# Patient Record
Sex: Female | Born: 1975 | Race: Black or African American | Hispanic: No | Marital: Single | Smoking: Never smoker
Health system: Southern US, Community
[De-identification: ages and names within clinical notes are randomized; demographics above are authoritative.]

## PROBLEM LIST (undated history)

## (undated) DIAGNOSIS — T7840XA Allergy, unspecified, initial encounter: Secondary | ICD-10-CM

## (undated) DIAGNOSIS — K219 Gastro-esophageal reflux disease without esophagitis: Secondary | ICD-10-CM

## (undated) DIAGNOSIS — R0600 Dyspnea, unspecified: Secondary | ICD-10-CM

## (undated) DIAGNOSIS — Z8679 Personal history of other diseases of the circulatory system: Secondary | ICD-10-CM

## (undated) DIAGNOSIS — M329 Systemic lupus erythematosus, unspecified: Secondary | ICD-10-CM

## (undated) DIAGNOSIS — E785 Hyperlipidemia, unspecified: Secondary | ICD-10-CM

## (undated) DIAGNOSIS — N2581 Secondary hyperparathyroidism of renal origin: Secondary | ICD-10-CM

## (undated) DIAGNOSIS — R51 Headache: Secondary | ICD-10-CM

## (undated) DIAGNOSIS — I10 Essential (primary) hypertension: Secondary | ICD-10-CM

## (undated) DIAGNOSIS — M1990 Unspecified osteoarthritis, unspecified site: Secondary | ICD-10-CM

## (undated) DIAGNOSIS — F329 Major depressive disorder, single episode, unspecified: Secondary | ICD-10-CM

## (undated) DIAGNOSIS — F32A Depression, unspecified: Secondary | ICD-10-CM

## (undated) DIAGNOSIS — J189 Pneumonia, unspecified organism: Secondary | ICD-10-CM

## (undated) DIAGNOSIS — M8700 Idiopathic aseptic necrosis of unspecified bone: Secondary | ICD-10-CM

## (undated) DIAGNOSIS — N186 End stage renal disease: Secondary | ICD-10-CM

## (undated) DIAGNOSIS — Z992 Dependence on renal dialysis: Secondary | ICD-10-CM

## (undated) DIAGNOSIS — F419 Anxiety disorder, unspecified: Secondary | ICD-10-CM

## (undated) DIAGNOSIS — I639 Cerebral infarction, unspecified: Secondary | ICD-10-CM

## (undated) DIAGNOSIS — D649 Anemia, unspecified: Secondary | ICD-10-CM

## (undated) HISTORY — PX: OTHER SURGICAL HISTORY: SHX169

## (undated) HISTORY — PX: INCISION AND DRAINAGE ABSCESS: SHX5864

## (undated) HISTORY — PX: AV FISTULA PLACEMENT: SHX1204

## (undated) HISTORY — DX: Allergy, unspecified, initial encounter: T78.40XA

## (undated) HISTORY — DX: Gastro-esophageal reflux disease without esophagitis: K21.9

---
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
## 2001-08-13 ENCOUNTER — Other Ambulatory Visit: Admission: RE | Admit: 2001-08-13 | Discharge: 2001-08-13 | Payer: Self-pay | Admitting: Obstetrics and Gynecology

## 2002-12-09 ENCOUNTER — Emergency Department (HOSPITAL_COMMUNITY): Admission: EM | Admit: 2002-12-09 | Discharge: 2002-12-09 | Payer: Self-pay | Admitting: Emergency Medicine

## 2002-12-25 ENCOUNTER — Emergency Department (HOSPITAL_COMMUNITY): Admission: EM | Admit: 2002-12-25 | Discharge: 2002-12-25 | Payer: Self-pay | Admitting: Emergency Medicine

## 2002-12-25 ENCOUNTER — Encounter: Payer: Self-pay | Admitting: Emergency Medicine

## 2003-11-10 ENCOUNTER — Inpatient Hospital Stay (HOSPITAL_COMMUNITY): Admission: EM | Admit: 2003-11-10 | Discharge: 2003-12-01 | Payer: Self-pay | Admitting: Emergency Medicine

## 2003-11-11 ENCOUNTER — Encounter: Payer: Self-pay | Admitting: Cardiology

## 2003-11-25 ENCOUNTER — Encounter: Payer: Self-pay | Admitting: Internal Medicine

## 2003-12-01 ENCOUNTER — Inpatient Hospital Stay (HOSPITAL_COMMUNITY)
Admission: RE | Admit: 2003-12-01 | Discharge: 2004-01-04 | Payer: Self-pay | Admitting: Physical Medicine & Rehabilitation

## 2003-12-30 ENCOUNTER — Encounter: Payer: Self-pay | Admitting: Physical Medicine & Rehabilitation

## 2004-01-04 ENCOUNTER — Inpatient Hospital Stay (HOSPITAL_COMMUNITY): Admission: EM | Admit: 2004-01-04 | Discharge: 2004-01-14 | Payer: Self-pay | Admitting: Internal Medicine

## 2004-01-20 ENCOUNTER — Inpatient Hospital Stay (HOSPITAL_COMMUNITY): Admission: EM | Admit: 2004-01-20 | Discharge: 2004-02-05 | Payer: Self-pay | Admitting: Emergency Medicine

## 2004-03-27 ENCOUNTER — Ambulatory Visit (HOSPITAL_COMMUNITY): Admission: RE | Admit: 2004-03-27 | Discharge: 2004-03-27 | Payer: Self-pay | Admitting: Rheumatology

## 2004-04-13 ENCOUNTER — Encounter: Admission: RE | Admit: 2004-04-13 | Discharge: 2004-04-13 | Payer: Self-pay | Admitting: Rheumatology

## 2004-06-13 ENCOUNTER — Other Ambulatory Visit: Admission: RE | Admit: 2004-06-13 | Discharge: 2004-06-13 | Payer: Self-pay | Admitting: Internal Medicine

## 2004-07-05 ENCOUNTER — Inpatient Hospital Stay (HOSPITAL_COMMUNITY): Admission: EM | Admit: 2004-07-05 | Discharge: 2004-07-23 | Payer: Self-pay | Admitting: Emergency Medicine

## 2004-07-06 ENCOUNTER — Encounter (INDEPENDENT_AMBULATORY_CARE_PROVIDER_SITE_OTHER): Payer: Self-pay | Admitting: Cardiology

## 2004-07-06 ENCOUNTER — Encounter (INDEPENDENT_AMBULATORY_CARE_PROVIDER_SITE_OTHER): Payer: Self-pay | Admitting: Specialist

## 2004-07-14 ENCOUNTER — Encounter (INDEPENDENT_AMBULATORY_CARE_PROVIDER_SITE_OTHER): Payer: Self-pay | Admitting: *Deleted

## 2004-08-02 ENCOUNTER — Ambulatory Visit: Payer: Self-pay | Admitting: Internal Medicine

## 2004-09-06 ENCOUNTER — Encounter: Admission: RE | Admit: 2004-09-06 | Discharge: 2004-09-06 | Payer: Self-pay | Admitting: Thoracic Surgery

## 2004-11-27 ENCOUNTER — Ambulatory Visit (HOSPITAL_COMMUNITY): Admission: RE | Admit: 2004-11-27 | Discharge: 2004-11-27 | Payer: Self-pay | Admitting: Nephrology

## 2004-11-29 ENCOUNTER — Ambulatory Visit (HOSPITAL_COMMUNITY): Admission: RE | Admit: 2004-11-29 | Discharge: 2004-11-29 | Payer: Self-pay | Admitting: Nephrology

## 2005-01-09 ENCOUNTER — Encounter (INDEPENDENT_AMBULATORY_CARE_PROVIDER_SITE_OTHER): Payer: Self-pay | Admitting: *Deleted

## 2005-01-09 ENCOUNTER — Inpatient Hospital Stay (HOSPITAL_COMMUNITY): Admission: RE | Admit: 2005-01-09 | Discharge: 2005-01-11 | Payer: Self-pay | Admitting: Urology

## 2005-04-25 ENCOUNTER — Encounter (HOSPITAL_COMMUNITY): Admission: RE | Admit: 2005-04-25 | Discharge: 2005-07-24 | Payer: Self-pay | Admitting: Nephrology

## 2005-07-27 ENCOUNTER — Encounter (HOSPITAL_COMMUNITY): Admission: RE | Admit: 2005-07-27 | Discharge: 2005-10-25 | Payer: Self-pay | Admitting: Nephrology

## 2005-08-22 IMAGING — XA IR CV CATH FLUORO GUIDE
1 series · 2 of 2 positions shown · non-contrast
Comparison: none

CLINICAL DATA: Lupus, sepsis, needs access. 
 UPPER EXTREMITY PICC PLACEMENT WITH ULTRASOUND AND FLUORO GUIDANCE
TECHNIQUE: The right arm was prepped with Betadine, draped in the usual sterile fashion, and infiltrated locally with 1% lidocaine.  Ultrasound demonstrated patency of the basilic vein.  Under real-time ultrasound guidance, this vein was accessed with a 21-gauge micropuncture needle.  Ultrasound image documentation was performed.  The needle was exchanged over a guidewire for a peel-away sheath, through which a 5 french double lumen PICC catheter trimmed to 41 cm was advanced, positioned with its tip at the distal SVC/right atrial junction.  Fluoroscopy during the procedure and fluoro spot radiograph confirms appropriate catheter position.  The catheter was flushed, secured to the skin with Prolene sutures, and covered with a sterile dressing.   No immediate complications.  
 IMPRESSION
 Technically successful right arm PICC placement with ultrasound and fluoroscopic guidance.  Ready for routine use.

[Series 1: run · 2 of 2 slices shown]
[im 1/2]
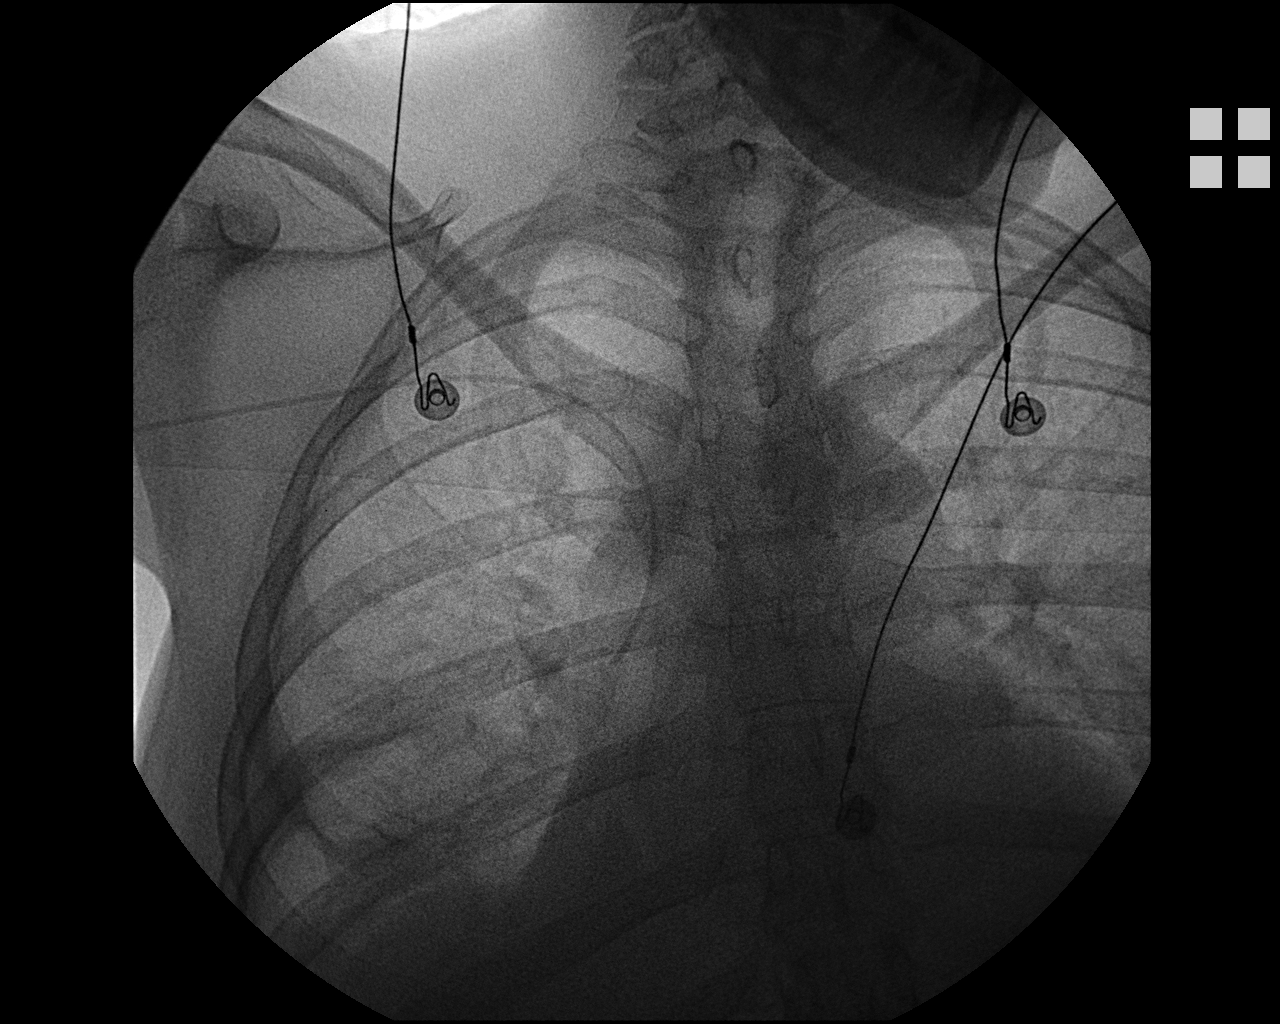
[im 2/2]
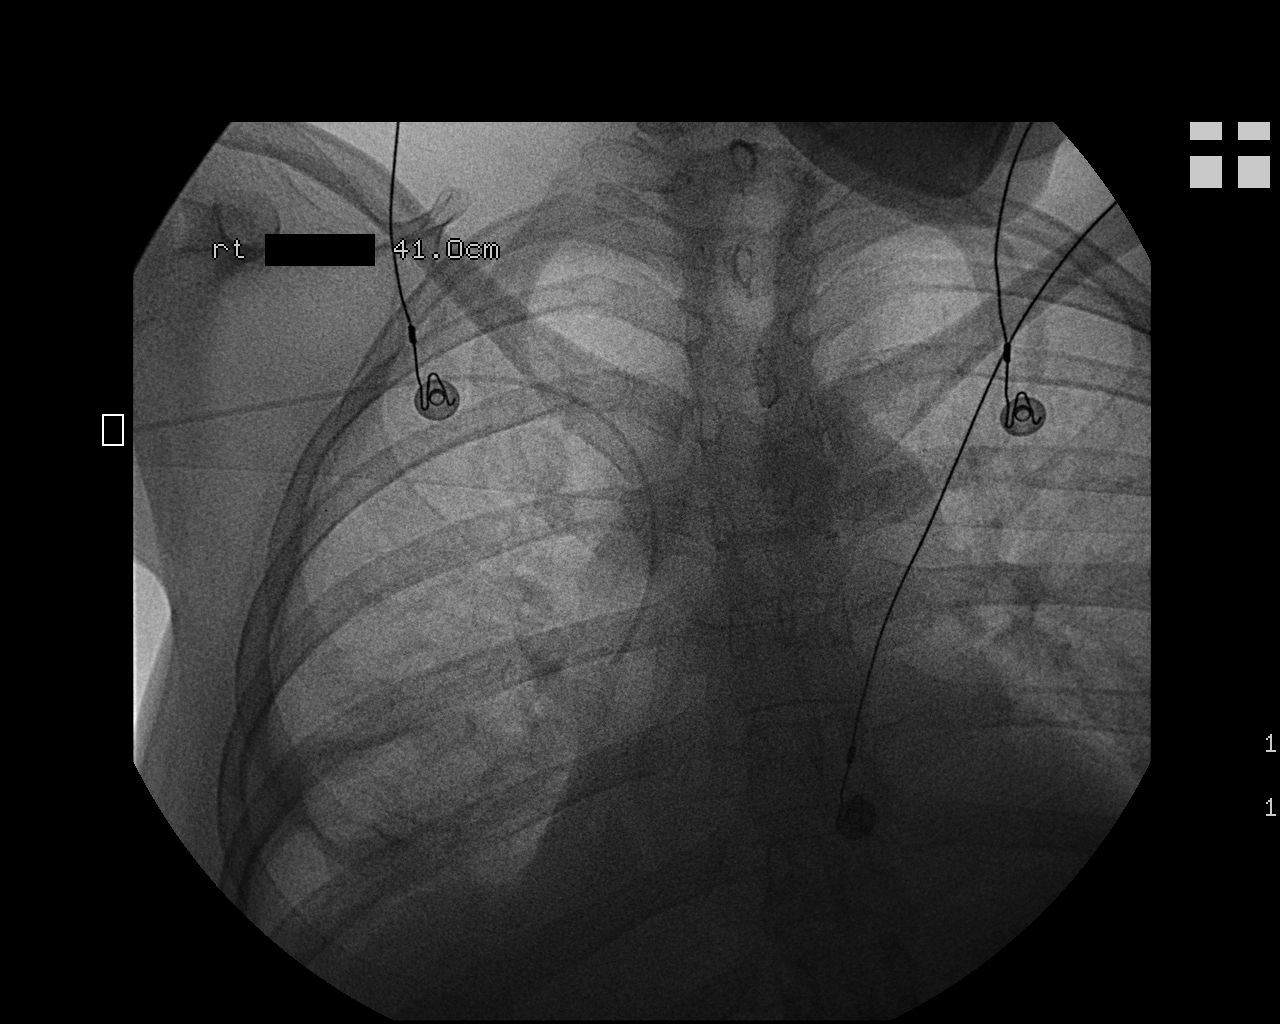

[2 of 2 positions shown; findings below may reference images not displayed]

## 2005-08-25 IMAGING — CR DG CHEST 2V
2 series · 2 of 2 positions shown · non-contrast
Comparison: 12/30/03.

CLINICAL DATA: History of fever.  Pneumonia.  Lupus. 
PA AND LATERAL CHEST FOLLOWED BY BILATERAL DECUBITUS VIEWS

[view not recorded (1 of 2)]
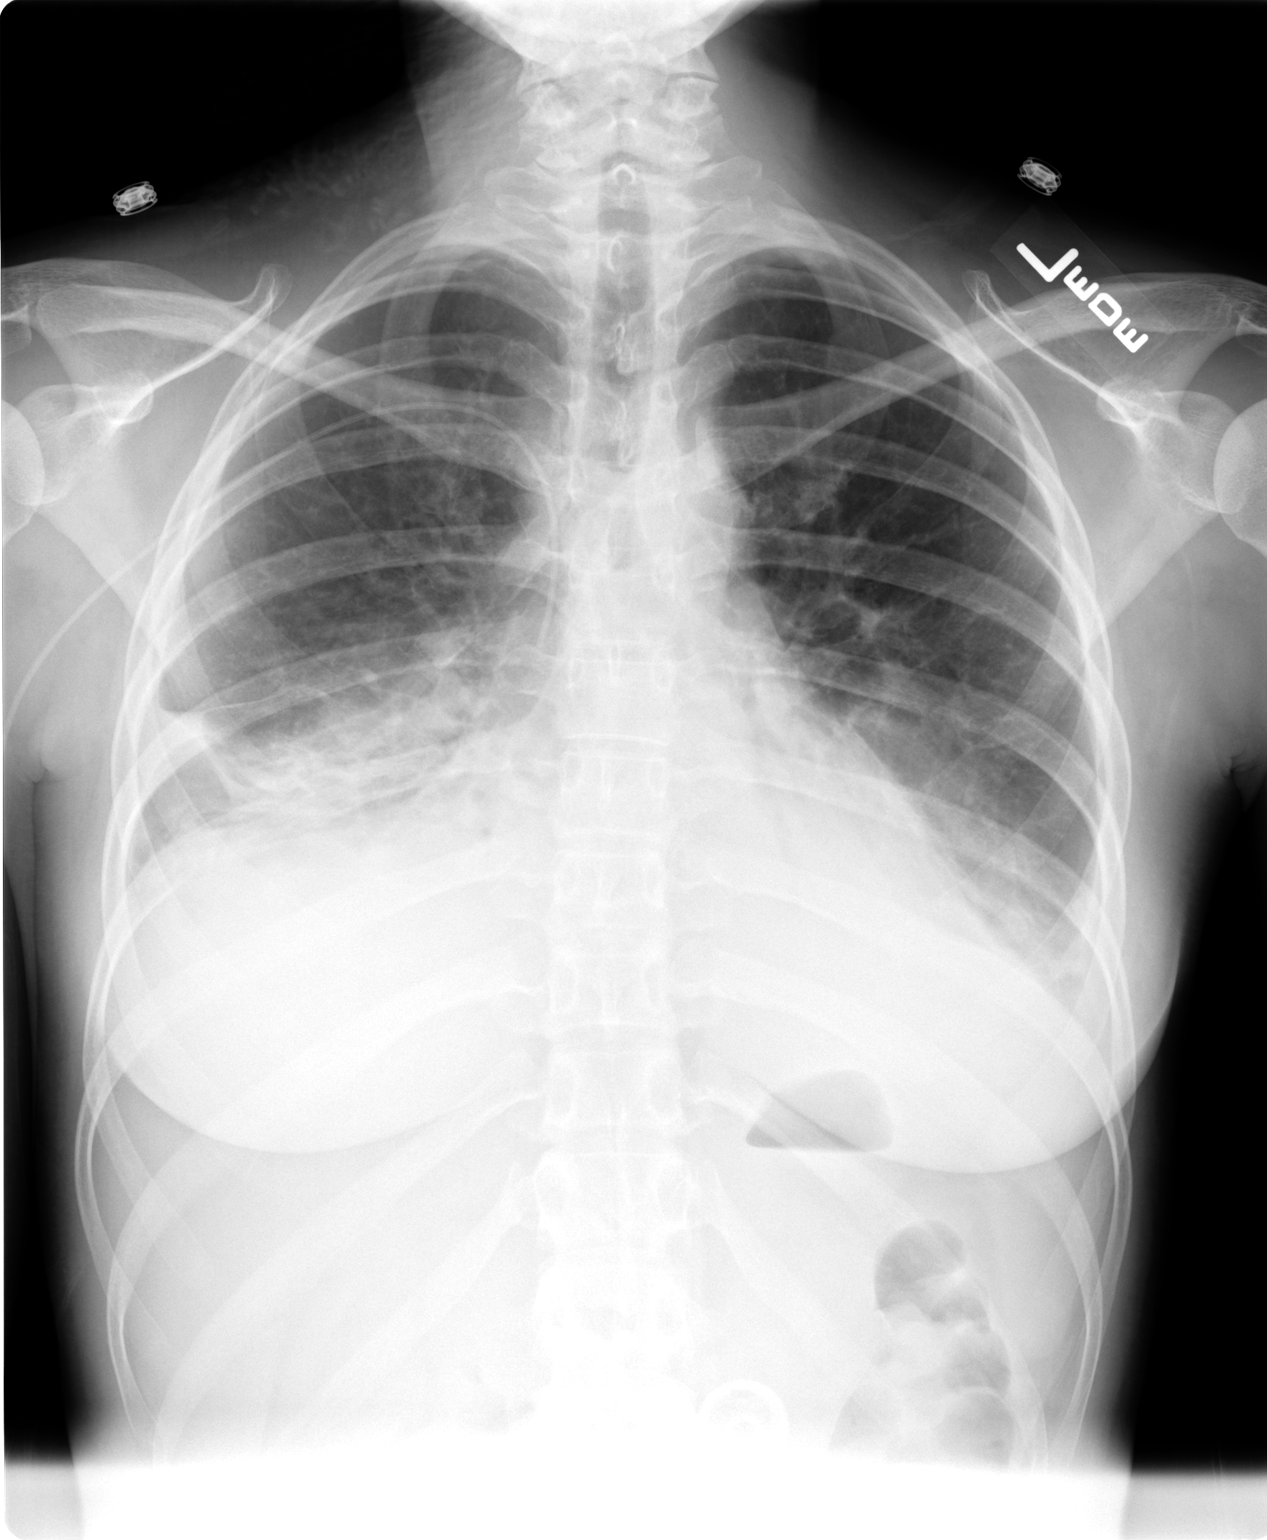

[view not recorded (2 of 2)]
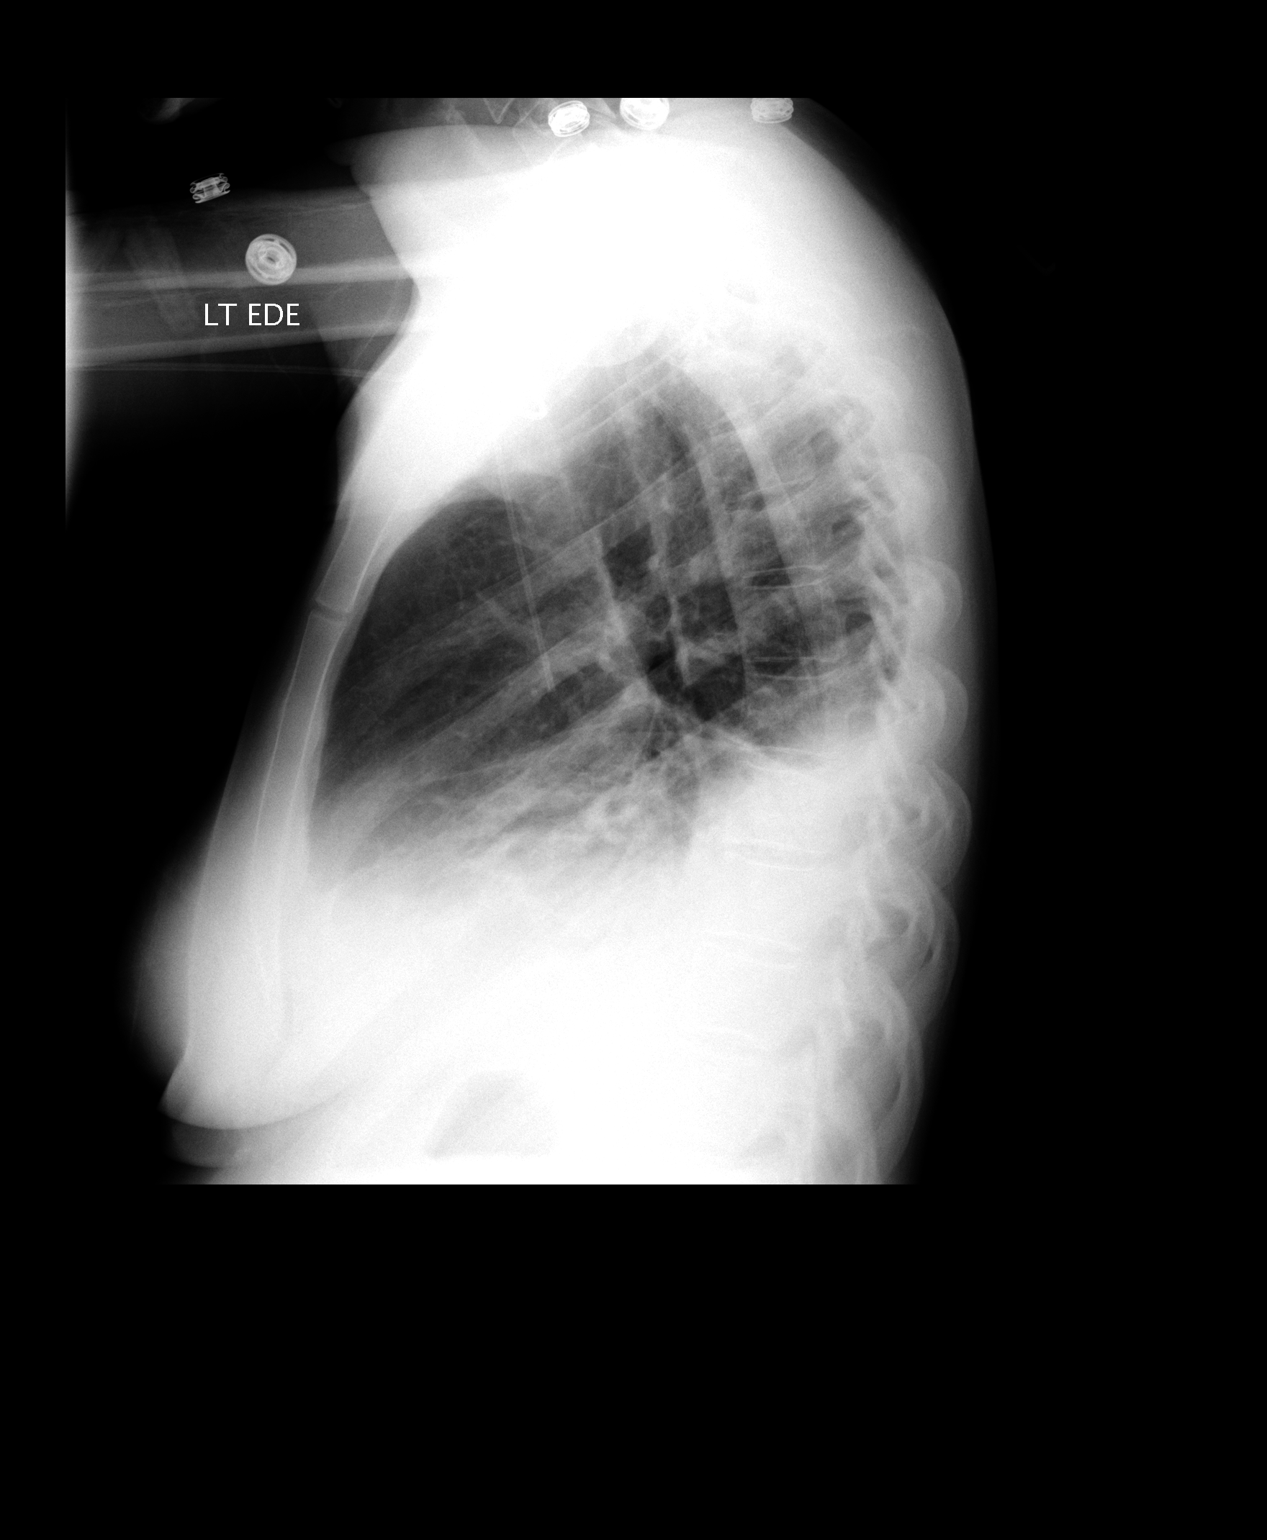

[2 of 2 positions shown; findings below may reference images not displayed]

Bilateral pleural effusions and basilar atelectatic changes/infiltrates.  Pulmonary vascular prominence.  On the decubitus view, there is free layering of the pleural effusions.  Right PICC line tip superior vena cava/right atrial junction.    The heart is enlarged.  Given the history of lupus, pericardial effusion cannot be excluded.
IMPRESSION
Bilateral moderate sized pleural effusions freely layering on the decubitus views.  Bibasilar atelectatic changes/infiltrates. 
Cardiomegaly and mild pulmonary vascular prominence.

## 2005-11-07 ENCOUNTER — Encounter (HOSPITAL_COMMUNITY): Admission: RE | Admit: 2005-11-07 | Discharge: 2006-02-05 | Payer: Self-pay | Admitting: Nephrology

## 2006-02-07 ENCOUNTER — Encounter (HOSPITAL_COMMUNITY): Admission: RE | Admit: 2006-02-07 | Discharge: 2006-05-08 | Payer: Self-pay | Admitting: Nephrology

## 2006-05-13 ENCOUNTER — Encounter (HOSPITAL_COMMUNITY): Admission: RE | Admit: 2006-05-13 | Discharge: 2006-08-07 | Payer: Self-pay | Admitting: Nephrology

## 2006-07-28 ENCOUNTER — Emergency Department (HOSPITAL_COMMUNITY): Admission: EM | Admit: 2006-07-28 | Discharge: 2006-07-28 | Payer: Self-pay | Admitting: Emergency Medicine

## 2006-10-23 ENCOUNTER — Encounter (HOSPITAL_COMMUNITY): Admission: RE | Admit: 2006-10-23 | Discharge: 2007-01-21 | Payer: Self-pay | Admitting: Nephrology

## 2007-11-03 ENCOUNTER — Inpatient Hospital Stay (HOSPITAL_COMMUNITY): Admission: EM | Admit: 2007-11-03 | Discharge: 2007-11-05 | Payer: Self-pay | Admitting: Emergency Medicine

## 2007-11-04 ENCOUNTER — Encounter (INDEPENDENT_AMBULATORY_CARE_PROVIDER_SITE_OTHER): Payer: Self-pay | Admitting: Surgery

## 2008-01-22 ENCOUNTER — Encounter: Admission: RE | Admit: 2008-01-22 | Discharge: 2008-01-22 | Payer: Self-pay | Admitting: Internal Medicine

## 2008-02-04 ENCOUNTER — Other Ambulatory Visit: Admission: RE | Admit: 2008-02-04 | Discharge: 2008-02-04 | Payer: Self-pay | Admitting: Internal Medicine

## 2008-04-15 ENCOUNTER — Ambulatory Visit (HOSPITAL_COMMUNITY): Admission: RE | Admit: 2008-04-15 | Discharge: 2008-04-15 | Payer: Self-pay | Admitting: Obstetrics and Gynecology

## 2008-05-25 ENCOUNTER — Encounter (HOSPITAL_COMMUNITY): Admission: RE | Admit: 2008-05-25 | Discharge: 2008-08-23 | Payer: Self-pay | Admitting: Nephrology

## 2008-07-08 ENCOUNTER — Encounter: Admission: RE | Admit: 2008-07-08 | Discharge: 2008-07-08 | Payer: Self-pay | Admitting: Rheumatology

## 2008-07-27 ENCOUNTER — Encounter: Admission: RE | Admit: 2008-07-27 | Discharge: 2008-07-27 | Payer: Self-pay | Admitting: Rheumatology

## 2008-09-01 ENCOUNTER — Encounter (HOSPITAL_COMMUNITY): Admission: RE | Admit: 2008-09-01 | Discharge: 2008-11-18 | Payer: Self-pay | Admitting: Nephrology

## 2008-09-08 ENCOUNTER — Inpatient Hospital Stay (HOSPITAL_COMMUNITY): Admission: RE | Admit: 2008-09-08 | Discharge: 2008-09-09 | Payer: Self-pay | Admitting: Orthopedic Surgery

## 2008-12-03 ENCOUNTER — Encounter (HOSPITAL_COMMUNITY): Admission: RE | Admit: 2008-12-03 | Discharge: 2009-03-03 | Payer: Self-pay | Admitting: Nephrology

## 2010-02-16 ENCOUNTER — Emergency Department (HOSPITAL_COMMUNITY): Admission: EM | Admit: 2010-02-16 | Discharge: 2010-02-16 | Payer: Self-pay | Admitting: Emergency Medicine

## 2010-02-16 ENCOUNTER — Encounter (HOSPITAL_COMMUNITY): Admission: RE | Admit: 2010-02-16 | Discharge: 2010-04-26 | Payer: Self-pay | Admitting: Nephrology

## 2010-12-09 ENCOUNTER — Encounter: Payer: Self-pay | Admitting: Nephrology

## 2010-12-09 ENCOUNTER — Encounter: Payer: Self-pay | Admitting: Physical Medicine & Rehabilitation

## 2010-12-09 ENCOUNTER — Encounter: Payer: Self-pay | Admitting: Critical Care Medicine

## 2010-12-09 ENCOUNTER — Encounter: Payer: Self-pay | Admitting: *Deleted

## 2010-12-10 ENCOUNTER — Encounter: Payer: Self-pay | Admitting: Thoracic Surgery

## 2011-03-05 LAB — IRON AND TIBC: Iron: 71 ug/dL (ref 42–135)

## 2011-03-05 LAB — POCT HEMOGLOBIN-HEMACUE
Hemoglobin: 11.2 g/dL — ABNORMAL LOW (ref 12.0–15.0)
Hemoglobin: 10.8 g/dL — ABNORMAL LOW (ref 12.0–15.0)

## 2011-03-06 LAB — POCT HEMOGLOBIN-HEMACUE: Hemoglobin: 10.7 g/dL — ABNORMAL LOW (ref 12.0–15.0)

## 2011-04-03 NOTE — Op Note (Signed)
[name], [name]            ACCOUNT NO.:  [id number]   MEDICAL RECORD NO.:  [id number]          PATIENT TYPE:  INP   LOCATION:  5528                         FACILITY:  [facility]   PHYSICIAN:  Earnstine Regal, MD      DATE OF BIRTH:  [birth year]   DATE OF PROCEDURE:  11/04/2007  DATE OF DISCHARGE:                               OPERATIVE REPORT   PREOPERATIVE DIAGNOSIS:  Ulcerated wound, abscess, right buttock.   POSTOPERATIVE DIAGNOSIS:  Ulcerated wound, abscess, right buttock.   PROCEDURE:  Debridement of skin and subcutaneous tissue, ulcerated wound  right buttock, drainage and open packing of underlying abscess.   SURGEON:  Earnstine Regal, MD, FACS   ANESTHESIA:  General.   ESTIMATED BLOOD LOSS:  Minimal.   PREPARATION:  Betadine.   COMPLICATIONS:  None.   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  She has developed an ulcerated  wound which is draining on the right buttock.  The patient now comes to  surgery for debridement and drainage.   BODY OF REPORT:  Procedure is done in OR #17 at the [place].  The patient is brought to the operating room and  following induction of general anesthesia, turned to a prone position on  the operating room table.  After ascertaining that an adequate level  anesthesia been obtained, the site on the posterior upper right buttock  is prepped and draped in the usual strict aseptic fashion.  After  ascertaining that an adequate level of anesthesia been achieved, the 4  cm ulcerated wound is excised using the electrocautery for hemostasis.  Dissection was carried full-thickness through the subcutaneous tissues.  Abscess cavity is opened and debrided.  Tissue is submitted to  pathology.  Aerobic and anaerobic cultures were obtained from the wound  and abscess cavity.  Hemostasis was obtained with electrocautery.  Abscess cavity is packed  with  1-inch Iodoform gauze packing.  Dry gauze dressing followed by ABD  pad are placed as dressings.  The patient is awakened from anesthesia  and brought to the recovery room in stable condition.  The patient  tolerated the procedure well.      Earnstine Regal, MD  Electronically Signed     TMG/MEDQ  D:  11/04/2007  T:  11/04/2007  Job:  [id number]   cc:   Ashby Dawes. Polite, M.D.

## 2011-04-03 NOTE — H&P (Signed)
NAMECARTINA, [name]            ACCOUNT NO.:  [id number]   MEDICAL RECORD NO.:  [id number]          PATIENT TYPE:  INP   LOCATION:  5528                         FACILITY:  [place]   PHYSICIAN:  Ashby Dawes. Polite, M.D. DATE OF BIRTH:  July 03, 1976   DATE OF ADMISSION:  11/03/2007  DATE OF DISCHARGE:                              HISTORY & PHYSICAL   CHIEF COMPLAINT:  Right gluteal abscess.   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  She stated a few days ago it was a pimple, she scratched it, it had  swollen and degraded and the skin has been open. She denies any fever or  chills. Because of the patient's history of chronic immune status and  rapid aggressive nature of this and concern for MRSA, admission is  deemed necessary for further evaluation and treatment and more than  likely surgical I&D.   PAST MEDICAL HISTORY:  Significant for diabetes, hypertension, high  cholesterol, systemic lupus erythematosus.   CURRENT MEDICATIONS:  1. Prednisone 5 mg daily.  2. Lexapro 10 mg daily.  3. Zetia daily.  4. Metformin 500 mg daily.  5. Caduet 5/10 daily.  6. Clonidine 0.3 mg b.i.d.  7. Metoprolol 100 mg b.i.d.  8. Cozaar 100 mg daily.  9. Amlodipine 5 mg daily.  10.Cellcept presumed to be 500 mg b.i.d.  11.Plaquenil 200 mg b.i.d.   SOCIAL HISTORY:  Negative for tobacco, alcohol or drugs.   PAST SURGICAL HISTORY:  Status post kidney biopsy in 2006.   REVIEW OF SYSTEMS:  As stated in the HPI.   FAMILY HISTORY:  Noncontributory.   ALLERGIES:  SULFA.   PHYSICAL EXAMINATION:  The patient is alert and oriented x3.  BP 120/80, weight 150, temperature 98.3.  HEENT:  Unremarkable.  CHEST:  Clear without rales or rhonchi.  CARDIOVASCULAR:  Regular S1, S2.  ABDOMEN:  Soft, nontender.  EXTREMITIES:  The  patient has approximately 5 x 6 cm of induration in  the right gluteal area. Skin is essentially denuded from it. No foul  odor, no drainage. She has 2+ pulses in the extremities.   ASSESSMENT:  1. Right gluteal abscess, rule out MRSA.  2. Diabetes.  3. Hypertension.  4. Systemic lupus erythematosus on chronic steroids and      immunosuppressants.   RECOMMENDATIONS:  Patient be admitted to floor. She is fairly stable  now; however, with her immunocompromised state felt that admit and  surgical consultation is warranted sooner than later. The patient will  be started on empiric antibiotics and will be pan cultured. Will obtain  other screening labs as well.      Ashby Dawes. Polite, M.D.  Electronically Signed     RDP/MEDQ  D:  11/03/2007  T:  11/03/2007  Job:  [id number]

## 2011-04-03 NOTE — Op Note (Signed)
NAMEELISSA, [name]            ACCOUNT NO.:  [id number]   MEDICAL RECORD NO.:  [id number]          PATIENT TYPE:  AMB   LOCATION:  [place]                           FACILITY:  [facility]   PHYSICIAN:  [name], MD          DATE OF BIRTH:  1976/05/26   DATE OF PROCEDURE:  DATE OF DISCHARGE:                               OPERATIVE REPORT   PREOPERATIVE DIAGNOSIS:  Vaginal intraepithelial neoplasia, VAIN I.   POSTOPERATIVE DIAGNOSIS:  Vaginal intraepithelial neoplasia, VAIN I.   PROCEDURE:  CO2 laser vaporization of vaginal intraepithelial neoplasia  I and condyloma.   SURGEON:  [name], MD   ASSISTANT:  None.   ANESTHESIA:  General.   FINDINGS:  Acetowhite lesions were noted after application of Lugol's  involving the anterior vaginal wall and cervix and posterior vaginal  wall, left lateral vaginal wall at the connection to the cervix.   SPECIMEN:  None.   ESTIMATED BLOOD LOSS:  Minimal.   COMPLICATIONS:  None.   INDICATION:  A 35 year old with biopsy-proven VAIN I and condyloma with  immunosuppression secondary to lupus desired therapy with CO2 laser  vaporization.   PROCEDURE:  The patient was taken to the operating room where she was  placed in general anesthesia.  She was placed in dorsal lithotomy  position, prepped and draped in the usual sterile fashion.  Bivalve  speculum was placed into the vaginal vault and connected to suction  acetic acid was applied to the cervix and vaginal wall followed by  Lugol's with the findings noted above.  CO2 laser vaporization was  performed at 10 watts of power to a depth of approximately 5-mm of the  anterior cervix and 5-mm to the posterior cervix.  The lesions were  vaporized to a depth of approximately 2-3 mm, excellent hemostasis was  noted. Silvadene cream was placed onto the vaginal vault.  The speculum  was removed from the patient's vagina.  The patient was awakened from  anesthesia and taken to the recovery room awake and  in stable condition.     [name], MD  Electronically Signed    TC/MEDQ  D:  04/15/2008  T:  04/16/2008  Job:  [id number]

## 2011-04-03 NOTE — Op Note (Signed)
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   PROCEDURE:  Left foot tibiocalcaneal fusion.   SURGEON:  Newt Minion, MD   ANESTHESIA:  General plus popliteal block.   ESTIMATED BLOOD LOSS:  Minimal.   ANTIBIOTICS:  Kefzol 1 g.   DRAINS:  None.   COMPLICATIONS:  None.   TOURNIQUET TIME:  Esmarch at the calf for approximately 49 minutes.   DISPOSITION:  To PACU in stable condition.   INDICATIONS FOR PROCEDURE:  The patient is a 35 year old woman with  diabetes, lupus, chronic steroid dependency, had chronic AVN of the  tibia, talus and calcaneus.  She has had pain with activities of daily  living.  She does have some mild AVN across the midfoot and forefoot;  however, this is asymptomatic.  Due to her pain with activities of daily  living and failure of conservative care, the patient presents at this  time for left foot tibiocalcaneal fusion.  Risks and benefits were  discussed including infection, neurovascular injury, persistent pain,  need for additional surgery.  The patient states she understands and  wish to proceed at this time.   DESCRIPTION OF PROCEDURE:  The patient was brought to OR room 1 and  underwent a general anesthetic after a popliteal block.  After adequate  level of anesthesia obtained, the patient was placed in the right  lateral decubitus position with the left side up and the left lower  extremity was prepped using DuraPrep and draped into a sterile field,  and lateral incision was made over the fibula.  The distal aspect of the  fibula was resected and this was morselized for bone graft.  The foot  was placed at 90 degrees in the distal aspect of the tibia and talar  dome was resected to allow for 90-degree alignment of the joint with the  foot at 90 degrees.  The subtalar joint was also debrided.  A guidewire  was then inserted from the calcaneus into the tibia.  This was  overreamed with a drill and then was sequentially reamed to 11 mm for a  10-mm nail.  The 10 x 170 mm nail was inserted and a posterior spinal  blade was placed in 60-mm in length.  C-arm fluoroscopy verified  reduction of both AP and lateral planes due to the tight canal fit of  the nail.  Proximal interlocking screw was not provided.  This would  allow for dynamization across the fusion site.  The extra small infuse  bone morphogenic protein  was inserted as well as the morselized fibula  for bone graft.  The wound was irrigated with normal saline prior to the  graft being placed.  The subcutaneous layer was closed using 2-0 Vicryl.  The skin was closed using 2-0 nylon.  The wounds were covered Adaptic,  orthopedic sponges, Kerlix, ABD, and Coban.  The patient was extubated  and taken to PACU in stable condition.      Newt Minion, MD  Electronically Signed     MVD/MEDQ  D:  09/08/2008  T:  09/09/2008  Job:  [id number]

## 2011-04-03 NOTE — Consult Note (Signed)
[name], [name]            ACCOUNT NO.:  [id number]   MEDICAL RECORD NO.:  [id number]          PATIENT TYPE:  INP   LOCATION:  5528                         FACILITY:  [facility]   PHYSICIAN:  Earnstine Regal, MD      DATE OF BIRTH:  [birth year]   DATE OF CONSULTATION:  11/03/2007  DATE OF DISCHARGE:                                 CONSULTATION   REASON FOR CONSULTATION:  A right gluteal abscess.   HISTORY OF PRESENT ILLNESS:  This is a 35 year old, black female with a  history of systemic lupus erythematosus, hypertension, diabetes, and a  chronic anemia.  She presented to Dr. Lina Sar office today with the  complaint of right gluteal pain.  During his exam, an abscess was found.  At this time, she was admitted to the hospital.  When here, she states  that this area on her buttock began about three days ago as a pimple.  She said at this time that she scratched it and it began to swell a  little bit.  She put some alcohol on it to try and help it, which she  feels like it did to some extent.  Over the next couple of days, it  began to get worse and she put a band-aid on it.  When she took the band-  aid off yesterday, the skin unroofed the abscess and she had quite a bit  of drainage out of this.  At this time, she says that it is very tender.  All labs and CT scans at this time are pending.  Dr. Delfina Redwood is going to  start her on IV Vancomycin with a concern for MRSA due to her  immunocompromised state of being on chronic steroids for her SLE.  At  this time, we are consulted for possible surgical intervention for an  I&D.   REVIEW OF SYSTEMS:  See HPI.  She does admit to feeling slightly  feverish over the past couple of days; however, she has not checked her  temperature.   PAST MEDICAL HISTORY:  1. SLE.  2. Hypertension.  3. Diabetes.  4. Chronic anemia.   PAST SURGICAL HISTORY:  A kidney biopsy in 2006.   SOCIAL HISTORY:  Negative for tobacco, alcohol, or drugs.   ALLERGIES:  SULFA.   PRESENT MEDICATIONS:  1. Prednisone 10 mg.  2. Furosemide 80 mg.  3. CellCept 500 mg.  4. Metoprolol 100 mg.  5. Cozaar 100 mg.  6. A one-a-day women's vitamin.  7. Since being admitted to the hospital, she will be started on IV      Vancomycin.   PHYSICAL EXAMINATION:  GENERAL:  This is a well-developed, well-  nourished, 36 year old, black female, who is pleasant and in some mild  distress lying in bed on her left side.  VITAL SIGNS:  Temperature 97.9, pulse 84, respirations 20, blood  pressure 172/126, which she has just been given medication for since she  has not taken it today, and she is 100% on room air.  HEENT:  Head is normocephalic, atraumatic with sclerae noninjected.  NECK:  Supple with no lymphadenopathy  and no thyromegaly, trachea is  midline.  CHEST:  Clear to auscultation bilaterally with no wheezes, rhonchi, or  rales noted.  HEART:  Regular rate and rhythm, normal S1 S2, no murmurs, gallops, or  rubs noted.  ABDOMEN:  Soft, nontender, and nondistended with positive bowel sounds,  no scars, masses, or hernias noted.  SKIN:  A right gluteal abscess is noted with about a 90% amount of the  abscess seen covered with Fluff, and she currently does not have any  copious drainage at this time.  This abscess measures about 5 x 5 x 1.5  cm with about a 20 cm circumferential area of erythema surrounding this  inner abscess.  This is also very tender to palpation.  EXTREMITIES:  Moving all four extremities with no cyanosis, clubbing, or  edema noted.  NEURO:  She is alert and oriented x3, moving all four extremities with  no focal deficits noted.   LABS AND DIAGNOSTICS:  A CBC, CMP, blood cultures, and wound culture are  all pending at this time.  A STAT CT of the pelvis has been ordered and  not performed yet.   IMPRESSION:  1. Right gluteal abscess.  2. Systemic lupus erythematosus for which she is on chronic steroids      and  immunosuppressants.  3. Diabetes mellitus.  4. Hypertension.   PLAN:  At this time, we agree with the beginning of antibiotics.  Because of the immunocompromised state of this patient, it is very  possible that this might be MRSA and feel that Vancomycin is a good  choice to cover this with.  At this time, we are also going to get a  STAT CBC and CMP as well as a STAT CT of the pelvis with oral and IV  contrast.  The CT will help [place] look for any deeper infection that might  be invading the deeper muscles or even the hip joint.  Hopefully this  will not be the case, but the CT will let us know if there is anything  additional to this superficial abscess that is visible.  At this point,  she will also be kept NPO in case the CT scan does come back and shows  some deeper infiltration then it will be possible for her to be taken to  the OR today for surgical debridement if necessary.  If surgical  debridement is not necessary today, the floor will be called back and  the patient will be allowed to eat supper tonight.  At this time, any  further recommendations will be per Dr. Harlow Asa after his evaluation of  the patient.   TMG - Pt seen and evaluated at bedside.  Ulcerated wound on upper right  buttock with significant induration and probable underlying abscess.  IV  abx therapy started.  Will debride and obtain cultures in OR on 12-16-  2008.      Henreitta Cea, PA      Earnstine Regal, MD  Electronically Signed    KEO/MEDQ  D:  11/03/2007  T:  11/04/2007  Job:  [id number]

## 2011-04-03 NOTE — H&P (Signed)
NAMEYEILY, [name] NO.:  [id number]   MEDICAL RECORD NO.:  [id number]          PATIENT TYPE:   LOCATION:  WAMB                          FACILITY:  [facility]   PHYSICIAN:  Mary Louis, MD          DATE OF BIRTH:  April 26, 1976   DATE OF ADMISSION:  04/07/2008  DATE OF DISCHARGE:                              HISTORY & PHYSICAL   The patient is scheduled for surgery on Apr 15, 2008.   HISTORY OF PRESENT ILLNESS:  This is a 35 year old G49 with vaginal  condyloma VAIN 1 and desires therapy with CO2 laser.  The patient had a  colposcopy performed on March 04, 2008, and at that time was noted to  have multiple condylomatous lesions in her vaginal vault.  She had an  ECC performed that was benign, biopsy at 6 o'clock that was benign as  well.  She had a biopsy of her left vaginal wall that showed condyloma  with associated low-grade squamous intraepithelial lesions, VAIN 1.   PAST OBSTETRIC HISTORY:  Negative.   PAST GYNECOLOGIC HISTORY:  Last menstrual period Mar 19, 2008, lasting 3  days,  approximately every 2 to 3 months.   PAST MEDICAL HISTORY:  Lupus, diabetes, thyroid disease, hypertension,  anemia, hypercholesterolemia, and nephritis.   PAST SURGICAL HISTORY:  Incision and drainage of right gluteal abscess,  kidney biopsy.   ALLERGIES:  No known drug allergies.   CURRENT MEDICATIONS:  1. Prednisone 10 mg daily.  2. Clonidine 0.3 mg b.i.d.  3. Furosemide 80 mg b.i.d.  4. Amlodipine 5 mg daily.  5. Zetia 10 mg daily.  6. __________ 500 mg b.i.d.  7. Klor-Con 20 mg daily.  8. Lexapro 10 mg daily.  9. Cozaar 100 mg daily.  10.Metoprolol 100 mg b.i.d.  11.Metformin 500 mg daily.   SOCIAL HISTORY:  The patient is single.  She denies tobacco, alcohol or  illicit drug use.   FAMILY HISTORY:  Negative for breast, ovarian, colon cancer.   REVIEW OF SYSTEMS:  Negative except as stated in history of current  illness.   PHYSICAL EXAMINATION:  VITAL SIGNS:  Blood  pressure 146/90, height 5  feet 4 inches, weight 154 pounds.  GENERAL:  Alert and oriented, in no acute distress.  CARDIOVASCULAR:  Regular rate and rhythm.  LUNGS:  Clear to auscultation bilaterally.  ABDOMEN:  Soft, nontender.  Positive bowel sounds.  EXTREMITIES:  There is 2+ pedal edema, left greater than right.  PELVIC:  Normal external female genitalia and multiple condylomatous  vaginal lesions are noted.  Bimanual exam reveals normal-sized uterus.  No adnexal masses or tenderness.   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  Desires therapy with CO2 laser.  Due to the patient's immunosuppression,  recommend stress-dose steroids prior to surgery.  The risks, benefits  and alternatives of surgery were discussed with the patient, including  infection, bleeding, damage to surrounding organs, need for further  surgery.  The patient voiced understanding of risks and desires to  proceed with CO2 laser therapy of VAIN 1  condyloma.  Mary Louis, MD  Electronically Signed     TC/MEDQ  D:  04/14/2008  T:  04/14/2008  Job:  [id number]

## 2011-04-06 NOTE — H&P (Signed)
[name], [name]            ACCOUNT NO.:  [id number]   MEDICAL RECORD NO.:  [id number]          PATIENT TYPE:  INP   LOCATION:  0003                         FACILITY:  [facility]   PHYSICIAN:  Domingo Pulse, M.D.  DATE OF BIRTH:  1976-02-25   DATE OF ADMISSION:  01/09/2005  DATE OF DISCHARGE:                                HISTORY & PHYSICAL   PREOPERATIVE DIAGNOSES:  1.  Renal insufficiency.  2.  Systemic lupus erythematosus.  3.  Hypertension.  4.  Diabetes.   HISTORY:  This 35 year old female has renal insufficiency due to a  combination of diabetes, hypertension and lupus.  The patient has had some  bleeding dyscrasias and the renal service is concerned about needle biopsy  for fear that they will not be able to control her bleeding.  The patient is  to undergo an open renal biopsy.  There was some consideration given to the  possibility of doing a laparoscopic needle biopsy but given the concern  about bleeding, it was felt that the best option would be to do an open  renal biopsy where the Argon beam coagulator and Tisseel would be available  to control bleeding.  The patient understands the risks and benefits of the  procedure.  She is to be admitted following her open renal biopsy.   PAST MEDICAL HISTORY:  The patient's past medical history is remarkable for  difficult to control hypertension.  She has had problems within the past  year with pneumonia and bilateral pleural effusions.  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  She also  has problems with thrombocytopenia and elevation of her liver enzymes.  She  also is known to have chronic normocytic anemia.  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   CURRENT MEDICATIONS:  The patient's current medications are:  1.  Hydrocodone.  2.  Tylenol.  3.  Potassium 20 mEq daily.  4.  Lipitor 20 mg daily.  5.  Prozac 20 mg daily.  6.  Norvasc 10 mg  daily.  7.  Glipizide 5 mg daily.  8.  Prednisone 20 mg daily.  9.  Atenolol 100 mg daily.  10. Temazepam 30 mg at night.  11. Caduet 5/20 one daily.  12. Azathioprine 50 mg two tablets at night.  13. Lasix 80 mg b.i.d.  14. Catapres patches, one weekly.  15. Cozaar 100 mg daily.   The patient does not use alcohol or tobacco.   FAMILY HISTORY:  She has an unremarkable family history.   PHYSICAL EXAMINATION:  VITAL SIGNS:  On examination today, temperature is  97.1, pulse 84, respirations 18, blood pressure 120/80, weight 144.  HEENT:  Normocephalic, atraumatic.  Cranial nerves II-XII are grossly  intact.  NECK:  Supple.  No adenopathy or thyromegaly.  LUNGS:  Clear.  HEART:  Had a regular rate and rhythm.  No murmurs, thrills, gallops, rubs  or heaves.  ABDOMEN:  Soft and nontender with no palpable masses, rebound or guarding.  EXTREMITIES:  No cyanosis, clubbing or edema.  NEUROLOGICAL:  Appears grossly intact.  PELVIC:  Examination not performed.   IMPRESSION:  Renal insufficiency secondary to systemic lupus erythematosus.   PLAN:  Admit following open renal biopsy.      RJE/MEDQ  D:  01/09/2005  T:  01/09/2005  Job:  [id number]

## 2011-04-06 NOTE — Discharge Summary (Signed)
NAME:  [name], [name]                      ACCOUNT NO.:  [id number]   MEDICAL RECORD NO.:  [id number]                   PATIENT TYPE:  INP   LOCATION:  3028                                 FACILITY:  [facility]   PHYSICIAN:  Ara D. Dyann Kief, M.D.                DATE OF BIRTH:  Jan 18, 1976   DATE OF ADMISSION:  11/10/2003  DATE OF DISCHARGE:  12/01/2003                                 DISCHARGE SUMMARY   CONSULTING PHYSICIANS:  1. Pulmonary critical medicine.  2. Lindaann Slough, M.D., rheumatology.  3. Donato Heinz, M.D., renal.  4. Hematology/oncology consults.  5. Rehab consults.   FINAL DIAGNOSES:  1. Pneumococcal sepsis.  2. Acute renal failure requiring CVVHD, resolved.  3. Pancytopenia, slowly resolving.  4. Severe deconditioning.  5. Left upper extremity edema, unknown etiology.  6. Hypertension.  7. Lupus.     a. History of lupus nephritis.  8. 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  9. History of pneumonia in 2003.   FINAL PROCEDURES:  1. MRI and MRA of the chest and upper extremity performed November 30, 2003.     Impression:  No evidence of central left upper extremity venous clot.  No     evidence of left upper extremity DVT at least to the level of the elbow.  2. MRI and MRA of the left lower extremity performed November 28, 2003,     showing widely patent venous flow demonstrating both lower extremities to     the pelvis.  No evidence of active venous thrombosis or stenosis.  3. Abdominal ultrasound performed November 25, 2003, showing gallbladder wall     thickening which is nonspecific and could be related to ascites. There     was no ultrasonographic evidence for acute cholecystitis, small     hemangioma in the right lower lobe of the liver, echogenic renal     parenchyma bilaterally suggests intrinsic renal disease.  4. Portable chest x-ray performed November 24, 2003, showing persistent     infiltrates and effusion.  5. CT of the head without contrast  performed December 12, 2002, showing     negative head CT.  6. CT of the abdomen without contrast performed December 12, 2002, showing     there was a moderate amount of ascites, small bilateral effusions and     stranding in the retroperitoneum, particularly in the perinephric space.     Mild diffuse bowel wall prominences noted, most likely related to     generalized edema, no evidence of bowel obstruction as contrast is passed     into the colon, moderate amount of free fluid is seen in the pelvis,     uterus and adnexa unremarkable.  Right groin arterial line in place.  7. CT of the chest performed November 11, 2003, showing dependent pulmonary     atelectasis and/or consolidation, probable bilateral pleural effusions,     although this is difficult  to separate from the unaerated lung, abnormal     density in the mediastinum likely represents edema.  Some of this does     surround the ascending aorta and there is a remote possibility this could     indication aortic dissection, although I think is not the most likely     explanation.  Endotracheal tube in proximal right main stem bronchus.     Renal ultrasound performed December 11, 2002, showing findings compatible     with nonobstructive renal medical disease, lupus nephritis would be     consideration, ascites and perinephric fluid.  Kidneys normal size with     abnormal increased parenchymal echogenicity, negative for obstruction.  8. A 2-D echocardiogram  performed November 11, 2003, showing left     ventricular ejection fraction at 25% with moderately severe mitral     valvular regurgitation.  The effective orifice of mitral regurgitation by     proximal isovelocity surface area was 0.42 sq cm.  The volume of mitral     regurgitation by proximal isovelocity surface area was 51 mL.  Left     atrium was dilated and there was severe LV dysfunction.  There was     significant MR.  9. A 2-D echocardiogram  performed November 25, 2003,  showing normal LV size     and thickness, normal AV thickness with trivial mitral regurgitation,     normal left atrial size, normal RV size and function, normal pulmonic     size valve and function.  There was mild tricuspid regurgitation.  Right     atrial size was normal with a small pericardial effusion.   PERTINENT LABORATORY DATA:  White blood cells 3.4, H&H 8.8/26 with platelet  count of 160.  Sodium 137, potassium 4.5, chloride 112, CO2 22, BUN 7,  glucose 124, creatinine 0.5, calcium 7.6, magnesium 1.8.  Hemoglobin A1C  5.6.  TSH 3.442, free T4 1.09.  Prealbumin 7.2.  Urinalysis showing too  numerous to count red blood cells, 100% mg/dL protein.  PT 13.5, INR 1.0,  PTT 26.  Fibrinogen 333.  Total bilirubin 0.5, alkaline phosphatase 206, AST  60, ALT 70.  A1C 5.6.  Lactic acid 2.2.  Cholesterol 92, triglycerides 225.  B12 1037, folate 428, ferritin 416.  Cortisol 48.6.  C3 16, C4 2.  Haptoglobin 17.  Hepatitis B surface antigen negative.  Hepatitis B core  antibody IgM negative.  Hepatitis A antibody negative.  Hepatitis C antibody  negative .  HIV nonreactive.  RS less than 20.  Double stranded DNA  positive.  T and HD antibody titer greater than 1 to 640.  Anticardiolipin  IgG 11.  Anticardiolipin IgM 8.  Platelet antibodies were detected.   HOSPITAL COURSE BY SYSTEMS:  The patient is a very pleasant 35 year old  [place] American female with past medical history as listed above who was  admitted on November 10, 2003, as a result of pneumococcal sepsis.   #1 - NEUROPSYCHIATRIC:  There were no signs or symptoms of meningitis,  encephalitis, or CVA.  The patient did not have any in-hospital episodes of  acute mania or psychosis.  Despite the critical nature of her illness, the  patient remained remarkably euthymic.  Once she left the unit with  positive  and very cheerful attitude.   #2 - PULMONARY:  The patient hemodialysis respiratory insufficiency secondary to her  pneumococcal sepsis.  The patient was managed on the  ventilator by pulmonary critical care medicine and  after approximately two  weeks, was extubated.  She required initially a low amount of supplemental  oxygen, however, by the time she was discharged to rehab, the patient was  stable on room air.  Her bilateral pleural effusion was thought to be  secondary to her nonambulatory status as well as low oncotic pressure within  her plasma which is felt that with ambulation and increased nutrition, these  should resolve on their own.   #3 - CARDIOVASCULAR:  The patient did have a fair degree of hypertension  which was initially difficult to control.  However, she was maintained on  clonidine, hydrochlorothiazide, Enalapril, and amlodipine with eventual good  control for her blood pressure.  Considering her history of lupus nephritis  and subsequent acute renal failure during sepsis, it was felt that  aggressive blood pressure control would be paramount in this patient. There  were no episodes of hypotension or hypertension once the patient left the  unit.   #4 - RENAL:  As stated above, the patient did have acute renal failure  associated with pneumococcal sepsis, ultimately requiring two days of CAVHD.  The patient had a left femoral dialysis catheter placed by nephrology and  this was discontinued once the patient's renal function returned improved.  Eventually her creatinine came back to normal limits and she no longer  required aggressive nephrology intervention.  Medications were renally dosed  and nephrotoxins were avoided.  Otherwise currently no active issues.   #5 - GI:  No active issues.   #6 - FLUIDS, ELECTROLYTES, AND NUTRITION:  The patient's IV fluids were  medlocked.  Her potassium, magnesium, and phosphate were monitored and she  was started on tube feeds of Jevity 1.2 at 70 mL an hour.  The bolus  feedings caused the patient to have nausea and abdominal pain so these  were  stopped.  The patient has since failed two separate modified barium swallows  and so for now, will be maintained on continuous tube feeds.   #7 - INFECTIOUS DISEASE:  As stated above, the patient grew out pneumococcus  in her sputum and blood cultures.  She was maintained on broad spectrum  antibiotics and currently shows no signs or symptoms of active infection.  The patient did have low grade fever spikes, however, this is felt to be  secondary to her underlying rheumatologic disease.   #8 - HEMATOLOGY/ONCOLOGY:  The patient does have a history of leukopenia and  anemia, however.  When she was admitted, she was also markedly  thrombocytopenic.  This is most like a mixture secondary to her lupus as  well as low grade DIC or sepsis.  She did require platelet transfusions and  blood transfusions to support her through the critical stage of her illness.  As her condition improved in the unit, her blood product requirements slowly decreased and on the day of discharge the patient had a normal platelet  count.  She did remain anemic and the patient did receive one course of IV  iron via hematology consult.  However, she does remain leukopenic at this  time.  Currently, no active issues.   #9 - ENDOCRINE:  The patient's thyroid function was normal.  She was able to  maintain good corticosteroid output while in the unit.  The patient did have  some mild glucose intolerance secondary to the high dose steroids.  For  this, she was placed on insulin glargine and sliding scale insulin and p.o.  glipizide.   #10 -  RHEUMATOLOGY:  The patient was closely followed by her outpatient  rheumatologist.  She will be maintained on prednisone with a slow taper.  Currently no active issues.   #11 - PROPHYLAXIS:  The patient was placed on a PPI for GI prophylaxis and  started on continuous tube feeds when possible.  Because of her  thrombocytopenia and anemia, the patient's low molecular weight  heparin was  withheld.  Instead, she was given TED hose and CDs for her DVT prophylaxis.   #12 - DISPOSITION:  The patient is deconditioned, however, hopeful for  marked improvement upon discharge to rehab.  She is being discharged to  rehab in improved condition from admission but still in guarded condition.   DISCHARGE MEDICATIONS:  1. Folate 2 mg p.o. daily.  2. Clonidine 0.2 mg p.o. b.i.d.  3. Lansoprazole 30 mg p.o. daily.  4. Chloraseptic spray to bedside.  5. Hydrochlorothiazide 25 mg p.o. daily.  6. Sliding scale insulin.  7. Prednisone 40 mg p.o. daily.  8. Enalapril 10 mg p.o. b.i.d.  9. Amlodipine 5 mg p.o. daily.  10.      Glipizide 5 mg p.o. b.i.d.  11.      Potassium chloride 40 mEq p.o. b.i.d.  12.      Jevity 1.2 70 mL per hour.  13.      Free water 50 mL q.4h.   DISCHARGE INSTRUCTIONS:  1. The patient is to take her medications as prescribed.  2. She is to comply with rehab and with their recommendations.                                                Ara D. Dyann Kief, M.D.    ADM/MEDQ  D:  12/01/2003  T:  12/02/2003  Job:  [id number]   cc:   Lindaann Slough, M.D.

## 2011-04-06 NOTE — Discharge Summary (Signed)
[name], [name]            ACCOUNT NO.:  [id number]   MEDICAL RECORD NO.:  [id number]          PATIENT TYPE:  INP   LOCATION:  5528                         FACILITY:  [facility]   PHYSICIAN:  Ashby Dawes. Polite, M.D. DATE OF BIRTH:  08/05/1976   DATE OF ADMISSION:  11/03/2007  DATE OF DISCHARGE:  11/05/2007                               DISCHARGE SUMMARY   DISCHARGE DIAGNOSIS:  1. Right gluteal abscess status post incision and drainage by surgery,      probable methicillin-resistant Staphylococcus aureus.  However, the      patient was intolerant of vancomycin and doxycycline while in the      hospital, had reactions to both medications intravenous.  The      patient had been on Zosyn in the hospital, which she tolerated.      Final cultures pending at the time of disposition.  2. Lupus.  3. Hypertension.  4. Diabetes.   DISCHARGE MEDICATIONS:  Augmentin 825 b.i.d.  The patient to resume home  meds, prednisone, Lexapro, Zetia, metformin, Caduet, clonidine,  metoprolol, Cozaar, amlodipine, CellCept, and __________.  She will also  be sent home on Percocet p.r.n. for pain.   DISPOSITION:  She is discharged home in stable condition.  Home Health  wound care has been arranged.  The patient will require b.i.d. packing  of her wound.  She is recommended to have outpatient followup with a  surgery and then primary MD.   CONSULTANTS:  General Surgeon:  Dr. Harlow Asa.   PROCEDURE:  The patient underwent I and D of abscess on November 04, 2007.   DATA:  Final culture pending.  Did show gram positive cocci in pairs.  BMET within normal limits.  CBC within normal limits.  The patient  remained afebrile.   HISTORY OF PRESENT ILLNESS:  A 35 year old female, known history of  lupus, presents to the office with complaint of a rapidly progressing  abscess in the right gluteal area.  The patient was directly admitted to  the hospital for further evaluation and treatment.  Please see  dictated  H and P for further details.   PAST MEDICAL HISTORY, MEDICATIONS, SOCIAL HISTORY, PAST SURGICAL  HISTORY:  Refer to admission H and Emm[place]:  The patient was admitted to a medicine floor bed.  Had  screening labs.  Started empirically on IV antibiotics in the form of  vancomycin.  Surgical consult was obtained.  There was an attempt made  at CT of the abdomen and pelvis.  However, the IV infiltrated.  Just got  CT of the abdomen.  Did not show any acute pathology.  The patient did  have mild swelling of that left hand which resolved.  The patient  underwent I and D of the abscess on November 04, 2007 without problem.  However, hospital course was complicated by reaction to the morphine.  She felt short of breath and chest tightness, and same thing with IV  doxycycline.  The rapid growth of the patient's lesion was somewhat  worrisome for MRSA.  However, due to the above reactions, was hesitant  to retry the patient on the above medications.  As the patient was  afebrile, had I and D of the wound, case was discussed with ID.  If the  patient does become febrile, have significantly elevated white count,  will consider linezolid 600 mg q.12.  At this time, cultures have been  pending.  The patient has been discharged on Augmentin.  Will have  outpatient followup and follow up with cultures with the patient.   Thank you in advance.      Ashby Dawes. Polite, M.D.  Electronically Signed     RDP/MEDQ  D:  11/07/2007  T:  11/08/2007  Job:  [id number]

## 2011-04-06 NOTE — H&P (Signed)
NAME:  [name], [name]                      ACCOUNT NO.:  [id number]   MEDICAL RECORD NO.:  [id number]                   PATIENT TYPE:  INP   LOCATION:  3017                                 FACILITY:  [facility]   PHYSICIAN:  Ara D. Dyann Kief, M.D.                DATE OF BIRTH:  13-Mar-1976   DATE OF ADMISSION:  01/04/2004  DATE OF DISCHARGE:                                HISTORY & PHYSICAL   PRIMARY CARE PHYSICIAN:  Lindaann Slough, M.D.   CHIEF COMPLAINT:  Recurrent fevers.   HISTORY OF PRESENT ILLNESS:  The patient is a very pleasant 35 year old  African-American female who is well known to me ever since she left the unit  in November 26, 2003 after suffering from pneumococcal sepsis.  Since that  time, the patient had been transferred to the rehab service on December 01, 2003, and I have been following her on an almost continuing basis since  then.  The patient had improved dramatically in terms of her strength and  stamina; however, her rehab course had been complicated by recurrent fevers  which had been present since her transfer from the unit in early January,  2005.  Despite her obvious clinical improvement, her fevers remain a source  of concern, given the fact that she still has under-treated lupus with some  degree of leukopenia.  She is on prednisone and especially in deference to  her episode of pneumococcal sepsis, which occurred in December, 2004, which  landed her in the medical intensive care unit for over two weeks on life  support.  Currently, the patient is much improved and is being transferred  to the medical service for further evaluation of her fevers.   ALLERGIES:  1. Sulfa.  2. Iron pills.   PAST MEDICAL HISTORY:  1. Lupus.     A. Leukopenia.     B. Nephritis.     C. Positive ANA.     D. Positive double-stranded DNA.     E. 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  2. Status post pneumococcal sepsis, 10/2003.  3. History of acute renal failure requiring  CVVHD.  4. Severe deconditioning as a result of sepsis with marked improvement.  5. Status post PEG tube placement, December 23, 2003.   SOCIAL HISTORY:  The patient denies tobacco, alcohol, illicit or IV drug  use.  She is currently engaged and is unemployed.   FAMILY HISTORY:  Father deceased at 56 of unknown reasons.  Mother deceased  at age 3 secondary to MI.  Brother alive and healthy.  Sister with asthma.   REVIEW OF SYSTEMS:  Positive for fevers, sweats, abdominal pain secondary to  PEG tube placement.  Resolving upper and lower extremity edema.  All other  systems are negative.   PHYSICAL EXAMINATION:  VITAL SIGNS:  Temperature 98.2, pulse 99,  respirations 20, BP 150/87, pulse ox 97% on room air.  GENERAL:  The patient does  not appear to be in any pain.  A well-developed  and well-nourished 35 year old African-American female with marked  improvement since transfer from the unit and following her stay in rehab in  no apparent distress.  Speaking full sentences, although with a hoarse  voice.  HEENT:  Head is normocephalic and atraumatic without alopecia.  Eyes:  Pupils are equal, round and reactive to light.  Extraocular muscles are  intact.  Anicteric without injection or discharge.  There are normal-  appearing conjunctivae.  Nose:  There is no dried blood or necrotic debris  within the nares.  Mouth:  Somewhat dry mucous membranes.  Uvula is midline.  Oropharynx without erythema or exudate.  Dentition in moderate repair.  NECK:  Supple without meningeal signs.  There is no lymphadenopathy,  thyromegaly, or JVD.  LUNGS:  Persistent E-A changes in the right middle lobe with tubular breath  sounds transmitted, consistent with her persistent effusion.  There are no  wheezes appreciated.  HEART:  Regular rate and rhythm.  S1 and S2.  ABDOMEN:  Nondistended.  Bowel sounds present.  G-tube site clean, dry, and  intact.  There is no guarding, rebound, or hepatosplenomegaly.   EXTREMITIES:  There is no clubbing or cyanosis.  There is markedly improved  extremity edema when compared to prior physical exam.  NEUROLOGIC:  Cranial nerves II-XII are grossly intact.  Strength is 4+/5 in  all extremities in the axial groups (marked improvement upon exiting rehab).  There are no focal or gross motor or sensory deficits appreciated.   ASSESSMENT/PLAN:  A 35 year old African-American female with lupus  complicated by pancytopenia, nephritis with steroid-induced diabetes,  hypertension, severe deconditioning, status post pneumococcal sepsis with  fever of unknown origin.  Problem #1:  Neurologic/psychiatric:  There are no signs or symptoms of  meningitis, encephalitis, or cerebrovascular accident:  Despite the  patient's devastating and prolonged hospital course and her long road to  recovery, she remained markedly euthymic.  Currently, no active issues.  Problem #2:  Pulmonary:  The patient is tolerating room air quite well.  She  does have pleural effusions, which have not been evaluated as a source of  infection; however, they have been present for almost two months.  This is  an unlikely cause of her fevers; however, should it become necessary to rule  out all known causes of infection, we will order decubitus x-rays to  evaluate whether or not they are free-flowing and arrange to have her  pleural fluid sampled.  Problem #3:  Cardiovascular:  Patient does have a moderate degree of  hypertension, likely secondary to her renal disease as well as concomitant  steroid use.  She is currently being maintained on beta blockers, calcium  channel blockers, and ARBs.  We will continue to monitor and also titrate  her medications as necessary and try to condense and simplify them.  Problem #4:  Renal:  Patient has been evaluated by renal, who defers  inpatient biopsies, waiting for the patient to recover and defers this to outpatient.  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  Problem #5:  Gastrointestinal:  Patient has been n.p.o. secondary to her  failing swallow study, and as a result, has  required PEG tube placement in  order to remove an NG tube.  We will re-evaluate the patient's swallowing  study.  It has been noted that the patient has a marked amount of stool on  her KUBs.  We will write the patient for Docusate and Senna and monitor.  Problem #6:  Fluids, electrolytes, nutrition:  We will med-lock the  patient's IV fluids and give her 150 cc of free water by the PEG tube.  We  will monitor her electrolytes and start the patient on one can of Jevity  q.6h.  Problem #7:  Infectious disease:  Patient was pan-cultured repeatedly, and  only recently urine cultures have grown out ampicillin-sensitive  Enterococcus and Staphylococcus saprophyticus.  For this reason, the patient  will be placed on ampicillin/sulbactam, although she is still spiking  through this.  Other options remain CT of the sinuses, CT of the chest,  abdomen, and pelvis as well as draining the pleural fluid and sending for  cultures for further workup of her fevers.  If these all turn out to be  negative, then with the exception of the drug fever, this may remain her  lupus.  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  Problem #8:  Hematology/oncology:  Patient is leukopenic, anemic, and  thrombocytopenic, which fluctuates, most likely secondary to her lupus.  Problem #9:  Endocrine:  We will keep the patient on sliding-scale insulin,  as she has had episodes of hypoglycemia on Glargine.  We will continue to  monitor.  Problem #10:  Prophylaxis:  We will ambulate the patient for DVT  prophylaxis, and we will  feed the patient for GI prophylaxis.  Problem #11:  Disposition:  The patient is a full code.                                                Ara D. Dyann Kief, M.D.    ADM/MEDQ  D:  01/04/2004  T:  01/04/2004  Job:  [id number]   cc:   Lindaann Slough, M.D.

## 2011-04-06 NOTE — Op Note (Signed)
NAME:  [name], [name]                      ACCOUNT NO.:  [id number]   MEDICAL RECORD NO.:  [id number]                   PATIENT TYPE:  INP   LOCATION:  2025                                 FACILITY:  [facility]   PHYSICIAN:  Nicanor Alcon, M.D.              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   SURGEON:  Nicanor Alcon, M.D.   FIRST ASSISTANT:  John Giovanni, P.A.-C.   ANESTHESIA:  General.   DESCRIPTION OF PROCEDURE:  After percutaneous insertion of monitoring lines,  with the patient under general anesthesia, a single lumen tube was inserted  and turned to the left lateral thoracotomy position.  The two trocar sites  remained in the anterior and posterior axillary line and the seventh space,  two trocars were inserted.  Approximately 2000 mL of fluid was evacuated and  sent for culture.  Then using the 0-degree operative scope, multiple pleural  biopsies were obtained which showed an inflammatory reaction, and possible  lupus pleuritis.  Two grams of talc was insufflated and two chest tubes were  placed through the trocar sites and tied in place with 0 silk.  A Marcaine  block was done in the usual fashion.  Dry sterile dressing was applied and  the patient was returned to the recovery room in stable condition.                                               Nicanor Alcon, M.D.    DPB/MEDQ  D:  07/14/2004  T:  07/16/2004  Job:  [id number]

## 2011-04-06 NOTE — Op Note (Signed)
NAME:  [name], [name]                      ACCOUNT NO.:  [id number]   MEDICAL RECORD NO.:  [id number]                   PATIENT TYPE:  INP   LOCATION:                                       FACILITY:  [facility]   PHYSICIAN:  Donato Heinz, M.D.             DATE OF BIRTH:  November 17, 1976   DATE OF PROCEDURE:  11/12/2003  DATE OF DISCHARGE:  12/01/2003                                 OPERATIVE REPORT   PROCEDURE:  Insertion of left femoral dialysis catheter.   SURGEON:  Donato Heinz, M.D.   INDICATIONS:  Access for CVHD.   DESCRIPTION OF PROCEDURE:  The patient was prepped and draped in a sterile  fashion in the left femoral area. The left femoral vein was visualized with  a ultrasound device.  Local anesthesia was obtained using Lidocaine. The  left femoral vein was cannulated with a 15 gauge, thin-walled needle under  ultrasound guidance. The guide wire was advanced without resistance, the  needle removed from the skin.  Dilator was advanced over the guide wire to  decrease resistance to soft tissue and vessel wall.   This was removed, and then a Medcomp, 2 lumen, Vas-Cath hemodialysis  catheter was advanced over the guide wire without resistance.  Guide wire  was removed, catheter showed good flow of blood, was flushed, saline locked  and sutured in place.  The patient tolerated the procedure well. No  complications.                                               Donato Heinz, M.D.    JC/MEDQ  D:  03/09/2004  T:  03/10/2004  Job:  [id number]

## 2011-04-06 NOTE — Consult Note (Signed)
NAME:  [name], [name]                      ACCOUNT NO.:  [id number]   MEDICAL RECORD NO.:  [id number]                   PATIENT TYPE:  INP   LOCATION:  3109                                 FACILITY:  [facility]   PHYSICIAN:  Donato Heinz, M.D.             DATE OF BIRTH:  04/27/1976   DATE OF CONSULTATION:  11/11/2003  DATE OF DISCHARGE:                                   CONSULTATION   REASON FOR REFERRAL:  1. Acute renal failure.  2. Sepsis.  3. Lupus.   HISTORY OF PRESENT ILLNESS:  The patient is a 35 year old African American  female with a past medical history significant for SLE as well as lupus  nephritis who presented to [facility] emergency room on November 10, 2003,  with multiple complaints most notable for diffuse arthralgias and myalgias.  The patient also noted some swelling over the left leg and arm that was  associated with severe pain.  She denied any fevers or chills but did note  the glands in her neck has been swollen and had been having difficulty  swallowing because of this.  She reports that her last lupus flare was  approximately six months ago and was not treated in the hospital but was  tapered on her prednisone.  She follows Dr. Charlestine Night who is her  rheumatologist, and her primary care physician is Dr. Matthias Hughs in [place],  [place].  The patient was noted to have low blood pressure of 65/51  and was tachycardic with a rate of 125 as well as some tachypnea, and was  placed on oxygen and admitted for further evaluation and management.  Her  presumed diagnosis was a lupus flare, but there was also a question of  whether or not she had sepsis related to Community-acquired pneumonia.  The  plan was to volume replace her.  However, her course was complicated by  refractory hypotension and respiratory distress which required intubation  and pressors.  She was already admitted to the ICU and was intubated early  this morning by pulmonary critical care.   Of note, her serum creatinine was  2.8 on admission.  We have been consulted for further evaluation of her  renal failure.  It is important to note that we do not know what her  baseline creatinine is, and she does have a history of lupus nephritis.  She also has had some hyperkalemia with a potassium of 6.2.  We have no  baseline creatinines at the present time.   ALLERGIES:  SULFA AND IRON PILLS.   PAST MEDICAL HISTORY:  1. Lupus diagnosed in January of 2003.  2. History of lupus nephritis.  3. History of pneumonia.  4. Pancytopenia in the past.   The past medical history was taken from the initial H&P as the patient is  intubated and sedated and is not able to give a history.   OUTPATIENT MEDICATIONS:  Prednisone.   INPATIENT  MEDICATIONS:  1. Motrin 800 mg p.o. x1.  2. Toradol 30 mg IV q.6h. p.r.n.  3. Morphine p.r.n.  4. Protonix 40 mg IV daily.  5. Solu-Medrol 60 mg IV q.6h.  6. Rocephin one gram IV q.24.  7. Azithromycin 500 mg IV q.24.  8. Insulin sliding scale.  9. Hespan.  10.      Normal saline drip.  11.      Dopamine drip.  12.      Levophed drip.  13.      Diprivan drip.  14.      Sodium bicarbonate drip  15.      She was also given vancomycin IV and Diflucan 400 mg IV x1.  16.      Rocephin was discontinued, and she was started on Zosyn 4.5 grams     IV q.6h.  17.      She was also started on the Xigris.  18.      Hydrocortisone was changed to 100 mg IV q.6h.  19.      She is also on fentanyl, Norcuron and Versed p.r.n.   FAMILY HISTORY/SOCIAL HISTORY:  Can not be obtained from the patient.  However, in reviewing records:  Social history, she is single, lives in  [place] with her boyfriend.  She is not employed.  She is disabled,  unclear etiology.  Denies tobacco, alcohol or drug use.  Family history,  again, can not be ascertained, but again, according to records, her mother  died at age 38 from a heart attack.  No family history of kidney  disease.   REVIEW OF SYSTEMS:  Unobtainable.   PHYSICAL EXAMINATION:  GENERAL:  The patient is a well-developed, well-  nourished female who is intubated and sedated.  VITAL SIGNS:  Temperature 98.6, pulse 108, blood pressure 90/60.  Respiratory rate 20, intake was 5875 and output was 1233.  HEENT:  Head normocephalic, atraumatic.  There is some periorbital edema.  NECK:  Supple, no lymphadenopathy.  There is some submandibular lymph node  enlargement.  LUNGS:  Have scattered rhonchi, good air movement.  CARDIAC:  Exam was tachycardic at 108, no rub.  ABDOMEN:  Exam was decreased bowel sounds.  It was soft.  EXTREMITIES:  She has upper greater than lower pitting edema in bilateral  upper extremities greater than bilateral lower extremities.  SKIN:  No rashes can be seen.  No embolic changes.  No cyanosis.   LABORATORY DATA:  Sodium 139, potassium 5.7, chloride 113, CO2 15, BUN 47,  creatinine 2.5, glucose 162.  White blood cell count 1.5, hemoglobin 7,  platelets 23.  INR 1.7.  ABG:  pH of 7.28, pCO2 29, pO2 249.  D-Dimer is  10.56, fibrinogen 383.  Creatinine on admission was 2.8, BUN 49, potassium  was 6.2 and CO2 was 20.   ASSESSMENT/PLAN:  1. Non-oliguric acute renal failure which is likely secondary to sepsis and     ATN although it may have been exacerbated by nonsteroidal     antiinflammatory drug use.  Her UA showed only 1+ proteinuria, negative     for blood.  Doubt that this is lupus nephritis given this current     situation.  However, the goal at this time is to continue with supportive     care and try to restore the patient's blood pressure and minimize pressor     use.  Her urine output is improved after she had received Lasix.  Plan to  transfuse her blood to help volume expand her.  At the present time,     there is no indication for hemodialysis, but we will continue to follow    along closely.  Would also renal dose medications such as her Zosyn and     change  to 2.25 grams IV q.8.  Also discontinue her nonsteroidals, Toradol     and Motrin.  Continue to follow.  2. Metabolic acidosis presumably secondary to sepsis and acute renal     failure.  Her bicarbonate deficit is approximately 325 mEq.  At this     time, we will increase her rate of bicarbonate to 200 cc and hour up from     75cc an hour.  We will continue to follow her pH and CO2.  We will     recheck a BMET in two hours and make further adjustments at that time.     We will also check a lactic level given her acidosis and pressor use.  3. Hyperkalemia secondary to #1 and #2.  We will continue to follow her     potassium levels with a bicarbonate drip.  4. Ventilator-dependent respiratory failure.  This is unclear whether this     is secondary to pneumonia versus lupus pneumonitis.  The patient     underwent bronchoscopy by Dr. Gwenette Greet and had significant blood in the     airways.  We will continue with antibiotics and supportive care as well     as steroids.  5. Anemia.  The patient's hemoglobin is very low.  Given her hypotension and     tachycardia, she would greatly benefit from true volume expansion.  We     will transfuse two units of packed red blood cells and continue to     follow.  6. Sepsis and DIC.  The patient is on Xigris and broad-spectrum antibiotics     as well as antifungals.  We will continue to follow and continue with     supportive care.   We will continue to follow along closely with you.  Thank you for this  consultation.                                               Donato Heinz, M.D.    JC/MEDQ  D:  11/11/2003  T:  11/13/2003  Job:  [id number]

## 2011-04-06 NOTE — Discharge Summary (Signed)
NAME:  [name], [name]                      ACCOUNT NO.:  [id number]   MEDICAL RECORD NO.:  [id number]                   PATIENT TYPE:  INP   LOCATION:  2025                                 FACILITY:  [facility]   PHYSICIAN:  Aquilla Hacker, M.D.              DATE OF BIRTH:  Jul 07, 1976   DATE OF ADMISSION:  07/05/2004  DATE OF DISCHARGE:  07/23/2004                                 DISCHARGE SUMMARY   CHIEF COMPLAINT:  Shortness of breath, pleuritic chest pain and poor  appetite.   HISTORY OF PRESENT ILLNESS:  [name] is a 35 year old female with a past  medical history of systemic lupus erythematosus, septic shock requiring  mechanical ventilation and intubation in December of 2004 until January  2005, along with septic shock secondary to strep pneumonia bacteremia, and  chronic bilateral pleural effusions, who presented to the emergency  department complaining of left sided pleuritic chest pain worsened by deep  inspiration along with dyspnea and orthopnea. She also complained of a  productive cough consisting of brown and white streaks with occasional blood  present. She denied fever but complained of chills.   PAST MEDICAL HISTORY:  1.  Systemic lupus erythematosus.  2.  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  3.  Chronic bilateral pleural effusions, para-pneumonic versus transudative      in the past.  4.  Septic shock secondary to strep pneumonia bacteremia in January of 2005.  5.  Acute renal failure requiring veno venous hemodiafiltration.  6.  Hypertension.  7.  Steroid induced hyperglycemia.  8.  Status post PEG placement in February 2005 by Dr. Henrene Pastor, removed several      months ago.  9.  Thrombocytopenia.  10. Chronic elevation of liver transaminases.  11. Hypoalbuminemia.  12. Chronic normocytic anemia.  13. History of recurrent fevers.   ADMISSION MEDICATIONS:  Prednisone 10 mg once a day, Clonidine, Atenolol 25  mg p.o. q.d.,  potassium chloride 20 meq daily.   ALLERGIES:  SULFA.   SOCIAL HISTORY:  The patient denies alcohol or tobacco. She also denies  street drug use.   FAMILY HISTORY:  Her mother died of a heart attack at the age of 35.  Father's medical history is unknown.   LABORATORY DATA:  At the time of admission, the patient's chest x-ray  revealed bilateral pleural effusions, a left lower lobe atelectasis versus  infiltrate. There was also a question of a pericardial effusion.   Her electrocardiogram shows sinus tachycardia with a heart rate of 132 beats  per minute and specific STT wave changes. Her D-dimer was elevated at 8.04.   HOSPITAL COURSE:  The patient subsequently was admitted into the hospital  for dyspnea with pleuritic chest pain with an etiology believed to be left  lower lobe pneumonia. The patient was given Rocephin and Azithromycin  intravenously in the emergency room. She also initially received Solu-Medrol  80 mg  intravenously. Subsequently, following her hospitalization, she was  started on Cefepime 2 grams intravenously q. 12 hours and Avelox 400 mg  intravenously q.d. along with stress steroid coverage with hydrocortisone 50  mg intravenously q. 8 hours. A CT scan of the chest was ordered to rule out  pleural effusion as well as bilateral lower extremity venous Doppler's. In  addition, a 2-D echocardiogram was ordered.     ___________________________________________                                         Aquilla Hacker, M.D.   OR/MEDQ  D:  07/23/2004  T:  07/23/2004  Job:  [id number]

## 2011-04-06 NOTE — Consult Note (Signed)
NAME:  [name]                      ACCOUNT NO.:  [id number]   MEDICAL RECORD NO.:  [id number]                   PATIENT TYPE:  INP   LOCATION:  3109                                 FACILITY:  [facility]   PHYSICIAN:  Asencion Noble, M.D. [facility]            DATE OF BIRTH:  [birth year]   DATE OF CONSULTATION:  11/11/2003  DATE OF DISCHARGE:                                   CONSULTATION   CHIEF COMPLAINT:  Shock, acute lupus syndrome, rule out sepsis.   HISTORY OF PRESENT ILLNESS:  This is a 35 year old white female noted  subacute onset of increasing swelling in the left arm, soreness in the  throat, and back pain.  Symptoms began 24 hours ago.  She took 20 mg of  prednisone, thought she improved, but then worsened.  She states that in the  past her lupus attacks when they have flared up have occurred in this nature  but tonight she got progressively worse and was brought to the emergency  room and found to be in shock, blood pressure 65/50, temperature 98-99.2,  pulse in the 120s and she was very acidotic and had also hyperkalemia, renal  insufficiency.  The patient required admission to the ICU by the [facility]  hospitalists service and later in the evening we were asked to assist in the  placement of central line and assess for intubation.   PAST MEDICAL HISTORY:  1. History of lupus diagnosed in January 2003.  2. Previous history of lupus nephritis.  3. History of pneumonia in 2003 and 2004.  4. No other significant past history has been documented.   MEDICATIONS ON ADMISSION:  1. Motrin.  2. Prednisone 10 mg a day for the past month.  3. Hydrochlorothiazide stopped a month ago.   SOCIAL HISTORY:  Does not smoke or drink.  Is disabled and has a cousin who  has lupus.   ALLERGIES:  None.   REVIEW OF SYSTEMS:  Otherwise, review of systems are noncontributory.   PHYSICAL EXAMINATION:  VITAL SIGNS:  Blood pressure 65/50, respirations 26,  pulse 125, temperature 99,  saturation 97% initially on room air.  Now she is  dropping saturations on 100% nonrebreather.  GENERAL:  This is an ill-appearing African-American female in acute  distress.  HEENT:  White plaques on the hard palate compatible with thrush.  There is  submandibular and anterior cervical chain lymphadenopathy.  CHEST:  Very tachypneic.  Chest shows equal breath sounds, rales, few  expired wheezes.  CARDIAC:  Tachycardia without S3.  Normal S1, S2.  ABDOMEN:  Protuberant.  Bowel sounds hypoactive.  EXTREMITIES:  Some edema.  No visible cord.  NEUROLOGIC:  Intact.  The patient was awake and alert, moves all fours.  SKIN:  Diaphoretic.  No rash noted.   LABORATORY DATA:  Urinalysis showed 3-6 white cells, rare bacteria, and a  positive leukocyte esterase.  Strep group A screen was negative.  Pregnancy  urine test was negative.  Sedimentation rate was 60.  White count 1.2,  hemoglobin 9.4, platelet count 40,000.  There is a left shift.  Sodium 138,  potassium 6.2, chloride 107, CO2 20, BUN 49, creatinine 2.8, blood sugar 72.  SGOT 97, SGPT 42, bilirubin 1.9, calcium 6.5.   IMPRESSION:  A 35 year old African-American female with history of lupus,  rule out acute lupus syndrome with acute lupus nephritis versus severe  sepsis with septic shock versus both with lungs as likely source, associated  pancytopenia, bilaterally infiltrates, toxic respiratory failure, DIC  picture, elevated liver function tests, acute renal failure, hyperkalemia,  possible hypoadrenalism, shock due to severe sepsis versus hypoadrenalism,  probable severe sepsis as a result of all of the above with pulmonary source  versus fungemia versus opportunistic infection.  As I said, I cannot rule  out the acute lupus syndrome.  The patient will need treatment for both.   RECOMMENDATIONS:  Broaden antibiotics to vancomycin, Diflucan, Zosyn,  Zithromax.  Follow up cultures.  Get DAL of the lungs.  Check Pneumocystis  stain.   Give Xigris IV for severe sepsis.  Check cortisol levels.  Do an  ACTH stimulation.  Check lactic acid levels.  Change to IV cortisone 100 mg  q.6h.  Place on mechanical ventilatory support with low stretch ARDS  protocol.  Give IV Levophed.  Give IV fluid boluses to a CVT of greater than  or equal to 12.  Place on _________ bed.  Give IV sodium bicarbonate,  insulin D50, and calcium for hyperkalemia.  May yet require renal consult  with [facility].  Transfer to critical care service.  Discontinue all  nephrotoxins.  Use Diprivan for sedation, nutrition later via the Panda  tube.                                               Asencion Noble, M.D. [name]    PW/MEDQ  D:  11/11/2003  T:  11/12/2003  Job:  [id number]   cc:   [name]  [address]  [place]  [place] [address]  Fax: [name], M.D.

## 2011-04-06 NOTE — Discharge Summary (Signed)
NAME:  [name], [name]                      ACCOUNT NO.:  [id number]   MEDICAL RECORD NO.:  [id number]                   PATIENT TYPE:  INP   LOCATION:  6709                                 FACILITY:  [facility]   PHYSICIAN:  Rexene Alberts, M.D.                 DATE OF BIRTH:  07-25-1976   DATE OF ADMISSION:  01/20/2004  DATE OF DISCHARGE:  02/05/2004                                 DISCHARGE SUMMARY   DISCHARGE DIAGNOSES:  1. Streptococcus pneumoniae bacteriemia.  2. Bilateral para-pneumonic pleural effusions with pneumonia.  3. Hypertension.  4. Normocytic anemia.  5. Chronic thrombocytopenia (chronic pancytopenia).  6. 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  7. Lupus.  8. Steroid-induced diabetes mellitus.  9. Status post percutaneous endoscopic gastrostomy on December 23, 2003 per     Dr. Docia Chuck. Henrene Pastor.  10.      Hypoalbuminemia.  11.      Chronic elevated liver transaminases.   SECONDARY DISCHARGE DIAGNOSES:  1. Shock/sepsis in December 2004 to January 2005 secondary to Streptococcus     pneumoniae.     a. The patient required mechanical ventilation and intubation x2 weeks.     b. Acute renal failure requiring continuous venovenous hemodiafiltration.     c. Chronic deconditioning secondary to sepsis.  Quadriplegia and        myopathy.  Now mostly resolved, status post rehabilitation.  2. Recurrent fevers in February of 2005, source unknown.   DISCHARGE MEDICATIONS:  1. Furosemide 40 mg daily.  2. Avapro 300 mg daily.  3. Clonidine 0.2 mg half a tablet twice daily.  4. Spironolactone 25 mg half a pill daily.  5. Atenolol 100 mg b.i.d.  6. Tiazac 240 mg daily.  7. Prednisone 1-1/2 pills daily x1 weeks, then 1 pill daily.  8. Glipizide 5 mg half a pill b.i.d.  9. Potassium chloride 20 mEq daily.  10.      CellCept 500 mg b.i.d.  11.      Over-the-counter iron pill 1 tablet t.i.d.   DISCHARGE DISPOSITION:  The patient was discharged to home on February 05, 2004  in improved and stable condition.  She was advised to follow up with her  primary care physician, Dr. Lindaann Slough, on Thursday, February 10, 2004,  at 10 a.m.  The patient also received [facility] services with an  R.N. and physical therapist.   CONSULTATIONS:  1. Dr. Legrand Como B. Wert, pulmonologist.  2. Dr. Nicanor Alcon, CVTS.  3. Dr. Charlestine Night, rheumatologist.  4. Dr. Joyice Faster. Deterding, nephrologist.   PROCEDURES PERFORMED:  1. CT scan of the chest with contrast on January 20, 2004.  The results     revealed no evidence of pulmonary emboli.  Moderate dependent     consolidations bilaterally, slightly improved on the right side.  Stable     bilateral pleural effusions.  Stable hepatosplenomegaly.  Slightly     decreased  fluid along the distal ascending aorta.  CT scan of the pelvis     and lower extremities:  No evidence of deep venous thrombosis.  Small     amount of free fluid in the pelvis noted.  2. Right thoracentesis via ultrasound on January 20, 2004.  The results     revealed 195 mL of exudative pleural effusion.  3. Ultrasound of the chest on January 25, 2004.  The results revealed very     small effusions bilaterally, not large enough to safely tap at this time.  4. Right upper extremity peripherally inserted central catheter per     radiology on January 27, 2004.  Removal of the peripherally inserted     central catheter line on February 05, 2004.  5. CT scan of the chest with contrast repeated on January 23, 2004.  Slight     interval increase in bilateral pleural effusions.  Overall volume remains     small bilaterally.  The superior aspect of the right pleural effusion may     be partially loculated.  Associated bilateral lower lobe consolidation as     well as airspace infiltrates within the lingula and right middle lobe.  A     new component of small amount of subphrenic fluid on the right extending     into the perihepatic space.   HISTORY OF PRESENT ILLNESS:  The  patient is a 35 year old African American  lady with a past medical history significant for lupus, pneumococcal sepsis  in December of 2004, and lupus nephritis, who presented to the emergency  department on January 20, 2004 with acute right-sided chest pain and shortness  of breath.  The patient has a history of pneumococcal sepsis and shock in  December of 2004.  The hospital course was complicated by the patient  requiring mechanical ventilation and intubation for approximately 2 weeks.  During the hospitalization between December 2004 and January 2005, the  patient developed acute renal failure requiring CVVHD.  The patient also  developed dysphagia requiring a PEG tube placement.  She was severely  deconditioned secondary to myopathy and became temporarily quadriplegic.  She underwent rehabilitation in January to February of 2005.  She was  readmitted in February of 2005 secondary to recurrent fevers.  Dr. Michel Bickers, infectious disease physician, evaluated the patient during the  February 2005 admission.  Antibiotic therapy was not initiated at that time  and there were no recommendations to pursue the evaluation of the patient's  bilateral pleural effusions at that time.  The patient's fevers resolved and  the patient was discharged to home.  Over the past few days prior to  hospital admission during this admission, the patient has had a 3- to 4-day  history of pleuritic right-sided chest pain and shortness of breath.  When  the patient was evaluated in the emergency department, she presented with a  blood pressure of 120/77 and a temperature of 99.1.  However, during the  course in the emergency department, her blood pressure fell to 87/50 and her  temperature increased to 103.8.  The patient was therefore admitted to the  stepdown unit for further evaluation and management.  HOSPITAL COURSE:  PROBLEM #1 - STREPTOCOCCUS PNEUMONIAE BACTERIEMIA WITH  SEPSIS:  The initial management  included obtaining blood cultures x2,  baseline blood work and ordering a CT scan of the chest to evaluate for  PE/pleural effusion/pneumonia.  The patient was started on treatment  empirically with Zosyn 3.375 mg  IV q.6 h.  She was initially maintained on  her home dose of prednisone at 40 mg b.i.d.  The patient was bolused with  normal saline to improve her blood pressures.  This was carried out several  times during the first 24 hours of hospitalization.  Critical care  physician, Dr. Melvyn Novas, was consulted.  He recommended treating the patient  with stress doses of hydrocortisone at 200 mg IV q.8 h.  He also added  Avelox at 400 mg IV q.24 h. to the antibiotic regimen.  The CT scan of the  chest revealed no pulmonary emboli, however, it did reveal bilateral  consolidations and bilateral pleural effusions.  The patient subsequently  underwent a right thoracentesis via ultrasound guidance.  The thoracentesis  yielded 195 mL of exudative pleural fluid.  The pleural fluid was sent to  the lab for culture.  Dr. Arlyce Dice, cardiovascular/thoracic surgeon, was  consulted for evaluation of the bilateral pleural fluid.  It was felt that  the bilateral pleural fluid or the bilateral pleural effusions may have  needed chest tube treatment.  Dr. Arlyce Dice felt that the amount of the pleural  effusion was not amenable to chest tube placement.  The blood cultures and  the pleural fluid cultures eventually grew out Strep pneumoniae.  The  patient was maintained on Zosyn and Avelox for several more days.  Once the  sensitivities came back, the Zosyn was discontinued and the patient was  maintained on Avelox 400 mg IV daily.  The patient's blood pressures  rebounded and the normal saline was discontinued.  Actually, her blood  pressures became hypertensive after several days.  She developed some mild  anasarca and had to be given Lasix periodically during the first few days of  hospitalization.   Dr. Charlestine Night  was consulted for management of the patient's lupus.  He  recommended titrating down the hydrocortisone to 50 mg IV q.8 h.  He  subsequently recommended discontinuing the hydrocortisone and restarting the  patient on her usual dose of 30 mg b.i.d. of prednisone.  The patient became  afebrile during the hospital course and remained afebrile during the  majority of the hospitalization.  A followup chest x-ray, decubitus  positioning, revealed stable bilateral pleural effusions, although the  amount was read as small.  Dr. Arlyce Dice reassessed the patient and recommended  ordering an ultrasound of the chest to assess the pleural effusions again.  The ultrasound of the chest revealed very small effusions bilaterally which  were not large enough to safely tap.  Dr. Charlestine Night felt that the patient's  pleural effusions were probably secondary to the hypoalbuminemia and/or secondary to the bilateral pneumonia.  The patient's chest pain and  shortness of breath resolved during the hospitalization.  She was  oxygenating between 93% and 96% on room air.  She began to ambulate with  physical therapy.  She is still somewhat deconditioned, however, she was  stable enough to be discharged to home.  The patient will be maintained on  furosemide for mild anasarca, as well as spironolactone.  The antibiotic was  discontinued at hospital discharge.  She received a total of approximately  16 days of antibiotic therapy.   PROBLEM #2 - LUPUS NEPHRITIS:  As stated above, the patient was treated  initially with IV fluids and stress doses of hydrocortisone.  The steroids  were eventually tapered down to prednisone 30 mg b.i.d.  The dose was  eventually decreased further to 30 mg daily per Dr. Charlestine Night.  Dr. Charlestine Night  consulted nephrology secondary to the patient's known history of lupus  nephritis.  The patient did require CVVHD in January of 2005.  Her  creatinine during this admission ranged between 0.6 and 1.2.  Her  BUN ranged  between 10 and 25.  Dr. Joyice Faster. Deterding, nephrologist, provided the  consultation.  He ordered a 24-hour urine.  The 24-hour urine revealed a  creatinine clearance of 55, 24-hour urine protein of 1.2 g, and a total  volume of 1700 mL.  Dr. Jimmy Footman recommended increasing the Lasix to 40 mg  daily and starting CellCept.  Lasix and CellCept were started during the  hospital course.  CellCept was started at 500 mg b.i.d.  Dr. Jimmy Footman also  felt that the patient's elevated blood pressure was secondary to the lupus  nephritis and generalized anasarca.  The patient's BUN at the time of  hospital discharge was 17 and her creatinine at the time of discharge was  0.6.   PROBLEM #3 - HYPERTENSION:  As mentioned above, the patient became very  hypotensive in the emergency department, requiring IV fluid resuscitation.  However, several days following, the patient's blood pressures became very  elevated.  Her blood pressures systolically got as high as above [id number] and her  diastolic pressures increased to over 120.  She was started on multiple  antihypertensive medications.  Finally, she was treated with Avapro 300 mg  daily, clonidine 0.2 mg half a tablet b.i.d., atenolol 100 mg b.i.d. and  Tiazac 240 mg daily.  At the time of hospital discharge, her blood pressures  systolically ranged between 144 and [id number] and her diastolic pressures ranged  between 90 and 110.  This regimen may be somewhat problematic for the  patient, given that she does not have insurance to pay for her medications.  The hospital via the case manager did provide the patient with at least a  week's supply of these antihypertensive medications.  The patient is  applying for Medicaid and believes that she will be eligible for Medicaid.  Further management per Dr. Charlestine Night and Dr.  Jimmy Footman.   PROBLEM #4 - STEROID-INDUCED DIABETES MELLITUS:  The patient has no known prior history of diabetes mellitus.  However, over  the past couple of  months, she has developed elevated blood sugars thought to be secondary to  chronic intermittent high doses of steroid therapy and chronic baseline  prednisone therapy.  She was treated with sliding-scale insulin regimen as  well as Lantus and glipizide during her hospital course. Once the IV  steroids were tapered off, her capillary blood sugars did improve.  She will  be sent home on glipizide 5 mg half a tablet b.i.d.  The patient did have  several episodes of hypoglycemia during the hospital course over the past  few days.  The patient was advised to not take the glipizide if her  capillary blood glucose was under 100.  She was advised to increase the  glipizide to 5 mg 1 tablet twice daily for blood sugars consistently above  150.  The patient was admonished to not skip meals.   PROBLEM #5 - HYPOALBUMINEMIA:  The patient has proteinuria as discussed  above via the 24-hour urine.  Her albumin ranged between 1.1 and 1.9 during  the hospitalization.  She was provided with nutritional supplements during  the hospital course.  As stated above, she was treated with steroids and  CellCept to help ameliorate the proteinuria from lupus nephritis.   PROBLEM #  6 - STATUS POST PERCUTANEOUS ENDOSCOPIC GASTROSTOMY TUBE, December 23, 2003:  The patient currently has a PEG tube in which was placed in  February of 2005 following her long hospital course then.  Dr. Henrene Pastor,  gastroenterologist, inserted the PEG tube at that time.  An evaluation for  discontinuing the PEG tube should be provided by Dr. Henrene Pastor in a few weeks.  Apparently, the PEG tube will need to stay in at least 8 weeks.   PROBLEM #7 - CHRONIC ELEVATED LIVER TRANSAMINASES:  The patient's LFTs are  usually chronically elevated.  This may be secondary to the low oncotic  pressure secondary to hypoalbuminemia.  The AST was 174 on admission but  fell to 27 at the time of hospital discharge.  The ALT was 75 on admission   but fell to 29 at discharge.  The alkaline phosphatase was 354 and fell to  193 prior to hospital discharge.  The patient's total bilirubin ranged  between 0.5 and 0.7 during the hospitalization.   PROBLEM #8 - CHRONIC NORMOCYTIC ANEMIA:  The patient's hemoglobin on  admission was 13.5, however, it gradually decreased during the hospital  course.  It fell as low at 8.2, however, the hemoglobin was repeated at 9.3.  The patient demonstrated no evidence of bleeding grossly from her rectum or  from her bladder during the hospital course. The patient was, however,  treated with Lovenox during the hospital course and this was eventually  discontinued.  The patient also has chronic thrombocytopenia.  Her platelets  ranged between 62,000 and 128,000 during the hospital course.  The patient  had been referred to Dr. Rudell Cobb. Ennever for further evaluation of chronic  pancytopenia.  The patient has not seen Dr. Marin Olp in the outpatient setting yet.  Arrangement will be deferred to Dr. Charlestine Night for further  evaluation of the patient's pancytopenia.  The patient was admonished to  take an over-the-counter iron supplement 2-3 times daily.   The patient was assisted by the case management team at [facility]  to assist with outpatient medications at the time of hospital discharge.  The patient was advised to reapply for Medicaid and/or disability.  The  patient is disabled and should be able to receive disability and Medicaid,  given her extensive and frequent past hospitalizations.  The patient was  advised again to seek social services for reapplication of disability and  Medicaid.                                                Rexene Alberts, M.D.    DF/MEDQ  D:  02/08/2004  T:  02/09/2004  Job:  [id number]   cc:   Lindaann Slough, M.D.   [facility] Marin Olp, M.D.  [address], [place] [address]  Fax: [facility]

## 2011-04-06 NOTE — Discharge Summary (Signed)
NAMEROSABELLE, JAGERS            ACCOUNT NO.:  [id number]   MEDICAL RECORD NO.:  [id number]          PATIENT TYPE:  INP   LOCATION:  5528                         FACILITY:  [facility]   PHYSICIAN:  Ashby Dawes. Polite, M.D. DATE OF BIRTH:  July 01, 1976   DATE OF ADMISSION:  11/03/2007  DATE OF DISCHARGE:  11/05/2007                               DISCHARGE SUMMARY   DISCHARGE DIAGNOSES:  1. Right gluteal abscess status post incision and drainage by Dr.      Harlow Asa; final culture pending at the time of this dictation.      Discharged home on Augmentin b.i.d.  Please note, the patient was      intolerant to [facility] during the hospital as well as DIGOXIN.      The patient was discharged afebrile without leukocytosis.  2. Diabetes.  3. Hypertension.  4. Anxiety.  5. Lupus.  6. Hypokalemia, repleted.   DISCHARGE MEDICATIONS:  Augmentin 875 mg b.i.d.   The patient to continue home medications:  1. Prednisone 5 mg daily.  2. Lexapro 10 mg daily.  3. Zetia daily.  4. Metformin 500 mg daily.  5. Caduet 5/10 daily.  6. Clonidine 0.3 b.i.d.  7. Metoprolol 100 mg b.i.d.  8. Cozaar 100 mg daily.  9. Amlodipine 5 mg daily.  10.Cellcept 500 mg b.i.d.  11.Plaquenil 200 mg b.i.d.   DISPOSITION:  Discharged to home in stable condition with home health  nurse for wound care.  The patient asked to follow up with Dr. Harlow Asa in  approximately 2 weeks, asked to follow up with myself in approximately 1-  2 weeks.   CONSULTANTS:  Dr. Harlow Asa.   PROCEDURES:  1. Operative I&D of gluteal abscess.  2. The patient had incomplete CT of the abdomen and pelvis; IV      infiltrated.  3. Wound culture pending at the time of disposition.   HISTORY OF PRESENT ILLNESS:  A 35 year old female presented to the  office with gluteal abscess.  Admission was deemed necessary for further  evaluation and treatment.  Please see dictated H&P for further details.   HOSPITAL COURSE:  The patient was admitted to a  medicine floor bed for  evaluation and treatment of gluteal abscess.  The patient was  empirically started on IV vancomycin for probable MRSA.  The patient did  have a reaction characterized by some shortness of breath.  Doxycycline  was tried.  The patient had a similar reaction.  She was continued on  Zosyn which she tolerated well.  The patient underwent debridement and  drainage of the gluteal abscess on December 16.  The patient's hospital  course was otherwise without complication.  She was discharged on  December 17 in stable condition afebrile without leukocytosis.  Was told  to continue medications as stated above and to follow up with Dr. Harlow Asa  as well as myself in approximately 2 weeks, and she was discharged with  [facility].      Ashby Dawes. Polite, M.D.  Electronically Signed     RDP/MEDQ  D:  11/26/2007  T:  11/26/2007  Job:  [id number]

## 2011-04-06 NOTE — H&P (Signed)
NAME:  [name], [name]                      ACCOUNT NO.:  [id number]   MEDICAL RECORD NO.:  [id number]                   PATIENT TYPE:  EMS   LOCATION:  [facility]                                 FACILITY:  [facility]   PHYSICIAN:  Rexene Alberts, M.D.                 DATE OF BIRTH:  [birth year]   DATE OF ADMISSION:  07/05/2004  DATE OF DISCHARGE:                                HISTORY & PHYSICAL   CONTINUATION   MEDICATIONS:  1. Prednisone 10 mg daily.  2. Clonidine.  The patient is not sure of the dose but she believes it is     0.1 mg 1.5 tablets q.h.s.  3. Atenolol 25 mg daily (the patient is not sure of dose).  4. KCl 20 mEq daily.  5. Another pill, unknown.   ALLERGIES:  The patient has an allergy to SULFA.   SOCIAL HISTORY:  The patient lives with her fiance, Mr. Jones Broom.  She  has no children.  She has been approved for disability, however the funds  are pending.  She receives Medicaid.  She does not work.  She denies  tobacco, alcohol and drug use.  She does a little housework.  She still  drives occasionally.  She completed the 12th grade in school.  She can read  and write.   FAMILY HISTORY:  Her mother died of a heart attack at 31 years of age.  Her  father is living and is 36 years of age.  He is apparently healthy, however  the patient has had no recent contact with him.   REVIEW OF SYSTEMS:  As above in the history of present illness.  Review of  systems is otherwise negative.   PHYSICAL EXAMINATION:  Temperature 101.4, blood pressure 123/85, pulse 145,  respiratory rate 18, oxygen saturation of 2 liters 99%.  GENERAL:  The patient is an alert, small-framed 35 year old African-American  woman who is currently lying in bed in no acute distress.  HEENT:  Head is normocephalic and atraumatic.  Pupils equal, round and  reactive to light.  Extraocular movements are intact.  Conjunctiva are  clear, sclerae are white.  Tympanic membranes bilaterally are obscured by  cerumen.  Oropharynx reveals good dentition.  Mucous membranes are mildly  dry.  No posterior exudate or erythema.  Nasal mucosa is moist, no drainage,  no sinus tenderness.  NECK:  Supple, no adenopathy, no thyromegaly, no bruit, no JVD.  LUNGS:  The patient is mildly tachypneic and mildly dyspneic when speaking,  however she does not appear to be in any acute respiratory distress at rest.  Breath sounds are substantially decreased on the left greater than the right  bases.  She has some clear breath sounds in the upper lobes.  Her chest is  dull to percussion in the bases.  HEART:  S1, S2 with a soft systolic murmur.  No rubs and no gallops.  ABDOMEN:  The  patient's PEG tube has been removed.  Bowel sounds are  present.  Well-healed, small hypogastric and epigastric scars.  Her abdomen  is soft, nontender, nondistended, no hepatosplenomegaly.  Rectal and GU are  deferred.  EXTREMITIES:  The patient has 1+ non-pitting edema on the right leg and a  trace to 1+ of edema of the left leg and foot.  No evidence of acutely  inflamed joints throughout.  No evidence of synovitis, joint swelling, or  erythema.  The patient has a good range of motion of all of her joints.  Pedal pulses are 2+ bilaterally.  NEUROLOGIC:  The patient is alert and oriented x3.  Cranial nerves II-XII  intact.  Strength is 5/5 throughout.  Sensation is intact to soft touch.  Cerebellar with finger to nose is intact.   ADMISSION LABORATORIES:  Chest x-ray revealed bilateral pleural effusions,  left lower lobe atelectasis versus infiltrate.  Questionable pericardial  effusion.  EKG:  Sinus tachycardia with a heart rate of 132 BPM.  Nonspecific ST T-wave changes.  D-Dimer 8.04.  WBC 7.8, hemoglobin 10.1, hematocrit 30.8, MCV 85.7,  platelets 213, myoglobin 60.7, CK-MB 1.4, Troponin I less than 0.05.  Sodium  135, potassium 3.8, chloride 109, CO2 19, glucose 135, BUN 10, creatinine  0.7, calcium 8.1, total protein 6.0,  albumin 2.5, AST 35, ALT 19, alkaline  phosphatase 146, total bilirubin 0.7.   ASSESSMENT:  1. Dyspnea with pleuritic chest pain.  The etiology is consistent with the     pleural effusions and possibly left lower lobe pneumonia seen on chest x-     ray.  We will also consider the patient's symptoms stemming from a     possible diagnoses of pericardial effusion.  It is important to note that     the patient has had intermittently chronic bilateral pleural effusions     for at least a year.  The pleural effusions have been thought to be     secondary to pneumonia and possibly transudative from chronic lupus.  She     has undergone percutaneous thoracentesis in the past.  2. Fever.  The fever is more than likely secondary to a possible pneumonia.     The patient has a history of strep pneumo pneumonia and a history of     septic shock secondary to strep pneumo.  The patient does not appear to     be toxic at this time.  3. Elevated D-Dimer.  The patient needs to be ruled out for PE.  4. Tachycardia.  This is probably secondary to volume depletion.   PLAN:  1. The patient will be admitted to Step Down unit.  2. Blood cultures have been ordered by the emergency department's physician.     The patient was also given a gram of Rocephin and 500 mg of Azithromycin     IV.  The patient was also given Solu Medrol 80 mg IV x1 by the emergency     department physician.  3. Antibiotic treatment will continue with Cefepime 2 grams IV q.12h. and     Avelox 400 mg IV daily.  4. Will provide stress steroid coverage with hydrocortisone 50 mg IV q.8h.     and then taper shortly afterwards.  5. Gentle to moderate volume repletion with normal saline 70 mL per hour.  6. Will check a CT scan of the chest to rule out pleural effusion as well as     bilateral lower extremity venous Dopplers.  7.  Will check a 2D echocardiogram to evaluate further the possible    pericardial effusion seen on chest x-ray.  8.  Will check a urinalysis and culture.  9. Will check a right and left decubitus films in the a.m. and assess for     possible percutaneous thoracentesis if needed.  10.      Will start sliding scale insulin regimen for anticipated elevated     blood glucose.  11.      Prophylactic Protonix and Lovenox.  12.      Dilaudid and Tylenol as needed for pain.  2.      Consider consulting rheumatologist Dr. Charlestine Night and the pulmonary     team if needed.                                                Rexene Alberts, M.D.   DF/MEDQ  D:  07/05/2004  T:  07/05/2004  Job:  [id number]   cc:   Lindaann Slough, M.D.

## 2011-04-06 NOTE — H&P (Signed)
NAME:  [name], [name]                      ACCOUNT NO.:  [id number]   MEDICAL RECORD NO.:  [id number]                   PATIENT TYPE:  INP   LOCATION:  3109                                 FACILITY:  [facility]   PHYSICIAN:  Rexene Alberts, M.D.                 DATE OF BIRTH:  [birth year]   DATE OF ADMISSION:  11/10/2003  DATE OF DISCHARGE:                                HISTORY & PHYSICAL   PRIMARY CARE PHYSICIAN:  Matthias Hughs, M.D., [place], [place].   RHEUMATOLOGIST:  Lindaann Slough, M.D.   CHIEF COMPLAINT:  Diffuse muscle aches, particularly left arm pain, numbness  and swelling, and left leg swelling and pain, and painful swallowing and  difficulty swallowing.   HISTORY OF PRESENT ILLNESS:  Ms. Bearman is a 35 year old lady with a past  medical history significant for lupus diagnosed in January 2003, history of  lupus nephritis and a history of pneumonia who presented to  the emergency  department with a 24-hour history of diffuse muscle aches in her back, neck,  both arms, both legs, and both feet.  The patient states that most of her  pain and discomfort occur in her left arm and her left foot.  The patient  has also had some difficulty with swallowing and she has also had some  difficulty with swallowing, and she also had some painful swallowing.  She  does note that she has a history of having yeast infections/thrush in her  mouth in the past.  She is currently on chronic prednisone at 10 mg per day.   Approximately 24-36 hours ago the patient's left foot began to swell.  The  swelling progressed   Dictation ended at this point.                                                Rexene Alberts, M.D.    DF/MEDQ  D:  11/10/2003  T:  11/11/2003  Job:  [id number]   cc:   Matthias Hughs  [address]  [place]  [place] [address]  Fax: [place], M.D.

## 2011-04-06 NOTE — H&P (Signed)
NAME:  Mary, [name]                      ACCOUNT NO.:  [id number]   MEDICAL RECORD NO.:  [id number]                   PATIENT TYPE:  INP   LOCATION:  1825                                 FACILITY:  [facility]   PHYSICIAN:  Rexene Alberts, M.D.                 DATE OF BIRTH:  12-05-1975   DATE OF ADMISSION:  07/05/2004  DATE OF DISCHARGE:                                HISTORY & PHYSICAL   PRIMARY CARE PHYSICIAN:  Dr. Hurley Cisco.   CHIEF COMPLAINT:  Shortness of breath, pleuritic chest pain, and poor  appetite.   HISTORY OF PRESENT ILLNESS:  [name] is a 35 year old African-American  woman with a past medical history significant for systemic lupus  erythematous, septic shock requiring mechanical ventilation and intubation  in December of 2004 to January of 2005, septic shock secondary to strep  Pneumo bacteriemia, and chronic bilateral pleural effusions, who presents to  the emergency department today with a 2-day history of left-sided pleuritic  chest pain, shortness of breath and orthopnea.  The patient had been in her  usual state of health up until 2 days ago when she began to feel a little  short of breath.  The patient's short of breath is worse with activity, but  also she has some increase in shortness of breath when lying flat.  The  patient has had a productive cough with both white and brown sputum.  The  sputum occasionally has some blood streaks in it.  She has had subjective  chills but no subjective fever.  She had chronic swelling in her legs,  however she says the swelling is no worse than usual.  She has had some  transient sore throat and stuffy nose, however they have both resolved.  She  denies headache, neck stiffness, nausea, vomiting, and diarrhea.  Her  appetite has been poor.  She denies abdominal pain.  She has had no dysuria.  Her urine output has been fairly good.  She denies being exposed to any sick  contacts.  She has been compliant with  prednisone 10 mg daily.  The patient  has been on 10 mg daily for 3 months.  She is followed by Dr. Charlestine Night for  lupus and other primary care issues.   When the patient was evaluated in the emergency department, her temperature  was found to be 101.4, blood pressure 123/85, pulse 145, respiratory rate  18, and oxygen saturation 99% on 2 liters.  Her white blood cell count was  within normal limits at 7.8.  Her D-Dimer was elevated at 8.04.  Her chest x-  ray revealed bilateral pleural effusions, left lower lobe atelectasis versus  infiltrate, and possible pericardial effusion.  The patient will therefore  be admitted for further evaluation and management.   PAST MEDICAL HISTORY:  1. Systemic lupus erythematous.  2. 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 Chronic bilateral pleural effusions (  para pneumonic versus transudative     in the past).  4. Septic shock secondary to strep Pneumo bacteremia in January of 2005.     A. Respiratory failure requiring intubation.     B. Acute renal failure requiring venovenous hemodiafiltration.     C. 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  5. Hypertension.  6. Steroid-induced hyperglycemia.  7. Status post PEG placement in February of 2005 per Dr. Henrene Pastor, removed     several months ago.  8. History of thrombocytopenia.  9. Chronic history of elevated liver transaminases.  10.      Hypoalbuminemia.  11.      Chronic normocytic anemia.  12.      History of recurrent fevers in February of 2005, source unknown.   CURRENT MEDICATIONS:  1. Prednisone 10 mg daily.  2. Clonidine (the patient not sure of dose) 0.1 1.5 tablets q.h.s.   DICTATION ENDED HERE.                                                Rexene Alberts, M.D.    DF/MEDQ  D:  07/05/2004  T:  07/05/2004  Job:  [id number]

## 2011-04-06 NOTE — H&P (Signed)
NAME:  [name], [name]                      ACCOUNT NO.:  [id number]   MEDICAL RECORD NO.:  [id number]                   PATIENT TYPE:  INP   LOCATION:  3109                                 FACILITY:  [facility]   PHYSICIAN:  Rexene Alberts, M.D.                 DATE OF BIRTH:  1976-02-07   DATE OF ADMISSION:  11/10/2003  DATE OF DISCHARGE:                                HISTORY & PHYSICAL   PRIMARY CARE PHYSICIAN:  Matthias Hughs, M.D., [place], [place].   RHEUMATOLOGIST:  Lindaann Slough, M.D.   CHIEF COMPLAINT:  Diffuse muscle aches, left arm swelling and pain, left leg  swelling and pain, difficulty swallowing, and pain with swallowing.   HISTORY OF PRESENT ILLNESS:  [name] is a 35 year old lady with a past  medical history significant for lupus diagnosed in January 2003 and lupus  nephritis, and also a history of pneumonia who presented to the emergency  department with a 24-hour history of diffuse muscle aches in her neck, her  lower back, her abdomen, her legs, and her feet.  The patient notes that she  has had swelling in her left arm and her left leg for the past one to two  days.  The swelling started in her left foot and progressed to her left leg  up to her left thigh.  The pain in her left arm started in her hand and  progressed up to her upper arm.  She states that the pain and discomfort are  rate as 9-10/10 in intensity.  The pain is constant now in her left leg and  left arm, and less so in the rest of her body.  She has 4-5/10 pain in her  lower back, her neck, her chest, and her abdomen.  The patient denies any  trauma.  She denies any rash.  She denies any fever or chills. She also  states that her neck glands are swollen, and this makes it difficult for  her to swallow.  She also as noticed that she has had some thrush in her  mouth and sometimes has some pain with swallowing.  She had one to two loose  stools yesterday, but no outright diarrhea.  She  denies any bright red blood  per rectum.  She denies any black tarry stools.  She denies any painful  urination.  She denies any nausea or vomiting.  The patient has had a  headache, which is generalized, without any photophobia, without any aura  and without any focal tenderness.   The patient's last lupus flare up was approximately six months go.  She  states she was not hospitalized. She had been taking prednisone 20 mg daily  until approximately four to five weeks ago when it was changed to 10 mg  daily per Dr. Charlestine Night, her rheumatologist.  Since that time she states that  she has been weaker and has had a little  a more fatigue.   When the patient was evaluated in the emergency department she presented  with a blood pressure of 65/51, a pulse of 125, respiratory rate of 26 and  oxygenating at 100% on 2 liters.  She was treated with 2 liters of normal  saline, Solu-Cortef  and Motrin times one.  Currently her systolic blood  pressure is ranging between 90 and 100.   PAST MEDICAL HISTORY:  1. Lupus diagnosed in January 2003.  2. History of lupus nephritis.  3. History in pneumonia either in 2003 or 2004.  4. History of leukopenia and anemia per patient's history.   MEDICATIONS:  Prednisone 10 mg daily.   ALLERGIES:  SULFA and IRON PILLS.   SOCIAL HISTORY:  The patient is single.  She lives in [place] with her  boy friend.  She is not employed.  She states that she is disabled, but does  not receive disability benefits.  She completed high school.  She can read  and write.  She denies tobacco, drug and alcohol use.   FAMILY HISTORY:  The patient's father is 83 years of age and has no know  health problems.  Her mother died at 46 years of age secondary to a  myocardial infarction.  She has a brother who has no known health problems.  She has a sister who has asthma.   REVIEW OF SYSTEMS:  The review of systems is as above in the history of  present illness.  In addition, the  patient has had intermittent shortness of  breath with activity, but not orthopnea.  No pleurisy.  She has not had a  cough for any upper respiratory infection symptoms with the exception of  difficulty swallowing and a sore throat.   PHYSICAL EXAMINATION:  VITAL SIGNS: Temperature 98.4, blood pressure 90/45,  pulse 126 26, and oxygen saturation on room air 97%.  GENERAL APPEARANCE:  In general the patient is an average weight 35 year old  African-American woman who appears ill and in some distress.  HEENT:  Head is normocephalic and nontraumatic.  Pupils are equal, round and  react to light.  Extraocular movements are intact.  The patient has a faint  right eye ptosis.Marland Kitchen  Conjunctivae are clear.  Sclerae are white.  Tympanic  membranes are clear bilaterally.  Oropharynx reveals mildly dry mucous  membranes.  She does have posterior pharyngeal, and hard and soft palate  white exudates consistent with thrush.  No erythema noted.  No posterior  pharyngeal edema noted.  NECK:  Neck is supple, but there is tenderness over the paracervical muscles  and the bilateral trapezius muscles.  She does have bilateral submandibular  and supraclavicular adenopathy and adenitis.  LUNGS:  Lungs are clear primarily; however, she does have some decreased  breath sounds in the bases.  She is mildly tachypneic.  Mildly dyspneic.  HEART:  S1 and S2 with tachycardia.  ABDOMEN:  Positive bowel sounds, although hypoactive.  Soft, mildly tender  diffusely, no focal tenderness, no rebound,, and no masses palpated.  RECTAL AND GENITALIA:  Rectal and GU deferred.  EXTREMITIES/MUSCULOSKELETAL:  The patient has a very swollen, edematous,  erythematous left upper arm with less swelling in the forearm and the hand.  She has severely restricted range of motion of her left upper extremity.  Her arm is moderately tender throughout.  She does have mild synovitis of her MCP and DIP joints diffusely of her left hand.  Her left  leg has  approximately 2-3+  pitting and nonpitting edema extending down to her foot.  She is moderately tender over the lower leg and foot.  There is scant  erythema, but no signs of cellulitis.  The right upper extremity and right  lower extremity are not edematous, but mildly tender.  She has good range of  motion on the right.  She has mild tenderness to her lumbosacral muscles to  percussion.  No erythema or edema noted.  Pedal pulses are barely palpable  bilaterally.  No pretibial or pedal edema of her right leg.  NEUROLOGIC:  The patient is alert and oriented times three.  Cranial nerves  II-XII are intact with the exception of a possible ptosis on the right.  Strength is 5/5 throughout with exception of the left upper and left lower  extremity  because of pain.  Sensation is intact throughout.   LABORATORY DATA:  Admission laboratories:  Chest x-ray; bibasilar  infiltrate.  Urine pregnancy test is negative.  Sed rate is 60.  WBC 1.2,  RBC 3.03, hemoglobin 9.4, hematocrit 28.1, MCV 92.5, and platelets 40,000.  Absolute neutrophil count 1.0.  Sodium 138, potassium 6.2, chloride 107, CO2  20 glucose 72, BUN 49, and creatinine 2.8.  Total bilirubin 0.6, alkaline  phosphatase 56, SGOT 97, SGPT 42, total protein 5.6, albumin 2.9, and  calcium 6.5.   ASSESSMENT:  1. Diffuse myalgias with left upper extremity swelling and left lower     extremity swelling/edema.   The patient's signs and symptoms maybe secondary to lupus exacerbation.  I  am also concerned about a left upper extremity and left lower extremity deep  venous thrombosis.  The left arm and leg do not appear to be cellulitic,  although this is also a concern.  The patient denies noncompliance with her  prednisone.  It is important to note that the patient's prednisone was  titrated from 20 to 10 mg daily approximately a month ago.   1. Shock.   The patient's systolic blood pressure was 65 when she presented to the   emergency department .  This maybe secondary to adrenal insufficiency;  however, septic shock will also need to be considered.  The patient is  afebrile and has leukopenia.  She does have bibasilar infiltrates on chest x-  ray, and no history of cough.   1. Pancytopenia.   The patient's low white blood cell count, low red blood cell count, and low  platelet count maybe secondary to the lupus exacerbation.  I have spoken to  Dr. Charlestine Night and he states that this pancytopenia is not particularly unusual  in patients with lupus.   1. Acute renal insufficiency.   The patient has a blood urea nitrogen of 49 and a creatinine of 2.8 with a  potassium of 6.2.  I am unaware of the patient's baseline blood urea  nitrogen, and creatinine; however, she does have a history of lupus  nephritis in the past.   1. Elevated liver function tests.   The patient's elevated hepatic transaminases may be secondary to shock, but we will need to rule out a viral hepatitis etiology.   6 .  Hypoalbuminemia with hypocalcemia.   The patient appears to be depleted of nutritional stores, which is  surprising given the healthy appearance of this 35 year old African-American  woman.  We will consider checked an human immune virus serology.   PLAN:  1. The patient will be admitted to the ICU.  2. The patient received 2 liters of normal saline  in the ER as Solu-Cortef     100 mg IV times one.  3. The patient also received 800 mg of Motrin for pain.  4. Volume resuscitation will continue with normal saline at 500 mL an hour     until her systolic blood pressures are maintaining in the 90s and we will     reduce the IV fluids to 250 mL an hour.  5. We will add dopamine if needed.  6. We will continue steroid resuscitation with Solu-Medrol 60 mg IV q.6     hours.  7. We will. Treat the patient's dysphagia and possible oropharyngeal yeast     infection with Diflucan 150 mg IV  daily and then change to p.o. if      tolerated.  8. We will treat bibasilar infiltrates with azithromycin and Rocephin     empirically.  9. We will obtain blood cultures times two.  10.      We will obtain UA C&S.  11.      We will add a Foley catheter for strict Is and Os.  12.      We will check an ultrasound of the kidneys and the abdomen.  13.      We will check an ionized calcium level.  14.      We will discuss obtaining an HIV screen to rule out HIV infection.  15.      We will treat the patient's pain with Tylenol as needed, Toradol as     needed and morphine but only if her systolic blood pressure remains above     90, and Solu-Medrol as stated above.  16.      We will treat the patient empirically with Protonix 40 mg IV daily.  17.      We will cover the patient's elevated blood sugars with a sliding     scale insulin regimen.  18.      We will obtain a TSH, free T4, vitamin B12, folate, double-stranded     DNA, and a compliment panel.  19.  We will obtain venous Dopplers of the     left upper extremity and left lower extremity to rule out DVT.  60.      Dr. Charlestine Night has been consulted and he will see the patient in the     morning.                                                Rexene Alberts, M.D.    DF/MEDQ  D:  11/10/2003  T:  11/11/2003  Job:  [id number]   cc:   Matthias Hughs  [address]  [place]  [place] [address]  Fax: [place], M.D.

## 2011-04-06 NOTE — Discharge Summary (Signed)
NAME:  [name], [name]                      ACCOUNT NO.:  [id number]   MEDICAL RECORD NO.:  [id number]                   PATIENT TYPE:  IPS   LOCATION:  4008                                 FACILITY:  [facility]   PHYSICIAN:  Jarvis Morgan, M.D.                DATE OF BIRTH:  [birth year]   DATE OF ADMISSION:  12/01/2003  DATE OF DISCHARGE:  01/04/2004                                 DISCHARGE SUMMARY   COMMENT:  Admitted December 01, 2003, discharge January 04, 2004 to acute  services, Dr. Kandis Fantasia D. Metjian of [facility].   DISCHARGE DIAGNOSES:  1. Lupus with acute quadriplegia and myopathy.  2. Sepsis.  3. Acute renal failure, resolving.  4. Anemia with thrombocytopenia.  5. Dysphagia.  6. Hypertension.  7. Decreased nutritional storage.  8. Depression.  9. Steroid-induced diabetes mellitus.  10.      Gastroesophageal reflux disease.  11.      Fever of unknown origin.  12.      Elevated liver function studies.   HISTORY OF PRESENT ILLNESS:  [age]-year-old black female, long  history of lupus, admitted November 10, 2003 with diffuse muscle aches and  dysphagia.  Upon evaluation, BUN 49, placed on intravenous fluids, critical  care medicine, intravenous steroids were introduced, probable sepsis.  Maintained on a ventilator for a short time.  Venous Dopplers studies  negative for deep venous thrombosis.  Renal service, Dr. Ollen Gross C. Florene Glen,  for acute renal failure, responded to intravenous fluids.  She did receive  TNA nutritional support for a short time, later with placement of a  nasogastric feeding tube.  Modified barium swallow was completed the day of  admission to rehab services with results pending.  She did develop an ileus,  since resolved.  She was slowly weaned from her steroids with assistance per  rheumatology services.  Hematology consult, Dr. Rudell Cobb. Ennever, for  decreased platelet count of 15,000, responded with platelet transfusion to  106,000.   Blood pressure was monitored with multiple antihypertensive  medications.  She was total assist for mobility.  She was admitted for a  comprehensive rehab program.   PAST MEDICAL HISTORY:  Past medical history of.  1. Lupus diagnosed in January of 2003 per Dr. Lindaann Slough.  2. Pneumonia.  3. Hypertension.   ALLERGIES:  Allergy to SULFA.   HABITS:  Denies alcohol or tobacco.   MEDICATIONS PRIOR TO ADMISSION:  Motrin and prednisone.   PRIMARY MEDICAL DOCTOR:  Dr. Matthias Hughs of [facility].   SOCIAL HISTORY:  Lives with fiance in [place], used a cane prior to  admission, independent with activities of daily living.  She is applying for  disability.  Fiance works 2 p.m. to 11 p.m., patient alone during the day,  no local family; she has an aunt in [place] who has limited assistance.   HOSPITAL COURSE:  Patient with slow progressive gains while on rehab  services  with therapies initiated on a b.i.d. basis.  The following issues  were followed during patient's rehab course:  Pertaining to Ms. Greenley's  lupus with acute quadriplegia and myopia, she was followed by rheumatology  services.  Her prednisone dosages were adjusted accordingly.  Distally, she  still was having some weakness but she was standby assist for her ambulation  once in a standing position, needing assistance from sit-to-stand with  limited endurance and fatigue factors noted.  She was followed early on in  her hospital course by critical care medicine for sepsis, maintained on  antibiotic therapy and monitored.  She did have chest x-ray showing  bilateral pleural effusions.  There was some question that these probably  receiving thoracentesis and monitored, remaining stable.  Her acute renal  failure again was stable by latest labs, followed by renal services with  latest BUN 12, creatinine of 0.5.  A percutaneous gastrostomy was completed  due to ongoing dysphagia and long-term use of nasogastric tube, thus  she  tolerated this procedure well, continuing with PEG tube feeds, monitoring  residuals.  She had also been treated recently for an ileus with followup  abdominal flat plates being negative while on the rehab services.  She was  initially on subcutaneous Lovenox for deep venous thrombosis.  Venous  Doppler studies x2 were negative.  Lovenox was later discontinued due to  bouts of thrombocytopenia and monitored with good response and latest  platelet counts of 110,000.  Blood pressures were monitored with multiple  antihypertensive medications with followup by in-hospital hospitalists'  care.  Blood sugars monitored with steroid-induced diabetes mellitus while  her prednisone was adjusted per rheumatology services.  Patient with ongoing  bouts of fever of unknown origin, full workup of blood cultures, chest x-  rays, chemistries, all were essentially negative, other than an Enterococcus  urinary tract infection.  Infectious disease was consulted.  She was placed  on intravenous Unasyn and monitored with latest temperature of 102.3.  Due  to all these medical complications and limited therapies, it was felt the  patient should be transferred to acute care services under the direct care  of medicine and monitored.  All issues were discussed with the patient and  fiance.   Latest labs showed a hemoglobin of 8.5, hematocrit 25.3, WBC 4.9, platelets  110,000; sodium 130, potassium 4.0, BUN 12, creatinine of 0.5.  She would  remain on Unasyn as well as intravenous vancomycin and monitored.  Liver  function studies again were showing steady improvement but again would  receive followup.  She did receive a HIDA scan during her rehab stay that  was negative.  She was discharged in guarded condition.  Medications would  be ongoing per medicine service.      Lauraine Rinne, P.A.                     Jarvis Morgan, M.D.   DA/MEDQ  D:  01/04/2004  T:  01/04/2004  Job:  [id number]   cc:   Jarvis Morgan, M.D.  [address] [place]  [place] [address]  Fax: [phone]   Lindaann Slough, M.D.   [place] Marin Olp, M.D.  [address]  [place], [place] [address]  Fax: [phone]

## 2011-04-06 NOTE — H&P (Signed)
NAME:  [name], [name]                      ACCOUNT NO.:  [id number]   MEDICAL RECORD NO.:  [id number]                   PATIENT TYPE:  INP   LOCATION:  1825                                 FACILITY:  [facility]   PHYSICIAN:  Cyril Mourning, D.O.                 DATE OF BIRTH:  07/13/1976   DATE OF ADMISSION:  01/20/2004  DATE OF DISCHARGE:                                HISTORY & PHYSICAL   PRIMARY CARE PHYSICIAN:  Lindaann Slough, M.D.   CHIEF COMPLAINT:  Right-sided pain and shortness of breath.   HISTORY OF PRESENT ILLNESS:  This is a 35 year old female known to our  service who has recently been discharged on January 14, 2004, with history  of lupus and she had a prolonged course from December 22, until that time at  which time she had been in and out of the subacute care unit and had  initially come in with Pneumococcal sepsis, lupus nephritis requiring CV VHC  at the time.  A biopsy was performed that hospitalization.  As well she has  had pancytopenia throughout her course, she had been treated for her  pneumonia.  She has continued to have throughout her course episodes of  elevated temperature that was intermittent.  She had been evaluated  extensively by Michel Bickers, M.D. of infectious disease.  He last evaluated  her on January 10, 2004, at which time her fevers had resolved and no  recommendations to pursue the effusion were suggested at that time.  With  regard to her chronic pleural effusion.  No antibiotic therapy was  recommended and her course had been one of improvement, though, just to date  the patient, per friend, states that she has complaints of worsening  shortness of breath and pleuritic-type pain. She presented to the emergency  department here at [facility] and was found to have consolidation  in both of her lobes bilaterally.  She was tachycardic, appeared dehydrated,  and had an elevated white count.  Of note, she was seen by Dr. Charlestine Night  just  prior to discharge and her prednisone dosage was increased to 40 mg b.i.d.  She had been scheduled to see him on March 3, which was today, however, she  came to the emergency department to meet that appointment.   PAST MEDICAL HISTORY:  Significant for lupus with history of lupus nephritis  and pancytopenia as well as history of being on CV VHD earlier this  admission.  She has appointment with Rudell Cobb. Marin Olp, M.D. on March 10 for  evaluation of pancytopenia, probably a bone marrow biopsy.  She also has an  appointment with Donato Heinz, M.D. scheduled for today at 9:30, of  course she will not make this appointment since she is here.  She has a  history of Pneumococcal sepsis as described above with prolonged course of  hospitalization from December of 2004 until just recently.  She had required  a PEG tube placement due to dysphagia, although, this has improved and she  was last evaluated by speech who recommended advancing her to regular diet  which she has tolerated.   MEDICATIONS:  1. Avapro 150 mg once a day.  2. Atenolol 25 mg once a day.  3. Calcium carbonate 500 mg twice a day.  4. Prednisone 40 mg b.i.d.  5. Senna two tablets twice daily.  6. Ensure one can three times a day.  7. Actonel 35 once a week.   ALLERGIES:  SULFA, IRON pills.   SOCIAL HISTORY:  The patient is single.  She lives in [place] with her  fiancee.   FAMILY HISTORY:  His father is 35 years of age with no known health  problems.  Mother is 24 and died with MI.  She has a brother with no known  health problems and a sister who has asthma.   REVIEW OF SYSTEMS:  The patient has received some Dilaudid in the emergency  department.  Further review of systems was not able to be completed at this  time, however, I was able to ascertain that she had been doing well at home  up until today.  Her oral intake has not been the best, but she has been  able to take her medications as had been  directed.  She has had no nausea,  vomiting, or diarrhea.  Otherwise, further review of systems is deferred.   PHYSICAL EXAMINATION:  VITAL SIGNS:  Blood pressure in the emergency  department was 105/62, heart rate 130, respirations were 20 to 22, O2  saturations 95% on room air, TM AX 99.1 oral.  GENERAL:  The patient appears a little lethargic after having received  narcotics in the emergency department.  She is able to answer our questions.  She is not tachypneic.  She is quite tachycardic and appears dry with dry  oral mucosa.  HEENT:  No conjunctivae pallor.  NECK:  Supple and nontender.  CHEST:  Auscultation revealed diminished sounds at the bases, however, air  entry was fair otherwise.  HEART:  Auscultation revealed normal S1 and S2, tachycardic.  ABDOMEN:  Soft and nontender.  EXTREMITIES:  No edema.  NEUROLOGY:  The patient was somnolent after receiving narcotics in the  emergency department.   LABORATORY DATA:  CT scan of the chest informal review with the radiologist  revealed bilateral consolidations in the lower lobes, bilateral pleural  effusions.  This is the preliminary report.  I have asked for her to be  evaluated for CT-guided or ultrasound-guided thoracentesis.   White count 19,000, hemoglobin 13.5, platelet count of 128.  Differential  revealed 19% neutrophils.  The pH was 7.42, pCO2 39.  Creatinine 1.0.   IMPRESSION:  1. Febrile illness, suspect recurrent pneumonia based on imaging studies     with persistent effusions and a prior history of Pneumococcal sepsis and     pneumonia.  Present, we are going to treat her empirically with Zosyn and     Cipro for coverage of Nosocomial pneumonia.  I do intend to pursue     ultrasound-guided thoracentesis for definitive studies and culture of     this potential para pneumonic effusion.  2. Volume depletion.  3. Pancytopenia.  4. Immunosuppression.  5. Systemic lupus erythematosus. 6. Lupus nephritis.  7. Poor  nutrition.   PLAN:  We are going to admit the patient to a stepdown ICU, give her  generous IV hydration, and  continue to support her and assess her  clinically.  Initiate empiric antibiotics with Zosyn and Cipro for possible  acquired  pneumonia, pursue culture ultrasound-guided thoracentesis for definitive  diagnosis.  We are going to keep her NPO for now.  We are going to check  some blood cultures.  Hold off on narcotics until she is more awake.  We are  going to continue steroids as initially prescribed by Dr. Charlestine Night.  We may  need to stress dose her.                                                Cyril Mourning, D.O.    ESS/MEDQ  D:  01/20/2004  T:  01/21/2004  Job:  [id number]

## 2011-04-06 NOTE — Discharge Summary (Signed)
NAME:  LAKAN, EASTMAN                      ACCOUNT NO.:  [id number]   MEDICAL RECORD NO.:  [id number]                   PATIENT TYPE:  INP   LOCATION:  3017                                 FACILITY:  [facility]   PHYSICIAN:  Cyril Mourning, D.O.                 DATE OF BIRTH:  September 11, 1976   DATE OF ADMISSION:  01/04/2004  DATE OF DISCHARGE:  01/14/2004                                 DISCHARGE SUMMARY   PRIMARY CARE PHYSICIAN:  Lindaann Slough, M.D.   ADMISSION DIAGNOSIS:  As a transfer from subacute care unit, the patient was  transferred to the service of Dr. Dyann Kief for recurrent fevers.   DISCHARGE DIAGNOSES:  1. Intermittent fevers, etiology unclear, status post infectious disease     evaluation and rheumatology evaluation this admission with lupus     suspected.  2. Lupus nephritis.  3. Pancytopenia.  4. Anemia.  5. Deconditioning.  6. Status post percutaneous endoscopic gastrostomy (PEG) tube placement with     recommendations for __________  six weeks post placement.  7. Proteinuria.  8. Transiently elevated ANC.  9. Chronic and persistent bilateral pleural effusions.   CONSULTATIONS:  Infectious disease with Dr. Megan Salon, hematology Dr. Rudell Cobb. Ennever, nephrology Dr. Marval Regal.   DISPOSITION/FOLLOWUP:  The patient has an appointment with Dr. Marin Olp on  01/18/2004 at 10:00 a.m. for labs and  03/10 at 11:30 at [facility], with Dr. Charlestine Night at Thursday 03/03 at 10:00  and Dr. Marval Regal at  01/20/2004 at 9:30 a.m.   MEDICATIONS ON DISCHARGE:  Avapro 150 mg p.o. q.d., atenolol 25 mg once  daily; calcium carbonate 500 mg twice daily; prednisone 40 mg twice daily;  senna two tabs b.i.d.; Ensure one can  t.i.d., Actonel 35 mg once a week.   HISTORY OF PRESENT ILLNESS:  For details see H&P as dictated by Dr. Pablo Lawrence, otherwise briefly as follows.  Ms. Avilla is a 35 year old African  American female whose well know to our service ever since she left  ICU on  November 26, 2003 after suffering Pneumococcal pneumonia.  Since that time,  she has been transferred to the Rehab services on January 12 and our service  has been following her on an almost continuous basis since then.  She had  improved dramatically in terms of strength and stamina.  However her rehab  course had been complicated by recurrent fevers which had been present since  her transfer from the unit earlier in January 2005.  In spite of obvious  clinical improvement, her fever has remained a source of concern given the  fact that she still has under treated lupus with some degree of anemia.  She  was on prednisone and especially with her recent history of Pneumococcal  sepsis in December 2004 with ICU course on a ventilator and CVVHD, she has  improved quite slowly and she is being transferred to the medical  service  for further evaluation of fever.   HOSPITAL COURSE:  The patient was placed on the regular floor and underwent  evaluation via blood cultures and routine urinalysis and chest x-ray as well  as ID consultation for evaluation of these fevers as well she underwent  blood transfusion for anemia with 2 units of  PRBCs.  She had continued to  have spiking fevers.  She was evaluated by Dr. Megan Salon with recommendations  for evaluation off of antibiotics.  __________ .  However her fever did  subside and eventually she was afebrile towards the latter part of her  hospitalization.  Hematology/oncology was asked to assist in the evaluation  of her pancytopenia and probable evaluation by bone marrow biopsy as an  outpatient was a consideration.  Therefore she had been asked to follow up  with Dr. Marin Olp in the outpatient setting.  She remained off of antibiotics  for the rest of her hospitalization.  Her p.o. intake began to improve and  speech pathology after repeat assessment found her to have improved  swallowing function without aspiration or penetration of thin  liquids and  she was advanced to a regular diet with anticipation of removal of the PEG  prior to discharge home.  However it was determined that the PEG site needed  to heal and granulate at least six weeks prior to removal and she was  subsequently discharged home with followup as noted above.                                                Cyril Mourning, D.O.    ESS/MEDQ  D:  02/08/2004  T:  02/10/2004  Job:  [id number]

## 2011-04-06 NOTE — Op Note (Signed)
[name], SAKAMOTO            ACCOUNT NO.:  [id number]   MEDICAL RECORD NO.:  [id number]          PATIENT TYPE:  INP   LOCATION:  0003                         FAC[facility]   PHYSICIAN:  Domingo Pulse, M.D.  DATE OF BIRTH:  1976-01-05   DATE OF PROCEDURE:  01/09/2005  DATE OF DISCHARGE:                                 OPERATIVE REPORT   PREOPERATIVE DIAGNOSES:  1.  Renal insufficiency.  2.  Systemic lupus erythematosus.   POSTOPERATIVE DIAGNOSES:  1.  Renal insufficiency.  2.  Systemic lupus erythematosus.   PROCEDURE:  Open right renal biopsy.   ATTENDING SURGEON:  Domingo Pulse, M.D.   RESIDENT SURGEON:  Lynford Citizen, M.D.   ANESTHESIA:  General endotracheal.   ESTIMATED BLOOD LOSS:  Minimal.   DRAINS:  A 16 French Foley catheter to straight drainage.   COMPLICATIONS:  None.   INDICATIONS FOR PROCEDURE:  [name] is a pleasant 35 year old African  American female with known systemic lupus erythematosus.  She has  progressive renal insufficiency, and her nephrologist, Dr. Mercy Moore, has  requested a renal biopsy.  She has had some difficulty with bleeding, as her  PTT has remained elevated.  Therefore, we are asked to perform an open renal  biopsy where excellent hemostasis can be obtained without risk of  percutaneous biopsy and postprocedure bleeding.  Ms. Rhem understands the  risks, benefits, and alternatives of the procedure in detail, and informed  consent was obtained.   DESCRIPTION OF PROCEDURE:  Following identification by arm bracelet, the  patient was brought to the operating room and placed in the supine position.  Here, she underwent general endotracheal anesthesia and received  preoperative IV antibiotics.  She was then moved into the full right flank  up position using the beanbag.  The kidney rest was utilized, and the table  was flexed.  All pressure points were padded appropriately.  A 16 French  Foley catheter was placed to straight  drainage.  Sequential compression  devices were used for DVT prophylaxis.  Her entire right flank and abdomen  were then prepped with Betadine and draped in the usual sterile fashion.   We made an approximately 10-cm incision off of the tip of the 12th rib  towards the umbilicus.  Once the scalpel had been used to incise the skin  layer, Bovie electrocautery was used to carry the incision through the  subcutaneous tissue.  The ensuing muscle layers, including the external and  internal obliques as well as the transverse abdominis, were then opened in  an anatomical fashion to facilitate easy wound closure.  The retroperitoneum  was then entered.  The lumbodorsal fascia was swept medially.  The  peritoneum was not entered.  The inferior pole of the right kidney was then  easily isolated and defatted.  Metzenbaum dissection was used to clear an  adequate portion of the lower pole of the right kidney.  The kidney looked  grossly normal on examination.  We then placed two interrupted 2-0 Vicryl  sutures on either aspect of the biopsy spot to facilitate closure of the  renal capsule.  A scalpel was then used to take a wedge-shaped biopsy  approximately 1.5 cm deep.  The base of the surgical defect was then  cauterized using the argon beam.  There was still some persistent bleeding  from the base of the defect.  FloSeal was then used to obtain better  hemostasis.  A piece of Surgicel was then used as a bolster over the  surgical defect.  The two interrupted Vicryl sutures were then tied over the  bolster to provide compression and hemostasis.  A dry lap was placed over  the entire defect and held in place for approximately three minutes.  When  this was removed, there was no further evidence of bleeding.  The remainder  of the FloSeal was then applied over the Surgicel.  The fat was then  reapproximated over the defect.  Hemostasis was excellent, and there was no  evidence of further active  bleeding.  We then used a #1 PDS to close the  three muscle layers in an anatomic fashion.  Marcaine 0.5% with epinephrine  was then injected, approximately 20 cc, into the periosteum of the rib as  well as the subcutaneous tissues.  The incision was then irrigated with  sterile saline.  Any remaining subcutaneous bleeding was controlled using  the Bovie electrocautery.  A 5-0 Monocryl was then used to close the  incision in a subcuticular fashion.  Dermabond was then applied to complete  the wound closure.  The incision was washed and dried and a sterile dressing  applied.   The patient tolerated the procedure well.  There were no complications.  Please note that Dr. Amalia Hailey was present and participated in the entire  procedure, as he was the responsible surgeon.   DISPOSITION:  After awaking from general anesthesia, the patient was  transferred to the postanesthesia care unit in stable condition.  From here,  she will be transferred to the floor for further postoperative management.      EG/MEDQ  D:  01/09/2005  T:  01/09/2005  Job:  [id number]

## 2011-06-01 ENCOUNTER — Emergency Department (HOSPITAL_COMMUNITY)
Admission: EM | Admit: 2011-06-01 | Discharge: 2011-06-01 | Disposition: A | Discharge status: Home / Self Care | Payer: Medicare Other | Attending: Emergency Medicine | Admitting: Emergency Medicine

## 2011-06-01 ENCOUNTER — Encounter (HOSPITAL_COMMUNITY)
Admission: RE | Admit: 2011-06-01 | Discharge: 2011-06-01 | Payer: Medicare Other | Source: Ambulatory Visit | Attending: Nephrology | Admitting: Nephrology

## 2011-06-01 DIAGNOSIS — E119 Type 2 diabetes mellitus without complications: Secondary | ICD-10-CM | POA: Insufficient documentation

## 2011-06-01 DIAGNOSIS — F3289 Other specified depressive episodes: Secondary | ICD-10-CM | POA: Insufficient documentation

## 2011-06-01 DIAGNOSIS — I10 Essential (primary) hypertension: Secondary | ICD-10-CM | POA: Insufficient documentation

## 2011-06-01 DIAGNOSIS — N289 Disorder of kidney and ureter, unspecified: Secondary | ICD-10-CM | POA: Insufficient documentation

## 2011-06-01 DIAGNOSIS — M329 Systemic lupus erythematosus, unspecified: Secondary | ICD-10-CM | POA: Insufficient documentation

## 2011-06-01 DIAGNOSIS — F329 Major depressive disorder, single episode, unspecified: Secondary | ICD-10-CM | POA: Insufficient documentation

## 2011-06-01 DIAGNOSIS — Z79899 Other long term (current) drug therapy: Secondary | ICD-10-CM | POA: Insufficient documentation

## 2011-06-01 DIAGNOSIS — R609 Edema, unspecified: Secondary | ICD-10-CM | POA: Insufficient documentation

## 2011-06-01 LAB — POCT I-STAT, CHEM 8
Chloride: 106 mEq/L (ref 96–112)
Glucose, Bld: 108 mg/dL — ABNORMAL HIGH (ref 70–99)
Hemoglobin: 6.5 g/dL — CL (ref 12.0–15.0)
Potassium: 3.1 mEq/L — ABNORMAL LOW (ref 3.5–5.1)
TCO2: 19 mmol/L (ref 0–100)

## 2011-06-08 ENCOUNTER — Inpatient Hospital Stay (HOSPITAL_COMMUNITY): Payer: Medicare Other

## 2011-06-08 ENCOUNTER — Encounter: Payer: Self-pay | Admitting: Internal Medicine

## 2011-06-08 ENCOUNTER — Emergency Department (HOSPITAL_COMMUNITY): Payer: Medicare Other

## 2011-06-08 ENCOUNTER — Inpatient Hospital Stay (HOSPITAL_COMMUNITY)
Admission: EM | Admit: 2011-06-08 | Discharge: 2011-06-25 | DRG: 981 | Disposition: A | Discharge status: Skilled Nursing Facility | Payer: Medicare Other | Attending: Internal Medicine | Admitting: Internal Medicine

## 2011-06-08 DIAGNOSIS — E872 Acidosis, unspecified: Secondary | ICD-10-CM | POA: Diagnosis present

## 2011-06-08 DIAGNOSIS — I509 Heart failure, unspecified: Secondary | ICD-10-CM

## 2011-06-08 DIAGNOSIS — D649 Anemia, unspecified: Secondary | ICD-10-CM | POA: Diagnosis present

## 2011-06-08 DIAGNOSIS — E46 Unspecified protein-calorie malnutrition: Secondary | ICD-10-CM | POA: Diagnosis present

## 2011-06-08 DIAGNOSIS — I120 Hypertensive chronic kidney disease with stage 5 chronic kidney disease or end stage renal disease: Secondary | ICD-10-CM | POA: Diagnosis present

## 2011-06-08 DIAGNOSIS — M329 Systemic lupus erythematosus, unspecified: Secondary | ICD-10-CM | POA: Diagnosis present

## 2011-06-08 DIAGNOSIS — D638 Anemia in other chronic diseases classified elsewhere: Secondary | ICD-10-CM | POA: Diagnosis present

## 2011-06-08 DIAGNOSIS — Z8614 Personal history of Methicillin resistant Staphylococcus aureus infection: Secondary | ICD-10-CM

## 2011-06-08 DIAGNOSIS — D899 Disorder involving the immune mechanism, unspecified: Secondary | ICD-10-CM

## 2011-06-08 DIAGNOSIS — R5381 Other malaise: Secondary | ICD-10-CM | POA: Diagnosis present

## 2011-06-08 DIAGNOSIS — E039 Hypothyroidism, unspecified: Secondary | ICD-10-CM | POA: Diagnosis present

## 2011-06-08 DIAGNOSIS — F411 Generalized anxiety disorder: Secondary | ICD-10-CM | POA: Diagnosis present

## 2011-06-08 DIAGNOSIS — A419 Sepsis, unspecified organism: Secondary | ICD-10-CM | POA: Diagnosis present

## 2011-06-08 DIAGNOSIS — D72829 Elevated white blood cell count, unspecified: Secondary | ICD-10-CM | POA: Diagnosis not present

## 2011-06-08 DIAGNOSIS — N179 Acute kidney failure, unspecified: Secondary | ICD-10-CM | POA: Diagnosis present

## 2011-06-08 DIAGNOSIS — J84112 Idiopathic pulmonary fibrosis: Secondary | ICD-10-CM | POA: Diagnosis present

## 2011-06-08 DIAGNOSIS — N058 Unspecified nephritic syndrome with other morphologic changes: Secondary | ICD-10-CM | POA: Diagnosis present

## 2011-06-08 DIAGNOSIS — A0472 Enterocolitis due to Clostridium difficile, not specified as recurrent: Secondary | ICD-10-CM | POA: Diagnosis present

## 2011-06-08 DIAGNOSIS — E119 Type 2 diabetes mellitus without complications: Secondary | ICD-10-CM | POA: Diagnosis present

## 2011-06-08 DIAGNOSIS — R042 Hemoptysis: Secondary | ICD-10-CM

## 2011-06-08 DIAGNOSIS — N186 End stage renal disease: Secondary | ICD-10-CM | POA: Diagnosis present

## 2011-06-08 DIAGNOSIS — Z992 Dependence on renal dialysis: Secondary | ICD-10-CM

## 2011-06-08 DIAGNOSIS — J9600 Acute respiratory failure, unspecified whether with hypoxia or hypercapnia: Secondary | ICD-10-CM

## 2011-06-08 DIAGNOSIS — J189 Pneumonia, unspecified organism: Secondary | ICD-10-CM

## 2011-06-08 DIAGNOSIS — E876 Hypokalemia: Secondary | ICD-10-CM | POA: Diagnosis not present

## 2011-06-08 DIAGNOSIS — I498 Other specified cardiac arrhythmias: Secondary | ICD-10-CM | POA: Diagnosis not present

## 2011-06-08 LAB — DIFFERENTIAL
Basophils Absolute: 0 10*3/uL (ref 0.0–0.1)
Basophils Relative: 0 % (ref 0–1)
Eosinophils Absolute: 0.1 10*3/uL (ref 0.0–0.7)
Eosinophils Relative: 1 % (ref 0–5)
Lymphocytes Relative: 12 % (ref 12–46)
Lymphs Abs: 0.7 10*3/uL (ref 0.7–4.0)
Monocytes Absolute: 0.7 K/uL (ref 0.1–1.0)
Monocytes Relative: 12 % (ref 3–12)
Neutro Abs: 4.5 10*3/uL (ref 1.7–7.7)
Neutrophils Relative %: 75 % (ref 43–77)

## 2011-06-08 LAB — URINALYSIS, MICROSCOPIC ONLY
Bilirubin Urine: NEGATIVE
Glucose, UA: NEGATIVE mg/dL
Ketones, ur: NEGATIVE mg/dL
Nitrite: NEGATIVE
pH: 5 (ref 5.0–8.0)

## 2011-06-08 LAB — COMPREHENSIVE METABOLIC PANEL WITH GFR
ALT: 24 U/L (ref 0–35)
Albumin: 2.6 g/dL — ABNORMAL LOW (ref 3.5–5.2)
BUN: 43 mg/dL — ABNORMAL HIGH (ref 6–23)
CO2: 17 meq/L — ABNORMAL LOW (ref 19–32)
Calcium: 7.6 mg/dL — ABNORMAL LOW (ref 8.4–10.5)
Chloride: 104 meq/L (ref 96–112)
Creatinine, Ser: 3.25 mg/dL — ABNORMAL HIGH (ref 0.50–1.10)
Glucose, Bld: 178 mg/dL — ABNORMAL HIGH (ref 70–99)
Total Bilirubin: 0.2 mg/dL — ABNORMAL LOW (ref 0.3–1.2)

## 2011-06-08 LAB — CBC
HCT: 19.9 % — ABNORMAL LOW (ref 36.0–46.0)
Hemoglobin: 6.2 g/dL — CL (ref 12.0–15.0)
MCH: 25.7 pg — ABNORMAL LOW (ref 26.0–34.0)
MCHC: 31.2 g/dL (ref 30.0–36.0)
MCV: 82.6 fL (ref 78.0–100.0)
Platelets: 174 K/uL (ref 150–400)
RBC: 2.41 MIL/uL — ABNORMAL LOW (ref 3.87–5.11)
RDW: 19.9 % — ABNORMAL HIGH (ref 11.5–15.5)
WBC: 6 K/uL (ref 4.0–10.5)

## 2011-06-08 LAB — POCT I-STAT 7, (LYTES, BLD GAS, ICA,H+H)
Acid-base deficit: 10 mmol/L — ABNORMAL HIGH (ref 0.0–2.0)
Bicarbonate: 16.7 mEq/L — ABNORMAL LOW (ref 20.0–24.0)
Bicarbonate: 15.5 meq/L — ABNORMAL LOW (ref 20.0–24.0)
Calcium, Ion: 1.07 mmol/L — ABNORMAL LOW (ref 1.12–1.32)
HCT: 19 % — ABNORMAL LOW (ref 36.0–46.0)
HCT: 33 % — ABNORMAL LOW (ref 36.0–46.0)
Hemoglobin: 6.5 g/dL — CL (ref 12.0–15.0)
Hemoglobin: 11.2 g/dL — ABNORMAL LOW (ref 12.0–15.0)
O2 Saturation: 91 %
Patient temperature: 37
Patient temperature: 37
Potassium: 3.4 meq/L — ABNORMAL LOW (ref 3.5–5.1)
Sodium: 135 mEq/L (ref 135–145)
Sodium: 137 meq/L (ref 135–145)
TCO2: 17 mmol/L (ref 0–100)
TCO2: 16 mmol/L (ref 0–100)
pCO2 arterial: 26.6 mmHg — ABNORMAL LOW (ref 35.0–45.0)
pCO2 arterial: 31.1 mmHg — ABNORMAL LOW (ref 35.0–45.0)
pH, Arterial: 7.405 — ABNORMAL HIGH (ref 7.350–7.400)
pH, Arterial: 7.307 — ABNORMAL LOW (ref 7.350–7.400)
pO2, Arterial: 225 mmHg — ABNORMAL HIGH (ref 80.0–100.0)
pO2, Arterial: 65 mmHg — ABNORMAL LOW (ref 80.0–100.0)

## 2011-06-08 LAB — COMPREHENSIVE METABOLIC PANEL
AST: 31 U/L (ref 0–37)
Alkaline Phosphatase: 59 U/L (ref 39–117)
GFR calc Af Amer: 20 mL/min — ABNORMAL LOW (ref >60)
GFR calc non Af Amer: 16 mL/min — ABNORMAL LOW (ref >60)
Potassium: 3 mEq/L — ABNORMAL LOW (ref 3.5–5.1)
Sodium: 139 mEq/L (ref 135–145)
Total Protein: 5.4 g/dL — ABNORMAL LOW (ref 6.0–8.3)

## 2011-06-08 LAB — SODIUM, URINE, RANDOM: Sodium, Ur: 45 meq/L

## 2011-06-08 LAB — PRO B NATRIURETIC PEPTIDE: Pro B Natriuretic peptide (BNP): 34326 pg/mL — ABNORMAL HIGH (ref 0–125)

## 2011-06-08 LAB — POCT I-STAT 3, ART BLOOD GAS (G3+)
Acid-base deficit: 10 mmol/L — ABNORMAL HIGH (ref 0.0–2.0)
O2 Saturation: 93 %
TCO2: 19 mmol/L (ref 0–100)

## 2011-06-08 LAB — LACTIC ACID, PLASMA
Lactic Acid, Venous: 1.6 mmol/L (ref 0.5–2.2)
Lactic Acid, Venous: 2.1 mmol/L (ref 0.5–2.2)

## 2011-06-08 LAB — FERRITIN: Ferritin: 1339 ng/mL — ABNORMAL HIGH (ref 10–291)

## 2011-06-08 LAB — LIPASE, BLOOD: Lipase: 30 U/L (ref 11–59)

## 2011-06-08 LAB — GLUCOSE, CAPILLARY
Glucose-Capillary: 138 mg/dL — ABNORMAL HIGH (ref 70–99)
Glucose-Capillary: 150 mg/dL — ABNORMAL HIGH (ref 70–99)

## 2011-06-08 LAB — T4, FREE: Free T4: 1 ng/dL (ref 0.80–1.80)

## 2011-06-08 LAB — CREATININE, URINE, RANDOM: Creatinine, Urine: 80.35 mg/dL

## 2011-06-08 LAB — TSH
TSH: 2.409 u[IU]/mL (ref 0.350–4.500)
TSH: 1.852 u[IU]/mL (ref 0.350–4.500)

## 2011-06-08 LAB — TROPONIN I: Troponin I: <0.3 ng/mL (ref <0.30)

## 2011-06-08 LAB — FOLATE: Folate: 6.7 ng/mL

## 2011-06-08 LAB — CORTISOL: Cortisol, Plasma: 18.3 ug/dL

## 2011-06-08 NOTE — H&P (Signed)
Hospital Admission Note Date: 06/08/2011  Patient name: [name] Medical record number: [id number] Date of birth: 1976/04/21 Age: 35 y.o. Gender: female PCP: No primary provider on file.  Attending physician: Dr Eppie Gibson  Resident (R1): Dr. Dorthula Nettles  Pager: [phone] Resident (R3): Dr. Ina Homes  Pager: [phone]  Chief Complaint: Shortness of breath, productive cough, nausea, vomiting, and diarrhea  History of Present Illness: The patient is a 35 year old female with past medical history significant for lupus on chronic immunosuppressive therapy with prednisone and CellCept who [facility] department complaining of progressive cough. She reports a one-week history of worsening cough productive of yellow sputum. She denies hemoptysis. She notes increased shortness of breath particularly over the past 2 days prior to arrival; she notes she has difficulty lying flat on her back as this worsens her difficulty breathing. She admits to intermittent sweats and chills for the past 24 hours; she did not take her temperature at home. She also notes a mild sore throat but denies sinus congestion or increased drainage. She had a single episode of left-sided chest pain when lying on her back yesterday that she describes as a 3/10 mild pain that was worse with coughing and deep inspiration; this is now resolved.  She reports increased fatigue over the past 7-10 days.  She states that for the past 24 hours she has experienced nausea, vomiting, and diarrhea. She notes 3-4 episodes of watery diarrhea as well as 3-4 episodes of emesis. She states she has vomited mostly food and water. She denies hematemesis, bright red blood per rectum, or dark tarry stools. She has been able to tolerate food and fluids and has not experienced any further diarrhea or emesis this morning. She denies abdominal pain and recent antibiotic use.  She denies syncope, headaches, visual changes, abnormal vaginal  bleeding, or other complaints.  Her only recent travel includes a trip to [place] in [place] last month. She denies any travel abroad or other unusual exposures.  Meds: Prednisone 10 mg tabs 1 tab by mouth daily Clonidine 0.3 mg tabs 1 tab by mouth 3 times a day Metoprolol tartrate 100 mg tabs 1 tab by mouth twice a day Hydroxychloroquine 200 mg tabs 1 tab by mouth twice a day Potassium chloride 20 mEq one tablet by mouth twice a day CellCept 500 mg tabs 1 tab by mouth twice a day  Allergies: Sulfa (itching)  Past medical history: Lupus   - diagnosed in 2003   - Managed by Dr. 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 Moderate patchy interstitial fibrosis and tubular atrophy Left foot tibiocalcaneal fusion (08/2008)  Left pleural biopsy (06/2004): FIBRINOUS PLEURITIS WITH MILD MESOTHELIAL HYPERPLASIA. NO GRANULOMATOUS INFLAMMATION, ATYPIA, OR EVIDENCE OF MALIGNANCY IDENTIFIED.  Family history: Mother: Deceased at age 48 from MI Father: Alive, patient does not have a close relationship with him  and is unfamiliar with his medical history Sister with asthma and a brother who is healthy  Social history: Lives in [place] alone. She is in a relationship. She does not smoke, drink alcohol, or use illicit drugs. She has been  on disability since 2003 and has Medicare.  Review of Systems: Pertinent items are noted in HPI.  Physical Exam: Vital signs: T: 100.1, HR: 98, BP: 186/113, RR: 24, O2 sats: 95% on 2 L by [place] GEN: No apparent distress.  Alert and oriented x 3.  Pleasant, conversant, and cooperative to exam. HEENT: head is autraumatic and normocephalic.  Neck is supple without palpable masses or lymphadenopathy.  Vision intact.  EOMI.  PERRLA.  Sclerae anicteric.  Conjunctivae without pallor or injection. Mucous membranes are moist.  Oropharynx is without erythema, exudates, or other abnormal lesions.  RESP:  Coarse crackles throughout bilateral lung fields. No wheezing or rubs. Good air movement bilaterally. CARDIOVASCULAR: regular rate, normal rhythm.  Clear S1, S2, no murmurs, gallops, or rubs. ABDOMEN: soft, non-tender, non-distended.  Bowels sounds present in all quadrants and normoactive.  No palpable masses. EXT: warm and dry.  Peripheral pulses equal and intact; +2 in bilateral upper extremities, diminished in bilateral lower extremities.  No clubbing or cyanosis. Admitting can't pedal edema noted bilaterally, left greater than right.  +1 pitting edema extending up to knees in bilateral lower extremities.  SKIN: warm and dry with normal turgor.  No rashes or abnormal lesions observed. NEURO: CN II-XII grossly intact.  Muscle strength +5/5 in bilateral upper and lower extremities.  Sensation is grossly intact.  No focal deficit.   Lab results: Results for orders placed during the hospital encounter of 06/08/11 (from the past 24 hour(s))  DIFFERENTIAL     Status: Normal   Collection Time   06/08/11  6:00 AM      Component Value Range   Neutrophils Relative 75  43 - 77 (%)   Lymphocytes Relative 12  12 - 46 (%)   Monocytes Relative 12  3 - 12 (%)   Eosinophils Relative 1  0 - 5 (%)   Basophils Relative 0  0 - 1 (%)   Neutro Abs 4.5  1.7 - 7.7 (K/uL)   Lymphs Abs 0.7  0.7 - 4.0 (K/uL)   Monocytes  Absolute 0.7  0.1 - 1.0 (K/uL)   Eosinophils Absolute 0.1  0.0 - 0.7 (K/uL)   Basophils Absolute 0.0  0.0 - 0.1 (K/uL)   RBC Morphology RARE NRBCs     WBC Morphology MILD LEFT SHIFT (1-5% METAS, OCC MYELO, OCC BANDS)    CBC     Status: Abnormal   Collection Time   06/08/11  6:00 AM      Component Value Range   WBC 6.0  4.0 - 10.5 (K/uL)   RBC 2.41 (*) 3.87 - 5.11 (MIL/uL)   Hemoglobin 6.2 (*) 12.0 - 15.0 (g/dL)   HCT 19.9 (*) 36.0 - 46.0 (%)   MCV 82.6  78.0 - 100.0 (fL)   MCH 25.7 (*) 26.0 - 34.0 (pg)   MCHC 31.2  30.0 - 36.0 (g/dL)   RDW 19.9 (*) 11.5 - 15.5 (%)   Platelets 174  150 - 400 (K/uL)  TROPONIN I     Status: Normal   Collection Time   06/08/11  6:00 AM      Component Value Range   Troponin I <0.30  <0.30 (ng/mL)  COMPREHENSIVE METABOLIC PANEL     Status: Abnormal   Collection  Time   06/08/11  6:00 AM      Component Value Range   Sodium 139  135 - 145 (mEq/L)   Potassium 3.0 (*) 3.5 - 5.1 (mEq/L)   Chloride 104  96 - 112 (mEq/L)   CO2 17 (*) 19 - 32 (mEq/L)   Glucose, Bld 178 (*) 70 - 99 (mg/dL)   BUN 43 (*) 6 - 23 (mg/dL)   Creatinine, Ser 3.25 (*) 0.50 - 1.10 (mg/dL)   Calcium 7.6 (*) 8.4 - 10.5 (mg/dL)   Total Protein 5.4 (*) 6.0 - 8.3 (g/dL)   Albumin 2.6 (*) 3.5 - 5.2 (g/dL)   AST 31  0 - 37 (U/L)   ALT 24  0 - 35 (U/L)   Alkaline Phosphatase 59  39 - 117 (U/L)   Total Bilirubin 0.2 (*) 0.3 - 1.2 (mg/dL)   GFR calc non Af Amer 16 (*) >60 (mL/min)   GFR calc Af Amer 20 (*) >60 (mL/min)  LIPASE, BLOOD     Status: Normal   Collection Time   06/08/11  6:00 AM      Component Value Range   Lipase 30  11 - 59 (U/L)    Imaging results:  Dg Chest 2 View  06/08/2011  *RADIOLOGY REPORT*  Clinical Data: Chest pain, cough, and congestion.  History of lupus.  CHEST - 2 VIEW  Comparison: 09/01/2008  Findings: The patient has prominent bilateral patchy pulmonary infiltrates at the lung bases as well as in the left upper lobe. No discrete effusions.  Mild cardiomegaly.   Vascularity is normal.  IMPRESSION: Extensive bilateral pulmonary infiltrates.  Original Report Authenticated By: Larey Seat, M.D.   US Abdomen Complete  06/08/2011  *RADIOLOGY REPORT*  Clinical Data:  Abdominal pain and vomiting.  COMPLETE ABDOMINAL ULTRASOUND  Comparison:  None.  Findings:  Gallbladder:  No gallstones, gallbladder wall thickening, or pericholecystic fluid.  Common bile duct:  Normal.  1.8 mm in diameter.  Liver:  No focal lesion identified.  Within normal limits in parenchymal echogenicity.  IVC:  Appears normal.  Pancreas:  Normal.  Spleen:  Normal.  6.1 cm in length.  Right Kidney:  11.0 cm in length.  Increased echogenicity of the renal parenchyma.  Left Kidney:  11.0 cm in length.  Increased echogenicity of the renal parenchyma.  Abdominal aorta:  Normal.  1.9 cm in diameter.  IMPRESSION: The echogenic renal parenchyma which can be seen with renal medical disease.  Otherwise normal exam.  Original Report Authenticated By: Larey Seat, M.D.    Other results: EKG: normal sinus rhythm, ST segment flattening diffusely, prolonged QT interval.  Assessment & Plan by Problem:  Pneumonia, bilateral Patient's symptoms of fever, productive cough, malaise, and chest x-ray revealing patchy bilateral infiltrates are highly suggestive of pneumonia. She is chronically immunosuppressed as a result of the prednisone and CellCept she takes daily for management of her lupus.  Community acquired pneumonia he is certainly a possibility that she is at risk for opportunistic infections including fungal organisms and PCP. - Will treat empirically with Avelox for now with a very low threshold to broaden therapy if her status deteriorates - Blood cultures x2 - routine sputum culture and Gram stain - Sputum for fungal smear and culture - PCP DFA - Check Histoplasma urinary antigen, Legionella urinary antigen, and strep pneumo urinary antigen - Will monitor continuous pulse ox for at least  48 hours and will obtain ABG if patient develops hypoxia or other signs of clinical  deterioration - Tylenol for fever   Shortness of breath/hypoxia Patient shortness of breath is likely related to problem #1.  In the she acutely dropped her O2 sat to 85% early after her arrival, this quickly improved following administration of oxygen by nasal cannula and her O2 sat has remained above 90% for the remainder of her time in the ER. Her revised  Geneva score is 3, consistent with low probability for acute pulmonary embolus.  In light of a plausible explanation for her shortness of breath and hypoxia (bilateral pneumonia) as well as her low probability for PE, will not pursue further workup for this at this point. - Continuous pulse oximetry x48 hours - Further workup and treatment as described in problem #1  Acute kidney injury She has a biopsy proven diagnosis of lupus nephritis and is followed up by Dr. Mercy Moore on an outpatient basis. Her creatinine today is significantly elevated above previous measurements in the electronic medical record, however the most recent of these labs are from 2009.  It seems unlikely event her acute kidney injury is a result of dehydration as she appears well hydrated on exam and is hypertensive. - Urine sodium creatinine - Urinalysis with microscopy - Will contacted Dr. Etheleen Nicks office to obtain more recent lab data - Hold CellCept as this medication can cause and/or exacerbate acute kidney injury  Anemia, normocytic Patient reports a long history of anemia of unknown etiology. Her current hemoglobin is significantly below her baseline of 9 however she appears stable and I expect she has likely sustained a hemoglobin around 6 or 7 for a significant amount of time.  She denies any abnormal blood loss including rectal and vaginal bleeding. - Will obtain an anemia panel - Chek FOBT - Followup records from Dr. Etheleen Nicks office - Type and screen but will hold  transfusion as there are no emergent indications to transfuse at this time  Nausea, vomiting, and diarrhea I believe these symptoms are related to problem #1 but may also reflect a concomitant viral gastroenteritis. - Symptom management with Zofran  Lupus/ chronic steroid use - We'll continue patient's prednisone and check a cortisol level as she is at risk for adrenal insufficiency, and the need to monitor closely for - Will hold her CellCept as this medication can cause acute renal failure - Continue hydroxychloroquine   Hypokalemia - As was repleted with 40 mEq by mouth x1 in the emergency department - Basic metabolic panel in a.m. - Check mag level  Hypertension - Continue home medications  DVT prophylaxis: Lovenox 30 mg subcutaneous daily (will confirm adjusted dose with pharmacy)     R3______________________________      R1________________________________  ATTENDING: I performed and/or observed a history and physical examination of the patient.  I discussed the case with the residents as noted and reviewed the residents' notes.  I agree with the findings and plan--please refer to the attending physician note for more details.  Signature________________________________  Printed Name_____________________________

## 2011-06-09 ENCOUNTER — Inpatient Hospital Stay (HOSPITAL_COMMUNITY): Payer: Medicare Other

## 2011-06-09 DIAGNOSIS — N179 Acute kidney failure, unspecified: Secondary | ICD-10-CM

## 2011-06-09 DIAGNOSIS — J189 Pneumonia, unspecified organism: Secondary | ICD-10-CM

## 2011-06-09 LAB — HEPATITIS B CORE ANTIBODY, TOTAL: Hep B Core Total Ab: NEGATIVE

## 2011-06-09 LAB — POCT I-STAT 3, ART BLOOD GAS (G3+)
Acid-base deficit: 12 mmol/L — ABNORMAL HIGH (ref 0.0–2.0)
Acid-base deficit: 8 mmol/L — ABNORMAL HIGH (ref 0.0–2.0)
Patient temperature: 36.5
pCO2 arterial: 27.9 mmHg — ABNORMAL LOW (ref 35.0–45.0)
pH, Arterial: 7.35 (ref 7.350–7.400)
pH, Arterial: 7.367 (ref 7.350–7.400)
pO2, Arterial: 100 mmHg (ref 80.0–100.0)
pO2, Arterial: 71 mmHg — ABNORMAL LOW (ref 80.0–100.0)

## 2011-06-09 LAB — CBC
HCT: 16.3 % — ABNORMAL LOW (ref 36.0–46.0)
Hemoglobin: 5.3 g/dL — CL (ref 12.0–15.0)
MCH: 26.4 pg (ref 26.0–34.0)
MCHC: 31.9 g/dL (ref 30.0–36.0)
MCV: 81.9 fL (ref 78.0–100.0)
Platelets: 118 10*3/uL — ABNORMAL LOW (ref 150–400)
Platelets: 130 10*3/uL — ABNORMAL LOW (ref 150–400)
Platelets: 159 10*3/uL (ref 150–400)
RBC: 2.01 MIL/uL — ABNORMAL LOW (ref 3.87–5.11)
RBC: 3.33 MIL/uL — ABNORMAL LOW (ref 3.87–5.11)
RDW: 19.4 % — ABNORMAL HIGH (ref 11.5–15.5)
RDW: 17 % — ABNORMAL HIGH (ref 11.5–15.5)
WBC: 16.2 10*3/uL — ABNORMAL HIGH (ref 4.0–10.5)
WBC: 17.6 10*3/uL — ABNORMAL HIGH (ref 4.0–10.5)
WBC: 24.5 10*3/uL — ABNORMAL HIGH (ref 4.0–10.5)

## 2011-06-09 LAB — PHOSPHORUS: Phosphorus: 5.5 mg/dL — ABNORMAL HIGH (ref 2.3–4.6)

## 2011-06-09 LAB — DIFFERENTIAL
Basophils Absolute: 0 10*3/uL (ref 0.0–0.1)
Eosinophils Relative: 0 % (ref 0–5)
Lymphocytes Relative: 3 % — ABNORMAL LOW (ref 12–46)
Lymphs Abs: 0.5 10*3/uL — ABNORMAL LOW (ref 0.7–4.0)
Monocytes Relative: 7 % (ref 3–12)
Neutrophils Relative %: 90 % — ABNORMAL HIGH (ref 43–77)
WBC Morphology: INCREASED

## 2011-06-09 LAB — COMPREHENSIVE METABOLIC PANEL
AST: 15 U/L (ref 0–37)
Albumin: 1.8 g/dL — ABNORMAL LOW (ref 3.5–5.2)
BUN: 49 mg/dL — ABNORMAL HIGH (ref 6–23)
Calcium: 7.2 mg/dL — ABNORMAL LOW (ref 8.4–10.5)
Chloride: 105 mEq/L (ref 96–112)
Creatinine, Ser: 3.88 mg/dL — ABNORMAL HIGH (ref 0.50–1.10)
Total Bilirubin: 0.2 mg/dL — ABNORMAL LOW (ref 0.3–1.2)
Total Protein: 4.6 g/dL — ABNORMAL LOW (ref 6.0–8.3)

## 2011-06-09 LAB — BASIC METABOLIC PANEL
CO2: 21 mEq/L (ref 19–32)
Calcium: 7.6 mg/dL — ABNORMAL LOW (ref 8.4–10.5)
Chloride: 99 mEq/L (ref 96–112)
Creatinine, Ser: 2.73 mg/dL — ABNORMAL HIGH (ref 0.50–1.10)
GFR calc Af Amer: 24 mL/min — ABNORMAL LOW (ref >60)
Sodium: 135 mEq/L (ref 135–145)

## 2011-06-09 LAB — GLUCOSE, CAPILLARY
Glucose-Capillary: 170 mg/dL — ABNORMAL HIGH (ref 70–99)
Glucose-Capillary: 86 mg/dL (ref 70–99)
Glucose-Capillary: 92 mg/dL (ref 70–99)
Glucose-Capillary: 95 mg/dL (ref 70–99)

## 2011-06-09 LAB — IRON AND TIBC: UIBC: 154 ug/dL

## 2011-06-09 LAB — URINE CULTURE

## 2011-06-09 LAB — ABO/RH: ABO/RH(D): O POS

## 2011-06-09 LAB — HEPATITIS B SURFACE ANTIGEN: Hepatitis B Surface Ag: NEGATIVE

## 2011-06-09 LAB — MAGNESIUM
Magnesium: 1.5 mg/dL (ref 1.5–2.5)
Magnesium: 1.8 mg/dL (ref 1.5–2.5)

## 2011-06-09 LAB — PROCALCITONIN: Procalcitonin: 137.98 ng/mL

## 2011-06-10 ENCOUNTER — Inpatient Hospital Stay (HOSPITAL_COMMUNITY): Payer: Medicare Other

## 2011-06-10 DIAGNOSIS — I509 Heart failure, unspecified: Secondary | ICD-10-CM

## 2011-06-10 DIAGNOSIS — J9600 Acute respiratory failure, unspecified whether with hypoxia or hypercapnia: Secondary | ICD-10-CM

## 2011-06-10 DIAGNOSIS — R042 Hemoptysis: Secondary | ICD-10-CM

## 2011-06-10 DIAGNOSIS — J189 Pneumonia, unspecified organism: Secondary | ICD-10-CM

## 2011-06-10 LAB — BLOOD GAS, ARTERIAL
Bicarbonate: 21.4 mEq/L (ref 20.0–24.0)
Drawn by: 138981
FIO2: 0.3 %
O2 Saturation: 95.5 %
PEEP: 5 cmH2O
RATE: 14 resp/min
pCO2 arterial: 40.9 mmHg (ref 35.0–45.0)
pO2, Arterial: 73.9 mmHg — ABNORMAL LOW (ref 80.0–100.0)

## 2011-06-10 LAB — COMPREHENSIVE METABOLIC PANEL
ALT: 12 U/L (ref 0–35)
AST: 11 U/L (ref 0–37)
Albumin: 1.8 g/dL — ABNORMAL LOW (ref 3.5–5.2)
Alkaline Phosphatase: 45 U/L (ref 39–117)
CO2: 21 mEq/L (ref 19–32)
Chloride: 100 mEq/L (ref 96–112)
Creatinine, Ser: 3.27 mg/dL — ABNORMAL HIGH (ref 0.50–1.10)
GFR calc non Af Amer: 16 mL/min — ABNORMAL LOW (ref >60)
Potassium: 3.3 mEq/L — ABNORMAL LOW (ref 3.5–5.1)
Sodium: 135 mEq/L (ref 135–145)
Total Bilirubin: 0.3 mg/dL (ref 0.3–1.2)

## 2011-06-10 LAB — LEGIONELLA ANTIGEN, URINE: Legionella Antigen, Urine: NEGATIVE

## 2011-06-10 LAB — GLUCOSE, CAPILLARY
Glucose-Capillary: 184 mg/dL — ABNORMAL HIGH (ref 70–99)
Glucose-Capillary: 118 mg/dL — ABNORMAL HIGH (ref 70–99)
Glucose-Capillary: 177 mg/dL — ABNORMAL HIGH (ref 70–99)
Glucose-Capillary: 270 mg/dL — ABNORMAL HIGH (ref 70–99)
Glucose-Capillary: 101 mg/dL — ABNORMAL HIGH (ref 70–99)

## 2011-06-10 LAB — CRYPTOCOCCAL ANTIGEN: Crypto Ag: NEGATIVE

## 2011-06-10 LAB — CBC
Hemoglobin: 7.9 g/dL — ABNORMAL LOW (ref 12.0–15.0)
MCH: 27 pg (ref 26.0–34.0)
Platelets: 130 10*3/uL — ABNORMAL LOW (ref 150–400)
RBC: 2.93 MIL/uL — ABNORMAL LOW (ref 3.87–5.11)
WBC: 22.8 10*3/uL — ABNORMAL HIGH (ref 4.0–10.5)

## 2011-06-10 LAB — LACTIC ACID, PLASMA: Lactic Acid, Venous: 2.4 mmol/L — ABNORMAL HIGH (ref 0.5–2.2)

## 2011-06-10 LAB — MAGNESIUM: Magnesium: 1.8 mg/dL (ref 1.5–2.5)

## 2011-06-11 ENCOUNTER — Inpatient Hospital Stay (HOSPITAL_COMMUNITY): Payer: Medicare Other

## 2011-06-11 LAB — URINALYSIS, ROUTINE W REFLEX MICROSCOPIC
Bilirubin Urine: NEGATIVE
Glucose, UA: NEGATIVE mg/dL
Specific Gravity, Urine: 1.025 (ref 1.005–1.030)
pH: 5 (ref 5.0–8.0)

## 2011-06-11 LAB — CBC
HCT: 24.3 % — ABNORMAL LOW (ref 36.0–46.0)
Hemoglobin: 7.8 g/dL — ABNORMAL LOW (ref 12.0–15.0)
MCH: 26.7 pg (ref 26.0–34.0)
MCHC: 32.1 g/dL (ref 30.0–36.0)

## 2011-06-11 LAB — PROCALCITONIN: Procalcitonin: 76.72 ng/mL

## 2011-06-11 LAB — URINE MICROSCOPIC-ADD ON

## 2011-06-11 LAB — BLOOD GAS, ARTERIAL
Bicarbonate: 23.9 mEq/L (ref 20.0–24.0)
O2 Saturation: 94.5 %
Patient temperature: 98.6
TCO2: 25.4 mmol/L (ref 0–100)

## 2011-06-11 LAB — COMPREHENSIVE METABOLIC PANEL
ALT: 11 U/L (ref 0–35)
CO2: 24 mEq/L (ref 19–32)
Calcium: 8.1 mg/dL — ABNORMAL LOW (ref 8.4–10.5)
GFR calc Af Amer: 16 mL/min — ABNORMAL LOW (ref >60)
GFR calc non Af Amer: 13 mL/min — ABNORMAL LOW (ref >60)
Glucose, Bld: 183 mg/dL — ABNORMAL HIGH (ref 70–99)
Sodium: 136 mEq/L (ref 135–145)
Total Bilirubin: 0.2 mg/dL — ABNORMAL LOW (ref 0.3–1.2)

## 2011-06-11 LAB — CULTURE, BAL-QUANTITATIVE W GRAM STAIN

## 2011-06-11 LAB — GLUCOSE, CAPILLARY
Glucose-Capillary: 130 mg/dL — ABNORMAL HIGH (ref 70–99)
Glucose-Capillary: 141 mg/dL — ABNORMAL HIGH (ref 70–99)

## 2011-06-11 NOTE — Consult Note (Signed)
[name], [name] NO.:  [id number]  MEDICAL RECORD NO.:  [id number]  LOCATION:  2109                         FACILITY:  [facility]  PHYSICIAN:  Maudie Flakes. Hassell Done, M.D.   DATE OF BIRTH:  1976-05-17  DATE OF CONSULTATION:  06/08/2011 DATE OF DISCHARGE:                                CONSULTATION   PRIMARY CARE PROVIDER:  Unknown.  NEPHROLOGIST:  Windy Kalata, MD  CHIEF COMPLAINT:  Shortness of breath, productive cough.  HISTORY OF PRESENT ILLNESS:  This is a 35 year old black wonan who is currently intubated.  History provided by ED report, ICU team, and Dr. Etheleen Nicks office records.  The patient's past medical history is significant for SLE with diffuse proliferative lupus nephritis, hypertension, diabetes, anemia, hyperlipidemia.  She presented to the ED today with shortness of breath and productive cough times 1 week.  The patient is on chronic immunosuppressive therapy with prednisone and CellCept. The patient's lupus nephritis is followed by Dr. Mercy Moore.  Per Nephrology records, creatinine on May 2012 was 2.52, on June 2012 creatinine 3.03, on June 01, 2011 creatinine 2.9, and today's creatinine is 3.25.  Her weight in June 2011 was 154 pounds, in May 2012, the patient's weight was 164 pounds, and today's weight is 156 pounds.  The patient was also complaining of nausea, vomiting, and diarrhea.  The Renal consult requested by Dr. Eppie Gibson.  ALLERGIES:  SULFA and VANCOMYCIN.  MEDICATIONS PRIOR TO ADMISSION: 1. Prednisone 10 one tablet p.o. daily. 2. Clonidine was 0.3 one tablet p.o. t.i.d. 3. Metoprolol 100 mg 1 tablet p.o. b.i.d. 4. Hydroxychloroquine 200 mg 1 tablet p.o. b.i.d. 5. KCL 20 mEq 1 tablet p.o. b.i.d. 6. CellCept 500 mg 1 tablet p.o. b.i.d.  PAST MEDICAL HISTORY: 1. SLE. 2. Lupus nephritis. 3. Hypertension. 4. Hyperlipidemia. 5. Anemia. 6. History of avascular necrosis left tibial talus secondary to     chronic steroid  use.  PAST SURGICAL HISTORY: 1. I and D right gluteal abscess. 2. Kidney biopsy in 2006:  Mixed membranous and diffuse on necrotizing     and sclerosing nephritis. 3. Left foot tibiocalcaneal fusion.  SOCIAL HISTORY:  Lives alone, occupation unknown.  Denies alcohol, tobacco, or drug use.  FAMILY HISTORY:  Mother is deceased from an MI.  Father unknown. Siblings, sister has asthma and brother is healthy.  REVIEW OF SYSTEMS:  Per HPI.  PHYSICAL EXAMINATION:  VITAL SIGNS:  Temperature 97.8, pulse 93, respirations 22, blood pressure 163/110, pO2 100% on vent, and weight 72.5 kg. GENERAL:  Intubated. HEENT:  Deferred. NECK:  Supple.  No lymphadenopathy. CARDIOVASCULAR:  Regular rate and rhythm. LUNGS:  Diffuse crackles bilaterally. ABDOMEN:  Soft, obese, nontender, active bowel sounds. EXTREMITIES:  Edema, left greater than right, 4+ pitting edema. NEURO:  Deferred. MSK:  Deferred.  LABS AND STUDIES:  CBC, white count 6, hemoglobin 6.2, hematocrit 19.9, platelets 174.  BMET, sodium 139, potassium 3, chloride 104, CO2 17, BUN 43, creatinine 3.25, glucose 170.  Lactic acid 1.6, lipase 30.  Troponin less than 0.3.    Abdominal ultrasound showed echogenic renal parenchyma, right kidney and left kidney both 11 cm.    Chest x-ray: extensive bilateral pulmonary infiltrates.  ASSESSMENT AND  PLAN:  This is a 35 year old female with a history of lupus nephritis, who presents with shortness of breath and productive cough. 1. Lupus nephritis with creatinine increased from baseline of 2.5 on     Apr 03, 2011.  Will hold Cell Cept for now (while she's on     high dose steroids) and restart once pneumonia improves.  Continue with     hydrocortisone per primary team.  We will follow up the renal     panel in the morning.  We will continue to follow the patient's kidney function,     and diurese and Lasix for now.  No indication for dialysis at this     time.  2. Respiratory/ID.  The  patient is currently intubated and being managed by     Critical Care Team.  Continue antibiotics:  Linezolid, Zosyn,     Levaquin, Micafungin for pneumonia and possible PCP. 3. Anemia.  Hemoglobin is low at 6.2.  Last hemoglobin in May was 7.1.     Type and cross is pending.  Recommend transfusion. 4. Hypertension.  The patient has been on metoprolol as an outpatient     and we will give Lasix for now.  The patient appears to be fluid     overloaded.  We will give Lasix 80 mg IV q.6 hr.  Dialyze if no     response to lasix    ______________________________ Donnamarie Rossetti, MD   ______________________________ Maudie Flakes. Hassell Done, M.D.    ID/MEDQ  D:  06/08/2011  T:  06/09/2011  Job:  [id number]  Electronically Signed by Donnamarie Rossetti MD on 06/10/2011 09:50:55 AM Electronically Signed by Salem Senate M.D. on 06/11/2011 11:45:48 AM

## 2011-06-12 ENCOUNTER — Inpatient Hospital Stay (HOSPITAL_COMMUNITY): Payer: Medicare Other

## 2011-06-12 DIAGNOSIS — J189 Pneumonia, unspecified organism: Secondary | ICD-10-CM

## 2011-06-12 DIAGNOSIS — N179 Acute kidney failure, unspecified: Secondary | ICD-10-CM

## 2011-06-12 LAB — COMPREHENSIVE METABOLIC PANEL
Albumin: 1.9 g/dL — ABNORMAL LOW (ref 3.5–5.2)
Alkaline Phosphatase: 43 U/L (ref 39–117)
BUN: 48 mg/dL — ABNORMAL HIGH (ref 6–23)
Chloride: 96 mEq/L (ref 96–112)
Creatinine, Ser: 5.02 mg/dL — ABNORMAL HIGH (ref 0.50–1.10)
GFR calc Af Amer: 12 mL/min — ABNORMAL LOW (ref >60)
Glucose, Bld: 202 mg/dL — ABNORMAL HIGH (ref 70–99)
Total Bilirubin: 0.3 mg/dL (ref 0.3–1.2)
Total Protein: 5.2 g/dL — ABNORMAL LOW (ref 6.0–8.3)

## 2011-06-12 LAB — CBC
HCT: 20.3 % — ABNORMAL LOW (ref 36.0–46.0)
HCT: 25.3 % — ABNORMAL LOW (ref 36.0–46.0)
Hemoglobin: 6.8 g/dL — CL (ref 12.0–15.0)
MCH: 26.8 pg (ref 26.0–34.0)
MCHC: 33.5 g/dL (ref 30.0–36.0)
MCV: 82.5 fL (ref 78.0–100.0)
MCV: 80.6 fL (ref 78.0–100.0)
RBC: 3.14 MIL/uL — ABNORMAL LOW (ref 3.87–5.11)
RDW: 18.1 % — ABNORMAL HIGH (ref 11.5–15.5)
WBC: 20.1 10*3/uL — ABNORMAL HIGH (ref 4.0–10.5)

## 2011-06-12 LAB — BASIC METABOLIC PANEL
BUN: 51 mg/dL — ABNORMAL HIGH (ref 6–23)
CO2: 21 mEq/L (ref 19–32)
Chloride: 98 mEq/L (ref 96–112)
Creatinine, Ser: 5.27 mg/dL — ABNORMAL HIGH (ref 0.50–1.10)
Glucose, Bld: 165 mg/dL — ABNORMAL HIGH (ref 70–99)

## 2011-06-12 LAB — PREPARE RBC (CROSSMATCH)

## 2011-06-12 LAB — CULTURE, BAL-QUANTITATIVE W GRAM STAIN

## 2011-06-12 LAB — GLUCOSE, CAPILLARY
Glucose-Capillary: 141 mg/dL — ABNORMAL HIGH (ref 70–99)
Glucose-Capillary: 180 mg/dL — ABNORMAL HIGH (ref 70–99)

## 2011-06-12 LAB — PHOSPHORUS: Phosphorus: 5.4 mg/dL — ABNORMAL HIGH (ref 2.3–4.6)

## 2011-06-12 LAB — ASPERGILLUS GALACTOMANNAN ANTIGEN: Aspergillus galactomannan Index: 0.1 (ref <0.5)

## 2011-06-13 ENCOUNTER — Inpatient Hospital Stay (HOSPITAL_COMMUNITY): Payer: Medicare Other

## 2011-06-13 DIAGNOSIS — J189 Pneumonia, unspecified organism: Secondary | ICD-10-CM

## 2011-06-13 DIAGNOSIS — N179 Acute kidney failure, unspecified: Secondary | ICD-10-CM

## 2011-06-13 LAB — TYPE AND SCREEN
ABO/RH(D): O POS
Unit division: 0
Unit division: 0

## 2011-06-13 LAB — COMPREHENSIVE METABOLIC PANEL
ALT: 11 U/L (ref 0–35)
AST: 16 U/L (ref 0–37)
Alkaline Phosphatase: 52 U/L (ref 39–117)
CO2: 23 mEq/L (ref 19–32)
Calcium: 8.6 mg/dL (ref 8.4–10.5)
Chloride: 96 mEq/L (ref 96–112)
GFR calc non Af Amer: 10 mL/min — ABNORMAL LOW (ref >60)
Potassium: 3.8 mEq/L (ref 3.5–5.1)
Sodium: 133 mEq/L — ABNORMAL LOW (ref 135–145)

## 2011-06-13 LAB — CBC
MCH: 26.8 pg (ref 26.0–34.0)
Platelets: 100 10*3/uL — ABNORMAL LOW (ref 150–400)
RBC: 3.17 MIL/uL — ABNORMAL LOW (ref 3.87–5.11)
WBC: 17 10*3/uL — ABNORMAL HIGH (ref 4.0–10.5)

## 2011-06-13 LAB — GLUCOSE, CAPILLARY: Glucose-Capillary: 179 mg/dL — ABNORMAL HIGH (ref 70–99)

## 2011-06-13 LAB — PNEUMOCYSTIS JIROVECI SMEAR BY DFA: Pneumocystis jiroveci Ag: NEGATIVE

## 2011-06-14 LAB — CULTURE, BLOOD (ROUTINE X 2)
Culture  Setup Time: 201207201503
Culture  Setup Time: 201207201503
Culture: NO GROWTH
Culture: NO GROWTH

## 2011-06-14 LAB — RENAL FUNCTION PANEL
CO2: 25 mEq/L (ref 19–32)
Calcium: 8.6 mg/dL (ref 8.4–10.5)
Creatinine, Ser: 4.12 mg/dL — ABNORMAL HIGH (ref 0.50–1.10)
Glucose, Bld: 219 mg/dL — ABNORMAL HIGH (ref 70–99)

## 2011-06-14 LAB — CBC
Hemoglobin: 8.5 g/dL — ABNORMAL LOW (ref 12.0–15.0)
MCH: 26.4 pg (ref 26.0–34.0)
MCV: 81.4 fL (ref 78.0–100.0)
Platelets: 94 10*3/uL — ABNORMAL LOW (ref 150–400)
RBC: 3.22 MIL/uL — ABNORMAL LOW (ref 3.87–5.11)

## 2011-06-14 LAB — PROTIME-INR: Prothrombin Time: 13.6 seconds (ref 11.6–15.2)

## 2011-06-14 LAB — PLATELET FUNCTION ASSAY
Collagen / ADP: >300 seconds (ref 0–108)
Collagen / Epinephrine: >300 seconds (ref 0–197)

## 2011-06-14 LAB — GLUCOSE, CAPILLARY: Glucose-Capillary: 256 mg/dL — ABNORMAL HIGH (ref 70–99)

## 2011-06-15 ENCOUNTER — Inpatient Hospital Stay (HOSPITAL_COMMUNITY): Payer: Medicare Other

## 2011-06-15 DIAGNOSIS — N186 End stage renal disease: Secondary | ICD-10-CM

## 2011-06-15 DIAGNOSIS — I120 Hypertensive chronic kidney disease with stage 5 chronic kidney disease or end stage renal disease: Secondary | ICD-10-CM

## 2011-06-15 LAB — BASIC METABOLIC PANEL
Calcium: 8.6 mg/dL (ref 8.4–10.5)
GFR calc non Af Amer: 8 mL/min — ABNORMAL LOW (ref >60)
Sodium: 137 mEq/L (ref 135–145)

## 2011-06-15 LAB — GLUCOSE, CAPILLARY
Glucose-Capillary: 225 mg/dL — ABNORMAL HIGH (ref 70–99)
Glucose-Capillary: 123 mg/dL — ABNORMAL HIGH (ref 70–99)
Glucose-Capillary: 153 mg/dL — ABNORMAL HIGH (ref 70–99)

## 2011-06-15 LAB — CBC
MCH: 27.1 pg (ref 26.0–34.0)
MCHC: 33.3 g/dL (ref 30.0–36.0)
Platelets: 123 10*3/uL — ABNORMAL LOW (ref 150–400)
RDW: 17.4 % — ABNORMAL HIGH (ref 11.5–15.5)

## 2011-06-16 LAB — PLATELET FUNCTION ASSAY: Collagen / Epinephrine: 166 seconds (ref 0–184)

## 2011-06-16 LAB — GLUCOSE, CAPILLARY: Glucose-Capillary: 200 mg/dL — ABNORMAL HIGH (ref 70–99)

## 2011-06-17 LAB — CBC
HCT: 25.4 % — ABNORMAL LOW (ref 36.0–46.0)
MCHC: 32.7 g/dL (ref 30.0–36.0)
RDW: 16.9 % — ABNORMAL HIGH (ref 11.5–15.5)

## 2011-06-17 LAB — GLUCOSE, CAPILLARY: Glucose-Capillary: 166 mg/dL — ABNORMAL HIGH (ref 70–99)

## 2011-06-17 LAB — RENAL FUNCTION PANEL
Albumin: 2.6 g/dL — ABNORMAL LOW (ref 3.5–5.2)
BUN: 46 mg/dL — ABNORMAL HIGH (ref 6–23)
Calcium: 9.2 mg/dL (ref 8.4–10.5)
Phosphorus: 5.3 mg/dL — ABNORMAL HIGH (ref 2.3–4.6)
Potassium: 3.8 mEq/L (ref 3.5–5.1)

## 2011-06-18 ENCOUNTER — Inpatient Hospital Stay (HOSPITAL_COMMUNITY): Payer: Medicare Other

## 2011-06-18 ENCOUNTER — Other Ambulatory Visit: Payer: Self-pay | Admitting: Nephrology

## 2011-06-18 DIAGNOSIS — N19 Unspecified kidney failure: Secondary | ICD-10-CM

## 2011-06-18 DIAGNOSIS — Z01810 Encounter for preprocedural cardiovascular examination: Secondary | ICD-10-CM

## 2011-06-18 DIAGNOSIS — N179 Acute kidney failure, unspecified: Secondary | ICD-10-CM

## 2011-06-18 DIAGNOSIS — J189 Pneumonia, unspecified organism: Secondary | ICD-10-CM

## 2011-06-18 LAB — CBC
HCT: 20.9 % — ABNORMAL LOW (ref 36.0–46.0)
Hemoglobin: 8.1 g/dL — ABNORMAL LOW (ref 12.0–15.0)
Hemoglobin: 6.8 g/dL — CL (ref 12.0–15.0)
Hemoglobin: 6.8 g/dL — CL (ref 12.0–15.0)
MCH: 26.8 pg (ref 26.0–34.0)
MCH: 26.8 pg (ref 26.0–34.0)
MCHC: 32.5 g/dL (ref 30.0–36.0)
Platelets: 127 10*3/uL — ABNORMAL LOW (ref 150–400)
RBC: 3.01 MIL/uL — ABNORMAL LOW (ref 3.87–5.11)
RBC: 2.54 MIL/uL — ABNORMAL LOW (ref 3.87–5.11)
RBC: 2.54 MIL/uL — ABNORMAL LOW (ref 3.87–5.11)
WBC: 25.7 10*3/uL — ABNORMAL HIGH (ref 4.0–10.5)
WBC: 26 10*3/uL — ABNORMAL HIGH (ref 4.0–10.5)

## 2011-06-18 LAB — GLUCOSE, CAPILLARY
Glucose-Capillary: 158 mg/dL — ABNORMAL HIGH (ref 70–99)
Glucose-Capillary: 223 mg/dL — ABNORMAL HIGH (ref 70–99)
Glucose-Capillary: 196 mg/dL — ABNORMAL HIGH (ref 70–99)

## 2011-06-18 LAB — DIFFERENTIAL
Eosinophils Relative: 0 % (ref 0–5)
Lymphocytes Relative: 7 % — ABNORMAL LOW (ref 12–46)
Monocytes Relative: 13 % — ABNORMAL HIGH (ref 3–12)
Neutrophils Relative %: 79 % — ABNORMAL HIGH (ref 43–77)

## 2011-06-18 LAB — RENAL FUNCTION PANEL
BUN: 65 mg/dL — ABNORMAL HIGH (ref 6–23)
CO2: 26 mEq/L (ref 19–32)
CO2: 24 mEq/L (ref 19–32)
GFR calc Af Amer: 8 mL/min — ABNORMAL LOW (ref >60)
GFR calc non Af Amer: 6 mL/min — ABNORMAL LOW (ref >60)
Glucose, Bld: 177 mg/dL — ABNORMAL HIGH (ref 70–99)
Glucose, Bld: 249 mg/dL — ABNORMAL HIGH (ref 70–99)
Phosphorus: 5.6 mg/dL — ABNORMAL HIGH (ref 2.3–4.6)
Potassium: 4.1 mEq/L (ref 3.5–5.1)
Potassium: 4.3 mEq/L (ref 3.5–5.1)
Sodium: 138 mEq/L (ref 135–145)

## 2011-06-19 ENCOUNTER — Inpatient Hospital Stay (HOSPITAL_COMMUNITY): Payer: Medicare Other

## 2011-06-19 LAB — RENAL FUNCTION PANEL
Albumin: 2.6 g/dL — ABNORMAL LOW (ref 3.5–5.2)
CO2: 23 mEq/L (ref 19–32)
Chloride: 98 mEq/L (ref 96–112)
Creatinine, Ser: 8.84 mg/dL — ABNORMAL HIGH (ref 0.50–1.10)
GFR calc Af Amer: 6 mL/min — ABNORMAL LOW (ref >60)
GFR calc non Af Amer: 5 mL/min — ABNORMAL LOW (ref >60)
Sodium: 137 mEq/L (ref 135–145)

## 2011-06-19 LAB — CBC
HCT: 15.3 % — ABNORMAL LOW (ref 36.0–46.0)
HCT: 33.5 % — ABNORMAL LOW (ref 36.0–46.0)
Hemoglobin: 5 g/dL — CL (ref 12.0–15.0)
MCH: 27.2 pg (ref 26.0–34.0)
MCH: 28.9 pg (ref 26.0–34.0)
MCHC: 32.7 g/dL (ref 30.0–36.0)
MCV: 83.2 fL (ref 78.0–100.0)
MCV: 85.7 fL (ref 78.0–100.0)
Platelets: 200 10*3/uL (ref 150–400)
Platelets: 125 10*3/uL — ABNORMAL LOW (ref 150–400)
RBC: 1.84 MIL/uL — ABNORMAL LOW (ref 3.87–5.11)
RBC: 3.91 MIL/uL (ref 3.87–5.11)
RDW: 17.7 % — ABNORMAL HIGH (ref 11.5–15.5)
WBC: 30.2 10*3/uL — ABNORMAL HIGH (ref 4.0–10.5)
WBC: 24.4 10*3/uL — ABNORMAL HIGH (ref 4.0–10.5)

## 2011-06-19 LAB — GLUCOSE, CAPILLARY
Glucose-Capillary: 151 mg/dL — ABNORMAL HIGH (ref 70–99)
Glucose-Capillary: 118 mg/dL — ABNORMAL HIGH (ref 70–99)
Glucose-Capillary: 244 mg/dL — ABNORMAL HIGH (ref 70–99)

## 2011-06-19 LAB — LACTATE DEHYDROGENASE: LDH: 474 U/L — ABNORMAL HIGH (ref 94–250)

## 2011-06-19 LAB — PREPARE RBC (CROSSMATCH)

## 2011-06-20 DIAGNOSIS — N179 Acute kidney failure, unspecified: Secondary | ICD-10-CM

## 2011-06-20 DIAGNOSIS — J189 Pneumonia, unspecified organism: Secondary | ICD-10-CM

## 2011-06-20 LAB — GLUCOSE, CAPILLARY
Glucose-Capillary: 199 mg/dL — ABNORMAL HIGH (ref 70–99)
Glucose-Capillary: 172 mg/dL — ABNORMAL HIGH (ref 70–99)

## 2011-06-20 LAB — PTH, INTACT AND CALCIUM: Calcium, Total (PTH): 8.6 mg/dL (ref 8.4–10.5)

## 2011-06-20 LAB — CROSSMATCH
ABO/RH(D): O POS
Antibody Screen: NEGATIVE
Unit division: 0

## 2011-06-20 LAB — HAPTOGLOBIN: Haptoglobin: 150 mg/dL (ref 30–200)

## 2011-06-20 LAB — PATHOLOGIST SMEAR REVIEW

## 2011-06-20 LAB — CBC
HCT: 27.9 % — ABNORMAL LOW (ref 36.0–46.0)
Hemoglobin: 9.5 g/dL — ABNORMAL LOW (ref 12.0–15.0)
MCV: 85.1 fL (ref 78.0–100.0)
WBC: 25.1 10*3/uL — ABNORMAL HIGH (ref 4.0–10.5)

## 2011-06-20 LAB — HEPARIN INDUCED THROMBOCYTOPENIA PNL
Heparin Induced Plt Ab: NEGATIVE
Patient O.D.: 0.118

## 2011-06-21 ENCOUNTER — Inpatient Hospital Stay (HOSPITAL_COMMUNITY): Payer: Medicare Other

## 2011-06-21 DIAGNOSIS — I120 Hypertensive chronic kidney disease with stage 5 chronic kidney disease or end stage renal disease: Secondary | ICD-10-CM

## 2011-06-21 DIAGNOSIS — N186 End stage renal disease: Secondary | ICD-10-CM

## 2011-06-21 LAB — BASIC METABOLIC PANEL
CO2: 25 mEq/L (ref 19–32)
Chloride: 102 mEq/L (ref 96–112)
GFR calc non Af Amer: 8 mL/min — ABNORMAL LOW (ref >60)
Glucose, Bld: 155 mg/dL — ABNORMAL HIGH (ref 70–99)
Potassium: 3.4 mEq/L — ABNORMAL LOW (ref 3.5–5.1)
Sodium: 142 mEq/L (ref 135–145)

## 2011-06-21 LAB — GLUCOSE, CAPILLARY
Glucose-Capillary: 232 mg/dL — ABNORMAL HIGH (ref 70–99)
Glucose-Capillary: 210 mg/dL — ABNORMAL HIGH (ref 70–99)

## 2011-06-21 LAB — CBC
HCT: 26.6 % — ABNORMAL LOW (ref 36.0–46.0)
Hemoglobin: 8.9 g/dL — ABNORMAL LOW (ref 12.0–15.0)
RBC: 3.04 MIL/uL — ABNORMAL LOW (ref 3.87–5.11)
WBC: 26.2 10*3/uL — ABNORMAL HIGH (ref 4.0–10.5)

## 2011-06-21 LAB — SURGICAL PCR SCREEN: Staphylococcus aureus: NEGATIVE

## 2011-06-22 DIAGNOSIS — N179 Acute kidney failure, unspecified: Secondary | ICD-10-CM

## 2011-06-22 DIAGNOSIS — J189 Pneumonia, unspecified organism: Secondary | ICD-10-CM

## 2011-06-22 LAB — GLUCOSE, CAPILLARY
Glucose-Capillary: 338 mg/dL — ABNORMAL HIGH (ref 70–99)
Glucose-Capillary: 142 mg/dL — ABNORMAL HIGH (ref 70–99)

## 2011-06-22 LAB — BASIC METABOLIC PANEL
CO2: 22 mEq/L (ref 19–32)
Calcium: 9.7 mg/dL (ref 8.4–10.5)
GFR calc non Af Amer: 8 mL/min — ABNORMAL LOW (ref >60)
Sodium: 135 mEq/L (ref 135–145)

## 2011-06-22 LAB — CBC
Platelets: 152 10*3/uL (ref 150–400)
RBC: 3.07 MIL/uL — ABNORMAL LOW (ref 3.87–5.11)
WBC: 23.2 10*3/uL — ABNORMAL HIGH (ref 4.0–10.5)

## 2011-06-23 ENCOUNTER — Inpatient Hospital Stay (HOSPITAL_COMMUNITY): Payer: Medicare Other

## 2011-06-23 LAB — CBC
MCHC: 32.2 g/dL (ref 30.0–36.0)
RDW: 17.6 % — ABNORMAL HIGH (ref 11.5–15.5)

## 2011-06-23 LAB — GLUCOSE, CAPILLARY
Glucose-Capillary: 183 mg/dL — ABNORMAL HIGH (ref 70–99)
Glucose-Capillary: 183 mg/dL — ABNORMAL HIGH (ref 70–99)
Glucose-Capillary: 237 mg/dL — ABNORMAL HIGH (ref 70–99)
Glucose-Capillary: 334 mg/dL — ABNORMAL HIGH (ref 70–99)

## 2011-06-23 LAB — RENAL FUNCTION PANEL
Calcium: 9.3 mg/dL (ref 8.4–10.5)
GFR calc Af Amer: 9 mL/min — ABNORMAL LOW (ref >60)
GFR calc non Af Amer: 8 mL/min — ABNORMAL LOW (ref >60)
Phosphorus: 3 mg/dL (ref 2.3–4.6)
Sodium: 135 mEq/L (ref 135–145)

## 2011-06-24 LAB — GLUCOSE, CAPILLARY
Glucose-Capillary: 111 mg/dL — ABNORMAL HIGH (ref 70–99)
Glucose-Capillary: 256 mg/dL — ABNORMAL HIGH (ref 70–99)

## 2011-06-25 DIAGNOSIS — R5381 Other malaise: Secondary | ICD-10-CM

## 2011-06-25 LAB — PHOSPHORUS: Phosphorus: 3 mg/dL (ref 2.3–4.6)

## 2011-06-25 LAB — CBC
Hemoglobin: 8.3 g/dL — ABNORMAL LOW (ref 12.0–15.0)
MCH: 29.2 pg (ref 26.0–34.0)
Platelets: 139 10*3/uL — ABNORMAL LOW (ref 150–400)
RBC: 2.84 MIL/uL — ABNORMAL LOW (ref 3.87–5.11)
WBC: 21.1 10*3/uL — ABNORMAL HIGH (ref 4.0–10.5)

## 2011-06-25 LAB — GLUCOSE, CAPILLARY
Glucose-Capillary: 130 mg/dL — ABNORMAL HIGH (ref 70–99)
Glucose-Capillary: 250 mg/dL — ABNORMAL HIGH (ref 70–99)

## 2011-06-26 LAB — CULTURE, BLOOD (ROUTINE X 2)
Culture  Setup Time: 201208012131
Culture: NO GROWTH

## 2011-06-27 NOTE — Op Note (Signed)
  [name], [name]            ACCOUNT NO.:  [id number]  MEDICAL RECORD NO.:  [id number]  LOCATION:  [id number]                         FACILITY:  [facility]  PHYSICIAN:  Judeth Cornfield. Scot Dock, M.D.DATE OF BIRTH:  07/17/1976  DATE OF PROCEDURE:  06/15/2011 DATE OF DISCHARGE:                              OPERATIVE REPORT   PREOPERATIVE DIAGNOSIS:  Chronic kidney disease.  POSTOPERATIVE DIAGNOSIS:  Chronic kidney disease.  PROCEDURE:  Attempted placement of left IJ Diatek catheter.  FINDINGS: 1. Left IJ distally was appeared to be occluded. 2. Placement of a right IJ 27-cm Diatek catheter under ultrasound     guidance.  SURGEON:  Judeth Cornfield. Scot Dock, MD  ASSISTANT:  Nurse.  ANESTHESIA:  Local with sedation.  TECHNIQUE:  The patient was taken to the operating room and received a sedation.  The neck and upper chest were prepped and draped in usual sterile fashion.  After the skin was infiltrated with 1% lidocaine andunder ultrasound guidance, the left IJ was cannulated, however, the guidewire would not pass.  I tried an angled Glidewire also and there appeared to have been occlusion of the distal left IJ.  I therefore had to place a right-sided catheter.  The patient had a triple-lumen catheter in the right neck.  This was very high in the neck and therefore I could not simply change this over a wire.  The catheter was removed and pressure held for hemostasis.  Under ultrasound guidance, the right IJ was cannulated and the guidewire introduced into the superior vena cava under fluoroscopic control.  The tract over the wire was dilated and then a dilator and peel-away sheath were advanced over the wire and the wire and the dilator were removed.  Catheter was passed through the peel-away sheath and positioned in the right atrium.  The exit site for the catheter was selected and the skin anesthetized between the two areas.  The catheter was brought through the tunnel, cut to the  appropriate length, and the distal ports were attached.  Both ports withdrew easily.  We then flushed with heparinized saline and filled with concentrated heparin.  The catheter secured at its exit site with 3-0 nylon suture.  The IJ cannulation site was closed with 4-0 subcuticular stitch.  Sterile dressings were applied.  The patient tolerated the procedure well and was transferred to the recovery room in stable condition.  All needle and sponge counts were correct.     Judeth Cornfield. Scot Dock, M.D.     CSD/MEDQ  D:  06/15/2011  T:  06/15/2011  Job:  [id number]  Electronically Signed by [name] M.D. on 06/27/2011 09:32:47 AM

## 2011-06-27 NOTE — Consult Note (Signed)
[name], [name]            ACCOUNT NO.:  [id number]  MEDICAL RECORD NO.:  [id number]  LOCATION:  [id number]                         FACILITY:  [facility]  PHYSICIAN:  Judeth Cornfield. Scot Dock, M.D.DATE OF BIRTH:  1976-05-06  DATE OF CONSULTATION: DATE OF DISCHARGE:                                CONSULTATION   REASON FOR CONSULTATION:  Temporary and permanent hemodialysis access.  HISTORY OF PRESENT ILLNESS:  The patient is a 35 year old African American female who presented to the emergency room on June 08, 2011 with complaints of shortness of breath and productive cough x1 week. The patient has a history of proliferative lupus nephritis and has been on chronic immunosuppressive therapy with prednisone and CellCept.  The patient's lupus nephritis has been followed by Dr. Mercy Moore as an outpatient.  On presentation to the emergency room, the patient was also complaining of nausea, vomiting, and diarrhea.  Her past medical history is significant for lupus nephritis, hypertension, diabetes, anemia and hyperlipidemia.  Her creatinine has progressed from 2.5 in May 2012, to 3.25 on admission.  The patient was admitted and started on antibiotics for possible pneumonia.  Her creatinine has continued to increased and she was also started on hemodialysis via a temporary femoral catheter. Secondary to need for continuation of hemodialysis, a consult was obtained for a temporary Diatek catheter as well as permanent hemodialysis access.  The patient currently on hemodialysis and is without complaint.  She denied chest pain, increased shortness of breath, nausea, vomiting, diarrhea, constipation, TIA and CVA symptoms, and claudication symptoms.  PAST MEDICAL HISTORY: 1. Lupus. 2. Lupus nephritis. 3. Hypertension. 4. Hyperlipidemia. 5. Anemia. 6. 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 7. Pneumonia. 8. 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  PAST SURGICAL HISTORY: 1. I and D of a right gluteal abscess. 2. Kidney biopsy in 2006. 3. Left foot tibiocalcaneal fusion.  FAMILY HISTORY:  Her mother is deceased from myocardial infarction. Father unknown.  Siblings have asthma.  SOCIAL HISTORY:  The patient lives alone and denies alcohol and tobacco use.  ALLERGIES: 1. SULFA. 2. VANCOMYCIN.  MEDICATIONS AS AN INPATIENT: 1. Protonix 40 mg p.o. daily. 2. Clonidine 0.3 mg t.i.d. 3. CellCept 1000 mg p.o. b.i.d. 4. Aranesp 200 mcg subcu Tuesday. 5. Cipro 500 mg p.o. daily. 6. Sliding scale insulin. 7. Norvasc 10 mg p.o. daily. 8. PhosLo 667 mg p.o. t.i.d. 9. Nephro-Vite 1 p.o. b.i.d. 10.Labetalol 400 mg p.o. q. 12. 11.Minoxidil 5 mg p.o. b.i.d. 12.Prednisone 40 mg daily. 13.Acetaminophen 650 p.o. q.6 p.r.n.  REVIEW OF SYSTEMS:  Please see HPI for pertinent positives and negatives.  Otherwise, a complete review of systems is negative.  PHYSICAL EXAMINATION:  VITAL SIGNS:  Blood pressure 97/44, heart rate 80, temperature 97, O2 saturation 98% on 2 L via nasal cannula. GENERAL:  This is a well-developed female who appears to not feel well. HEENT:  Normocephalic, atraumatic.  PERRLA.  EOMI.  No bruits are auscultated. NECK:  Supple with no JVD. CARDIAC:  Regular rate and rhythm. LUNGS:  Decreased breath sounds at bases.  There is no rhonchi. ABDOMEN:  Soft, nontender, nondistended.  Normoactive bowel sounds. EXTREMITIES:  Minimal edema.  There are 2+ radial pulses bilaterally. Ulnar pulses are not palpable.  Motor and sensation intact in the bilateral upper extremities.  Bilateral lower extremities are warm and well perfused. NEURO:  Nonfocal. SKIN:  No evidence of rash or skin breakdown.  LABORATORY DATA:  CBC June 11, 2011, white count 13.3, hemoglobin 8.1, hematocrit 24.3, platelets 123.  BMP on June 13, 2011, sodium 137, potassium 3, BUN 55, creatinine 6.04, GFR 10.  PT/INR on June 14, 2011, 13.6/1.02.  IMAGING: 1. Chest x-ray on June 08, 2011, extensive bilateral pulmonary     infiltrates. 2. Abdominal ultrasound on June 08, 2011, echogenic renal parenchyma     which could be seen with renal medical disease, otherwise, normal     exam.  ASSESSMENT: 1. Lupus nephritis. 2. Hypertension. 3. Anemia. 4. Hyperlipidemia. 5. Progression of renal disease to end-stage with temporary femoral     catheter for hemodialysis.  PLAN: 1. All the patient's medical issues will continue to be monitored by     the Internal Medicine and Renal Teams. 2. The patient will be scheduled for a Diatek catheter today.  Vein     mapping will be performed and once this is reviewed, the patient     will be scheduled next week for permanent access.  Again, the     patient is right-handed, therefore, we will attempt permanent     access in the left upper extremity.     Leta Baptist, PA   ______________________________ Judeth Cornfield. Scot Dock, M.D.    AY/MEDQ  D:  06/15/2011  T:  06/15/2011  Job:  [id number]  Electronically Signed by Leta Baptist PA on 06/20/2011 09:30:39 AM Electronically Signed by [name] M.D. on 06/27/2011 09:32:42 AM

## 2011-06-27 NOTE — Op Note (Signed)
  [name], [name]            ACCOUNT NO.:  [id number]  MEDICAL RECORD NO.:  [id number]  LOCATION:                                 FACILITY:  PHYSICIAN:  Judeth Cornfield. Scot Dock, M.D.DATE OF BIRTH:  01/21/1976  DATE OF PROCEDURE: DATE OF DISCHARGE:                              OPERATIVE REPORT   PREOPERATIVE DIAGNOSIS:  Chronic kidney disease.  POSTOPERATIVE DIAGNOSIS:  Chronic kidney disease.  PROCEDURE:  Placement of a left upper arm loop arteriovenous graft.  SURGEON:  Judeth Cornfield. Scot Dock, MD  ASSISTANT:  Wray Kearns, PA-C  ANESTHESIA:  Local with sedation.  TECHNIQUE:  The patient had undergone preoperative vein mapping, which showed that the forearm cephalic, upper arm cephalic, and basilic vein were all very small and not usable for fistula.  I had interrogated with the duplex scanner myself, and the veins were too small for placement of a fistula.  Left upper extremity was prepped and draped in usual sterile fashion.  A longitudinal incision was made just above the antecubital level, and here the arteries and veins were dissected free.  The patient had a high bifurcation of the brachial artery.  The radial and ulnar branch at the antecubital level were very small.  The brachial veins were very small at this level.  Therefore, I elected to place an upper arm loop graft.  A separate longitudinal incision was made beneath the axilla after the skin was anesthetized.  The high brachial artery just above the bifurcation was reasonable size.  There were several small brachial veins.  The largest brachial vein was posterior and lateral to the artery.  Using this previous incision at the antecubital level, a 4- 7 mm graft was then tunneled in a loop fashion in the upper arm with the arterial aspect of the graft along the ulnar aspect of the upper arm. The patient was then heparinized.  The brachial artery was clamped proximally and distally, and a longitudinal  arteriotomy was made.  A segment of the 4 mm of the graft was excised.  The graft spatulated and sewn end-to-side to the artery using continuous 6-0 Prolene suture.  The graft was then pulled to appropriate length for anastomosis to the high brachial vein.  The vein was ligated distally and then spatulated proximally.  It took a 4.5-mm dilator.  The grafts were cut to appropriate length, spatulated, and sewn end-to-end to the vein using two continuous 6-0 Prolene sutures.  At the completion, there was an excellent thrill in the graft in a radial and ulnar signal with the Doppler.  Hemostasis was obtained in the wounds, and the wounds were closed with deep layer of 3-0 Vicryl.  The skin closed with 4-0 Vicryl.  Sterile dressing was applied.  The patient tolerated the procedure well, and was transferred to the recovery room in stable condition.  All needle and sponge counts were correct.     Judeth Cornfield. Scot Dock, M.D.     CSD/MEDQ  D:  06/21/2011  T:  06/22/2011  Job:  [id number]  Electronically Signed by [name] M.D. on 06/27/2011 09:32:51 AM

## 2011-07-06 LAB — FUNGUS CULTURE W SMEAR: Fungal Smear: NONE SEEN

## 2011-07-09 LAB — VIRUS CULTURE

## 2011-07-10 ENCOUNTER — Other Ambulatory Visit (HOSPITAL_COMMUNITY): Payer: Self-pay | Admitting: Internal Medicine

## 2011-07-17 NOTE — Discharge Summary (Signed)
[name], [name] NO.:  [id number]  MEDICAL RECORD NO.:  [id number]  LOCATION:  [id number]                         FACILITY:  [facility]  PHYSICIAN:  Jay Schlichter, M.D.  DATE OF BIRTH:  May 30, 1976  DATE OF ADMISSION:  06/08/2011 DATE OF DISCHARGE:  06/25/2011                              DISCHARGE SUMMARY   ATTENDING PHYSICIAN:  Oval Linsey, MD  DISCHARGE DIAGNOSES: 1. Lupus diagnosed in 2003 now with new end-stage renal disease. 2. End-stage renal disease secondary to lupus. 3. Anemia. 4. Respirator-dependent ventilatory failure, now resolved. 5. Tachycardia. 6. Leukocytosis. 7. Hypertension. 8. Diabetes, last A1c of 6.6.  DISCHARGE MEDICATIONS WITH ACCURATE DOSES: 1. Amlodipine 10 mg p.o. daily. 2. Benzonatate 100 mg p.o. t.i.d. as needed for cough. 3. Aranesp 200 mcg subcutaneously every Tuesday. 4. Vicodin 5/325 1-2 tablets p.o. every 4 hours as needed for pain. 5. Iron 325 mg p.o. b.i.d. with meals. 6. Labetalol 400 mg p.o. b.i.d. 7. CellCept 250 mg 2 tablets p.o. b.i.d. 8. Nepro with Carb 237 mL p.o. b.i.d. 9. Prednisone 20 mg p.o. daily. 10.Renal formula vitamin 1 tablet p.o. b.i.d. 11.Ambien 5 mg p.o. nightly as needed for sleep. 12.Hydroxychloroquine 200 mg p.o. b.i.d. 13.Potassium chloride 20 mEq p.o. b.i.d.  DISPOSITION AND FOLLOWUP: 1. This patient will be discharged to [facility] for short-term     rehab secondary to deconditioning and continued weakness. [place]:  This patient will be seen by Dr.     Mercy Moore, who is director at this kidney center for Tuesday,     Thursday, Saturday dialysis.  She has been instructed show up     Tuesday morning August 7 at 11 a.m. to fill up paper work for her     first dialysis, transportation to this point it should be provided     by her SNF. 3. Primary care:  This patient for first to be seen by Dr. Mercy Moore,     for all of her health issues, including management of her  lupus and     will be seen by him starting this week.  PROCEDURES PERFORMED: 1. Placement of a left upper arm looped arteriovenous graft by Dr.     Doren Custard, please see transcription from June 21, 2011 for greater     detail. 2. Placement of a right internal jugular Diatek catheter under     ultrasound guidance by Dr. Doren Custard, please see operative report from     July 27 for greater details. 3. A 2-D echocardiogram from June 10, 2011 showed an ejection fraction     of 65-70% with a moderately increased PA pressure at 55 mmHg with     no significant interval change from prior echocardiograms.  CONSULTATIONS: 1. Critical Care Medicine 2. Dr. Mercy Moore, Nephrology. 3. Infectious Disease.  BRIEF ADMITTING HISTORY AND PHYSICAL:  This is a 35 year old woman with past medical history significant for lupus, on chronic immunosuppressive therapy with prednisone and CellCept, who presented to the emergency room, complaining of a progressive cough.  She reported 1 week history of worsening cough and yellow sputum.  She denied hemoptysis.  She noted increased shortness of breath particularly over the 2 days prior  to admission with difficulty lying her flat or back that worsened her difficulty breathing.  She admitted to sweats and chills for 24 hours and she did not take her temperature at home.  She also had mild sore throat, but denies any sinus congestion or increased drainage.  She had one episode of left-sided chest pain when lying on her back, the day prior to admission that was worse with coughing and deep inspiration and had reported increase fatigue over the past 7-10 days.  She also stated that for 24 hours preceding admission she experienced nausea, vomiting and diarrhea, noting three to four episodes of watery diarrhea as well as three to four episodes of emesis.  The emesis was nonbloody and her stool had no blood.  She denied any abdominal pain or recent antibiotic use and she  denied any syncope, headaches, visual changes, abnormal vaginal bleeding or other complaints.  PHYSICAL EXAMINATION:  VITAL SIGNS:  Temperature of 100.1, heart rate of 98, blood pressure 186/113, respiratory rate 24, and O2 sat 95% on 2 liters. GENERAL:  She is in no acute distress.  Alert and oriented x3. RESPIRATORY:  Coarse crackles throughout bilateral lung fields.  No wheezing or rubs. CARDIOVASCULAR:  Regular rate and rhythm, normal S1 and S2.  No murmurs, rubs, or gallops. ABDOMEN:  Soft, nontender, nondistended.  Bowel sounds are present. EXTREMITIES:  Warm and dry with 1+ pitting edema extending up to the knees in the bilateral lower extremities. NEURO:  Cranial nerves were grossly intact.  Muscle strength was 5/5 in the bilateral upper and lower extremities.  Sensation is grossly intact. No focal deficits.  LABORATORY DATA:  CBC, white blood cell count 6.0, hemoglobin 6.2, hematocrit 19.9, and platelets 174.  Sodium 139, potassium 3.0, chloride 104, CO2 of 17, BUN 43, creatinine 3.25, and glucose 178.  Troponin was less than 0.305, lipase was 30.  IMAGING:  Chest x-ray showed bilateral patchy pulmonary infiltrates at the lung bases as well as in the left upper lobe.  Abdominal ultrasound showed echogenic renal parenchyma that could be seen with renal disease, otherwise normal.  EKG was normal sinus rhythm with some diffuse ST-segment flattening  HOSPITAL COURSE BY PROBLEM: 1. Shortness of breath.  This patient initially presented with mild     shortness of breath, satting well on 2 liters but in over a short     period of time in the emergency room decompensated and began having     increased oxygen requirement with worsening shortness of breath.     Given her immunosuppression and her known lupus and imaging     findings, it was decided that she should be sedated and intubated.     She was taken to the ICU where she was given a central line and     intubated.  At  this time, a bronchioalveolar lavage was also done.     The results of this lavage did not indicate any pulmonary     hemorrhage but eventually did not indicate any source of infection.     This patient was started on broad-spectrum antibiotics for presumed     pneumonia and Infectious Disease was consulted.  A multitude of     diagnostic testing was performed and over the coming days no tests     were positive for any bacteria viridans or fungi.  The patient     began to improve clinically, however and was able to be extubated     within 2-3 days.  Without any infectious etiology identified some     providers felt that she may have had a pulmonary hemorrhage despite     the BAL results.  Regardless, the patient continued to improve     clinically on antibiotics for a number of coming days and roughly     10-14 days later the ID service decided to discontinue her     antibiotics.  Thereafter, she continued to improve clinically and     by discharge was breathing comfortably on room air, satting upwards     of 98-100%.  Her chest x-ray did improve throughout the admission     as well and by the time of discharge it showed only scant basilar     infiltrate that looked to be resolving.  The patient did clinically    continued cough up clear sputum at the time of discharge. 2. Acute-on-chronic kidney injury, this is a known lupus patient     diagnosed by Dr. Mercy Moore, first in 2003.  Prior clinic notes     indicate questionable compliance with Aranesp therapy and possibly     immunosuppressive therapy.  The patient's creatinine on admission     in 3s, but continued to rise into 4s, 5s, and 6s despite treatment.     The patient was started on hemodialysis and after extubation was     eventually fit for an AV graft, it was felt that her renal function     may not recover and so biopsy was done which indeed confirmed     sclerosis and fibrosis of the kidney with no sign of acute process.     Dr.  Mercy Moore, felt that this patient would likely be on chronic     dialysis and potentially needing kidney transplant in the distant     future and so it was arranged for her to get a temporary dialysis     catheter as well as permanent dialysis access both of which were     done in the hospital.  At the time of discharge the patient's     creatinine was still in the 6s and she was set up for Tuesday,     Thursday, Saturday schedule for hemodialysis. 3. Hypertension.  During the initial weeks of this admission, the     patient's blood pressure was quite labile and extremely difficult     to control.  She at one point required minoxidil, clonidine,     labetalol, and amlodipine simply to keep her pressures under 160.     The nephrology team felt this was due to the acute vasospasm phase     of acute renal failure.  Indeed, the blood pressure did improve and     by discharge required only Norvasc and labetalol treatment. 4. Tachycardia for the final 6 days of admission the patient had noted     persistent tachycardia running in the 100s-120s, it was felt that     this was likely multifactorial in etiology related to combination     of anemia, anxiety, pain from multiple procedures and possible     intravascular hypovolemia given the patient's orthostatic vital     signs, pulmonary embolism was also considered, but the patient was     not scanned given that other diagnoses were more likely.  The     patient was discharged with a final heart rate in the low 100s. 5. Leukocytosis.  This patient also had a persistent leukocytosis  throughout the duration of admission that again was likely     multifactorial, it initially was contributed to possible infection     as well as steroid use.  However, once her pulmonary infiltrates     had resolved and steroids were in fact decreased, the white count     continued to remain elevated.  She did remain on some steroids and     this was thought to be  partially contributory.  She also had had     both a biopsy and AV graft surgery, so this could be contributing     to her white count as well.  Repeat blood cultures, off antibiotics     were negative, and the patient remained afebrile and clinically     looked quite stable, so it was felt that she was most likely not     infected towards the end of her hospitalization. 6. Anemia.  This patient was anemic down to the low 60s on admission     and did require blood transfusion.  She bumped appropriately and     did not decrease significantly beyond that initial presentation.     She had a questionable compliance with Aranesp therapy but was     restarted on in the hospital.  She will also be started on iron as     an outpatient. 7. Diarrhea.  This patient did present with diarrhea.  She was C.diff     negative and it resolved slowly throughout the duration of her     hospitalization. 8. Diabetes.  This patient was found to have a hemoglobin A1c of 6.6     given.  Given that she is known end-stage renal patient.  She will     be diet controlled for the time being, this should be rechecked in     roughly 3 months and if it is above 7 and some treatment can be     considered. 9. Thrombocytopenia.  This patient was noted to be thrombocytopenic     during her stay thought secondary to either heparin-induced     thrombocytasthenia or possibly linezolid antibiosis.  Once her     linezolid was discontinued, her thrombocytopenia resolved and her     HIT panel returned back negative.  DISCHARGE LABS AND VITALS:  Vital signs, temperature 98.5, pulse of 105, blood pressure 139/88, respirations 20, and satting 95% on room air.  Labs, white blood cell count 21.1, hemoglobin 8.3, hematocrit 26.1, platelets 139.  Again, this patient will be discharged in stable condition to the [facility] for acute rehab.  She will begin hemodialysis at [facility] starting  tomorrow morning that is Tuesday, August 7, reporting 11 a.m., transportation will be provided by her SNF.  There she will be seeing Dr. Mercy Moore, her primary nephrologist for continued management of her lupus as well as general health issues.    ______________________________ Rich Reining, MD   ______________________________ Jay Schlichter, M.D.    BW/MEDQ  D:  06/25/2011  T:  06/25/2011  Job:  [id number]  cc:   [facility] Windy Kalata, M.D.  Electronically Signed by Rich Reining MD on 07/06/2011 12:09:59 PM Electronically Signed by Bertha Stakes M.D. on 07/17/2011 06:55:04 PM

## 2011-07-18 ENCOUNTER — Ambulatory Visit (HOSPITAL_COMMUNITY)
Admission: RE | Admit: 2011-07-18 | Discharge: 2011-07-18 | Disposition: A | Discharge status: Home / Self Care | Payer: Medicare Other | Source: Ambulatory Visit | Attending: Internal Medicine | Admitting: Internal Medicine

## 2011-07-18 DIAGNOSIS — R05 Cough: Secondary | ICD-10-CM | POA: Insufficient documentation

## 2011-07-18 DIAGNOSIS — R059 Cough, unspecified: Secondary | ICD-10-CM | POA: Insufficient documentation

## 2011-07-18 DIAGNOSIS — R1310 Dysphagia, unspecified: Secondary | ICD-10-CM | POA: Insufficient documentation

## 2011-07-22 LAB — AFB CULTURE WITH SMEAR (NOT AT ARMC): Acid Fast Smear: NONE SEEN

## 2011-08-08 ENCOUNTER — Ambulatory Visit (HOSPITAL_COMMUNITY): Payer: Medicare Other | Attending: Vascular Surgery

## 2011-08-08 DIAGNOSIS — Z452 Encounter for adjustment and management of vascular access device: Secondary | ICD-10-CM | POA: Insufficient documentation

## 2011-08-08 DIAGNOSIS — N186 End stage renal disease: Secondary | ICD-10-CM

## 2011-08-15 LAB — BASIC METABOLIC PANEL
BUN: 45 — ABNORMAL HIGH
CO2: 23
Chloride: 104
Creatinine, Ser: 1.4 — ABNORMAL HIGH
Potassium: 3.6

## 2011-08-15 LAB — CBC
HCT: 35.8 — ABNORMAL LOW
MCHC: 33.1
MCV: 83.7
Platelets: 194
RBC: 4.28
WBC: 13.5 — ABNORMAL HIGH

## 2011-08-15 LAB — URINALYSIS, ROUTINE W REFLEX MICROSCOPIC
Bilirubin Urine: NEGATIVE
Glucose, UA: NEGATIVE
Ketones, ur: NEGATIVE
Protein, ur: NEGATIVE
pH: 5.5

## 2011-08-15 LAB — PREGNANCY, URINE: Preg Test, Ur: NEGATIVE

## 2011-08-16 LAB — IRON AND TIBC
Iron: 56
Saturation Ratios: 29
UIBC: 137

## 2011-08-16 LAB — HEMOGLOBIN AND HEMATOCRIT, BLOOD: Hemoglobin: 10.6 — ABNORMAL LOW

## 2011-08-20 LAB — COMPREHENSIVE METABOLIC PANEL
ALT: 22
AST: 22
CO2: 28
Calcium: 9.1
Chloride: 105
GFR calc Af Amer: >60
GFR calc non Af Amer: >60
Sodium: 142
Total Bilirubin: 0.8

## 2011-08-20 LAB — PROTIME-INR
INR: 0.9
Prothrombin Time: 12.1

## 2011-08-20 LAB — CBC
HCT: 37.2
Hemoglobin: 11.9 — ABNORMAL LOW
MCHC: 31.9
MCV: 82.7
RBC: 4.5
RDW: 17.3 — ABNORMAL HIGH

## 2011-08-20 LAB — GLUCOSE, CAPILLARY
Glucose-Capillary: 112 — ABNORMAL HIGH
Glucose-Capillary: 183 — ABNORMAL HIGH

## 2011-08-20 LAB — APTT: aPTT: 25

## 2011-08-20 LAB — BASIC METABOLIC PANEL
BUN: 17
Calcium: 8.6
GFR calc non Af Amer: 47 — ABNORMAL LOW
Potassium: 3.3 — ABNORMAL LOW

## 2011-08-20 LAB — IRON AND TIBC: UIBC: 157

## 2011-08-22 LAB — POCT HEMOGLOBIN-HEMACUE: Hemoglobin: 9.2 — ABNORMAL LOW

## 2011-08-24 LAB — ANAEROBIC CULTURE

## 2011-08-24 LAB — BASIC METABOLIC PANEL
BUN: 14
BUN: 18
Calcium: 8.4
Chloride: 110
Creatinine, Ser: 1.05
GFR calc Af Amer: >60
GFR calc non Af Amer: 60 — ABNORMAL LOW
GFR calc non Af Amer: >60
Potassium: 3.4 — ABNORMAL LOW
Sodium: 142

## 2011-08-24 LAB — COMPREHENSIVE METABOLIC PANEL
Alkaline Phosphatase: 65
BUN: 20
CO2: 27
Chloride: 107
Creatinine, Ser: 0.91
GFR calc non Af Amer: >60
Potassium: 2.7 — CL
Total Bilirubin: 0.6

## 2011-08-24 LAB — WOUND CULTURE

## 2011-08-24 LAB — CBC
HCT: 28.1 — ABNORMAL LOW
Hemoglobin: 9.3 — ABNORMAL LOW
MCV: 80.7
RBC: 3.48 — ABNORMAL LOW
WBC: 12 — ABNORMAL HIGH

## 2011-11-05 ENCOUNTER — Other Ambulatory Visit (HOSPITAL_COMMUNITY): Payer: Self-pay | Admitting: Nephrology

## 2011-11-05 DIAGNOSIS — N186 End stage renal disease: Secondary | ICD-10-CM

## 2011-11-09 ENCOUNTER — Ambulatory Visit (HOSPITAL_COMMUNITY)
Admission: RE | Admit: 2011-11-09 | Discharge: 2011-11-09 | Disposition: A | Discharge status: Home / Self Care | Payer: Medicare Other | Source: Ambulatory Visit | Attending: Nephrology | Admitting: Nephrology

## 2011-11-09 ENCOUNTER — Other Ambulatory Visit (HOSPITAL_COMMUNITY): Payer: Self-pay | Admitting: Nephrology

## 2011-11-09 DIAGNOSIS — N186 End stage renal disease: Secondary | ICD-10-CM

## 2011-11-09 DIAGNOSIS — T82898A Other specified complication of vascular prosthetic devices, implants and grafts, initial encounter: Secondary | ICD-10-CM | POA: Insufficient documentation

## 2011-11-09 DIAGNOSIS — Y849 Medical procedure, unspecified as the cause of abnormal reaction of the patient, or of later complication, without mention of misadventure at the time of the procedure: Secondary | ICD-10-CM | POA: Insufficient documentation

## 2011-11-09 MED ORDER — IOHEXOL 300 MG/ML  SOLN
150.0000 mL | Freq: Once | INTRAMUSCULAR | Status: AC | PRN
  Administered 2011-11-09: 100 mL via INTRAVENOUS

## 2011-11-09 NOTE — H&P (Signed)
[name] is an 35 y.o. female.   Chief Complaint: ESRD; left arm graft stenosis HPI: scheduled for angioplasty/stent of left arm dialysis graft  No past medical history on file.  No past surgical history on file.  No family history on file. Social History:  does not have a smoking history on file. She does not have any smokeless tobacco history on file. Her alcohol and drug histories not on file.  Allergies: Allergies not on file  No current outpatient prescriptions on file as of 11/09/2011.   No current facility-administered medications on file as of 11/09/2011.    No results found for this or any previous visit (from the past 48 hour(s)). No results found.  ROS  There were no vitals taken for this visit. Physical Exam  Constitutional: She is oriented to person, place, and time. She appears well-developed and well-nourished.  HENT:  Head: Normocephalic.  Eyes: EOM are normal.  Neck: Normal range of motion.  Cardiovascular: Normal rate, regular rhythm and normal heart sounds.   No murmur heard. Respiratory: Effort normal and breath sounds normal. She has no wheezes.  GI: Soft. Bowel sounds are normal.  Musculoskeletal: Normal range of motion.  Neurological: She is alert and oriented to person, place, and time.  Skin: Skin is warm and dry.     Assessment/Plan Left arm graft dialysis graft stenosis Scheduled for PTA/stent  Pt aware of procedure benefits and risks and agreeable to proceed. Consent signed.  [name] A 11/09/2011, 2:34 PM

## 2011-11-09 NOTE — Procedures (Signed)
Successful 4mm PTA LUE AVG venous anastomosis No comp Stable

## 2011-11-14 ENCOUNTER — Ambulatory Visit (HOSPITAL_COMMUNITY)
Admission: RE | Admit: 2011-11-14 | Discharge: 2011-11-14 | Payer: Medicare Other | Source: Ambulatory Visit | Attending: Nephrology | Admitting: Nephrology

## 2011-11-16 ENCOUNTER — Ambulatory Visit (HOSPITAL_COMMUNITY): Admission: RE | Admit: 2011-11-16 | Payer: Medicare Other | Source: Ambulatory Visit

## 2011-11-22 DIAGNOSIS — N2581 Secondary hyperparathyroidism of renal origin: Secondary | ICD-10-CM | POA: Diagnosis not present

## 2011-11-22 DIAGNOSIS — D509 Iron deficiency anemia, unspecified: Secondary | ICD-10-CM | POA: Diagnosis not present

## 2011-11-22 DIAGNOSIS — D631 Anemia in chronic kidney disease: Secondary | ICD-10-CM | POA: Diagnosis not present

## 2011-11-22 DIAGNOSIS — N186 End stage renal disease: Secondary | ICD-10-CM | POA: Diagnosis not present

## 2011-12-20 DIAGNOSIS — N186 End stage renal disease: Secondary | ICD-10-CM | POA: Diagnosis not present

## 2011-12-22 DIAGNOSIS — D509 Iron deficiency anemia, unspecified: Secondary | ICD-10-CM | POA: Diagnosis not present

## 2011-12-22 DIAGNOSIS — N186 End stage renal disease: Secondary | ICD-10-CM | POA: Diagnosis not present

## 2011-12-22 DIAGNOSIS — D631 Anemia in chronic kidney disease: Secondary | ICD-10-CM | POA: Diagnosis not present

## 2011-12-22 DIAGNOSIS — N2581 Secondary hyperparathyroidism of renal origin: Secondary | ICD-10-CM | POA: Diagnosis not present

## 2011-12-22 DIAGNOSIS — Z23 Encounter for immunization: Secondary | ICD-10-CM | POA: Diagnosis not present

## 2012-01-17 DIAGNOSIS — N186 End stage renal disease: Secondary | ICD-10-CM | POA: Diagnosis not present

## 2012-01-19 DIAGNOSIS — N186 End stage renal disease: Secondary | ICD-10-CM | POA: Diagnosis not present

## 2012-01-19 DIAGNOSIS — Z992 Dependence on renal dialysis: Secondary | ICD-10-CM | POA: Diagnosis not present

## 2012-01-19 DIAGNOSIS — D509 Iron deficiency anemia, unspecified: Secondary | ICD-10-CM | POA: Diagnosis not present

## 2012-01-19 DIAGNOSIS — D631 Anemia in chronic kidney disease: Secondary | ICD-10-CM | POA: Diagnosis not present

## 2012-01-19 DIAGNOSIS — N2581 Secondary hyperparathyroidism of renal origin: Secondary | ICD-10-CM | POA: Diagnosis not present

## 2012-01-25 DIAGNOSIS — Z0000 Encounter for general adult medical examination without abnormal findings: Secondary | ICD-10-CM | POA: Diagnosis not present

## 2012-01-25 DIAGNOSIS — E119 Type 2 diabetes mellitus without complications: Secondary | ICD-10-CM | POA: Diagnosis not present

## 2012-02-17 DIAGNOSIS — N186 End stage renal disease: Secondary | ICD-10-CM | POA: Diagnosis not present

## 2012-02-19 DIAGNOSIS — N186 End stage renal disease: Secondary | ICD-10-CM | POA: Diagnosis not present

## 2012-02-19 DIAGNOSIS — Z992 Dependence on renal dialysis: Secondary | ICD-10-CM | POA: Diagnosis not present

## 2012-02-19 DIAGNOSIS — N2581 Secondary hyperparathyroidism of renal origin: Secondary | ICD-10-CM | POA: Diagnosis not present

## 2012-02-19 DIAGNOSIS — D631 Anemia in chronic kidney disease: Secondary | ICD-10-CM | POA: Diagnosis not present

## 2012-02-19 DIAGNOSIS — D509 Iron deficiency anemia, unspecified: Secondary | ICD-10-CM | POA: Diagnosis not present

## 2012-03-18 DIAGNOSIS — N186 End stage renal disease: Secondary | ICD-10-CM | POA: Diagnosis not present

## 2012-03-20 DIAGNOSIS — D631 Anemia in chronic kidney disease: Secondary | ICD-10-CM | POA: Diagnosis not present

## 2012-03-20 DIAGNOSIS — N2581 Secondary hyperparathyroidism of renal origin: Secondary | ICD-10-CM | POA: Diagnosis not present

## 2012-03-20 DIAGNOSIS — N186 End stage renal disease: Secondary | ICD-10-CM | POA: Diagnosis not present

## 2012-03-20 DIAGNOSIS — Z992 Dependence on renal dialysis: Secondary | ICD-10-CM | POA: Diagnosis not present

## 2012-03-20 DIAGNOSIS — E876 Hypokalemia: Secondary | ICD-10-CM | POA: Diagnosis not present

## 2012-03-20 DIAGNOSIS — D509 Iron deficiency anemia, unspecified: Secondary | ICD-10-CM | POA: Diagnosis not present

## 2012-03-20 DIAGNOSIS — N039 Chronic nephritic syndrome with unspecified morphologic changes: Secondary | ICD-10-CM | POA: Diagnosis not present

## 2012-03-22 DIAGNOSIS — E876 Hypokalemia: Secondary | ICD-10-CM | POA: Diagnosis not present

## 2012-03-22 DIAGNOSIS — N186 End stage renal disease: Secondary | ICD-10-CM | POA: Diagnosis not present

## 2012-03-22 DIAGNOSIS — D509 Iron deficiency anemia, unspecified: Secondary | ICD-10-CM | POA: Diagnosis not present

## 2012-03-22 DIAGNOSIS — N039 Chronic nephritic syndrome with unspecified morphologic changes: Secondary | ICD-10-CM | POA: Diagnosis not present

## 2012-03-22 DIAGNOSIS — N2581 Secondary hyperparathyroidism of renal origin: Secondary | ICD-10-CM | POA: Diagnosis not present

## 2012-03-22 DIAGNOSIS — Z992 Dependence on renal dialysis: Secondary | ICD-10-CM | POA: Diagnosis not present

## 2012-03-25 DIAGNOSIS — N186 End stage renal disease: Secondary | ICD-10-CM | POA: Diagnosis not present

## 2012-03-25 DIAGNOSIS — D631 Anemia in chronic kidney disease: Secondary | ICD-10-CM | POA: Diagnosis not present

## 2012-03-25 DIAGNOSIS — Z992 Dependence on renal dialysis: Secondary | ICD-10-CM | POA: Diagnosis not present

## 2012-03-25 DIAGNOSIS — E876 Hypokalemia: Secondary | ICD-10-CM | POA: Diagnosis not present

## 2012-03-25 DIAGNOSIS — N2581 Secondary hyperparathyroidism of renal origin: Secondary | ICD-10-CM | POA: Diagnosis not present

## 2012-03-25 DIAGNOSIS — D509 Iron deficiency anemia, unspecified: Secondary | ICD-10-CM | POA: Diagnosis not present

## 2012-03-27 DIAGNOSIS — E876 Hypokalemia: Secondary | ICD-10-CM | POA: Diagnosis not present

## 2012-03-27 DIAGNOSIS — N2581 Secondary hyperparathyroidism of renal origin: Secondary | ICD-10-CM | POA: Diagnosis not present

## 2012-03-27 DIAGNOSIS — D631 Anemia in chronic kidney disease: Secondary | ICD-10-CM | POA: Diagnosis not present

## 2012-03-27 DIAGNOSIS — Z992 Dependence on renal dialysis: Secondary | ICD-10-CM | POA: Diagnosis not present

## 2012-03-27 DIAGNOSIS — N186 End stage renal disease: Secondary | ICD-10-CM | POA: Diagnosis not present

## 2012-03-27 DIAGNOSIS — D509 Iron deficiency anemia, unspecified: Secondary | ICD-10-CM | POA: Diagnosis not present

## 2012-03-27 DIAGNOSIS — N039 Chronic nephritic syndrome with unspecified morphologic changes: Secondary | ICD-10-CM | POA: Diagnosis not present

## 2012-03-29 DIAGNOSIS — D509 Iron deficiency anemia, unspecified: Secondary | ICD-10-CM | POA: Diagnosis not present

## 2012-03-29 DIAGNOSIS — E876 Hypokalemia: Secondary | ICD-10-CM | POA: Diagnosis not present

## 2012-03-29 DIAGNOSIS — N039 Chronic nephritic syndrome with unspecified morphologic changes: Secondary | ICD-10-CM | POA: Diagnosis not present

## 2012-03-29 DIAGNOSIS — Z992 Dependence on renal dialysis: Secondary | ICD-10-CM | POA: Diagnosis not present

## 2012-03-29 DIAGNOSIS — N186 End stage renal disease: Secondary | ICD-10-CM | POA: Diagnosis not present

## 2012-03-29 DIAGNOSIS — N2581 Secondary hyperparathyroidism of renal origin: Secondary | ICD-10-CM | POA: Diagnosis not present

## 2012-04-01 DIAGNOSIS — D509 Iron deficiency anemia, unspecified: Secondary | ICD-10-CM | POA: Diagnosis not present

## 2012-04-01 DIAGNOSIS — E876 Hypokalemia: Secondary | ICD-10-CM | POA: Diagnosis not present

## 2012-04-01 DIAGNOSIS — Z992 Dependence on renal dialysis: Secondary | ICD-10-CM | POA: Diagnosis not present

## 2012-04-01 DIAGNOSIS — N039 Chronic nephritic syndrome with unspecified morphologic changes: Secondary | ICD-10-CM | POA: Diagnosis not present

## 2012-04-01 DIAGNOSIS — N186 End stage renal disease: Secondary | ICD-10-CM | POA: Diagnosis not present

## 2012-04-01 DIAGNOSIS — N2581 Secondary hyperparathyroidism of renal origin: Secondary | ICD-10-CM | POA: Diagnosis not present

## 2012-04-03 DIAGNOSIS — N039 Chronic nephritic syndrome with unspecified morphologic changes: Secondary | ICD-10-CM | POA: Diagnosis not present

## 2012-04-03 DIAGNOSIS — N2581 Secondary hyperparathyroidism of renal origin: Secondary | ICD-10-CM | POA: Diagnosis not present

## 2012-04-03 DIAGNOSIS — Z992 Dependence on renal dialysis: Secondary | ICD-10-CM | POA: Diagnosis not present

## 2012-04-03 DIAGNOSIS — N186 End stage renal disease: Secondary | ICD-10-CM | POA: Diagnosis not present

## 2012-04-03 DIAGNOSIS — D509 Iron deficiency anemia, unspecified: Secondary | ICD-10-CM | POA: Diagnosis not present

## 2012-04-03 DIAGNOSIS — E876 Hypokalemia: Secondary | ICD-10-CM | POA: Diagnosis not present

## 2012-04-05 DIAGNOSIS — D509 Iron deficiency anemia, unspecified: Secondary | ICD-10-CM | POA: Diagnosis not present

## 2012-04-05 DIAGNOSIS — Z992 Dependence on renal dialysis: Secondary | ICD-10-CM | POA: Diagnosis not present

## 2012-04-05 DIAGNOSIS — N186 End stage renal disease: Secondary | ICD-10-CM | POA: Diagnosis not present

## 2012-04-05 DIAGNOSIS — E876 Hypokalemia: Secondary | ICD-10-CM | POA: Diagnosis not present

## 2012-04-05 DIAGNOSIS — D631 Anemia in chronic kidney disease: Secondary | ICD-10-CM | POA: Diagnosis not present

## 2012-04-05 DIAGNOSIS — N2581 Secondary hyperparathyroidism of renal origin: Secondary | ICD-10-CM | POA: Diagnosis not present

## 2012-04-07 DIAGNOSIS — N2581 Secondary hyperparathyroidism of renal origin: Secondary | ICD-10-CM | POA: Diagnosis not present

## 2012-04-07 DIAGNOSIS — Z992 Dependence on renal dialysis: Secondary | ICD-10-CM | POA: Diagnosis not present

## 2012-04-07 DIAGNOSIS — N039 Chronic nephritic syndrome with unspecified morphologic changes: Secondary | ICD-10-CM | POA: Diagnosis not present

## 2012-04-07 DIAGNOSIS — N186 End stage renal disease: Secondary | ICD-10-CM | POA: Diagnosis not present

## 2012-04-07 DIAGNOSIS — E876 Hypokalemia: Secondary | ICD-10-CM | POA: Diagnosis not present

## 2012-04-07 DIAGNOSIS — D509 Iron deficiency anemia, unspecified: Secondary | ICD-10-CM | POA: Diagnosis not present

## 2012-04-09 DIAGNOSIS — Z992 Dependence on renal dialysis: Secondary | ICD-10-CM | POA: Diagnosis not present

## 2012-04-09 DIAGNOSIS — D631 Anemia in chronic kidney disease: Secondary | ICD-10-CM | POA: Diagnosis not present

## 2012-04-09 DIAGNOSIS — D509 Iron deficiency anemia, unspecified: Secondary | ICD-10-CM | POA: Diagnosis not present

## 2012-04-09 DIAGNOSIS — N039 Chronic nephritic syndrome with unspecified morphologic changes: Secondary | ICD-10-CM | POA: Diagnosis not present

## 2012-04-09 DIAGNOSIS — N186 End stage renal disease: Secondary | ICD-10-CM | POA: Diagnosis not present

## 2012-04-09 DIAGNOSIS — N2581 Secondary hyperparathyroidism of renal origin: Secondary | ICD-10-CM | POA: Diagnosis not present

## 2012-04-09 DIAGNOSIS — E876 Hypokalemia: Secondary | ICD-10-CM | POA: Diagnosis not present

## 2012-04-11 DIAGNOSIS — Z992 Dependence on renal dialysis: Secondary | ICD-10-CM | POA: Diagnosis not present

## 2012-04-11 DIAGNOSIS — E876 Hypokalemia: Secondary | ICD-10-CM | POA: Diagnosis not present

## 2012-04-11 DIAGNOSIS — D509 Iron deficiency anemia, unspecified: Secondary | ICD-10-CM | POA: Diagnosis not present

## 2012-04-11 DIAGNOSIS — D631 Anemia in chronic kidney disease: Secondary | ICD-10-CM | POA: Diagnosis not present

## 2012-04-11 DIAGNOSIS — N186 End stage renal disease: Secondary | ICD-10-CM | POA: Diagnosis not present

## 2012-04-11 DIAGNOSIS — N2581 Secondary hyperparathyroidism of renal origin: Secondary | ICD-10-CM | POA: Diagnosis not present

## 2012-04-14 DIAGNOSIS — D631 Anemia in chronic kidney disease: Secondary | ICD-10-CM | POA: Diagnosis not present

## 2012-04-14 DIAGNOSIS — N186 End stage renal disease: Secondary | ICD-10-CM | POA: Diagnosis not present

## 2012-04-14 DIAGNOSIS — Z992 Dependence on renal dialysis: Secondary | ICD-10-CM | POA: Diagnosis not present

## 2012-04-14 DIAGNOSIS — D509 Iron deficiency anemia, unspecified: Secondary | ICD-10-CM | POA: Diagnosis not present

## 2012-04-14 DIAGNOSIS — E876 Hypokalemia: Secondary | ICD-10-CM | POA: Diagnosis not present

## 2012-04-14 DIAGNOSIS — N2581 Secondary hyperparathyroidism of renal origin: Secondary | ICD-10-CM | POA: Diagnosis not present

## 2012-04-16 DIAGNOSIS — Z992 Dependence on renal dialysis: Secondary | ICD-10-CM | POA: Diagnosis not present

## 2012-04-16 DIAGNOSIS — E876 Hypokalemia: Secondary | ICD-10-CM | POA: Diagnosis not present

## 2012-04-16 DIAGNOSIS — N039 Chronic nephritic syndrome with unspecified morphologic changes: Secondary | ICD-10-CM | POA: Diagnosis not present

## 2012-04-16 DIAGNOSIS — N2581 Secondary hyperparathyroidism of renal origin: Secondary | ICD-10-CM | POA: Diagnosis not present

## 2012-04-16 DIAGNOSIS — N186 End stage renal disease: Secondary | ICD-10-CM | POA: Diagnosis not present

## 2012-04-16 DIAGNOSIS — D509 Iron deficiency anemia, unspecified: Secondary | ICD-10-CM | POA: Diagnosis not present

## 2012-04-18 DIAGNOSIS — E876 Hypokalemia: Secondary | ICD-10-CM | POA: Diagnosis not present

## 2012-04-18 DIAGNOSIS — Z992 Dependence on renal dialysis: Secondary | ICD-10-CM | POA: Diagnosis not present

## 2012-04-18 DIAGNOSIS — N186 End stage renal disease: Secondary | ICD-10-CM | POA: Diagnosis not present

## 2012-04-18 DIAGNOSIS — D631 Anemia in chronic kidney disease: Secondary | ICD-10-CM | POA: Diagnosis not present

## 2012-04-18 DIAGNOSIS — N2581 Secondary hyperparathyroidism of renal origin: Secondary | ICD-10-CM | POA: Diagnosis not present

## 2012-04-18 DIAGNOSIS — D509 Iron deficiency anemia, unspecified: Secondary | ICD-10-CM | POA: Diagnosis not present

## 2012-04-21 DIAGNOSIS — N2581 Secondary hyperparathyroidism of renal origin: Secondary | ICD-10-CM | POA: Diagnosis not present

## 2012-04-21 DIAGNOSIS — D631 Anemia in chronic kidney disease: Secondary | ICD-10-CM | POA: Diagnosis not present

## 2012-04-21 DIAGNOSIS — D509 Iron deficiency anemia, unspecified: Secondary | ICD-10-CM | POA: Diagnosis not present

## 2012-04-21 DIAGNOSIS — N186 End stage renal disease: Secondary | ICD-10-CM | POA: Diagnosis not present

## 2012-04-21 DIAGNOSIS — Z23 Encounter for immunization: Secondary | ICD-10-CM | POA: Diagnosis not present

## 2012-04-21 DIAGNOSIS — N039 Chronic nephritic syndrome with unspecified morphologic changes: Secondary | ICD-10-CM | POA: Diagnosis not present

## 2012-04-21 DIAGNOSIS — Z992 Dependence on renal dialysis: Secondary | ICD-10-CM | POA: Diagnosis not present

## 2012-04-21 DIAGNOSIS — E876 Hypokalemia: Secondary | ICD-10-CM | POA: Diagnosis not present

## 2012-04-23 DIAGNOSIS — N186 End stage renal disease: Secondary | ICD-10-CM | POA: Diagnosis not present

## 2012-04-23 DIAGNOSIS — Z992 Dependence on renal dialysis: Secondary | ICD-10-CM | POA: Diagnosis not present

## 2012-04-23 DIAGNOSIS — D631 Anemia in chronic kidney disease: Secondary | ICD-10-CM | POA: Diagnosis not present

## 2012-04-23 DIAGNOSIS — Z23 Encounter for immunization: Secondary | ICD-10-CM | POA: Diagnosis not present

## 2012-04-23 DIAGNOSIS — D509 Iron deficiency anemia, unspecified: Secondary | ICD-10-CM | POA: Diagnosis not present

## 2012-04-23 DIAGNOSIS — E876 Hypokalemia: Secondary | ICD-10-CM | POA: Diagnosis not present

## 2012-04-25 DIAGNOSIS — D631 Anemia in chronic kidney disease: Secondary | ICD-10-CM | POA: Diagnosis not present

## 2012-04-25 DIAGNOSIS — N186 End stage renal disease: Secondary | ICD-10-CM | POA: Diagnosis not present

## 2012-04-25 DIAGNOSIS — Z23 Encounter for immunization: Secondary | ICD-10-CM | POA: Diagnosis not present

## 2012-04-25 DIAGNOSIS — D509 Iron deficiency anemia, unspecified: Secondary | ICD-10-CM | POA: Diagnosis not present

## 2012-04-25 DIAGNOSIS — Z992 Dependence on renal dialysis: Secondary | ICD-10-CM | POA: Diagnosis not present

## 2012-04-25 DIAGNOSIS — E876 Hypokalemia: Secondary | ICD-10-CM | POA: Diagnosis not present

## 2012-04-28 DIAGNOSIS — N186 End stage renal disease: Secondary | ICD-10-CM | POA: Diagnosis not present

## 2012-04-28 DIAGNOSIS — D631 Anemia in chronic kidney disease: Secondary | ICD-10-CM | POA: Diagnosis not present

## 2012-04-28 DIAGNOSIS — Z23 Encounter for immunization: Secondary | ICD-10-CM | POA: Diagnosis not present

## 2012-04-28 DIAGNOSIS — E876 Hypokalemia: Secondary | ICD-10-CM | POA: Diagnosis not present

## 2012-04-28 DIAGNOSIS — D509 Iron deficiency anemia, unspecified: Secondary | ICD-10-CM | POA: Diagnosis not present

## 2012-04-28 DIAGNOSIS — N039 Chronic nephritic syndrome with unspecified morphologic changes: Secondary | ICD-10-CM | POA: Diagnosis not present

## 2012-04-28 DIAGNOSIS — Z992 Dependence on renal dialysis: Secondary | ICD-10-CM | POA: Diagnosis not present

## 2012-04-30 DIAGNOSIS — D631 Anemia in chronic kidney disease: Secondary | ICD-10-CM | POA: Diagnosis not present

## 2012-04-30 DIAGNOSIS — D509 Iron deficiency anemia, unspecified: Secondary | ICD-10-CM | POA: Diagnosis not present

## 2012-04-30 DIAGNOSIS — Z23 Encounter for immunization: Secondary | ICD-10-CM | POA: Diagnosis not present

## 2012-04-30 DIAGNOSIS — E876 Hypokalemia: Secondary | ICD-10-CM | POA: Diagnosis not present

## 2012-04-30 DIAGNOSIS — Z992 Dependence on renal dialysis: Secondary | ICD-10-CM | POA: Diagnosis not present

## 2012-04-30 DIAGNOSIS — N186 End stage renal disease: Secondary | ICD-10-CM | POA: Diagnosis not present

## 2012-05-02 DIAGNOSIS — Z992 Dependence on renal dialysis: Secondary | ICD-10-CM | POA: Diagnosis not present

## 2012-05-02 DIAGNOSIS — D631 Anemia in chronic kidney disease: Secondary | ICD-10-CM | POA: Diagnosis not present

## 2012-05-02 DIAGNOSIS — D509 Iron deficiency anemia, unspecified: Secondary | ICD-10-CM | POA: Diagnosis not present

## 2012-05-02 DIAGNOSIS — E876 Hypokalemia: Secondary | ICD-10-CM | POA: Diagnosis not present

## 2012-05-02 DIAGNOSIS — Z23 Encounter for immunization: Secondary | ICD-10-CM | POA: Diagnosis not present

## 2012-05-02 DIAGNOSIS — N186 End stage renal disease: Secondary | ICD-10-CM | POA: Diagnosis not present

## 2012-05-05 DIAGNOSIS — Z992 Dependence on renal dialysis: Secondary | ICD-10-CM | POA: Diagnosis not present

## 2012-05-05 DIAGNOSIS — Z23 Encounter for immunization: Secondary | ICD-10-CM | POA: Diagnosis not present

## 2012-05-05 DIAGNOSIS — N039 Chronic nephritic syndrome with unspecified morphologic changes: Secondary | ICD-10-CM | POA: Diagnosis not present

## 2012-05-05 DIAGNOSIS — N186 End stage renal disease: Secondary | ICD-10-CM | POA: Diagnosis not present

## 2012-05-05 DIAGNOSIS — E876 Hypokalemia: Secondary | ICD-10-CM | POA: Diagnosis not present

## 2012-05-05 DIAGNOSIS — D509 Iron deficiency anemia, unspecified: Secondary | ICD-10-CM | POA: Diagnosis not present

## 2012-05-07 DIAGNOSIS — E876 Hypokalemia: Secondary | ICD-10-CM | POA: Diagnosis not present

## 2012-05-07 DIAGNOSIS — D631 Anemia in chronic kidney disease: Secondary | ICD-10-CM | POA: Diagnosis not present

## 2012-05-07 DIAGNOSIS — N186 End stage renal disease: Secondary | ICD-10-CM | POA: Diagnosis not present

## 2012-05-07 DIAGNOSIS — D509 Iron deficiency anemia, unspecified: Secondary | ICD-10-CM | POA: Diagnosis not present

## 2012-05-07 DIAGNOSIS — Z23 Encounter for immunization: Secondary | ICD-10-CM | POA: Diagnosis not present

## 2012-05-07 DIAGNOSIS — Z992 Dependence on renal dialysis: Secondary | ICD-10-CM | POA: Diagnosis not present

## 2012-05-09 DIAGNOSIS — N186 End stage renal disease: Secondary | ICD-10-CM | POA: Diagnosis not present

## 2012-05-09 DIAGNOSIS — E876 Hypokalemia: Secondary | ICD-10-CM | POA: Diagnosis not present

## 2012-05-09 DIAGNOSIS — Z23 Encounter for immunization: Secondary | ICD-10-CM | POA: Diagnosis not present

## 2012-05-09 DIAGNOSIS — N039 Chronic nephritic syndrome with unspecified morphologic changes: Secondary | ICD-10-CM | POA: Diagnosis not present

## 2012-05-09 DIAGNOSIS — Z992 Dependence on renal dialysis: Secondary | ICD-10-CM | POA: Diagnosis not present

## 2012-05-09 DIAGNOSIS — D509 Iron deficiency anemia, unspecified: Secondary | ICD-10-CM | POA: Diagnosis not present

## 2012-05-12 DIAGNOSIS — Z992 Dependence on renal dialysis: Secondary | ICD-10-CM | POA: Diagnosis not present

## 2012-05-12 DIAGNOSIS — D631 Anemia in chronic kidney disease: Secondary | ICD-10-CM | POA: Diagnosis not present

## 2012-05-12 DIAGNOSIS — D509 Iron deficiency anemia, unspecified: Secondary | ICD-10-CM | POA: Diagnosis not present

## 2012-05-12 DIAGNOSIS — E876 Hypokalemia: Secondary | ICD-10-CM | POA: Diagnosis not present

## 2012-05-12 DIAGNOSIS — N186 End stage renal disease: Secondary | ICD-10-CM | POA: Diagnosis not present

## 2012-05-12 DIAGNOSIS — Z23 Encounter for immunization: Secondary | ICD-10-CM | POA: Diagnosis not present

## 2012-05-14 DIAGNOSIS — N186 End stage renal disease: Secondary | ICD-10-CM | POA: Diagnosis not present

## 2012-05-14 DIAGNOSIS — Z23 Encounter for immunization: Secondary | ICD-10-CM | POA: Diagnosis not present

## 2012-05-14 DIAGNOSIS — D509 Iron deficiency anemia, unspecified: Secondary | ICD-10-CM | POA: Diagnosis not present

## 2012-05-14 DIAGNOSIS — D631 Anemia in chronic kidney disease: Secondary | ICD-10-CM | POA: Diagnosis not present

## 2012-05-14 DIAGNOSIS — Z992 Dependence on renal dialysis: Secondary | ICD-10-CM | POA: Diagnosis not present

## 2012-05-14 DIAGNOSIS — E876 Hypokalemia: Secondary | ICD-10-CM | POA: Diagnosis not present

## 2012-05-16 DIAGNOSIS — E876 Hypokalemia: Secondary | ICD-10-CM | POA: Diagnosis not present

## 2012-05-16 DIAGNOSIS — Z992 Dependence on renal dialysis: Secondary | ICD-10-CM | POA: Diagnosis not present

## 2012-05-16 DIAGNOSIS — D509 Iron deficiency anemia, unspecified: Secondary | ICD-10-CM | POA: Diagnosis not present

## 2012-05-16 DIAGNOSIS — N186 End stage renal disease: Secondary | ICD-10-CM | POA: Diagnosis not present

## 2012-05-16 DIAGNOSIS — D631 Anemia in chronic kidney disease: Secondary | ICD-10-CM | POA: Diagnosis not present

## 2012-05-16 DIAGNOSIS — Z23 Encounter for immunization: Secondary | ICD-10-CM | POA: Diagnosis not present

## 2012-05-18 DIAGNOSIS — N186 End stage renal disease: Secondary | ICD-10-CM | POA: Diagnosis not present

## 2012-05-19 DIAGNOSIS — Z23 Encounter for immunization: Secondary | ICD-10-CM | POA: Diagnosis not present

## 2012-05-19 DIAGNOSIS — Z992 Dependence on renal dialysis: Secondary | ICD-10-CM | POA: Diagnosis not present

## 2012-05-19 DIAGNOSIS — D631 Anemia in chronic kidney disease: Secondary | ICD-10-CM | POA: Diagnosis not present

## 2012-05-19 DIAGNOSIS — E876 Hypokalemia: Secondary | ICD-10-CM | POA: Diagnosis not present

## 2012-05-19 DIAGNOSIS — N2581 Secondary hyperparathyroidism of renal origin: Secondary | ICD-10-CM | POA: Diagnosis not present

## 2012-05-19 DIAGNOSIS — N186 End stage renal disease: Secondary | ICD-10-CM | POA: Diagnosis not present

## 2012-05-19 DIAGNOSIS — D509 Iron deficiency anemia, unspecified: Secondary | ICD-10-CM | POA: Diagnosis not present

## 2012-06-18 DIAGNOSIS — N186 End stage renal disease: Secondary | ICD-10-CM | POA: Diagnosis not present

## 2012-06-20 DIAGNOSIS — E876 Hypokalemia: Secondary | ICD-10-CM | POA: Diagnosis not present

## 2012-06-20 DIAGNOSIS — D509 Iron deficiency anemia, unspecified: Secondary | ICD-10-CM | POA: Diagnosis not present

## 2012-06-20 DIAGNOSIS — Z992 Dependence on renal dialysis: Secondary | ICD-10-CM | POA: Diagnosis not present

## 2012-06-20 DIAGNOSIS — N186 End stage renal disease: Secondary | ICD-10-CM | POA: Diagnosis not present

## 2012-06-20 DIAGNOSIS — D631 Anemia in chronic kidney disease: Secondary | ICD-10-CM | POA: Diagnosis not present

## 2012-06-20 DIAGNOSIS — Z23 Encounter for immunization: Secondary | ICD-10-CM | POA: Diagnosis not present

## 2012-06-20 DIAGNOSIS — N2581 Secondary hyperparathyroidism of renal origin: Secondary | ICD-10-CM | POA: Diagnosis not present

## 2012-07-19 DIAGNOSIS — N186 End stage renal disease: Secondary | ICD-10-CM | POA: Diagnosis not present

## 2012-07-21 DIAGNOSIS — N2581 Secondary hyperparathyroidism of renal origin: Secondary | ICD-10-CM | POA: Diagnosis not present

## 2012-07-21 DIAGNOSIS — E876 Hypokalemia: Secondary | ICD-10-CM | POA: Diagnosis not present

## 2012-07-21 DIAGNOSIS — N186 End stage renal disease: Secondary | ICD-10-CM | POA: Diagnosis not present

## 2012-07-21 DIAGNOSIS — Z23 Encounter for immunization: Secondary | ICD-10-CM | POA: Diagnosis not present

## 2012-07-21 DIAGNOSIS — D509 Iron deficiency anemia, unspecified: Secondary | ICD-10-CM | POA: Diagnosis not present

## 2012-07-21 DIAGNOSIS — D631 Anemia in chronic kidney disease: Secondary | ICD-10-CM | POA: Diagnosis not present

## 2012-07-21 DIAGNOSIS — N039 Chronic nephritic syndrome with unspecified morphologic changes: Secondary | ICD-10-CM | POA: Diagnosis not present

## 2012-07-21 DIAGNOSIS — Z992 Dependence on renal dialysis: Secondary | ICD-10-CM | POA: Diagnosis not present

## 2012-07-23 DIAGNOSIS — N2581 Secondary hyperparathyroidism of renal origin: Secondary | ICD-10-CM | POA: Diagnosis not present

## 2012-07-23 DIAGNOSIS — N186 End stage renal disease: Secondary | ICD-10-CM | POA: Diagnosis not present

## 2012-07-23 DIAGNOSIS — D509 Iron deficiency anemia, unspecified: Secondary | ICD-10-CM | POA: Diagnosis not present

## 2012-07-23 DIAGNOSIS — E876 Hypokalemia: Secondary | ICD-10-CM | POA: Diagnosis not present

## 2012-07-23 DIAGNOSIS — N039 Chronic nephritic syndrome with unspecified morphologic changes: Secondary | ICD-10-CM | POA: Diagnosis not present

## 2012-07-23 DIAGNOSIS — Z992 Dependence on renal dialysis: Secondary | ICD-10-CM | POA: Diagnosis not present

## 2012-07-23 DIAGNOSIS — D631 Anemia in chronic kidney disease: Secondary | ICD-10-CM | POA: Diagnosis not present

## 2012-07-25 DIAGNOSIS — N2581 Secondary hyperparathyroidism of renal origin: Secondary | ICD-10-CM | POA: Diagnosis not present

## 2012-07-25 DIAGNOSIS — D631 Anemia in chronic kidney disease: Secondary | ICD-10-CM | POA: Diagnosis not present

## 2012-07-25 DIAGNOSIS — N186 End stage renal disease: Secondary | ICD-10-CM | POA: Diagnosis not present

## 2012-07-28 DIAGNOSIS — N039 Chronic nephritic syndrome with unspecified morphologic changes: Secondary | ICD-10-CM | POA: Diagnosis not present

## 2012-07-28 DIAGNOSIS — N186 End stage renal disease: Secondary | ICD-10-CM | POA: Diagnosis not present

## 2012-07-28 DIAGNOSIS — N2581 Secondary hyperparathyroidism of renal origin: Secondary | ICD-10-CM | POA: Diagnosis not present

## 2012-07-30 DIAGNOSIS — N2581 Secondary hyperparathyroidism of renal origin: Secondary | ICD-10-CM | POA: Diagnosis not present

## 2012-07-30 DIAGNOSIS — D631 Anemia in chronic kidney disease: Secondary | ICD-10-CM | POA: Diagnosis not present

## 2012-07-30 DIAGNOSIS — N186 End stage renal disease: Secondary | ICD-10-CM | POA: Diagnosis not present

## 2012-07-30 DIAGNOSIS — N039 Chronic nephritic syndrome with unspecified morphologic changes: Secondary | ICD-10-CM | POA: Diagnosis not present

## 2012-08-01 DIAGNOSIS — E876 Hypokalemia: Secondary | ICD-10-CM | POA: Diagnosis not present

## 2012-08-01 DIAGNOSIS — N2581 Secondary hyperparathyroidism of renal origin: Secondary | ICD-10-CM | POA: Diagnosis not present

## 2012-08-01 DIAGNOSIS — N186 End stage renal disease: Secondary | ICD-10-CM | POA: Diagnosis not present

## 2012-08-01 DIAGNOSIS — N039 Chronic nephritic syndrome with unspecified morphologic changes: Secondary | ICD-10-CM | POA: Diagnosis not present

## 2012-08-01 DIAGNOSIS — Z992 Dependence on renal dialysis: Secondary | ICD-10-CM | POA: Diagnosis not present

## 2012-08-01 DIAGNOSIS — D509 Iron deficiency anemia, unspecified: Secondary | ICD-10-CM | POA: Diagnosis not present

## 2012-08-04 DIAGNOSIS — N186 End stage renal disease: Secondary | ICD-10-CM | POA: Diagnosis not present

## 2012-08-04 DIAGNOSIS — D509 Iron deficiency anemia, unspecified: Secondary | ICD-10-CM | POA: Diagnosis not present

## 2012-08-04 DIAGNOSIS — Z992 Dependence on renal dialysis: Secondary | ICD-10-CM | POA: Diagnosis not present

## 2012-08-04 DIAGNOSIS — N039 Chronic nephritic syndrome with unspecified morphologic changes: Secondary | ICD-10-CM | POA: Diagnosis not present

## 2012-08-04 DIAGNOSIS — E876 Hypokalemia: Secondary | ICD-10-CM | POA: Diagnosis not present

## 2012-08-04 DIAGNOSIS — N2581 Secondary hyperparathyroidism of renal origin: Secondary | ICD-10-CM | POA: Diagnosis not present

## 2012-08-06 DIAGNOSIS — N2581 Secondary hyperparathyroidism of renal origin: Secondary | ICD-10-CM | POA: Diagnosis not present

## 2012-08-06 DIAGNOSIS — E876 Hypokalemia: Secondary | ICD-10-CM | POA: Diagnosis not present

## 2012-08-06 DIAGNOSIS — D509 Iron deficiency anemia, unspecified: Secondary | ICD-10-CM | POA: Diagnosis not present

## 2012-08-06 DIAGNOSIS — Z992 Dependence on renal dialysis: Secondary | ICD-10-CM | POA: Diagnosis not present

## 2012-08-06 DIAGNOSIS — N039 Chronic nephritic syndrome with unspecified morphologic changes: Secondary | ICD-10-CM | POA: Diagnosis not present

## 2012-08-06 DIAGNOSIS — N186 End stage renal disease: Secondary | ICD-10-CM | POA: Diagnosis not present

## 2012-08-08 DIAGNOSIS — E876 Hypokalemia: Secondary | ICD-10-CM | POA: Diagnosis not present

## 2012-08-08 DIAGNOSIS — D631 Anemia in chronic kidney disease: Secondary | ICD-10-CM | POA: Diagnosis not present

## 2012-08-08 DIAGNOSIS — Z992 Dependence on renal dialysis: Secondary | ICD-10-CM | POA: Diagnosis not present

## 2012-08-08 DIAGNOSIS — N2581 Secondary hyperparathyroidism of renal origin: Secondary | ICD-10-CM | POA: Diagnosis not present

## 2012-08-08 DIAGNOSIS — D509 Iron deficiency anemia, unspecified: Secondary | ICD-10-CM | POA: Diagnosis not present

## 2012-08-08 DIAGNOSIS — N186 End stage renal disease: Secondary | ICD-10-CM | POA: Diagnosis not present

## 2012-08-11 DIAGNOSIS — N2581 Secondary hyperparathyroidism of renal origin: Secondary | ICD-10-CM | POA: Diagnosis not present

## 2012-08-11 DIAGNOSIS — D509 Iron deficiency anemia, unspecified: Secondary | ICD-10-CM | POA: Diagnosis not present

## 2012-08-11 DIAGNOSIS — Z992 Dependence on renal dialysis: Secondary | ICD-10-CM | POA: Diagnosis not present

## 2012-08-11 DIAGNOSIS — N186 End stage renal disease: Secondary | ICD-10-CM | POA: Diagnosis not present

## 2012-08-11 DIAGNOSIS — D631 Anemia in chronic kidney disease: Secondary | ICD-10-CM | POA: Diagnosis not present

## 2012-08-11 DIAGNOSIS — E876 Hypokalemia: Secondary | ICD-10-CM | POA: Diagnosis not present

## 2012-08-13 DIAGNOSIS — E876 Hypokalemia: Secondary | ICD-10-CM | POA: Diagnosis not present

## 2012-08-13 DIAGNOSIS — N2581 Secondary hyperparathyroidism of renal origin: Secondary | ICD-10-CM | POA: Diagnosis not present

## 2012-08-13 DIAGNOSIS — Z992 Dependence on renal dialysis: Secondary | ICD-10-CM | POA: Diagnosis not present

## 2012-08-13 DIAGNOSIS — D509 Iron deficiency anemia, unspecified: Secondary | ICD-10-CM | POA: Diagnosis not present

## 2012-08-13 DIAGNOSIS — N039 Chronic nephritic syndrome with unspecified morphologic changes: Secondary | ICD-10-CM | POA: Diagnosis not present

## 2012-08-13 DIAGNOSIS — N186 End stage renal disease: Secondary | ICD-10-CM | POA: Diagnosis not present

## 2012-08-15 DIAGNOSIS — E876 Hypokalemia: Secondary | ICD-10-CM | POA: Diagnosis not present

## 2012-08-15 DIAGNOSIS — N186 End stage renal disease: Secondary | ICD-10-CM | POA: Diagnosis not present

## 2012-08-15 DIAGNOSIS — Z992 Dependence on renal dialysis: Secondary | ICD-10-CM | POA: Diagnosis not present

## 2012-08-15 DIAGNOSIS — N2581 Secondary hyperparathyroidism of renal origin: Secondary | ICD-10-CM | POA: Diagnosis not present

## 2012-08-15 DIAGNOSIS — D509 Iron deficiency anemia, unspecified: Secondary | ICD-10-CM | POA: Diagnosis not present

## 2012-08-15 DIAGNOSIS — D631 Anemia in chronic kidney disease: Secondary | ICD-10-CM | POA: Diagnosis not present

## 2012-08-18 DIAGNOSIS — Z992 Dependence on renal dialysis: Secondary | ICD-10-CM | POA: Diagnosis not present

## 2012-08-18 DIAGNOSIS — N186 End stage renal disease: Secondary | ICD-10-CM | POA: Diagnosis not present

## 2012-08-18 DIAGNOSIS — N2581 Secondary hyperparathyroidism of renal origin: Secondary | ICD-10-CM | POA: Diagnosis not present

## 2012-08-18 DIAGNOSIS — N039 Chronic nephritic syndrome with unspecified morphologic changes: Secondary | ICD-10-CM | POA: Diagnosis not present

## 2012-08-18 DIAGNOSIS — E876 Hypokalemia: Secondary | ICD-10-CM | POA: Diagnosis not present

## 2012-08-18 DIAGNOSIS — D509 Iron deficiency anemia, unspecified: Secondary | ICD-10-CM | POA: Diagnosis not present

## 2012-08-20 DIAGNOSIS — D631 Anemia in chronic kidney disease: Secondary | ICD-10-CM | POA: Diagnosis not present

## 2012-08-20 DIAGNOSIS — N2581 Secondary hyperparathyroidism of renal origin: Secondary | ICD-10-CM | POA: Diagnosis not present

## 2012-08-20 DIAGNOSIS — D509 Iron deficiency anemia, unspecified: Secondary | ICD-10-CM | POA: Diagnosis not present

## 2012-08-20 DIAGNOSIS — N186 End stage renal disease: Secondary | ICD-10-CM | POA: Diagnosis not present

## 2012-08-20 DIAGNOSIS — Z992 Dependence on renal dialysis: Secondary | ICD-10-CM | POA: Diagnosis not present

## 2012-08-20 DIAGNOSIS — E876 Hypokalemia: Secondary | ICD-10-CM | POA: Diagnosis not present

## 2012-09-18 DIAGNOSIS — N186 End stage renal disease: Secondary | ICD-10-CM | POA: Diagnosis not present

## 2012-09-19 DIAGNOSIS — N2581 Secondary hyperparathyroidism of renal origin: Secondary | ICD-10-CM | POA: Diagnosis not present

## 2012-09-19 DIAGNOSIS — N186 End stage renal disease: Secondary | ICD-10-CM | POA: Diagnosis not present

## 2012-09-19 DIAGNOSIS — E876 Hypokalemia: Secondary | ICD-10-CM | POA: Diagnosis not present

## 2012-09-19 DIAGNOSIS — D509 Iron deficiency anemia, unspecified: Secondary | ICD-10-CM | POA: Diagnosis not present

## 2012-09-19 DIAGNOSIS — D631 Anemia in chronic kidney disease: Secondary | ICD-10-CM | POA: Diagnosis not present

## 2012-09-22 DIAGNOSIS — N186 End stage renal disease: Secondary | ICD-10-CM | POA: Diagnosis not present

## 2012-09-22 DIAGNOSIS — N2581 Secondary hyperparathyroidism of renal origin: Secondary | ICD-10-CM | POA: Diagnosis not present

## 2012-09-22 DIAGNOSIS — N039 Chronic nephritic syndrome with unspecified morphologic changes: Secondary | ICD-10-CM | POA: Diagnosis not present

## 2012-09-22 DIAGNOSIS — D509 Iron deficiency anemia, unspecified: Secondary | ICD-10-CM | POA: Diagnosis not present

## 2012-09-22 DIAGNOSIS — E876 Hypokalemia: Secondary | ICD-10-CM | POA: Diagnosis not present

## 2012-09-24 DIAGNOSIS — E876 Hypokalemia: Secondary | ICD-10-CM | POA: Diagnosis not present

## 2012-09-24 DIAGNOSIS — D509 Iron deficiency anemia, unspecified: Secondary | ICD-10-CM | POA: Diagnosis not present

## 2012-09-24 DIAGNOSIS — N186 End stage renal disease: Secondary | ICD-10-CM | POA: Diagnosis not present

## 2012-09-24 DIAGNOSIS — N039 Chronic nephritic syndrome with unspecified morphologic changes: Secondary | ICD-10-CM | POA: Diagnosis not present

## 2012-09-24 DIAGNOSIS — N2581 Secondary hyperparathyroidism of renal origin: Secondary | ICD-10-CM | POA: Diagnosis not present

## 2012-09-26 DIAGNOSIS — D509 Iron deficiency anemia, unspecified: Secondary | ICD-10-CM | POA: Diagnosis not present

## 2012-09-26 DIAGNOSIS — N2581 Secondary hyperparathyroidism of renal origin: Secondary | ICD-10-CM | POA: Diagnosis not present

## 2012-09-26 DIAGNOSIS — N186 End stage renal disease: Secondary | ICD-10-CM | POA: Diagnosis not present

## 2012-09-26 DIAGNOSIS — D631 Anemia in chronic kidney disease: Secondary | ICD-10-CM | POA: Diagnosis not present

## 2012-09-26 DIAGNOSIS — E876 Hypokalemia: Secondary | ICD-10-CM | POA: Diagnosis not present

## 2012-09-29 DIAGNOSIS — D509 Iron deficiency anemia, unspecified: Secondary | ICD-10-CM | POA: Diagnosis not present

## 2012-09-29 DIAGNOSIS — N2581 Secondary hyperparathyroidism of renal origin: Secondary | ICD-10-CM | POA: Diagnosis not present

## 2012-09-29 DIAGNOSIS — N186 End stage renal disease: Secondary | ICD-10-CM | POA: Diagnosis not present

## 2012-09-29 DIAGNOSIS — E876 Hypokalemia: Secondary | ICD-10-CM | POA: Diagnosis not present

## 2012-09-29 DIAGNOSIS — D631 Anemia in chronic kidney disease: Secondary | ICD-10-CM | POA: Diagnosis not present

## 2012-10-01 DIAGNOSIS — N2581 Secondary hyperparathyroidism of renal origin: Secondary | ICD-10-CM | POA: Diagnosis not present

## 2012-10-01 DIAGNOSIS — N186 End stage renal disease: Secondary | ICD-10-CM | POA: Diagnosis not present

## 2012-10-01 DIAGNOSIS — E876 Hypokalemia: Secondary | ICD-10-CM | POA: Diagnosis not present

## 2012-10-01 DIAGNOSIS — D509 Iron deficiency anemia, unspecified: Secondary | ICD-10-CM | POA: Diagnosis not present

## 2012-10-01 DIAGNOSIS — D631 Anemia in chronic kidney disease: Secondary | ICD-10-CM | POA: Diagnosis not present

## 2012-10-03 DIAGNOSIS — D509 Iron deficiency anemia, unspecified: Secondary | ICD-10-CM | POA: Diagnosis not present

## 2012-10-03 DIAGNOSIS — D631 Anemia in chronic kidney disease: Secondary | ICD-10-CM | POA: Diagnosis not present

## 2012-10-03 DIAGNOSIS — N186 End stage renal disease: Secondary | ICD-10-CM | POA: Diagnosis not present

## 2012-10-03 DIAGNOSIS — E876 Hypokalemia: Secondary | ICD-10-CM | POA: Diagnosis not present

## 2012-10-03 DIAGNOSIS — N2581 Secondary hyperparathyroidism of renal origin: Secondary | ICD-10-CM | POA: Diagnosis not present

## 2012-10-06 DIAGNOSIS — N2581 Secondary hyperparathyroidism of renal origin: Secondary | ICD-10-CM | POA: Diagnosis not present

## 2012-10-06 DIAGNOSIS — D631 Anemia in chronic kidney disease: Secondary | ICD-10-CM | POA: Diagnosis not present

## 2012-10-06 DIAGNOSIS — E876 Hypokalemia: Secondary | ICD-10-CM | POA: Diagnosis not present

## 2012-10-06 DIAGNOSIS — D509 Iron deficiency anemia, unspecified: Secondary | ICD-10-CM | POA: Diagnosis not present

## 2012-10-06 DIAGNOSIS — N186 End stage renal disease: Secondary | ICD-10-CM | POA: Diagnosis not present

## 2012-10-08 DIAGNOSIS — N039 Chronic nephritic syndrome with unspecified morphologic changes: Secondary | ICD-10-CM | POA: Diagnosis not present

## 2012-10-08 DIAGNOSIS — E876 Hypokalemia: Secondary | ICD-10-CM | POA: Diagnosis not present

## 2012-10-08 DIAGNOSIS — N186 End stage renal disease: Secondary | ICD-10-CM | POA: Diagnosis not present

## 2012-10-08 DIAGNOSIS — D509 Iron deficiency anemia, unspecified: Secondary | ICD-10-CM | POA: Diagnosis not present

## 2012-10-08 DIAGNOSIS — N2581 Secondary hyperparathyroidism of renal origin: Secondary | ICD-10-CM | POA: Diagnosis not present

## 2012-10-10 DIAGNOSIS — N039 Chronic nephritic syndrome with unspecified morphologic changes: Secondary | ICD-10-CM | POA: Diagnosis not present

## 2012-10-10 DIAGNOSIS — D509 Iron deficiency anemia, unspecified: Secondary | ICD-10-CM | POA: Diagnosis not present

## 2012-10-10 DIAGNOSIS — N2581 Secondary hyperparathyroidism of renal origin: Secondary | ICD-10-CM | POA: Diagnosis not present

## 2012-10-10 DIAGNOSIS — N186 End stage renal disease: Secondary | ICD-10-CM | POA: Diagnosis not present

## 2012-10-10 DIAGNOSIS — E876 Hypokalemia: Secondary | ICD-10-CM | POA: Diagnosis not present

## 2012-10-13 DIAGNOSIS — N186 End stage renal disease: Secondary | ICD-10-CM | POA: Diagnosis not present

## 2012-10-13 DIAGNOSIS — D509 Iron deficiency anemia, unspecified: Secondary | ICD-10-CM | POA: Diagnosis not present

## 2012-10-13 DIAGNOSIS — N039 Chronic nephritic syndrome with unspecified morphologic changes: Secondary | ICD-10-CM | POA: Diagnosis not present

## 2012-10-13 DIAGNOSIS — E876 Hypokalemia: Secondary | ICD-10-CM | POA: Diagnosis not present

## 2012-10-13 DIAGNOSIS — N2581 Secondary hyperparathyroidism of renal origin: Secondary | ICD-10-CM | POA: Diagnosis not present

## 2012-10-15 DIAGNOSIS — N2581 Secondary hyperparathyroidism of renal origin: Secondary | ICD-10-CM | POA: Diagnosis not present

## 2012-10-15 DIAGNOSIS — E876 Hypokalemia: Secondary | ICD-10-CM | POA: Diagnosis not present

## 2012-10-15 DIAGNOSIS — N186 End stage renal disease: Secondary | ICD-10-CM | POA: Diagnosis not present

## 2012-10-15 DIAGNOSIS — D631 Anemia in chronic kidney disease: Secondary | ICD-10-CM | POA: Diagnosis not present

## 2012-10-15 DIAGNOSIS — D509 Iron deficiency anemia, unspecified: Secondary | ICD-10-CM | POA: Diagnosis not present

## 2012-10-17 DIAGNOSIS — D509 Iron deficiency anemia, unspecified: Secondary | ICD-10-CM | POA: Diagnosis not present

## 2012-10-17 DIAGNOSIS — D631 Anemia in chronic kidney disease: Secondary | ICD-10-CM | POA: Diagnosis not present

## 2012-10-17 DIAGNOSIS — E876 Hypokalemia: Secondary | ICD-10-CM | POA: Diagnosis not present

## 2012-10-17 DIAGNOSIS — N186 End stage renal disease: Secondary | ICD-10-CM | POA: Diagnosis not present

## 2012-10-17 DIAGNOSIS — N2581 Secondary hyperparathyroidism of renal origin: Secondary | ICD-10-CM | POA: Diagnosis not present

## 2012-10-18 DIAGNOSIS — N186 End stage renal disease: Secondary | ICD-10-CM | POA: Diagnosis not present

## 2012-10-20 DIAGNOSIS — E876 Hypokalemia: Secondary | ICD-10-CM | POA: Diagnosis not present

## 2012-10-20 DIAGNOSIS — N186 End stage renal disease: Secondary | ICD-10-CM | POA: Diagnosis not present

## 2012-10-20 DIAGNOSIS — D631 Anemia in chronic kidney disease: Secondary | ICD-10-CM | POA: Diagnosis not present

## 2012-10-20 DIAGNOSIS — N2581 Secondary hyperparathyroidism of renal origin: Secondary | ICD-10-CM | POA: Diagnosis not present

## 2012-11-18 DIAGNOSIS — N186 End stage renal disease: Secondary | ICD-10-CM | POA: Diagnosis not present

## 2012-11-21 DIAGNOSIS — E876 Hypokalemia: Secondary | ICD-10-CM | POA: Diagnosis not present

## 2012-11-21 DIAGNOSIS — N2581 Secondary hyperparathyroidism of renal origin: Secondary | ICD-10-CM | POA: Diagnosis not present

## 2012-11-21 DIAGNOSIS — N186 End stage renal disease: Secondary | ICD-10-CM | POA: Diagnosis not present

## 2012-11-21 DIAGNOSIS — D631 Anemia in chronic kidney disease: Secondary | ICD-10-CM | POA: Diagnosis not present

## 2012-11-24 DIAGNOSIS — N039 Chronic nephritic syndrome with unspecified morphologic changes: Secondary | ICD-10-CM | POA: Diagnosis not present

## 2012-11-24 DIAGNOSIS — N186 End stage renal disease: Secondary | ICD-10-CM | POA: Diagnosis not present

## 2012-11-24 DIAGNOSIS — N2581 Secondary hyperparathyroidism of renal origin: Secondary | ICD-10-CM | POA: Diagnosis not present

## 2012-11-24 DIAGNOSIS — E876 Hypokalemia: Secondary | ICD-10-CM | POA: Diagnosis not present

## 2012-11-26 DIAGNOSIS — D631 Anemia in chronic kidney disease: Secondary | ICD-10-CM | POA: Diagnosis not present

## 2012-11-26 DIAGNOSIS — N2581 Secondary hyperparathyroidism of renal origin: Secondary | ICD-10-CM | POA: Diagnosis not present

## 2012-11-26 DIAGNOSIS — N186 End stage renal disease: Secondary | ICD-10-CM | POA: Diagnosis not present

## 2012-11-26 DIAGNOSIS — E876 Hypokalemia: Secondary | ICD-10-CM | POA: Diagnosis not present

## 2012-11-28 DIAGNOSIS — E876 Hypokalemia: Secondary | ICD-10-CM | POA: Diagnosis not present

## 2012-11-28 DIAGNOSIS — N186 End stage renal disease: Secondary | ICD-10-CM | POA: Diagnosis not present

## 2012-11-28 DIAGNOSIS — N2581 Secondary hyperparathyroidism of renal origin: Secondary | ICD-10-CM | POA: Diagnosis not present

## 2012-11-28 DIAGNOSIS — D631 Anemia in chronic kidney disease: Secondary | ICD-10-CM | POA: Diagnosis not present

## 2012-12-01 DIAGNOSIS — D631 Anemia in chronic kidney disease: Secondary | ICD-10-CM | POA: Diagnosis not present

## 2012-12-01 DIAGNOSIS — E876 Hypokalemia: Secondary | ICD-10-CM | POA: Diagnosis not present

## 2012-12-01 DIAGNOSIS — N186 End stage renal disease: Secondary | ICD-10-CM | POA: Diagnosis not present

## 2012-12-01 DIAGNOSIS — N2581 Secondary hyperparathyroidism of renal origin: Secondary | ICD-10-CM | POA: Diagnosis not present

## 2012-12-03 DIAGNOSIS — D631 Anemia in chronic kidney disease: Secondary | ICD-10-CM | POA: Diagnosis not present

## 2012-12-03 DIAGNOSIS — N186 End stage renal disease: Secondary | ICD-10-CM | POA: Diagnosis not present

## 2012-12-03 DIAGNOSIS — N2581 Secondary hyperparathyroidism of renal origin: Secondary | ICD-10-CM | POA: Diagnosis not present

## 2012-12-03 DIAGNOSIS — E876 Hypokalemia: Secondary | ICD-10-CM | POA: Diagnosis not present

## 2012-12-05 DIAGNOSIS — E876 Hypokalemia: Secondary | ICD-10-CM | POA: Diagnosis not present

## 2012-12-05 DIAGNOSIS — N2581 Secondary hyperparathyroidism of renal origin: Secondary | ICD-10-CM | POA: Diagnosis not present

## 2012-12-05 DIAGNOSIS — N186 End stage renal disease: Secondary | ICD-10-CM | POA: Diagnosis not present

## 2012-12-05 DIAGNOSIS — N039 Chronic nephritic syndrome with unspecified morphologic changes: Secondary | ICD-10-CM | POA: Diagnosis not present

## 2012-12-08 DIAGNOSIS — E876 Hypokalemia: Secondary | ICD-10-CM | POA: Diagnosis not present

## 2012-12-08 DIAGNOSIS — N2581 Secondary hyperparathyroidism of renal origin: Secondary | ICD-10-CM | POA: Diagnosis not present

## 2012-12-08 DIAGNOSIS — N186 End stage renal disease: Secondary | ICD-10-CM | POA: Diagnosis not present

## 2012-12-08 DIAGNOSIS — N039 Chronic nephritic syndrome with unspecified morphologic changes: Secondary | ICD-10-CM | POA: Diagnosis not present

## 2012-12-10 DIAGNOSIS — N186 End stage renal disease: Secondary | ICD-10-CM | POA: Diagnosis not present

## 2012-12-10 DIAGNOSIS — N039 Chronic nephritic syndrome with unspecified morphologic changes: Secondary | ICD-10-CM | POA: Diagnosis not present

## 2012-12-10 DIAGNOSIS — N2581 Secondary hyperparathyroidism of renal origin: Secondary | ICD-10-CM | POA: Diagnosis not present

## 2012-12-10 DIAGNOSIS — E876 Hypokalemia: Secondary | ICD-10-CM | POA: Diagnosis not present

## 2012-12-12 DIAGNOSIS — N186 End stage renal disease: Secondary | ICD-10-CM | POA: Diagnosis not present

## 2012-12-12 DIAGNOSIS — N2581 Secondary hyperparathyroidism of renal origin: Secondary | ICD-10-CM | POA: Diagnosis not present

## 2012-12-12 DIAGNOSIS — N039 Chronic nephritic syndrome with unspecified morphologic changes: Secondary | ICD-10-CM | POA: Diagnosis not present

## 2012-12-12 DIAGNOSIS — E876 Hypokalemia: Secondary | ICD-10-CM | POA: Diagnosis not present

## 2012-12-15 DIAGNOSIS — N2581 Secondary hyperparathyroidism of renal origin: Secondary | ICD-10-CM | POA: Diagnosis not present

## 2012-12-15 DIAGNOSIS — N039 Chronic nephritic syndrome with unspecified morphologic changes: Secondary | ICD-10-CM | POA: Diagnosis not present

## 2012-12-15 DIAGNOSIS — N186 End stage renal disease: Secondary | ICD-10-CM | POA: Diagnosis not present

## 2012-12-15 DIAGNOSIS — E876 Hypokalemia: Secondary | ICD-10-CM | POA: Diagnosis not present

## 2012-12-17 DIAGNOSIS — D631 Anemia in chronic kidney disease: Secondary | ICD-10-CM | POA: Diagnosis not present

## 2012-12-17 DIAGNOSIS — N186 End stage renal disease: Secondary | ICD-10-CM | POA: Diagnosis not present

## 2012-12-17 DIAGNOSIS — E876 Hypokalemia: Secondary | ICD-10-CM | POA: Diagnosis not present

## 2012-12-17 DIAGNOSIS — N2581 Secondary hyperparathyroidism of renal origin: Secondary | ICD-10-CM | POA: Diagnosis not present

## 2012-12-19 DIAGNOSIS — D631 Anemia in chronic kidney disease: Secondary | ICD-10-CM | POA: Diagnosis not present

## 2012-12-19 DIAGNOSIS — N186 End stage renal disease: Secondary | ICD-10-CM | POA: Diagnosis not present

## 2012-12-19 DIAGNOSIS — N2581 Secondary hyperparathyroidism of renal origin: Secondary | ICD-10-CM | POA: Diagnosis not present

## 2012-12-19 DIAGNOSIS — E876 Hypokalemia: Secondary | ICD-10-CM | POA: Diagnosis not present

## 2012-12-22 DIAGNOSIS — N2581 Secondary hyperparathyroidism of renal origin: Secondary | ICD-10-CM | POA: Diagnosis not present

## 2012-12-22 DIAGNOSIS — N186 End stage renal disease: Secondary | ICD-10-CM | POA: Diagnosis not present

## 2012-12-22 DIAGNOSIS — E876 Hypokalemia: Secondary | ICD-10-CM | POA: Diagnosis not present

## 2012-12-22 DIAGNOSIS — D631 Anemia in chronic kidney disease: Secondary | ICD-10-CM | POA: Diagnosis not present

## 2012-12-30 DIAGNOSIS — Z09 Encounter for follow-up examination after completed treatment for conditions other than malignant neoplasm: Secondary | ICD-10-CM | POA: Diagnosis not present

## 2013-01-16 DIAGNOSIS — N186 End stage renal disease: Secondary | ICD-10-CM | POA: Diagnosis not present

## 2013-01-19 DIAGNOSIS — N2581 Secondary hyperparathyroidism of renal origin: Secondary | ICD-10-CM | POA: Diagnosis not present

## 2013-01-19 DIAGNOSIS — D631 Anemia in chronic kidney disease: Secondary | ICD-10-CM | POA: Diagnosis not present

## 2013-01-19 DIAGNOSIS — E876 Hypokalemia: Secondary | ICD-10-CM | POA: Diagnosis not present

## 2013-01-19 DIAGNOSIS — N186 End stage renal disease: Secondary | ICD-10-CM | POA: Diagnosis not present

## 2013-02-16 DIAGNOSIS — N186 End stage renal disease: Secondary | ICD-10-CM | POA: Diagnosis not present

## 2013-02-18 DIAGNOSIS — N186 End stage renal disease: Secondary | ICD-10-CM | POA: Diagnosis not present

## 2013-02-18 DIAGNOSIS — D631 Anemia in chronic kidney disease: Secondary | ICD-10-CM | POA: Diagnosis not present

## 2013-02-18 DIAGNOSIS — N2581 Secondary hyperparathyroidism of renal origin: Secondary | ICD-10-CM | POA: Diagnosis not present

## 2013-03-18 DIAGNOSIS — N186 End stage renal disease: Secondary | ICD-10-CM | POA: Diagnosis not present

## 2013-03-20 DIAGNOSIS — D631 Anemia in chronic kidney disease: Secondary | ICD-10-CM | POA: Diagnosis not present

## 2013-03-20 DIAGNOSIS — N186 End stage renal disease: Secondary | ICD-10-CM | POA: Diagnosis not present

## 2013-03-20 DIAGNOSIS — N2581 Secondary hyperparathyroidism of renal origin: Secondary | ICD-10-CM | POA: Diagnosis not present

## 2013-04-18 DIAGNOSIS — N186 End stage renal disease: Secondary | ICD-10-CM | POA: Diagnosis not present

## 2013-04-20 DIAGNOSIS — N2581 Secondary hyperparathyroidism of renal origin: Secondary | ICD-10-CM | POA: Diagnosis not present

## 2013-04-20 DIAGNOSIS — D631 Anemia in chronic kidney disease: Secondary | ICD-10-CM | POA: Diagnosis not present

## 2013-04-20 DIAGNOSIS — N186 End stage renal disease: Secondary | ICD-10-CM | POA: Diagnosis not present

## 2013-04-22 DIAGNOSIS — N2581 Secondary hyperparathyroidism of renal origin: Secondary | ICD-10-CM | POA: Diagnosis not present

## 2013-04-22 DIAGNOSIS — N186 End stage renal disease: Secondary | ICD-10-CM | POA: Diagnosis not present

## 2013-04-22 DIAGNOSIS — D631 Anemia in chronic kidney disease: Secondary | ICD-10-CM | POA: Diagnosis not present

## 2013-04-24 DIAGNOSIS — N186 End stage renal disease: Secondary | ICD-10-CM | POA: Diagnosis not present

## 2013-04-24 DIAGNOSIS — N2581 Secondary hyperparathyroidism of renal origin: Secondary | ICD-10-CM | POA: Diagnosis not present

## 2013-04-24 DIAGNOSIS — N039 Chronic nephritic syndrome with unspecified morphologic changes: Secondary | ICD-10-CM | POA: Diagnosis not present

## 2013-04-27 DIAGNOSIS — N039 Chronic nephritic syndrome with unspecified morphologic changes: Secondary | ICD-10-CM | POA: Diagnosis not present

## 2013-04-27 DIAGNOSIS — N2581 Secondary hyperparathyroidism of renal origin: Secondary | ICD-10-CM | POA: Diagnosis not present

## 2013-04-27 DIAGNOSIS — N186 End stage renal disease: Secondary | ICD-10-CM | POA: Diagnosis not present

## 2013-04-29 DIAGNOSIS — N2581 Secondary hyperparathyroidism of renal origin: Secondary | ICD-10-CM | POA: Diagnosis not present

## 2013-04-29 DIAGNOSIS — N039 Chronic nephritic syndrome with unspecified morphologic changes: Secondary | ICD-10-CM | POA: Diagnosis not present

## 2013-04-29 DIAGNOSIS — N186 End stage renal disease: Secondary | ICD-10-CM | POA: Diagnosis not present

## 2013-05-01 DIAGNOSIS — N2581 Secondary hyperparathyroidism of renal origin: Secondary | ICD-10-CM | POA: Diagnosis not present

## 2013-05-01 DIAGNOSIS — D631 Anemia in chronic kidney disease: Secondary | ICD-10-CM | POA: Diagnosis not present

## 2013-05-01 DIAGNOSIS — N186 End stage renal disease: Secondary | ICD-10-CM | POA: Diagnosis not present

## 2013-05-04 DIAGNOSIS — D631 Anemia in chronic kidney disease: Secondary | ICD-10-CM | POA: Diagnosis not present

## 2013-05-04 DIAGNOSIS — N2581 Secondary hyperparathyroidism of renal origin: Secondary | ICD-10-CM | POA: Diagnosis not present

## 2013-05-04 DIAGNOSIS — N186 End stage renal disease: Secondary | ICD-10-CM | POA: Diagnosis not present

## 2013-05-06 DIAGNOSIS — N186 End stage renal disease: Secondary | ICD-10-CM | POA: Diagnosis not present

## 2013-05-06 DIAGNOSIS — D631 Anemia in chronic kidney disease: Secondary | ICD-10-CM | POA: Diagnosis not present

## 2013-05-06 DIAGNOSIS — N2581 Secondary hyperparathyroidism of renal origin: Secondary | ICD-10-CM | POA: Diagnosis not present

## 2013-05-06 DIAGNOSIS — N039 Chronic nephritic syndrome with unspecified morphologic changes: Secondary | ICD-10-CM | POA: Diagnosis not present

## 2013-05-08 DIAGNOSIS — N186 End stage renal disease: Secondary | ICD-10-CM | POA: Diagnosis not present

## 2013-05-08 DIAGNOSIS — D631 Anemia in chronic kidney disease: Secondary | ICD-10-CM | POA: Diagnosis not present

## 2013-05-08 DIAGNOSIS — N2581 Secondary hyperparathyroidism of renal origin: Secondary | ICD-10-CM | POA: Diagnosis not present

## 2013-05-08 DIAGNOSIS — N039 Chronic nephritic syndrome with unspecified morphologic changes: Secondary | ICD-10-CM | POA: Diagnosis not present

## 2013-05-11 DIAGNOSIS — N2581 Secondary hyperparathyroidism of renal origin: Secondary | ICD-10-CM | POA: Diagnosis not present

## 2013-05-11 DIAGNOSIS — N039 Chronic nephritic syndrome with unspecified morphologic changes: Secondary | ICD-10-CM | POA: Diagnosis not present

## 2013-05-11 DIAGNOSIS — N186 End stage renal disease: Secondary | ICD-10-CM | POA: Diagnosis not present

## 2013-05-13 DIAGNOSIS — N2581 Secondary hyperparathyroidism of renal origin: Secondary | ICD-10-CM | POA: Diagnosis not present

## 2013-05-13 DIAGNOSIS — D631 Anemia in chronic kidney disease: Secondary | ICD-10-CM | POA: Diagnosis not present

## 2013-05-13 DIAGNOSIS — N186 End stage renal disease: Secondary | ICD-10-CM | POA: Diagnosis not present

## 2013-05-15 DIAGNOSIS — N186 End stage renal disease: Secondary | ICD-10-CM | POA: Diagnosis not present

## 2013-05-15 DIAGNOSIS — N2581 Secondary hyperparathyroidism of renal origin: Secondary | ICD-10-CM | POA: Diagnosis not present

## 2013-05-15 DIAGNOSIS — D631 Anemia in chronic kidney disease: Secondary | ICD-10-CM | POA: Diagnosis not present

## 2013-05-18 DIAGNOSIS — N186 End stage renal disease: Secondary | ICD-10-CM | POA: Diagnosis not present

## 2013-05-18 DIAGNOSIS — D631 Anemia in chronic kidney disease: Secondary | ICD-10-CM | POA: Diagnosis not present

## 2013-05-18 DIAGNOSIS — N2581 Secondary hyperparathyroidism of renal origin: Secondary | ICD-10-CM | POA: Diagnosis not present

## 2013-05-20 DIAGNOSIS — N186 End stage renal disease: Secondary | ICD-10-CM | POA: Diagnosis not present

## 2013-05-20 DIAGNOSIS — D509 Iron deficiency anemia, unspecified: Secondary | ICD-10-CM | POA: Diagnosis not present

## 2013-05-20 DIAGNOSIS — D631 Anemia in chronic kidney disease: Secondary | ICD-10-CM | POA: Diagnosis not present

## 2013-05-20 DIAGNOSIS — N2581 Secondary hyperparathyroidism of renal origin: Secondary | ICD-10-CM | POA: Diagnosis not present

## 2013-06-18 DIAGNOSIS — N186 End stage renal disease: Secondary | ICD-10-CM | POA: Diagnosis not present

## 2013-06-19 DIAGNOSIS — N2581 Secondary hyperparathyroidism of renal origin: Secondary | ICD-10-CM | POA: Diagnosis not present

## 2013-06-19 DIAGNOSIS — D631 Anemia in chronic kidney disease: Secondary | ICD-10-CM | POA: Diagnosis not present

## 2013-06-19 DIAGNOSIS — N186 End stage renal disease: Secondary | ICD-10-CM | POA: Diagnosis not present

## 2013-06-19 DIAGNOSIS — N039 Chronic nephritic syndrome with unspecified morphologic changes: Secondary | ICD-10-CM | POA: Diagnosis not present

## 2013-06-19 DIAGNOSIS — D509 Iron deficiency anemia, unspecified: Secondary | ICD-10-CM | POA: Diagnosis not present

## 2013-06-22 DIAGNOSIS — D631 Anemia in chronic kidney disease: Secondary | ICD-10-CM | POA: Diagnosis not present

## 2013-06-22 DIAGNOSIS — N186 End stage renal disease: Secondary | ICD-10-CM | POA: Diagnosis not present

## 2013-06-22 DIAGNOSIS — N2581 Secondary hyperparathyroidism of renal origin: Secondary | ICD-10-CM | POA: Diagnosis not present

## 2013-06-22 DIAGNOSIS — D509 Iron deficiency anemia, unspecified: Secondary | ICD-10-CM | POA: Diagnosis not present

## 2013-06-24 DIAGNOSIS — D509 Iron deficiency anemia, unspecified: Secondary | ICD-10-CM | POA: Diagnosis not present

## 2013-06-24 DIAGNOSIS — N039 Chronic nephritic syndrome with unspecified morphologic changes: Secondary | ICD-10-CM | POA: Diagnosis not present

## 2013-06-24 DIAGNOSIS — N186 End stage renal disease: Secondary | ICD-10-CM | POA: Diagnosis not present

## 2013-06-24 DIAGNOSIS — N2581 Secondary hyperparathyroidism of renal origin: Secondary | ICD-10-CM | POA: Diagnosis not present

## 2013-06-26 DIAGNOSIS — N186 End stage renal disease: Secondary | ICD-10-CM | POA: Diagnosis not present

## 2013-06-26 DIAGNOSIS — D509 Iron deficiency anemia, unspecified: Secondary | ICD-10-CM | POA: Diagnosis not present

## 2013-06-26 DIAGNOSIS — N039 Chronic nephritic syndrome with unspecified morphologic changes: Secondary | ICD-10-CM | POA: Diagnosis not present

## 2013-06-26 DIAGNOSIS — N2581 Secondary hyperparathyroidism of renal origin: Secondary | ICD-10-CM | POA: Diagnosis not present

## 2013-06-29 DIAGNOSIS — D509 Iron deficiency anemia, unspecified: Secondary | ICD-10-CM | POA: Diagnosis not present

## 2013-06-29 DIAGNOSIS — D631 Anemia in chronic kidney disease: Secondary | ICD-10-CM | POA: Diagnosis not present

## 2013-06-29 DIAGNOSIS — N186 End stage renal disease: Secondary | ICD-10-CM | POA: Diagnosis not present

## 2013-06-29 DIAGNOSIS — N2581 Secondary hyperparathyroidism of renal origin: Secondary | ICD-10-CM | POA: Diagnosis not present

## 2013-07-01 DIAGNOSIS — D509 Iron deficiency anemia, unspecified: Secondary | ICD-10-CM | POA: Diagnosis not present

## 2013-07-01 DIAGNOSIS — N2581 Secondary hyperparathyroidism of renal origin: Secondary | ICD-10-CM | POA: Diagnosis not present

## 2013-07-01 DIAGNOSIS — N039 Chronic nephritic syndrome with unspecified morphologic changes: Secondary | ICD-10-CM | POA: Diagnosis not present

## 2013-07-01 DIAGNOSIS — D631 Anemia in chronic kidney disease: Secondary | ICD-10-CM | POA: Diagnosis not present

## 2013-07-01 DIAGNOSIS — N186 End stage renal disease: Secondary | ICD-10-CM | POA: Diagnosis not present

## 2013-07-03 DIAGNOSIS — N039 Chronic nephritic syndrome with unspecified morphologic changes: Secondary | ICD-10-CM | POA: Diagnosis not present

## 2013-07-03 DIAGNOSIS — N186 End stage renal disease: Secondary | ICD-10-CM | POA: Diagnosis not present

## 2013-07-03 DIAGNOSIS — N2581 Secondary hyperparathyroidism of renal origin: Secondary | ICD-10-CM | POA: Diagnosis not present

## 2013-07-03 DIAGNOSIS — D509 Iron deficiency anemia, unspecified: Secondary | ICD-10-CM | POA: Diagnosis not present

## 2013-07-06 DIAGNOSIS — N186 End stage renal disease: Secondary | ICD-10-CM | POA: Diagnosis not present

## 2013-07-06 DIAGNOSIS — D631 Anemia in chronic kidney disease: Secondary | ICD-10-CM | POA: Diagnosis not present

## 2013-07-06 DIAGNOSIS — D509 Iron deficiency anemia, unspecified: Secondary | ICD-10-CM | POA: Diagnosis not present

## 2013-07-06 DIAGNOSIS — N2581 Secondary hyperparathyroidism of renal origin: Secondary | ICD-10-CM | POA: Diagnosis not present

## 2013-07-08 DIAGNOSIS — N2581 Secondary hyperparathyroidism of renal origin: Secondary | ICD-10-CM | POA: Diagnosis not present

## 2013-07-08 DIAGNOSIS — D631 Anemia in chronic kidney disease: Secondary | ICD-10-CM | POA: Diagnosis not present

## 2013-07-08 DIAGNOSIS — N186 End stage renal disease: Secondary | ICD-10-CM | POA: Diagnosis not present

## 2013-07-08 DIAGNOSIS — D509 Iron deficiency anemia, unspecified: Secondary | ICD-10-CM | POA: Diagnosis not present

## 2013-07-10 DIAGNOSIS — N2581 Secondary hyperparathyroidism of renal origin: Secondary | ICD-10-CM | POA: Diagnosis not present

## 2013-07-10 DIAGNOSIS — D631 Anemia in chronic kidney disease: Secondary | ICD-10-CM | POA: Diagnosis not present

## 2013-07-10 DIAGNOSIS — N186 End stage renal disease: Secondary | ICD-10-CM | POA: Diagnosis not present

## 2013-07-10 DIAGNOSIS — D509 Iron deficiency anemia, unspecified: Secondary | ICD-10-CM | POA: Diagnosis not present

## 2013-07-13 DIAGNOSIS — D631 Anemia in chronic kidney disease: Secondary | ICD-10-CM | POA: Diagnosis not present

## 2013-07-13 DIAGNOSIS — N2581 Secondary hyperparathyroidism of renal origin: Secondary | ICD-10-CM | POA: Diagnosis not present

## 2013-07-13 DIAGNOSIS — D509 Iron deficiency anemia, unspecified: Secondary | ICD-10-CM | POA: Diagnosis not present

## 2013-07-13 DIAGNOSIS — N186 End stage renal disease: Secondary | ICD-10-CM | POA: Diagnosis not present

## 2013-07-15 DIAGNOSIS — D631 Anemia in chronic kidney disease: Secondary | ICD-10-CM | POA: Diagnosis not present

## 2013-07-15 DIAGNOSIS — D509 Iron deficiency anemia, unspecified: Secondary | ICD-10-CM | POA: Diagnosis not present

## 2013-07-15 DIAGNOSIS — N186 End stage renal disease: Secondary | ICD-10-CM | POA: Diagnosis not present

## 2013-07-15 DIAGNOSIS — N2581 Secondary hyperparathyroidism of renal origin: Secondary | ICD-10-CM | POA: Diagnosis not present

## 2013-07-17 DIAGNOSIS — N186 End stage renal disease: Secondary | ICD-10-CM | POA: Diagnosis not present

## 2013-07-17 DIAGNOSIS — D509 Iron deficiency anemia, unspecified: Secondary | ICD-10-CM | POA: Diagnosis not present

## 2013-07-17 DIAGNOSIS — N2581 Secondary hyperparathyroidism of renal origin: Secondary | ICD-10-CM | POA: Diagnosis not present

## 2013-07-17 DIAGNOSIS — D631 Anemia in chronic kidney disease: Secondary | ICD-10-CM | POA: Diagnosis not present

## 2013-07-19 DIAGNOSIS — N186 End stage renal disease: Secondary | ICD-10-CM | POA: Diagnosis not present

## 2013-07-20 DIAGNOSIS — N186 End stage renal disease: Secondary | ICD-10-CM | POA: Diagnosis not present

## 2013-07-20 DIAGNOSIS — D631 Anemia in chronic kidney disease: Secondary | ICD-10-CM | POA: Diagnosis not present

## 2013-07-20 DIAGNOSIS — N2581 Secondary hyperparathyroidism of renal origin: Secondary | ICD-10-CM | POA: Diagnosis not present

## 2013-07-20 DIAGNOSIS — D509 Iron deficiency anemia, unspecified: Secondary | ICD-10-CM | POA: Diagnosis not present

## 2013-07-22 DIAGNOSIS — D509 Iron deficiency anemia, unspecified: Secondary | ICD-10-CM | POA: Diagnosis not present

## 2013-07-22 DIAGNOSIS — D631 Anemia in chronic kidney disease: Secondary | ICD-10-CM | POA: Diagnosis not present

## 2013-07-22 DIAGNOSIS — N2581 Secondary hyperparathyroidism of renal origin: Secondary | ICD-10-CM | POA: Diagnosis not present

## 2013-07-22 DIAGNOSIS — N186 End stage renal disease: Secondary | ICD-10-CM | POA: Diagnosis not present

## 2013-07-24 DIAGNOSIS — N186 End stage renal disease: Secondary | ICD-10-CM | POA: Diagnosis not present

## 2013-07-24 DIAGNOSIS — D631 Anemia in chronic kidney disease: Secondary | ICD-10-CM | POA: Diagnosis not present

## 2013-07-24 DIAGNOSIS — D509 Iron deficiency anemia, unspecified: Secondary | ICD-10-CM | POA: Diagnosis not present

## 2013-07-24 DIAGNOSIS — N2581 Secondary hyperparathyroidism of renal origin: Secondary | ICD-10-CM | POA: Diagnosis not present

## 2013-07-27 DIAGNOSIS — N186 End stage renal disease: Secondary | ICD-10-CM | POA: Diagnosis not present

## 2013-07-27 DIAGNOSIS — N2581 Secondary hyperparathyroidism of renal origin: Secondary | ICD-10-CM | POA: Diagnosis not present

## 2013-07-27 DIAGNOSIS — D631 Anemia in chronic kidney disease: Secondary | ICD-10-CM | POA: Diagnosis not present

## 2013-07-27 DIAGNOSIS — D509 Iron deficiency anemia, unspecified: Secondary | ICD-10-CM | POA: Diagnosis not present

## 2013-07-29 DIAGNOSIS — N186 End stage renal disease: Secondary | ICD-10-CM | POA: Diagnosis not present

## 2013-07-29 DIAGNOSIS — N2581 Secondary hyperparathyroidism of renal origin: Secondary | ICD-10-CM | POA: Diagnosis not present

## 2013-07-29 DIAGNOSIS — D631 Anemia in chronic kidney disease: Secondary | ICD-10-CM | POA: Diagnosis not present

## 2013-07-29 DIAGNOSIS — D509 Iron deficiency anemia, unspecified: Secondary | ICD-10-CM | POA: Diagnosis not present

## 2013-07-31 DIAGNOSIS — N2581 Secondary hyperparathyroidism of renal origin: Secondary | ICD-10-CM | POA: Diagnosis not present

## 2013-07-31 DIAGNOSIS — D631 Anemia in chronic kidney disease: Secondary | ICD-10-CM | POA: Diagnosis not present

## 2013-07-31 DIAGNOSIS — N186 End stage renal disease: Secondary | ICD-10-CM | POA: Diagnosis not present

## 2013-07-31 DIAGNOSIS — D509 Iron deficiency anemia, unspecified: Secondary | ICD-10-CM | POA: Diagnosis not present

## 2013-08-03 DIAGNOSIS — N2581 Secondary hyperparathyroidism of renal origin: Secondary | ICD-10-CM | POA: Diagnosis not present

## 2013-08-03 DIAGNOSIS — D631 Anemia in chronic kidney disease: Secondary | ICD-10-CM | POA: Diagnosis not present

## 2013-08-03 DIAGNOSIS — D509 Iron deficiency anemia, unspecified: Secondary | ICD-10-CM | POA: Diagnosis not present

## 2013-08-03 DIAGNOSIS — N186 End stage renal disease: Secondary | ICD-10-CM | POA: Diagnosis not present

## 2013-08-05 DIAGNOSIS — N186 End stage renal disease: Secondary | ICD-10-CM | POA: Diagnosis not present

## 2013-08-05 DIAGNOSIS — N2581 Secondary hyperparathyroidism of renal origin: Secondary | ICD-10-CM | POA: Diagnosis not present

## 2013-08-05 DIAGNOSIS — D631 Anemia in chronic kidney disease: Secondary | ICD-10-CM | POA: Diagnosis not present

## 2013-08-05 DIAGNOSIS — D509 Iron deficiency anemia, unspecified: Secondary | ICD-10-CM | POA: Diagnosis not present

## 2013-08-07 DIAGNOSIS — N186 End stage renal disease: Secondary | ICD-10-CM | POA: Diagnosis not present

## 2013-08-07 DIAGNOSIS — N2581 Secondary hyperparathyroidism of renal origin: Secondary | ICD-10-CM | POA: Diagnosis not present

## 2013-08-07 DIAGNOSIS — D509 Iron deficiency anemia, unspecified: Secondary | ICD-10-CM | POA: Diagnosis not present

## 2013-08-07 DIAGNOSIS — D631 Anemia in chronic kidney disease: Secondary | ICD-10-CM | POA: Diagnosis not present

## 2013-08-10 DIAGNOSIS — N2581 Secondary hyperparathyroidism of renal origin: Secondary | ICD-10-CM | POA: Diagnosis not present

## 2013-08-10 DIAGNOSIS — N186 End stage renal disease: Secondary | ICD-10-CM | POA: Diagnosis not present

## 2013-08-10 DIAGNOSIS — D631 Anemia in chronic kidney disease: Secondary | ICD-10-CM | POA: Diagnosis not present

## 2013-08-10 DIAGNOSIS — D509 Iron deficiency anemia, unspecified: Secondary | ICD-10-CM | POA: Diagnosis not present

## 2013-08-11 ENCOUNTER — Ambulatory Visit (HOSPITAL_COMMUNITY): Payer: Medicare Other | Admitting: Licensed Clinical Social Worker

## 2013-08-12 DIAGNOSIS — N186 End stage renal disease: Secondary | ICD-10-CM | POA: Diagnosis not present

## 2013-08-12 DIAGNOSIS — D631 Anemia in chronic kidney disease: Secondary | ICD-10-CM | POA: Diagnosis not present

## 2013-08-12 DIAGNOSIS — N2581 Secondary hyperparathyroidism of renal origin: Secondary | ICD-10-CM | POA: Diagnosis not present

## 2013-08-12 DIAGNOSIS — D509 Iron deficiency anemia, unspecified: Secondary | ICD-10-CM | POA: Diagnosis not present

## 2013-08-14 DIAGNOSIS — N186 End stage renal disease: Secondary | ICD-10-CM | POA: Diagnosis not present

## 2013-08-14 DIAGNOSIS — D631 Anemia in chronic kidney disease: Secondary | ICD-10-CM | POA: Diagnosis not present

## 2013-08-14 DIAGNOSIS — N2581 Secondary hyperparathyroidism of renal origin: Secondary | ICD-10-CM | POA: Diagnosis not present

## 2013-08-14 DIAGNOSIS — D509 Iron deficiency anemia, unspecified: Secondary | ICD-10-CM | POA: Diagnosis not present

## 2013-08-17 DIAGNOSIS — N186 End stage renal disease: Secondary | ICD-10-CM | POA: Diagnosis not present

## 2013-08-17 DIAGNOSIS — D509 Iron deficiency anemia, unspecified: Secondary | ICD-10-CM | POA: Diagnosis not present

## 2013-08-17 DIAGNOSIS — N2581 Secondary hyperparathyroidism of renal origin: Secondary | ICD-10-CM | POA: Diagnosis not present

## 2013-08-17 DIAGNOSIS — D631 Anemia in chronic kidney disease: Secondary | ICD-10-CM | POA: Diagnosis not present

## 2013-08-18 DIAGNOSIS — N186 End stage renal disease: Secondary | ICD-10-CM | POA: Diagnosis not present

## 2013-08-19 DIAGNOSIS — N186 End stage renal disease: Secondary | ICD-10-CM | POA: Diagnosis not present

## 2013-08-19 DIAGNOSIS — D631 Anemia in chronic kidney disease: Secondary | ICD-10-CM | POA: Diagnosis not present

## 2013-08-19 DIAGNOSIS — D509 Iron deficiency anemia, unspecified: Secondary | ICD-10-CM | POA: Diagnosis not present

## 2013-08-19 DIAGNOSIS — N2581 Secondary hyperparathyroidism of renal origin: Secondary | ICD-10-CM | POA: Diagnosis not present

## 2013-08-19 DIAGNOSIS — Z23 Encounter for immunization: Secondary | ICD-10-CM | POA: Diagnosis not present

## 2013-09-18 DIAGNOSIS — N186 End stage renal disease: Secondary | ICD-10-CM | POA: Diagnosis not present

## 2013-09-21 DIAGNOSIS — N186 End stage renal disease: Secondary | ICD-10-CM | POA: Diagnosis not present

## 2013-09-21 DIAGNOSIS — N2581 Secondary hyperparathyroidism of renal origin: Secondary | ICD-10-CM | POA: Diagnosis not present

## 2013-09-21 DIAGNOSIS — D631 Anemia in chronic kidney disease: Secondary | ICD-10-CM | POA: Diagnosis not present

## 2013-10-06 DIAGNOSIS — Z79899 Other long term (current) drug therapy: Secondary | ICD-10-CM | POA: Diagnosis not present

## 2013-10-09 ENCOUNTER — Emergency Department (HOSPITAL_COMMUNITY): Payer: Medicare Other | Admitting: Anesthesiology

## 2013-10-09 ENCOUNTER — Encounter (HOSPITAL_COMMUNITY): Payer: Medicare Other | Admitting: Anesthesiology

## 2013-10-09 ENCOUNTER — Observation Stay (HOSPITAL_COMMUNITY)
Admission: EM | Admit: 2013-10-09 | Discharge: 2013-10-10 | Disposition: A | Discharge status: Home / Self Care | Payer: Medicare Other | Attending: Vascular Surgery | Admitting: Vascular Surgery

## 2013-10-09 ENCOUNTER — Telehealth: Payer: Self-pay | Admitting: Vascular Surgery

## 2013-10-09 ENCOUNTER — Encounter (HOSPITAL_COMMUNITY): Payer: Self-pay | Admitting: Emergency Medicine

## 2013-10-09 ENCOUNTER — Encounter (HOSPITAL_COMMUNITY)
Admission: EM | Disposition: A | Discharge status: Home / Self Care | Payer: Self-pay | Source: Home / Self Care | Attending: Vascular Surgery

## 2013-10-09 ENCOUNTER — Inpatient Hospital Stay: Admit: 2013-10-09 | Payer: Self-pay | Admitting: Vascular Surgery

## 2013-10-09 DIAGNOSIS — M899 Disorder of bone, unspecified: Secondary | ICD-10-CM | POA: Insufficient documentation

## 2013-10-09 DIAGNOSIS — Z992 Dependence on renal dialysis: Secondary | ICD-10-CM | POA: Diagnosis not present

## 2013-10-09 DIAGNOSIS — E785 Hyperlipidemia, unspecified: Secondary | ICD-10-CM | POA: Insufficient documentation

## 2013-10-09 DIAGNOSIS — R6889 Other general symptoms and signs: Secondary | ICD-10-CM | POA: Diagnosis not present

## 2013-10-09 DIAGNOSIS — Z602 Problems related to living alone: Secondary | ICD-10-CM | POA: Diagnosis not present

## 2013-10-09 DIAGNOSIS — D649 Anemia, unspecified: Secondary | ICD-10-CM | POA: Insufficient documentation

## 2013-10-09 DIAGNOSIS — IMO0002 Reserved for concepts with insufficient information to code with codable children: Secondary | ICD-10-CM | POA: Insufficient documentation

## 2013-10-09 DIAGNOSIS — M329 Systemic lupus erythematosus, unspecified: Secondary | ICD-10-CM | POA: Insufficient documentation

## 2013-10-09 DIAGNOSIS — N186 End stage renal disease: Secondary | ICD-10-CM

## 2013-10-09 DIAGNOSIS — S40029A Contusion of unspecified upper arm, initial encounter: Secondary | ICD-10-CM | POA: Diagnosis not present

## 2013-10-09 DIAGNOSIS — D631 Anemia in chronic kidney disease: Secondary | ICD-10-CM | POA: Diagnosis not present

## 2013-10-09 DIAGNOSIS — T82898A Other specified complication of vascular prosthetic devices, implants and grafts, initial encounter: Principal | ICD-10-CM | POA: Insufficient documentation

## 2013-10-09 DIAGNOSIS — N2581 Secondary hyperparathyroidism of renal origin: Secondary | ICD-10-CM | POA: Diagnosis not present

## 2013-10-09 DIAGNOSIS — T8389XA Other specified complication of genitourinary prosthetic devices, implants and grafts, initial encounter: Secondary | ICD-10-CM | POA: Diagnosis not present

## 2013-10-09 DIAGNOSIS — I10 Essential (primary) hypertension: Secondary | ICD-10-CM | POA: Diagnosis not present

## 2013-10-09 DIAGNOSIS — I120 Hypertensive chronic kidney disease with stage 5 chronic kidney disease or end stage renal disease: Secondary | ICD-10-CM | POA: Diagnosis not present

## 2013-10-09 DIAGNOSIS — T829XXA Unspecified complication of cardiac and vascular prosthetic device, implant and graft, initial encounter: Secondary | ICD-10-CM

## 2013-10-09 DIAGNOSIS — T148XXA Other injury of unspecified body region, initial encounter: Secondary | ICD-10-CM

## 2013-10-09 DIAGNOSIS — M7989 Other specified soft tissue disorders: Secondary | ICD-10-CM | POA: Diagnosis not present

## 2013-10-09 DIAGNOSIS — N189 Chronic kidney disease, unspecified: Secondary | ICD-10-CM

## 2013-10-09 DIAGNOSIS — Y832 Surgical operation with anastomosis, bypass or graft as the cause of abnormal reaction of the patient, or of later complication, without mention of misadventure at the time of the procedure: Secondary | ICD-10-CM | POA: Insufficient documentation

## 2013-10-09 HISTORY — PX: INSERTION OF DIALYSIS CATHETER: SHX1324

## 2013-10-09 HISTORY — DX: End stage renal disease: N18.6

## 2013-10-09 HISTORY — DX: Hyperlipidemia, unspecified: E78.5

## 2013-10-09 HISTORY — DX: Essential (primary) hypertension: I10

## 2013-10-09 HISTORY — DX: Anemia, unspecified: D64.9

## 2013-10-09 HISTORY — DX: Systemic lupus erythematosus, unspecified: M32.9

## 2013-10-09 HISTORY — DX: Secondary hyperparathyroidism of renal origin: N25.81

## 2013-10-09 HISTORY — DX: End stage renal disease: Z99.2

## 2013-10-09 HISTORY — DX: Idiopathic aseptic necrosis of unspecified bone: M87.00

## 2013-10-09 HISTORY — PX: HEMATOMA EVACUATION: SHX5118

## 2013-10-09 LAB — CBC WITH DIFFERENTIAL/PLATELET
Basophils Relative: 0 % (ref 0–1)
HCT: 31.3 % — ABNORMAL LOW (ref 36.0–46.0)
Hemoglobin: 10.7 g/dL — ABNORMAL LOW (ref 12.0–15.0)
Lymphocytes Relative: 5 % — ABNORMAL LOW (ref 12–46)
Lymphs Abs: 0.8 10*3/uL (ref 0.7–4.0)
MCHC: 34.2 g/dL (ref 30.0–36.0)
Monocytes Absolute: 0.5 10*3/uL (ref 0.1–1.0)
Monocytes Relative: 3 % (ref 3–12)
Neutro Abs: 14.9 10*3/uL — ABNORMAL HIGH (ref 1.7–7.7)
Neutrophils Relative %: 91 % — ABNORMAL HIGH (ref 43–77)
Platelets: 210 10*3/uL (ref 150–400)
RBC: 3.45 MIL/uL — ABNORMAL LOW (ref 3.87–5.11)

## 2013-10-09 LAB — POCT I-STAT, CHEM 8
BUN: 51 mg/dL — ABNORMAL HIGH (ref 6–23)
Chloride: 98 mEq/L (ref 96–112)
Creatinine, Ser: 8.3 mg/dL — ABNORMAL HIGH (ref 0.50–1.10)
Glucose, Bld: 67 mg/dL — ABNORMAL LOW (ref 70–99)
HCT: 37 % (ref 36.0–46.0)
Potassium: 3.7 mEq/L (ref 3.5–5.1)

## 2013-10-09 SURGERY — EVACUATION HEMATOMA
Anesthesia: General | Site: Arm Upper | Wound class: Clean

## 2013-10-09 MED ORDER — PHENYLEPHRINE HCL 10 MG/ML IJ SOLN
INTRAMUSCULAR | Status: DC | PRN
  Administered 2013-10-09 (×3): 80 ug via INTRAVENOUS
  Administered 2013-10-09: 40 ug via INTRAVENOUS
  Administered 2013-10-09: 80 ug via INTRAVENOUS
  Administered 2013-10-09: 40 ug via INTRAVENOUS

## 2013-10-09 MED ORDER — HEPARIN SODIUM (PORCINE) 1000 UNIT/ML DIALYSIS
1000.0000 [IU] | INTRAMUSCULAR | Status: DC | PRN
  Filled 2013-10-09: qty 1

## 2013-10-09 MED ORDER — FUROSEMIDE 80 MG PO TABS
80.0000 mg | ORAL_TABLET | Freq: Two times a day (BID) | ORAL | Status: DC
  Filled 2013-10-09 (×4): qty 1

## 2013-10-09 MED ORDER — DOXERCALCIFEROL 4 MCG/2ML IV SOLN
1.0000 ug | Freq: Once | INTRAVENOUS | Status: DC
  Filled 2013-10-09: qty 2

## 2013-10-09 MED ORDER — MIDAZOLAM HCL 5 MG/5ML IJ SOLN
INTRAMUSCULAR | Status: DC | PRN
  Administered 2013-10-09: 2 mg via INTRAVENOUS

## 2013-10-09 MED ORDER — CEFAZOLIN SODIUM 1-5 GM-% IV SOLN
INTRAVENOUS | Status: AC
Start: 2013-10-09 — End: 2013-10-09
  Filled 2013-10-09: qty 100

## 2013-10-09 MED ORDER — LIDOCAINE HCL (PF) 1 % IJ SOLN: 5.0000 mL | INTRAMUSCULAR | Status: DC | PRN

## 2013-10-09 MED ORDER — MORPHINE SULFATE 4 MG/ML IJ SOLN
4.0000 mg | Freq: Once | INTRAMUSCULAR | Status: AC
  Administered 2013-10-09: 4 mg via INTRAVENOUS
  Filled 2013-10-09: qty 1

## 2013-10-09 MED ORDER — ZOLPIDEM TARTRATE 5 MG PO TABS: 5.0000 mg | ORAL_TABLET | Freq: Every evening | ORAL | Status: DC | PRN

## 2013-10-09 MED ORDER — HEPARIN SODIUM (PORCINE) 1000 UNIT/ML IJ SOLN
INTRAMUSCULAR | Status: DC | PRN
  Administered 2013-10-09: 4000 [IU] via INTRAVENOUS

## 2013-10-09 MED ORDER — CALCIUM ACETATE 667 MG PO CAPS
667.0000 mg | ORAL_CAPSULE | Freq: Two times a day (BID) | ORAL | Status: DC
  Filled 2013-10-09 (×3): qty 1

## 2013-10-09 MED ORDER — GUAIFENESIN-DM 100-10 MG/5ML PO SYRP: 15.0000 mL | ORAL_SOLUTION | ORAL | Status: DC | PRN

## 2013-10-09 MED ORDER — NEPRO/CARBSTEADY PO LIQD
237.0000 mL | ORAL | Status: DC | PRN
  Filled 2013-10-09: qty 237

## 2013-10-09 MED ORDER — FENTANYL CITRATE 0.05 MG/ML IJ SOLN
INTRAMUSCULAR | Status: DC | PRN
  Administered 2013-10-09: 100 ug via INTRAVENOUS

## 2013-10-09 MED ORDER — CEFAZOLIN SODIUM-DEXTROSE 2-3 GM-% IV SOLR
2.0000 g | Freq: Once | INTRAVENOUS | Status: AC
  Administered 2013-10-09: 2 g via INTRAVENOUS
  Filled 2013-10-09: qty 50

## 2013-10-09 MED ORDER — PANTOPRAZOLE SODIUM 40 MG PO TBEC: 40.0000 mg | DELAYED_RELEASE_TABLET | Freq: Every day | ORAL | Status: DC

## 2013-10-09 MED ORDER — HYDRALAZINE HCL 20 MG/ML IJ SOLN: 10.0000 mg | INTRAMUSCULAR | Status: DC | PRN

## 2013-10-09 MED ORDER — PENTAFLUOROPROP-TETRAFLUOROETH EX AERO: 1.0000 "application " | INHALATION_SPRAY | CUTANEOUS | Status: DC | PRN

## 2013-10-09 MED ORDER — HYDROXYCHLOROQUINE SULFATE 200 MG PO TABS
200.0000 mg | ORAL_TABLET | Freq: Two times a day (BID) | ORAL | Status: DC
  Administered 2013-10-10: 200 mg via ORAL
  Filled 2013-10-09 (×3): qty 1

## 2013-10-09 MED ORDER — PROTAMINE SULFATE 10 MG/ML IV SOLN
INTRAVENOUS | Status: DC | PRN
  Administered 2013-10-09 (×3): 10 mg via INTRAVENOUS

## 2013-10-09 MED ORDER — LIDOCAINE-PRILOCAINE 2.5-2.5 % EX CREA
1.0000 "application " | TOPICAL_CREAM | CUTANEOUS | Status: DC | PRN
  Filled 2013-10-09: qty 5

## 2013-10-09 MED ORDER — METOPROLOL TARTRATE 1 MG/ML IV SOLN: 2.0000 mg | INTRAVENOUS | Status: DC | PRN

## 2013-10-09 MED ORDER — ONDANSETRON HCL 4 MG/2ML IJ SOLN
INTRAMUSCULAR | Status: DC | PRN
  Administered 2013-10-09: 4 mg via INTRAVENOUS

## 2013-10-09 MED ORDER — LIDOCAINE-EPINEPHRINE (PF) 1 %-1:200000 IJ SOLN
INTRAMUSCULAR | Status: AC
  Filled 2013-10-09: qty 10

## 2013-10-09 MED ORDER — PREDNISONE 10 MG PO TABS
10.0000 mg | ORAL_TABLET | Freq: Every day | ORAL | Status: DC
  Filled 2013-10-09 (×2): qty 1

## 2013-10-09 MED ORDER — PHENOL 1.4 % MT LIQD
1.0000 | OROMUCOSAL | Status: DC | PRN
  Filled 2013-10-09: qty 177

## 2013-10-09 MED ORDER — LISINOPRIL 40 MG PO TABS
40.0000 mg | ORAL_TABLET | Freq: Every day | ORAL | Status: DC
  Filled 2013-10-09 (×2): qty 1

## 2013-10-09 MED ORDER — HEPARIN SODIUM (PORCINE) 1000 UNIT/ML IJ SOLN
INTRAMUSCULAR | Status: DC | PRN
  Administered 2013-10-09: 10 mL

## 2013-10-09 MED ORDER — OXYCODONE HCL 5 MG PO TABS: 5.0000 mg | ORAL_TABLET | Freq: Four times a day (QID) | ORAL | Status: DC | PRN

## 2013-10-09 MED ORDER — 0.9 % SODIUM CHLORIDE (POUR BTL) OPTIME
TOPICAL | Status: DC | PRN
  Administered 2013-10-09: 1000 mL

## 2013-10-09 MED ORDER — PHENYLEPHRINE HCL 10 MG/ML IJ SOLN
10.0000 mg | INTRAVENOUS | Status: DC | PRN
  Administered 2013-10-09: 20 ug/min via INTRAVENOUS

## 2013-10-09 MED ORDER — FENTANYL CITRATE 0.05 MG/ML IJ SOLN: 50.0000 ug | Freq: Once | INTRAMUSCULAR | Status: DC

## 2013-10-09 MED ORDER — PROMETHAZINE HCL 25 MG/ML IJ SOLN: 6.2500 mg | INTRAMUSCULAR | Status: DC | PRN

## 2013-10-09 MED ORDER — ACETAMINOPHEN 325 MG PO TABS: 325.0000 mg | ORAL_TABLET | ORAL | Status: DC | PRN

## 2013-10-09 MED ORDER — MORPHINE SULFATE 2 MG/ML IJ SOLN: 2.0000 mg | INTRAMUSCULAR | Status: DC | PRN

## 2013-10-09 MED ORDER — SODIUM CHLORIDE 0.9 % IV SOLN: 100.0000 mL | INTRAVENOUS | Status: DC | PRN

## 2013-10-09 MED ORDER — OXYCODONE HCL 5 MG PO TABS: 5.0000 mg | ORAL_TABLET | ORAL | Status: DC | PRN

## 2013-10-09 MED ORDER — SODIUM CHLORIDE 0.9 % IR SOLN
Status: DC | PRN
  Administered 2013-10-09: 14:00:00

## 2013-10-09 MED ORDER — ACETAMINOPHEN 650 MG RE SUPP
325.0000 mg | RECTAL | Status: DC | PRN
Start: 2013-10-09 — End: 2013-10-10

## 2013-10-09 MED ORDER — HEPARIN SODIUM (PORCINE) 1000 UNIT/ML IJ SOLN
INTRAMUSCULAR | Status: AC
  Filled 2013-10-09: qty 1

## 2013-10-09 MED ORDER — PROPOFOL 10 MG/ML IV BOLUS
INTRAVENOUS | Status: DC | PRN
  Administered 2013-10-09: 150 mg via INTRAVENOUS

## 2013-10-09 MED ORDER — DARBEPOETIN ALFA-POLYSORBATE 25 MCG/0.42ML IJ SOLN
25.0000 ug | Freq: Once | INTRAMUSCULAR | Status: DC
  Filled 2013-10-09: qty 0.42

## 2013-10-09 MED ORDER — MIDAZOLAM HCL 2 MG/2ML IJ SOLN
1.0000 mg | INTRAMUSCULAR | Status: DC | PRN
Start: 2013-10-09 — End: 2013-10-10

## 2013-10-09 MED ORDER — ALTEPLASE 2 MG IJ SOLR
2.0000 mg | Freq: Once | INTRAMUSCULAR | Status: AC | PRN
  Filled 2013-10-09: qty 2

## 2013-10-09 MED ORDER — LIDOCAINE HCL (CARDIAC) 20 MG/ML IV SOLN
INTRAVENOUS | Status: DC | PRN
  Administered 2013-10-09: 80 mg via INTRAVENOUS

## 2013-10-09 MED ORDER — LABETALOL HCL 300 MG PO TABS
300.0000 mg | ORAL_TABLET | Freq: Two times a day (BID) | ORAL | Status: DC
  Administered 2013-10-10: 300 mg via ORAL
  Filled 2013-10-09 (×4): qty 1

## 2013-10-09 MED ORDER — ONDANSETRON HCL 4 MG/2ML IJ SOLN: 4.0000 mg | Freq: Four times a day (QID) | INTRAMUSCULAR | Status: DC | PRN

## 2013-10-09 MED ORDER — SODIUM CHLORIDE 0.9 % IV SOLN
INTRAVENOUS | Status: DC
  Administered 2013-10-09: 12:00:00 via INTRAVENOUS

## 2013-10-09 MED ORDER — OXYCODONE HCL 5 MG/5ML PO SOLN: 5.0000 mg | Freq: Once | ORAL | Status: DC | PRN

## 2013-10-09 MED ORDER — OXYCODONE HCL 5 MG PO TABS: 5.0000 mg | ORAL_TABLET | Freq: Once | ORAL | Status: DC | PRN

## 2013-10-09 MED ORDER — HYDROMORPHONE HCL PF 1 MG/ML IJ SOLN: 0.2500 mg | INTRAMUSCULAR | Status: DC | PRN

## 2013-10-09 MED ORDER — OXYCODONE-ACETAMINOPHEN 5-325 MG PO TABS
1.0000 | ORAL_TABLET | Freq: Once | ORAL | Status: AC
  Administered 2013-10-09: 1 via ORAL
  Filled 2013-10-09: qty 1

## 2013-10-09 MED ORDER — LABETALOL HCL 5 MG/ML IV SOLN
10.0000 mg | INTRAVENOUS | Status: DC | PRN
  Filled 2013-10-09: qty 4

## 2013-10-09 SURGICAL SUPPLY — 80 items
ADH SKN CLS APL DERMABOND .7 (GAUZE/BANDAGES/DRESSINGS) ×2
BAG BANDED W/RUBBER/TAPE 36X54 (MISCELLANEOUS) ×1 IMPLANT
BAG DECANTER FOR FLEXI CONT (MISCELLANEOUS) ×3 IMPLANT
BAG EQP BAND 135X91 W/RBR TAPE (MISCELLANEOUS) ×2
BANDAGE ELASTIC 4 VELCRO ST LF (GAUZE/BANDAGES/DRESSINGS) ×1 IMPLANT
BANDAGE ESMARK 6X9 LF (GAUZE/BANDAGES/DRESSINGS) IMPLANT
BANDAGE GAUZE ELAST BULKY 4 IN (GAUZE/BANDAGES/DRESSINGS) ×1 IMPLANT
BNDG CMPR 9X6 STRL LF SNTH (GAUZE/BANDAGES/DRESSINGS)
BNDG ESMARK 6X9 LF (GAUZE/BANDAGES/DRESSINGS)
CANISTER SUCTION 2500CC (MISCELLANEOUS) ×3 IMPLANT
CATH CANNON HEMO 15F 50CM (CATHETERS) IMPLANT
CATH CANNON HEMO 15FR 19 (HEMODIALYSIS SUPPLIES) ×1 IMPLANT
CATH CANNON HEMO 15FR 23CM (HEMODIALYSIS SUPPLIES) IMPLANT
CATH CANNON HEMO 15FR 31CM (HEMODIALYSIS SUPPLIES) IMPLANT
CATH CANNON HEMO 15FR 32 (HEMODIALYSIS SUPPLIES) IMPLANT
CATH CANNON HEMO 15FR 32CM (HEMODIALYSIS SUPPLIES) IMPLANT
CHLORAPREP W/TINT 26ML (MISCELLANEOUS) ×3 IMPLANT
COVER DOME SNAP 22 D (MISCELLANEOUS) ×1 IMPLANT
COVER PROBE W GEL 5X96 (DRAPES) ×1 IMPLANT
COVER SURGICAL LIGHT HANDLE (MISCELLANEOUS) ×3 IMPLANT
CUFF TOURNIQUET SINGLE 18IN (TOURNIQUET CUFF) IMPLANT
CUFF TOURNIQUET SINGLE 24IN (TOURNIQUET CUFF) IMPLANT
CUFF TOURNIQUET SINGLE 34IN LL (TOURNIQUET CUFF) IMPLANT
CUFF TOURNIQUET SINGLE 44IN (TOURNIQUET CUFF) IMPLANT
DERMABOND ADVANCED (GAUZE/BANDAGES/DRESSINGS) ×1
DERMABOND ADVANCED .7 DNX12 (GAUZE/BANDAGES/DRESSINGS) IMPLANT
DRAIN CHANNEL 15F RND FF W/TCR (WOUND CARE) IMPLANT
DRAPE C-ARM 42X72 X-RAY (DRAPES) ×2 IMPLANT
DRAPE CHEST BREAST 15X10 FENES (DRAPES) ×3 IMPLANT
DRSG COVADERM 4X10 (GAUZE/BANDAGES/DRESSINGS) IMPLANT
DRSG COVADERM 4X8 (GAUZE/BANDAGES/DRESSINGS) IMPLANT
ELECT REM PT RETURN 9FT ADLT (ELECTROSURGICAL) ×3
ELECTRODE REM PT RTRN 9FT ADLT (ELECTROSURGICAL) ×2 IMPLANT
EVACUATOR SILICONE 100CC (DRAIN) IMPLANT
GAUZE SPONGE 2X2 8PLY STRL LF (GAUZE/BANDAGES/DRESSINGS) ×2 IMPLANT
GAUZE SPONGE 4X4 16PLY XRAY LF (GAUZE/BANDAGES/DRESSINGS) ×2 IMPLANT
GLOVE BIO SURGEON STRL SZ 6.5 (GLOVE) ×2 IMPLANT
GLOVE BIO SURGEON STRL SZ7.5 (GLOVE) ×4 IMPLANT
GLOVE BIOGEL PI IND STRL 6.5 (GLOVE) IMPLANT
GLOVE BIOGEL PI IND STRL 8 (GLOVE) ×2 IMPLANT
GLOVE BIOGEL PI INDICATOR 6.5 (GLOVE) ×1
GLOVE BIOGEL PI INDICATOR 8 (GLOVE) ×3
GLOVE ECLIPSE 7.5 STRL STRAW (GLOVE) ×2 IMPLANT
GOWN STRL NON-REIN LRG LVL3 (GOWN DISPOSABLE) ×10 IMPLANT
GOWN STRL REIN XL XLG (GOWN DISPOSABLE) ×1 IMPLANT
KIT BASIN OR (CUSTOM PROCEDURE TRAY) ×3 IMPLANT
KIT ROOM TURNOVER OR (KITS) ×3 IMPLANT
NDL 18GX1X1/2 (RX/OR ONLY) (NEEDLE) ×2 IMPLANT
NDL HYPO 25GX1X1/2 BEV (NEEDLE) ×2 IMPLANT
NEEDLE 18GX1X1/2 (RX/OR ONLY) (NEEDLE) ×3 IMPLANT
NEEDLE 22X1 1/2 (OR ONLY) (NEEDLE) ×3 IMPLANT
NEEDLE HYPO 25GX1X1/2 BEV (NEEDLE) ×3 IMPLANT
NS IRRIG 1000ML POUR BTL (IV SOLUTION) ×5 IMPLANT
PACK CV ACCESS (CUSTOM PROCEDURE TRAY) ×1 IMPLANT
PACK PERIPHERAL VASCULAR (CUSTOM PROCEDURE TRAY) ×2 IMPLANT
PACK SURGICAL SETUP 50X90 (CUSTOM PROCEDURE TRAY) ×2 IMPLANT
PAD ARMBOARD 7.5X6 YLW CONV (MISCELLANEOUS) ×6 IMPLANT
PADDING CAST COTTON 6X4 STRL (CAST SUPPLIES) IMPLANT
SPONGE GAUZE 2X2 STER 10/PKG (GAUZE/BANDAGES/DRESSINGS) ×1
SPONGE GAUZE 4X4 12PLY (GAUZE/BANDAGES/DRESSINGS) ×1 IMPLANT
STAPLER VISISTAT 35W (STAPLE) IMPLANT
SUT ETHILON 3 0 PS 1 (SUTURE) ×3 IMPLANT
SUT PROLENE 5 0 C 1 24 (SUTURE) IMPLANT
SUT PROLENE 6 0 BV (SUTURE) ×2 IMPLANT
SUT VIC AB 2-0 CTB1 (SUTURE) ×2 IMPLANT
SUT VIC AB 3-0 SH 27 (SUTURE) ×3
SUT VIC AB 3-0 SH 27X BRD (SUTURE) ×2 IMPLANT
SUT VICRYL 4-0 PS2 18IN ABS (SUTURE) ×5 IMPLANT
SYR 20CC LL (SYRINGE) ×6 IMPLANT
SYR 30ML LL (SYRINGE) IMPLANT
SYR 5ML LL (SYRINGE) ×6 IMPLANT
SYR CONTROL 10ML LL (SYRINGE) ×3 IMPLANT
SYRINGE 10CC LL (SYRINGE) ×3 IMPLANT
TAPE CLOTH SURG 4X10 WHT LF (GAUZE/BANDAGES/DRESSINGS) ×1 IMPLANT
TOWEL OR 17X24 6PK STRL BLUE (TOWEL DISPOSABLE) ×6 IMPLANT
TOWEL OR 17X26 10 PK STRL BLUE (TOWEL DISPOSABLE) ×3 IMPLANT
TRAY FOLEY CATH 16FRSI W/METER (SET/KITS/TRAYS/PACK) ×2 IMPLANT
UNDERPAD 30X30 INCONTINENT (UNDERPADS AND DIAPERS) ×3 IMPLANT
WATER STERILE IRR 1000ML POUR (IV SOLUTION) ×3 IMPLANT
YANKAUER SUCT BULB TIP NO VENT (SUCTIONS) ×1 IMPLANT

## 2013-10-09 NOTE — ED Notes (Signed)
PA at bedside.

## 2013-10-09 NOTE — Anesthesia Procedure Notes (Signed)
Procedure Name: LMA Insertion Date/Time: 10/09/2013 12:51 PM Performed by: Luciana Axe K Pre-anesthesia Checklist: Patient identified, Emergency Drugs available, Suction available, Patient being monitored and Timeout performed Patient Re-evaluated:Patient Re-evaluated prior to inductionOxygen Delivery Method: Circle system utilized Preoxygenation: Pre-oxygenation with 100% oxygen Intubation Type: IV induction LMA: LMA inserted LMA Size: 4.0 Number of attempts: 1 Placement Confirmation: positive ETCO2,  CO2 detector and breath sounds checked- equal and bilateral Tube secured with: Tape Dental Injury: Teeth and Oropharynx as per pre-operative assessment

## 2013-10-09 NOTE — Progress Notes (Signed)
Pt belongings brought to PACU including brown bag, green bag and 2 clothing bags,glasses and cell phone

## 2013-10-09 NOTE — Anesthesia Preprocedure Evaluation (Signed)
Anesthesia Evaluation  Patient identified by MRN, date of birth, ID band Patient awake    Airway Mallampati: I TM Distance: >3 FB Neck ROM: Full    Dental   Pulmonary  breath sounds clear to auscultation        Cardiovascular hypertension, Rhythm:Regular Rate:Normal     Neuro/Psych    GI/Hepatic   Endo/Other    Renal/GU ESRFRenal disease     Musculoskeletal   Abdominal   Peds  Hematology  (+) anemia ,   Anesthesia Other Findings SLE  Reproductive/Obstetrics                           Anesthesia Physical Anesthesia Plan  ASA: III  Anesthesia Plan: General   Post-op Pain Management:    Induction: Intravenous  Airway Management Planned: LMA  Additional Equipment:   Intra-op Plan:   Post-operative Plan: Extubation in OR  Informed Consent: I have reviewed the patients History and Physical, chart, labs and discussed the procedure including the risks, benefits and alternatives for the proposed anesthesia with the patient or authorized representative who has indicated his/her understanding and acceptance.     Plan Discussed with: CRNA and Surgeon  Anesthesia Plan Comments:         Anesthesia Quick Evaluation

## 2013-10-09 NOTE — Transfer of Care (Signed)
Immediate Anesthesia Transfer of Care Note  Patient: [name]  Procedure(s) Performed: Procedure(s): EVACUATION HEMATOMA (Left) INSERTION OF DIALYSIS CATHETER; ULTRASOUND GUIDED (N/A)  Patient Location: PACU  Anesthesia Type:General  Level of Consciousness: awake, oriented and patient cooperative  Airway & Oxygen Therapy: Patient Spontanous Breathing and Patient connected to nasal cannula oxygen  Post-op Assessment: Report given to PACU RN and Post -op Vital signs reviewed and stable  Post vital signs: Reviewed  Complications: No apparent anesthesia complications

## 2013-10-09 NOTE — ED Provider Notes (Signed)
Medical screening examination/treatment/procedure(s) were conducted as a shared visit with non-physician practitioner(s) and myself.  I personally evaluated the patient during the encounter.  37yo F, Hx ESRD on HD, c/o left AV fistula hematoma that began while she was at HD approx 2 hours PTA. States the hematoma began when the HD staff attempted to access her HD site.  Pt c/o pain at the site. Denies external bleeding. VSS, A&O, resps easy, RRR, LUE AV fistula area firm and tender to palp, no active bleeding at site. T/C to Vasc Surgery Dr. Scot Dock, he has evaluated pt in the ED and will take her to the OR for hematoma evacuation.     Alfonzo Feller, DO 10/09/13 [phone]

## 2013-10-09 NOTE — ED Notes (Signed)
Dialysis catheter removed by collins VS PA. Pressure held to side by PA as well. Pt tolerated well

## 2013-10-09 NOTE — ED Provider Notes (Signed)
CSN: [id number]     Arrival date & time 10/09/13  0741 History   First MD Initiated Contact with Patient 10/09/13 0744     Chief Complaint  Patient presents with  . Vascular Access Problem   (Consider location/radiation/quality/duration/timing/severity/associated sxs/prior Treatment) HPI  37 year old female, Monday Wednesday Friday dialysis patient presents to the ER via EMS from dialysis center for evaluation of hematoma. Patient went to dialysis this morning and when they attempt to access the dialysis site a hematoma developed around the site.  Report gradual onset of sharp pain and swelling to affected area.  Incident happened 2 hours ago. Pain is getting progressively worse. Denies any numbness to the distal aspect of the arm. No complaints of fever chills, chest pain or shortness of breath. No rash. Patient had one other prior occurrence of IV infiltration with dialysis happened 3 weeks ago she was able to iced it at home with good resolution. Patient also has history of lupus.  Past Medical History  Diagnosis Date  . Renal disorder   . Hypertension   . Lupus   . Hyperlipemia   . Anemia    Past Surgical History  Procedure Laterality Date  . Av fistula placement Left    No family history on file. History  Substance Use Topics  . Smoking status: Never Smoker   . Smokeless tobacco: Not on file  . Alcohol Use: No   OB History   No data available     Review of Systems  Constitutional: Negative for fever.  Musculoskeletal: Positive for myalgias.  Neurological: Negative for headaches.    Allergies  Amlodipine; Sulfa antibiotics; and Vancomycin  Home Medications   Current Outpatient Rx  Name  Route  Sig  Dispense  Refill  . calcium acetate (PHOSLO) 667 MG capsule   Oral   Take 667 mg by mouth 2 (two) times daily with a meal.         . furosemide (LASIX) 80 MG tablet   Oral   Take 80 mg by mouth 2 (two) times daily.         . hydroxychloroquine (PLAQUENIL)  200 MG tablet   Oral   Take 200 mg by mouth 2 (two) times daily.         Marland Kitchen labetalol (NORMODYNE) 300 MG tablet   Oral   Take 300 mg by mouth 2 (two) times daily.         Marland Kitchen lisinopril (PRINIVIL,ZESTRIL) 40 MG tablet   Oral   Take 40 mg by mouth daily.         . predniSONE (DELTASONE) 10 MG tablet   Oral   Take 10 mg by mouth daily with breakfast.         . zolpidem (AMBIEN) 5 MG tablet   Oral   Take 5 mg by mouth at bedtime as needed for sleep.          There were no vitals taken for this visit. Physical Exam  Nursing note and vitals reviewed. Constitutional: She appears well-developed and well-nourished. She appears distressed (uncomfortable appearing holding her left upper arm).  HENT:  Head: Atraumatic.  Eyes: Conjunctivae are normal.  Neck: Neck supple.  Neurological: She is alert.  Skin: No rash noted.  A large size hematoma noted to L upper arm surrounding AV fistula, with moderate tenderness to affected area on palpation.  Arm is firm, palpable thrills and bruits to fistula.  Intact distal sensation and radial pulse 2+ to L arm.  ED Course  Procedures (including critical care time)  8:05 AM Dialysis pt with IV infiltration causing large hematoma to L upper arm at fistula site.  Arm is elevated, iced, pain medication given, will consult vascular surgeon for recommendation.    8:07 AM Vascular surgeon Dr. Scot Dock agrees to see pt in ED for further management.    8:17 AM Dr. Scot Dock has seen and evaluate pt, and plan to bring pt to OR to evacuate hematoma.  Will check basic labs and continue pain management.  Pt made NPO.    Labs Review Labs Reviewed  POCT I-STAT, CHEM 8 - Abnormal; Notable for the following:    BUN 51 (*)    Creatinine, Ser 8.30 (*)    Glucose, Bld 67 (*)    All other components within normal limits  CBC WITH DIFFERENTIAL   Imaging Review No results found.  EKG Interpretation   None       MDM   1. Complication of AV  dialysis fistula, initial encounter   2. Hematoma    BP 165/109  Pulse 80  Temp(Src) 98.3 F (36.8 C) (Oral)  Resp 22  Ht 5\' 4"  (1.626 m)  Wt 120 lb (54.432 kg)  BMI 20.59 kg/m2  SpO2 100%  LMP 09/20/2013     Domenic Moras, PA-C 10/09/13 [phone]

## 2013-10-09 NOTE — ED Notes (Signed)
Pt undressed, in gown, on continuous pulse oximetry and blood pressure cuff 

## 2013-10-09 NOTE — ED Notes (Signed)
Pt totally undressed; Vascular PA at bedside at this time speaking with pt

## 2013-10-09 NOTE — Preoperative (Signed)
Beta Blockers   Reason not to administer Beta Blockers:Not Applicable 

## 2013-10-09 NOTE — H&P (Signed)
CONSULT NOTE   MRN : [id number]  Reason for Consult: Left upper arm AV graft hematoma  Referring Physician: ED  History of Present Illness: 37 y.o. Female was in dialysis this am and once the needle was placed on the lateral graft side a hematoma erupted.  She was brought to the ED via ambulance with the needle and stop syringe in place.  She last ate at 4:30 this am.    Her medical history includes hypertension and lupus which caused her CKD.   She takes an ACI for her hypertension.  She denise hyperlipidemia.  She has been on dialysis for 1 year.   No current facility-administered medications for this encounter.   Current Outpatient Prescriptions  Medication Sig Dispense Refill  . calcium acetate (PHOSLO) 667 MG capsule Take 667 mg by mouth 2 (two) times daily with a meal.      . furosemide (LASIX) 80 MG tablet Take 80 mg by mouth 2 (two) times daily.      . hydroxychloroquine (PLAQUENIL) 200 MG tablet Take 200 mg by mouth 2 (two) times daily.      Marland Kitchen labetalol (NORMODYNE) 300 MG tablet Take 300 mg by mouth 2 (two) times daily.      Marland Kitchen lisinopril (PRINIVIL,ZESTRIL) 40 MG tablet Take 40 mg by mouth daily.      . predniSONE (DELTASONE) 10 MG tablet Take 10 mg by mouth daily with breakfast.      . zolpidem (AMBIEN) 5 MG tablet Take 5 mg by mouth at bedtime as needed for sleep.       Pt meds include: Statin :No Betablocker: No ASA: No Other anticoagulants/antiplatelets:   Past Medical History  Diagnosis Date  . Renal disorder   . Hypertension   . Lupus   . Hyperlipemia   . Anemia    Past Surgical History  Procedure Laterality Date  . Av fistula placement Left    Social History History  Substance Use Topics  . Smoking status: Never Smoker   . Smokeless tobacco: Not on file  . Alcohol Use: No   Family History Mother CAD, HTN Father healthy  Allergies  Allergen Reactions  . Amlodipine     Swelling  . Sulfa Antibiotics   . Vancomycin    REVIEW OF  SYSTEMS  General: [ ]  Weight loss, [ ]  Fever, [ ]  chills Neurologic: [ ]  Dizziness, [ ]  Blackouts, [ ]  Seizure [ ]  Stroke, [ ]  "Mini stroke", [ ]  Slurred speech, [ ]  Temporary blindness; [ ]  weakness in arms or legs, [ ]  Hoarseness [ ]  Dysphagia Cardiac: [ ]  Chest pain/pressure, [ ]  Shortness of breath at rest [ ]  Shortness of breath with exertion, [ ]  Atrial fibrillation or irregular heartbeat  Vascular: [ ]  Pain in legs with walking, [ ]  Pain in legs at rest, [ ]  Pain in legs at night,  [ ]  Non-healing ulcer, [ ]  Blood clot in vein/DVT,   Pulmonary: [ ]  Home oxygen, [ ]  Productive cough, [ ]  Coughing up blood, [ ]  Asthma,  [ ]  Wheezing [ ]  COPD Musculoskeletal:  [ ]  Arthritis, [ ]  Low back pain, [ ]  Joint pain Hematologic: [ ]  Easy Bruising, [ ]  Anemia; [ ]  Hepatitis Gastrointestinal: [ ]  Blood in stool, [ ]  Gastroesophageal Reflux/heartburn, Urinary: [x ] chronic Kidney disease, [ ]  on HD - [x ] MWF or [ ]  TTHS, [ ]  Burning with urination, [ ]  Difficulty urinating Skin: [ ]  Rashes, [ ]  Wounds Psychological: [ ]   Anxiety, [ ]  Depression  Physical Examination Filed Vitals:   10/09/13 0747 10/09/13 0751 10/09/13 0800  BP:  172/108 165/109  Pulse:  85 80  Temp:  98.3 F (36.8 C)   TempSrc:  Oral   Resp:  22   Height: 5\' 4"  (1.626 m)    Weight: 120 lb (54.432 kg)    SpO2:  100% 100%   Body mass index is 20.59 kg/(m^2).  General:  Acute distress secondary to pain in the left upper arm HENT: WNL Eyes: Pupils equal Pulmonary: normal non-labored breathing , without Rales, rhonchi,  wheezing Cardiac: RRR, without  Murmurs, rubs or gallops; No carotid bruits Abdomen: soft, NT, no masses Skin: no rashes, ulcers noted;  no Gangrene , no cellulitis; no open wounds;   Vascular Exam/Pulses:Left hand cool touch, palpable radial.  No skin discoloration.  Positive large hematoma.  Pressure held about 15 min. Total.  Clean dressing in place.  Musculoskeletal: no muscle wasting or atrophy; no  edema  Neurologic: A&O X 3; Appropriate Affect ;  SENSATION: normal; MOTOR FUNCTION: 5/5 Symmetric Speech is fluent/normal  Significant Diagnostic Studies: CBC Lab Results  Component Value Date   WBC 21.1* 06/25/2011   HGB 12.6 10/09/2013   HCT 37.0 10/09/2013   MCV 91.9 06/25/2011   PLT 139* 06/25/2011   BMET    Component Value Date/Time   NA 135 10/09/2013 0845   K 3.7 10/09/2013 0845   CL 98 10/09/2013 0845   CO2 23 06/23/2011 0804   GLUCOSE 67* 10/09/2013 0845   BUN 51* 10/09/2013 0845   CREATININE 8.30* 10/09/2013 0845   CALCIUM 9.3 06/23/2011 0804   CALCIUM 8.5 06/21/2011 0622   GFRNONAA 8* 06/23/2011 0804   GFRAA 9* 06/23/2011 0804   Estimated Creatinine Clearance: 8 ml/min (by C-G formula based on Cr of 8.3).  COAG Lab Results  Component Value Date   INR 1.02 06/14/2011   INR 0.9 09/01/2008   ASSESSMENT/PLAN:  Left AV Graft needle infiltration with large hematoma. Plan OR for exploration and evacuation of hematoma by Dr. Scot Dock.    Laurence Slate [facility] 10/09/2013 8:48 AM  Agree with above. Large infiltrate which will require evacuation. The overlieing skin in tight. Will need TDC for dialysis until swelling an ecchymosis resolve in left upper arm.  [name], MD, [place] [phone] 10/09/2013

## 2013-10-09 NOTE — ED Notes (Signed)
Maurine Collins vascular PA was called regarding whether or not pt would need a bed request. States that she will go to OR from ED. Pager number [phone]

## 2013-10-09 NOTE — Op Note (Signed)
   NAME: [name]    MRN: [id number] DOB: Mar 31, 1976    DATE OF OPERATION: 10/09/2013  PREOP DIAGNOSIS: infiltrate left upper arm AV graft  POSTOP DIAGNOSIS: same  PROCEDURE:  1. Ultrasound-guided placement of right IJ tunneled dialysis catheter (19 cm) 2. Evacuation of hematoma left upper arm  SURGEON: Judeth Cornfield. Scot Dock, MD, FACS  ASSIST: Liana Crocker PA  ANESTHESIA: Gen.   EBL: minimal  INDICATIONS: [name] is a 37 y.o. female he developed a large infiltrate during dialysis this morning. She was urgently to the emergency department at [facility]. She is brought in for evacuation of hematoma and placement of a catheter until the graft can be used again.  FINDINGS: Large infiltrate left upper arm.  TECHNIQUE: The patient was taken to the operating room and received a general anesthetic. The neck and upper chest were prepped and draped in usual sterile fashion. Under ultrasound guidance, the right IJ was cannulated and a guidewire introduced into the superior vena cava under fluoroscopic control. The tract of the wire was dilated and the dilator and peel-away sheath were advanced over the wire. The wire and dilator were removed. A 19 cm catheter was passed through the peel-away sheath and positioned in the right atrium. The exit site the cath was selected and the catheter was brought through the tunnel. The catheter was cut to the appropriate length and the distal ports were attached. Both ports withdrew easily and then flushed with heparinized saline and filled with concentrated heparin. Catheter was secured at its exit site with a 3-0 nylon suture. The IJ cannulation site was closed with a 4-0 subcuticular stitch. Sterile dressing was applied.  Attention was then turned the left arm. A large hematoma in the left arm appeared pulsatile. I therefore elected to get proximal distal control by dissecting out the graft in the axilla. A longitudinal incision was made in the axilla  and both the arterial and venous limbs of the graft were dissected free. The patient was then heparinized. The graft was clamped. The large hematoma was entered and a large amount of blood was evacuated. There was no active bleeding site released the clamps on the graft. Heparin was partially reversed with protamine. Once all the hematoma had been evacuated hemostasis was obtained in the wounds. Each of the wounds was closed with a deep layer 3-0 Vicryl and skin closed with 4-0 Vicryl. Patient tolerated the procedure well and was transferred to the recovery room in stable condition. All needle and sponge counts were correct.  [name], MD, FACS [facility]  DATE OF DICTATION:   10/09/2013

## 2013-10-09 NOTE — ED Notes (Signed)
Per EMS- Pt went to dialysis this morning and they attempted to access her dialysis site and a hematoma developed around the site. Estimates 200 cc blood around site is swollen. C/o pain to that site on her left arm. Sensation is intact. Pt is a x 4. BP 160/110, HR 90, RR 22, 100% RA.

## 2013-10-09 NOTE — Consult Note (Signed)
[facility] Renal Consultation Note    Indication for Consultation:  Management of ESRD/hemodialysis; anemia, hypertension/volume and secondary hyperparathyroidism  HPI: [name] is a 37 y.o. female with ESRD secondary to lupus  on MWF dialysis at [facility] dialysis center who had acute pain and subsequent swelling of dialysis access arm after cannulation starting about 7 am today.  She said she didn't received any dialysis.  She was sent emergently to ED for evaluation due to increasing size and subsequently taken to the OR for evaluation by Dr. Scot Dock with evacuation of the hematoma and placement of a temporary tunneled dialysis catheter. Her previous dialysis 11/19 had been uneventful.  Her most recent access flows were within usual range with good kinetics.  She lives alone, has no local family and takes SCAT to dialysis so does not have transportation tomorrow.  She has no CP, CP, fever chills, N, V or D.  She is nervous about going home.  Past Medical History  Diagnosis Date  . Hypertension   . Lupus   . Hyperlipemia   . Anemia   . Secondary hyperparathyroidism (of renal origin)   . 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 Av fistula placement Left   . Incision and drainage abscess      gluteal  . Tibiocalcaneal fusion  left     Family Hx; mother died MI; father unknown Social History:  reports that she has never smoked. She does not have any smokeless tobacco history on file. She reports that she does not drink alcohol or use illicit drugs. Allergies  Allergen Reactions  . Amlodipine     Swelling  . Sulfa Antibiotics   . Vancomycin    Prior to Admission medications   Medication Sig Start Date End Date Taking? Authorizing Provider   calcium acetate (PHOSLO) 667 MG capsule Take 667 mg by mouth 2 (two) times daily with a meal.   Yes Historical Provider, MD  furosemide (LASIX) 80 MG tablet Take 80 mg by mouth 2 (two) times daily.   Yes Historical Provider, MD  hydroxychloroquine (PLAQUENIL) 200 MG tablet Take 200 mg by mouth 2 (two) times daily.   Yes Historical Provider, MD  labetalol (NORMODYNE) 300 MG tablet Take 300 mg by mouth 2 (two) times daily.   Yes Historical Provider, MD  lisinopril (PRINIVIL,ZESTRIL) 40 MG tablet Take 40 mg by mouth daily.   Yes Historical Provider, MD  predniSONE (DELTASONE) 10 MG tablet Take 10 mg by mouth daily with breakfast.   Yes Historical Provider, MD  zolpidem (AMBIEN) 5 MG tablet Take 5 mg by mouth at bedtime as needed for sleep.   Yes Historical Provider, MD  oxyCODONE (ROXICODONE) 5 MG immediate release tablet Take 1 tablet (5 mg total) by mouth every 6 (six) hours as needed for severe pain. 10/09/13   Gabriel Earing, PA-C   Current Facility-Administered Medications  Medication Dose Route Frequency Provider Last Rate Last Dose  . 0.9 %  sodium chloride infusion   Intravenous Continuous Angelia Mould, MD 20 mL/hr at 10/09/13 1228    . HYDROmorphone (DILAUDID) injection 0.25-0.5 mg  0.25-0.5 mg Intravenous Q5 min PRN Leda Quail, MD      . oxyCODONE (Oxy IR/ROXICODONE) immediate release  tablet 5 mg  5 mg Oral Once PRN Leda Quail, MD       Or  . oxyCODONE (ROXICODONE) 5 MG/5ML solution 5 mg  5 mg Oral Once PRN Leda Quail, MD      . promethazine (PHENERGAN) injection 6.25-12.5 mg  6.25-12.5 mg Intravenous Q15 min PRN Leda Quail, MD       Labs: Basic Metabolic Panel:  Recent Labs Lab 10/09/13 0845  NA 135  K 3.7  CL 98  GLUCOSE 67*  BUN 51*  CREATININE 8.30*   LCBC:  Recent Labs Lab 10/09/13 0830 10/09/13 0845  WBC 16.3*  --   NEUTROABS 14.9*  --   HGB 10.7* 12.6  HCT 31.3* 37.0  MCV 90.7  --   PLT 210  --    ROS: as per HPI otherwise neg  Physical  Exam: Filed Vitals:   10/09/13 0751 10/09/13 0800 10/09/13 1000 10/09/13 1430  BP: 172/108 165/109 124/84 139/92  Pulse: 85 80 68   Temp: 98.3 F (36.8 C)   97.5 F (36.4 C)  TempSrc: Oral     Resp: 22     Height:      Weight:      SpO2: 100% 100% 93%      General: Well developed, well nourished, in PACU Head: Normocephalic, exothalmic, atraumatic, sclera non-icteric, mucus membranes are moist Neck: Supple. JVD not elevated. Lungs: Clear bilaterally to auscultation without wheezes, rales,  Heart: RRR  Abdomen: Soft, non-tender, non-distended + BS. M-S:  Strength and tone appear normal for age. Lower extremities: without edema   Neuro: Alert and oriented X 3. Moves all extremities spontaneously. Psych:  Responds to questions appropriately with a normal affect. Dialysis Access: right I-J left upper AVGG wrapped + bruit  Dialysis Orders: MWF AF 3.5 hours Optiflux 180 400/A 1.4 EDW 53 2 K 2.25 Ca left upper AVGG Epo 2K hectorol 1 heparin 1800  Assessment/Plan: 1. Infiltration of left upper AVGG s/p evacuation of hematoma and placement of right I-J TDC; given lack of home support and having had anesthesia plus transportation issues tomorrow, favor keeping overnight and dialyzing first thing in am and d/c home. 2. ESRD -  MWF - defer HD to Saturday; no heparin HD Saturday 3. Hypertension/volume  - generally good control with fluid gains about 2 L 4. Anemia  - repeat Hgb; expect to drop due to large hematoma; pre surgery Hgb 10.7; dose Epo 25 K 5. Metabolic bone disease -  Continue low dose hectorol 6. Nutrition - needs renal diet 7. SLE - on chronic prednisone  Myriam Jacobson, PA-C [facility] [phone] 10/09/2013, 3:24 PM   I have seen and examined patient, discussed with PA and agree with assessment and plan as outlined above.  ESRD patient with lupus, HTN presented after avg infiltration at HD today.  Has already been to OR and surgeon's placed a tunneled  cath and evacuated large amt of hematoma from the graft arm.  The graft will be rested and the catheter used for HD for the time being.  Plan HD in am.  Rec's as above.  Kelly Splinter MD pager [phone]    cell [phone] 10/09/2013, 4:22 PM

## 2013-10-09 NOTE — Telephone Encounter (Addendum)
Message copied by Gena Fray on Fri Oct 09, 2013  3:39 PM ------      Message from: Alfonso Patten      Created: Fri Oct 09, 2013  3:01 PM      Regarding: FW: charge and f/u                   ----- Message -----         From: Angelia Mould, MD         Sent: 10/09/2013   2:30 PM           To: Patrici Ranks, Alfonso Patten, RN, #      Subject: charge and f/u                                           PROCEDURE:       1. Ultrasound-guided placement of right IJ tunneled dialysis catheter (19 cm)      2. Evacuation of hematoma left upper arm            SURGEON: Judeth Cornfield. Scot Dock, MD, FACS            ASSIST: Liana Crocker PA            She will need a follow up visit in 3 weeks to check on her left upper arm AV graft. Thank you. CD ------  10/09/13: no answer, no machine, mailed letter- pt is still admitted, lm with floor RN, dpm

## 2013-10-09 NOTE — Anesthesia Postprocedure Evaluation (Signed)
  Anesthesia Post-op Note  Patient: [name]  Procedure(s) Performed: Procedure(s): EVACUATION HEMATOMA (Left) INSERTION OF DIALYSIS CATHETER; ULTRASOUND GUIDED (N/A)  Patient Location: PACU  Anesthesia Type:General  Level of Consciousness: awake  Airway and Oxygen Therapy: Patient Spontanous Breathing  Post-op Pain: mild  Post-op Assessment: Post-op Vital signs reviewed, Patient's Cardiovascular Status Stable, Respiratory Function Stable, Patent Airway, No signs of Nausea or vomiting and Pain level controlled  Post-op Vital Signs: Reviewed and stable  Complications: No apparent anesthesia complications

## 2013-10-09 NOTE — ED Notes (Addendum)
Pt has increasing swelling to left upper arm at graft site. Pt c/o cold finger tips and 10/10 pain in arm. MD notified. Skin noted to be tight, intact and hard.

## 2013-10-10 NOTE — Progress Notes (Signed)
Pt educated and provided with d/c instruction and Rx. There are no questions at this time. Pt is waiting on ride to arrive.  Pt resting in bed, call light in reach.   Nolon Nations, RN

## 2013-10-10 NOTE — ED Provider Notes (Signed)
Medical screening examination/treatment/procedure(s) were conducted as a shared visit with non-physician practitioner(s) and myself.  I personally evaluated the patient during the encounter. Please see my previous note.    Alfonzo Feller, DO 10/10/13 1044

## 2013-10-10 NOTE — Progress Notes (Signed)
Patient IV D/c'd. Patient taken off the cardiac monitor. Discharged home with family. Patient is to have dialysis at her outpatient clinic at 12:00pm. Roxan Hockey

## 2013-10-12 NOTE — Discharge Summary (Signed)
Agree with plans for D/C.  [name], MD, [place] [phone] 10/12/2013

## 2013-10-12 NOTE — Discharge Summary (Signed)
[facility] Discharge Summary   Patient ID:  [name] MRN: [id number] DOB/AGE: 1976-01-06 37 y.o.  Admit date: 10/09/2013 Discharge date: 10/12/2013 Date of Surgery: 10/09/2013 Surgeon: Surgeon(s): Angelia Mould, MD  Admission Diagnosis: Hematoma Q000111Q Complication of AV dialysis fistula, initial encounter [996.73]  Discharge Diagnoses:  Hematoma Q000111Q Complication of AV dialysis fistula, initial encounter [996.73]  Secondary Diagnoses: Past Medical History  Diagnosis Date  . Hypertension   . Lupus   . Hyperlipemia   . Anemia   . Secondary hyperparathyroidism (of renal origin)   . 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 Avascular necrosis of bone     left tibial talus due to chronic prednisone use    Procedure(s): Ultrasound-guided placement of right IJ tunneled dialysis catheter (19 cm)  2. Evacuation of hematoma left upper arm  Discharged Condition: good  HPI:  [name] is a 37 y.o. female Who was in dialysis this am and once the needle was placed on the lateral graft side a hematoma erupted. She was brought to the ED via ambulance with the needle and stop syringe in place. She last ate at 4:30 this am.  Her medical history includes hypertension and lupus which caused her CKD. She takes an ACI for her hypertension. She denise hyperlipidemia. She has been on dialysis for 1 year.  She is admitted for evacuation of hematoma and new HD catheter.  Hospital Course:  [name] is a 37 y.o. female is S/P Left upper arm  EVACUATION HEMATOMA INSERTION OF DIALYSIS CATHETER; ULTRASOUND GUIDED Physical exam: left arm soft. Stable with good perfusion to hand Post-op wounds healing well Pt. Ambulating, voiding and taking PO diet without difficulty. Pt pain controlled with PO pain meds. Labs as below Complications:none  Consults:   Treatment Team:  Angelia Mould, MD Sol Blazing, MD  Significant Diagnostic Studies: CBC Lab Results  Component Value Date   WBC 16.3* 10/09/2013   HGB 12.6 10/09/2013   HCT 37.0 10/09/2013   MCV 90.7 10/09/2013   PLT 210 10/09/2013    BMET    Component Value Date/Time   NA 135 10/09/2013 0845   K 3.7 10/09/2013 0845   CL 98 10/09/2013 0845   CO2 23 06/23/2011 0804   GLUCOSE 67* 10/09/2013 0845   BUN 51* 10/09/2013 0845   CREATININE 8.30* 10/09/2013 0845   CALCIUM 9.3 06/23/2011 0804   CALCIUM 8.5 06/21/2011 0622   GFRNONAA 8* 06/23/2011 0804   GFRAA 9* 06/23/2011 0804   COAG Lab Results  Component Value Date   INR 1.02 06/14/2011   INR 0.9 09/01/2008     Disposition:  Discharge to :Home Discharge Orders   Future Appointments Provider Department Dept Phone   11/04/2013 2:15 PM Angelia Mould, MD [facility] [place] [phone]   Future Orders Complete By Expires   Call MD for:  redness, tenderness, or signs of infection (pain, swelling, bleeding, redness, odor or green/yellow discharge around incision site)  As directed    Call MD for:  severe or increased pain, loss or decreased feeling  in affected limb(s)  As directed    Call MD for:  temperature >100.5  As directed    Discharge wound care:  As directed    Comments:     Remove bandage in 48 hours.  May wash over wound with soap and water.   Driving  Restrictions  As directed    Comments:     No driving for 24 hours and while taking pain medication.   Lifting restrictions  As directed    Comments:     No heavy lifting for 4 weeks   Resume previous diet  As directed        Medication List         calcium acetate 667 MG capsule  Commonly known as:  PHOSLO  Take 667 mg by mouth 2 (two) times daily with a meal.     furosemide 80 MG tablet  Commonly known as:  LASIX  Take 80 mg by mouth 2 (two) times daily.     hydroxychloroquine 200 MG tablet  Commonly known as:   PLAQUENIL  Take 200 mg by mouth 2 (two) times daily.     labetalol 300 MG tablet  Commonly known as:  NORMODYNE  Take 300 mg by mouth 2 (two) times daily.     lisinopril 40 MG tablet  Commonly known as:  PRINIVIL,ZESTRIL  Take 40 mg by mouth daily.     oxyCODONE 5 MG immediate release tablet  Commonly known as:  ROXICODONE  Take 1 tablet (5 mg total) by mouth every 6 (six) hours as needed for severe pain.     predniSONE 10 MG tablet  Commonly known as:  DELTASONE  Take 10 mg by mouth daily with breakfast.     zolpidem 5 MG tablet  Commonly known as:  AMBIEN  Take 5 mg by mouth at bedtime as needed for sleep.       Verbal and written Discharge instructions given to the patient. Wound care per Discharge AVS     Follow-up Information   Follow up with DICKSON,CHRISTOPHER S, MD In 3 weeks. (Office will call you to arrange your appt (sent))    Specialty:  Vascular Surgery   Contact information:   [address] [place] [address] [phone]       Signed: Richrd Prime 10/12/2013, 7:28 AM

## 2013-10-13 ENCOUNTER — Encounter (HOSPITAL_COMMUNITY): Payer: Self-pay | Admitting: Vascular Surgery

## 2013-10-18 DIAGNOSIS — N186 End stage renal disease: Secondary | ICD-10-CM | POA: Diagnosis not present

## 2013-10-19 DIAGNOSIS — N2581 Secondary hyperparathyroidism of renal origin: Secondary | ICD-10-CM | POA: Diagnosis not present

## 2013-10-19 DIAGNOSIS — N186 End stage renal disease: Secondary | ICD-10-CM | POA: Diagnosis not present

## 2013-10-19 DIAGNOSIS — R7881 Bacteremia: Secondary | ICD-10-CM | POA: Diagnosis not present

## 2013-10-19 DIAGNOSIS — D631 Anemia in chronic kidney disease: Secondary | ICD-10-CM | POA: Diagnosis not present

## 2013-11-03 ENCOUNTER — Encounter: Payer: Self-pay | Admitting: Vascular Surgery

## 2013-11-04 ENCOUNTER — Encounter: Payer: Self-pay | Admitting: Vascular Surgery

## 2013-11-10 ENCOUNTER — Encounter: Payer: Self-pay | Admitting: Vascular Surgery

## 2013-11-11 ENCOUNTER — Encounter (INDEPENDENT_AMBULATORY_CARE_PROVIDER_SITE_OTHER): Payer: Self-pay

## 2013-11-11 ENCOUNTER — Ambulatory Visit (INDEPENDENT_AMBULATORY_CARE_PROVIDER_SITE_OTHER): Payer: Self-pay | Admitting: Vascular Surgery

## 2013-11-11 ENCOUNTER — Encounter: Payer: Self-pay | Admitting: Vascular Surgery

## 2013-11-11 VITALS — BP 163/107 | HR 75 | Resp 16 | Ht 64.0 in | Wt 124.0 lb

## 2013-11-11 DIAGNOSIS — N186 End stage renal disease: Secondary | ICD-10-CM | POA: Insufficient documentation

## 2013-11-11 NOTE — Progress Notes (Signed)
   Patient name: [name] MRN: [id number] DOB: 06-Oct-1976 Sex: female  REASON FOR VISIT: Follow up of infiltrate left upper arm AV graft.  HPI: [name] is a 37 y.o. female who developed a large infiltrate during dialysis on 10/09/2013. I took her to the operating room and evacuated the hematoma in her left upper arm and placed a right IJ tunneled dialysis catheter. She comes in for a follow up visit. She has been using her dialysis catheter for dialysis. She has no complaints referrable to her left arm.  REVIEW OF SYSTEMS: Valu.Nieves ] denotes positive finding; [  ] denotes negative finding  CARDIOVASCULAR:  [ ]  chest pain   [ ]  dyspnea on exertion    CONSTITUTIONAL:  [ ]  fever   [ ]  chills  PHYSICAL EXAM: Filed Vitals:   11/11/13 1544  BP: 163/107  Pulse: 75  Resp: 16  Height: 5\' 4"  (1.626 m)  Weight: 124 lb (56.246 kg)  SpO2: 100%   Body mass index is 21.27 kg/(m^2). GENERAL: The patient is a well-nourished female, in no acute distress. The vital signs are documented above. CARDIOVASCULAR: There is a regular rate and rhythm. PULMONARY: There is good air exchange bilaterally without wheezing or rales. Her left upper arm loop graft has an excellent bruit and thrill. There is no swelling.  MEDICAL ISSUES: At this point I think it is safe to begin using her left upper arm AV graft. What she has had 2 successful dialysis treatments I think arrangements can be made to remove her catheter. I will see her back as needed.  [place] [facility] [place] Beeper: [phone]

## 2013-11-18 DIAGNOSIS — N186 End stage renal disease: Secondary | ICD-10-CM | POA: Diagnosis not present

## 2013-11-20 DIAGNOSIS — D631 Anemia in chronic kidney disease: Secondary | ICD-10-CM | POA: Diagnosis not present

## 2013-11-20 DIAGNOSIS — N039 Chronic nephritic syndrome with unspecified morphologic changes: Secondary | ICD-10-CM | POA: Diagnosis not present

## 2013-11-20 DIAGNOSIS — N186 End stage renal disease: Secondary | ICD-10-CM | POA: Diagnosis not present

## 2013-11-20 DIAGNOSIS — N2581 Secondary hyperparathyroidism of renal origin: Secondary | ICD-10-CM | POA: Diagnosis not present

## 2013-11-24 DIAGNOSIS — Z452 Encounter for adjustment and management of vascular access device: Secondary | ICD-10-CM | POA: Diagnosis not present

## 2013-11-24 DIAGNOSIS — N186 End stage renal disease: Secondary | ICD-10-CM | POA: Diagnosis not present

## 2013-12-16 DIAGNOSIS — N186 End stage renal disease: Secondary | ICD-10-CM | POA: Diagnosis not present

## 2013-12-19 DIAGNOSIS — N186 End stage renal disease: Secondary | ICD-10-CM | POA: Diagnosis not present

## 2013-12-21 DIAGNOSIS — N2581 Secondary hyperparathyroidism of renal origin: Secondary | ICD-10-CM | POA: Diagnosis not present

## 2013-12-21 DIAGNOSIS — D631 Anemia in chronic kidney disease: Secondary | ICD-10-CM | POA: Diagnosis not present

## 2013-12-21 DIAGNOSIS — N186 End stage renal disease: Secondary | ICD-10-CM | POA: Diagnosis not present

## 2013-12-29 ENCOUNTER — Encounter: Payer: Self-pay | Admitting: Vascular Surgery

## 2013-12-29 ENCOUNTER — Other Ambulatory Visit: Payer: Self-pay | Admitting: *Deleted

## 2013-12-29 DIAGNOSIS — T82598A Other mechanical complication of other cardiac and vascular devices and implants, initial encounter: Secondary | ICD-10-CM

## 2013-12-30 ENCOUNTER — Ambulatory Visit: Payer: Self-pay | Admitting: Vascular Surgery

## 2013-12-30 ENCOUNTER — Other Ambulatory Visit (HOSPITAL_COMMUNITY): Payer: Self-pay

## 2013-12-30 ENCOUNTER — Encounter: Payer: Self-pay | Admitting: Vascular Surgery

## 2013-12-30 ENCOUNTER — Ambulatory Visit (INDEPENDENT_AMBULATORY_CARE_PROVIDER_SITE_OTHER): Payer: Self-pay | Admitting: Vascular Surgery

## 2013-12-30 ENCOUNTER — Other Ambulatory Visit: Payer: Self-pay | Admitting: *Deleted

## 2013-12-30 ENCOUNTER — Ambulatory Visit (HOSPITAL_COMMUNITY)
Admission: RE | Admit: 2013-12-30 | Discharge: 2013-12-30 | Disposition: A | Discharge status: Home / Self Care | Payer: Medicare Other | Source: Ambulatory Visit | Attending: Vascular Surgery | Admitting: Vascular Surgery

## 2013-12-30 VITALS — BP 144/107 | HR 80 | Ht 64.0 in | Wt 120.0 lb

## 2013-12-30 DIAGNOSIS — N186 End stage renal disease: Secondary | ICD-10-CM | POA: Diagnosis not present

## 2013-12-30 DIAGNOSIS — Z992 Dependence on renal dialysis: Secondary | ICD-10-CM | POA: Insufficient documentation

## 2013-12-30 DIAGNOSIS — T82598A Other mechanical complication of other cardiac and vascular devices and implants, initial encounter: Secondary | ICD-10-CM | POA: Diagnosis not present

## 2013-12-30 NOTE — Progress Notes (Signed)
[facility] of [facility]  Patient name: [name] MRN: [id number] DOB: Feb 28, 1976 Sex: female  REASON FOR VISIT: Poor flows in AV fistula and increased problems with bleeding.  HPI: [name] is a 38 y.o. female who had presented on 10/09/2013 with a large infiltrate in her left upper arm after dialysis. She underwent evacuation of the hematoma in her left upper arm and placement of a tunneled dialysis catheter. I last saw her on 11/11/2013. At that point, I thought that it would be reasonable to begin using her left upper arm AV graft.  She has been using her catheter for dialysis. Her tunneled dialysis catheter was removed. However, she was sent back today because the dialysis center reports decreased access flow and also prolonged bleeding times. She has no specific complaints referrable to her left upper extremity.  Past Medical History  Diagnosis Date  . Hypertension   . Lupus   . Hyperlipemia   . Anemia   . Secondary hyperparathyroidism (of renal origin)   . 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 Avascular necrosis of bone     left tibial talus due to chronic prednisone use   Family History  Problem Relation Age of Onset  . Asthma Sister   . Hypertension Mother   . Heart attack Mother    SOCIAL HISTORY: History  Substance Use Topics  . Smoking status: Never Smoker   . Smokeless tobacco: Never Used  . Alcohol Use: No   Allergies  Allergen Reactions  . Amlodipine Hives    Swelling  . Sulfa Antibiotics Hives and Itching  . Vancomycin Hives and Itching   Current Outpatient Prescriptions  Medication Sig Dispense Refill  . furosemide (LASIX) 80 MG tablet Take 80 mg by mouth 2 (two) times daily.      . hydroxychloroquine (PLAQUENIL) 200 MG tablet Take 200 mg by mouth 2 (two) times daily.      Marland Kitchen labetalol (NORMODYNE) 300 MG tablet Take 300 mg by mouth 2  (two) times daily.      Marland Kitchen lisinopril (PRINIVIL,ZESTRIL) 40 MG tablet Take 40 mg by mouth daily.      . predniSONE (DELTASONE) 10 MG tablet Take 10 mg by mouth daily with breakfast.      . traZODone (DESYREL) 50 MG tablet Take 50 mg by mouth at bedtime.      . calcium acetate (PHOSLO) 667 MG capsule Take 667 mg by mouth 2 (two) times daily with a meal.      . oxyCODONE (ROXICODONE) 5 MG immediate release tablet Take 1 tablet (5 mg total) by mouth every 6 (six) hours as needed for severe pain.  30 tablet  0  . zolpidem (AMBIEN) 5 MG tablet Take 5 mg by mouth at bedtime as needed for sleep.       No current facility-administered medications for this visit.   REVIEW OF SYSTEMS: Valu.Nieves ] denotes positive finding; [  ] denotes negative finding  CARDIOVASCULAR:  [ ]  chest pain   [ ]  chest pressure   [ ]  palpitations   [ ]  orthopnea   [ ]  dyspnea on exertion   [ ]  claudication   [ ]  rest pain   [ ]  DVT   [ ]  phlebitis PULMONARY:   [ ]  productive cough   [ ]  asthma   [ ]  wheezing NEUROLOGIC:   [ ]  weakness  [ ]   paresthesias  [ ]  aphasia  [ ]  amaurosis  [ ]  dizziness HEMATOLOGIC:   [ ]  bleeding problems   [ ]  clotting disorders MUSCULOSKELETAL:  [ ]  joint pain   [ ]  joint swelling Valu.Nieves ] leg swelling GASTROINTESTINAL: [ ]   blood in stool  [ ]   hematemesis GENITOURINARY:  [ ]   dysuria  [ ]   hematuria PSYCHIATRIC:  [ ]  history of major depression INTEGUMENTARY:  [ ]  rashes  [ ]  ulcers CONSTITUTIONAL:  [ ]  fever   [ ]  chills  PHYSICAL EXAM: Filed Vitals:   12/30/13 1537  BP: 144/107  Pulse: 80  Height: 5\' 4"  (1.626 m)  Weight: 120 lb (54.432 kg)  SpO2: 100%   Body mass index is 20.59 kg/(m^2). GENERAL: The patient is a well-nourished female, in no acute distress. The vital signs are documented above. CARDIOVASCULAR: There is a regular rate and rhythm.  PULMONARY: There is good air exchange bilaterally without wheezing or rales. Her left upper arm loop graft has a good bruit and thrill.  She has a  palpable left radial pulse.  DATA:  I have independently interpreted her duplex of her left upper arm graft. She has elevated velocities at the venous anastomosis and also slightly elevated velocities in the proximal arterial segment of the graft.  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 She does have elevated velocities at the venous anastomosis. I've recommended that we proceed with a fistulogram and possible venoplasty. She dialyzes on Monday Wednesdays and Fridays so we will arrange this on a Tuesday or Thursday.   [place] [facility] [place] Beeper: [phone]

## 2013-12-30 NOTE — Assessment & Plan Note (Signed)
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 She does have elevated velocities at the venous anastomosis. I've recommended that we proceed with a fistulogram and possible venoplasty. She dialyzes on Monday Wednesdays and Fridays so we will arrange this on a Tuesday or Thursday.

## 2013-12-31 ENCOUNTER — Encounter (HOSPITAL_COMMUNITY): Payer: Self-pay | Admitting: Pharmacy Technician

## 2014-01-07 ENCOUNTER — Encounter (HOSPITAL_COMMUNITY)
Admission: RE | Disposition: A | Discharge status: Home / Self Care | Payer: Self-pay | Source: Ambulatory Visit | Attending: Vascular Surgery

## 2014-01-07 ENCOUNTER — Ambulatory Visit (HOSPITAL_COMMUNITY)
Admission: RE | Admit: 2014-01-07 | Discharge: 2014-01-07 | Disposition: A | Discharge status: Home / Self Care | Payer: Medicare Other | Source: Ambulatory Visit | Attending: Vascular Surgery | Admitting: Vascular Surgery

## 2014-01-07 DIAGNOSIS — N186 End stage renal disease: Secondary | ICD-10-CM | POA: Diagnosis not present

## 2014-01-07 DIAGNOSIS — M329 Systemic lupus erythematosus, unspecified: Secondary | ICD-10-CM | POA: Insufficient documentation

## 2014-01-07 DIAGNOSIS — N2581 Secondary hyperparathyroidism of renal origin: Secondary | ICD-10-CM | POA: Insufficient documentation

## 2014-01-07 DIAGNOSIS — T82598A Other mechanical complication of other cardiac and vascular devices and implants, initial encounter: Secondary | ICD-10-CM | POA: Diagnosis not present

## 2014-01-07 DIAGNOSIS — Z992 Dependence on renal dialysis: Secondary | ICD-10-CM | POA: Insufficient documentation

## 2014-01-07 DIAGNOSIS — E785 Hyperlipidemia, unspecified: Secondary | ICD-10-CM | POA: Insufficient documentation

## 2014-01-07 DIAGNOSIS — I120 Hypertensive chronic kidney disease with stage 5 chronic kidney disease or end stage renal disease: Secondary | ICD-10-CM | POA: Diagnosis not present

## 2014-01-07 DIAGNOSIS — T82898A Other specified complication of vascular prosthetic devices, implants and grafts, initial encounter: Secondary | ICD-10-CM | POA: Diagnosis not present

## 2014-01-07 DIAGNOSIS — Y832 Surgical operation with anastomosis, bypass or graft as the cause of abnormal reaction of the patient, or of later complication, without mention of misadventure at the time of the procedure: Secondary | ICD-10-CM | POA: Insufficient documentation

## 2014-01-07 HISTORY — PX: SHUNTOGRAM: SHX5491

## 2014-01-07 LAB — HCG, SERUM, QUALITATIVE: Preg, Serum: NEGATIVE

## 2014-01-07 LAB — POCT I-STAT, CHEM 8
BUN: 42 mg/dL — ABNORMAL HIGH (ref 6–23)
CALCIUM ION: 1.09 mmol/L — AB (ref 1.12–1.23)
CHLORIDE: 95 meq/L — AB (ref 96–112)
Creatinine, Ser: 5.7 mg/dL — ABNORMAL HIGH (ref 0.50–1.10)
GLUCOSE: 64 mg/dL — AB (ref 70–99)
HEMATOCRIT: 45 % (ref 36.0–46.0)
HEMOGLOBIN: 15.3 g/dL — AB (ref 12.0–15.0)
Potassium: 4 mEq/L (ref 3.7–5.3)
Sodium: 138 mEq/L (ref 137–147)
TCO2: 32 mmol/L (ref 0–100)

## 2014-01-07 LAB — HCG, QUANTITATIVE, PREGNANCY: hCG, Beta Chain, Quant, S: <1 m[IU]/mL (ref <5)

## 2014-01-07 SURGERY — ASSESSMENT, SHUNT FUNCTION, WITH CONTRAST RADIOGRAPHIC STUDY
Anesthesia: LOCAL

## 2014-01-07 MED ORDER — MIDAZOLAM HCL 2 MG/2ML IJ SOLN
INTRAMUSCULAR | Status: AC
  Filled 2014-01-07: qty 2

## 2014-01-07 MED ORDER — FENTANYL CITRATE 0.05 MG/ML IJ SOLN
INTRAMUSCULAR | Status: AC
  Filled 2014-01-07: qty 2

## 2014-01-07 MED ORDER — LIDOCAINE HCL (PF) 1 % IJ SOLN
INTRAMUSCULAR | Status: AC
  Filled 2014-01-07: qty 30

## 2014-01-07 MED ORDER — SODIUM CHLORIDE 0.9 % IJ SOLN: 3.0000 mL | Freq: Two times a day (BID) | INTRAMUSCULAR | Status: DC

## 2014-01-07 MED ORDER — HEPARIN (PORCINE) IN NACL 2-0.9 UNIT/ML-% IJ SOLN
INTRAMUSCULAR | Status: AC
  Filled 2014-01-07: qty 500

## 2014-01-07 MED ORDER — SODIUM CHLORIDE 0.9 % IV SOLN: 250.0000 mL | INTRAVENOUS | Status: DC | PRN

## 2014-01-07 MED ORDER — SODIUM CHLORIDE 0.9 % IJ SOLN: 3.0000 mL | INTRAMUSCULAR | Status: DC | PRN

## 2014-01-07 NOTE — Discharge Instructions (Signed)
AV Fistula Care After Refer to this sheet in the next few weeks. These instructions provide you with information on caring for yourself after your procedure. Your caregiver may also give you more specific instructions. Your treatment has been planned according to current medical practices, but problems sometimes occur. Call your caregiver if you have any problems or questions after your procedure. HOME CARE INSTRUCTIONS   Do not drive a car.  Do not drink alcohol.  Only take medicine that has been prescribed by your caregiver.  Do not sign important papers or make important decisions.  Have a responsible person with you.  Ask your caregiver to show you how to check your access at home for a vibration (called a "thrill") or for a sound (called a "bruit" pronounced brew-ee).  Your vein will need time to enlarge and mature so needles can be inserted for dialysis. Follow your caregiver's instructions about what you need to do to make this happen.  Keep dressings clean and dry.  Keep the arm elevated above your heart. Use a pillow.  Rest.  Use the arm as usual for all activities.  Have the stitches or tape closures removed in 10 to 14 days, or as directed by your caregiver.  Do not sleep or lie on the area of the fistula or that arm. This may decrease or stop the blood flow through your fistula.  Do not allow blood pressures to be taken on this arm.  Do not allow blood drawing to be done from the graft.  Do not wear tight clothing around the access site or on the arm.  Avoid lifting heavy objects with the arm that has the fistula.  Do not use creams or lotions over the access site. SEEK MEDICAL CARE IF:   You have a fever.  You have swelling around the fistula that gets worse, or you have new pain.  You have unusual bleeding at the fistula site or from any other area.  You have pus or other drainage at the fistula site.  You have skin redness or red streaking on the skin  around, above, or below the fistula site.  Your access site feels warm.  You have any flu-like symptoms. SEEK IMMEDIATE MEDICAL CARE IF:   You have pain, numbness, or an unusual pale skin on the hand or on the side of your fistula.  You have dizziness or weakness that you have not had before.  You have shortness of breath.  You have chest pain.  Your fistula disconnects or breaks, and there is bleeding that cannot be easily controlled. Call for local emergency medical help. Do not try to drive yourself to the hospital. MAKE SURE YOU  Understand these instructions.  Will watch your condition.  Will get help right away if you are not doing well or get worse. Document Released: 11/05/2005 Document Revised: 01/28/2012 Document Reviewed: 04/25/2011 [facility] Patient Information 2014 [place], [place].

## 2014-01-07 NOTE — Op Note (Signed)
   PATIENT: [name]  MRN: [id number] DOB: 1976-08-04    DATE OF PROCEDURE: 01/07/2014  INDICATIONS: ZOFIA FINNERAN is a 38 y.o. female Poorly functioning left upper arm AV graft  PROCEDURE: ultrasound-guided access to left AV graft with fistulogram left AV graft  SURGEON: Judeth Cornfield. Scot Dock, MD, FACS  ANESTHESIA: local with sedation   EBL: minimal  TECHNIQUE: The patient was taken to the peripheral vascular lab and sedated with 1 mg of Versed and 50 mcg of fentanyl. The left upper extremity was prepped and draped in usual sterile fashion. After the skin was infiltrated with 1% lidocaine, and under ultrasound guidance, the distal graft was cannulated with a micropuncture needle and a micro-posterior sheath introduced over a wire. Fistulogram was then obtained demonstrating both the arterial and venous ends of the graft. At the completion of the procedure tube 4-0 Monocryl sutures were placed for hemostasis.  FINDINGS:  1. The arterial limb of the graft is widely patent. There is narrowing however in the axillary artery adjacent to the anastomosis and the very proximal anastomosis. 2. There is a segment of plaque within the venous half of the graft on the lateral aspect of the upper arm. 3. The outflow vein on the left is atretic and feet into a collateral which ultimately empties into the axillary vein fairly high up in the chest where it is likely not surgically accessible.  CLINICAL NOTE:I have recommended placement of a new right AV fistula or AV graft. As long as her current graft in the left arm is functioning adequately we would try to avoid placing a catheter. If this graft fails however she would need a catheter placed in addition. We will schedule her surgery on a nondialysis day in the near future.  [name], MD, FACS [facility] of [facility]  DATE OF DICTATION:   01/07/2014

## 2014-01-07 NOTE — H&P (View-Only) (Signed)
[facility] Specialist of [facility]  Patient name: [name] MRN: [id number] DOB: 04-Apr-1976 Sex: female  REASON FOR VISIT: Poor flows in AV fistula and increased problems with bleeding.  HPI: [name] is a 38 y.o. female who had presented on 10/09/2013 with a large infiltrate in her left upper arm after dialysis. She underwent evacuation of the hematoma in her left upper arm and placement of a tunneled dialysis catheter. I last saw her on 11/11/2013. At that point, I thought that it would be reasonable to begin using her left upper arm AV graft.  She has been using her catheter for dialysis. Her tunneled dialysis catheter was removed. However, she was sent back today because the dialysis center reports decreased access flow and also prolonged bleeding times. She has no specific complaints referrable to her left upper extremity.  Past Medical History  Diagnosis Date  . Hypertension   . Lupus   . Hyperlipemia   . Anemia   . Secondary hyperparathyroidism (of renal origin)   . 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 Avascular necrosis of bone     left tibial talus due to chronic prednisone use   Family History  Problem Relation Age of Onset  . Asthma Sister   . Hypertension Mother   . Heart attack Mother    SOCIAL HISTORY: History  Substance Use Topics  . Smoking status: Never Smoker   . Smokeless tobacco: Never Used  . Alcohol Use: No   Allergies  Allergen Reactions  . Amlodipine Hives    Swelling  . Sulfa Antibiotics Hives and Itching  . Vancomycin Hives and Itching   Current Outpatient Prescriptions  Medication Sig Dispense Refill  . furosemide (LASIX) 80 MG tablet Take 80 mg by mouth 2 (two) times daily.      . hydroxychloroquine (PLAQUENIL) 200 MG tablet Take 200 mg by mouth 2 (two) times daily.      Marland Kitchen labetalol (NORMODYNE) 300 MG tablet Take 300 mg by mouth 2  (two) times daily.      Marland Kitchen lisinopril (PRINIVIL,ZESTRIL) 40 MG tablet Take 40 mg by mouth daily.      . predniSONE (DELTASONE) 10 MG tablet Take 10 mg by mouth daily with breakfast.      . traZODone (DESYREL) 50 MG tablet Take 50 mg by mouth at bedtime.      . calcium acetate (PHOSLO) 667 MG capsule Take 667 mg by mouth 2 (two) times daily with a meal.      . oxyCODONE (ROXICODONE) 5 MG immediate release tablet Take 1 tablet (5 mg total) by mouth every 6 (six) hours as needed for severe pain.  30 tablet  0  . zolpidem (AMBIEN) 5 MG tablet Take 5 mg by mouth at bedtime as needed for sleep.       No current facility-administered medications for this visit.   REVIEW OF SYSTEMS: Valu.Nieves ] denotes positive finding; [  ] denotes negative finding  CARDIOVASCULAR:  [ ]  chest pain   [ ]  chest pressure   [ ]  palpitations   [ ]  orthopnea   [ ]  dyspnea on exertion   [ ]  claudication   [ ]  rest pain   [ ]  DVT   [ ]  phlebitis PULMONARY:   [ ]  productive cough   [ ]  asthma   [ ]  wheezing NEUROLOGIC:   [ ]  weakness  [ ]   paresthesias  [ ]  aphasia  [ ]  amaurosis  [ ]  dizziness HEMATOLOGIC:   [ ]  bleeding problems   [ ]  clotting disorders MUSCULOSKELETAL:  [ ]  joint pain   [ ]  joint swelling Valu.Nieves ] leg swelling GASTROINTESTINAL: [ ]   blood in stool  [ ]   hematemesis GENITOURINARY:  [ ]   dysuria  [ ]   hematuria PSYCHIATRIC:  [ ]  history of major depression INTEGUMENTARY:  [ ]  rashes  [ ]  ulcers CONSTITUTIONAL:  [ ]  fever   [ ]  chills  PHYSICAL EXAM: Filed Vitals:   12/30/13 1537  BP: 144/107  Pulse: 80  Height: 5\' 4"  (1.626 m)  Weight: 120 lb (54.432 kg)  SpO2: 100%   Body mass index is 20.59 kg/(m^2). GENERAL: The patient is a well-nourished female, in no acute distress. The vital signs are documented above. CARDIOVASCULAR: There is a regular rate and rhythm.  PULMONARY: There is good air exchange bilaterally without wheezing or rales. Her left upper arm loop graft has a good bruit and thrill.  She has a  palpable left radial pulse.  DATA:  I have independently interpreted her duplex of her left upper arm graft. She has elevated velocities at the venous anastomosis and also slightly elevated velocities in the proximal arterial segment of the graft.  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 She does have elevated velocities at the venous anastomosis. I've recommended that we proceed with a fistulogram and possible venoplasty. She dialyzes on Monday Wednesdays and Fridays so we will arrange this on a Tuesday or Thursday.   [place] [facility] [place] Beeper: [phone]

## 2014-01-07 NOTE — Interval H&P Note (Signed)
History and Physical Interval Note:  01/07/2014 9:09 AM  [name]  has presented today for surgery, with the diagnosis of pvd  The various methods of treatment have been discussed with the patient and family. After consideration of risks, benefits and other options for treatment, the patient has consented to  Procedure(s): fistulogram with possibe venoplasty left upper arm avg (N/A) as a surgical intervention .  The patient's history has been reviewed, patient examined, no change in status, stable for surgery.  I have reviewed the patient's chart and labs.  Questions were answered to the patient's satisfaction.     Lennon Boutwell S

## 2014-01-08 ENCOUNTER — Telehealth: Payer: Self-pay | Admitting: Vascular Surgery

## 2014-01-08 NOTE — Telephone Encounter (Addendum)
Message copied by Gena Fray on Fri Jan 08, 2014  2:04 PM ------      Message from: Peter Minium K      Created: Thu Jan 07, 2014 10:13 AM      Regarding: Schedule       Please set her up for vein map and then see MD to set up surgery.             ----- Message -----         From: Angelia Mould, MD         Sent: 01/07/2014  10:07 AM           To: Vvs Charge Pool      Subject: charge and scheduled                                     Patient had ultrasound-guided access to her left AV graft and a fistulogram. She needs to come to the office and had vein mapping in her right arm and then be scheduled for a right AV fistula or AV graft on a nondialysis day. She dialyzes on Monday Wednesdays and Fridays would have to do this on Tuesday or Thursday. Thank you. CD       ------  01/08/14: pt wcb to get appt, as she was at dialysis, dpm

## 2014-01-16 DIAGNOSIS — N186 End stage renal disease: Secondary | ICD-10-CM | POA: Diagnosis not present

## 2014-01-18 DIAGNOSIS — N2581 Secondary hyperparathyroidism of renal origin: Secondary | ICD-10-CM | POA: Diagnosis not present

## 2014-01-18 DIAGNOSIS — N186 End stage renal disease: Secondary | ICD-10-CM | POA: Diagnosis not present

## 2014-01-18 DIAGNOSIS — D631 Anemia in chronic kidney disease: Secondary | ICD-10-CM | POA: Diagnosis not present

## 2014-01-19 ENCOUNTER — Other Ambulatory Visit: Payer: Self-pay | Admitting: Vascular Surgery

## 2014-01-19 DIAGNOSIS — Z01810 Encounter for preprocedural cardiovascular examination: Secondary | ICD-10-CM

## 2014-01-19 DIAGNOSIS — N186 End stage renal disease: Secondary | ICD-10-CM

## 2014-01-20 ENCOUNTER — Encounter: Payer: Self-pay | Admitting: Vascular Surgery

## 2014-01-21 ENCOUNTER — Encounter: Payer: Self-pay | Admitting: Vascular Surgery

## 2014-01-21 ENCOUNTER — Ambulatory Visit (HOSPITAL_COMMUNITY)
Admission: RE | Admit: 2014-01-21 | Discharge: 2014-01-21 | Disposition: A | Discharge status: Home / Self Care | Payer: Medicare Other | Source: Ambulatory Visit | Attending: Vascular Surgery | Admitting: Vascular Surgery

## 2014-01-21 ENCOUNTER — Ambulatory Visit (INDEPENDENT_AMBULATORY_CARE_PROVIDER_SITE_OTHER): Payer: Medicare Other | Admitting: Vascular Surgery

## 2014-01-21 ENCOUNTER — Other Ambulatory Visit: Payer: Self-pay | Admitting: *Deleted

## 2014-01-21 VITALS — BP 194/124 | HR 65 | Ht 64.0 in | Wt 122.4 lb

## 2014-01-21 DIAGNOSIS — Z01818 Encounter for other preprocedural examination: Secondary | ICD-10-CM | POA: Insufficient documentation

## 2014-01-21 DIAGNOSIS — Z01810 Encounter for preprocedural cardiovascular examination: Secondary | ICD-10-CM

## 2014-01-21 DIAGNOSIS — N189 Chronic kidney disease, unspecified: Secondary | ICD-10-CM | POA: Insufficient documentation

## 2014-01-21 DIAGNOSIS — N186 End stage renal disease: Secondary | ICD-10-CM

## 2014-01-21 NOTE — Progress Notes (Signed)
[facility] [place] HISTORY AND PHYSICAL   History of Present Illness:  Patient is a 38 y.o. year old female who presents for placement of a permanent hemodialysis access. The patient currently has a functioning but failing left upper arm loop graft. She is here for consideration for placement of a right arm access prior to failure of the left. Her current dialysis days Monday Wednesday Friday very Other chronic medical problems include hypertension, lupus, hyperlipidemia, anemia. These are all currently stable.  Past Medical History  Diagnosis Date  . Hypertension   . Lupus   . Hyperlipemia   . Anemia   . Secondary hyperparathyroidism (of renal origin)   . 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 Avascular necrosis of bone     left tibial talus due to chronic prednisone use    Past Surgical History  Procedure Laterality Date  . Av fistula placement Left   . Incision and drainage abscess      gluteal  . Tibiocalcaneal fusion  left    . Hematoma evacuation Left 10/09/2013    Procedure: EVACUATION HEMATOMA;  Surgeon: Angelia Mould, MD;  Location: [facility];  Service: Vascular;  Laterality: Left;  . Insertion of dialysis catheter N/A 10/09/2013    Procedure: INSERTION OF DIALYSIS CATHETER; ULTRASOUND GUIDED;  Surgeon: Angelia Mould, MD;  Location: [facility] OR;  Service: Vascular;  Laterality: N/A;     Social History History  Substance Use Topics  . Smoking status: Never Smoker   . Smokeless tobacco: Never Used  . Alcohol Use: No    Family History Family History  Problem Relation Age of Onset  . Asthma Sister   . Hypertension Mother   . Heart attack Mother     Allergies  Allergies  Allergen Reactions  . Amlodipine Hives    Swelling  . Sulfa Antibiotics Hives and Itching  . Vancomycin Hives and Itching     Current Outpatient Prescriptions  Medication  Sig Dispense Refill  . calcium acetate (PHOSLO) 667 MG capsule Take 667 mg by mouth 2 (two) times daily with a meal.      . furosemide (LASIX) 80 MG tablet Take 80 mg by mouth 2 (two) times daily.      . hydroxychloroquine (PLAQUENIL) 200 MG tablet Take 200 mg by mouth 2 (two) times daily.      Marland Kitchen labetalol (NORMODYNE) 300 MG tablet Take 300 mg by mouth 2 (two) times daily.      Marland Kitchen lisinopril (PRINIVIL,ZESTRIL) 40 MG tablet Take 40 mg by mouth daily.      . predniSONE (DELTASONE) 10 MG tablet Take 10 mg by mouth daily with breakfast.      . traZODone (DESYREL) 50 MG tablet Take 25 mg by mouth at bedtime.        No current facility-administered medications for this visit.    ROS:   General:  No weight loss, Fever, chills  HEENT: No recent headaches, no nasal bleeding, no visual changes, no sore throat  Neurologic: No dizziness, blackouts, seizures. No recent symptoms of stroke or mini- stroke. No recent episodes of slurred speech, or temporary blindness.  Cardiac: No recent episodes of chest pain/pressure, no shortness of breath at rest.  No shortness of breath with exertion.  Denies history of atrial fibrillation or irregular heartbeat  Vascular: No history of rest pain in feet.  No history of  claudication.  No history of non-healing ulcer, No history of DVT   Pulmonary: No home oxygen, no productive cough, no hemoptysis,  No asthma or wheezing  Musculoskeletal:  [ ]  Arthritis, [ ]  Low back pain,  [ ]  Joint pain  Hematologic:No history of hypercoagulable state.  No history of easy bleeding.  No history of anemia  Gastrointestinal: No hematochezia or melena,  No gastroesophageal reflux, no trouble swallowing  Urinary: [ ]  chronic Kidney disease, [ x] on HD - [x ] MWF or [ ]  TTHS, [ ]  Burning with urination, [ ]  Frequent urination, [ ]  Difficulty urinating;   Skin: No rashes  Psychological: No history of anxiety,  No history of depression   Physical Examination  Filed Vitals:    01/21/14 1142  BP: 194/124  Pulse: 65  Height: 5\' 4"  (1.626 m)  Weight: 122 lb 6.4 oz (55.52 kg)  SpO2: 100%    Body mass index is 21 kg/(m^2).  General:  Alert and oriented, no acute distress HEENT: Normal Neck: No bruit or JVD Pulmonary: Clear to auscultation bilaterally Cardiac: Regular Rate and Rhythm without murmur Skin: No rash Extremity Pulses:  2+ radial, brachial pulses bilaterally, left upper arm loop graft palpable pulse week thrill Musculoskeletal: No deformity or edema  Neurologic: Upper and lower extremity motor 5/5 and symmetric  DATA: Patient had a vein mapping ultrasound today which shows the cephalic and basilic veins are quite small less than 2 mm right arm   ASSESSMENT: Right upper arm graft Tuesday, March 10. Risks benefits possible complications and procedure details explained to the patient today including but not limited to bleeding infection graft thrombosis ischemic steal she understands and agrees to proceed. I will decide on a loop graft versus a bridge graft at the time of operation based on the size of her brachial artery   PLAN: See above  Ruta Hinds, MD [facility] [place]: [phone] Pager: [phone]

## 2014-01-22 ENCOUNTER — Encounter (HOSPITAL_COMMUNITY): Payer: Self-pay | Admitting: *Deleted

## 2014-01-22 ENCOUNTER — Encounter (HOSPITAL_COMMUNITY): Payer: Self-pay | Admitting: Pharmacy Technician

## 2014-01-22 DIAGNOSIS — I871 Compression of vein: Secondary | ICD-10-CM | POA: Diagnosis not present

## 2014-01-22 DIAGNOSIS — N186 End stage renal disease: Secondary | ICD-10-CM | POA: Diagnosis not present

## 2014-01-22 DIAGNOSIS — T82898A Other specified complication of vascular prosthetic devices, implants and grafts, initial encounter: Secondary | ICD-10-CM | POA: Diagnosis not present

## 2014-01-25 MED ORDER — DEXTROSE 5 % IV SOLN
1.5000 g | INTRAVENOUS | Status: AC
  Administered 2014-01-26: 1.5 g via INTRAVENOUS
  Filled 2014-01-25: qty 1.5

## 2014-01-26 ENCOUNTER — Encounter (HOSPITAL_COMMUNITY)
Admission: RE | Disposition: A | Discharge status: Home / Self Care | Payer: Self-pay | Source: Ambulatory Visit | Attending: Vascular Surgery

## 2014-01-26 ENCOUNTER — Ambulatory Visit (HOSPITAL_COMMUNITY): Payer: Medicare Other | Admitting: Anesthesiology

## 2014-01-26 ENCOUNTER — Encounter (HOSPITAL_COMMUNITY): Payer: Self-pay | Admitting: Anesthesiology

## 2014-01-26 ENCOUNTER — Encounter (HOSPITAL_COMMUNITY): Payer: Medicare Other | Admitting: Anesthesiology

## 2014-01-26 ENCOUNTER — Ambulatory Visit (HOSPITAL_COMMUNITY): Payer: Medicare Other

## 2014-01-26 ENCOUNTER — Ambulatory Visit (HOSPITAL_COMMUNITY)
Admission: RE | Admit: 2014-01-26 | Discharge: 2014-01-26 | Disposition: A | Discharge status: Home / Self Care | Payer: Medicare Other | Source: Ambulatory Visit | Attending: Vascular Surgery | Admitting: Vascular Surgery

## 2014-01-26 DIAGNOSIS — Z992 Dependence on renal dialysis: Secondary | ICD-10-CM | POA: Insufficient documentation

## 2014-01-26 DIAGNOSIS — I10 Essential (primary) hypertension: Secondary | ICD-10-CM | POA: Diagnosis not present

## 2014-01-26 DIAGNOSIS — I120 Hypertensive chronic kidney disease with stage 5 chronic kidney disease or end stage renal disease: Secondary | ICD-10-CM | POA: Diagnosis not present

## 2014-01-26 DIAGNOSIS — M329 Systemic lupus erythematosus, unspecified: Secondary | ICD-10-CM | POA: Diagnosis not present

## 2014-01-26 DIAGNOSIS — N2581 Secondary hyperparathyroidism of renal origin: Secondary | ICD-10-CM | POA: Insufficient documentation

## 2014-01-26 DIAGNOSIS — N186 End stage renal disease: Secondary | ICD-10-CM | POA: Insufficient documentation

## 2014-01-26 DIAGNOSIS — D649 Anemia, unspecified: Secondary | ICD-10-CM | POA: Diagnosis not present

## 2014-01-26 DIAGNOSIS — T82898A Other specified complication of vascular prosthetic devices, implants and grafts, initial encounter: Secondary | ICD-10-CM | POA: Diagnosis not present

## 2014-01-26 DIAGNOSIS — E785 Hyperlipidemia, unspecified: Secondary | ICD-10-CM | POA: Insufficient documentation

## 2014-01-26 DIAGNOSIS — Z01818 Encounter for other preprocedural examination: Secondary | ICD-10-CM | POA: Diagnosis not present

## 2014-01-26 HISTORY — DX: Headache: R51

## 2014-01-26 HISTORY — PX: AV FISTULA PLACEMENT: SHX1204

## 2014-01-26 LAB — HCG, SERUM, QUALITATIVE: PREG SERUM: NEGATIVE

## 2014-01-26 LAB — POCT I-STAT 4, (NA,K, GLUC, HGB,HCT)
GLUCOSE: 72 mg/dL (ref 70–99)
HEMATOCRIT: 28 % — AB (ref 36.0–46.0)
Hemoglobin: 9.5 g/dL — ABNORMAL LOW (ref 12.0–15.0)
Potassium: 3.5 mEq/L — ABNORMAL LOW (ref 3.7–5.3)
Sodium: 139 mEq/L (ref 137–147)

## 2014-01-26 SURGERY — ARTERIOVENOUS (AV) FISTULA CREATION
Anesthesia: Monitor Anesthesia Care | Site: Arm Lower | Laterality: Right

## 2014-01-26 MED ORDER — SODIUM CHLORIDE 0.9 % IV SOLN
INTRAVENOUS | Status: DC | PRN
  Administered 2014-01-26: 10:00:00 via INTRAVENOUS

## 2014-01-26 MED ORDER — FENTANYL CITRATE 0.05 MG/ML IJ SOLN
INTRAMUSCULAR | Status: AC
  Filled 2014-01-26: qty 5

## 2014-01-26 MED ORDER — LIDOCAINE-EPINEPHRINE (PF) 1 %-1:200000 IJ SOLN
INTRAMUSCULAR | Status: DC | PRN
  Administered 2014-01-26: 30 mL

## 2014-01-26 MED ORDER — LIDOCAINE HCL (CARDIAC) 20 MG/ML IV SOLN
INTRAVENOUS | Status: DC | PRN
  Administered 2014-01-26: 50 mg via INTRAVENOUS

## 2014-01-26 MED ORDER — PROPOFOL 10 MG/ML IV BOLUS
INTRAVENOUS | Status: AC
  Filled 2014-01-26: qty 20

## 2014-01-26 MED ORDER — OXYCODONE HCL 5 MG PO TABS
ORAL_TABLET | ORAL | Status: AC
  Filled 2014-01-26: qty 1

## 2014-01-26 MED ORDER — THROMBIN 20000 UNITS EX SOLR
CUTANEOUS | Status: AC
  Filled 2014-01-26: qty 20000

## 2014-01-26 MED ORDER — FENTANYL CITRATE 0.05 MG/ML IJ SOLN
INTRAMUSCULAR | Status: DC | PRN
  Administered 2014-01-26 (×3): 50 ug via INTRAVENOUS
  Administered 2014-01-26: 25 ug via INTRAVENOUS
  Administered 2014-01-26: 50 ug via INTRAVENOUS
  Administered 2014-01-26: 25 ug via INTRAVENOUS

## 2014-01-26 MED ORDER — MIDAZOLAM HCL 5 MG/5ML IJ SOLN
INTRAMUSCULAR | Status: DC | PRN
  Administered 2014-01-26: 2 mg via INTRAVENOUS

## 2014-01-26 MED ORDER — OXYCODONE HCL 5 MG/5ML PO SOLN: 5.0000 mg | Freq: Once | ORAL | Status: AC | PRN

## 2014-01-26 MED ORDER — CHLORHEXIDINE GLUCONATE CLOTH 2 % EX PADS: 6.0000 | MEDICATED_PAD | Freq: Once | CUTANEOUS | Status: DC

## 2014-01-26 MED ORDER — LIDOCAINE HCL (PF) 1 % IJ SOLN
INTRAMUSCULAR | Status: AC
  Filled 2014-01-26: qty 30

## 2014-01-26 MED ORDER — PROPOFOL 10 MG/ML IV BOLUS
INTRAVENOUS | Status: DC | PRN
  Administered 2014-01-26 (×2): 20 mg via INTRAVENOUS
  Administered 2014-01-26 (×2): 10 mg via INTRAVENOUS

## 2014-01-26 MED ORDER — ONDANSETRON HCL 4 MG/2ML IJ SOLN
INTRAMUSCULAR | Status: AC
  Filled 2014-01-26: qty 2

## 2014-01-26 MED ORDER — SODIUM CHLORIDE 0.9 % IV SOLN
INTRAVENOUS | Status: DC
  Administered 2014-01-26: 09:00:00 via INTRAVENOUS

## 2014-01-26 MED ORDER — HYDROMORPHONE HCL PF 1 MG/ML IJ SOLN: 0.2500 mg | INTRAMUSCULAR | Status: DC | PRN

## 2014-01-26 MED ORDER — BUPIVACAINE HCL (PF) 0.5 % IJ SOLN
INTRAMUSCULAR | Status: DC | PRN
  Administered 2014-01-26: 30 mL

## 2014-01-26 MED ORDER — OXYCODONE-ACETAMINOPHEN 5-325 MG PO TABS: 1.0000 | ORAL_TABLET | Freq: Four times a day (QID) | ORAL | Status: DC | PRN

## 2014-01-26 MED ORDER — 0.9 % SODIUM CHLORIDE (POUR BTL) OPTIME
TOPICAL | Status: DC | PRN
  Administered 2014-01-26: 1000 mL

## 2014-01-26 MED ORDER — OXYCODONE HCL 5 MG PO TABS
5.0000 mg | ORAL_TABLET | Freq: Once | ORAL | Status: AC | PRN
  Administered 2014-01-26: 5 mg via ORAL

## 2014-01-26 MED ORDER — MIDAZOLAM HCL 2 MG/2ML IJ SOLN
INTRAMUSCULAR | Status: AC
  Filled 2014-01-26: qty 2

## 2014-01-26 MED ORDER — SODIUM CHLORIDE 0.9 % IR SOLN
Status: DC | PRN
  Administered 2014-01-26: 11:00:00

## 2014-01-26 SURGICAL SUPPLY — 35 items
ADH SKN CLS APL DERMABOND .7 (GAUZE/BANDAGES/DRESSINGS) ×2
ARMBAND PINK RESTRICT EXTREMIT (MISCELLANEOUS) ×4 IMPLANT
CANISTER SUCTION 2500CC (MISCELLANEOUS) ×4 IMPLANT
CLIP TI MEDIUM 6 (CLIP) ×4 IMPLANT
CLIP TI WIDE RED SMALL 6 (CLIP) ×4 IMPLANT
COVER SURGICAL LIGHT HANDLE (MISCELLANEOUS) ×4 IMPLANT
DECANTER SPIKE VIAL GLASS SM (MISCELLANEOUS) ×1 IMPLANT
DERMABOND ADVANCED (GAUZE/BANDAGES/DRESSINGS) ×2
DERMABOND ADVANCED .7 DNX12 (GAUZE/BANDAGES/DRESSINGS) ×2 IMPLANT
ELECT REM PT RETURN 9FT ADLT (ELECTROSURGICAL) ×4
ELECTRODE REM PT RTRN 9FT ADLT (ELECTROSURGICAL) ×2 IMPLANT
GLOVE BIO SURGEON STRL SZ7 (GLOVE) ×4 IMPLANT
GLOVE BIOGEL PI IND STRL 7.5 (GLOVE) ×2 IMPLANT
GLOVE BIOGEL PI INDICATOR 7.5 (GLOVE) ×2
GOWN STRL REUS W/ TWL LRG LVL3 (GOWN DISPOSABLE) ×6 IMPLANT
GOWN STRL REUS W/ TWL XL LVL3 (GOWN DISPOSABLE) ×1 IMPLANT
GOWN STRL REUS W/TWL LRG LVL3 (GOWN DISPOSABLE) ×12
GOWN STRL REUS W/TWL XL LVL3 (GOWN DISPOSABLE) ×4
KIT BASIN OR (CUSTOM PROCEDURE TRAY) ×4 IMPLANT
KIT ROOM TURNOVER OR (KITS) ×4 IMPLANT
NS IRRIG 1000ML POUR BTL (IV SOLUTION) ×4 IMPLANT
PACK CV ACCESS (CUSTOM PROCEDURE TRAY) ×4 IMPLANT
PAD ARMBOARD 7.5X6 YLW CONV (MISCELLANEOUS) ×8 IMPLANT
SPONGE SURGIFOAM ABS GEL 100 (HEMOSTASIS) IMPLANT
SUT MNCRL AB 4-0 PS2 18 (SUTURE) ×4 IMPLANT
SUT PROLENE 5 0 C 1 24 (SUTURE) IMPLANT
SUT PROLENE 6 0 BV (SUTURE) ×8 IMPLANT
SUT PROLENE 7 0 BV 1 (SUTURE) ×9 IMPLANT
SUT SILK 2 0 FS (SUTURE) ×1 IMPLANT
SUT VIC AB 3-0 SH 27 (SUTURE) ×4
SUT VIC AB 3-0 SH 27X BRD (SUTURE) ×3 IMPLANT
TOWEL OR 17X24 6PK STRL BLUE (TOWEL DISPOSABLE) ×4 IMPLANT
TOWEL OR 17X26 10 PK STRL BLUE (TOWEL DISPOSABLE) ×4 IMPLANT
UNDERPAD 30X30 INCONTINENT (UNDERPADS AND DIAPERS) ×4 IMPLANT
WATER STERILE IRR 1000ML POUR (IV SOLUTION) ×4 IMPLANT

## 2014-01-26 NOTE — Progress Notes (Signed)
Nurse called into OR Room 10 and spoke with Tiffany and asked Tiffany to ask Dr. Bridgett Larsson where IV should be placed. Dr. Bridgett Larsson ordered for IV to be placed in patients foot or neck.

## 2014-01-26 NOTE — Anesthesia Postprocedure Evaluation (Signed)
  Anesthesia Post-op Note  Patient: [name]  Procedure(s) Performed: Procedure(s): ARTERIOVENOUS (AV) FISTULA CREATION; ultrasound guided (Right)  Patient Location: PACU  Anesthesia Type: MAC  Level of Consciousness: awake and alert   Airway and Oxygen Therapy: Patient Spontanous Breathing  Post-op Pain: mild  Post-op Assessment: Post-op Vital signs reviewed, Patient's Cardiovascular Status Stable and Respiratory Function Stable  Post-op Vital Signs: Reviewed  Filed Vitals:   01/26/14 1220  BP: 106/58  Pulse: 87  Temp: 36.3 C  Resp: 16    Complications: No apparent anesthesia complications

## 2014-01-26 NOTE — Op Note (Signed)
OPERATIVE NOTE   PROCEDURE: 1. right first stage brachial vein transposition (brachiobrachial arteriovenous fistula) placement  PRE-OPERATIVE DIAGNOSIS: end stage renal disease, failing Left arm arteriovenous graft   POST-OPERATIVE DIAGNOSIS: same as above   SURGEON: Adele Barthel, MD  ASSISTANT(S): Gerri Lins, PAC   ANESTHESIA: local and MAC  ESTIMATED BLOOD LOSS: 30 cc  FINDING(S): 1. Small brachial artery (2.0-2.5 mm) 2. Small brachial vein (2.5-3.0 mm) 3. Spasm in brachial artery with spasm in fistula at end of case 4. Dopplerable left radial signal  SPECIMEN(S):  none  INDICATIONS:   [name] is a 38 y.o. female who presents with failing left arm arteriovenous fistula.  Based on Dr. Oneida Alar' evaluation, this patient likely needed a right forearm loop and upper arm arteriovenous graft.  I discussed with the patient the possibility of a brachial vein transposition if the vein was adequate.  The patient is scheduled for right first stage brachial vein transposition vs. arteriovenous graft placement.  The patient is aware the risks include but are not limited to: bleeding, infection, steal syndrome, nerve damage, ischemic monomelic neuropathy, failure to mature, and need for additional procedures.  The patient is aware of the risks of the procedure and elects to proceed forward.  DESCRIPTION: After full informed written consent was obtained from the patient, the patient was brought back to the operating room and placed supine upon the operating table.  Prior to induction, the patient received IV antibiotics.   After obtaining adequate anesthesia, the patient was then prepped and draped in the standard fashion for a right arm access procedure.  I turned my attention first to identifying the patient's brachial vein and brachial artery.  Using SonoSite guidance, the location of these vessels were marked out on the skin.   At this point, I injected local anesthetic to  obtain a field block of the antecubitum.  In total, I injected about 5 mL of a 1:1 mixture of 0.5% Marcaine without epinephrine and 1% lidocaine with epinephrine.  I made a longitudinal incision at the level of the antecubitum and dissected through the subcutaneous tissue and fascia to gain exposure of the brachial artery.  This was noted to be 2.0-2.5 mm in diameter externally.  I suspect this might be a radial artery in the presence of a high bifurcation.  This was dissected out proximally and distally and controlled with vessel loops .  I then dissected out the brachial vein.  This was noted to be 2.5-3.0 mm in diameter externally.  The distal segment of the vein was ligated with a  2-0 silk, and the vein was transected.  The proximal segment was iinterrogated with serial dilators.  The vein accepted up to a 3 mm dilator without any difficulty.  I then instilled the heparinized saline into the vein and clamped it.  At this point, I reset my exposure of the brachial artery and placed the artery under tension proximally and distally.  I made an arteriotomy with a #11 blade, and then I extended the arteriotomy with a Potts scissor.  I injected heparinized saline proximal and distal to this arteriotomy.  The vein was then sewn to the artery in an end-to-side configuration with a running stitch of 7-0 Prolene.  Prior to completing this anastomosis, I allowed the vein and artery to backbleed.  There was no evidence of clot from any vessels.  I completed the anastomosis in the usual fashion and then released all vessel loops and clamps.  There  was a dopplerable thrill in the venous outflow, and there was a dopplerable radial signal.  I expect this to improve as the spasm in the artery and vein resolve.  At this point, I irrigated out the surgical wound.  There was no further active bleeding.  The subcutaneous tissue was reapproximated with a running stitch of 3-0 Vicryl.  The skin was then reapproximated with a running  subcuticular stitch of 4-0 Vicryl.  The skin was then cleaned, dried, and reinforced with Dermabond.  The patient tolerated this procedure well.   COMPLICATIONS: none  CONDITION: stable  Adele Barthel, MD [facility] [place] Office: [phone] Pager: [phone]  01/26/2014, 11:31 AM

## 2014-01-26 NOTE — Interval H&P Note (Signed)
[facility] [place]  History and Physical Update  The patient was interviewed and re-examined.  The patient's previous History and Physical has been reviewed and is unchanged from Dr. Nona Dell  consult.  I discussed with the patient possible stage brachial vein transposition placement vs. upper arm arteriovenous graft placement.  Risk, benefits, and alternatives to access surgery were discussed.  The patient is aware the risks include but are not limited to: bleeding, infection, steal syndrome, nerve damage, ischemic monomelic neuropathy, failure to mature, need for additional procedures, death and stroke.  The patient agrees to proceed forward with the procedure.  Adele Barthel, MD [facility] [place] Office: [phone] Pager: [phone]  01/26/2014, 10:06 AM

## 2014-01-26 NOTE — H&P (View-Only) (Signed)
[facility] [place] HISTORY AND PHYSICAL   History of Present Illness:  Patient is a 38 y.o. year old female who presents for placement of a permanent hemodialysis access. The patient currently has a functioning but failing left upper arm loop graft. She is here for consideration for placement of a right arm access prior to failure of the left. Her current dialysis days Monday Wednesday Friday very Other chronic medical problems include hypertension, lupus, hyperlipidemia, anemia. These are all currently stable.  Past Medical History  Diagnosis Date  . Hypertension   . Lupus   . Hyperlipemia   . Anemia   . Secondary hyperparathyroidism (of renal origin)   . 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 Avascular necrosis of bone     left tibial talus due to chronic prednisone use    Past Surgical History  Procedure Laterality Date  . Av fistula placement Left   . Incision and drainage abscess      gluteal  . Tibiocalcaneal fusion  left    . Hematoma evacuation Left 10/09/2013    Procedure: EVACUATION HEMATOMA;  Surgeon: Angelia Mould, MD;  Location: [facility];  Service: Vascular;  Laterality: Left;  . Insertion of dialysis catheter N/A 10/09/2013    Procedure: INSERTION OF DIALYSIS CATHETER; ULTRASOUND GUIDED;  Surgeon: Angelia Mould, MD;  Location: [facility] OR;  Service: Vascular;  Laterality: N/A;     Social History History  Substance Use Topics  . Smoking status: Never Smoker   . Smokeless tobacco: Never Used  . Alcohol Use: No    Family History Family History  Problem Relation Age of Onset  . Asthma Sister   . Hypertension Mother   . Heart attack Mother     Allergies  Allergies  Allergen Reactions  . Amlodipine Hives    Swelling  . Sulfa Antibiotics Hives and Itching  . Vancomycin Hives and Itching     Current Outpatient Prescriptions  Medication  Sig Dispense Refill  . calcium acetate (PHOSLO) 667 MG capsule Take 667 mg by mouth 2 (two) times daily with a meal.      . furosemide (LASIX) 80 MG tablet Take 80 mg by mouth 2 (two) times daily.      . hydroxychloroquine (PLAQUENIL) 200 MG tablet Take 200 mg by mouth 2 (two) times daily.      Marland Kitchen labetalol (NORMODYNE) 300 MG tablet Take 300 mg by mouth 2 (two) times daily.      Marland Kitchen lisinopril (PRINIVIL,ZESTRIL) 40 MG tablet Take 40 mg by mouth daily.      . predniSONE (DELTASONE) 10 MG tablet Take 10 mg by mouth daily with breakfast.      . traZODone (DESYREL) 50 MG tablet Take 25 mg by mouth at bedtime.        No current facility-administered medications for this visit.    ROS:   General:  No weight loss, Fever, chills  HEENT: No recent headaches, no nasal bleeding, no visual changes, no sore throat  Neurologic: No dizziness, blackouts, seizures. No recent symptoms of stroke or mini- stroke. No recent episodes of slurred speech, or temporary blindness.  Cardiac: No recent episodes of chest pain/pressure, no shortness of breath at rest.  No shortness of breath with exertion.  Denies history of atrial fibrillation or irregular heartbeat  Vascular: No history of rest pain in feet.  No history of  claudication.  No history of non-healing ulcer, No history of DVT   Pulmonary: No home oxygen, no productive cough, no hemoptysis,  No asthma or wheezing  Musculoskeletal:  [ ]  Arthritis, [ ]  Low back pain,  [ ]  Joint pain  Hematologic:No history of hypercoagulable state.  No history of easy bleeding.  No history of anemia  Gastrointestinal: No hematochezia or melena,  No gastroesophageal reflux, no trouble swallowing  Urinary: [ ]  chronic Kidney disease, [ x] on HD - [x ] MWF or [ ]  TTHS, [ ]  Burning with urination, [ ]  Frequent urination, [ ]  Difficulty urinating;   Skin: No rashes  Psychological: No history of anxiety,  No history of depression   Physical Examination  Filed Vitals:    01/21/14 1142  BP: 194/124  Pulse: 65  Height: 5\' 4"  (1.626 m)  Weight: 122 lb 6.4 oz (55.52 kg)  SpO2: 100%    Body mass index is 21 kg/(m^2).  General:  Alert and oriented, no acute distress HEENT: Normal Neck: No bruit or JVD Pulmonary: Clear to auscultation bilaterally Cardiac: Regular Rate and Rhythm without murmur Skin: No rash Extremity Pulses:  2+ radial, brachial pulses bilaterally, left upper arm loop graft palpable pulse week thrill Musculoskeletal: No deformity or edema  Neurologic: Upper and lower extremity motor 5/5 and symmetric  DATA: Patient had a vein mapping ultrasound today which shows the cephalic and basilic veins are quite small less than 2 mm right arm   ASSESSMENT: Right upper arm graft Tuesday, March 10. Risks benefits possible complications and procedure details explained to the patient today including but not limited to bleeding infection graft thrombosis ischemic steal she understands and agrees to proceed. I will decide on a loop graft versus a bridge graft at the time of operation based on the size of her brachial artery   PLAN: See above  Ruta Hinds, MD [facility] [place]: [phone] Pager: [phone]

## 2014-01-26 NOTE — Transfer of Care (Signed)
Immediate Anesthesia Transfer of Care Note  Patient: [name]  Procedure(s) Performed: Procedure(s): ARTERIOVENOUS (AV) FISTULA CREATION; ultrasound guided (Right)  Patient Location: PACU  Anesthesia Type:MAC  Level of Consciousness: awake, alert , oriented and patient cooperative  Airway & Oxygen Therapy: Patient Spontanous Breathing  Post-op Assessment: Report given to PACU RN, Post -op Vital signs reviewed and stable and Patient moving all extremities  Post vital signs: Reviewed and stable  Complications: No apparent anesthesia complications

## 2014-01-26 NOTE — Anesthesia Preprocedure Evaluation (Signed)
Anesthesia Evaluation  Patient identified by MRN, date of birth, ID band Patient awake    Reviewed: Allergy & Precautions, H&P , NPO status , Patient's Chart, lab work & pertinent test results, reviewed documented beta blocker date and time   Airway Mallampati: II TM Distance: >3 FB Neck ROM: Full    Dental no notable dental hx. (+) Teeth Intact, Poor Dentition, Dental Advisory Given   Pulmonary neg pulmonary ROS,  breath sounds clear to auscultation  Pulmonary exam normal       Cardiovascular hypertension, On Medications and On Home Beta Blockers Rhythm:Regular Rate:Normal     Neuro/Psych  Headaches, Lupus negative psych ROS   GI/Hepatic negative GI ROS, Neg liver ROS,   Endo/Other  negative endocrine ROS  Renal/GU ESRF and DialysisRenal disease  negative genitourinary   Musculoskeletal   Abdominal   Peds  Hematology negative hematology ROS (+) anemia ,   Anesthesia Other Findings   Reproductive/Obstetrics negative OB ROS                           Anesthesia Physical Anesthesia Plan  ASA: III  Anesthesia Plan: MAC   Post-op Pain Management:    Induction: Intravenous  Airway Management Planned: Simple Face Mask  Additional Equipment:   Intra-op Plan:   Post-operative Plan:   Informed Consent: I have reviewed the patients History and Physical, chart, labs and discussed the procedure including the risks, benefits and alternatives for the proposed anesthesia with the patient or authorized representative who has indicated his/her understanding and acceptance.   Dental advisory given  Plan Discussed with: CRNA  Anesthesia Plan Comments:         Anesthesia Quick Evaluation

## 2014-01-27 ENCOUNTER — Telehealth: Payer: Self-pay | Admitting: Vascular Surgery

## 2014-01-27 NOTE — Telephone Encounter (Addendum)
Message copied by Gena Fray on Wed Jan 27, 2014  3:25 PM ------      Message from: Mena Goes      Created: Tue Jan 26, 2014 11:54 AM      Regarding: Schedule                   ----- Message -----         From: Ulyses Amor, PA-C         Sent: 01/26/2014  11:42 AM           To: [facility] Charge Pool            F/U in office in 4 weeks with Dr. Bridgett Larsson s/p AV fistula creation ------  01/27/14: spoke with patient, dpm

## 2014-01-28 ENCOUNTER — Encounter (HOSPITAL_COMMUNITY): Payer: Self-pay | Admitting: Vascular Surgery

## 2014-02-16 DIAGNOSIS — N186 End stage renal disease: Secondary | ICD-10-CM | POA: Diagnosis not present

## 2014-02-17 DIAGNOSIS — D509 Iron deficiency anemia, unspecified: Secondary | ICD-10-CM | POA: Diagnosis not present

## 2014-02-17 DIAGNOSIS — N186 End stage renal disease: Secondary | ICD-10-CM | POA: Diagnosis not present

## 2014-02-17 DIAGNOSIS — N039 Chronic nephritic syndrome with unspecified morphologic changes: Secondary | ICD-10-CM | POA: Diagnosis not present

## 2014-02-17 DIAGNOSIS — D72829 Elevated white blood cell count, unspecified: Secondary | ICD-10-CM | POA: Diagnosis not present

## 2014-02-17 DIAGNOSIS — N2581 Secondary hyperparathyroidism of renal origin: Secondary | ICD-10-CM | POA: Diagnosis not present

## 2014-02-17 DIAGNOSIS — D631 Anemia in chronic kidney disease: Secondary | ICD-10-CM | POA: Diagnosis not present

## 2014-02-18 ENCOUNTER — Other Ambulatory Visit: Payer: Self-pay | Admitting: *Deleted

## 2014-02-18 DIAGNOSIS — N186 End stage renal disease: Secondary | ICD-10-CM

## 2014-02-18 DIAGNOSIS — Z4931 Encounter for adequacy testing for hemodialysis: Secondary | ICD-10-CM

## 2014-03-04 ENCOUNTER — Encounter: Payer: Self-pay | Admitting: Vascular Surgery

## 2014-03-05 ENCOUNTER — Other Ambulatory Visit (HOSPITAL_COMMUNITY): Payer: Self-pay

## 2014-03-05 ENCOUNTER — Encounter: Payer: Self-pay | Admitting: Vascular Surgery

## 2014-03-17 ENCOUNTER — Encounter: Payer: Self-pay | Admitting: Vascular Surgery

## 2014-03-18 ENCOUNTER — Ambulatory Visit (INDEPENDENT_AMBULATORY_CARE_PROVIDER_SITE_OTHER): Payer: Self-pay | Admitting: Vascular Surgery

## 2014-03-18 ENCOUNTER — Other Ambulatory Visit: Payer: Self-pay

## 2014-03-18 ENCOUNTER — Encounter: Payer: Self-pay | Admitting: Vascular Surgery

## 2014-03-18 ENCOUNTER — Ambulatory Visit (HOSPITAL_COMMUNITY)
Admission: RE | Admit: 2014-03-18 | Discharge: 2014-03-18 | Disposition: A | Discharge status: Home / Self Care | Payer: Medicare Other | Source: Ambulatory Visit | Attending: Vascular Surgery | Admitting: Vascular Surgery

## 2014-03-18 VITALS — BP 165/120 | HR 82 | Resp 18 | Ht 64.0 in | Wt 121.4 lb

## 2014-03-18 DIAGNOSIS — Z4931 Encounter for adequacy testing for hemodialysis: Secondary | ICD-10-CM

## 2014-03-18 DIAGNOSIS — N186 End stage renal disease: Secondary | ICD-10-CM

## 2014-03-18 NOTE — Progress Notes (Signed)
Patient is a 38 year old female who underwent first stage brachial vein to brachial artery fistula by Dr. Bridgett Larsson on March 10. The patient returns for followup today. She denies any numbness or tingling in her hand. She is currently dialyzing via a left upper arm graft. The right arm access was placed due to eminent failure of the left arm graft.  Physical exam:  Filed Vitals:   03/18/14 1242  BP: 165/120  Pulse: 82  Resp: 18  Height: 5\' 4"  (1.626 m)  Weight: 121 lb 6.4 oz (55.067 kg)    Left upper extremity: Left upper arm loop graft audible bruit  Right upper extremity: Healing antecubital incision audible bruit no palpable thrill  Data: Duplex ultrasound of the fistula was performed today. This shows a high-grade greater than 75% stenosis in the proximal vein.  Assessment: Poorly maturing brachial basilic fistula with proximal venous narrowing  Plan: Patient is scheduled for fistulogram possible intervention by Dr. Bridgett Larsson on May 7. If unable to access the fistula be too small size she may need to have a right arm AV graft.  Ruta Hinds, MD [facility] [place] Office: [phone] Pager: [phone]

## 2014-03-19 DIAGNOSIS — D631 Anemia in chronic kidney disease: Secondary | ICD-10-CM | POA: Diagnosis not present

## 2014-03-19 DIAGNOSIS — N186 End stage renal disease: Secondary | ICD-10-CM | POA: Diagnosis not present

## 2014-03-19 DIAGNOSIS — D509 Iron deficiency anemia, unspecified: Secondary | ICD-10-CM | POA: Diagnosis not present

## 2014-03-19 DIAGNOSIS — N2581 Secondary hyperparathyroidism of renal origin: Secondary | ICD-10-CM | POA: Diagnosis not present

## 2014-03-22 ENCOUNTER — Encounter (HOSPITAL_COMMUNITY): Payer: Self-pay | Admitting: Pharmacy Technician

## 2014-04-02 ENCOUNTER — Encounter: Payer: Self-pay | Admitting: Vascular Surgery

## 2014-04-02 ENCOUNTER — Other Ambulatory Visit (HOSPITAL_COMMUNITY): Payer: Self-pay

## 2014-04-08 ENCOUNTER — Encounter (HOSPITAL_COMMUNITY)
Admission: RE | Disposition: A | Discharge status: Home / Self Care | Payer: Self-pay | Source: Ambulatory Visit | Attending: Vascular Surgery

## 2014-04-08 ENCOUNTER — Telehealth: Payer: Self-pay | Admitting: Vascular Surgery

## 2014-04-08 ENCOUNTER — Ambulatory Visit (HOSPITAL_COMMUNITY)
Admission: RE | Admit: 2014-04-08 | Discharge: 2014-04-08 | Disposition: A | Discharge status: Home / Self Care | Payer: Medicare Other | Source: Ambulatory Visit | Attending: Vascular Surgery | Admitting: Vascular Surgery

## 2014-04-08 DIAGNOSIS — N186 End stage renal disease: Secondary | ICD-10-CM | POA: Diagnosis not present

## 2014-04-08 DIAGNOSIS — N2581 Secondary hyperparathyroidism of renal origin: Secondary | ICD-10-CM | POA: Diagnosis not present

## 2014-04-08 DIAGNOSIS — M329 Systemic lupus erythematosus, unspecified: Secondary | ICD-10-CM | POA: Insufficient documentation

## 2014-04-08 DIAGNOSIS — Z79899 Other long term (current) drug therapy: Secondary | ICD-10-CM | POA: Diagnosis not present

## 2014-04-08 DIAGNOSIS — T82898A Other specified complication of vascular prosthetic devices, implants and grafts, initial encounter: Secondary | ICD-10-CM

## 2014-04-08 DIAGNOSIS — IMO0002 Reserved for concepts with insufficient information to code with codable children: Secondary | ICD-10-CM | POA: Diagnosis not present

## 2014-04-08 DIAGNOSIS — D649 Anemia, unspecified: Secondary | ICD-10-CM | POA: Insufficient documentation

## 2014-04-08 DIAGNOSIS — Z992 Dependence on renal dialysis: Secondary | ICD-10-CM | POA: Insufficient documentation

## 2014-04-08 DIAGNOSIS — T82598A Other mechanical complication of other cardiac and vascular devices and implants, initial encounter: Secondary | ICD-10-CM | POA: Insufficient documentation

## 2014-04-08 DIAGNOSIS — Y832 Surgical operation with anastomosis, bypass or graft as the cause of abnormal reaction of the patient, or of later complication, without mention of misadventure at the time of the procedure: Secondary | ICD-10-CM | POA: Insufficient documentation

## 2014-04-08 DIAGNOSIS — E785 Hyperlipidemia, unspecified: Secondary | ICD-10-CM | POA: Insufficient documentation

## 2014-04-08 DIAGNOSIS — I120 Hypertensive chronic kidney disease with stage 5 chronic kidney disease or end stage renal disease: Secondary | ICD-10-CM | POA: Diagnosis not present

## 2014-04-08 LAB — PREGNANCY, URINE: PREG TEST UR: NEGATIVE

## 2014-04-08 SURGERY — VENOGRAM LOWER EXTREMITY RIGHT
Laterality: Right

## 2014-04-08 MED ORDER — HEPARIN (PORCINE) IN NACL 2-0.9 UNIT/ML-% IJ SOLN
INTRAMUSCULAR | Status: AC
  Filled 2014-04-08: qty 500

## 2014-04-08 MED ORDER — SODIUM CHLORIDE 0.9 % IV SOLN: 250.0000 mL | INTRAVENOUS | Status: DC | PRN

## 2014-04-08 MED ORDER — FENTANYL CITRATE 0.05 MG/ML IJ SOLN
INTRAMUSCULAR | Status: AC
  Filled 2014-04-08: qty 2

## 2014-04-08 MED ORDER — SODIUM CHLORIDE 0.9 % IJ SOLN: 3.0000 mL | Freq: Two times a day (BID) | INTRAMUSCULAR | Status: DC

## 2014-04-08 MED ORDER — LIDOCAINE HCL (PF) 1 % IJ SOLN
INTRAMUSCULAR | Status: AC
  Filled 2014-04-08: qty 30

## 2014-04-08 MED ORDER — SODIUM CHLORIDE 0.9 % IJ SOLN: 3.0000 mL | INTRAMUSCULAR | Status: DC | PRN

## 2014-04-08 MED ORDER — MIDAZOLAM HCL 2 MG/2ML IJ SOLN
INTRAMUSCULAR | Status: AC
  Filled 2014-04-08: qty 2

## 2014-04-08 NOTE — Discharge Instructions (Signed)
AV Fistula °Care After °Refer to this sheet in the next few weeks. These instructions provide you with information on caring for yourself after your procedure. Your caregiver may also give you more specific instructions. Your treatment has been planned according to current medical practices, but problems sometimes occur. Call your caregiver if you have any problems or questions after your procedure. °HOME CARE INSTRUCTIONS  °· Do not drive a car or take public transportation alone. °· Do not drink alcohol. °· Only take medicine that has been prescribed by your caregiver. °· Do not sign important papers or make important decisions. °· Have a responsible person with you. °· Ask your caregiver to show you how to check your access at home for a vibration (called a "thrill") or for a sound (called a "bruit" pronounced brew-ee). °· Your vein will need time to enlarge and mature so needles can be inserted for dialysis. Follow your caregiver's instructions about what you need to do to make this happen. °· Keep dressings clean and dry. °· Keep the arm elevated above your heart. Use a pillow. °· Rest. °· Use the arm as usual for all activities. °· Have the stitches or tape closures removed in 10 to 14 days, or as directed by your caregiver. °· Do not sleep or lie on the area of the fistula or that arm. This may decrease or stop the blood flow through your fistula. °· Do not allow blood pressures to be taken on this arm. °· Do not allow blood drawing to be done from the graft. °· Do not wear tight clothing around the access site or on the arm. °· Avoid lifting heavy objects with the arm that has the fistula. °· Do not use creams or lotions over the access site. °SEEK MEDICAL CARE IF:  °· You have a fever. °· You have swelling around the fistula that gets worse, or you have new pain. °· You have unusual bleeding at the fistula site or from any other area. °· You have pus or other drainage at the fistula site. °· You have skin  redness or red streaking on the skin around, above, or below the fistula site. °· Your access site feels warm. °· You have any flu-like symptoms. °SEEK IMMEDIATE MEDICAL CARE IF:  °· You have pain, numbness, or an unusual pale skin on the hand or on the side of your fistula. °· You have dizziness or weakness that you have not had before. °· You have shortness of breath. °· You have chest pain. °· Your fistula disconnects or breaks, and there is bleeding that cannot be easily controlled. °Call for local emergency medical help. Do not try to drive yourself to the hospital. °MAKE SURE YOU °· Understand these instructions. °· Will watch your condition. °· Will get help right away if you are not doing well or get worse. °Document Released: 11/05/2005 Document Revised: 01/28/2012 Document Reviewed: 04/25/2011 °[employer]® Patient Information ©2014 [employer]. ° °

## 2014-04-08 NOTE — Op Note (Signed)
    OPERATIVE NOTE   PROCEDURE: 1. Right brachial vein transposition under ultrasound guidance 2. Right cephalic vein cannulation under ultrasound guidance 3. Right basilic vein cannulation under ultrasound guidance 4. Right arm and central venogram  PRE-OPERATIVE DIAGNOSIS: Non-maturing right brachial vein transposition   POST-OPERATIVE DIAGNOSIS: same as above   SURGEON: Adele Barthel, MD  ANESTHESIA: local  ESTIMATED BLOOD LOSS: 5 cc  FINDING(S): 1. Distal brachial vein transposition with only 1-2 mm diameter with dynamically collapsing distal segment with occluding fistula: ruptured with wire passage which was controlled with pressure 2. Occluded distal cephalic vein segment: no venous return 3. Sclerotic basilic vein with minimal lumen 4. Patent high brachial vein compatible with distal target 5. High brachial artery bifurcation  SPECIMEN(S):  None  CONTRAST: 30 cc  INDICATIONS: [name] is a 38 y.o. female who presents with poorly maturing right brachial vein transposition.  The patient is scheduled for right arm fistulogram.  The patient is aware the risks include but are not limited to: bleeding, infection, thrombosis of the cannulated access, and possible anaphylactic reaction to the contrast.  The patient is aware of the risks of the procedure and elects to proceed forward.  DESCRIPTION: After full informed written consent was obtained, the patient was brought back to the angiography suite and placed supine upon the angiography table.  The patient was connected to monitoring equipment.  The right arm was prepped and draped in the standard fashion for a right arm fistulogram.  Under ultrasound guidance, the right brachial vein transposition was cannulated with a micropuncture needle.  Note, the distal segment of this fistula was extremely small with only 1-2 mm lumen.  The segment cannulated demonstrated dynamic collapse of the segment, suggesting the fistula was  in the process of collapsing.  The microwire was advanced partially and then buckled.  A hematoma began to form, suggesting rupture of this segment of the fistula.  I removed the wire and needle and held pressure for a few minutes.  Only a small hematoma resulted.  Under ultrasound, the fistula appeared to be collapsed.  At this point, I cannulated the forearm cephalic vein just distal to the antecubitum under Sonosite guidance.  The micro wire passed into the upper arm cephalic but after placing the microsheath, there was no backbleeding, suggesting the cephalic vein is occluded.  I pulled the wire and sheath and held pressure.  I then cannulated the antecubitum basilic vein and passed the microwire into this vein.  I exchanged the needle for the microsheath.  There was acceptable backbleeding from which I obtained a Chem-8 sample.  The sheath was connected to the IV extension tubing.  Hand injections were completed to image the access from the antecubitum up to the level of axilla.  The central venous structures were also imaged by hand injections.  Based on the images, this patient will need: upper arm arteriovenous graft (straight vs looped configuration depending on adequacy of brachial artery due to high bifurcation).  The sheath was removed and pressure held.  Sterile bandages were applied to the venous puncture sites.  COMPLICATIONS: small antecubital hematoma  CONDITION: stable  Adele Barthel, MD [facility] Office: [phone] Pager: [phone]  04/08/2014 8:47 AM

## 2014-04-08 NOTE — Telephone Encounter (Addendum)
Message copied by Doristine Section on Thu Apr 08, 2014  4:00 PM ------      Message from: [place], [place] K      Created: Thu Apr 08, 2014  9:03 AM      Regarding: Schedule                   ----- Message -----         From: Conrad [place], MD         Sent: 04/08/2014   8:59 AM           To: [address]            SHARECE SMALLS      [id number]      1976/04/12            PROCEDURE:      Right brachial vein transposition under ultrasound guidance      Right cephalic vein cannulation under ultrasound guidance      Right basilic vein cannulation under ultrasound guidance      Right arm and central venogram            Follow-up: Dr. Oneida Alar in 2-4 weeks  notified patient of post op visit on 05-13-14 at 8:30 with dr. Bridgett Larsson ------

## 2014-04-08 NOTE — H&P (Addendum)
Brief History and Physical  History of Present Illness  [name] is a 38 y.o. female who presents with chief complaint: poorly maturing R 1st stage BRVT.  The patient presents today for R arm venogram vs fistulogram.    Past Medical History  Diagnosis Date  . Hypertension   . Lupus   . Hyperlipemia   . Anemia   . Secondary hyperparathyroidism (of renal origin)   . 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 [id number]     Past Surgical History  Procedure Laterality Date  . Av fistula placement Left   . Incision and drainage abscess      gluteal  . Tibiocalcaneal fusion  left    . Hematoma evacuation Left 10/09/2013    Procedure: EVACUATION HEMATOMA;  Surgeon: Mary Mould, MD;  Location: [facility];  Service: Vascular;  Laterality: Left;  . Insertion of dialysis catheter N/A 10/09/2013    Procedure: INSERTION OF DIALYSIS CATHETER; ULTRASOUND GUIDED;  Surgeon: Mary Mould, MD;  Location: [facility];  Service: Vascular;  Laterality: N/A;  . Av fistula placement Right 01/26/2014    Procedure: ARTERIOVENOUS (AV) FISTULA CREATION; ultrasound guided;  Surgeon: Mary [place], MD;  Location: [facility];  Service: Vascular;  Laterality: Right;    History   Social History  . Marital Status: Single    Spouse Name: N/A    Number of Children: N/A  . Years of Education: N/A   Occupational History  . Not on file.   Social History Main Topics  . Smoking status: Never Smoker   . Smokeless tobacco: Never Used  . Alcohol Use: No  . Drug Use: No  . Sexual Activity: Not on file   Other Topics Concern  . Not on file   Social History Narrative  . No narrative on file    Family History  Problem Relation Age of Onset  . Asthma Sister   . Hypertension Mother   . Heart attack Mother     No  current facility-administered medications on file prior to encounter.   Current Outpatient Prescriptions on File Prior to Encounter  Medication Sig Dispense Refill  . calcium acetate (PHOSLO) 667 MG capsule Take 1,334 mg by mouth 3 (three) times daily with meals.       . furosemide (LASIX) 80 MG tablet Take 80 mg by mouth daily.       . hydroxychloroquine (PLAQUENIL) 200 MG tablet Take 200 mg by mouth 2 (two) times daily.      Mary Kitchen labetalol (NORMODYNE) 300 MG tablet Take 300 mg by mouth 2 (two) times daily.      Mary Kitchen lisinopril (PRINIVIL,ZESTRIL) 40 MG tablet Take 40 mg by mouth daily.      . predniSONE (DELTASONE) 10 MG tablet Take 10 mg by mouth daily with breakfast.      . traZODone (DESYREL) 50 MG tablet Take 25 mg by mouth at bedtime.         Allergies  Allergen Reactions  . Amlodipine Hives    Swelling  . Sulfa Antibiotics Hives and Itching  . Vancomycin Hives and Itching    Review of Systems: As listed above, otherwise negative.  Physical Examination  Filed Vitals:   04/08/14 0609  BP: 166/86  Pulse: 82  Temp: 98.4 F (36.9 C)  TempSrc: Oral  Resp: 20  Height: 5' 3.5" (1.613 m)  Weight: 120 lb (54.432 kg)  SpO2: 100%    General: A&O x 3, WDWN  Pulmonary: Sym exp, good air movt, CTAB, no rales, rhonchi, & wheezing  Cardiac: RRR, Nl S1, S2, no Murmurs, rubs or gallops  Gastrointestinal: soft, NTND, -G/R, - HSM, - masses, - CVAT B  Musculoskeletal: M/S 5/5 throughout , Extremities without ischemic changes , palpable thrill with bruit in R antecubital  Laboratory See [place]wood is a 38 y.o. female who presents with: poorly maturing R 1st BRVT.   The patient is scheduled for: R arm fistulogram vs. Venogram. I discussed with the patient the nature of angiographic procedures, especially the limited patencies of any endovascular intervention.  The patient is aware of that the risks of an angiographic procedure include but are  not limited to: bleeding, infection, access site complications, renal failure, embolization, rupture of vessel, dissection, possible need for emergent surgical intervention, possible need for surgical procedures to treat the patient's pathology, and stroke and death.    The patient is aware of the risks and agrees to proceed.  Mary Barthel, MD [facility] Office: [phone] Pager: [phone]  04/08/2014, 7:24 AM

## 2014-04-13 DIAGNOSIS — Z79899 Other long term (current) drug therapy: Secondary | ICD-10-CM | POA: Diagnosis not present

## 2014-04-18 DIAGNOSIS — N186 End stage renal disease: Secondary | ICD-10-CM | POA: Diagnosis not present

## 2014-04-19 DIAGNOSIS — D631 Anemia in chronic kidney disease: Secondary | ICD-10-CM | POA: Diagnosis not present

## 2014-04-19 DIAGNOSIS — N186 End stage renal disease: Secondary | ICD-10-CM | POA: Diagnosis not present

## 2014-04-19 DIAGNOSIS — R509 Fever, unspecified: Secondary | ICD-10-CM | POA: Diagnosis not present

## 2014-04-19 DIAGNOSIS — R6883 Chills (without fever): Secondary | ICD-10-CM | POA: Diagnosis not present

## 2014-04-19 DIAGNOSIS — D509 Iron deficiency anemia, unspecified: Secondary | ICD-10-CM | POA: Diagnosis not present

## 2014-04-19 DIAGNOSIS — N039 Chronic nephritic syndrome with unspecified morphologic changes: Secondary | ICD-10-CM | POA: Diagnosis not present

## 2014-04-21 DIAGNOSIS — R6883 Chills (without fever): Secondary | ICD-10-CM | POA: Diagnosis not present

## 2014-04-21 DIAGNOSIS — R509 Fever, unspecified: Secondary | ICD-10-CM | POA: Diagnosis not present

## 2014-04-21 DIAGNOSIS — D631 Anemia in chronic kidney disease: Secondary | ICD-10-CM | POA: Diagnosis not present

## 2014-04-21 DIAGNOSIS — D509 Iron deficiency anemia, unspecified: Secondary | ICD-10-CM | POA: Diagnosis not present

## 2014-04-21 DIAGNOSIS — N186 End stage renal disease: Secondary | ICD-10-CM | POA: Diagnosis not present

## 2014-04-23 DIAGNOSIS — N186 End stage renal disease: Secondary | ICD-10-CM | POA: Diagnosis not present

## 2014-04-23 DIAGNOSIS — D631 Anemia in chronic kidney disease: Secondary | ICD-10-CM | POA: Diagnosis not present

## 2014-04-23 DIAGNOSIS — N039 Chronic nephritic syndrome with unspecified morphologic changes: Secondary | ICD-10-CM | POA: Diagnosis not present

## 2014-04-23 DIAGNOSIS — R509 Fever, unspecified: Secondary | ICD-10-CM | POA: Diagnosis not present

## 2014-04-23 DIAGNOSIS — D509 Iron deficiency anemia, unspecified: Secondary | ICD-10-CM | POA: Diagnosis not present

## 2014-04-23 DIAGNOSIS — R6883 Chills (without fever): Secondary | ICD-10-CM | POA: Diagnosis not present

## 2014-04-26 DIAGNOSIS — D509 Iron deficiency anemia, unspecified: Secondary | ICD-10-CM | POA: Diagnosis not present

## 2014-04-26 DIAGNOSIS — R6883 Chills (without fever): Secondary | ICD-10-CM | POA: Diagnosis not present

## 2014-04-26 DIAGNOSIS — N039 Chronic nephritic syndrome with unspecified morphologic changes: Secondary | ICD-10-CM | POA: Diagnosis not present

## 2014-04-26 DIAGNOSIS — D631 Anemia in chronic kidney disease: Secondary | ICD-10-CM | POA: Diagnosis not present

## 2014-04-26 DIAGNOSIS — N186 End stage renal disease: Secondary | ICD-10-CM | POA: Diagnosis not present

## 2014-04-26 DIAGNOSIS — R509 Fever, unspecified: Secondary | ICD-10-CM | POA: Diagnosis not present

## 2014-04-28 DIAGNOSIS — R6883 Chills (without fever): Secondary | ICD-10-CM | POA: Diagnosis not present

## 2014-04-28 DIAGNOSIS — N039 Chronic nephritic syndrome with unspecified morphologic changes: Secondary | ICD-10-CM | POA: Diagnosis not present

## 2014-04-28 DIAGNOSIS — D509 Iron deficiency anemia, unspecified: Secondary | ICD-10-CM | POA: Diagnosis not present

## 2014-04-28 DIAGNOSIS — D631 Anemia in chronic kidney disease: Secondary | ICD-10-CM | POA: Diagnosis not present

## 2014-04-28 DIAGNOSIS — N186 End stage renal disease: Secondary | ICD-10-CM | POA: Diagnosis not present

## 2014-04-28 DIAGNOSIS — R509 Fever, unspecified: Secondary | ICD-10-CM | POA: Diagnosis not present

## 2014-04-30 DIAGNOSIS — D631 Anemia in chronic kidney disease: Secondary | ICD-10-CM | POA: Diagnosis not present

## 2014-04-30 DIAGNOSIS — D509 Iron deficiency anemia, unspecified: Secondary | ICD-10-CM | POA: Diagnosis not present

## 2014-04-30 DIAGNOSIS — R509 Fever, unspecified: Secondary | ICD-10-CM | POA: Diagnosis not present

## 2014-04-30 DIAGNOSIS — R6883 Chills (without fever): Secondary | ICD-10-CM | POA: Diagnosis not present

## 2014-04-30 DIAGNOSIS — N186 End stage renal disease: Secondary | ICD-10-CM | POA: Diagnosis not present

## 2014-05-03 DIAGNOSIS — R6883 Chills (without fever): Secondary | ICD-10-CM | POA: Diagnosis not present

## 2014-05-03 DIAGNOSIS — N039 Chronic nephritic syndrome with unspecified morphologic changes: Secondary | ICD-10-CM | POA: Diagnosis not present

## 2014-05-03 DIAGNOSIS — D509 Iron deficiency anemia, unspecified: Secondary | ICD-10-CM | POA: Diagnosis not present

## 2014-05-03 DIAGNOSIS — N186 End stage renal disease: Secondary | ICD-10-CM | POA: Diagnosis not present

## 2014-05-03 DIAGNOSIS — R509 Fever, unspecified: Secondary | ICD-10-CM | POA: Diagnosis not present

## 2014-05-03 DIAGNOSIS — D631 Anemia in chronic kidney disease: Secondary | ICD-10-CM | POA: Diagnosis not present

## 2014-05-05 DIAGNOSIS — R509 Fever, unspecified: Secondary | ICD-10-CM | POA: Diagnosis not present

## 2014-05-05 DIAGNOSIS — N186 End stage renal disease: Secondary | ICD-10-CM | POA: Diagnosis not present

## 2014-05-05 DIAGNOSIS — D631 Anemia in chronic kidney disease: Secondary | ICD-10-CM | POA: Diagnosis not present

## 2014-05-05 DIAGNOSIS — R6883 Chills (without fever): Secondary | ICD-10-CM | POA: Diagnosis not present

## 2014-05-05 DIAGNOSIS — D509 Iron deficiency anemia, unspecified: Secondary | ICD-10-CM | POA: Diagnosis not present

## 2014-05-07 DIAGNOSIS — R6883 Chills (without fever): Secondary | ICD-10-CM | POA: Diagnosis not present

## 2014-05-07 DIAGNOSIS — D509 Iron deficiency anemia, unspecified: Secondary | ICD-10-CM | POA: Diagnosis not present

## 2014-05-07 DIAGNOSIS — R509 Fever, unspecified: Secondary | ICD-10-CM | POA: Diagnosis not present

## 2014-05-07 DIAGNOSIS — D631 Anemia in chronic kidney disease: Secondary | ICD-10-CM | POA: Diagnosis not present

## 2014-05-07 DIAGNOSIS — N186 End stage renal disease: Secondary | ICD-10-CM | POA: Diagnosis not present

## 2014-05-10 DIAGNOSIS — D509 Iron deficiency anemia, unspecified: Secondary | ICD-10-CM | POA: Diagnosis not present

## 2014-05-10 DIAGNOSIS — D631 Anemia in chronic kidney disease: Secondary | ICD-10-CM | POA: Diagnosis not present

## 2014-05-10 DIAGNOSIS — R6883 Chills (without fever): Secondary | ICD-10-CM | POA: Diagnosis not present

## 2014-05-10 DIAGNOSIS — N186 End stage renal disease: Secondary | ICD-10-CM | POA: Diagnosis not present

## 2014-05-10 DIAGNOSIS — R509 Fever, unspecified: Secondary | ICD-10-CM | POA: Diagnosis not present

## 2014-05-12 ENCOUNTER — Encounter: Payer: Self-pay | Admitting: Vascular Surgery

## 2014-05-12 DIAGNOSIS — D509 Iron deficiency anemia, unspecified: Secondary | ICD-10-CM | POA: Diagnosis not present

## 2014-05-12 DIAGNOSIS — R509 Fever, unspecified: Secondary | ICD-10-CM | POA: Diagnosis not present

## 2014-05-12 DIAGNOSIS — R6883 Chills (without fever): Secondary | ICD-10-CM | POA: Diagnosis not present

## 2014-05-12 DIAGNOSIS — N186 End stage renal disease: Secondary | ICD-10-CM | POA: Diagnosis not present

## 2014-05-12 DIAGNOSIS — D631 Anemia in chronic kidney disease: Secondary | ICD-10-CM | POA: Diagnosis not present

## 2014-05-13 ENCOUNTER — Encounter: Payer: Self-pay | Admitting: Vascular Surgery

## 2014-05-14 DIAGNOSIS — D509 Iron deficiency anemia, unspecified: Secondary | ICD-10-CM | POA: Diagnosis not present

## 2014-05-14 DIAGNOSIS — D631 Anemia in chronic kidney disease: Secondary | ICD-10-CM | POA: Diagnosis not present

## 2014-05-14 DIAGNOSIS — R6883 Chills (without fever): Secondary | ICD-10-CM | POA: Diagnosis not present

## 2014-05-14 DIAGNOSIS — N186 End stage renal disease: Secondary | ICD-10-CM | POA: Diagnosis not present

## 2014-05-14 DIAGNOSIS — R509 Fever, unspecified: Secondary | ICD-10-CM | POA: Diagnosis not present

## 2014-05-17 DIAGNOSIS — D509 Iron deficiency anemia, unspecified: Secondary | ICD-10-CM | POA: Diagnosis not present

## 2014-05-17 DIAGNOSIS — N186 End stage renal disease: Secondary | ICD-10-CM | POA: Diagnosis not present

## 2014-05-17 DIAGNOSIS — R6883 Chills (without fever): Secondary | ICD-10-CM | POA: Diagnosis not present

## 2014-05-17 DIAGNOSIS — D631 Anemia in chronic kidney disease: Secondary | ICD-10-CM | POA: Diagnosis not present

## 2014-05-17 DIAGNOSIS — R509 Fever, unspecified: Secondary | ICD-10-CM | POA: Diagnosis not present

## 2014-05-18 DIAGNOSIS — N186 End stage renal disease: Secondary | ICD-10-CM | POA: Diagnosis not present

## 2014-05-19 ENCOUNTER — Encounter: Payer: Self-pay | Admitting: Vascular Surgery

## 2014-05-19 DIAGNOSIS — D631 Anemia in chronic kidney disease: Secondary | ICD-10-CM | POA: Diagnosis not present

## 2014-05-19 DIAGNOSIS — D509 Iron deficiency anemia, unspecified: Secondary | ICD-10-CM | POA: Diagnosis not present

## 2014-05-19 DIAGNOSIS — N186 End stage renal disease: Secondary | ICD-10-CM | POA: Diagnosis not present

## 2014-05-19 DIAGNOSIS — N2581 Secondary hyperparathyroidism of renal origin: Secondary | ICD-10-CM | POA: Diagnosis not present

## 2014-05-20 ENCOUNTER — Encounter: Payer: Self-pay | Admitting: Vascular Surgery

## 2014-05-20 ENCOUNTER — Ambulatory Visit (INDEPENDENT_AMBULATORY_CARE_PROVIDER_SITE_OTHER): Payer: Medicare Other | Admitting: Vascular Surgery

## 2014-05-20 VITALS — BP 182/94 | HR 78 | Ht 63.0 in | Wt 118.6 lb

## 2014-05-20 DIAGNOSIS — N186 End stage renal disease: Secondary | ICD-10-CM | POA: Diagnosis not present

## 2014-05-20 NOTE — Progress Notes (Signed)
Patient is a 38 year old female who currently has a functioning left upper arm graft which is slowly failing. She recently had an attempt at a new fistula in the right arm. The vein is small in the fistula is not going to develop. Recent fistulogram confirmed this. She presents today for consideration of the hemodialysis access.  Physical exam:  Filed Vitals:   05/20/14 0850  BP: 182/94  Pulse: 78  Height: 5\' 3"  (1.6 m)  Weight: 118 lb 9.6 oz (53.797 kg)  SpO2: 100%    Upper extremities: Right upper extremity 2+ brachial and radial pulse no palpable thrill in fistula, left upper extremity palpable thrill left upper arm loop graft hand well-perfused bilaterally  Data: Her fistulogram was reviewed today. This shows a diffusely small vein  Assessment: Patient has a functioning graft left upper extremity. She is not going to be a candidate for a fistula in the right arm as attempts on this side have not been successful. Our only option at this point would be to place a right arm AV graft. I am somewhat reluctant to place this currently since she has a functioning access in place and would not like to be in a situation where we are maintaining a graft that we are not using. I discussed this with the patient today as far as options of going ahead and proceeding with placing a graft now or waiting until her graft occludes in the left arm and then placing a catheter in graft. She is reluctant to have an additional catheter placed. She is going to think about these options and call me if she wishes to have a graft placed in the near future.  Ruta Hinds, MD [facility] Office: [phone] Pager: [phone]

## 2014-06-18 DIAGNOSIS — N186 End stage renal disease: Secondary | ICD-10-CM | POA: Diagnosis not present

## 2014-06-21 DIAGNOSIS — N2581 Secondary hyperparathyroidism of renal origin: Secondary | ICD-10-CM | POA: Diagnosis not present

## 2014-06-21 DIAGNOSIS — D509 Iron deficiency anemia, unspecified: Secondary | ICD-10-CM | POA: Diagnosis not present

## 2014-06-21 DIAGNOSIS — D631 Anemia in chronic kidney disease: Secondary | ICD-10-CM | POA: Diagnosis not present

## 2014-06-21 DIAGNOSIS — N186 End stage renal disease: Secondary | ICD-10-CM | POA: Diagnosis not present

## 2014-06-22 DIAGNOSIS — T82898A Other specified complication of vascular prosthetic devices, implants and grafts, initial encounter: Secondary | ICD-10-CM | POA: Diagnosis not present

## 2014-06-22 DIAGNOSIS — I871 Compression of vein: Secondary | ICD-10-CM | POA: Diagnosis not present

## 2014-06-22 DIAGNOSIS — N186 End stage renal disease: Secondary | ICD-10-CM | POA: Diagnosis not present

## 2014-07-19 DIAGNOSIS — N186 End stage renal disease: Secondary | ICD-10-CM | POA: Diagnosis not present

## 2014-07-21 DIAGNOSIS — N039 Chronic nephritic syndrome with unspecified morphologic changes: Secondary | ICD-10-CM | POA: Diagnosis not present

## 2014-07-21 DIAGNOSIS — D631 Anemia in chronic kidney disease: Secondary | ICD-10-CM | POA: Diagnosis not present

## 2014-07-21 DIAGNOSIS — D509 Iron deficiency anemia, unspecified: Secondary | ICD-10-CM | POA: Diagnosis not present

## 2014-07-21 DIAGNOSIS — N186 End stage renal disease: Secondary | ICD-10-CM | POA: Diagnosis not present

## 2014-07-21 DIAGNOSIS — N2581 Secondary hyperparathyroidism of renal origin: Secondary | ICD-10-CM | POA: Diagnosis not present

## 2014-08-18 DIAGNOSIS — N186 End stage renal disease: Secondary | ICD-10-CM | POA: Diagnosis not present

## 2014-08-20 DIAGNOSIS — N2581 Secondary hyperparathyroidism of renal origin: Secondary | ICD-10-CM | POA: Diagnosis not present

## 2014-08-20 DIAGNOSIS — D631 Anemia in chronic kidney disease: Secondary | ICD-10-CM | POA: Diagnosis not present

## 2014-08-20 DIAGNOSIS — D509 Iron deficiency anemia, unspecified: Secondary | ICD-10-CM | POA: Diagnosis not present

## 2014-08-20 DIAGNOSIS — Z23 Encounter for immunization: Secondary | ICD-10-CM | POA: Diagnosis not present

## 2014-08-20 DIAGNOSIS — N186 End stage renal disease: Secondary | ICD-10-CM | POA: Diagnosis not present

## 2014-08-26 DIAGNOSIS — I871 Compression of vein: Secondary | ICD-10-CM | POA: Diagnosis not present

## 2014-08-26 DIAGNOSIS — N186 End stage renal disease: Secondary | ICD-10-CM | POA: Diagnosis not present

## 2014-08-26 DIAGNOSIS — T82858A Stenosis of vascular prosthetic devices, implants and grafts, initial encounter: Secondary | ICD-10-CM | POA: Diagnosis not present

## 2014-09-18 DIAGNOSIS — N186 End stage renal disease: Secondary | ICD-10-CM | POA: Diagnosis not present

## 2014-09-18 DIAGNOSIS — Z992 Dependence on renal dialysis: Secondary | ICD-10-CM | POA: Diagnosis not present

## 2014-09-20 DIAGNOSIS — N186 End stage renal disease: Secondary | ICD-10-CM | POA: Diagnosis not present

## 2014-09-20 DIAGNOSIS — N2581 Secondary hyperparathyroidism of renal origin: Secondary | ICD-10-CM | POA: Diagnosis not present

## 2014-09-20 DIAGNOSIS — D631 Anemia in chronic kidney disease: Secondary | ICD-10-CM | POA: Diagnosis not present

## 2014-09-20 DIAGNOSIS — D509 Iron deficiency anemia, unspecified: Secondary | ICD-10-CM | POA: Diagnosis not present

## 2014-10-18 DIAGNOSIS — N186 End stage renal disease: Secondary | ICD-10-CM | POA: Diagnosis not present

## 2014-10-18 DIAGNOSIS — Z992 Dependence on renal dialysis: Secondary | ICD-10-CM | POA: Diagnosis not present

## 2014-10-20 DIAGNOSIS — N2581 Secondary hyperparathyroidism of renal origin: Secondary | ICD-10-CM | POA: Diagnosis not present

## 2014-10-20 DIAGNOSIS — D509 Iron deficiency anemia, unspecified: Secondary | ICD-10-CM | POA: Diagnosis not present

## 2014-10-20 DIAGNOSIS — D631 Anemia in chronic kidney disease: Secondary | ICD-10-CM | POA: Diagnosis not present

## 2014-10-20 DIAGNOSIS — N186 End stage renal disease: Secondary | ICD-10-CM | POA: Diagnosis not present

## 2014-10-28 ENCOUNTER — Encounter (HOSPITAL_COMMUNITY): Payer: Self-pay | Admitting: Vascular Surgery

## 2014-11-18 DIAGNOSIS — Z992 Dependence on renal dialysis: Secondary | ICD-10-CM | POA: Diagnosis not present

## 2014-11-18 DIAGNOSIS — N186 End stage renal disease: Secondary | ICD-10-CM | POA: Diagnosis not present

## 2014-11-20 DIAGNOSIS — N186 End stage renal disease: Secondary | ICD-10-CM | POA: Diagnosis not present

## 2014-11-20 DIAGNOSIS — D631 Anemia in chronic kidney disease: Secondary | ICD-10-CM | POA: Diagnosis not present

## 2014-11-22 DIAGNOSIS — D631 Anemia in chronic kidney disease: Secondary | ICD-10-CM | POA: Diagnosis not present

## 2014-11-22 DIAGNOSIS — N186 End stage renal disease: Secondary | ICD-10-CM | POA: Diagnosis not present

## 2014-11-24 DIAGNOSIS — D631 Anemia in chronic kidney disease: Secondary | ICD-10-CM | POA: Diagnosis not present

## 2014-11-24 DIAGNOSIS — N186 End stage renal disease: Secondary | ICD-10-CM | POA: Diagnosis not present

## 2014-11-26 DIAGNOSIS — N186 End stage renal disease: Secondary | ICD-10-CM | POA: Diagnosis not present

## 2014-11-26 DIAGNOSIS — D631 Anemia in chronic kidney disease: Secondary | ICD-10-CM | POA: Diagnosis not present

## 2014-11-29 DIAGNOSIS — D631 Anemia in chronic kidney disease: Secondary | ICD-10-CM | POA: Diagnosis not present

## 2014-11-29 DIAGNOSIS — N186 End stage renal disease: Secondary | ICD-10-CM | POA: Diagnosis not present

## 2014-12-01 DIAGNOSIS — D631 Anemia in chronic kidney disease: Secondary | ICD-10-CM | POA: Diagnosis not present

## 2014-12-01 DIAGNOSIS — N186 End stage renal disease: Secondary | ICD-10-CM | POA: Diagnosis not present

## 2014-12-03 DIAGNOSIS — N186 End stage renal disease: Secondary | ICD-10-CM | POA: Diagnosis not present

## 2014-12-03 DIAGNOSIS — D631 Anemia in chronic kidney disease: Secondary | ICD-10-CM | POA: Diagnosis not present

## 2014-12-06 DIAGNOSIS — D631 Anemia in chronic kidney disease: Secondary | ICD-10-CM | POA: Diagnosis not present

## 2014-12-06 DIAGNOSIS — N186 End stage renal disease: Secondary | ICD-10-CM | POA: Diagnosis not present

## 2014-12-08 DIAGNOSIS — D631 Anemia in chronic kidney disease: Secondary | ICD-10-CM | POA: Diagnosis not present

## 2014-12-08 DIAGNOSIS — N186 End stage renal disease: Secondary | ICD-10-CM | POA: Diagnosis not present

## 2014-12-10 DIAGNOSIS — D631 Anemia in chronic kidney disease: Secondary | ICD-10-CM | POA: Diagnosis not present

## 2014-12-10 DIAGNOSIS — N186 End stage renal disease: Secondary | ICD-10-CM | POA: Diagnosis not present

## 2014-12-13 DIAGNOSIS — N186 End stage renal disease: Secondary | ICD-10-CM | POA: Diagnosis not present

## 2014-12-13 DIAGNOSIS — D631 Anemia in chronic kidney disease: Secondary | ICD-10-CM | POA: Diagnosis not present

## 2014-12-15 DIAGNOSIS — D631 Anemia in chronic kidney disease: Secondary | ICD-10-CM | POA: Diagnosis not present

## 2014-12-15 DIAGNOSIS — N186 End stage renal disease: Secondary | ICD-10-CM | POA: Diagnosis not present

## 2014-12-17 DIAGNOSIS — D631 Anemia in chronic kidney disease: Secondary | ICD-10-CM | POA: Diagnosis not present

## 2014-12-17 DIAGNOSIS — N186 End stage renal disease: Secondary | ICD-10-CM | POA: Diagnosis not present

## 2014-12-19 DIAGNOSIS — N186 End stage renal disease: Secondary | ICD-10-CM | POA: Diagnosis not present

## 2014-12-19 DIAGNOSIS — Z992 Dependence on renal dialysis: Secondary | ICD-10-CM | POA: Diagnosis not present

## 2014-12-20 DIAGNOSIS — N186 End stage renal disease: Secondary | ICD-10-CM | POA: Diagnosis not present

## 2014-12-20 DIAGNOSIS — D631 Anemia in chronic kidney disease: Secondary | ICD-10-CM | POA: Diagnosis not present

## 2014-12-22 DIAGNOSIS — N186 End stage renal disease: Secondary | ICD-10-CM | POA: Diagnosis not present

## 2014-12-22 DIAGNOSIS — D631 Anemia in chronic kidney disease: Secondary | ICD-10-CM | POA: Diagnosis not present

## 2014-12-24 DIAGNOSIS — N186 End stage renal disease: Secondary | ICD-10-CM | POA: Diagnosis not present

## 2014-12-24 DIAGNOSIS — D631 Anemia in chronic kidney disease: Secondary | ICD-10-CM | POA: Diagnosis not present

## 2014-12-27 DIAGNOSIS — N186 End stage renal disease: Secondary | ICD-10-CM | POA: Diagnosis not present

## 2014-12-27 DIAGNOSIS — D631 Anemia in chronic kidney disease: Secondary | ICD-10-CM | POA: Diagnosis not present

## 2014-12-29 DIAGNOSIS — N186 End stage renal disease: Secondary | ICD-10-CM | POA: Diagnosis not present

## 2014-12-29 DIAGNOSIS — D631 Anemia in chronic kidney disease: Secondary | ICD-10-CM | POA: Diagnosis not present

## 2014-12-31 DIAGNOSIS — D631 Anemia in chronic kidney disease: Secondary | ICD-10-CM | POA: Diagnosis not present

## 2014-12-31 DIAGNOSIS — N186 End stage renal disease: Secondary | ICD-10-CM | POA: Diagnosis not present

## 2015-01-03 DIAGNOSIS — D631 Anemia in chronic kidney disease: Secondary | ICD-10-CM | POA: Diagnosis not present

## 2015-01-03 DIAGNOSIS — N186 End stage renal disease: Secondary | ICD-10-CM | POA: Diagnosis not present

## 2015-01-05 DIAGNOSIS — I871 Compression of vein: Secondary | ICD-10-CM | POA: Diagnosis not present

## 2015-01-05 DIAGNOSIS — Z992 Dependence on renal dialysis: Secondary | ICD-10-CM | POA: Diagnosis not present

## 2015-01-05 DIAGNOSIS — D631 Anemia in chronic kidney disease: Secondary | ICD-10-CM | POA: Diagnosis not present

## 2015-01-05 DIAGNOSIS — N186 End stage renal disease: Secondary | ICD-10-CM | POA: Diagnosis not present

## 2015-01-05 DIAGNOSIS — T82858D Stenosis of vascular prosthetic devices, implants and grafts, subsequent encounter: Secondary | ICD-10-CM | POA: Diagnosis not present

## 2015-01-07 DIAGNOSIS — D631 Anemia in chronic kidney disease: Secondary | ICD-10-CM | POA: Diagnosis not present

## 2015-01-07 DIAGNOSIS — N186 End stage renal disease: Secondary | ICD-10-CM | POA: Diagnosis not present

## 2015-01-10 DIAGNOSIS — N186 End stage renal disease: Secondary | ICD-10-CM | POA: Diagnosis not present

## 2015-01-10 DIAGNOSIS — D631 Anemia in chronic kidney disease: Secondary | ICD-10-CM | POA: Diagnosis not present

## 2015-01-12 DIAGNOSIS — N186 End stage renal disease: Secondary | ICD-10-CM | POA: Diagnosis not present

## 2015-01-12 DIAGNOSIS — D631 Anemia in chronic kidney disease: Secondary | ICD-10-CM | POA: Diagnosis not present

## 2015-01-14 DIAGNOSIS — D631 Anemia in chronic kidney disease: Secondary | ICD-10-CM | POA: Diagnosis not present

## 2015-01-14 DIAGNOSIS — N186 End stage renal disease: Secondary | ICD-10-CM | POA: Diagnosis not present

## 2015-01-17 DIAGNOSIS — N186 End stage renal disease: Secondary | ICD-10-CM | POA: Diagnosis not present

## 2015-01-17 DIAGNOSIS — Z992 Dependence on renal dialysis: Secondary | ICD-10-CM | POA: Diagnosis not present

## 2015-01-17 DIAGNOSIS — D631 Anemia in chronic kidney disease: Secondary | ICD-10-CM | POA: Diagnosis not present

## 2015-01-19 DIAGNOSIS — D631 Anemia in chronic kidney disease: Secondary | ICD-10-CM | POA: Diagnosis not present

## 2015-01-19 DIAGNOSIS — N2581 Secondary hyperparathyroidism of renal origin: Secondary | ICD-10-CM | POA: Diagnosis not present

## 2015-01-19 DIAGNOSIS — N186 End stage renal disease: Secondary | ICD-10-CM | POA: Diagnosis not present

## 2015-01-21 DIAGNOSIS — N2581 Secondary hyperparathyroidism of renal origin: Secondary | ICD-10-CM | POA: Diagnosis not present

## 2015-01-21 DIAGNOSIS — D631 Anemia in chronic kidney disease: Secondary | ICD-10-CM | POA: Diagnosis not present

## 2015-01-21 DIAGNOSIS — N186 End stage renal disease: Secondary | ICD-10-CM | POA: Diagnosis not present

## 2015-01-24 DIAGNOSIS — N2581 Secondary hyperparathyroidism of renal origin: Secondary | ICD-10-CM | POA: Diagnosis not present

## 2015-01-24 DIAGNOSIS — N186 End stage renal disease: Secondary | ICD-10-CM | POA: Diagnosis not present

## 2015-01-24 DIAGNOSIS — D631 Anemia in chronic kidney disease: Secondary | ICD-10-CM | POA: Diagnosis not present

## 2015-01-26 DIAGNOSIS — N186 End stage renal disease: Secondary | ICD-10-CM | POA: Diagnosis not present

## 2015-01-26 DIAGNOSIS — D631 Anemia in chronic kidney disease: Secondary | ICD-10-CM | POA: Diagnosis not present

## 2015-01-26 DIAGNOSIS — N2581 Secondary hyperparathyroidism of renal origin: Secondary | ICD-10-CM | POA: Diagnosis not present

## 2015-01-27 DIAGNOSIS — Z79899 Other long term (current) drug therapy: Secondary | ICD-10-CM | POA: Diagnosis not present

## 2015-01-28 DIAGNOSIS — N2581 Secondary hyperparathyroidism of renal origin: Secondary | ICD-10-CM | POA: Diagnosis not present

## 2015-01-28 DIAGNOSIS — D631 Anemia in chronic kidney disease: Secondary | ICD-10-CM | POA: Diagnosis not present

## 2015-01-28 DIAGNOSIS — N186 End stage renal disease: Secondary | ICD-10-CM | POA: Diagnosis not present

## 2015-01-31 DIAGNOSIS — D631 Anemia in chronic kidney disease: Secondary | ICD-10-CM | POA: Diagnosis not present

## 2015-01-31 DIAGNOSIS — N2581 Secondary hyperparathyroidism of renal origin: Secondary | ICD-10-CM | POA: Diagnosis not present

## 2015-01-31 DIAGNOSIS — N186 End stage renal disease: Secondary | ICD-10-CM | POA: Diagnosis not present

## 2015-02-02 DIAGNOSIS — D631 Anemia in chronic kidney disease: Secondary | ICD-10-CM | POA: Diagnosis not present

## 2015-02-02 DIAGNOSIS — N2581 Secondary hyperparathyroidism of renal origin: Secondary | ICD-10-CM | POA: Diagnosis not present

## 2015-02-02 DIAGNOSIS — N186 End stage renal disease: Secondary | ICD-10-CM | POA: Diagnosis not present

## 2015-02-04 DIAGNOSIS — D631 Anemia in chronic kidney disease: Secondary | ICD-10-CM | POA: Diagnosis not present

## 2015-02-04 DIAGNOSIS — N2581 Secondary hyperparathyroidism of renal origin: Secondary | ICD-10-CM | POA: Diagnosis not present

## 2015-02-04 DIAGNOSIS — N186 End stage renal disease: Secondary | ICD-10-CM | POA: Diagnosis not present

## 2015-02-07 DIAGNOSIS — N2581 Secondary hyperparathyroidism of renal origin: Secondary | ICD-10-CM | POA: Diagnosis not present

## 2015-02-07 DIAGNOSIS — N186 End stage renal disease: Secondary | ICD-10-CM | POA: Diagnosis not present

## 2015-02-07 DIAGNOSIS — D631 Anemia in chronic kidney disease: Secondary | ICD-10-CM | POA: Diagnosis not present

## 2015-02-09 DIAGNOSIS — N186 End stage renal disease: Secondary | ICD-10-CM | POA: Diagnosis not present

## 2015-02-09 DIAGNOSIS — D631 Anemia in chronic kidney disease: Secondary | ICD-10-CM | POA: Diagnosis not present

## 2015-02-09 DIAGNOSIS — N2581 Secondary hyperparathyroidism of renal origin: Secondary | ICD-10-CM | POA: Diagnosis not present

## 2015-02-11 DIAGNOSIS — D631 Anemia in chronic kidney disease: Secondary | ICD-10-CM | POA: Diagnosis not present

## 2015-02-11 DIAGNOSIS — N2581 Secondary hyperparathyroidism of renal origin: Secondary | ICD-10-CM | POA: Diagnosis not present

## 2015-02-11 DIAGNOSIS — N186 End stage renal disease: Secondary | ICD-10-CM | POA: Diagnosis not present

## 2015-02-14 DIAGNOSIS — N2581 Secondary hyperparathyroidism of renal origin: Secondary | ICD-10-CM | POA: Diagnosis not present

## 2015-02-14 DIAGNOSIS — D631 Anemia in chronic kidney disease: Secondary | ICD-10-CM | POA: Diagnosis not present

## 2015-02-14 DIAGNOSIS — N186 End stage renal disease: Secondary | ICD-10-CM | POA: Diagnosis not present

## 2015-02-16 DIAGNOSIS — N186 End stage renal disease: Secondary | ICD-10-CM | POA: Diagnosis not present

## 2015-02-16 DIAGNOSIS — D631 Anemia in chronic kidney disease: Secondary | ICD-10-CM | POA: Diagnosis not present

## 2015-02-16 DIAGNOSIS — N2581 Secondary hyperparathyroidism of renal origin: Secondary | ICD-10-CM | POA: Diagnosis not present

## 2015-02-17 DIAGNOSIS — N186 End stage renal disease: Secondary | ICD-10-CM | POA: Diagnosis not present

## 2015-02-17 DIAGNOSIS — Z992 Dependence on renal dialysis: Secondary | ICD-10-CM | POA: Diagnosis not present

## 2015-02-18 DIAGNOSIS — N186 End stage renal disease: Secondary | ICD-10-CM | POA: Diagnosis not present

## 2015-02-18 DIAGNOSIS — D631 Anemia in chronic kidney disease: Secondary | ICD-10-CM | POA: Diagnosis not present

## 2015-02-18 DIAGNOSIS — N2581 Secondary hyperparathyroidism of renal origin: Secondary | ICD-10-CM | POA: Diagnosis not present

## 2015-02-21 DIAGNOSIS — N2581 Secondary hyperparathyroidism of renal origin: Secondary | ICD-10-CM | POA: Diagnosis not present

## 2015-02-21 DIAGNOSIS — N186 End stage renal disease: Secondary | ICD-10-CM | POA: Diagnosis not present

## 2015-02-21 DIAGNOSIS — D631 Anemia in chronic kidney disease: Secondary | ICD-10-CM | POA: Diagnosis not present

## 2015-02-23 DIAGNOSIS — D631 Anemia in chronic kidney disease: Secondary | ICD-10-CM | POA: Diagnosis not present

## 2015-02-23 DIAGNOSIS — N186 End stage renal disease: Secondary | ICD-10-CM | POA: Diagnosis not present

## 2015-02-23 DIAGNOSIS — N2581 Secondary hyperparathyroidism of renal origin: Secondary | ICD-10-CM | POA: Diagnosis not present

## 2015-02-25 DIAGNOSIS — D631 Anemia in chronic kidney disease: Secondary | ICD-10-CM | POA: Diagnosis not present

## 2015-02-25 DIAGNOSIS — N2581 Secondary hyperparathyroidism of renal origin: Secondary | ICD-10-CM | POA: Diagnosis not present

## 2015-02-25 DIAGNOSIS — N186 End stage renal disease: Secondary | ICD-10-CM | POA: Diagnosis not present

## 2015-02-28 DIAGNOSIS — N186 End stage renal disease: Secondary | ICD-10-CM | POA: Diagnosis not present

## 2015-02-28 DIAGNOSIS — D631 Anemia in chronic kidney disease: Secondary | ICD-10-CM | POA: Diagnosis not present

## 2015-02-28 DIAGNOSIS — N2581 Secondary hyperparathyroidism of renal origin: Secondary | ICD-10-CM | POA: Diagnosis not present

## 2015-03-02 DIAGNOSIS — N186 End stage renal disease: Secondary | ICD-10-CM | POA: Diagnosis not present

## 2015-03-02 DIAGNOSIS — N2581 Secondary hyperparathyroidism of renal origin: Secondary | ICD-10-CM | POA: Diagnosis not present

## 2015-03-02 DIAGNOSIS — D631 Anemia in chronic kidney disease: Secondary | ICD-10-CM | POA: Diagnosis not present

## 2015-03-04 DIAGNOSIS — N2581 Secondary hyperparathyroidism of renal origin: Secondary | ICD-10-CM | POA: Diagnosis not present

## 2015-03-04 DIAGNOSIS — D631 Anemia in chronic kidney disease: Secondary | ICD-10-CM | POA: Diagnosis not present

## 2015-03-04 DIAGNOSIS — N186 End stage renal disease: Secondary | ICD-10-CM | POA: Diagnosis not present

## 2015-03-07 DIAGNOSIS — N2581 Secondary hyperparathyroidism of renal origin: Secondary | ICD-10-CM | POA: Diagnosis not present

## 2015-03-07 DIAGNOSIS — N186 End stage renal disease: Secondary | ICD-10-CM | POA: Diagnosis not present

## 2015-03-07 DIAGNOSIS — D631 Anemia in chronic kidney disease: Secondary | ICD-10-CM | POA: Diagnosis not present

## 2015-03-09 DIAGNOSIS — N2581 Secondary hyperparathyroidism of renal origin: Secondary | ICD-10-CM | POA: Diagnosis not present

## 2015-03-09 DIAGNOSIS — N186 End stage renal disease: Secondary | ICD-10-CM | POA: Diagnosis not present

## 2015-03-09 DIAGNOSIS — D631 Anemia in chronic kidney disease: Secondary | ICD-10-CM | POA: Diagnosis not present

## 2015-03-11 DIAGNOSIS — N186 End stage renal disease: Secondary | ICD-10-CM | POA: Diagnosis not present

## 2015-03-11 DIAGNOSIS — D631 Anemia in chronic kidney disease: Secondary | ICD-10-CM | POA: Diagnosis not present

## 2015-03-11 DIAGNOSIS — N2581 Secondary hyperparathyroidism of renal origin: Secondary | ICD-10-CM | POA: Diagnosis not present

## 2015-03-14 DIAGNOSIS — N186 End stage renal disease: Secondary | ICD-10-CM | POA: Diagnosis not present

## 2015-03-14 DIAGNOSIS — D631 Anemia in chronic kidney disease: Secondary | ICD-10-CM | POA: Diagnosis not present

## 2015-03-14 DIAGNOSIS — N2581 Secondary hyperparathyroidism of renal origin: Secondary | ICD-10-CM | POA: Diagnosis not present

## 2015-03-16 DIAGNOSIS — N186 End stage renal disease: Secondary | ICD-10-CM | POA: Diagnosis not present

## 2015-03-16 DIAGNOSIS — N2581 Secondary hyperparathyroidism of renal origin: Secondary | ICD-10-CM | POA: Diagnosis not present

## 2015-03-16 DIAGNOSIS — D631 Anemia in chronic kidney disease: Secondary | ICD-10-CM | POA: Diagnosis not present

## 2015-03-18 DIAGNOSIS — N2581 Secondary hyperparathyroidism of renal origin: Secondary | ICD-10-CM | POA: Diagnosis not present

## 2015-03-18 DIAGNOSIS — N186 End stage renal disease: Secondary | ICD-10-CM | POA: Diagnosis not present

## 2015-03-18 DIAGNOSIS — D631 Anemia in chronic kidney disease: Secondary | ICD-10-CM | POA: Diagnosis not present

## 2015-03-19 DIAGNOSIS — M3210 Systemic lupus erythematosus, organ or system involvement unspecified: Secondary | ICD-10-CM | POA: Diagnosis not present

## 2015-03-19 DIAGNOSIS — Z992 Dependence on renal dialysis: Secondary | ICD-10-CM | POA: Diagnosis not present

## 2015-03-19 DIAGNOSIS — N186 End stage renal disease: Secondary | ICD-10-CM | POA: Diagnosis not present

## 2015-03-21 DIAGNOSIS — N2581 Secondary hyperparathyroidism of renal origin: Secondary | ICD-10-CM | POA: Diagnosis not present

## 2015-03-21 DIAGNOSIS — N186 End stage renal disease: Secondary | ICD-10-CM | POA: Diagnosis not present

## 2015-03-21 DIAGNOSIS — D631 Anemia in chronic kidney disease: Secondary | ICD-10-CM | POA: Diagnosis not present

## 2015-03-23 DIAGNOSIS — N2581 Secondary hyperparathyroidism of renal origin: Secondary | ICD-10-CM | POA: Diagnosis not present

## 2015-03-23 DIAGNOSIS — D631 Anemia in chronic kidney disease: Secondary | ICD-10-CM | POA: Diagnosis not present

## 2015-03-23 DIAGNOSIS — N186 End stage renal disease: Secondary | ICD-10-CM | POA: Diagnosis not present

## 2015-03-25 DIAGNOSIS — D631 Anemia in chronic kidney disease: Secondary | ICD-10-CM | POA: Diagnosis not present

## 2015-03-25 DIAGNOSIS — N186 End stage renal disease: Secondary | ICD-10-CM | POA: Diagnosis not present

## 2015-03-25 DIAGNOSIS — N2581 Secondary hyperparathyroidism of renal origin: Secondary | ICD-10-CM | POA: Diagnosis not present

## 2015-03-28 DIAGNOSIS — D631 Anemia in chronic kidney disease: Secondary | ICD-10-CM | POA: Diagnosis not present

## 2015-03-28 DIAGNOSIS — N2581 Secondary hyperparathyroidism of renal origin: Secondary | ICD-10-CM | POA: Diagnosis not present

## 2015-03-28 DIAGNOSIS — N186 End stage renal disease: Secondary | ICD-10-CM | POA: Diagnosis not present

## 2015-03-30 DIAGNOSIS — N2581 Secondary hyperparathyroidism of renal origin: Secondary | ICD-10-CM | POA: Diagnosis not present

## 2015-03-30 DIAGNOSIS — N186 End stage renal disease: Secondary | ICD-10-CM | POA: Diagnosis not present

## 2015-03-30 DIAGNOSIS — D631 Anemia in chronic kidney disease: Secondary | ICD-10-CM | POA: Diagnosis not present

## 2015-04-01 DIAGNOSIS — D631 Anemia in chronic kidney disease: Secondary | ICD-10-CM | POA: Diagnosis not present

## 2015-04-01 DIAGNOSIS — N2581 Secondary hyperparathyroidism of renal origin: Secondary | ICD-10-CM | POA: Diagnosis not present

## 2015-04-01 DIAGNOSIS — N186 End stage renal disease: Secondary | ICD-10-CM | POA: Diagnosis not present

## 2015-04-04 DIAGNOSIS — N2581 Secondary hyperparathyroidism of renal origin: Secondary | ICD-10-CM | POA: Diagnosis not present

## 2015-04-04 DIAGNOSIS — D631 Anemia in chronic kidney disease: Secondary | ICD-10-CM | POA: Diagnosis not present

## 2015-04-04 DIAGNOSIS — N186 End stage renal disease: Secondary | ICD-10-CM | POA: Diagnosis not present

## 2015-04-06 DIAGNOSIS — D631 Anemia in chronic kidney disease: Secondary | ICD-10-CM | POA: Diagnosis not present

## 2015-04-06 DIAGNOSIS — N2581 Secondary hyperparathyroidism of renal origin: Secondary | ICD-10-CM | POA: Diagnosis not present

## 2015-04-06 DIAGNOSIS — N186 End stage renal disease: Secondary | ICD-10-CM | POA: Diagnosis not present

## 2015-04-08 DIAGNOSIS — N186 End stage renal disease: Secondary | ICD-10-CM | POA: Diagnosis not present

## 2015-04-08 DIAGNOSIS — D631 Anemia in chronic kidney disease: Secondary | ICD-10-CM | POA: Diagnosis not present

## 2015-04-08 DIAGNOSIS — N2581 Secondary hyperparathyroidism of renal origin: Secondary | ICD-10-CM | POA: Diagnosis not present

## 2015-04-11 DIAGNOSIS — N186 End stage renal disease: Secondary | ICD-10-CM | POA: Diagnosis not present

## 2015-04-11 DIAGNOSIS — D631 Anemia in chronic kidney disease: Secondary | ICD-10-CM | POA: Diagnosis not present

## 2015-04-11 DIAGNOSIS — N2581 Secondary hyperparathyroidism of renal origin: Secondary | ICD-10-CM | POA: Diagnosis not present

## 2015-04-13 DIAGNOSIS — N186 End stage renal disease: Secondary | ICD-10-CM | POA: Diagnosis not present

## 2015-04-13 DIAGNOSIS — N2581 Secondary hyperparathyroidism of renal origin: Secondary | ICD-10-CM | POA: Diagnosis not present

## 2015-04-13 DIAGNOSIS — D631 Anemia in chronic kidney disease: Secondary | ICD-10-CM | POA: Diagnosis not present

## 2015-04-15 DIAGNOSIS — N2581 Secondary hyperparathyroidism of renal origin: Secondary | ICD-10-CM | POA: Diagnosis not present

## 2015-04-15 DIAGNOSIS — D631 Anemia in chronic kidney disease: Secondary | ICD-10-CM | POA: Diagnosis not present

## 2015-04-15 DIAGNOSIS — N186 End stage renal disease: Secondary | ICD-10-CM | POA: Diagnosis not present

## 2015-04-18 DIAGNOSIS — N186 End stage renal disease: Secondary | ICD-10-CM | POA: Diagnosis not present

## 2015-04-18 DIAGNOSIS — D631 Anemia in chronic kidney disease: Secondary | ICD-10-CM | POA: Diagnosis not present

## 2015-04-18 DIAGNOSIS — N2581 Secondary hyperparathyroidism of renal origin: Secondary | ICD-10-CM | POA: Diagnosis not present

## 2015-04-19 DIAGNOSIS — Z992 Dependence on renal dialysis: Secondary | ICD-10-CM | POA: Diagnosis not present

## 2015-04-19 DIAGNOSIS — M3210 Systemic lupus erythematosus, organ or system involvement unspecified: Secondary | ICD-10-CM | POA: Diagnosis not present

## 2015-04-19 DIAGNOSIS — N186 End stage renal disease: Secondary | ICD-10-CM | POA: Diagnosis not present

## 2015-04-20 DIAGNOSIS — D631 Anemia in chronic kidney disease: Secondary | ICD-10-CM | POA: Diagnosis not present

## 2015-04-20 DIAGNOSIS — N2581 Secondary hyperparathyroidism of renal origin: Secondary | ICD-10-CM | POA: Diagnosis not present

## 2015-04-20 DIAGNOSIS — N186 End stage renal disease: Secondary | ICD-10-CM | POA: Diagnosis not present

## 2015-04-22 DIAGNOSIS — D631 Anemia in chronic kidney disease: Secondary | ICD-10-CM | POA: Diagnosis not present

## 2015-04-22 DIAGNOSIS — N186 End stage renal disease: Secondary | ICD-10-CM | POA: Diagnosis not present

## 2015-04-22 DIAGNOSIS — N2581 Secondary hyperparathyroidism of renal origin: Secondary | ICD-10-CM | POA: Diagnosis not present

## 2015-04-25 DIAGNOSIS — N2581 Secondary hyperparathyroidism of renal origin: Secondary | ICD-10-CM | POA: Diagnosis not present

## 2015-04-25 DIAGNOSIS — D631 Anemia in chronic kidney disease: Secondary | ICD-10-CM | POA: Diagnosis not present

## 2015-04-25 DIAGNOSIS — N186 End stage renal disease: Secondary | ICD-10-CM | POA: Diagnosis not present

## 2015-04-26 ENCOUNTER — Ambulatory Visit (INDEPENDENT_AMBULATORY_CARE_PROVIDER_SITE_OTHER): Payer: Medicare Other | Admitting: Internal Medicine

## 2015-04-26 ENCOUNTER — Encounter: Payer: Self-pay | Admitting: Internal Medicine

## 2015-04-26 VITALS — BP 110/62 | HR 97 | Temp 97.9°F | Resp 16 | Ht 64.0 in | Wt 118.0 lb

## 2015-04-26 DIAGNOSIS — Z992 Dependence on renal dialysis: Secondary | ICD-10-CM

## 2015-04-26 DIAGNOSIS — N189 Chronic kidney disease, unspecified: Secondary | ICD-10-CM

## 2015-04-26 DIAGNOSIS — N186 End stage renal disease: Secondary | ICD-10-CM | POA: Diagnosis not present

## 2015-04-26 DIAGNOSIS — D631 Anemia in chronic kidney disease: Secondary | ICD-10-CM

## 2015-04-26 DIAGNOSIS — M329 Systemic lupus erythematosus, unspecified: Secondary | ICD-10-CM

## 2015-04-26 DIAGNOSIS — I10 Essential (primary) hypertension: Secondary | ICD-10-CM | POA: Diagnosis not present

## 2015-04-26 NOTE — Progress Notes (Signed)
Pre visit review using our clinic review tool, if applicable. No additional management support is needed unless otherwise documented below in the visit note. 

## 2015-04-26 NOTE — Patient Instructions (Signed)
We are going to get records from your kidney doctor to see what labs they have done recently as well as the last time the cholesterol was checked. If we need to recheck anything after we get the records we will call you and you can come to our lab in the basement of this building.   It would be a good idea to start exercising some if you can, even walking on the days you do not have dialysis would be good to keep your body and heart healthy. Both the lupus and your family history give you an increased risk of heart problems.   The gynecologists that we work with most commonly are:  [facility]: [address], [place], [place] [address] [phone]  [facility]: [address] [place], [place] [address] Phone:([phone]) [facility]: [address] [place], [place] [address] Phone:([phone]  Health Maintenance Adopting a healthy lifestyle and getting preventive care can go a long way to promote health and wellness. Talk with your health care provider about what schedule of regular examinations is right for you. This is a good chance for you to check in with your provider about disease prevention and staying healthy. In between checkups, there are plenty of things you can do on your own. Experts have done a lot of research about which lifestyle changes and preventive measures are most likely to keep you healthy. Ask your health care provider for more information. WEIGHT AND DIET  Eat a healthy diet  Be sure to include plenty of vegetables, fruits, low-fat dairy products, and lean protein.  Do not eat a lot of foods high in solid fats, added sugars, or salt.  Get regular exercise. This is one of the most important things you can do for your health.  Most adults should exercise for at least 150 minutes each week. The exercise should increase your heart rate and make you sweat (moderate-intensity exercise).  Most adults should also do strengthening  exercises at least twice a week. This is in addition to the moderate-intensity exercise.  Maintain a healthy weight  Body mass index (BMI) is a measurement that can be used to identify possible weight problems. It estimates body fat based on height and weight. Your health care provider can help determine your BMI and help you achieve or maintain a healthy weight.  For females 23 years of age and older:   A BMI below 18.5 is considered underweight.  A BMI of 18.5 to 24.9 is normal.  A BMI of 25 to 29.9 is considered overweight.  A BMI of 30 and above is considered obese.  Watch levels of cholesterol and blood lipids  You should start having your blood tested for lipids and cholesterol at 39 years of age, then have this test every 5 years.  You may need to have your cholesterol levels checked more often if:  Your lipid or cholesterol levels are high.  You are older than 40 years of age.  You are at high risk for heart disease.  CANCER SCREENING   Lung Cancer  Lung cancer screening is recommended for adults 4-32 years old who are at high risk for lung cancer because of a history of smoking.  A yearly low-dose CT scan of the lungs is recommended for people who:  Currently smoke.  Have quit within the past 15 years.  Have at least a 30-pack-year history of smoking. A pack year is smoking an average of  one pack of cigarettes a day for 1 year.  Yearly screening should continue until it has been 15 years since you quit.  Yearly screening should stop if you develop a health problem that would prevent you from having lung cancer treatment.  Breast Cancer  Practice breast self-awareness. This means understanding how your breasts normally appear and feel.  It also means doing regular breast self-exams. Let your health care provider know about any changes, no matter how small.  If you are in your 20s or 30s, you should have a clinical breast exam (CBE) by a health care  provider every 1-3 years as part of a regular health exam.  If you are 60 or older, have a CBE every year. Also consider having a breast X-ray (mammogram) every year.  If you have a family history of breast cancer, talk to your health care provider about genetic screening.  If you are at high risk for breast cancer, talk to your health care provider about having an MRI and a mammogram every year.  Breast cancer gene (BRCA) assessment is recommended for women who have family members with BRCA-related cancers. BRCA-related cancers include:  Breast.  Ovarian.  Tubal.  Peritoneal cancers.  Results of the assessment will determine the need for genetic counseling and BRCA1 and BRCA2 testing. Cervical Cancer Routine pelvic examinations to screen for cervical cancer are no longer recommended for nonpregnant women who are considered low risk for cancer of the pelvic organs (ovaries, uterus, and vagina) and who do not have symptoms. A pelvic examination may be necessary if you have symptoms including those associated with pelvic infections. Ask your health care provider if a screening pelvic exam is right for you.   The Pap test is the screening test for cervical cancer for women who are considered at risk.  If you had a hysterectomy for a problem that was not cancer or a condition that could lead to cancer, then you no longer need Pap tests.  If you are older than 65 years, and you have had normal Pap tests for the past 10 years, you no longer need to have Pap tests.  If you have had past treatment for cervical cancer or a condition that could lead to cancer, you need Pap tests and screening for cancer for at least 20 years after your treatment.  If you no longer get a Pap test, assess your risk factors if they change (such as having a new sexual partner). This can affect whether you should start being screened again.  Some women have medical problems that increase their chance of getting  cervical cancer. If this is the case for you, your health care provider may recommend more frequent screening and Pap tests.  The human papillomavirus (HPV) test is another test that may be used for cervical cancer screening. The HPV test looks for the virus that can cause cell changes in the cervix. The cells collected during the Pap test can be tested for HPV.  The HPV test can be used to screen women 30 years of age and older. Getting tested for HPV can extend the interval between normal Pap tests from three to five years.  An HPV test also should be used to screen women of any age who have unclear Pap test results.  After 39 years of age, women should have HPV testing as often as Pap tests.  Colorectal Cancer  This type of cancer can be detected and often prevented.  Routine colorectal cancer  screening usually begins at 39 years of age and continues through 39 years of age.  Your health care provider may recommend screening at an earlier age if you have risk factors for colon cancer.  Your health care provider may also recommend using home test kits to check for hidden blood in the stool.  A small camera at the end of a tube can be used to examine your colon directly (sigmoidoscopy or colonoscopy). This is done to check for the earliest forms of colorectal cancer.  Routine screening usually begins at age 33.  Direct examination of the colon should be repeated every 5-10 years through 39 years of age. However, you may need to be screened more often if early forms of precancerous polyps or small growths are found. Skin Cancer  Check your skin from head to toe regularly.  Tell your health care provider about any new moles or changes in moles, especially if there is a change in a mole's shape or color.  Also tell your health care provider if you have a mole that is larger than the size of a pencil eraser.  Always use sunscreen. Apply sunscreen liberally and repeatedly throughout the  day.  Protect yourself by wearing long sleeves, pants, a wide-brimmed hat, and sunglasses whenever you are outside. HEART DISEASE, DIABETES, AND HIGH BLOOD PRESSURE   Have your blood pressure checked at least every 1-2 years. High blood pressure causes heart disease and increases the risk of stroke.  If you are between 41 years and 48 years old, ask your health care provider if you should take aspirin to prevent strokes.  Have regular diabetes screenings. This involves taking a blood sample to check your fasting blood sugar level.  If you are at a normal weight and have a low risk for diabetes, have this test once every three years after 39 years of age.  If you are overweight and have a high risk for diabetes, consider being tested at a younger age or more often. PREVENTING INFECTION  Hepatitis B  If you have a higher risk for hepatitis B, you should be screened for this virus. You are considered at high risk for hepatitis B if:  You were born in a country where hepatitis B is common. Ask your health care provider which countries are considered high risk.  Your parents were born in a high-risk country, and you have not been immunized against hepatitis B (hepatitis B vaccine).  You have HIV or AIDS.  You use needles to inject street drugs.  You live with someone who has hepatitis B.  You have had sex with someone who has hepatitis B.  You get hemodialysis treatment.  You take certain medicines for conditions, including cancer, organ transplantation, and autoimmune conditions. Hepatitis C  Blood testing is recommended for:  Everyone born from [birth year] through 1965.  Anyone with known risk factors for hepatitis C. Sexually transmitted infections (STIs)  You should be screened for sexually transmitted infections (STIs) including gonorrhea and chlamydia if:  You are sexually active and are younger than 39 years of age.  You are older than 39 years of age and your health care  provider tells you that you are at risk for this type of infection.  Your sexual activity has changed since you were last screened and you are at an increased risk for chlamydia or gonorrhea. Ask your health care provider if you are at risk.  If you do not have HIV, but are at risk, it  may be recommended that you take a prescription medicine daily to prevent HIV infection. This is called pre-exposure prophylaxis (PrEP). You are considered at risk if:  You are sexually active and do not regularly use condoms or know the HIV status of your partner(s).  You take drugs by injection.  You are sexually active with a partner who has HIV. Talk with your health care provider about whether you are at high risk of being infected with HIV. If you choose to begin PrEP, you should first be tested for HIV. You should then be tested every 3 months for as long as you are taking PrEP.  PREGNANCY   If you are premenopausal and you may become pregnant, ask your health care provider about preconception counseling.  If you may become pregnant, take 400 to 800 micrograms (mcg) of folic acid every day.  If you want to prevent pregnancy, talk to your health care provider about birth control (contraception). OSTEOPOROSIS AND MENOPAUSE   Osteoporosis is a disease in which the bones lose minerals and strength with aging. This can result in serious bone fractures. Your risk for osteoporosis can be identified using a bone density scan.  If you are 6 years of age or older, or if you are at risk for osteoporosis and fractures, ask your health care provider if you should be screened.  Ask your health care provider whether you should take a calcium or vitamin D supplement to lower your risk for osteoporosis.  Menopause may have certain physical symptoms and risks.  Hormone replacement therapy may reduce some of these symptoms and risks. Talk to your health care provider about whether hormone replacement therapy is  right for you.  HOME CARE INSTRUCTIONS   Schedule regular health, dental, and eye exams.  Stay current with your immunizations.   Do not use any tobacco products including cigarettes, chewing tobacco, or electronic cigarettes.  If you are pregnant, do not drink alcohol.  If you are breastfeeding, limit how much and how often you drink alcohol.  Limit alcohol intake to no more than 1 drink per day for nonpregnant women. One drink equals 12 ounces of beer, 5 ounces of wine, or 1 ounces of hard liquor.  Do not use street drugs.  Do not share needles.  Ask your health care provider for help if you need support or information about quitting drugs.  Tell your health care provider if you often feel depressed.  Tell your health care provider if you have ever been abused or do not feel safe at home. Document Released: 05/21/2011 Document Revised: 03/22/2014 Document Reviewed: 10/07/2013 [facility] Patient Information 2015 [place], [place]. This information is not intended to replace advice given to you by your health care provider. Make sure you discuss any questions you have with your health care provider.

## 2015-04-27 DIAGNOSIS — N2581 Secondary hyperparathyroidism of renal origin: Secondary | ICD-10-CM | POA: Diagnosis not present

## 2015-04-27 DIAGNOSIS — N186 End stage renal disease: Secondary | ICD-10-CM | POA: Diagnosis not present

## 2015-04-27 DIAGNOSIS — D631 Anemia in chronic kidney disease: Secondary | ICD-10-CM | POA: Diagnosis not present

## 2015-04-29 DIAGNOSIS — D631 Anemia in chronic kidney disease: Secondary | ICD-10-CM | POA: Diagnosis not present

## 2015-04-29 DIAGNOSIS — N2581 Secondary hyperparathyroidism of renal origin: Secondary | ICD-10-CM | POA: Diagnosis not present

## 2015-04-29 DIAGNOSIS — N186 End stage renal disease: Secondary | ICD-10-CM | POA: Diagnosis not present

## 2015-04-29 DIAGNOSIS — M329 Systemic lupus erythematosus, unspecified: Secondary | ICD-10-CM | POA: Insufficient documentation

## 2015-04-29 NOTE — Assessment & Plan Note (Signed)
Will request records to get last CBC. Not currently on aranesp or similar per patient knowledge.

## 2015-04-29 NOTE — Assessment & Plan Note (Signed)
BP controlled today on lasix, labetalol and lisinopril. She does have ESRD (not as a consequence).

## 2015-04-29 NOTE — Assessment & Plan Note (Signed)
Taking prednisone and plaquenil. Will obtain notes from nephrology to see if the prednisone has been kept due to her kidney disease. Since she is now on dialysis we may be able to stop. This is a high risk medicine and increases her risk of bone problems. 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 No chest pains or angina at this time. She is also taking plaquenil and need to ensure yearly eye exam for monitoring. Talked to her about it at visit.

## 2015-04-29 NOTE — Assessment & Plan Note (Signed)
Vascular access intact with good thrill and bruit. Will obtain her records to get immunization records since she is not sure what she has been immunized for and when in order to not duplicate.

## 2015-04-29 NOTE — Progress Notes (Signed)
   Subjective:    Patient ID: [name], female    DOB: [birth year], 39 y.o.   MRN: [id number]  HPI The patient is a 39 YO female who is coming in new to talk about her lupus. 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 She is taking plaquenil and prednisone for her lupus and until now her kidney doctor has been managing the lupus. No frequent flares. Flares last a couple days and she tries hard to stay rested and avoid sun to avoid flares. She is tired some days and cannot have the energy to do much. She does try to do what she can. Due to her insurance situation she is not on the kidney transplant list at this time although she has talked to her nephrologist about it. No new complaints or symptoms. She thinks that they checked her blood recently at dialysis.   PMH, [facility], social history reviewed and updated.   Review of Systems  Constitutional: Positive for fatigue. Negative for fever, activity change and appetite change.  HENT: Negative.   Eyes: Negative.   Respiratory: Negative for cough, chest tightness, shortness of breath and wheezing.   Cardiovascular: Negative for chest pain, palpitations and leg swelling.  Gastrointestinal: Negative for nausea, abdominal pain, diarrhea, constipation and abdominal distention.  Musculoskeletal: Positive for myalgias.       Occasional  Skin: Negative.   Neurological: Negative.   Psychiatric/Behavioral: Negative.       Objective:   Physical Exam  Constitutional: She is oriented to person, place, and time. She appears well-developed and well-nourished.  HENT:  Head: Normocephalic and atraumatic.  Eyes: EOM are normal.  Neck: Normal range of motion.  Cardiovascular: Normal rate and regular rhythm.   Flow murmur, vascular access with good thrill and bruit  Pulmonary/Chest: Effort normal. No respiratory distress. She has no wheezes. She has no rales.  Abdominal: Soft. She exhibits no distension. There is no tenderness.  There is no rebound.  Musculoskeletal: She exhibits edema.  Left ankle and foot swollen 2+ to mid shin  Neurological: She is alert and oriented to person, place, and time. Coordination normal.  Skin: Skin is warm and dry.   Filed Vitals:   04/26/15 0906  BP: 110/62  Pulse: 97  Temp: 97.9 F (36.6 C)  TempSrc: Oral  Resp: 16  Height: 5\' 4"  (1.626 m)  Weight: 118 lb (53.524 kg)  SpO2: 97%      Assessment & Plan:

## 2015-05-02 DIAGNOSIS — N186 End stage renal disease: Secondary | ICD-10-CM | POA: Diagnosis not present

## 2015-05-02 DIAGNOSIS — N2581 Secondary hyperparathyroidism of renal origin: Secondary | ICD-10-CM | POA: Diagnosis not present

## 2015-05-02 DIAGNOSIS — D631 Anemia in chronic kidney disease: Secondary | ICD-10-CM | POA: Diagnosis not present

## 2015-05-04 DIAGNOSIS — N2581 Secondary hyperparathyroidism of renal origin: Secondary | ICD-10-CM | POA: Diagnosis not present

## 2015-05-04 DIAGNOSIS — N186 End stage renal disease: Secondary | ICD-10-CM | POA: Diagnosis not present

## 2015-05-04 DIAGNOSIS — D631 Anemia in chronic kidney disease: Secondary | ICD-10-CM | POA: Diagnosis not present

## 2015-05-06 DIAGNOSIS — N2581 Secondary hyperparathyroidism of renal origin: Secondary | ICD-10-CM | POA: Diagnosis not present

## 2015-05-06 DIAGNOSIS — N186 End stage renal disease: Secondary | ICD-10-CM | POA: Diagnosis not present

## 2015-05-06 DIAGNOSIS — D631 Anemia in chronic kidney disease: Secondary | ICD-10-CM | POA: Diagnosis not present

## 2015-05-09 DIAGNOSIS — N2581 Secondary hyperparathyroidism of renal origin: Secondary | ICD-10-CM | POA: Diagnosis not present

## 2015-05-09 DIAGNOSIS — N186 End stage renal disease: Secondary | ICD-10-CM | POA: Diagnosis not present

## 2015-05-09 DIAGNOSIS — D631 Anemia in chronic kidney disease: Secondary | ICD-10-CM | POA: Diagnosis not present

## 2015-05-11 DIAGNOSIS — N186 End stage renal disease: Secondary | ICD-10-CM | POA: Diagnosis not present

## 2015-05-11 DIAGNOSIS — N2581 Secondary hyperparathyroidism of renal origin: Secondary | ICD-10-CM | POA: Diagnosis not present

## 2015-05-11 DIAGNOSIS — D631 Anemia in chronic kidney disease: Secondary | ICD-10-CM | POA: Diagnosis not present

## 2015-05-12 DIAGNOSIS — Z6820 Body mass index (BMI) 20.0-20.9, adult: Secondary | ICD-10-CM | POA: Diagnosis not present

## 2015-05-12 DIAGNOSIS — Z992 Dependence on renal dialysis: Secondary | ICD-10-CM | POA: Diagnosis not present

## 2015-05-12 DIAGNOSIS — I871 Compression of vein: Secondary | ICD-10-CM | POA: Diagnosis not present

## 2015-05-12 DIAGNOSIS — Z124 Encounter for screening for malignant neoplasm of cervix: Secondary | ICD-10-CM | POA: Diagnosis not present

## 2015-05-12 DIAGNOSIS — N186 End stage renal disease: Secondary | ICD-10-CM | POA: Diagnosis not present

## 2015-05-12 DIAGNOSIS — T82858D Stenosis of vascular prosthetic devices, implants and grafts, subsequent encounter: Secondary | ICD-10-CM | POA: Diagnosis not present

## 2015-05-13 ENCOUNTER — Other Ambulatory Visit: Payer: Self-pay

## 2015-05-13 DIAGNOSIS — N186 End stage renal disease: Secondary | ICD-10-CM

## 2015-05-13 DIAGNOSIS — Z01810 Encounter for preprocedural cardiovascular examination: Secondary | ICD-10-CM

## 2015-05-13 DIAGNOSIS — T82511A Breakdown (mechanical) of surgically created arteriovenous shunt, initial encounter: Secondary | ICD-10-CM

## 2015-05-13 DIAGNOSIS — D631 Anemia in chronic kidney disease: Secondary | ICD-10-CM | POA: Diagnosis not present

## 2015-05-13 DIAGNOSIS — N2581 Secondary hyperparathyroidism of renal origin: Secondary | ICD-10-CM | POA: Diagnosis not present

## 2015-05-16 ENCOUNTER — Other Ambulatory Visit: Payer: Self-pay

## 2015-05-16 DIAGNOSIS — N2581 Secondary hyperparathyroidism of renal origin: Secondary | ICD-10-CM | POA: Diagnosis not present

## 2015-05-16 DIAGNOSIS — N186 End stage renal disease: Secondary | ICD-10-CM

## 2015-05-16 DIAGNOSIS — D631 Anemia in chronic kidney disease: Secondary | ICD-10-CM | POA: Diagnosis not present

## 2015-05-16 DIAGNOSIS — Z01810 Encounter for preprocedural cardiovascular examination: Secondary | ICD-10-CM

## 2015-05-18 ENCOUNTER — Encounter: Payer: Self-pay | Admitting: Vascular Surgery

## 2015-05-18 DIAGNOSIS — N2581 Secondary hyperparathyroidism of renal origin: Secondary | ICD-10-CM | POA: Diagnosis not present

## 2015-05-18 DIAGNOSIS — D631 Anemia in chronic kidney disease: Secondary | ICD-10-CM | POA: Diagnosis not present

## 2015-05-18 DIAGNOSIS — N186 End stage renal disease: Secondary | ICD-10-CM | POA: Diagnosis not present

## 2015-05-19 DIAGNOSIS — Z992 Dependence on renal dialysis: Secondary | ICD-10-CM | POA: Diagnosis not present

## 2015-05-19 DIAGNOSIS — M3210 Systemic lupus erythematosus, organ or system involvement unspecified: Secondary | ICD-10-CM | POA: Diagnosis not present

## 2015-05-19 DIAGNOSIS — N186 End stage renal disease: Secondary | ICD-10-CM | POA: Diagnosis not present

## 2015-05-20 DIAGNOSIS — D631 Anemia in chronic kidney disease: Secondary | ICD-10-CM | POA: Diagnosis not present

## 2015-05-20 DIAGNOSIS — N186 End stage renal disease: Secondary | ICD-10-CM | POA: Diagnosis not present

## 2015-05-20 DIAGNOSIS — N2581 Secondary hyperparathyroidism of renal origin: Secondary | ICD-10-CM | POA: Diagnosis not present

## 2015-05-23 DIAGNOSIS — N186 End stage renal disease: Secondary | ICD-10-CM | POA: Diagnosis not present

## 2015-05-23 DIAGNOSIS — N2581 Secondary hyperparathyroidism of renal origin: Secondary | ICD-10-CM | POA: Diagnosis not present

## 2015-05-23 DIAGNOSIS — D631 Anemia in chronic kidney disease: Secondary | ICD-10-CM | POA: Diagnosis not present

## 2015-05-24 ENCOUNTER — Ambulatory Visit (INDEPENDENT_AMBULATORY_CARE_PROVIDER_SITE_OTHER)
Admission: RE | Admit: 2015-05-24 | Discharge: 2015-05-24 | Disposition: A | Discharge status: Home / Self Care | Payer: Medicare Other | Source: Ambulatory Visit | Attending: Vascular Surgery | Admitting: Vascular Surgery

## 2015-05-24 ENCOUNTER — Encounter: Payer: Self-pay | Admitting: Vascular Surgery

## 2015-05-24 ENCOUNTER — Ambulatory Visit (INDEPENDENT_AMBULATORY_CARE_PROVIDER_SITE_OTHER): Payer: Medicare Other | Admitting: Vascular Surgery

## 2015-05-24 ENCOUNTER — Ambulatory Visit (HOSPITAL_COMMUNITY)
Admission: RE | Admit: 2015-05-24 | Discharge: 2015-05-24 | Disposition: A | Discharge status: Home / Self Care | Payer: Medicare Other | Source: Ambulatory Visit | Attending: Vascular Surgery | Admitting: Vascular Surgery

## 2015-05-24 VITALS — BP 111/65 | HR 72 | Resp 16 | Ht 64.0 in | Wt 119.0 lb

## 2015-05-24 DIAGNOSIS — Z01810 Encounter for preprocedural cardiovascular examination: Secondary | ICD-10-CM

## 2015-05-24 DIAGNOSIS — N186 End stage renal disease: Secondary | ICD-10-CM | POA: Insufficient documentation

## 2015-05-24 DIAGNOSIS — T82511A Breakdown (mechanical) of surgically created arteriovenous shunt, initial encounter: Secondary | ICD-10-CM | POA: Diagnosis not present

## 2015-05-24 DIAGNOSIS — Z992 Dependence on renal dialysis: Secondary | ICD-10-CM

## 2015-05-24 NOTE — Progress Notes (Signed)
The patient presents today for discussion of renal dialysis access. A long history of renal access. She currently is being dialyzed via a left upper arm loop Gore-Tex graft. I reviewed her actual images from [place] vascular access from June 23. Also reviewed images from a right arm venogram by Dr. Bridgett Larsson in May 2015.  She also underwent duplex imaging of her right arm veins today in our office. She did have some high flow and therefore underwent intervention on 623 in her left arm access. She had narrowing at the proximal and distal ends of a prior placed stent graft. This was in the proximal venous segment going into the venous anastomosis. She had dilatation and did have rupture of the more proximal portion and therefore underwent a new stent as well. The final result showed good flow through the stent area of the graft and good flow through the graft.  Past Medical History  Diagnosis Date  . Hypertension   . Lupus   . Hyperlipemia   . Anemia   . Secondary hyperparathyroidism (of renal origin)   . 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 [id number])     History  Substance Use Topics  . Smoking status: Never Smoker   . Smokeless tobacco: Never Used  . Alcohol Use: No    Family History  Problem Relation Age of Onset  . Asthma Sister   . Hypertension Mother   . Heart attack Mother     Allergies  Allergen Reactions  . Amlodipine Hives    Swelling  . Sulfa Antibiotics Hives and Itching  . Vancomycin Hives and Itching     Current outpatient prescriptions:  .  acetaminophen (TYLENOL) 500 MG tablet, Take 500 mg by mouth as needed for headache. , Disp: , Rfl:  .  calcium acetate (PHOSLO) 667 MG capsule, Take 1,334 mg by mouth 3 (three) times daily with meals. , Disp: , Rfl:  .  furosemide (LASIX) 80 MG tablet,  Take 80 mg by mouth daily. , Disp: , Rfl:  .  hydroxychloroquine (PLAQUENIL) 200 MG tablet, Take 200 mg by mouth 2 (two) times daily., Disp: , Rfl:  .  labetalol (NORMODYNE) 300 MG tablet, Take 300 mg by mouth 2 (two) times daily., Disp: , Rfl:  .  lisinopril (PRINIVIL,ZESTRIL) 40 MG tablet, Take 40 mg by mouth daily., Disp: , Rfl:  .  metroNIDAZOLE (METROGEL) 0.75 % vaginal gel, as needed., Disp: , Rfl: 0 .  predniSONE (DELTASONE) 10 MG tablet, Take 10 mg by mouth daily with breakfast., Disp: , Rfl:  .  traZODone (DESYREL) 50 MG tablet, Take 25 mg by mouth at bedtime. , Disp: , Rfl:   Filed Vitals:   05/24/15 1507  BP: 111/65  Pulse: 72  Resp: 16  Height: 5\' 4"  (1.626 m)  Weight: 119 lb (53.978 kg)  SpO2: 100%    Body mass index is 20.42 kg/(m^2).       On physical exam she does have a good thrill throughout her left upper arm loop graft. No evidence of aneurysm formation. On the right arm she does have a palpable right radial pulse. No thrill where she had a prior first stage brachial vein to brachial artery transposition.  Her arterial venous duplex today shows a high bifurcation of her brachial artery.  Veins are all extremely small.  Her right arm venogram from May 2015 showed good caliber axillary vein.  Had long discussion regarding options. She currently reports she is having good flow through her left arm graft and had not had any intervention for several months. I explained the option of placing a new prosthetic upper arm Gore-Tex graft. Since she is currently having good access with her recent intervention, I would recommend continued use of her left arm access. She certainly is not an ideal candidate for right arm access with her high radial artery bifurcation. Would like to defer placement of a new graft as long as possible. Explained that if she had complete failure of her left arm graft that we would place a catheter and graft at the same setting and that she would use  the graft after 3-4 weeks. She wishes to do this rather than place a  right arm graft this time.

## 2015-05-25 DIAGNOSIS — N186 End stage renal disease: Secondary | ICD-10-CM | POA: Diagnosis not present

## 2015-05-25 DIAGNOSIS — N2581 Secondary hyperparathyroidism of renal origin: Secondary | ICD-10-CM | POA: Diagnosis not present

## 2015-05-25 DIAGNOSIS — D631 Anemia in chronic kidney disease: Secondary | ICD-10-CM | POA: Diagnosis not present

## 2015-05-26 ENCOUNTER — Encounter: Payer: Self-pay | Admitting: Nephrology

## 2015-05-26 DIAGNOSIS — Z1231 Encounter for screening mammogram for malignant neoplasm of breast: Secondary | ICD-10-CM | POA: Diagnosis not present

## 2015-05-27 DIAGNOSIS — D631 Anemia in chronic kidney disease: Secondary | ICD-10-CM | POA: Diagnosis not present

## 2015-05-27 DIAGNOSIS — N2581 Secondary hyperparathyroidism of renal origin: Secondary | ICD-10-CM | POA: Diagnosis not present

## 2015-05-27 DIAGNOSIS — N186 End stage renal disease: Secondary | ICD-10-CM | POA: Diagnosis not present

## 2015-05-30 DIAGNOSIS — D631 Anemia in chronic kidney disease: Secondary | ICD-10-CM | POA: Diagnosis not present

## 2015-05-30 DIAGNOSIS — N2581 Secondary hyperparathyroidism of renal origin: Secondary | ICD-10-CM | POA: Diagnosis not present

## 2015-05-30 DIAGNOSIS — N186 End stage renal disease: Secondary | ICD-10-CM | POA: Diagnosis not present

## 2015-05-31 ENCOUNTER — Other Ambulatory Visit: Payer: Self-pay | Admitting: Obstetrics and Gynecology

## 2015-05-31 DIAGNOSIS — R928 Other abnormal and inconclusive findings on diagnostic imaging of breast: Secondary | ICD-10-CM

## 2015-06-01 DIAGNOSIS — N186 End stage renal disease: Secondary | ICD-10-CM | POA: Diagnosis not present

## 2015-06-01 DIAGNOSIS — D631 Anemia in chronic kidney disease: Secondary | ICD-10-CM | POA: Diagnosis not present

## 2015-06-01 DIAGNOSIS — N2581 Secondary hyperparathyroidism of renal origin: Secondary | ICD-10-CM | POA: Diagnosis not present

## 2015-06-03 DIAGNOSIS — N2581 Secondary hyperparathyroidism of renal origin: Secondary | ICD-10-CM | POA: Diagnosis not present

## 2015-06-03 DIAGNOSIS — D631 Anemia in chronic kidney disease: Secondary | ICD-10-CM | POA: Diagnosis not present

## 2015-06-03 DIAGNOSIS — N186 End stage renal disease: Secondary | ICD-10-CM | POA: Diagnosis not present

## 2015-06-06 ENCOUNTER — Telehealth: Payer: Self-pay | Admitting: Internal Medicine

## 2015-06-06 DIAGNOSIS — N186 End stage renal disease: Secondary | ICD-10-CM | POA: Diagnosis not present

## 2015-06-06 DIAGNOSIS — D631 Anemia in chronic kidney disease: Secondary | ICD-10-CM | POA: Diagnosis not present

## 2015-06-06 DIAGNOSIS — N2581 Secondary hyperparathyroidism of renal origin: Secondary | ICD-10-CM | POA: Diagnosis not present

## 2015-06-06 NOTE — Telephone Encounter (Signed)
Received records from [place] forwarded to Dr. Doug Sou 06/06/15 fbg.

## 2015-06-08 DIAGNOSIS — D631 Anemia in chronic kidney disease: Secondary | ICD-10-CM | POA: Diagnosis not present

## 2015-06-08 DIAGNOSIS — N186 End stage renal disease: Secondary | ICD-10-CM | POA: Diagnosis not present

## 2015-06-08 DIAGNOSIS — N2581 Secondary hyperparathyroidism of renal origin: Secondary | ICD-10-CM | POA: Diagnosis not present

## 2015-06-09 ENCOUNTER — Ambulatory Visit
Admission: RE | Admit: 2015-06-09 | Discharge: 2015-06-09 | Disposition: A | Discharge status: Home / Self Care | Payer: Medicare Other | Source: Ambulatory Visit | Attending: Obstetrics and Gynecology | Admitting: Obstetrics and Gynecology

## 2015-06-09 ENCOUNTER — Other Ambulatory Visit: Payer: Self-pay

## 2015-06-09 DIAGNOSIS — R928 Other abnormal and inconclusive findings on diagnostic imaging of breast: Secondary | ICD-10-CM

## 2015-06-10 DIAGNOSIS — N186 End stage renal disease: Secondary | ICD-10-CM | POA: Diagnosis not present

## 2015-06-10 DIAGNOSIS — N2581 Secondary hyperparathyroidism of renal origin: Secondary | ICD-10-CM | POA: Diagnosis not present

## 2015-06-10 DIAGNOSIS — D631 Anemia in chronic kidney disease: Secondary | ICD-10-CM | POA: Diagnosis not present

## 2015-06-13 DIAGNOSIS — N2581 Secondary hyperparathyroidism of renal origin: Secondary | ICD-10-CM | POA: Diagnosis not present

## 2015-06-13 DIAGNOSIS — D631 Anemia in chronic kidney disease: Secondary | ICD-10-CM | POA: Diagnosis not present

## 2015-06-13 DIAGNOSIS — N186 End stage renal disease: Secondary | ICD-10-CM | POA: Diagnosis not present

## 2015-06-15 DIAGNOSIS — N186 End stage renal disease: Secondary | ICD-10-CM | POA: Diagnosis not present

## 2015-06-15 DIAGNOSIS — N2581 Secondary hyperparathyroidism of renal origin: Secondary | ICD-10-CM | POA: Diagnosis not present

## 2015-06-15 DIAGNOSIS — D631 Anemia in chronic kidney disease: Secondary | ICD-10-CM | POA: Diagnosis not present

## 2015-06-17 DIAGNOSIS — N186 End stage renal disease: Secondary | ICD-10-CM | POA: Diagnosis not present

## 2015-06-17 DIAGNOSIS — D631 Anemia in chronic kidney disease: Secondary | ICD-10-CM | POA: Diagnosis not present

## 2015-06-17 DIAGNOSIS — N2581 Secondary hyperparathyroidism of renal origin: Secondary | ICD-10-CM | POA: Diagnosis not present

## 2015-06-19 DIAGNOSIS — N186 End stage renal disease: Secondary | ICD-10-CM | POA: Diagnosis not present

## 2015-06-19 DIAGNOSIS — Z992 Dependence on renal dialysis: Secondary | ICD-10-CM | POA: Diagnosis not present

## 2015-06-19 DIAGNOSIS — M3210 Systemic lupus erythematosus, organ or system involvement unspecified: Secondary | ICD-10-CM | POA: Diagnosis not present

## 2015-06-20 DIAGNOSIS — N2581 Secondary hyperparathyroidism of renal origin: Secondary | ICD-10-CM | POA: Diagnosis not present

## 2015-06-20 DIAGNOSIS — D631 Anemia in chronic kidney disease: Secondary | ICD-10-CM | POA: Diagnosis not present

## 2015-06-20 DIAGNOSIS — N186 End stage renal disease: Secondary | ICD-10-CM | POA: Diagnosis not present

## 2015-06-22 DIAGNOSIS — D631 Anemia in chronic kidney disease: Secondary | ICD-10-CM | POA: Diagnosis not present

## 2015-06-22 DIAGNOSIS — N2581 Secondary hyperparathyroidism of renal origin: Secondary | ICD-10-CM | POA: Diagnosis not present

## 2015-06-22 DIAGNOSIS — N186 End stage renal disease: Secondary | ICD-10-CM | POA: Diagnosis not present

## 2015-06-24 DIAGNOSIS — N186 End stage renal disease: Secondary | ICD-10-CM | POA: Diagnosis not present

## 2015-06-24 DIAGNOSIS — N2581 Secondary hyperparathyroidism of renal origin: Secondary | ICD-10-CM | POA: Diagnosis not present

## 2015-06-24 DIAGNOSIS — D631 Anemia in chronic kidney disease: Secondary | ICD-10-CM | POA: Diagnosis not present

## 2015-06-27 DIAGNOSIS — N186 End stage renal disease: Secondary | ICD-10-CM | POA: Diagnosis not present

## 2015-06-27 DIAGNOSIS — N2581 Secondary hyperparathyroidism of renal origin: Secondary | ICD-10-CM | POA: Diagnosis not present

## 2015-06-27 DIAGNOSIS — D631 Anemia in chronic kidney disease: Secondary | ICD-10-CM | POA: Diagnosis not present

## 2015-06-29 DIAGNOSIS — D631 Anemia in chronic kidney disease: Secondary | ICD-10-CM | POA: Diagnosis not present

## 2015-06-29 DIAGNOSIS — N186 End stage renal disease: Secondary | ICD-10-CM | POA: Diagnosis not present

## 2015-06-29 DIAGNOSIS — N2581 Secondary hyperparathyroidism of renal origin: Secondary | ICD-10-CM | POA: Diagnosis not present

## 2015-07-01 DIAGNOSIS — D631 Anemia in chronic kidney disease: Secondary | ICD-10-CM | POA: Diagnosis not present

## 2015-07-01 DIAGNOSIS — N2581 Secondary hyperparathyroidism of renal origin: Secondary | ICD-10-CM | POA: Diagnosis not present

## 2015-07-01 DIAGNOSIS — N186 End stage renal disease: Secondary | ICD-10-CM | POA: Diagnosis not present

## 2015-07-04 DIAGNOSIS — N2581 Secondary hyperparathyroidism of renal origin: Secondary | ICD-10-CM | POA: Diagnosis not present

## 2015-07-04 DIAGNOSIS — D631 Anemia in chronic kidney disease: Secondary | ICD-10-CM | POA: Diagnosis not present

## 2015-07-04 DIAGNOSIS — N186 End stage renal disease: Secondary | ICD-10-CM | POA: Diagnosis not present

## 2015-07-06 DIAGNOSIS — D631 Anemia in chronic kidney disease: Secondary | ICD-10-CM | POA: Diagnosis not present

## 2015-07-06 DIAGNOSIS — N2581 Secondary hyperparathyroidism of renal origin: Secondary | ICD-10-CM | POA: Diagnosis not present

## 2015-07-06 DIAGNOSIS — N186 End stage renal disease: Secondary | ICD-10-CM | POA: Diagnosis not present

## 2015-07-08 DIAGNOSIS — D631 Anemia in chronic kidney disease: Secondary | ICD-10-CM | POA: Diagnosis not present

## 2015-07-08 DIAGNOSIS — N2581 Secondary hyperparathyroidism of renal origin: Secondary | ICD-10-CM | POA: Diagnosis not present

## 2015-07-08 DIAGNOSIS — N186 End stage renal disease: Secondary | ICD-10-CM | POA: Diagnosis not present

## 2015-07-11 DIAGNOSIS — D631 Anemia in chronic kidney disease: Secondary | ICD-10-CM | POA: Diagnosis not present

## 2015-07-11 DIAGNOSIS — N186 End stage renal disease: Secondary | ICD-10-CM | POA: Diagnosis not present

## 2015-07-11 DIAGNOSIS — N2581 Secondary hyperparathyroidism of renal origin: Secondary | ICD-10-CM | POA: Diagnosis not present

## 2015-07-13 DIAGNOSIS — N186 End stage renal disease: Secondary | ICD-10-CM | POA: Diagnosis not present

## 2015-07-13 DIAGNOSIS — N2581 Secondary hyperparathyroidism of renal origin: Secondary | ICD-10-CM | POA: Diagnosis not present

## 2015-07-13 DIAGNOSIS — D631 Anemia in chronic kidney disease: Secondary | ICD-10-CM | POA: Diagnosis not present

## 2015-07-15 DIAGNOSIS — N186 End stage renal disease: Secondary | ICD-10-CM | POA: Diagnosis not present

## 2015-07-15 DIAGNOSIS — D631 Anemia in chronic kidney disease: Secondary | ICD-10-CM | POA: Diagnosis not present

## 2015-07-15 DIAGNOSIS — N2581 Secondary hyperparathyroidism of renal origin: Secondary | ICD-10-CM | POA: Diagnosis not present

## 2015-07-18 DIAGNOSIS — D631 Anemia in chronic kidney disease: Secondary | ICD-10-CM | POA: Diagnosis not present

## 2015-07-18 DIAGNOSIS — N186 End stage renal disease: Secondary | ICD-10-CM | POA: Diagnosis not present

## 2015-07-18 DIAGNOSIS — N2581 Secondary hyperparathyroidism of renal origin: Secondary | ICD-10-CM | POA: Diagnosis not present

## 2015-07-20 DIAGNOSIS — N2581 Secondary hyperparathyroidism of renal origin: Secondary | ICD-10-CM | POA: Diagnosis not present

## 2015-07-20 DIAGNOSIS — D631 Anemia in chronic kidney disease: Secondary | ICD-10-CM | POA: Diagnosis not present

## 2015-07-20 DIAGNOSIS — Z992 Dependence on renal dialysis: Secondary | ICD-10-CM | POA: Diagnosis not present

## 2015-07-20 DIAGNOSIS — N186 End stage renal disease: Secondary | ICD-10-CM | POA: Diagnosis not present

## 2015-07-20 DIAGNOSIS — M3210 Systemic lupus erythematosus, organ or system involvement unspecified: Secondary | ICD-10-CM | POA: Diagnosis not present

## 2015-07-22 DIAGNOSIS — D631 Anemia in chronic kidney disease: Secondary | ICD-10-CM | POA: Diagnosis not present

## 2015-07-22 DIAGNOSIS — N2581 Secondary hyperparathyroidism of renal origin: Secondary | ICD-10-CM | POA: Diagnosis not present

## 2015-07-22 DIAGNOSIS — N186 End stage renal disease: Secondary | ICD-10-CM | POA: Diagnosis not present

## 2015-07-22 DIAGNOSIS — Z23 Encounter for immunization: Secondary | ICD-10-CM | POA: Diagnosis not present

## 2015-07-25 DIAGNOSIS — N186 End stage renal disease: Secondary | ICD-10-CM | POA: Diagnosis not present

## 2015-07-25 DIAGNOSIS — N2581 Secondary hyperparathyroidism of renal origin: Secondary | ICD-10-CM | POA: Diagnosis not present

## 2015-07-25 DIAGNOSIS — D631 Anemia in chronic kidney disease: Secondary | ICD-10-CM | POA: Diagnosis not present

## 2015-07-25 DIAGNOSIS — Z23 Encounter for immunization: Secondary | ICD-10-CM | POA: Diagnosis not present

## 2015-07-27 DIAGNOSIS — D631 Anemia in chronic kidney disease: Secondary | ICD-10-CM | POA: Diagnosis not present

## 2015-07-27 DIAGNOSIS — Z23 Encounter for immunization: Secondary | ICD-10-CM | POA: Diagnosis not present

## 2015-07-27 DIAGNOSIS — N186 End stage renal disease: Secondary | ICD-10-CM | POA: Diagnosis not present

## 2015-07-27 DIAGNOSIS — N2581 Secondary hyperparathyroidism of renal origin: Secondary | ICD-10-CM | POA: Diagnosis not present

## 2015-07-29 DIAGNOSIS — N2581 Secondary hyperparathyroidism of renal origin: Secondary | ICD-10-CM | POA: Diagnosis not present

## 2015-07-29 DIAGNOSIS — Z23 Encounter for immunization: Secondary | ICD-10-CM | POA: Diagnosis not present

## 2015-07-29 DIAGNOSIS — N186 End stage renal disease: Secondary | ICD-10-CM | POA: Diagnosis not present

## 2015-07-29 DIAGNOSIS — D631 Anemia in chronic kidney disease: Secondary | ICD-10-CM | POA: Diagnosis not present

## 2015-08-01 DIAGNOSIS — N186 End stage renal disease: Secondary | ICD-10-CM | POA: Diagnosis not present

## 2015-08-01 DIAGNOSIS — D631 Anemia in chronic kidney disease: Secondary | ICD-10-CM | POA: Diagnosis not present

## 2015-08-01 DIAGNOSIS — Z23 Encounter for immunization: Secondary | ICD-10-CM | POA: Diagnosis not present

## 2015-08-01 DIAGNOSIS — N2581 Secondary hyperparathyroidism of renal origin: Secondary | ICD-10-CM | POA: Diagnosis not present

## 2015-08-03 DIAGNOSIS — Z23 Encounter for immunization: Secondary | ICD-10-CM | POA: Diagnosis not present

## 2015-08-03 DIAGNOSIS — N2581 Secondary hyperparathyroidism of renal origin: Secondary | ICD-10-CM | POA: Diagnosis not present

## 2015-08-03 DIAGNOSIS — D631 Anemia in chronic kidney disease: Secondary | ICD-10-CM | POA: Diagnosis not present

## 2015-08-03 DIAGNOSIS — N186 End stage renal disease: Secondary | ICD-10-CM | POA: Diagnosis not present

## 2015-08-05 DIAGNOSIS — D631 Anemia in chronic kidney disease: Secondary | ICD-10-CM | POA: Diagnosis not present

## 2015-08-05 DIAGNOSIS — N2581 Secondary hyperparathyroidism of renal origin: Secondary | ICD-10-CM | POA: Diagnosis not present

## 2015-08-05 DIAGNOSIS — Z23 Encounter for immunization: Secondary | ICD-10-CM | POA: Diagnosis not present

## 2015-08-05 DIAGNOSIS — N186 End stage renal disease: Secondary | ICD-10-CM | POA: Diagnosis not present

## 2015-08-08 DIAGNOSIS — N186 End stage renal disease: Secondary | ICD-10-CM | POA: Diagnosis not present

## 2015-08-08 DIAGNOSIS — D631 Anemia in chronic kidney disease: Secondary | ICD-10-CM | POA: Diagnosis not present

## 2015-08-08 DIAGNOSIS — N2581 Secondary hyperparathyroidism of renal origin: Secondary | ICD-10-CM | POA: Diagnosis not present

## 2015-08-08 DIAGNOSIS — Z23 Encounter for immunization: Secondary | ICD-10-CM | POA: Diagnosis not present

## 2015-08-10 DIAGNOSIS — N2581 Secondary hyperparathyroidism of renal origin: Secondary | ICD-10-CM | POA: Diagnosis not present

## 2015-08-10 DIAGNOSIS — D631 Anemia in chronic kidney disease: Secondary | ICD-10-CM | POA: Diagnosis not present

## 2015-08-10 DIAGNOSIS — N186 End stage renal disease: Secondary | ICD-10-CM | POA: Diagnosis not present

## 2015-08-10 DIAGNOSIS — Z23 Encounter for immunization: Secondary | ICD-10-CM | POA: Diagnosis not present

## 2015-08-12 DIAGNOSIS — N2581 Secondary hyperparathyroidism of renal origin: Secondary | ICD-10-CM | POA: Diagnosis not present

## 2015-08-12 DIAGNOSIS — N186 End stage renal disease: Secondary | ICD-10-CM | POA: Diagnosis not present

## 2015-08-12 DIAGNOSIS — Z23 Encounter for immunization: Secondary | ICD-10-CM | POA: Diagnosis not present

## 2015-08-12 DIAGNOSIS — D631 Anemia in chronic kidney disease: Secondary | ICD-10-CM | POA: Diagnosis not present

## 2015-08-15 DIAGNOSIS — D631 Anemia in chronic kidney disease: Secondary | ICD-10-CM | POA: Diagnosis not present

## 2015-08-15 DIAGNOSIS — N2581 Secondary hyperparathyroidism of renal origin: Secondary | ICD-10-CM | POA: Diagnosis not present

## 2015-08-15 DIAGNOSIS — N186 End stage renal disease: Secondary | ICD-10-CM | POA: Diagnosis not present

## 2015-08-15 DIAGNOSIS — Z23 Encounter for immunization: Secondary | ICD-10-CM | POA: Diagnosis not present

## 2015-08-17 DIAGNOSIS — Z23 Encounter for immunization: Secondary | ICD-10-CM | POA: Diagnosis not present

## 2015-08-17 DIAGNOSIS — N186 End stage renal disease: Secondary | ICD-10-CM | POA: Diagnosis not present

## 2015-08-17 DIAGNOSIS — D631 Anemia in chronic kidney disease: Secondary | ICD-10-CM | POA: Diagnosis not present

## 2015-08-17 DIAGNOSIS — N2581 Secondary hyperparathyroidism of renal origin: Secondary | ICD-10-CM | POA: Diagnosis not present

## 2015-08-18 DIAGNOSIS — I871 Compression of vein: Secondary | ICD-10-CM | POA: Diagnosis not present

## 2015-08-18 DIAGNOSIS — T82858D Stenosis of vascular prosthetic devices, implants and grafts, subsequent encounter: Secondary | ICD-10-CM | POA: Diagnosis not present

## 2015-08-18 DIAGNOSIS — Z992 Dependence on renal dialysis: Secondary | ICD-10-CM | POA: Diagnosis not present

## 2015-08-18 DIAGNOSIS — N186 End stage renal disease: Secondary | ICD-10-CM | POA: Diagnosis not present

## 2015-08-19 DIAGNOSIS — N186 End stage renal disease: Secondary | ICD-10-CM | POA: Diagnosis not present

## 2015-08-19 DIAGNOSIS — Z23 Encounter for immunization: Secondary | ICD-10-CM | POA: Diagnosis not present

## 2015-08-19 DIAGNOSIS — M3210 Systemic lupus erythematosus, organ or system involvement unspecified: Secondary | ICD-10-CM | POA: Diagnosis not present

## 2015-08-19 DIAGNOSIS — D631 Anemia in chronic kidney disease: Secondary | ICD-10-CM | POA: Diagnosis not present

## 2015-08-19 DIAGNOSIS — N2581 Secondary hyperparathyroidism of renal origin: Secondary | ICD-10-CM | POA: Diagnosis not present

## 2015-08-19 DIAGNOSIS — Z992 Dependence on renal dialysis: Secondary | ICD-10-CM | POA: Diagnosis not present

## 2015-08-22 DIAGNOSIS — D631 Anemia in chronic kidney disease: Secondary | ICD-10-CM | POA: Diagnosis not present

## 2015-08-22 DIAGNOSIS — N186 End stage renal disease: Secondary | ICD-10-CM | POA: Diagnosis not present

## 2015-08-22 DIAGNOSIS — N2581 Secondary hyperparathyroidism of renal origin: Secondary | ICD-10-CM | POA: Diagnosis not present

## 2015-08-24 DIAGNOSIS — D631 Anemia in chronic kidney disease: Secondary | ICD-10-CM | POA: Diagnosis not present

## 2015-08-24 DIAGNOSIS — N186 End stage renal disease: Secondary | ICD-10-CM | POA: Diagnosis not present

## 2015-08-24 DIAGNOSIS — H35383 Toxic maculopathy, bilateral: Secondary | ICD-10-CM | POA: Diagnosis not present

## 2015-08-24 DIAGNOSIS — N2581 Secondary hyperparathyroidism of renal origin: Secondary | ICD-10-CM | POA: Diagnosis not present

## 2015-08-26 DIAGNOSIS — D631 Anemia in chronic kidney disease: Secondary | ICD-10-CM | POA: Diagnosis not present

## 2015-08-26 DIAGNOSIS — N186 End stage renal disease: Secondary | ICD-10-CM | POA: Diagnosis not present

## 2015-08-26 DIAGNOSIS — N2581 Secondary hyperparathyroidism of renal origin: Secondary | ICD-10-CM | POA: Diagnosis not present

## 2015-08-29 DIAGNOSIS — N2581 Secondary hyperparathyroidism of renal origin: Secondary | ICD-10-CM | POA: Diagnosis not present

## 2015-08-29 DIAGNOSIS — N186 End stage renal disease: Secondary | ICD-10-CM | POA: Diagnosis not present

## 2015-08-29 DIAGNOSIS — D631 Anemia in chronic kidney disease: Secondary | ICD-10-CM | POA: Diagnosis not present

## 2015-08-31 DIAGNOSIS — N2581 Secondary hyperparathyroidism of renal origin: Secondary | ICD-10-CM | POA: Diagnosis not present

## 2015-08-31 DIAGNOSIS — N186 End stage renal disease: Secondary | ICD-10-CM | POA: Diagnosis not present

## 2015-08-31 DIAGNOSIS — D631 Anemia in chronic kidney disease: Secondary | ICD-10-CM | POA: Diagnosis not present

## 2015-09-02 DIAGNOSIS — D631 Anemia in chronic kidney disease: Secondary | ICD-10-CM | POA: Diagnosis not present

## 2015-09-02 DIAGNOSIS — N186 End stage renal disease: Secondary | ICD-10-CM | POA: Diagnosis not present

## 2015-09-02 DIAGNOSIS — N2581 Secondary hyperparathyroidism of renal origin: Secondary | ICD-10-CM | POA: Diagnosis not present

## 2015-09-05 DIAGNOSIS — D631 Anemia in chronic kidney disease: Secondary | ICD-10-CM | POA: Diagnosis not present

## 2015-09-05 DIAGNOSIS — N186 End stage renal disease: Secondary | ICD-10-CM | POA: Diagnosis not present

## 2015-09-05 DIAGNOSIS — N2581 Secondary hyperparathyroidism of renal origin: Secondary | ICD-10-CM | POA: Diagnosis not present

## 2015-09-07 DIAGNOSIS — D631 Anemia in chronic kidney disease: Secondary | ICD-10-CM | POA: Diagnosis not present

## 2015-09-07 DIAGNOSIS — N2581 Secondary hyperparathyroidism of renal origin: Secondary | ICD-10-CM | POA: Diagnosis not present

## 2015-09-07 DIAGNOSIS — N186 End stage renal disease: Secondary | ICD-10-CM | POA: Diagnosis not present

## 2015-09-09 DIAGNOSIS — N186 End stage renal disease: Secondary | ICD-10-CM | POA: Diagnosis not present

## 2015-09-09 DIAGNOSIS — N2581 Secondary hyperparathyroidism of renal origin: Secondary | ICD-10-CM | POA: Diagnosis not present

## 2015-09-09 DIAGNOSIS — D631 Anemia in chronic kidney disease: Secondary | ICD-10-CM | POA: Diagnosis not present

## 2015-09-12 DIAGNOSIS — N186 End stage renal disease: Secondary | ICD-10-CM | POA: Diagnosis not present

## 2015-09-12 DIAGNOSIS — D631 Anemia in chronic kidney disease: Secondary | ICD-10-CM | POA: Diagnosis not present

## 2015-09-12 DIAGNOSIS — N2581 Secondary hyperparathyroidism of renal origin: Secondary | ICD-10-CM | POA: Diagnosis not present

## 2015-09-14 DIAGNOSIS — D631 Anemia in chronic kidney disease: Secondary | ICD-10-CM | POA: Diagnosis not present

## 2015-09-14 DIAGNOSIS — N2581 Secondary hyperparathyroidism of renal origin: Secondary | ICD-10-CM | POA: Diagnosis not present

## 2015-09-14 DIAGNOSIS — N186 End stage renal disease: Secondary | ICD-10-CM | POA: Diagnosis not present

## 2015-09-16 DIAGNOSIS — D631 Anemia in chronic kidney disease: Secondary | ICD-10-CM | POA: Diagnosis not present

## 2015-09-16 DIAGNOSIS — N2581 Secondary hyperparathyroidism of renal origin: Secondary | ICD-10-CM | POA: Diagnosis not present

## 2015-09-16 DIAGNOSIS — N186 End stage renal disease: Secondary | ICD-10-CM | POA: Diagnosis not present

## 2015-09-19 DIAGNOSIS — N2581 Secondary hyperparathyroidism of renal origin: Secondary | ICD-10-CM | POA: Diagnosis not present

## 2015-09-19 DIAGNOSIS — M3210 Systemic lupus erythematosus, organ or system involvement unspecified: Secondary | ICD-10-CM | POA: Diagnosis not present

## 2015-09-19 DIAGNOSIS — N186 End stage renal disease: Secondary | ICD-10-CM | POA: Diagnosis not present

## 2015-09-19 DIAGNOSIS — Z992 Dependence on renal dialysis: Secondary | ICD-10-CM | POA: Diagnosis not present

## 2015-09-19 DIAGNOSIS — D631 Anemia in chronic kidney disease: Secondary | ICD-10-CM | POA: Diagnosis not present

## 2015-09-21 DIAGNOSIS — D631 Anemia in chronic kidney disease: Secondary | ICD-10-CM | POA: Diagnosis not present

## 2015-09-21 DIAGNOSIS — N186 End stage renal disease: Secondary | ICD-10-CM | POA: Diagnosis not present

## 2015-09-21 DIAGNOSIS — N2581 Secondary hyperparathyroidism of renal origin: Secondary | ICD-10-CM | POA: Diagnosis not present

## 2015-09-23 DIAGNOSIS — D631 Anemia in chronic kidney disease: Secondary | ICD-10-CM | POA: Diagnosis not present

## 2015-09-23 DIAGNOSIS — N186 End stage renal disease: Secondary | ICD-10-CM | POA: Diagnosis not present

## 2015-09-23 DIAGNOSIS — N2581 Secondary hyperparathyroidism of renal origin: Secondary | ICD-10-CM | POA: Diagnosis not present

## 2015-09-26 DIAGNOSIS — N186 End stage renal disease: Secondary | ICD-10-CM | POA: Diagnosis not present

## 2015-09-26 DIAGNOSIS — D631 Anemia in chronic kidney disease: Secondary | ICD-10-CM | POA: Diagnosis not present

## 2015-09-26 DIAGNOSIS — N2581 Secondary hyperparathyroidism of renal origin: Secondary | ICD-10-CM | POA: Diagnosis not present

## 2015-09-28 DIAGNOSIS — N2581 Secondary hyperparathyroidism of renal origin: Secondary | ICD-10-CM | POA: Diagnosis not present

## 2015-09-28 DIAGNOSIS — D631 Anemia in chronic kidney disease: Secondary | ICD-10-CM | POA: Diagnosis not present

## 2015-09-28 DIAGNOSIS — N186 End stage renal disease: Secondary | ICD-10-CM | POA: Diagnosis not present

## 2015-09-30 DIAGNOSIS — N186 End stage renal disease: Secondary | ICD-10-CM | POA: Diagnosis not present

## 2015-09-30 DIAGNOSIS — D631 Anemia in chronic kidney disease: Secondary | ICD-10-CM | POA: Diagnosis not present

## 2015-09-30 DIAGNOSIS — N2581 Secondary hyperparathyroidism of renal origin: Secondary | ICD-10-CM | POA: Diagnosis not present

## 2015-10-03 DIAGNOSIS — N186 End stage renal disease: Secondary | ICD-10-CM | POA: Diagnosis not present

## 2015-10-03 DIAGNOSIS — N2581 Secondary hyperparathyroidism of renal origin: Secondary | ICD-10-CM | POA: Diagnosis not present

## 2015-10-03 DIAGNOSIS — D631 Anemia in chronic kidney disease: Secondary | ICD-10-CM | POA: Diagnosis not present

## 2015-10-05 DIAGNOSIS — N2581 Secondary hyperparathyroidism of renal origin: Secondary | ICD-10-CM | POA: Diagnosis not present

## 2015-10-05 DIAGNOSIS — N186 End stage renal disease: Secondary | ICD-10-CM | POA: Diagnosis not present

## 2015-10-05 DIAGNOSIS — D631 Anemia in chronic kidney disease: Secondary | ICD-10-CM | POA: Diagnosis not present

## 2015-10-07 DIAGNOSIS — D631 Anemia in chronic kidney disease: Secondary | ICD-10-CM | POA: Diagnosis not present

## 2015-10-07 DIAGNOSIS — N186 End stage renal disease: Secondary | ICD-10-CM | POA: Diagnosis not present

## 2015-10-07 DIAGNOSIS — N2581 Secondary hyperparathyroidism of renal origin: Secondary | ICD-10-CM | POA: Diagnosis not present

## 2015-10-10 DIAGNOSIS — N2581 Secondary hyperparathyroidism of renal origin: Secondary | ICD-10-CM | POA: Diagnosis not present

## 2015-10-10 DIAGNOSIS — N186 End stage renal disease: Secondary | ICD-10-CM | POA: Diagnosis not present

## 2015-10-10 DIAGNOSIS — D631 Anemia in chronic kidney disease: Secondary | ICD-10-CM | POA: Diagnosis not present

## 2015-10-12 DIAGNOSIS — D631 Anemia in chronic kidney disease: Secondary | ICD-10-CM | POA: Diagnosis not present

## 2015-10-12 DIAGNOSIS — N186 End stage renal disease: Secondary | ICD-10-CM | POA: Diagnosis not present

## 2015-10-12 DIAGNOSIS — N2581 Secondary hyperparathyroidism of renal origin: Secondary | ICD-10-CM | POA: Diagnosis not present

## 2015-10-15 DIAGNOSIS — D631 Anemia in chronic kidney disease: Secondary | ICD-10-CM | POA: Diagnosis not present

## 2015-10-15 DIAGNOSIS — N2581 Secondary hyperparathyroidism of renal origin: Secondary | ICD-10-CM | POA: Diagnosis not present

## 2015-10-15 DIAGNOSIS — N186 End stage renal disease: Secondary | ICD-10-CM | POA: Diagnosis not present

## 2015-10-17 DIAGNOSIS — D631 Anemia in chronic kidney disease: Secondary | ICD-10-CM | POA: Diagnosis not present

## 2015-10-17 DIAGNOSIS — N186 End stage renal disease: Secondary | ICD-10-CM | POA: Diagnosis not present

## 2015-10-17 DIAGNOSIS — N2581 Secondary hyperparathyroidism of renal origin: Secondary | ICD-10-CM | POA: Diagnosis not present

## 2015-10-19 DIAGNOSIS — N186 End stage renal disease: Secondary | ICD-10-CM | POA: Diagnosis not present

## 2015-10-19 DIAGNOSIS — M3210 Systemic lupus erythematosus, organ or system involvement unspecified: Secondary | ICD-10-CM | POA: Diagnosis not present

## 2015-10-19 DIAGNOSIS — Z992 Dependence on renal dialysis: Secondary | ICD-10-CM | POA: Diagnosis not present

## 2015-10-19 DIAGNOSIS — N2581 Secondary hyperparathyroidism of renal origin: Secondary | ICD-10-CM | POA: Diagnosis not present

## 2015-10-19 DIAGNOSIS — D631 Anemia in chronic kidney disease: Secondary | ICD-10-CM | POA: Diagnosis not present

## 2015-10-21 DIAGNOSIS — N2581 Secondary hyperparathyroidism of renal origin: Secondary | ICD-10-CM | POA: Diagnosis not present

## 2015-10-21 DIAGNOSIS — D631 Anemia in chronic kidney disease: Secondary | ICD-10-CM | POA: Diagnosis not present

## 2015-10-21 DIAGNOSIS — D689 Coagulation defect, unspecified: Secondary | ICD-10-CM | POA: Diagnosis not present

## 2015-10-21 DIAGNOSIS — N186 End stage renal disease: Secondary | ICD-10-CM | POA: Diagnosis not present

## 2015-10-24 DIAGNOSIS — N2581 Secondary hyperparathyroidism of renal origin: Secondary | ICD-10-CM | POA: Diagnosis not present

## 2015-10-24 DIAGNOSIS — N186 End stage renal disease: Secondary | ICD-10-CM | POA: Diagnosis not present

## 2015-10-24 DIAGNOSIS — D631 Anemia in chronic kidney disease: Secondary | ICD-10-CM | POA: Diagnosis not present

## 2015-10-24 DIAGNOSIS — D689 Coagulation defect, unspecified: Secondary | ICD-10-CM | POA: Diagnosis not present

## 2015-10-25 ENCOUNTER — Encounter: Payer: Self-pay | Admitting: Internal Medicine

## 2015-10-25 ENCOUNTER — Ambulatory Visit (INDEPENDENT_AMBULATORY_CARE_PROVIDER_SITE_OTHER): Payer: Medicare Other | Admitting: Internal Medicine

## 2015-10-25 VITALS — BP 128/58 | HR 81 | Temp 97.9°F | Resp 12 | Wt 118.0 lb

## 2015-10-25 DIAGNOSIS — F4323 Adjustment disorder with mixed anxiety and depressed mood: Secondary | ICD-10-CM | POA: Insufficient documentation

## 2015-10-25 MED ORDER — BUSPIRONE HCL 5 MG PO TABS: 5.0000 mg | ORAL_TABLET | Freq: Two times a day (BID) | ORAL | Status: DC | PRN

## 2015-10-25 MED ORDER — PAROXETINE HCL 20 MG PO TABS: 20.0000 mg | ORAL_TABLET | Freq: Every day | ORAL | Status: DC

## 2015-10-25 NOTE — Progress Notes (Signed)
Pre visit review using our clinic review tool, if applicable. No additional management support is needed unless otherwise documented below in the visit note. 

## 2015-10-25 NOTE — Patient Instructions (Signed)
We have sent in a medicine for the anxiety and the depression that you take everyday called paroxetine (paxil). It can take 4 weeks to take full effect. We would like you to call us in about 4 weeks and let us know how you are doing. Most of the time people need some dose adjustment as we start you on the low dose.   We have also sent in the medicine called buspar. It is a safe medicine that you can take 1 pill up to twice a day as needed for anxiety. It can be helpful until the other medicine has taken effect.  We generally also advise people to work on some mild form of exercise to help to boost the happy hormones in your brain.   Stress and Stress Management Stress is a normal reaction to life events. It is what you feel when life demands more than you are used to or more than you can handle. Some stress can be useful. For example, the stress reaction can help you catch the last bus of the day, study for a test, or meet a deadline at work. But stress that occurs too often or for too long can cause problems. It can affect your emotional health and interfere with relationships and normal daily activities. Too much stress can weaken your immune system and increase your risk for physical illness. If you already have a medical problem, stress can make it worse. CAUSES  All sorts of life events may cause stress. An event that causes stress for one person may not be stressful for another person. Major life events commonly cause stress. These may be positive or negative. Examples include losing your job, moving into a new home, getting married, having a baby, or losing a loved one. Less obvious life events may also cause stress, especially if they occur day after day or in combination. Examples include working long hours, driving in traffic, caring for children, being in debt, or being in a difficult relationship. SIGNS AND SYMPTOMS Stress may cause emotional symptoms including, the following:  Anxiety. This  is feeling worried, afraid, on edge, overwhelmed, or out of control.  Anger. This is feeling irritated or impatient.  Depression. This is feeling sad, down, helpless, or guilty.  Difficulty focusing, remembering, or making decisions. Stress may cause physical symptoms, including the following:   Aches and pains. These may affect your head, neck, back, stomach, or other areas of your body.  Tight muscles or clenched jaw.  Low energy or trouble sleeping. Stress may cause unhealthy behaviors, including the following:   Eating to feel better (overeating) or skipping meals.  Sleeping too little, too much, or both.  Working too much or putting off tasks (procrastination).  Smoking, drinking alcohol, or using drugs to feel better. DIAGNOSIS  Stress is diagnosed through an assessment by your health care provider. Your health care provider will ask questions about your symptoms and any stressful life events.Your health care provider will also ask about your medical history and may order blood tests or other tests. Certain medical conditions and medicine can cause physical symptoms similar to stress. Mental illness can cause emotional symptoms and unhealthy behaviors similar to stress. Your health care provider may refer you to a mental health professional for further evaluation.  TREATMENT  Stress management is the recommended treatment for stress.The goals of stress management are reducing stressful life events and coping with stress in healthy ways.  Techniques for reducing stressful life events include the following:  Stress identification. Self-monitor for stress and identify what causes stress for you. These skills may help you to avoid some stressful events.  Time management. Set your priorities, keep a calendar of events, and learn to say "no." These tools can help you avoid making too many commitments. Techniques for coping with stress include the following:  Rethinking the  problem. Try to think realistically about stressful events rather than ignoring them or overreacting. Try to find the positives in a stressful situation rather than focusing on the negatives.  Exercise. Physical exercise can release both physical and emotional tension. The key is to find a form of exercise you enjoy and do it regularly.  Relaxation techniques. These relax the body and mind. Examples include yoga, meditation, tai chi, biofeedback, deep breathing, progressive muscle relaxation, listening to music, being out in nature, journaling, and other hobbies. Again, the key is to find one or more that you enjoy and can do regularly.  Healthy lifestyle. Eat a balanced diet, get plenty of sleep, and do not smoke. Avoid using alcohol or drugs to relax.  Strong support network. Spend time with family, friends, or other people you enjoy being around.Express your feelings and talk things over with someone you trust. Counseling or talktherapy with a mental health professional may be helpful if you are having difficulty managing stress on your own. Medicine is typically not recommended for the treatment of stress.Talk to your health care provider if you think you need medicine for symptoms of stress. HOME CARE INSTRUCTIONS  Keep all follow-up visits as directed by your health care provider.  Take all medicines as directed by your health care provider. SEEK MEDICAL CARE IF:  Your symptoms get worse or you start having new symptoms.  You feel overwhelmed by your problems and can no longer manage them on your own. SEEK IMMEDIATE MEDICAL CARE IF:  You feel like hurting yourself or someone else.   This information is not intended to replace advice given to you by your health care provider. Make sure you discuss any questions you have with your health care provider.   Document Released: 05/01/2001 Document Revised: 11/26/2014 Document Reviewed: 06/30/2013 Elsevier Interactive Patient Education  [employer].

## 2015-10-25 NOTE — Assessment & Plan Note (Signed)
Rx for paroxetine for her depression and anxiety. Also rx for buspar for the anxiety until this is working. She will call back in 4 weeks to let us know how she is doing. Sooner with any side effects.

## 2015-10-25 NOTE — Progress Notes (Signed)
   Subjective:    Patient ID: [name], female    DOB: [birth year], 39 y.o.   MRN: [id number]  HPI The patient is a 39 YO female coming in for some depression and anxiety. Being on dialysis has caused some of the depression. She is also having some things going on her personal life right now. Her emotions are labile and she is crying easily. She is sad a lot and not motivated to do much. Sleeping okay with the trazodone. No SI/HI.   Review of Systems  Constitutional: Positive for fatigue. Negative for fever, activity change and appetite change.  HENT: Negative.   Eyes: Negative.   Respiratory: Negative for cough, chest tightness, shortness of breath and wheezing.   Cardiovascular: Negative for chest pain, palpitations and leg swelling.  Gastrointestinal: Negative for nausea, abdominal pain, diarrhea, constipation and abdominal distention.  Musculoskeletal: Positive for myalgias.       Occasional  Skin: Negative.   Neurological: Negative.   Psychiatric/Behavioral: Positive for dysphoric mood and decreased concentration. Negative for suicidal ideas, behavioral problems, sleep disturbance, self-injury and agitation. The patient is nervous/anxious. The patient is not hyperactive.       Objective:   Physical Exam  Constitutional: She is oriented to person, place, and time. She appears well-developed and well-nourished.  HENT:  Head: Normocephalic and atraumatic.  Eyes: EOM are normal.  Neck: Normal range of motion.  Cardiovascular: Normal rate and regular rhythm.   Flow murmur, vascular access with good thrill and bruit left upper arm  Pulmonary/Chest: Effort normal. No respiratory distress. She has no wheezes. She has no rales.  Abdominal: Soft. She exhibits no distension. There is no tenderness. There is no rebound.  Musculoskeletal: She exhibits edema.  Neurological: She is alert and oriented to person, place, and time. Coordination normal.  Skin: Skin is warm and dry.    Psychiatric:  Slightly tearful during the visit.    Filed Vitals:   10/25/15 0806  BP: 128/58  Pulse: 81  Temp: 97.9 F (36.6 C)  TempSrc: Oral  Resp: 12  Weight: 118 lb (53.524 kg)  SpO2: 90%      Assessment & Plan:

## 2015-10-26 DIAGNOSIS — D631 Anemia in chronic kidney disease: Secondary | ICD-10-CM | POA: Diagnosis not present

## 2015-10-26 DIAGNOSIS — D689 Coagulation defect, unspecified: Secondary | ICD-10-CM | POA: Diagnosis not present

## 2015-10-26 DIAGNOSIS — N2581 Secondary hyperparathyroidism of renal origin: Secondary | ICD-10-CM | POA: Diagnosis not present

## 2015-10-26 DIAGNOSIS — N186 End stage renal disease: Secondary | ICD-10-CM | POA: Diagnosis not present

## 2015-10-28 DIAGNOSIS — D631 Anemia in chronic kidney disease: Secondary | ICD-10-CM | POA: Diagnosis not present

## 2015-10-28 DIAGNOSIS — N186 End stage renal disease: Secondary | ICD-10-CM | POA: Diagnosis not present

## 2015-10-28 DIAGNOSIS — N2581 Secondary hyperparathyroidism of renal origin: Secondary | ICD-10-CM | POA: Diagnosis not present

## 2015-10-28 DIAGNOSIS — D689 Coagulation defect, unspecified: Secondary | ICD-10-CM | POA: Diagnosis not present

## 2015-10-31 DIAGNOSIS — N2581 Secondary hyperparathyroidism of renal origin: Secondary | ICD-10-CM | POA: Diagnosis not present

## 2015-10-31 DIAGNOSIS — N186 End stage renal disease: Secondary | ICD-10-CM | POA: Diagnosis not present

## 2015-10-31 DIAGNOSIS — D689 Coagulation defect, unspecified: Secondary | ICD-10-CM | POA: Diagnosis not present

## 2015-10-31 DIAGNOSIS — D631 Anemia in chronic kidney disease: Secondary | ICD-10-CM | POA: Diagnosis not present

## 2015-11-02 DIAGNOSIS — D689 Coagulation defect, unspecified: Secondary | ICD-10-CM | POA: Diagnosis not present

## 2015-11-02 DIAGNOSIS — N2581 Secondary hyperparathyroidism of renal origin: Secondary | ICD-10-CM | POA: Diagnosis not present

## 2015-11-02 DIAGNOSIS — N186 End stage renal disease: Secondary | ICD-10-CM | POA: Diagnosis not present

## 2015-11-02 DIAGNOSIS — D631 Anemia in chronic kidney disease: Secondary | ICD-10-CM | POA: Diagnosis not present

## 2015-11-04 DIAGNOSIS — N2581 Secondary hyperparathyroidism of renal origin: Secondary | ICD-10-CM | POA: Diagnosis not present

## 2015-11-04 DIAGNOSIS — D631 Anemia in chronic kidney disease: Secondary | ICD-10-CM | POA: Diagnosis not present

## 2015-11-04 DIAGNOSIS — N186 End stage renal disease: Secondary | ICD-10-CM | POA: Diagnosis not present

## 2015-11-04 DIAGNOSIS — D689 Coagulation defect, unspecified: Secondary | ICD-10-CM | POA: Diagnosis not present

## 2015-11-07 DIAGNOSIS — N2581 Secondary hyperparathyroidism of renal origin: Secondary | ICD-10-CM | POA: Diagnosis not present

## 2015-11-07 DIAGNOSIS — N186 End stage renal disease: Secondary | ICD-10-CM | POA: Diagnosis not present

## 2015-11-07 DIAGNOSIS — D631 Anemia in chronic kidney disease: Secondary | ICD-10-CM | POA: Diagnosis not present

## 2015-11-07 DIAGNOSIS — D689 Coagulation defect, unspecified: Secondary | ICD-10-CM | POA: Diagnosis not present

## 2015-11-09 DIAGNOSIS — D689 Coagulation defect, unspecified: Secondary | ICD-10-CM | POA: Diagnosis not present

## 2015-11-09 DIAGNOSIS — N186 End stage renal disease: Secondary | ICD-10-CM | POA: Diagnosis not present

## 2015-11-09 DIAGNOSIS — N2581 Secondary hyperparathyroidism of renal origin: Secondary | ICD-10-CM | POA: Diagnosis not present

## 2015-11-09 DIAGNOSIS — D631 Anemia in chronic kidney disease: Secondary | ICD-10-CM | POA: Diagnosis not present

## 2015-11-11 DIAGNOSIS — N186 End stage renal disease: Secondary | ICD-10-CM | POA: Diagnosis not present

## 2015-11-11 DIAGNOSIS — D631 Anemia in chronic kidney disease: Secondary | ICD-10-CM | POA: Diagnosis not present

## 2015-11-11 DIAGNOSIS — N2581 Secondary hyperparathyroidism of renal origin: Secondary | ICD-10-CM | POA: Diagnosis not present

## 2015-11-11 DIAGNOSIS — D689 Coagulation defect, unspecified: Secondary | ICD-10-CM | POA: Diagnosis not present

## 2015-11-14 DIAGNOSIS — N186 End stage renal disease: Secondary | ICD-10-CM | POA: Diagnosis not present

## 2015-11-14 DIAGNOSIS — D689 Coagulation defect, unspecified: Secondary | ICD-10-CM | POA: Diagnosis not present

## 2015-11-14 DIAGNOSIS — N2581 Secondary hyperparathyroidism of renal origin: Secondary | ICD-10-CM | POA: Diagnosis not present

## 2015-11-14 DIAGNOSIS — D631 Anemia in chronic kidney disease: Secondary | ICD-10-CM | POA: Diagnosis not present

## 2015-11-16 DIAGNOSIS — N186 End stage renal disease: Secondary | ICD-10-CM | POA: Diagnosis not present

## 2015-11-16 DIAGNOSIS — D631 Anemia in chronic kidney disease: Secondary | ICD-10-CM | POA: Diagnosis not present

## 2015-11-16 DIAGNOSIS — D689 Coagulation defect, unspecified: Secondary | ICD-10-CM | POA: Diagnosis not present

## 2015-11-16 DIAGNOSIS — N2581 Secondary hyperparathyroidism of renal origin: Secondary | ICD-10-CM | POA: Diagnosis not present

## 2015-11-18 DIAGNOSIS — D631 Anemia in chronic kidney disease: Secondary | ICD-10-CM | POA: Diagnosis not present

## 2015-11-18 DIAGNOSIS — N186 End stage renal disease: Secondary | ICD-10-CM | POA: Diagnosis not present

## 2015-11-18 DIAGNOSIS — D689 Coagulation defect, unspecified: Secondary | ICD-10-CM | POA: Diagnosis not present

## 2015-11-18 DIAGNOSIS — N2581 Secondary hyperparathyroidism of renal origin: Secondary | ICD-10-CM | POA: Diagnosis not present

## 2015-11-19 DIAGNOSIS — Z992 Dependence on renal dialysis: Secondary | ICD-10-CM | POA: Diagnosis not present

## 2015-11-19 DIAGNOSIS — M3210 Systemic lupus erythematosus, organ or system involvement unspecified: Secondary | ICD-10-CM | POA: Diagnosis not present

## 2015-11-19 DIAGNOSIS — N186 End stage renal disease: Secondary | ICD-10-CM | POA: Diagnosis not present

## 2015-11-21 DIAGNOSIS — R0989 Other specified symptoms and signs involving the circulatory and respiratory systems: Secondary | ICD-10-CM | POA: Diagnosis not present

## 2015-11-21 DIAGNOSIS — N2581 Secondary hyperparathyroidism of renal origin: Secondary | ICD-10-CM | POA: Diagnosis not present

## 2015-11-21 DIAGNOSIS — D631 Anemia in chronic kidney disease: Secondary | ICD-10-CM | POA: Diagnosis not present

## 2015-11-21 DIAGNOSIS — N186 End stage renal disease: Secondary | ICD-10-CM | POA: Diagnosis not present

## 2015-11-23 DIAGNOSIS — R0989 Other specified symptoms and signs involving the circulatory and respiratory systems: Secondary | ICD-10-CM | POA: Diagnosis not present

## 2015-11-23 DIAGNOSIS — N186 End stage renal disease: Secondary | ICD-10-CM | POA: Diagnosis not present

## 2015-11-23 DIAGNOSIS — N2581 Secondary hyperparathyroidism of renal origin: Secondary | ICD-10-CM | POA: Diagnosis not present

## 2015-11-23 DIAGNOSIS — D631 Anemia in chronic kidney disease: Secondary | ICD-10-CM | POA: Diagnosis not present

## 2015-11-25 DIAGNOSIS — R0989 Other specified symptoms and signs involving the circulatory and respiratory systems: Secondary | ICD-10-CM | POA: Diagnosis not present

## 2015-11-25 DIAGNOSIS — D631 Anemia in chronic kidney disease: Secondary | ICD-10-CM | POA: Diagnosis not present

## 2015-11-25 DIAGNOSIS — N186 End stage renal disease: Secondary | ICD-10-CM | POA: Diagnosis not present

## 2015-11-25 DIAGNOSIS — N2581 Secondary hyperparathyroidism of renal origin: Secondary | ICD-10-CM | POA: Diagnosis not present

## 2015-11-28 DIAGNOSIS — R0989 Other specified symptoms and signs involving the circulatory and respiratory systems: Secondary | ICD-10-CM | POA: Diagnosis not present

## 2015-11-28 DIAGNOSIS — D631 Anemia in chronic kidney disease: Secondary | ICD-10-CM | POA: Diagnosis not present

## 2015-11-28 DIAGNOSIS — N2581 Secondary hyperparathyroidism of renal origin: Secondary | ICD-10-CM | POA: Diagnosis not present

## 2015-11-28 DIAGNOSIS — N186 End stage renal disease: Secondary | ICD-10-CM | POA: Diagnosis not present

## 2015-11-30 DIAGNOSIS — N186 End stage renal disease: Secondary | ICD-10-CM | POA: Diagnosis not present

## 2015-11-30 DIAGNOSIS — D631 Anemia in chronic kidney disease: Secondary | ICD-10-CM | POA: Diagnosis not present

## 2015-11-30 DIAGNOSIS — N2581 Secondary hyperparathyroidism of renal origin: Secondary | ICD-10-CM | POA: Diagnosis not present

## 2015-11-30 DIAGNOSIS — R0989 Other specified symptoms and signs involving the circulatory and respiratory systems: Secondary | ICD-10-CM | POA: Diagnosis not present

## 2015-12-02 DIAGNOSIS — N2581 Secondary hyperparathyroidism of renal origin: Secondary | ICD-10-CM | POA: Diagnosis not present

## 2015-12-02 DIAGNOSIS — N186 End stage renal disease: Secondary | ICD-10-CM | POA: Diagnosis not present

## 2015-12-02 DIAGNOSIS — R0989 Other specified symptoms and signs involving the circulatory and respiratory systems: Secondary | ICD-10-CM | POA: Diagnosis not present

## 2015-12-02 DIAGNOSIS — D631 Anemia in chronic kidney disease: Secondary | ICD-10-CM | POA: Diagnosis not present

## 2015-12-05 DIAGNOSIS — N186 End stage renal disease: Secondary | ICD-10-CM | POA: Diagnosis not present

## 2015-12-05 DIAGNOSIS — D631 Anemia in chronic kidney disease: Secondary | ICD-10-CM | POA: Diagnosis not present

## 2015-12-05 DIAGNOSIS — R0989 Other specified symptoms and signs involving the circulatory and respiratory systems: Secondary | ICD-10-CM | POA: Diagnosis not present

## 2015-12-05 DIAGNOSIS — N2581 Secondary hyperparathyroidism of renal origin: Secondary | ICD-10-CM | POA: Diagnosis not present

## 2015-12-07 DIAGNOSIS — N2581 Secondary hyperparathyroidism of renal origin: Secondary | ICD-10-CM | POA: Diagnosis not present

## 2015-12-07 DIAGNOSIS — R0989 Other specified symptoms and signs involving the circulatory and respiratory systems: Secondary | ICD-10-CM | POA: Diagnosis not present

## 2015-12-07 DIAGNOSIS — N186 End stage renal disease: Secondary | ICD-10-CM | POA: Diagnosis not present

## 2015-12-07 DIAGNOSIS — D631 Anemia in chronic kidney disease: Secondary | ICD-10-CM | POA: Diagnosis not present

## 2015-12-09 DIAGNOSIS — R0989 Other specified symptoms and signs involving the circulatory and respiratory systems: Secondary | ICD-10-CM | POA: Diagnosis not present

## 2015-12-09 DIAGNOSIS — D631 Anemia in chronic kidney disease: Secondary | ICD-10-CM | POA: Diagnosis not present

## 2015-12-09 DIAGNOSIS — N186 End stage renal disease: Secondary | ICD-10-CM | POA: Diagnosis not present

## 2015-12-09 DIAGNOSIS — N2581 Secondary hyperparathyroidism of renal origin: Secondary | ICD-10-CM | POA: Diagnosis not present

## 2015-12-12 DIAGNOSIS — D631 Anemia in chronic kidney disease: Secondary | ICD-10-CM | POA: Diagnosis not present

## 2015-12-12 DIAGNOSIS — N2581 Secondary hyperparathyroidism of renal origin: Secondary | ICD-10-CM | POA: Diagnosis not present

## 2015-12-12 DIAGNOSIS — N186 End stage renal disease: Secondary | ICD-10-CM | POA: Diagnosis not present

## 2015-12-12 DIAGNOSIS — R0989 Other specified symptoms and signs involving the circulatory and respiratory systems: Secondary | ICD-10-CM | POA: Diagnosis not present

## 2015-12-14 DIAGNOSIS — N186 End stage renal disease: Secondary | ICD-10-CM | POA: Diagnosis not present

## 2015-12-14 DIAGNOSIS — R0989 Other specified symptoms and signs involving the circulatory and respiratory systems: Secondary | ICD-10-CM | POA: Diagnosis not present

## 2015-12-14 DIAGNOSIS — D631 Anemia in chronic kidney disease: Secondary | ICD-10-CM | POA: Diagnosis not present

## 2015-12-14 DIAGNOSIS — N2581 Secondary hyperparathyroidism of renal origin: Secondary | ICD-10-CM | POA: Diagnosis not present

## 2015-12-16 DIAGNOSIS — N2581 Secondary hyperparathyroidism of renal origin: Secondary | ICD-10-CM | POA: Diagnosis not present

## 2015-12-16 DIAGNOSIS — D631 Anemia in chronic kidney disease: Secondary | ICD-10-CM | POA: Diagnosis not present

## 2015-12-16 DIAGNOSIS — N186 End stage renal disease: Secondary | ICD-10-CM | POA: Diagnosis not present

## 2015-12-16 DIAGNOSIS — R0989 Other specified symptoms and signs involving the circulatory and respiratory systems: Secondary | ICD-10-CM | POA: Diagnosis not present

## 2015-12-19 DIAGNOSIS — D631 Anemia in chronic kidney disease: Secondary | ICD-10-CM | POA: Diagnosis not present

## 2015-12-19 DIAGNOSIS — N186 End stage renal disease: Secondary | ICD-10-CM | POA: Diagnosis not present

## 2015-12-19 DIAGNOSIS — N2581 Secondary hyperparathyroidism of renal origin: Secondary | ICD-10-CM | POA: Diagnosis not present

## 2015-12-19 DIAGNOSIS — R0989 Other specified symptoms and signs involving the circulatory and respiratory systems: Secondary | ICD-10-CM | POA: Diagnosis not present

## 2015-12-20 DIAGNOSIS — N186 End stage renal disease: Secondary | ICD-10-CM | POA: Diagnosis not present

## 2015-12-20 DIAGNOSIS — Z992 Dependence on renal dialysis: Secondary | ICD-10-CM | POA: Diagnosis not present

## 2015-12-20 DIAGNOSIS — M3210 Systemic lupus erythematosus, organ or system involvement unspecified: Secondary | ICD-10-CM | POA: Diagnosis not present

## 2015-12-21 DIAGNOSIS — N2581 Secondary hyperparathyroidism of renal origin: Secondary | ICD-10-CM | POA: Diagnosis not present

## 2015-12-21 DIAGNOSIS — D631 Anemia in chronic kidney disease: Secondary | ICD-10-CM | POA: Diagnosis not present

## 2015-12-21 DIAGNOSIS — N186 End stage renal disease: Secondary | ICD-10-CM | POA: Diagnosis not present

## 2015-12-23 DIAGNOSIS — D631 Anemia in chronic kidney disease: Secondary | ICD-10-CM | POA: Diagnosis not present

## 2015-12-23 DIAGNOSIS — N186 End stage renal disease: Secondary | ICD-10-CM | POA: Diagnosis not present

## 2015-12-23 DIAGNOSIS — N2581 Secondary hyperparathyroidism of renal origin: Secondary | ICD-10-CM | POA: Diagnosis not present

## 2015-12-26 DIAGNOSIS — D631 Anemia in chronic kidney disease: Secondary | ICD-10-CM | POA: Diagnosis not present

## 2015-12-26 DIAGNOSIS — N2581 Secondary hyperparathyroidism of renal origin: Secondary | ICD-10-CM | POA: Diagnosis not present

## 2015-12-26 DIAGNOSIS — N186 End stage renal disease: Secondary | ICD-10-CM | POA: Diagnosis not present

## 2015-12-28 DIAGNOSIS — D631 Anemia in chronic kidney disease: Secondary | ICD-10-CM | POA: Diagnosis not present

## 2015-12-28 DIAGNOSIS — N186 End stage renal disease: Secondary | ICD-10-CM | POA: Diagnosis not present

## 2015-12-28 DIAGNOSIS — N2581 Secondary hyperparathyroidism of renal origin: Secondary | ICD-10-CM | POA: Diagnosis not present

## 2015-12-30 DIAGNOSIS — N186 End stage renal disease: Secondary | ICD-10-CM | POA: Diagnosis not present

## 2015-12-30 DIAGNOSIS — N2581 Secondary hyperparathyroidism of renal origin: Secondary | ICD-10-CM | POA: Diagnosis not present

## 2015-12-30 DIAGNOSIS — D631 Anemia in chronic kidney disease: Secondary | ICD-10-CM | POA: Diagnosis not present

## 2016-01-02 DIAGNOSIS — N2581 Secondary hyperparathyroidism of renal origin: Secondary | ICD-10-CM | POA: Diagnosis not present

## 2016-01-02 DIAGNOSIS — D631 Anemia in chronic kidney disease: Secondary | ICD-10-CM | POA: Diagnosis not present

## 2016-01-02 DIAGNOSIS — N186 End stage renal disease: Secondary | ICD-10-CM | POA: Diagnosis not present

## 2016-01-04 DIAGNOSIS — D631 Anemia in chronic kidney disease: Secondary | ICD-10-CM | POA: Diagnosis not present

## 2016-01-04 DIAGNOSIS — N186 End stage renal disease: Secondary | ICD-10-CM | POA: Diagnosis not present

## 2016-01-04 DIAGNOSIS — N2581 Secondary hyperparathyroidism of renal origin: Secondary | ICD-10-CM | POA: Diagnosis not present

## 2016-01-06 DIAGNOSIS — N186 End stage renal disease: Secondary | ICD-10-CM | POA: Diagnosis not present

## 2016-01-06 DIAGNOSIS — D631 Anemia in chronic kidney disease: Secondary | ICD-10-CM | POA: Diagnosis not present

## 2016-01-06 DIAGNOSIS — N2581 Secondary hyperparathyroidism of renal origin: Secondary | ICD-10-CM | POA: Diagnosis not present

## 2016-01-09 DIAGNOSIS — N2581 Secondary hyperparathyroidism of renal origin: Secondary | ICD-10-CM | POA: Diagnosis not present

## 2016-01-09 DIAGNOSIS — D631 Anemia in chronic kidney disease: Secondary | ICD-10-CM | POA: Diagnosis not present

## 2016-01-09 DIAGNOSIS — N186 End stage renal disease: Secondary | ICD-10-CM | POA: Diagnosis not present

## 2016-01-11 DIAGNOSIS — D631 Anemia in chronic kidney disease: Secondary | ICD-10-CM | POA: Diagnosis not present

## 2016-01-11 DIAGNOSIS — N186 End stage renal disease: Secondary | ICD-10-CM | POA: Diagnosis not present

## 2016-01-11 DIAGNOSIS — N2581 Secondary hyperparathyroidism of renal origin: Secondary | ICD-10-CM | POA: Diagnosis not present

## 2016-01-13 DIAGNOSIS — N2581 Secondary hyperparathyroidism of renal origin: Secondary | ICD-10-CM | POA: Diagnosis not present

## 2016-01-13 DIAGNOSIS — D631 Anemia in chronic kidney disease: Secondary | ICD-10-CM | POA: Diagnosis not present

## 2016-01-13 DIAGNOSIS — N186 End stage renal disease: Secondary | ICD-10-CM | POA: Diagnosis not present

## 2016-01-16 DIAGNOSIS — N186 End stage renal disease: Secondary | ICD-10-CM | POA: Diagnosis not present

## 2016-01-16 DIAGNOSIS — D631 Anemia in chronic kidney disease: Secondary | ICD-10-CM | POA: Diagnosis not present

## 2016-01-16 DIAGNOSIS — N2581 Secondary hyperparathyroidism of renal origin: Secondary | ICD-10-CM | POA: Diagnosis not present

## 2016-01-17 DIAGNOSIS — M3210 Systemic lupus erythematosus, organ or system involvement unspecified: Secondary | ICD-10-CM | POA: Diagnosis not present

## 2016-01-17 DIAGNOSIS — N186 End stage renal disease: Secondary | ICD-10-CM | POA: Diagnosis not present

## 2016-01-17 DIAGNOSIS — Z992 Dependence on renal dialysis: Secondary | ICD-10-CM | POA: Diagnosis not present

## 2016-01-18 DIAGNOSIS — N186 End stage renal disease: Secondary | ICD-10-CM | POA: Diagnosis not present

## 2016-01-18 DIAGNOSIS — N2581 Secondary hyperparathyroidism of renal origin: Secondary | ICD-10-CM | POA: Diagnosis not present

## 2016-01-20 DIAGNOSIS — N186 End stage renal disease: Secondary | ICD-10-CM | POA: Diagnosis not present

## 2016-01-20 DIAGNOSIS — N2581 Secondary hyperparathyroidism of renal origin: Secondary | ICD-10-CM | POA: Diagnosis not present

## 2016-01-23 DIAGNOSIS — N2581 Secondary hyperparathyroidism of renal origin: Secondary | ICD-10-CM | POA: Diagnosis not present

## 2016-01-23 DIAGNOSIS — N186 End stage renal disease: Secondary | ICD-10-CM | POA: Diagnosis not present

## 2016-01-25 DIAGNOSIS — N2581 Secondary hyperparathyroidism of renal origin: Secondary | ICD-10-CM | POA: Diagnosis not present

## 2016-01-25 DIAGNOSIS — N186 End stage renal disease: Secondary | ICD-10-CM | POA: Diagnosis not present

## 2016-01-27 DIAGNOSIS — N186 End stage renal disease: Secondary | ICD-10-CM | POA: Diagnosis not present

## 2016-01-27 DIAGNOSIS — N2581 Secondary hyperparathyroidism of renal origin: Secondary | ICD-10-CM | POA: Diagnosis not present

## 2016-01-30 DIAGNOSIS — N2581 Secondary hyperparathyroidism of renal origin: Secondary | ICD-10-CM | POA: Diagnosis not present

## 2016-01-30 DIAGNOSIS — N186 End stage renal disease: Secondary | ICD-10-CM | POA: Diagnosis not present

## 2016-02-01 DIAGNOSIS — N186 End stage renal disease: Secondary | ICD-10-CM | POA: Diagnosis not present

## 2016-02-01 DIAGNOSIS — N2581 Secondary hyperparathyroidism of renal origin: Secondary | ICD-10-CM | POA: Diagnosis not present

## 2016-02-03 DIAGNOSIS — N2581 Secondary hyperparathyroidism of renal origin: Secondary | ICD-10-CM | POA: Diagnosis not present

## 2016-02-03 DIAGNOSIS — N186 End stage renal disease: Secondary | ICD-10-CM | POA: Diagnosis not present

## 2016-02-06 DIAGNOSIS — N2581 Secondary hyperparathyroidism of renal origin: Secondary | ICD-10-CM | POA: Diagnosis not present

## 2016-02-06 DIAGNOSIS — N186 End stage renal disease: Secondary | ICD-10-CM | POA: Diagnosis not present

## 2016-02-08 DIAGNOSIS — N2581 Secondary hyperparathyroidism of renal origin: Secondary | ICD-10-CM | POA: Diagnosis not present

## 2016-02-08 DIAGNOSIS — N186 End stage renal disease: Secondary | ICD-10-CM | POA: Diagnosis not present

## 2016-02-10 DIAGNOSIS — N186 End stage renal disease: Secondary | ICD-10-CM | POA: Diagnosis not present

## 2016-02-10 DIAGNOSIS — N2581 Secondary hyperparathyroidism of renal origin: Secondary | ICD-10-CM | POA: Diagnosis not present

## 2016-02-13 DIAGNOSIS — N186 End stage renal disease: Secondary | ICD-10-CM | POA: Diagnosis not present

## 2016-02-13 DIAGNOSIS — N2581 Secondary hyperparathyroidism of renal origin: Secondary | ICD-10-CM | POA: Diagnosis not present

## 2016-02-15 DIAGNOSIS — N2581 Secondary hyperparathyroidism of renal origin: Secondary | ICD-10-CM | POA: Diagnosis not present

## 2016-02-15 DIAGNOSIS — N186 End stage renal disease: Secondary | ICD-10-CM | POA: Diagnosis not present

## 2016-02-17 DIAGNOSIS — N2581 Secondary hyperparathyroidism of renal origin: Secondary | ICD-10-CM | POA: Diagnosis not present

## 2016-02-17 DIAGNOSIS — Z992 Dependence on renal dialysis: Secondary | ICD-10-CM | POA: Diagnosis not present

## 2016-02-17 DIAGNOSIS — N186 End stage renal disease: Secondary | ICD-10-CM | POA: Diagnosis not present

## 2016-02-17 DIAGNOSIS — M3210 Systemic lupus erythematosus, organ or system involvement unspecified: Secondary | ICD-10-CM | POA: Diagnosis not present

## 2016-02-20 DIAGNOSIS — D631 Anemia in chronic kidney disease: Secondary | ICD-10-CM | POA: Diagnosis not present

## 2016-02-20 DIAGNOSIS — N2581 Secondary hyperparathyroidism of renal origin: Secondary | ICD-10-CM | POA: Diagnosis not present

## 2016-02-20 DIAGNOSIS — N186 End stage renal disease: Secondary | ICD-10-CM | POA: Diagnosis not present

## 2016-02-22 DIAGNOSIS — D631 Anemia in chronic kidney disease: Secondary | ICD-10-CM | POA: Diagnosis not present

## 2016-02-22 DIAGNOSIS — N2581 Secondary hyperparathyroidism of renal origin: Secondary | ICD-10-CM | POA: Diagnosis not present

## 2016-02-22 DIAGNOSIS — N186 End stage renal disease: Secondary | ICD-10-CM | POA: Diagnosis not present

## 2016-02-24 DIAGNOSIS — N2581 Secondary hyperparathyroidism of renal origin: Secondary | ICD-10-CM | POA: Diagnosis not present

## 2016-02-24 DIAGNOSIS — D631 Anemia in chronic kidney disease: Secondary | ICD-10-CM | POA: Diagnosis not present

## 2016-02-24 DIAGNOSIS — N186 End stage renal disease: Secondary | ICD-10-CM | POA: Diagnosis not present

## 2016-02-27 DIAGNOSIS — D631 Anemia in chronic kidney disease: Secondary | ICD-10-CM | POA: Diagnosis not present

## 2016-02-27 DIAGNOSIS — N186 End stage renal disease: Secondary | ICD-10-CM | POA: Diagnosis not present

## 2016-02-27 DIAGNOSIS — N2581 Secondary hyperparathyroidism of renal origin: Secondary | ICD-10-CM | POA: Diagnosis not present

## 2016-02-29 DIAGNOSIS — N2581 Secondary hyperparathyroidism of renal origin: Secondary | ICD-10-CM | POA: Diagnosis not present

## 2016-02-29 DIAGNOSIS — D631 Anemia in chronic kidney disease: Secondary | ICD-10-CM | POA: Diagnosis not present

## 2016-02-29 DIAGNOSIS — N186 End stage renal disease: Secondary | ICD-10-CM | POA: Diagnosis not present

## 2016-03-02 DIAGNOSIS — N2581 Secondary hyperparathyroidism of renal origin: Secondary | ICD-10-CM | POA: Diagnosis not present

## 2016-03-02 DIAGNOSIS — N186 End stage renal disease: Secondary | ICD-10-CM | POA: Diagnosis not present

## 2016-03-02 DIAGNOSIS — D631 Anemia in chronic kidney disease: Secondary | ICD-10-CM | POA: Diagnosis not present

## 2016-03-05 DIAGNOSIS — N2581 Secondary hyperparathyroidism of renal origin: Secondary | ICD-10-CM | POA: Diagnosis not present

## 2016-03-05 DIAGNOSIS — N186 End stage renal disease: Secondary | ICD-10-CM | POA: Diagnosis not present

## 2016-03-05 DIAGNOSIS — D631 Anemia in chronic kidney disease: Secondary | ICD-10-CM | POA: Diagnosis not present

## 2016-03-06 DIAGNOSIS — H35383 Toxic maculopathy, bilateral: Secondary | ICD-10-CM | POA: Diagnosis not present

## 2016-03-07 DIAGNOSIS — N2581 Secondary hyperparathyroidism of renal origin: Secondary | ICD-10-CM | POA: Diagnosis not present

## 2016-03-07 DIAGNOSIS — D631 Anemia in chronic kidney disease: Secondary | ICD-10-CM | POA: Diagnosis not present

## 2016-03-07 DIAGNOSIS — N186 End stage renal disease: Secondary | ICD-10-CM | POA: Diagnosis not present

## 2016-03-09 DIAGNOSIS — N186 End stage renal disease: Secondary | ICD-10-CM | POA: Diagnosis not present

## 2016-03-09 DIAGNOSIS — N2581 Secondary hyperparathyroidism of renal origin: Secondary | ICD-10-CM | POA: Diagnosis not present

## 2016-03-09 DIAGNOSIS — D631 Anemia in chronic kidney disease: Secondary | ICD-10-CM | POA: Diagnosis not present

## 2016-03-12 DIAGNOSIS — N2581 Secondary hyperparathyroidism of renal origin: Secondary | ICD-10-CM | POA: Diagnosis not present

## 2016-03-12 DIAGNOSIS — N186 End stage renal disease: Secondary | ICD-10-CM | POA: Diagnosis not present

## 2016-03-12 DIAGNOSIS — D631 Anemia in chronic kidney disease: Secondary | ICD-10-CM | POA: Diagnosis not present

## 2016-03-14 DIAGNOSIS — N2581 Secondary hyperparathyroidism of renal origin: Secondary | ICD-10-CM | POA: Diagnosis not present

## 2016-03-14 DIAGNOSIS — N186 End stage renal disease: Secondary | ICD-10-CM | POA: Diagnosis not present

## 2016-03-14 DIAGNOSIS — D631 Anemia in chronic kidney disease: Secondary | ICD-10-CM | POA: Diagnosis not present

## 2016-03-16 DIAGNOSIS — N186 End stage renal disease: Secondary | ICD-10-CM | POA: Diagnosis not present

## 2016-03-16 DIAGNOSIS — D631 Anemia in chronic kidney disease: Secondary | ICD-10-CM | POA: Diagnosis not present

## 2016-03-16 DIAGNOSIS — N2581 Secondary hyperparathyroidism of renal origin: Secondary | ICD-10-CM | POA: Diagnosis not present

## 2016-03-17 ENCOUNTER — Encounter (HOSPITAL_COMMUNITY)
Admission: EM | Disposition: A | Discharge status: Home / Self Care | Payer: Self-pay | Source: Home / Self Care | Attending: Family Medicine

## 2016-03-17 ENCOUNTER — Inpatient Hospital Stay (HOSPITAL_COMMUNITY)
Admission: EM | Admit: 2016-03-17 | Discharge: 2016-03-19 | DRG: 383 | Disposition: A | Discharge status: Home / Self Care | Payer: Medicare Other | Attending: Family Medicine | Admitting: Family Medicine

## 2016-03-17 ENCOUNTER — Emergency Department (HOSPITAL_COMMUNITY): Payer: Medicare Other

## 2016-03-17 ENCOUNTER — Encounter (HOSPITAL_COMMUNITY): Payer: Self-pay | Admitting: Emergency Medicine

## 2016-03-17 DIAGNOSIS — K269 Duodenal ulcer, unspecified as acute or chronic, without hemorrhage or perforation: Principal | ICD-10-CM | POA: Diagnosis present

## 2016-03-17 DIAGNOSIS — Z8249 Family history of ischemic heart disease and other diseases of the circulatory system: Secondary | ICD-10-CM

## 2016-03-17 DIAGNOSIS — T380X5A Adverse effect of glucocorticoids and synthetic analogues, initial encounter: Secondary | ICD-10-CM | POA: Diagnosis present

## 2016-03-17 DIAGNOSIS — Z79899 Other long term (current) drug therapy: Secondary | ICD-10-CM | POA: Diagnosis not present

## 2016-03-17 DIAGNOSIS — D649 Anemia, unspecified: Secondary | ICD-10-CM | POA: Diagnosis present

## 2016-03-17 DIAGNOSIS — Z6821 Body mass index (BMI) 21.0-21.9, adult: Secondary | ICD-10-CM

## 2016-03-17 DIAGNOSIS — E875 Hyperkalemia: Secondary | ICD-10-CM | POA: Diagnosis present

## 2016-03-17 DIAGNOSIS — D500 Iron deficiency anemia secondary to blood loss (chronic): Secondary | ICD-10-CM | POA: Diagnosis not present

## 2016-03-17 DIAGNOSIS — K922 Gastrointestinal hemorrhage, unspecified: Secondary | ICD-10-CM | POA: Diagnosis not present

## 2016-03-17 DIAGNOSIS — N2581 Secondary hyperparathyroidism of renal origin: Secondary | ICD-10-CM | POA: Diagnosis present

## 2016-03-17 DIAGNOSIS — M24672 Ankylosis, left ankle: Secondary | ICD-10-CM | POA: Diagnosis present

## 2016-03-17 DIAGNOSIS — E785 Hyperlipidemia, unspecified: Secondary | ICD-10-CM | POA: Diagnosis present

## 2016-03-17 DIAGNOSIS — D641 Secondary sideroblastic anemia due to disease: Secondary | ICD-10-CM | POA: Diagnosis not present

## 2016-03-17 DIAGNOSIS — D62 Acute posthemorrhagic anemia: Secondary | ICD-10-CM | POA: Diagnosis present

## 2016-03-17 DIAGNOSIS — E8770 Fluid overload, unspecified: Secondary | ICD-10-CM | POA: Diagnosis not present

## 2016-03-17 DIAGNOSIS — K264 Chronic or unspecified duodenal ulcer with hemorrhage: Secondary | ICD-10-CM | POA: Diagnosis not present

## 2016-03-17 DIAGNOSIS — N186 End stage renal disease: Secondary | ICD-10-CM | POA: Diagnosis present

## 2016-03-17 DIAGNOSIS — Z7952 Long term (current) use of systemic steroids: Secondary | ICD-10-CM | POA: Diagnosis not present

## 2016-03-17 DIAGNOSIS — K449 Diaphragmatic hernia without obstruction or gangrene: Secondary | ICD-10-CM | POA: Diagnosis present

## 2016-03-17 DIAGNOSIS — I10 Essential (primary) hypertension: Secondary | ICD-10-CM | POA: Diagnosis present

## 2016-03-17 DIAGNOSIS — E860 Dehydration: Secondary | ICD-10-CM | POA: Diagnosis present

## 2016-03-17 DIAGNOSIS — M329 Systemic lupus erythematosus, unspecified: Secondary | ICD-10-CM | POA: Diagnosis present

## 2016-03-17 DIAGNOSIS — N189 Chronic kidney disease, unspecified: Secondary | ICD-10-CM

## 2016-03-17 DIAGNOSIS — D631 Anemia in chronic kidney disease: Secondary | ICD-10-CM | POA: Diagnosis present

## 2016-03-17 DIAGNOSIS — R42 Dizziness and giddiness: Secondary | ICD-10-CM | POA: Diagnosis not present

## 2016-03-17 DIAGNOSIS — D696 Thrombocytopenia, unspecified: Secondary | ICD-10-CM | POA: Diagnosis present

## 2016-03-17 DIAGNOSIS — M3210 Systemic lupus erythematosus, organ or system involvement unspecified: Secondary | ICD-10-CM | POA: Diagnosis not present

## 2016-03-17 DIAGNOSIS — I120 Hypertensive chronic kidney disease with stage 5 chronic kidney disease or end stage renal disease: Secondary | ICD-10-CM | POA: Diagnosis present

## 2016-03-17 DIAGNOSIS — Z992 Dependence on renal dialysis: Secondary | ICD-10-CM | POA: Diagnosis not present

## 2016-03-17 DIAGNOSIS — E8889 Other specified metabolic disorders: Secondary | ICD-10-CM | POA: Diagnosis present

## 2016-03-17 DIAGNOSIS — T39315A Adverse effect of propionic acid derivatives, initial encounter: Secondary | ICD-10-CM | POA: Diagnosis present

## 2016-03-17 DIAGNOSIS — R531 Weakness: Secondary | ICD-10-CM | POA: Diagnosis not present

## 2016-03-17 DIAGNOSIS — K921 Melena: Secondary | ICD-10-CM | POA: Diagnosis not present

## 2016-03-17 DIAGNOSIS — K222 Esophageal obstruction: Secondary | ICD-10-CM | POA: Diagnosis not present

## 2016-03-17 DIAGNOSIS — F4323 Adjustment disorder with mixed anxiety and depressed mood: Secondary | ICD-10-CM | POA: Diagnosis present

## 2016-03-17 HISTORY — PX: ESOPHAGOGASTRODUODENOSCOPY: SHX5428

## 2016-03-17 LAB — HEPATITIS B SURFACE ANTIGEN: Hepatitis B Surface Ag: NEGATIVE

## 2016-03-17 LAB — RENAL FUNCTION PANEL
Albumin: 2.2 g/dL — ABNORMAL LOW (ref 3.5–5.0)
Anion gap: 16 — ABNORMAL HIGH (ref 5–15)
BUN: 184 mg/dL — ABNORMAL HIGH (ref 6–20)
CO2: 17 mmol/L — ABNORMAL LOW (ref 22–32)
Calcium: 8 mg/dL — ABNORMAL LOW (ref 8.9–10.3)
Chloride: 100 mmol/L — ABNORMAL LOW (ref 101–111)
Creatinine, Ser: 11.66 mg/dL — ABNORMAL HIGH (ref 0.44–1.00)
GFR calc Af Amer: 4 mL/min — ABNORMAL LOW (ref >60)
GFR calc non Af Amer: 4 mL/min — ABNORMAL LOW (ref >60)
Glucose, Bld: 110 mg/dL — ABNORMAL HIGH (ref 65–99)
Phosphorus: 4.5 mg/dL (ref 2.5–4.6)
Potassium: 6.1 mmol/L (ref 3.5–5.1)
Sodium: 133 mmol/L — ABNORMAL LOW (ref 135–145)

## 2016-03-17 LAB — BASIC METABOLIC PANEL
Anion gap: 19 — ABNORMAL HIGH (ref 5–15)
BUN: 188 mg/dL — AB (ref 6–20)
CO2: 17 mmol/L — ABNORMAL LOW (ref 22–32)
CREATININE: 11.8 mg/dL — AB (ref 0.44–1.00)
Calcium: 8.4 mg/dL — ABNORMAL LOW (ref 8.9–10.3)
Chloride: 97 mmol/L — ABNORMAL LOW (ref 101–111)
GFR calc Af Amer: 4 mL/min — ABNORMAL LOW (ref >60)
GFR, EST NON AFRICAN AMERICAN: 4 mL/min — AB (ref >60)
GLUCOSE: 92 mg/dL (ref 65–99)
Potassium: 5.3 mmol/L — ABNORMAL HIGH (ref 3.5–5.1)
SODIUM: 133 mmol/L — AB (ref 135–145)

## 2016-03-17 LAB — VITAMIN B12: Vitamin B-12: 212 pg/mL (ref 180–914)

## 2016-03-17 LAB — CBC WITH DIFFERENTIAL/PLATELET
Basophils Absolute: 0 10*3/uL (ref 0.0–0.1)
Basophils Relative: 0 %
EOS ABS: 0 10*3/uL (ref 0.0–0.7)
Eosinophils Relative: 0 %
HCT: 13 % — ABNORMAL LOW (ref 36.0–46.0)
HEMOGLOBIN: 4.2 g/dL — AB (ref 12.0–15.0)
LYMPHS ABS: 2.8 10*3/uL (ref 0.7–4.0)
Lymphocytes Relative: 14 %
MCH: 30.7 pg (ref 26.0–34.0)
MCHC: 32.3 g/dL (ref 30.0–36.0)
MCV: 94.9 fL (ref 78.0–100.0)
MONOS PCT: 8 %
Monocytes Absolute: 1.6 10*3/uL — ABNORMAL HIGH (ref 0.1–1.0)
NEUTROS ABS: 15.7 10*3/uL — AB (ref 1.7–7.7)
Neutrophils Relative %: 78 %
Platelets: 115 10*3/uL — ABNORMAL LOW (ref 150–400)
RBC: 1.37 MIL/uL — ABNORMAL LOW (ref 3.87–5.11)
RDW: 23.2 % — ABNORMAL HIGH (ref 11.5–15.5)
WBC: 20.1 10*3/uL — ABNORMAL HIGH (ref 4.0–10.5)

## 2016-03-17 LAB — FERRITIN: FERRITIN: 959 ng/mL — AB (ref 11–307)

## 2016-03-17 LAB — FOLATE: Folate: 8.8 ng/mL (ref >5.9)

## 2016-03-17 LAB — PROTIME-INR
INR: 1.2 (ref 0.00–1.49)
PROTHROMBIN TIME: 15.4 s — AB (ref 11.6–15.2)

## 2016-03-17 LAB — IRON AND TIBC
Iron: 130 ug/dL (ref 28–170)
SATURATION RATIOS: 73 % — AB (ref 10.4–31.8)
TIBC: 178 ug/dL — AB (ref 250–450)
UIBC: 48 ug/dL

## 2016-03-17 LAB — CBC
HCT: 20.6 % — ABNORMAL LOW (ref 36.0–46.0)
Hemoglobin: 6.8 g/dL — CL (ref 12.0–15.0)
MCH: 28.9 pg (ref 26.0–34.0)
MCHC: 33 g/dL (ref 30.0–36.0)
MCV: 87.7 fL (ref 78.0–100.0)
Platelets: 75 10*3/uL — ABNORMAL LOW (ref 150–400)
RBC: 2.35 MIL/uL — ABNORMAL LOW (ref 3.87–5.11)
RDW: 19.2 % — ABNORMAL HIGH (ref 11.5–15.5)
WBC: 21.2 10*3/uL — ABNORMAL HIGH (ref 4.0–10.5)

## 2016-03-17 LAB — RETICULOCYTES
RBC.: 1.32 MIL/uL — ABNORMAL LOW (ref 3.87–5.11)
RETIC CT PCT: 9.3 % — AB (ref 0.4–3.1)
Retic Count, Absolute: 122.8 10*3/uL (ref 19.0–186.0)

## 2016-03-17 LAB — POC OCCULT BLOOD, ED: FECAL OCCULT BLD: POSITIVE — AB

## 2016-03-17 LAB — PREPARE RBC (CROSSMATCH)

## 2016-03-17 LAB — MRSA PCR SCREENING: MRSA BY PCR: NEGATIVE

## 2016-03-17 SURGERY — EGD (ESOPHAGOGASTRODUODENOSCOPY)
Anesthesia: Moderate Sedation

## 2016-03-17 MED ORDER — TRAZODONE HCL 50 MG PO TABS
25.0000 mg | ORAL_TABLET | Freq: Every day | ORAL | Status: DC
  Administered 2016-03-17 – 2016-03-18 (×2): 25 mg via ORAL
  Filled 2016-03-17 (×2): qty 1

## 2016-03-17 MED ORDER — SODIUM CHLORIDE 0.9 % IV SOLN: Freq: Once | INTRAVENOUS | Status: DC

## 2016-03-17 MED ORDER — ONDANSETRON HCL 4 MG/2ML IJ SOLN: 4.0000 mg | Freq: Four times a day (QID) | INTRAMUSCULAR | Status: DC | PRN

## 2016-03-17 MED ORDER — SODIUM CHLORIDE 0.9 % IV SOLN
8.0000 mg/h | INTRAVENOUS | Status: DC
  Administered 2016-03-17 – 2016-03-19 (×5): 8 mg/h via INTRAVENOUS
  Filled 2016-03-17 (×12): qty 80

## 2016-03-17 MED ORDER — LIDOCAINE HCL (PF) 1 % IJ SOLN: 5.0000 mL | INTRAMUSCULAR | Status: DC | PRN

## 2016-03-17 MED ORDER — SODIUM CHLORIDE 0.9% FLUSH
3.0000 mL | Freq: Two times a day (BID) | INTRAVENOUS | Status: DC
  Administered 2016-03-17 – 2016-03-19 (×4): 3 mL via INTRAVENOUS

## 2016-03-17 MED ORDER — PENTAFLUOROPROP-TETRAFLUOROETH EX AERO: 1.0000 "application " | INHALATION_SPRAY | CUTANEOUS | Status: DC | PRN

## 2016-03-17 MED ORDER — ACETAMINOPHEN 325 MG PO TABS: 650.0000 mg | ORAL_TABLET | Freq: Four times a day (QID) | ORAL | Status: DC | PRN

## 2016-03-17 MED ORDER — FENTANYL CITRATE (PF) 100 MCG/2ML IJ SOLN
INTRAMUSCULAR | Status: AC
  Filled 2016-03-17: qty 2

## 2016-03-17 MED ORDER — ACETAMINOPHEN 650 MG RE SUPP
650.0000 mg | Freq: Four times a day (QID) | RECTAL | Status: DC | PRN
Start: 2016-03-17 — End: 2016-03-19

## 2016-03-17 MED ORDER — DIPHENHYDRAMINE HCL 50 MG/ML IJ SOLN
INTRAMUSCULAR | Status: DC | PRN
  Administered 2016-03-17 (×2): 25 mg via INTRAVENOUS

## 2016-03-17 MED ORDER — DOXERCALCIFEROL 4 MCG/2ML IV SOLN: 1.0000 ug | Freq: Once | INTRAVENOUS | Status: AC

## 2016-03-17 MED ORDER — ONDANSETRON HCL 4 MG PO TABS: 4.0000 mg | ORAL_TABLET | Freq: Four times a day (QID) | ORAL | Status: DC | PRN

## 2016-03-17 MED ORDER — CALCIUM ACETATE (PHOS BINDER) 667 MG PO CAPS: 1334.0000 mg | ORAL_CAPSULE | Freq: Three times a day (TID) | ORAL | Status: DC

## 2016-03-17 MED ORDER — DOXERCALCIFEROL 4 MCG/2ML IV SOLN
INTRAVENOUS | Status: AC
  Administered 2016-03-17: 4 ug
  Filled 2016-03-17: qty 2

## 2016-03-17 MED ORDER — HEPARIN SODIUM (PORCINE) 1000 UNIT/ML DIALYSIS: 1000.0000 [IU] | INTRAMUSCULAR | Status: DC | PRN

## 2016-03-17 MED ORDER — SODIUM CHLORIDE 0.9 % IV SOLN
10.0000 mL/h | Freq: Once | INTRAVENOUS | Status: AC
  Administered 2016-03-17: 10 mL/h via INTRAVENOUS

## 2016-03-17 MED ORDER — PANTOPRAZOLE SODIUM 40 MG IV SOLR
40.0000 mg | Freq: Two times a day (BID) | INTRAVENOUS | Status: DC
Start: 2016-03-20 — End: 2016-03-19

## 2016-03-17 MED ORDER — FENTANYL CITRATE (PF) 100 MCG/2ML IJ SOLN
INTRAMUSCULAR | Status: DC | PRN
  Administered 2016-03-17 (×4): 25 ug via INTRAVENOUS

## 2016-03-17 MED ORDER — BUTAMBEN-TETRACAINE-BENZOCAINE 2-2-14 % EX AERO
INHALATION_SPRAY | CUTANEOUS | Status: DC | PRN
  Administered 2016-03-17: 2 via TOPICAL

## 2016-03-17 MED ORDER — LIDOCAINE-PRILOCAINE 2.5-2.5 % EX CREA: 1.0000 "application " | TOPICAL_CREAM | CUTANEOUS | Status: DC | PRN

## 2016-03-17 MED ORDER — DIPHENHYDRAMINE HCL 50 MG/ML IJ SOLN
INTRAMUSCULAR | Status: AC
  Filled 2016-03-17: qty 1

## 2016-03-17 MED ORDER — ALTEPLASE 2 MG IJ SOLR: 2.0000 mg | Freq: Once | INTRAMUSCULAR | Status: DC | PRN

## 2016-03-17 MED ORDER — MIDAZOLAM HCL 5 MG/ML IJ SOLN
INTRAMUSCULAR | Status: AC
  Filled 2016-03-17: qty 2

## 2016-03-17 MED ORDER — SODIUM CHLORIDE 0.9 % IV SOLN: 100.0000 mL | INTRAVENOUS | Status: DC | PRN

## 2016-03-17 MED ORDER — SODIUM CHLORIDE 0.9 % IV SOLN
80.0000 mg | Freq: Once | INTRAVENOUS | Status: AC
  Administered 2016-03-17: 80 mg via INTRAVENOUS
  Filled 2016-03-17: qty 80

## 2016-03-17 MED ORDER — MIDAZOLAM HCL 10 MG/2ML IJ SOLN
INTRAMUSCULAR | Status: DC | PRN
  Administered 2016-03-17 (×5): 2 mg via INTRAVENOUS

## 2016-03-17 MED ORDER — DOXERCALCIFEROL 4 MCG/2ML IV SOLN
1.0000 ug | INTRAVENOUS | Status: DC
  Administered 2016-03-19: 1 ug via INTRAVENOUS
  Filled 2016-03-17: qty 2

## 2016-03-17 MED ORDER — METHYLPREDNISOLONE SODIUM SUCC 40 MG IJ SOLR
20.0000 mg | Freq: Every day | INTRAMUSCULAR | Status: DC
  Administered 2016-03-17 – 2016-03-19 (×3): 20 mg via INTRAVENOUS
  Filled 2016-03-17 (×3): qty 1

## 2016-03-17 NOTE — ED Notes (Signed)
Upon standing for orthostatic vital signs pt became very shaky and dizzy. Pt eased back into bed, given a cool rag. Symptoms subsiding at this time.

## 2016-03-17 NOTE — Op Note (Signed)
[facility] Patient Name: [name] Procedure Date : 03/17/2016 MRN: [id number] Attending MD: Docia Chuck. Henrene Pastor , MD Date of Birth: [birth year] CSN: [id number] Age: 40 Admit Type: Inpatient Procedure:                Upper GI endoscopy, With biopsy Indications:              Melena Providers:                Docia Chuck. Henrene Pastor, MD, Dortha Schwalbe, RN, Corliss Parish, Technician Referring MD:             [facility] Medicines:                Fentanyl 100 micrograms IV, Midazolam 10 mg IV,                            Diphenhydramine 50 mg IV Complications:            No immediate complications. Estimated Blood Loss:     Estimated blood loss: none. Procedure:                Pre-Anesthesia Assessment:                           - Prior to the procedure, a History and Physical                            was performed, and patient medications and                            allergies were reviewed. The patient's tolerance of                            previous anesthesia was also reviewed. The risks                            and benefits of the procedure and the sedation                            options and risks were discussed with the patient.                            All questions were answered, and informed consent                            was obtained. Prior Anticoagulants: The patient has                            taken no previous anticoagulant or antiplatelet                            agents. ASA Grade Assessment: III - A patient with  severe systemic disease. After reviewing the risks                            and benefits, the patient was deemed in                            satisfactory condition to undergo the procedure.                           After obtaining informed consent, the endoscope was                            passed under direct vision. Throughout the   procedure, the patient's blood pressure, pulse, and                            oxygen saturations were monitored continuously. The                            [id number]) scope was introduced through the                            mouth, and advanced to the second part of duodenum.                            The upper GI endoscopy was accomplished without                            difficulty. The patient tolerated the procedure                            well. Findings:      One benign-appearing,Ring like intrinsic stenosis was found At the GE       junction. This measured 1.5 cm (inner diameter).      The exam of the esophagus was otherwise normal.      The entire examined stomach was normal.      Three non-bleeding cratered duodenal ulcers with no stigmata of bleeding       were found in the duodenal bulb. The largest lesion was 6 mm in largest       dimension. Biopsies were taken with a cold forceps for Helicobacter       pylori testing using CLOtest.      The exam of the duodenum was otherwise normal.      A small hiatal hernia was present, On retroflexed view. Impression:               - 1. Recent GI bleed secondary to duodenal ulcers.                            No high risk stigmata. No active bleeding                           - 2. Incidental esophageal stricture                           -  3. otherwise unremarkable exam Moderate Sedation:      Moderate (conscious) sedation was administered by the endoscopy nurse       and supervised by the endoscopist. The following parameters were       monitored: oxygen saturation, heart rate, blood pressure, and response       to care. Recommendation:           1. Pantoprazole 40 mg twice daily for 4 weeks then                            40 mg once daily                           2. Avoid NSAIDs                           3. Treat for Helicobacter pylori if CLO biopsy                            positive                           4.  Advance diet                           5. Anticipate discharge tomorrow if stable No                            routine GI follow-up required. Please call for                            questions. Will sign off. Procedure Code(s):        --- Professional ---                           [phone], Esophagogastroduodenoscopy, flexible,                            transoral; with biopsy, single or multiple Diagnosis Code(s):        --- Professional ---                           K22.2, Esophageal obstruction                           K26.9, Duodenal ulcer, unspecified as acute or                            chronic, without hemorrhage or perforation                           K92.1, Melena (includes Hematochezia) CPT copyright 2016 American Medical Association. All rights reserved. The codes documented in this report are preliminary and upon coder review may  be revised to meet current compliance requirements. Docia Chuck. Henrene Pastor, MD 03/17/2016 1:44:05 PM This report has been signed electronically. Number of Addenda: 0

## 2016-03-17 NOTE — Plan of Care (Signed)
Problem: Bowel/Gastric: Goal: Will show no signs and symptoms of gastrointestinal bleeding EGD completed today  Problem: Fluid Volume: Goal: Will show no signs and symptoms of excessive bleeding Pt received multiple blood transfusions today

## 2016-03-17 NOTE — ED Notes (Signed)
Pt has swelling to the left lower extremity. Per pt, swelling is from lupus and is normal for her.

## 2016-03-17 NOTE — Progress Notes (Signed)
Electrolyte panel has returned. Potassium 5.3, BUN 188 and creatinine 11.8. Elevated BUN consistent with upper GI bleed. In addition patient admitted to gastroenterology that she took Aleve PM nightly for the past 3 months to help with sleeping. Patient likely has NSAID induced ulcer superimposed upon possible prednisone gastritis (although she has been on prednisone for years without any GI complications)  Erin Hearing, ANP

## 2016-03-17 NOTE — ED Notes (Signed)
Critical value of Hemoglobin 4.2 reported to Dr. Kathrynn Humble.

## 2016-03-17 NOTE — ED Notes (Signed)
Pt reporting severe headache and nausea. Pt has been in trendelenburg d/t MD starting IV. Pt repositioned with head of bed at 30 degrees. Pt reports headache and nausea subsiding. Vital signs stable. Admitting MD aware, blood continued at this time.

## 2016-03-17 NOTE — ED Notes (Signed)
This NS picked up blood from blood bank. Handed blood to [place], [place] @ [phone].

## 2016-03-17 NOTE — Consult Note (Signed)
[facility] Renal Consultation Note    Indication for Consultation:  Management of ESRD/hemodialysis; anemia, hypertension/volume and secondary hyperparathyroidism PCP: Hoyt Koch, MD   HPI: [name] is a 40 y.o. 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  She reports onset of dark stools Friday (yesterday- not a week ago) that became blacker this am. She is prone to constipation from binders and tells me she didn't have a stool on Thursday  She was too weak and dizzy to attend dialysis and ultimately came to the ED this am. She has not had any more stools since arrival. Hemocult was Heme +. There was no associated N, V, abdominal pain, fever or chills.  She beccomes SOB with any exertion. She was doing well 4/26 when seen at dialysis by the rounding provider.  She requires predisone for lupus management.  She started taking Alleve two months ago to help her sleep.  She denied pain at night, she just said the alleve helps her sleep.  She still makes small amounts of urine. Last outpt Hgb was trending down at 9.1 4/26, 9.2 4/19, 10.4 4/5 and 11.2 3/22 with iron studies from 4/26: Fe of 68. tsat of 36% ferritin 1502. Her last dialysis was Wednesday. There are no chemistries available from admission.  Past Medical History  Diagnosis Date  . Hypertension   . Lupus ([place])   . Hyperlipemia   . Anemia   . Secondary hyperparathyroidism (of renal origin)   . 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 [id number]    Past Surgical History  Procedure Laterality Date  . Av fistula placement  Left   . Incision and drainage abscess      gluteal  . Tibiocalcaneal fusion  left    . Hematoma evacuation Left 10/09/2013    Procedure: EVACUATION HEMATOMA;  Surgeon: Angelia Mould, MD;  Location: [place];  Service: Vascular;  Laterality: Left;  . Insertion of dialysis catheter N/A 10/09/2013    Procedure: INSERTION OF DIALYSIS CATHETER; ULTRASOUND GUIDED;  Surgeon: Angelia Mould, MD;  Location: [place];  Service: Vascular;  Laterality: N/A;  . Av fistula placement Right 01/26/2014    Procedure: ARTERIOVENOUS (AV) FISTULA CREATION; ultrasound guided;  Surgeon: Conrad [place], MD;  Location: [place];  Service: Vascular;  Laterality: Right;  . Shuntogram N/A 01/07/2014    Procedure: fistulogram with possibe venoplasty left upper arm avg;  Surgeon: Angelia Mould, MD;  Location: [facility] CATH LAB;  Service: Cardiovascular;  Laterality: N/A;   Family History  Problem Relation Age of Onset  . Asthma Sister   . Hypertension Mother   . Heart attack Mother    Social History:  reports that she has never smoked. She has never used smokeless tobacco. She reports that she does not drink alcohol or use illicit drugs. Allergies  Allergen Reactions  . Amlodipine Hives    Swelling  . Sulfa Antibiotics Hives and Itching  . Vancomycin Hives and Itching   Prior to Admission medications   Medication Sig Start Date End Date Taking? Authorizing Provider  acetaminophen (TYLENOL) 500  MG tablet Take 500 mg by mouth as needed for headache.    Yes Historical Provider, MD  calcium acetate (PHOSLO) 667 MG capsule Take 1,334 mg by mouth 3 (three) times daily with meals.    Yes Historical Provider, MD  furosemide (LASIX) 80 MG tablet Take 80 mg by mouth 2 (two) times daily.    Yes Historical Provider, MD  predniSONE (DELTASONE) 10 MG tablet Take 10 mg by mouth daily with breakfast.   Yes Historical Provider, MD  traZODone (DESYREL) 50 MG tablet Take 25 mg by mouth at bedtime.    Yes Historical Provider,  MD   Current Facility-Administered Medications  Medication Dose Route Frequency Provider Last Rate Last Dose  . acetaminophen (TYLENOL) tablet 650 mg  650 mg Oral Q6H PRN Samella Parr, NP       Or  . acetaminophen (TYLENOL) suppository 650 mg  650 mg Rectal Q6H PRN Samella Parr, NP      . calcium acetate (PHOSLO) capsule 1,334 mg  1,334 mg Oral TID WC Samella Parr, NP      . methylPREDNISolone sodium succinate (SOLU-MEDROL) 40 mg/mL injection 20 mg  20 mg Intravenous Daily Samella Parr, NP      . ondansetron [facility]) tablet 4 mg  4 mg Oral Q6H PRN Samella Parr, NP       Or  . ondansetron [facility]) injection 4 mg  4 mg Intravenous Q6H PRN Samella Parr, NP      . pantoprazole (PROTONIX) 80 mg in sodium chloride 0.9 % 250 mL (0.32 mg/mL) infusion  8 mg/hr Intravenous Continuous Varney Biles, MD 25 mL/hr at 03/17/16 0857 8 mg/hr at 03/17/16 0857  . [START ON 03/20/2016] pantoprazole (PROTONIX) injection 40 mg  40 mg Intravenous Q12H Ankit Nanavati, MD      . sodium chloride flush (NS) 0.9 % injection 3 mL  3 mL Intravenous Q12H Samella Parr, NP      . traZODone (DESYREL) tablet 25 mg  25 mg Oral QHS Samella Parr, NP      CBC:  Recent Labs Lab 03/17/16 0638  WBC 20.1*  NEUTROABS 15.7*  HGB 4.2*  HCT 13.0*  MCV 94.9  PLT 115*   Iron Studies:  Recent Labs  03/17/16 0743  IRON 130  TIBC 178*  FERRITIN 959*   Studies/Results: Dg Chest Port 1 View  03/17/2016  CLINICAL DATA:  Dizziness. EXAM: PORTABLE CHEST 1 VIEW COMPARISON:  01/26/2014 FINDINGS: Chronic scarring at the bases. There is no edema, consolidation, effusion, or pneumothorax. Normal heart size and mediastinal contours. Left upper extremity stenting in this patient with end-stage renal disease. IMPRESSION: 1. No active disease. 2. Mild basilar scarring, stable from 2015. Electronically Signed   By: Monte Fantasia M.D.   On: 03/17/2016 06:57    ROS: As per HPI otherwise negative.  Physical  Exam: Filed Vitals:   03/17/16 0827 03/17/16 0845 03/17/16 0916 03/17/16 1000  BP: 118/68 105/65 115/67 101/67  Pulse: 94     Temp: 98.1 F (36.7 C)  97.5 F (36.4 C)   TempSrc: Oral  Oral   Resp: 17 17 14 17   Height:   5\' 4"  (1.626 m)   Weight:   57.2 kg (126 lb 1.7 oz)   SpO2: 95% 94%       General: Well developed, well nourished, in no acute distress supine in bed receiving transfusion Head: Normocephalic, atraumatic, sclera non-icteric, mucus membranes are moist Neck: Supple. JVD not  elevated. Lungs: Clear bilaterally to auscultation without wheezes, rales, or rhonchi. Breathing is unlabored. Heart: tachy reg rate low 100s Abdomen: Soft, non-tender, non-distended with normoactive bowel sounds.  M-S:  Strength and tone appear normal for age. Lower extremities:without edema or ischemic changes, no open wounds/SCDs in place Neuro: Alert and oriented X 3. Moves all extremities spontaneously. Psych:  Responds to questions appropriately with a normal affect. Dialysis Access: left upper AVGG + bruit  Dialysis Orders: MWF AF 3.5 hours 400/A 1.5 2K 2.25 Ca heparin 1800 left upper AVGG Hectorol 1, mircera 150 q 2 - last received 100 on 4/17. Compliant with HD treatments - get to or near EDW BP controlled Recent labs: see HPI- iPTH 251 Ca/P ok  Assessment/Plan: 1.  Acute GIB - hgb per outpt HD records had been stable 4/26 at 9.1 - now with 5 mg drop in 3 days, though somewhat drifting down; evaluation as per GI; plan for EGD today 2. ABLA anemia - plan transfusion 4 units PRBC (has received one unit so far) - continue on HD today; recent Fe studies per HPI - before acute GIB - continue ESA - may not need higher dose (had just been increased from 100 to 150).Timing of Fe studies today look like they were drawn before transfusion but because Fe is signifcantly high than outpt Fe on 4/26, not sure this is accurate. Next ESA dose due 5/1 3.  ESRD -  MWF -plan HD today, last dialyzed Wed - no  heparin; schedule around GI procedure; still no chemistries- lab wouldn't draw because of blood infusing in right hand - lab may have a same from earlier this am so they can use to check chemistries; need to make sure K is ok before she goes for EGD. Higher risk for elevated K due to GIB, missed HD and blood transfusion. 4.  Hypertension/volume  - BP lower than usual due to low hgb - CXR neg for volume 5.  Metabolic bone disease -  Continue hectorol- hold binders while npo 6.  Nutrition - npo for now 7. SLE - IV steroids  Mary Jacobson, PA-C [facility] Beeper [phone] 03/17/2016, 10:44 AM     I have seen and examined this patient and agree with plan as outlined by Amalia Hailey, PA-C.  For EGD and HD today to help with hyperkalemia. Broadus John A Anavey Coombes,MD 03/18/2016 7:51 AM

## 2016-03-17 NOTE — Consult Note (Signed)
Consultation  Referring Provider:  [facility] Primary Care Physician:  Hoyt Koch, MD Primary Gastroenterologist:  None/unassigned.  Reason for Consultation:   Melena /hgb 4.2  HPI: [name] is a 40 y.o. female who is admitted this morning through the emergency room after presenting with aggressive weakness and dizziness. Patient has history of lupus, is on chronic steroids and chronic kidney disease which is been associated with anemia of chronic disease and hyperparathyroidism. She is a dialysis patient. She relates onset about a week ago with black stools and has been having black stools on a daily basis since and had 5 bowel movements yesterday. She has had some nausea but no vomiting She missed dialysis yesterday because she felt too weak and dizzy to drive. Patient is not on a PPI or H2 blocker, has no prior history of GI bleeding. She has been taking 1 Aleve every night at bedtime over the past 3 months to help her sleep. She denies any heartburn indigestion or dysphagia. No abdominal pain. She had been having several bowel movements per day over the past week which is unusual for her. Apple stools have been black no obvious blood Labs in the ER revealed hemoglobin of 4.2, WBC of 20,000 and platelets 115,000. Iron studies B12 and folate are pending and she is being transfused. Patient has been stable, normotensive on admit.   Past Medical History  Diagnosis Date  . Hypertension   . Lupus ([place])   . Hyperlipemia   . Anemia   . Secondary hyperparathyroidism (of renal origin)   . ESRD (end stage renal disease) on dialysis ([place])     Bx 2006 mixed mebranous and diffuse and necrotizing and sclerosing nephritis  . 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 [id number])     Past Surgical History  Procedure Laterality Date  . Av fistula placement Left   . Incision and drainage abscess      gluteal  .  Tibiocalcaneal fusion  left    . Hematoma evacuation Left 10/09/2013    Procedure: EVACUATION HEMATOMA;  Surgeon: Angelia Mould, MD;  Location: [place];  Service: Vascular;  Laterality: Left;  . Insertion of dialysis catheter N/A 10/09/2013    Procedure: INSERTION OF DIALYSIS CATHETER; ULTRASOUND GUIDED;  Surgeon: Angelia Mould, MD;  Location: [place];  Service: Vascular;  Laterality: N/A;  . Av fistula placement Right 01/26/2014    Procedure: ARTERIOVENOUS (AV) FISTULA CREATION; ultrasound guided;  Surgeon: Conrad [place], MD;  Location: [place];  Service: Vascular;  Laterality: Right;  . Shuntogram N/A 01/07/2014    Procedure: fistulogram with possibe venoplasty left upper arm avg;  Surgeon: Angelia Mould, MD;  Location: [facility] CATH LAB;  Service: Cardiovascular;  Laterality: N/A;    Prior to Admission medications   Medication Sig Start Date End Date Taking? Authorizing Provider  acetaminophen (TYLENOL) 500 MG tablet Take 500 mg by mouth as needed for headache.    Yes Historical Provider, MD  calcium acetate (PHOSLO) 667 MG capsule Take 1,334 mg by mouth 3 (three) times daily with meals.    Yes Historical Provider, MD  furosemide (LASIX) 80 MG tablet Take 80 mg by mouth 2 (two) times daily.    Yes Historical Provider, MD  predniSONE (DELTASONE) 10 MG tablet Take 10 mg by mouth daily with breakfast.   Yes Historical Provider, MD  traZODone (DESYREL) 50 MG tablet Take 25  mg by mouth at bedtime.    Yes Historical Provider, MD    Current Facility-Administered Medications  Medication Dose Route Frequency Provider Last Rate Last Dose  . acetaminophen (TYLENOL) tablet 650 mg  650 mg Oral Q6H PRN Samella Parr, NP       Or  . acetaminophen (TYLENOL) suppository 650 mg  650 mg Rectal Q6H PRN Samella Parr, NP      . calcium acetate (PHOSLO) capsule 1,334 mg  1,334 mg Oral TID WC Samella Parr, NP      . methylPREDNISolone sodium succinate (SOLU-MEDROL) 40 mg/mL injection 20 mg  20  mg Intravenous Daily Samella Parr, NP      . ondansetron [facility]) tablet 4 mg  4 mg Oral Q6H PRN Samella Parr, NP       Or  . ondansetron [facility]) injection 4 mg  4 mg Intravenous Q6H PRN Samella Parr, NP      . pantoprazole (PROTONIX) 80 mg in sodium chloride 0.9 % 250 mL (0.32 mg/mL) infusion  8 mg/hr Intravenous Continuous Varney Biles, MD 25 mL/hr at 03/17/16 0857 8 mg/hr at 03/17/16 0857  . [START ON 03/20/2016] pantoprazole (PROTONIX) injection 40 mg  40 mg Intravenous Q12H Ankit Nanavati, MD      . sodium chloride flush (NS) 0.9 % injection 3 mL  3 mL Intravenous Q12H Samella Parr, NP      . traZODone (DESYREL) tablet 25 mg  25 mg Oral QHS Samella Parr, NP        Allergies as of 03/17/2016 - Review Complete 03/17/2016  Allergen Reaction Noted  . Amlodipine Hives 10/09/2013  . Sulfa antibiotics Hives and Itching 10/09/2013  . Vancomycin Hives and Itching 10/09/2013    Family History  Problem Relation Age of Onset  . Asthma Sister   . Hypertension Mother   . Heart attack Mother     Social History   Social History  . Marital Status: Single    Spouse Name: N/A  . Number of Children: N/A  . Years of Education: N/A   Occupational History  . Not on file.   Social History Main Topics  . Smoking status: Never Smoker   . Smokeless tobacco: Never Used  . Alcohol Use: No  . Drug Use: No  . Sexual Activity: Not on file   Other Topics Concern  . Not on file   Social History Narrative    Review of Systems: Pertinent positive and negative review of systems were noted in the above HPI section.  All other review of systems was otherwise negative. Physical Exam: Vital signs in last 24 hours: Temp:  [97.5 F (36.4 C)-98.2 F (36.8 C)] 97.5 F (36.4 C) (04/29 0916) Pulse Rate:  [89-101] 94 (04/29 0827) Resp:  [14-29] 14 (04/29 0916) BP: (103-121)/(58-72) 115/67 mmHg (04/29 0916) SpO2:  [94 %-100 %] 94 % (04/29 0845) Weight:  [126 lb 1.7 oz (57.2 kg)] 126  lb 1.7 oz (57.2 kg) (04/29 [id number])   General:   Alert,  Well-developed, well-nourished,AA female  pleasant and cooperative in NAD Head:  Normocephalic and atraumatic. Eyes:  Sclera clear, no icterus.   Conjunctiva pale  Ears:  Normal auditory acuity. Nose:  No deformity, discharge,  or lesions. Mouth:  No deformity or lesions.   Neck:  Supple; no masses or thyromegaly. Lungs:  Clear throughout to auscultation.   No wheezes, crackles, or rhonchi. Heart:  Regular rate and rhythm; no murmurs, clicks, rubs,  or gallops. Abdomen:  Soft,nontender, BS active,nonpalp mass or hsm.   Rectal:  Deferred  Msk:  Symmetrical without gross deformities. . Pulses:  Normal pulses noted. Extremities:  Without clubbing or edema. Neurologic:  Alert and  oriented x4;  grossly normal neurologically. Skin:  Intact without significant lesions or rashes.. Psych:  Alert and cooperative. Normal mood and affect.  Intake/Output from previous day:   Intake/Output this shift: Total I/O In: 100 [I.V.:100] Out: -   Lab Results:  Recent Labs  03/17/16 0638  WBC 20.1*  HGB 4.2*  HCT 13.0*  PLT 115*   BMET No results for input(s): NA, K, CL, CO2, GLUCOSE, BUN, CREATININE, CALCIUM in the last 72 hours. LFT No results for input(s): PROT, ALBUMIN, AST, ALT, ALKPHOS, BILITOT, BILIDIR, IBILI in the last 72 hours. 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 Henrene Pastor today. Procedure discussed in detail with the patient including risks and benefits and she is agreeable to proceed Plan is for dialysis later today Thank you- we will follow with you   Amy Esterwood  03/17/2016, 10:13 AM   GI ATTENDING  History, laboratories, and ancillary data reviewed. Patient seen and examined. Agree with comprehensive consultation note as outlined above. Patient with advanced renal disease on dialysis presents with upper GI bleeding and marked anemia. Now stable. Suspect NSAID induced ulcer based on history.Plan urgent EGD today. High risk given comorbidities.The nature of the procedure, as well as the risks, benefits, and alternatives were carefully and thoroughly reviewed with the patient. Ample time for discussion and questions allowed. The patient understood, was satisfied, and agreed to proceed.  Docia Chuck. Geri Seminole., M.D. [facility] Division of Gastroenterology

## 2016-03-17 NOTE — Progress Notes (Signed)
Hemodialysis- Repeat labs resulted K=6.1 and Hgb=6.8, Pt currently on 2K bath and is going to receive 2 units prbcs with HD.

## 2016-03-17 NOTE — ED Provider Notes (Addendum)
CSN: [id number]     Arrival date & time 03/17/16  [id number] History   First MD Initiated Contact with Patient 03/17/16 0554     Chief Complaint  Patient presents with  . Weakness     (Consider location/radiation/quality/duration/timing/severity/associated sxs/prior Treatment) HPI Comments: PT comes in with cc of weakness, dizziness. Hx of SLE, ESRD on HD (m/w/f), last session on Wednesday. Pt reports that she started having generalized weakness on F, and therefore missed her HD. Pt get tired easily, and has noticed dizziness and near fainting spells when she gets up and when she is ambulating. She didn't go to HD as she didn't think it was safe for her to drive. Denies any fevers, but does endorse to chills. Pt also reports dark tarry stools for the last couple of days. No hx of GI bleed. No abd pain.   ROS 10 Systems reviewed and are negative for acute change except as noted in the HPI.     Patient is a 40 y.o. female presenting with weakness. The history is provided by the patient.  Weakness    Past Medical History  Diagnosis Date  . Hypertension   . Lupus ([place])   . Hyperlipemia   . Anemia   . Secondary hyperparathyroidism (of renal origin)   . 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 [id number])    Past Surgical History  Procedure Laterality Date  . Av fistula placement Left   . Incision and drainage abscess      gluteal  . Tibiocalcaneal fusion  left    . Hematoma evacuation Left 10/09/2013    Procedure: EVACUATION HEMATOMA;  Surgeon: Angelia Mould, MD;  Location: [place];  Service: Vascular;  Laterality: Left;  . Insertion of dialysis catheter N/A 10/09/2013    Procedure: INSERTION OF DIALYSIS CATHETER; ULTRASOUND GUIDED;  Surgeon: Angelia Mould, MD;  Location: [place];   Service: Vascular;  Laterality: N/A;  . Av fistula placement Right 01/26/2014    Procedure: ARTERIOVENOUS (AV) FISTULA CREATION; ultrasound guided;  Surgeon: Conrad [place], MD;  Location: [place];  Service: Vascular;  Laterality: Right;  . Shuntogram N/A 01/07/2014    Procedure: fistulogram with possibe venoplasty left upper arm avg;  Surgeon: Angelia Mould, MD;  Location: [facility] C[facility] LAB;  Service: Cardiovascular;  Laterality: N/A;   Family History  Problem Relation Age of Onset  . Asthma Sister   . Hypertension Mother   . Heart attack Mother    Social History  Substance Use Topics  . Smoking status: Never Smoker   . Smokeless tobacco: Never Used  . Alcohol Use: No   OB History    No data available     Review of Systems  Neurological: Positive for weakness.      Allergies  Amlodipine; Sulfa antibiotics; and Vancomycin  Home Medications   Prior to Admission medications   Medication Sig Start Date End Date Taking? Authorizing Provider  acetaminophen (TYLENOL) 500 MG tablet Take 500 mg by mouth as needed for headache.    Yes Historical Provider, MD  calcium acetate (PHOSLO) 667 MG capsule Take 1,334 mg by mouth 3 (three) times daily with meals.    Yes Historical Provider, MD  furosemide (LASIX) 80 MG tablet Take 80 mg by mouth 2 (two)  times daily.    Yes Historical Provider, MD  predniSONE (DELTASONE) 10 MG tablet Take 10 mg by mouth daily with breakfast.   Yes Historical Provider, MD  traZODone (DESYREL) 50 MG tablet Take 25 mg by mouth at bedtime.    Yes Historical Provider, MD   BP 118/68 mmHg  Pulse 94  Temp(Src) 98.1 F (36.7 C) (Oral)  Resp 17  SpO2 95%  LMP 01/18/2016 Physical Exam  Constitutional: She is oriented to person, place, and time. She appears well-developed.  HENT:  Head: Normocephalic and atraumatic.  Eyes: Conjunctivae and EOM are normal. Pupils are equal, round, and reactive to light.  Neck: Normal range of motion. Neck supple. No JVD present.   Cardiovascular: Normal rate, regular rhythm and normal heart sounds.   Pulmonary/Chest: Effort normal and breath sounds normal. No respiratory distress. She has no wheezes. She has no rales.  Abdominal: Soft. Bowel sounds are normal. She exhibits no distension. There is no tenderness. There is no rebound and no guarding.  MELENOTIC STOOLS  Neurological: She is alert and oriented to person, place, and time.  Skin: Skin is warm and dry.  Nursing note and vitals reviewed.   ED Course  .Critical Care Performed by: Varney Biles Authorized by: Varney Biles Total critical care time: 45 minutes Critical care time was exclusive of separately billable procedures and treating other patients. Critical care was necessary to treat or prevent imminent or life-threatening deterioration of the following conditions: dehydration and renal failure (symptomatic anemia). Critical care was time spent personally by me on the following activities: blood draw for specimens, discussions with consultants, development of treatment plan with patient or surrogate, examination of patient, obtaining history from patient or surrogate, ordering and performing treatments and interventions, ordering and review of laboratory studies, ordering and review of radiographic studies, pulse oximetry, review of old charts and re-evaluation of patient's condition.    Angiocath insertion Performed by: Varney Biles  Consent: Verbal consent obtained. Risks and benefits: risks, benefits and alternatives were discussed Time out: Immediately prior to procedure a "time out" was called to verify the correct patient, procedure, equipment, support staff and site/side marked as required.  Preparation: Patient was prepped and draped in the usual sterile fashion.  Vein Location: right External jugular  Gauge: 18  Normal blood return and flush without difficulty Patient tolerance: Patient tolerated the procedure well with no  immediate complications.     (including critical care time) Labs Review Labs Reviewed  CBC WITH DIFFERENTIAL/PLATELET - Abnormal; Notable for the following:    WBC 20.1 (*)    RBC 1.37 (*)    Hemoglobin 4.2 (*)    HCT 13.0 (*)    RDW 23.2 (*)    Platelets 115 (*)    Neutro Abs 15.7 (*)    Monocytes Absolute 1.6 (*)    All other components within normal limits  PROTIME-INR - Abnormal; Notable for the following:    Prothrombin Time 15.4 (*)    All other components within normal limits  RETICULOCYTES - Abnormal; Notable for the following:    Retic Ct Pct 9.3 (*)    RBC. 1.32 (*)    All other components within normal limits  POC OCCULT BLOOD, ED - Abnormal; Notable for the following:    Fecal Occult Bld POSITIVE (*)    All other components within normal limits  CULTURE, BLOOD (ROUTINE X 2)  CULTURE, BLOOD (ROUTINE X 2)  VITAMIN B12  FOLATE  IRON AND TIBC  FERRITIN  H PYLORI, IGM, IGG, IGA AB  I-STAT CHEM 8, ED  TYPE AND SCREEN  PREPARE RBC (CROSSMATCH)    Imaging Review Dg Chest Port 1 View  03/17/2016  CLINICAL DATA:  Dizziness. EXAM: PORTABLE CHEST 1 VIEW COMPARISON:  01/26/2014 FINDINGS: Chronic scarring at the bases. There is no edema, consolidation, effusion, or pneumothorax. Normal heart size and mediastinal contours. Left upper extremity stenting in this patient with end-stage renal disease. IMPRESSION: 1. No active disease. 2. Mild basilar scarring, stable from 2015. Electronically Signed   By: Monte Fantasia M.D.   On: 03/17/2016 06:57   I have personally reviewed and evaluated these images and lab results as part of my medical decision-making.   EKG Interpretation None      MDM   Final diagnoses:  Acute blood loss anemia  Acute upper GI bleed  Symptomatic anemia  ESRD (end stage renal disease) on dialysis ([place])    Pt comes in with weakness and missed dialysis. She is having orthostasis like symptoms and is noted to have melanotic stools. BUN >140,  Hb is 4. She is having active upper GI bleed, with prednisone as her risk factor for gastritis.  Will start blood transfusion. Will start protonix drip.  18 G and 22 G iv established in the ER. Hemodynamically stable.  Nephro and Gi consulted- AND BOTH services will see the patient once admitted.     Varney Biles, MD 03/17/16 [place], MD 03/17/16 [phone]

## 2016-03-17 NOTE — H&P (Signed)
History and Physical    [name] [id number] DOB: [birth year] DOA: 03/17/2016  Referring MD/NP/PA: Kathrynn Humble / ER PCP: Hoyt Koch, MD  Outpatient Specialists:  [facility] / [facility]; [place] Patient coming from: Private residence; has roommate who is currently hospitalized after undergoing BKA  Chief Complaint: Dizziness and melena  HPI: [name] is a 40 y.o. female with medical history significant for lupus on chronic prednisone, chronic kidney disease on dialysis with associated anemia and hyperparathyroidism, hypertension and mild depression since onset of dialysis treatments. Patient reports that beginning last Friday on 4/21 she had been feeling very weak and noticed multiple melanotic stools per day. She had not had any emesis. No bright red blood or clots. Yesterday on 4/28 she missed hemodialysis because she felt so weak and dizzy with movement and she was fearful to drive her motor vehicle. Yesterday she also had a total of 5 stools in 24 hours and this prompted her (along with the other symptoms) to seek treatment in the ER. In addition to the above symptoms she is having nausea upon standing and she believes she is having an acute lupus flare. She does not utilize NSAIDs on a daily basis and is not on a PPI. She has never had similar symptoms in the past.  ED Course:  Temp 98.2-BP 119/65-pulse 103-respirations 17-room air saturations 100% Orthostatic vital signs: Pending at time of dictation PCXR: No active disease and no evidence of volume overload or pulmonary edema Lab data: WBC 20,100, RBCs 1.37, hemoglobin 4.2, hematocrit 13, platelets 115,000, neutrophils 78%, absolute neutrophils 15.7%, PT 15.4, INR 1.2; electrolyte panel pending at time of admission; FOB positive **Patient has undergone type and screen and orders in place for 2 units packed red blood cells with CBC to follow Protonix infusion  Review of Systems:  In addition to  the HPI above,  No Fever-chills, myalgias or other constitutional symptoms No Headache, changes with Vision or hearing, new weakness, tingling, numbness in any extremity; patient reports dizziness upon standing No problems swallowing food or Liquids, indigestion/reflux No Chest pain, Cough or Shortness of Breath, palpitations, orthopnea or DOE No Abdominal pain, N/V No dysuria, hematuria or flank pain No new skin rashes, lesions, masses or bruises, No new joints pains-aches No recent weight gain or loss No polyuria, polydypsia or polyphagia,   Past Medical History  Diagnosis Date  . Hypertension   . Lupus ([place])   . Hyperlipemia   . Anemia   . Secondary hyperparathyroidism (of renal origin)   . 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 [id number])     Past Surgical History  Procedure Laterality Date  . Av fistula placement Left   . Incision and drainage abscess      gluteal  . Tibiocalcaneal fusion  left    . Hematoma evacuation Left 10/09/2013    Procedure: EVACUATION HEMATOMA;  Surgeon: Angelia Mould, MD;  Location: [place];  Service: Vascular;  Laterality: Left;  . Insertion of dialysis catheter N/A 10/09/2013    Procedure: INSERTION OF DIALYSIS CATHETER; ULTRASOUND GUIDED;  Surgeon: Angelia Mould, MD;  Location: [place];  Service: Vascular;  Laterality: N/A;  . Av fistula placement Right 01/26/2014    Procedure: ARTERIOVENOUS (AV) FISTULA CREATION; ultrasound guided;  Surgeon: Conrad [place], MD;  Location: [place];  Service: Vascular;  Laterality: Right;  . Shuntogram N/A 01/07/2014    Procedure: fistulogram with possibe venoplasty left upper arm avg;  Surgeon: Angelia Mould, MD;  Location: [facility] CATH LAB;  Service: Cardiovascular;  Laterality: N/A;     reports that she has never  smoked. She has never used smokeless tobacco. She reports that she does not drink alcohol or use illicit drugs.  Allergies  Allergen Reactions  . Amlodipine Hives    Swelling  . Sulfa Antibiotics Hives and Itching  . Vancomycin Hives and Itching    Family History  Problem Relation Age of Onset  . Asthma Sister   . Hypertension Mother   . Heart attack Mother       Prior to Admission medications   Medication Sig Start Date End Date Taking? Authorizing Provider  acetaminophen (TYLENOL) 500 MG tablet Take 500 mg by mouth as needed for headache.    Yes Historical Provider, MD  calcium acetate (PHOSLO) 667 MG capsule Take 1,334 mg by mouth 3 (three) times daily with meals.    Yes Historical Provider, MD  furosemide (LASIX) 80 MG tablet Take 80 mg by mouth 2 (two) times daily.    Yes Historical Provider, MD  predniSONE (DELTASONE) 10 MG tablet Take 10 mg by mouth daily with breakfast.   Yes Historical Provider, MD  traZODone (DESYREL) 50 MG tablet Take 25 mg by mouth at bedtime.    Yes Historical Provider, MD    Physical Exam: Filed Vitals:   03/17/16 [id number] 03/17/16 0649 03/17/16 0730 03/17/16 0803  BP:   108/58 103/58  Pulse:    89  Temp:    98 F (36.7 C)  TempSrc:    Oral  Resp:   18 19  SpO2: 100% 94%  99%      Constitutional: NAD, comfortableSomewhat anxious Filed Vitals:   03/17/16 0644 03/17/16 0649 03/17/16 0730 03/17/16 0803  BP:   108/58 103/58  Pulse:    89  Temp:    98 F (36.7 C)  TempSrc:    Oral  Resp:   18 19  SpO2: 100% 94%  99%   Eyes: PERRL, Unilateral left eye swelling, conjunctiva pale ENMT: Mucous membranes are moist. Posterior pharynx clear of any exudate or lesions.Normal dentition.  Neck: normal, supple, no masses, no thyromegaly Respiratory: clear to auscultation bilaterally, no wheezing, no crackles. Normal respiratory effort. No accessory muscle use.  Cardiovascular: Regular rate and rhythm, no murmurs / rubs / gallops. Focal chronic left  lower extremity edema that extends to foot. 2+ pedal pulses. No carotid bruits.  Abdomen: no tenderness, no masses palpated. No hepatosplenomegaly. Bowel sounds positive.  Musculoskeletal: no clubbing / cyanosis. No joint deformity upper and lower extremities. Good ROM, no contractures. Normal muscle tone.  Skin: no rashes, lesions, ulcers. No induration Neurologic: CN 2-12 grossly intact. Sensation intact, DTR normal. Strength 5/5 x all 4 extremities.  Psychiatric: Normal judgment and insight. Alert and oriented x 3. Normal mood.    Labs on Admission: I have personally reviewed following labs and imaging studies  CBC:  Recent Labs Lab 03/17/16 0638  WBC 20.1*  NEUTROABS 15.7*  HGB 4.2*  HCT 13.0*  MCV 94.9  PLT [id number]*   Basic Metabolic Panel: No results for input(s): NA, K, CL, CO2, GLUCOSE, BUN, CREATININE, CALCIUM, MG, PHOS in the last 168 hours. GFR: CrCl cannot be calculated (Unknown ideal weight.). Liver Function Tests: No results for  input(s): AST, ALT, ALKPHOS, BILITOT, PROT, ALBUMIN in the last 168 hours. No results for input(s): LIPASE, AMYLASE in the last 168 hours. No results for input(s): AMMONIA in the last 168 hours. Coagulation Profile:  Recent Labs Lab 03/17/16 0638  INR 1.20   Cardiac Enzymes: No results for input(s): CKTOTAL, CKMB, CKMBINDEX, TROPONINI in the last 168 hours. BNP (last 3 results) No results for input(s): PROBNP in the last 8760 hours. HbA1C: No results for input(s): HGBA1C in the last 72 hours. CBG: No results for input(s): GLUCAP in the last 168 hours. Lipid Profile: No results for input(s): CHOL, HDL, LDLCALC, TRIG, CHOLHDL, LDLDIRECT in the last 72 hours. Thyroid Function Tests: No results for input(s): TSH, T4TOTAL, FREET4, T3FREE, THYROIDAB in the last 72 hours. Anemia Panel: No results for input(s): VITAMINB12, FOLATE, FERRITIN, TIBC, IRON, RETICCTPCT in the last 72 hours. Urine analysis:    Component Value Date/Time    COLORURINE YELLOW 06/11/2011 0959   APPEARANCEUR TURBID* 06/11/2011 0959   LABSPEC 1.025 06/11/2011 0959   PHURINE 5.0 06/11/2011 0959   GLUCOSEU NEGATIVE 06/11/2011 0959   HGBUR MODERATE* 06/11/2011 0959   BILIRUBINUR NEGATIVE 06/11/2011 0959   KETONESUR NEGATIVE 06/11/2011 0959   PROTEINUR 100* 06/11/2011 0959   UROBILINOGEN 0.2 06/11/2011 0959   NITRITE NEGATIVE 06/11/2011 0959   LEUKOCYTESUR TRACE* 06/11/2011 0959   Sepsis Labs: @LABRCNTIP (procalcitonin:4,lacticidven:4) )No results found for this or any previous visit (from the past 240 hour(s)).   Radiological Exams on Admission: Dg Chest Port 1 View  03/17/2016  CLINICAL DATA:  Dizziness. EXAM: PORTABLE CHEST 1 VIEW COMPARISON:  01/26/2014 FINDINGS: Chronic scarring at the bases. There is no edema, consolidation, effusion, or pneumothorax. Normal heart size and mediastinal contours. Left upper extremity stenting in this patient with end-stage renal disease. IMPRESSION: 1. No active disease. 2. Mild basilar scarring, stable from 2015. Electronically Signed   By: Monte Fantasia M.D.   On: 03/17/2016 06:57     Assessment/Plan Principal Problem:   UGIB (upper gastrointestinal bleed)/ Melena -Given presentation with melena and known history of chronic prednisone suspect upper GI source -Continue Protonix infusion ordered in the ER -GI consultation pending -NPO  -H. pylori serologies  Active Problems:   Symptomatic and Acute blood loss anemia -Check orthostatic vital signs since patient reports dizziness with standing -Baseline hgb (2015) was 9.5 which pt reports is what she usually runs; today hgb down to 4.2 -Check anemia panel -Agree with transfusion 2 units PRBCs; CBC posttransfusion and again in a.m.    Thrombocytopenia  -Likely 2/2 consumption from ongoing GI bleeding symptoms for nearly 1 week -Could also be reflective of lupus flare -Follow labs -Current platelets greater than 100,000    ESRD on dialysis/Anemia  in chronic kidney disease/Secondary renal hyperparathyroidism  -Nephrology consultation pending -Patient missed dialysis yesterday (4/28) but has no respiratory symptoms; electrolyte panel pending, need to follow up on K+ level -Uncertain if patient needs to begin erythropoietin at least short-term until her hgb recovers from GI bleeding -Lasix documented on medication reconciliation-I have held this medication    Essential hypertension -Current blood pressure suboptimal -Not on antihypertensive medications prior to admission    Lupus  -On chronic prednisone 10 mg daily -I have changed to Solu-Medrol 20 mg IV daily until can resume orals -I suspect she will need chronic PPI if GI evaluation determines underlying cause to bleeding secondary to ulcer or gastritis -Patient also undergoing lupus flare; she reports her left eye and face will swell during a lupus flare-she  also has chronic LLE edema and skin changes which worsen w/ lupus flare    Adjustment disorder with mixed anxiety and depressed mood -Continue trazodone once able to take oral medications      DVT prophylaxis: SCDs  Code Status: Full code Family Communication: No family at bedside, roommate is currently hospitalized in [place] after BKA Disposition Plan: Return to previous home environment Consults called:  [place]/Nephrology; Henrene Pastor Fabienne Bruns Admission status: Stepdown/inpatient  Mobility: Without assistive devices   ELLIS,ALLISON L. ANP-BC [facility] Pager [phone]   If 7PM-7AM, please contact night-coverage [email or web address] Password [facility]  03/17/2016, 8:06 AM   Patient seen and evaluated. Agree with plan above.  Pt is a 40 y/o with history of SLE and ESRD on dialysis (M,W,F) who presented with main complaints of fatigue with generalized weakness and DOE as well as dark tarry stools. Patient mentions that she has been taking advil at night with her trazodone consecutively for the last 3 months. GI and  Nephro consulted. Agree with transfusion of 2 units of PRBC. Of note patient missed last dialysis session on Friday.    Gen: Pt in nad, alert and awake CV: s1 and s2 wnl, no rubs Pulm: no increased wob, no wheezes, equal chest rise.  Yukio Bisping, [employer]

## 2016-03-17 NOTE — Progress Notes (Signed)
Patient underwent endoscopy today that revealed 3 nonbleeding cratered duodenal ulcers in the duodenal bulb. After 2 units packed red blood cells hemoglobin has increased from 4.2 to 6.8 and platelets have dropped to 75,000. Discussed with Dr. Wendee Beavers and we will give 1 more unit of packed red blood cells and repeat CBC after transfusion and again in a.m. plan to infuse blood over 3 hours and hemodialysis is planned for today as well.  Erin Hearing, ANP

## 2016-03-17 NOTE — ED Notes (Signed)
Pt from home with c/o generalized weakness. Per pt, she missed dialysis Friday, woke up this morning feeling weak. Pt states every time she stands up, she feels like she is going to pass out.

## 2016-03-18 DIAGNOSIS — Z992 Dependence on renal dialysis: Secondary | ICD-10-CM | POA: Diagnosis not present

## 2016-03-18 DIAGNOSIS — N186 End stage renal disease: Secondary | ICD-10-CM | POA: Diagnosis not present

## 2016-03-18 DIAGNOSIS — M3210 Systemic lupus erythematosus, organ or system involvement unspecified: Secondary | ICD-10-CM | POA: Diagnosis not present

## 2016-03-18 LAB — RENAL FUNCTION PANEL
Albumin: 2.6 g/dL — ABNORMAL LOW (ref 3.5–5.0)
Anion gap: 14 (ref 5–15)
BUN: 62 mg/dL — AB (ref 6–20)
CHLORIDE: 101 mmol/L (ref 101–111)
CO2: 22 mmol/L (ref 22–32)
CREATININE: 6.1 mg/dL — AB (ref 0.44–1.00)
Calcium: 7.5 mg/dL — ABNORMAL LOW (ref 8.9–10.3)
GFR, EST AFRICAN AMERICAN: 9 mL/min — AB (ref >60)
GFR, EST NON AFRICAN AMERICAN: 8 mL/min — AB (ref >60)
Glucose, Bld: 126 mg/dL — ABNORMAL HIGH (ref 65–99)
POTASSIUM: 5.1 mmol/L (ref 3.5–5.1)
Phosphorus: 6.9 mg/dL — ABNORMAL HIGH (ref 2.5–4.6)
Sodium: 137 mmol/L (ref 135–145)

## 2016-03-18 LAB — CBC
HEMATOCRIT: 32.1 % — AB (ref 36.0–46.0)
Hemoglobin: 11.1 g/dL — ABNORMAL LOW (ref 12.0–15.0)
MCH: 29.4 pg (ref 26.0–34.0)
MCHC: 34.6 g/dL (ref 30.0–36.0)
MCV: 84.9 fL (ref 78.0–100.0)
PLATELETS: 59 10*3/uL — AB (ref 150–400)
RBC: 3.78 MIL/uL — ABNORMAL LOW (ref 3.87–5.11)
RDW: 18 % — AB (ref 11.5–15.5)
WBC: 16.5 10*3/uL — AB (ref 4.0–10.5)

## 2016-03-18 LAB — CLOTEST (H. PYLORI), BIOPSY: Helicobacter screen: NEGATIVE

## 2016-03-18 MED ORDER — CALCIUM ACETATE (PHOS BINDER) 667 MG PO CAPS
1334.0000 mg | ORAL_CAPSULE | Freq: Three times a day (TID) | ORAL | Status: DC
  Administered 2016-03-18 – 2016-03-19 (×5): 1334 mg via ORAL
  Filled 2016-03-18 (×5): qty 2

## 2016-03-18 NOTE — Progress Notes (Signed)
Report given to Gwenlyn Perking, RN on 6E. Patient transferred via wheelchair with belongings to [phone]. CCMD notified of transfer. Patient will not be on Telemetry.  Milford Cage, RN

## 2016-03-18 NOTE — Progress Notes (Signed)
PROGRESS NOTE                                                                                                                                                                                                             Patient Demographics:    [name], is a 40 y.o. female, DOB - February 20, 1976, [id number]  Admit date - 03/17/2016   Admitting Physician Velvet Bathe, MD  Outpatient Primary MD for the patient is Hoyt Koch, MD  LOS - 1  Outpatient Specialists: Hoyt Koch, MD   Chief Complaint  Patient presents with  . Weakness       Brief Narrative  40 y/o with history of SLE and ESRD on dialysis (M,W,F) who presented with main complaints of fatigue with generalized weakness and DOE as well as dark tarry stools. Patient mentions that she has been taking advil at night with her trazodone consecutively for the last 3 months. After GI evaluation was dx with 3 cratered duodenal ulcers.   Subjective:    [name] patient feels much better today. She has no complaints currently. She was told nephrology plans on dialyzing her tomorrow.   Assessment  & Plan :    Principal Problem:   UGIB (upper gastrointestinal bleed) Duodenal ulcer hemorrhagic - GI on board and managing - From our end will continue to monitor hemoglobin levels. Transfuse if necessary   Active Problems:   ESRD on dialysis ([place]) - Nephro on board and managing.    Anemia in chronic kidney disease - Nephrology assisting - Hemoglobin stable and reassess next a.m.    Essential hypertension    Lupus (HCC) - Solumedrol    Adjustment disorder with mixed anxiety and depressed mood   Melena   Thrombocytopenia (HCC)  - stable will continue to monitor   Esophageal stricture   Code Status : Full  Family Communication  : Directly with patient  Disposition Plan  : Discharge after tomorrow  Barriers For Discharge : Need  of dialysis. After dialysis most likely discharge  Consults  :  Nephrology and gastroenterology  Procedures  : HD  DVT Prophylaxis  :  Contraindicated in lieu of GI bleed and anemia  Lab Results  Component Value Date   PLT 59* 03/18/2016    Antibiotics  :  None  Anti-infectives    None  Objective:   Filed Vitals:   03/18/16 0500 03/18/16 0600 03/18/16 0700 03/18/16 1157  BP: 105/68 101/69 115/75 120/89  Pulse: 90 78 75 84  Temp:   98 F (36.7 C) 97.8 F (36.6 C)  TempSrc:   Oral Oral  Resp: 14 20 15 26   Height:      Weight:      SpO2: 100% 100% 100% 100%    Wt Readings from Last 3 Encounters:  03/18/16 57.2 kg (126 lb 1.7 oz)  10/25/15 53.524 kg (118 lb)  05/24/15 53.978 kg (119 lb)     Intake/Output Summary (Last 24 hours) at 03/18/16 1415 Last data filed at 03/18/16 0911  Gross per 24 hour  Intake   1578 ml  Output   2164 ml  Net   -586 ml     Physical Exam  Awake Alert, Oriented X 3, No new F.N deficits, Normal affect Supple Neck,No JVD, No cervical lymphadenopathy appriciated.  Symmetrical Chest wall movement, Good air movement bilaterally, CTAB RRR,No Gallops,Rubs or new Murmurs +ve B.Sounds, Abd Soft, No tenderness, No organomegaly appriciated, No rebound - guarding or rigidity. No Cyanosis, Clubbing or edema    Data Review:    CBC  Recent Labs Lab 03/17/16 0638 03/17/16 1520 03/18/16 0244  WBC 20.1* 21.2* 16.5*  HGB 4.2* 6.8* 11.1*  HCT 13.0* 20.6* 32.1*  PLT 115* 75* 59*  MCV 94.9 87.7 84.9  MCH 30.7 28.9 29.4  MCHC 32.3 33.0 34.6  RDW 23.2* 19.2* 18.0*  LYMPHSABS 2.8  --   --   MONOABS 1.6*  --   --   EOSABS 0.0  --   --   BASOSABS 0.0  --   --     Chemistries   Recent Labs Lab 03/17/16 1100 03/17/16 1520 03/18/16 0244  NA 133* 133* 137  K 5.3* 6.1* 5.1  CL 97* 100* 101  CO2 17* 17* 22  GLUCOSE 92 110* 126*  BUN 188* 184* 62*  CREATININE 11.80* 11.66* 6.10*  CALCIUM 8.4* 8.0* 7.5*    ------------------------------------------------------------------------------------------------------------------ No results for input(s): CHOL, HDL, LDLCALC, TRIG, CHOLHDL, LDLDIRECT in the last 72 hours.  Lab Results  Component Value Date   HGBA1C 6.6* 06/08/2011   ------------------------------------------------------------------------------------------------------------------ No results for input(s): TSH, T4TOTAL, T3FREE, THYROIDAB in the last 72 hours.  Invalid input(s): FREET3 ------------------------------------------------------------------------------------------------------------------  Recent Labs  03/17/16 0743  VITAMINB12 212  FOLATE 8.8  FERRITIN 959*  TIBC 178*  IRON 130  RETICCTPCT 9.3*    Coagulation profile  Recent Labs Lab 03/17/16 0638  INR 1.20    No results for input(s): DDIMER in the last 72 hours.  Cardiac Enzymes No results for input(s): CKMB, TROPONINI, MYOGLOBIN in the last 168 hours.  Invalid input(s): CK ------------------------------------------------------------------------------------------------------------------ No results found for: BNP  Inpatient Medications  Scheduled Meds: . sodium chloride   Intravenous Once  . calcium acetate  1,334 mg Oral TID WC  . [START ON 03/19/2016] doxercalciferol  1 mcg Intravenous Q M,W,F-HD  . methylPREDNISolone (SOLU-MEDROL) injection  20 mg Intravenous Daily  . [START ON 03/20/2016] pantoprazole (PROTONIX) IV  40 mg Intravenous Q12H  . sodium chloride flush  3 mL Intravenous Q12H  . traZODone  25 mg Oral QHS   Continuous Infusions: . pantoprozole (PROTONIX) infusion 8 mg/hr (03/18/16 0538)   PRN Meds:.acetaminophen **OR** acetaminophen, ondansetron **OR** ondansetron (ZOFRAN) IV  Micro Results Recent Results (from the past 240 hour(s))  Blood culture (routine x 2)     Status: None (Preliminary result)  Collection Time: 03/17/16  7:43 AM  Result Value Ref Range Status   Specimen  Description BLOOD RIGHT WRIST  Final   Special Requests BOTTLES DRAWN AEROBIC ONLY 5CC  Final   Culture NO GROWTH 1 DAY  Final   Report Status PENDING  Incomplete  Blood culture (routine x 2)     Status: None (Preliminary result)   Collection Time: 03/17/16  7:50 AM  Result Value Ref Range Status   Specimen Description BLOOD RIGHT ANTECUBITAL  Final   Special Requests BOTTLES DRAWN AEROBIC ONLY 5CC  Final   Culture NO GROWTH 1 DAY  Final   Report Status PENDING  Incomplete  MRSA PCR Screening     Status: None   Collection Time: 03/17/16  9:54 AM  Result Value Ref Range Status   MRSA by PCR NEGATIVE NEGATIVE Final    Comment:        The GeneXpert MRSA Assay (FDA approved for NASAL specimens only), is one component of a comprehensive MRSA colonization surveillance program. It is not intended to diagnose MRSA infection nor to guide or monitor treatment for MRSA infections.     Radiology Reports Dg Chest Port 1 View  03/17/2016  CLINICAL DATA:  Dizziness. EXAM: PORTABLE CHEST 1 VIEW COMPARISON:  01/26/2014 FINDINGS: Chronic scarring at the bases. There is no edema, consolidation, effusion, or pneumothorax. Normal heart size and mediastinal contours. Left upper extremity stenting in this patient with end-stage renal disease. IMPRESSION: 1. No active disease. 2. Mild basilar scarring, stable from 2015. Electronically Signed   By: Monte Fantasia M.D.   On: 03/17/2016 06:57    Time Spent in minutes  25   Velvet Bathe M.D on 03/18/2016 at 2:15 PM  Between 7am to 7pm - Pager - [phone]  After 7pm go to [email or web address] - password [email or web address]  [facility] -  Office  [phone]

## 2016-03-18 NOTE — Progress Notes (Signed)
  [facility] Progress Note   Assessment/ Plan:   1. Acute blood loss anemia secondary to gastrointestinal bleed- hemoglobin improved with packed red cell transfusion and she is currently hemodynamically stable. Endoscopy yesterday showed 3 nonbleeding cratered duodenal ulcers in the duodenal bulb with incident esophageal stricture. She denies any further bloody stools or hematemesis overnight. Clinically doing better. 2. ESRD -usually MWF@[facility] - hemodialysis done yesterday because she had missed her dialysis treatment on Friday and there was need for large volumes of transfusion in the face of hyperkalemia. Today she has borderline hyperkalemia noted however, will be able to tolerate this until dialysis treatment tomorrow. 3. Hypertension/volume - on physical exam, slightly hypervolemic, attempt ultrafiltration with hemodialysis tomorrow 4. Metabolic bone disease - Continue hectorol- restart binders when she is on a consistent oral intake 5. Nutrition - she is currently nothing by mouth but anticipate diet to be started today now that gastrointestinal bleeding has stopped. 6. SLE - IV steroids- Transition to usual oral dose today now that gastrointestinal bleeding stopped/hemodynamic status back to baseline.  Subjective:   Reports to be feeling much better today status post blood transfusion yesterday. No further blood loss in stool She denies any chest pain or shortness of breath   Objective:   BP 115/75 mmHg  Pulse 75  Temp(Src) 98 F (36.7 C) (Oral)  Resp 15  Ht 5\' 4"  (1.626 m)  Wt 57.2 kg (126 lb 1.7 oz)  BMI 21.63 kg/m2  SpO2 100%  LMP 01/18/2016  Physical Exam: Gen: comfortable resting on the side of her bed CVS: pulse regular rhythm, S1 and S2 with ESM Resp: occasional expiratory wheeze otherwise clear to auscultation, no rales Abd: soft, flat, nontender Ext: trace to 1+ ankle edema bilaterally  Labs: BMET  Recent Labs Lab 03/17/16 1100 03/17/16 1520  03/18/16 0244  NA 133* 133* 137  K 5.3* 6.1* 5.1  CL 97* 100* 101  CO2 17* 17* 22  GLUCOSE 92 110* 126*  BUN 188* 184* 62*  CREATININE 11.80* 11.66* 6.10*  CALCIUM 8.4* 8.0* 7.5*  PHOS  --  4.5 6.9*   CBC  Recent Labs Lab 03/17/16 0638 03/17/16 1520 03/18/16 0244  WBC 20.1* 21.2* 16.5*  NEUTROABS 15.7*  --   --   HGB 4.2* 6.8* 11.1*  HCT 13.0* 20.6* 32.1*  MCV 94.9 87.7 84.9  PLT 115* 75* 59*   Medications:    . sodium chloride   Intravenous Once  . calcium acetate  1,334 mg Oral TID WC  . [START ON 03/19/2016] doxercalciferol  1 mcg Intravenous Q M,W,F-HD  . methylPREDNISolone (SOLU-MEDROL) injection  20 mg Intravenous Daily  . [START ON 03/20/2016] pantoprazole (PROTONIX) IV  40 mg Intravenous Q12H  . sodium chloride flush  3 mL Intravenous Q12H  . traZODone  25 mg Oral QHS   Elmarie Shiley, MD 03/18/2016, 8:02 AM

## 2016-03-19 ENCOUNTER — Encounter (HOSPITAL_COMMUNITY): Payer: Self-pay | Admitting: Internal Medicine

## 2016-03-19 LAB — RENAL FUNCTION PANEL
ALBUMIN: 2.4 g/dL — AB (ref 3.5–5.0)
Anion gap: 15 (ref 5–15)
BUN: 100 mg/dL — AB (ref 6–20)
CHLORIDE: 101 mmol/L (ref 101–111)
CO2: 22 mmol/L (ref 22–32)
CREATININE: 8.98 mg/dL — AB (ref 0.44–1.00)
Calcium: 7.6 mg/dL — ABNORMAL LOW (ref 8.9–10.3)
GFR calc Af Amer: 6 mL/min — ABNORMAL LOW (ref >60)
GFR, EST NON AFRICAN AMERICAN: 5 mL/min — AB (ref >60)
Glucose, Bld: 113 mg/dL — ABNORMAL HIGH (ref 65–99)
Phosphorus: 7.5 mg/dL — ABNORMAL HIGH (ref 2.5–4.6)
Potassium: 4.9 mmol/L (ref 3.5–5.1)
Sodium: 138 mmol/L (ref 135–145)

## 2016-03-19 LAB — CBC
HCT: 30.4 % — ABNORMAL LOW (ref 36.0–46.0)
HCT: 31.1 % — ABNORMAL LOW (ref 36.0–46.0)
Hemoglobin: 10.1 g/dL — ABNORMAL LOW (ref 12.0–15.0)
Hemoglobin: 10.4 g/dL — ABNORMAL LOW (ref 12.0–15.0)
MCH: 28.7 pg (ref 26.0–34.0)
MCH: 29.1 pg (ref 26.0–34.0)
MCHC: 33.2 g/dL (ref 30.0–36.0)
MCHC: 33.4 g/dL (ref 30.0–36.0)
MCV: 86.4 fL (ref 78.0–100.0)
MCV: 87.1 fL (ref 78.0–100.0)
PLATELETS: 62 10*3/uL — AB (ref 150–400)
PLATELETS: 47 10*3/uL — AB (ref 150–400)
RBC: 3.52 MIL/uL — ABNORMAL LOW (ref 3.87–5.11)
RBC: 3.57 MIL/uL — ABNORMAL LOW (ref 3.87–5.11)
RDW: 18.9 % — AB (ref 11.5–15.5)
RDW: 18.8 % — AB (ref 11.5–15.5)
WBC: 11.6 10*3/uL — AB (ref 4.0–10.5)
WBC: 12.5 10*3/uL — AB (ref 4.0–10.5)

## 2016-03-19 MED ORDER — WHITE PETROLATUM GEL
Status: AC
  Filled 2016-03-19: qty 1

## 2016-03-19 MED ORDER — PANTOPRAZOLE SODIUM 40 MG PO TBEC: 40.0000 mg | DELAYED_RELEASE_TABLET | Freq: Two times a day (BID) | ORAL | Status: DC

## 2016-03-19 MED ORDER — RENA-VITE PO TABS: 1.0000 | ORAL_TABLET | Freq: Every day | ORAL | Status: DC

## 2016-03-19 MED ORDER — DOXERCALCIFEROL 4 MCG/2ML IV SOLN
INTRAVENOUS | Status: AC
  Filled 2016-03-19: qty 2

## 2016-03-19 MED ORDER — PRO-STAT SUGAR FREE PO LIQD
30.0000 mL | Freq: Two times a day (BID) | ORAL | Status: DC
  Administered 2016-03-19: 30 mL via ORAL
  Filled 2016-03-19: qty 30

## 2016-03-19 NOTE — Progress Notes (Signed)
[facility] Progress Note   Subjective: "I don't believe they got enough fluid off Saturday..." Patient denies further bloody stools, No C/O pain. Had HD off schedule 04/29 did leave 1.6 above EDW.  For HD today.   Objective Filed Vitals:   03/18/16 1800 03/18/16 2206 03/19/16 0651 03/19/16 0929  BP: 127/87 120/80 114/75 128/81  Pulse: 86 67 80 76  Temp:  98.1 F (36.7 C) 97.7 F (36.5 C) 97.6 F (36.4 C)  TempSrc:  Oral Oral Oral  Resp: 23 18 20 18   Height:      Weight:      SpO2: 100% 100% 100% 100%   Physical Exam General: Very pleasant, NAD Heart: S1, S2, soft I/VI M. No JVD Lungs: Bilateral breath sounds CTA A/P Abdomen: soft, nondistended, nontender with active BS X 4 quads. Extremities: LLE 1+ RLE trace edema Dialysis Access: LUA AVG + bruit   Additional Objective Labs: Basic Metabolic Panel:  Recent Labs Lab 03/17/16 1520 03/18/16 0244 03/19/16 0530  NA 133* 137 138  K 6.1* 5.1 4.9  CL 100* 101 101  CO2 17* 22 22  GLUCOSE 110* 126* 113*  BUN 184* 62* 100*  CREATININE 11.66* 6.10* 8.98*  CALCIUM 8.0* 7.5* 7.6*  PHOS 4.5 6.9* 7.5*   Liver Function Tests:  Recent Labs Lab 03/17/16 1520 03/18/16 0244 03/19/16 0530  ALBUMIN 2.2* 2.6* 2.4*    CBC:  Recent Labs Lab 03/17/16 0638 03/17/16 1520 03/18/16 0244 03/19/16 0015 03/19/16 0530  WBC 20.1* 21.2* 16.5* 11.6* 12.5*  NEUTROABS 15.7*  --   --   --   --   HGB 4.2* 6.8* 11.1* 10.1* 10.4*  HCT 13.0* 20.6* 32.1* 30.4* 31.1*  MCV 94.9 87.7 84.9 86.4 87.1  PLT 115* 75* 59* 62* 47*   Blood Culture    Component Value Date/Time   SDES BLOOD RIGHT ANTECUBITAL 03/17/2016 0750   SPECREQUEST BOTTLES DRAWN AEROBIC ONLY 5CC 03/17/2016 0750   CULT NO GROWTH 1 DAY 03/17/2016 0750   REPTSTATUS PENDING 03/17/2016 0750    Iron Studies:  Recent Labs  03/17/16 0743  IRON 130  TIBC 178*  FERRITIN 959*    Studies/Results: No results found. Medications: . pantoprozole  (PROTONIX) infusion 8 mg/hr (03/18/16 1814)   . sodium chloride   Intravenous Once  . calcium acetate  1,334 mg Oral TID WC  . doxercalciferol  1 mcg Intravenous Q M,W,F-HD  . methylPREDNISolone (SOLU-MEDROL) injection  20 mg Intravenous Daily  . [START ON 03/20/2016] pantoprazole (PROTONIX) IV  40 mg Intravenous Q12H  . sodium chloride flush  3 mL Intravenous Q12H  . traZODone  25 mg Oral QHS   Dialysis Orders: MWF [place] 3 hrs 30 min, 180NRe Optiflux,   EDW 54 (kg),  2.0 K 2.25 Ca LUA AVG Hectoral: 1 mcg IV q MWF Mircera: 150 mcg IV q 2 weeks (last dose 03/05/16) Heparin: 1800 units q treatment  Assessment/Plan: 1. Acute blood loss anemia secondary to gastrointestinal bleed- hemoglobin improved with packed red cell transfusion and she is currently hemodynamically stable. Endoscopy yesterday showed 3 nonbleeding cratered duodenal ulcers in the duodenal bulb with incident esophageal stricture. No further bloody stools. HGB 10.4 today.  2. ESRD -usually MWF@[facility] - hemodialysis done yesterday because she had missed her dialysis treatment on Friday and there was need for large volumes of transfusion in the face of hyperkalemia. For HD today. Hyperkalemia on adm. Now resolved. K+ 4.9 2.0 K bath today. No heparin.  3. Hypertension/volume - Last HD  off schedule 03/17/16 Pre wt 57.7 kg Net UF 2164 Post wt 55.6 kg. Still above OP EDW. UFG 2-3 liters today.  4. Metabolic bone disease - Continue hectorol- binders restarted Ca 7.6 C Ca 8.88 Phos ^ 7.5 5. Nutrition - Albumin 2.4 Renal diet/renal vits-add prostat.  6. SLE - IV steroids- Transition to usual oral dose today now that gastrointestinal bleeding stopped/hemodynamic status back to baseline. Per primary.   Rita H. Brown NP-C 03/19/2016, 11:28 AM  [facility] [phone]  Pt seen, examined and agree w A/P as above.  Kelly Splinter MD [employer] pager [phone]    cell [phone] 03/19/2016, 12:27 PM

## 2016-03-19 NOTE — Discharge Summary (Signed)
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[place])   Essential hypertension   Lupus ([place])   Adjustment disorder with mixed anxiety and depressed mood   Melena   Symptomatic anemia   Acute blood loss anemia   Thrombocytopenia (HCC)   Duodenal ulcer hemorrhagic   Esophageal stricture   Discharge Condition: stable  Diet recommendation: renal diet  Filed Weights   03/17/16 1839 03/18/16 0416 03/19/16 1245  Weight: 55.6 kg (122 lb 9.2 oz) 57.2 kg (126 lb 1.7 oz) 57.7 kg (127 lb 3.3 oz)    History of present illness:  40 y/o with history of SLE and ESRD on dialysis (M,W,F) who presented with main complaints of fatigue with generalized weakness and DOE as well as dark tarry stools. Patient mentions that she has been taking advil at night with her trazodone consecutively for the last 3 months. GI and Nephro consulted. Agree with transfusion of 2 units of PRBC. Of note patient missed last dialysis session on Friday.   Hospital Course:  Principal Problem:  Melena/UGIB (upper gastrointestinal bleed) Duodenal ulcer hemorrhagic - GI on board and Pt diagnosed with duodenal ulcers. Will discharge on protonix 40 mg po bid - Hemoglobin stable after transfusion.  - recommended avoiding nsaids.   Active Problems:  ESRD on dialysis ([place]) - Pt to follow up with Nephrologist as outpatient.    Anemia in chronic kidney disease - Hemoglobin stable at day of d/c   Essential hypertension  Lupus ([place]) - continue home  prednisone   Adjustment disorder with mixed anxiety and depressed mood   Thrombocytopenia (HCC) - stable will continue to monitor  Esophageal stricture - Pt to f/u with GI as outpatient .  Procedures:  EGD  Consultations:  GI  Nephrology  Discharge Exam: Filed Vitals:   03/19/16 1600 03/19/16 1635  BP: 126/73 134/82  Pulse: 74 71  Temp:  97.1 F (36.2 C)  Resp:  15    General: Pt in nad, alert and awake Cardiovascular: rrr, no rubs Respiratory: no increased wob, no wheezes  Discharge Instructions   Discharge Instructions    Call MD for:  difficulty breathing, headache or visual disturbances    Complete by:  As directed      Call MD for:  temperature >100.4    Complete by:  As directed      Diet - low sodium heart healthy    Complete by:  As directed      Discharge instructions    Complete by:  As directed   Please avoid any NSAIDs. Also f/u with GI after discharge.  Also f/u with Nephro routinely.     Increase activity slowly    Complete by:  As directed           Current Discharge Medication List    START taking these medications   Details  multivitamin (RENA-VIT) TABS tablet Take 1 tablet by mouth at bedtime. Qty: 30 each, Refills: 0    pantoprazole (PROTONIX) 40 MG tablet Take 1 tablet (40 mg total) by mouth 2 (two) times daily. Qty: 60 tablet, Refills:  0      CONTINUE these medications which have NOT CHANGED   Details  acetaminophen (TYLENOL) 500 MG tablet Take 500 mg by mouth as needed for headache.     calcium acetate (PHOSLO) 667 MG capsule Take 1,334 mg by mouth 3 (three) times daily with meals.     predniSONE (DELTASONE) 10 MG tablet Take 10 mg by mouth daily with breakfast.    traZODone (DESYREL) 50 MG tablet Take 25 mg by mouth at bedtime.       STOP taking these medications     furosemide (LASIX) 80 MG tablet        Allergies  Allergen Reactions  . Amlodipine Hives    Swelling  . Sulfa Antibiotics Hives and Itching   . Vancomycin Hives and Itching      The results of significant diagnostics from this hospitalization (including imaging, microbiology, ancillary and laboratory) are listed below for reference.    Significant Diagnostic Studies: Dg Chest Port 1 View  03/17/2016  CLINICAL DATA:  Dizziness. EXAM: PORTABLE CHEST 1 VIEW COMPARISON:  01/26/2014 FINDINGS: Chronic scarring at the bases. There is no edema, consolidation, effusion, or pneumothorax. Normal heart size and mediastinal contours. Left upper extremity stenting in this patient with end-stage renal disease. IMPRESSION: 1. No active disease. 2. Mild basilar scarring, stable from 2015. Electronically Signed   By: Monte Fantasia M.D.   On: 03/17/2016 06:57    Microbiology: Recent Results (from the past 240 hour(s))  Blood culture (routine x 2)     Status: None (Preliminary result)   Collection Time: 03/17/16  7:43 AM  Result Value Ref Range Status   Specimen Description BLOOD RIGHT WRIST  Final   Special Requests BOTTLES DRAWN AEROBIC ONLY 5CC  Final   Culture NO GROWTH 2 DAYS  Final   Report Status PENDING  Incomplete  Blood culture (routine x 2)     Status: None (Preliminary result)   Collection Time: 03/17/16  7:50 AM  Result Value Ref Range Status   Specimen Description BLOOD RIGHT ANTECUBITAL  Final   Special Requests BOTTLES DRAWN AEROBIC ONLY 5CC  Final   Culture NO GROWTH 2 DAYS  Final   Report Status PENDING  Incomplete  MRSA PCR Screening     Status: None   Collection Time: 03/17/16  9:54 AM  Result Value Ref Range Status   MRSA by PCR NEGATIVE NEGATIVE Final    Comment:        The GeneXpert MRSA Assay (FDA approved for NASAL specimens only), is one component of a comprehensive MRSA colonization surveillance program. It is not intended to diagnose MRSA infection nor to guide or monitor treatment for MRSA infections.      Labs: Basic Metabolic Panel:  Recent Labs Lab 03/17/16 1100 03/17/16 1520  03/18/16 0244 03/19/16 0530  NA 133* 133* 137 138  K 5.3* 6.1* 5.1 4.9  CL 97* 100* 101 101  CO2 17* 17* 22 22  GLUCOSE 92 110* 126* 113*  BUN 188* 184* 62* 100*  CREATININE 11.80* 11.66* 6.10* 8.98*  CALCIUM 8.4* 8.0* 7.5* 7.6*  PHOS  --  4.5 6.9* 7.5*   Liver Function Tests:  Recent Labs Lab 03/17/16 1520 03/18/16 0244 03/19/16 0530  ALBUMIN 2.2* 2.6* 2.4*   No results for input(s): LIPASE, AMYLASE in the last 168 hours. No results for input(s): AMMONIA in the last 168 hours. CBC:  Recent Labs Lab 03/17/16 0638 03/17/16 1520 03/18/16 0244 03/19/16 0015 03/19/16 0530  WBC  20.1* 21.2* 16.5* 11.6* 12.5*  NEUTROABS 15.7*  --   --   --   --   HGB 4.2* 6.8* 11.1* 10.1* 10.4*  HCT 13.0* 20.6* 32.1* 30.4* 31.1*  MCV 94.9 87.7 84.9 86.4 87.1  PLT 115* 75* 59* 62* 47*   Cardiac Enzymes: No results for input(s): CKTOTAL, CKMB, CKMBINDEX, TROPONINI in the last 168 hours. BNP: BNP (last 3 results) No results for input(s): BNP in the last 8760 hours.  ProBNP (last 3 results) No results for input(s): PROBNP in the last 8760 hours.  CBG: No results for input(s): GLUCAP in the last 168 hours.     Signed:  Velvet Bathe MD.  [facility] 03/19/2016, 5:42 PM

## 2016-03-20 LAB — TYPE AND SCREEN
ABO/RH(D): O POS
Antibody Screen: NEGATIVE
UNIT DIVISION: 0
UNIT DIVISION: 0
UNIT DIVISION: 0
UNIT DIVISION: 0
Unit division: 0
Unit division: 0

## 2016-03-21 DIAGNOSIS — N2581 Secondary hyperparathyroidism of renal origin: Secondary | ICD-10-CM | POA: Diagnosis not present

## 2016-03-21 DIAGNOSIS — N186 End stage renal disease: Secondary | ICD-10-CM | POA: Diagnosis not present

## 2016-03-21 DIAGNOSIS — D631 Anemia in chronic kidney disease: Secondary | ICD-10-CM | POA: Diagnosis not present

## 2016-03-22 LAB — CULTURE, BLOOD (ROUTINE X 2)
Culture: NO GROWTH
Culture: NO GROWTH

## 2016-03-23 DIAGNOSIS — N186 End stage renal disease: Secondary | ICD-10-CM | POA: Diagnosis not present

## 2016-03-23 DIAGNOSIS — N2581 Secondary hyperparathyroidism of renal origin: Secondary | ICD-10-CM | POA: Diagnosis not present

## 2016-03-23 DIAGNOSIS — D631 Anemia in chronic kidney disease: Secondary | ICD-10-CM | POA: Diagnosis not present

## 2016-03-26 DIAGNOSIS — N2581 Secondary hyperparathyroidism of renal origin: Secondary | ICD-10-CM | POA: Diagnosis not present

## 2016-03-26 DIAGNOSIS — N186 End stage renal disease: Secondary | ICD-10-CM | POA: Diagnosis not present

## 2016-03-26 DIAGNOSIS — D631 Anemia in chronic kidney disease: Secondary | ICD-10-CM | POA: Diagnosis not present

## 2016-03-28 DIAGNOSIS — D631 Anemia in chronic kidney disease: Secondary | ICD-10-CM | POA: Diagnosis not present

## 2016-03-28 DIAGNOSIS — N186 End stage renal disease: Secondary | ICD-10-CM | POA: Diagnosis not present

## 2016-03-28 DIAGNOSIS — N2581 Secondary hyperparathyroidism of renal origin: Secondary | ICD-10-CM | POA: Diagnosis not present

## 2016-03-30 DIAGNOSIS — N186 End stage renal disease: Secondary | ICD-10-CM | POA: Diagnosis not present

## 2016-03-30 DIAGNOSIS — N2581 Secondary hyperparathyroidism of renal origin: Secondary | ICD-10-CM | POA: Diagnosis not present

## 2016-03-30 DIAGNOSIS — D631 Anemia in chronic kidney disease: Secondary | ICD-10-CM | POA: Diagnosis not present

## 2016-04-02 DIAGNOSIS — D631 Anemia in chronic kidney disease: Secondary | ICD-10-CM | POA: Diagnosis not present

## 2016-04-02 DIAGNOSIS — N2581 Secondary hyperparathyroidism of renal origin: Secondary | ICD-10-CM | POA: Diagnosis not present

## 2016-04-02 DIAGNOSIS — N186 End stage renal disease: Secondary | ICD-10-CM | POA: Diagnosis not present

## 2016-04-04 DIAGNOSIS — N2581 Secondary hyperparathyroidism of renal origin: Secondary | ICD-10-CM | POA: Diagnosis not present

## 2016-04-04 DIAGNOSIS — D631 Anemia in chronic kidney disease: Secondary | ICD-10-CM | POA: Diagnosis not present

## 2016-04-04 DIAGNOSIS — N186 End stage renal disease: Secondary | ICD-10-CM | POA: Diagnosis not present

## 2016-04-06 DIAGNOSIS — D631 Anemia in chronic kidney disease: Secondary | ICD-10-CM | POA: Diagnosis not present

## 2016-04-06 DIAGNOSIS — N2581 Secondary hyperparathyroidism of renal origin: Secondary | ICD-10-CM | POA: Diagnosis not present

## 2016-04-06 DIAGNOSIS — N186 End stage renal disease: Secondary | ICD-10-CM | POA: Diagnosis not present

## 2016-04-09 DIAGNOSIS — D631 Anemia in chronic kidney disease: Secondary | ICD-10-CM | POA: Diagnosis not present

## 2016-04-09 DIAGNOSIS — N186 End stage renal disease: Secondary | ICD-10-CM | POA: Diagnosis not present

## 2016-04-09 DIAGNOSIS — N2581 Secondary hyperparathyroidism of renal origin: Secondary | ICD-10-CM | POA: Diagnosis not present

## 2016-04-11 DIAGNOSIS — N2581 Secondary hyperparathyroidism of renal origin: Secondary | ICD-10-CM | POA: Diagnosis not present

## 2016-04-11 DIAGNOSIS — N186 End stage renal disease: Secondary | ICD-10-CM | POA: Diagnosis not present

## 2016-04-11 DIAGNOSIS — D631 Anemia in chronic kidney disease: Secondary | ICD-10-CM | POA: Diagnosis not present

## 2016-04-12 DIAGNOSIS — N939 Abnormal uterine and vaginal bleeding, unspecified: Secondary | ICD-10-CM | POA: Diagnosis not present

## 2016-04-13 DIAGNOSIS — D631 Anemia in chronic kidney disease: Secondary | ICD-10-CM | POA: Diagnosis not present

## 2016-04-13 DIAGNOSIS — N186 End stage renal disease: Secondary | ICD-10-CM | POA: Diagnosis not present

## 2016-04-13 DIAGNOSIS — N2581 Secondary hyperparathyroidism of renal origin: Secondary | ICD-10-CM | POA: Diagnosis not present

## 2016-04-16 DIAGNOSIS — N2581 Secondary hyperparathyroidism of renal origin: Secondary | ICD-10-CM | POA: Diagnosis not present

## 2016-04-16 DIAGNOSIS — N186 End stage renal disease: Secondary | ICD-10-CM | POA: Diagnosis not present

## 2016-04-16 DIAGNOSIS — D631 Anemia in chronic kidney disease: Secondary | ICD-10-CM | POA: Diagnosis not present

## 2016-04-18 DIAGNOSIS — N912 Amenorrhea, unspecified: Secondary | ICD-10-CM | POA: Diagnosis not present

## 2016-04-18 DIAGNOSIS — N2581 Secondary hyperparathyroidism of renal origin: Secondary | ICD-10-CM | POA: Diagnosis not present

## 2016-04-18 DIAGNOSIS — Z992 Dependence on renal dialysis: Secondary | ICD-10-CM | POA: Diagnosis not present

## 2016-04-18 DIAGNOSIS — M3210 Systemic lupus erythematosus, organ or system involvement unspecified: Secondary | ICD-10-CM | POA: Diagnosis not present

## 2016-04-18 DIAGNOSIS — N186 End stage renal disease: Secondary | ICD-10-CM | POA: Diagnosis not present

## 2016-04-18 DIAGNOSIS — D631 Anemia in chronic kidney disease: Secondary | ICD-10-CM | POA: Diagnosis not present

## 2016-04-20 DIAGNOSIS — N186 End stage renal disease: Secondary | ICD-10-CM | POA: Diagnosis not present

## 2016-04-20 DIAGNOSIS — D631 Anemia in chronic kidney disease: Secondary | ICD-10-CM | POA: Diagnosis not present

## 2016-04-20 DIAGNOSIS — N2581 Secondary hyperparathyroidism of renal origin: Secondary | ICD-10-CM | POA: Diagnosis not present

## 2016-04-20 DIAGNOSIS — Z23 Encounter for immunization: Secondary | ICD-10-CM | POA: Diagnosis not present

## 2016-04-23 DIAGNOSIS — Z23 Encounter for immunization: Secondary | ICD-10-CM | POA: Diagnosis not present

## 2016-04-23 DIAGNOSIS — N2581 Secondary hyperparathyroidism of renal origin: Secondary | ICD-10-CM | POA: Diagnosis not present

## 2016-04-23 DIAGNOSIS — D631 Anemia in chronic kidney disease: Secondary | ICD-10-CM | POA: Diagnosis not present

## 2016-04-23 DIAGNOSIS — N186 End stage renal disease: Secondary | ICD-10-CM | POA: Diagnosis not present

## 2016-04-24 ENCOUNTER — Ambulatory Visit: Payer: Medicare Other | Admitting: Internal Medicine

## 2016-04-25 DIAGNOSIS — N2581 Secondary hyperparathyroidism of renal origin: Secondary | ICD-10-CM | POA: Diagnosis not present

## 2016-04-25 DIAGNOSIS — N186 End stage renal disease: Secondary | ICD-10-CM | POA: Diagnosis not present

## 2016-04-25 DIAGNOSIS — Z23 Encounter for immunization: Secondary | ICD-10-CM | POA: Diagnosis not present

## 2016-04-25 DIAGNOSIS — D631 Anemia in chronic kidney disease: Secondary | ICD-10-CM | POA: Diagnosis not present

## 2016-04-27 DIAGNOSIS — N2581 Secondary hyperparathyroidism of renal origin: Secondary | ICD-10-CM | POA: Diagnosis not present

## 2016-04-27 DIAGNOSIS — Z23 Encounter for immunization: Secondary | ICD-10-CM | POA: Diagnosis not present

## 2016-04-27 DIAGNOSIS — D631 Anemia in chronic kidney disease: Secondary | ICD-10-CM | POA: Diagnosis not present

## 2016-04-27 DIAGNOSIS — N186 End stage renal disease: Secondary | ICD-10-CM | POA: Diagnosis not present

## 2016-04-30 DIAGNOSIS — N186 End stage renal disease: Secondary | ICD-10-CM | POA: Diagnosis not present

## 2016-04-30 DIAGNOSIS — Z23 Encounter for immunization: Secondary | ICD-10-CM | POA: Diagnosis not present

## 2016-04-30 DIAGNOSIS — N2581 Secondary hyperparathyroidism of renal origin: Secondary | ICD-10-CM | POA: Diagnosis not present

## 2016-04-30 DIAGNOSIS — D631 Anemia in chronic kidney disease: Secondary | ICD-10-CM | POA: Diagnosis not present

## 2016-05-02 DIAGNOSIS — N2581 Secondary hyperparathyroidism of renal origin: Secondary | ICD-10-CM | POA: Diagnosis not present

## 2016-05-02 DIAGNOSIS — Z23 Encounter for immunization: Secondary | ICD-10-CM | POA: Diagnosis not present

## 2016-05-02 DIAGNOSIS — D631 Anemia in chronic kidney disease: Secondary | ICD-10-CM | POA: Diagnosis not present

## 2016-05-02 DIAGNOSIS — N186 End stage renal disease: Secondary | ICD-10-CM | POA: Diagnosis not present

## 2016-05-04 DIAGNOSIS — Z23 Encounter for immunization: Secondary | ICD-10-CM | POA: Diagnosis not present

## 2016-05-04 DIAGNOSIS — N2581 Secondary hyperparathyroidism of renal origin: Secondary | ICD-10-CM | POA: Diagnosis not present

## 2016-05-04 DIAGNOSIS — N186 End stage renal disease: Secondary | ICD-10-CM | POA: Diagnosis not present

## 2016-05-04 DIAGNOSIS — D631 Anemia in chronic kidney disease: Secondary | ICD-10-CM | POA: Diagnosis not present

## 2016-05-07 ENCOUNTER — Encounter: Payer: Self-pay | Admitting: Internal Medicine

## 2016-05-07 ENCOUNTER — Ambulatory Visit (INDEPENDENT_AMBULATORY_CARE_PROVIDER_SITE_OTHER): Payer: Medicare Other | Admitting: Internal Medicine

## 2016-05-07 VITALS — BP 120/64 | HR 99 | Temp 98.5°F | Resp 12 | Ht 64.0 in | Wt 123.0 lb

## 2016-05-07 DIAGNOSIS — M329 Systemic lupus erythematosus, unspecified: Secondary | ICD-10-CM | POA: Diagnosis not present

## 2016-05-07 DIAGNOSIS — N2581 Secondary hyperparathyroidism of renal origin: Secondary | ICD-10-CM | POA: Diagnosis not present

## 2016-05-07 DIAGNOSIS — D631 Anemia in chronic kidney disease: Secondary | ICD-10-CM | POA: Diagnosis not present

## 2016-05-07 DIAGNOSIS — I10 Essential (primary) hypertension: Secondary | ICD-10-CM

## 2016-05-07 DIAGNOSIS — D62 Acute posthemorrhagic anemia: Secondary | ICD-10-CM | POA: Diagnosis not present

## 2016-05-07 DIAGNOSIS — F4323 Adjustment disorder with mixed anxiety and depressed mood: Secondary | ICD-10-CM | POA: Diagnosis not present

## 2016-05-07 DIAGNOSIS — N186 End stage renal disease: Secondary | ICD-10-CM | POA: Diagnosis not present

## 2016-05-07 DIAGNOSIS — Z23 Encounter for immunization: Secondary | ICD-10-CM | POA: Diagnosis not present

## 2016-05-07 DIAGNOSIS — IMO0002 Reserved for concepts with insufficient information to code with codable children: Secondary | ICD-10-CM

## 2016-05-07 MED ORDER — FLUOXETINE HCL 10 MG PO TABS: ORAL_TABLET | ORAL | Status: DC

## 2016-05-07 MED ORDER — PANTOPRAZOLE SODIUM 40 MG PO TBEC: 40.0000 mg | DELAYED_RELEASE_TABLET | Freq: Every day | ORAL | Status: DC

## 2016-05-07 NOTE — Assessment & Plan Note (Signed)
Likely GI bleed from the combo of NSAIDs and prednisone. She is off the NSAIDs and will decrease protonix to daily. Has not followed up with GI and encouraged her to do so. Recent Hg increased per her reports. Will try to get records from her dialysis center.

## 2016-05-07 NOTE — Assessment & Plan Note (Signed)
Continues to take prednisone and no flare recently although she does have a hard time in the summer with heat and sun exposure.

## 2016-05-07 NOTE — Assessment & Plan Note (Signed)
Rx for fluoxetine for her mood to try. Will return in a couple months for follow up. She is encouraged to call with status and let us know if it is helping or dose adjustment if needed.

## 2016-05-07 NOTE — Assessment & Plan Note (Signed)
BP at goal on lasix twice daily.

## 2016-05-07 NOTE — Progress Notes (Signed)
Pre visit review using our clinic review tool, if applicable. No additional management support is needed unless otherwise documented below in the visit note. 

## 2016-05-07 NOTE — Patient Instructions (Signed)
We are going to decrease the protonix to once daily instead of daily.   We have sent in fluoxetine for the anxiety to help. Take 1 pill daily for the first 2 weeks, then increase to taking 2 pills daily.

## 2016-05-07 NOTE — Progress Notes (Signed)
   Subjective:    Patient ID: [name], female    DOB: 1976-05-10, 40 y.o.   MRN: [id number]  HPI The patient is a 40 YO female coming in for follow up of hospitalization for upper GI bleed (still taking protonix BID, no recurrence in bleeding, has not followed up with GI, recent blood draw at dialysis with improving numbers per her report) and also for her mood (took paxil for a couple weeks and did not notice much difference, not taking anything now but would like to try something else. No SI/HI.   PMH, [facility], social history reviewed and updated.   Review of Systems  Constitutional: Positive for fatigue. Negative for fever, activity change and appetite change.  HENT: Negative.   Eyes: Negative.   Respiratory: Negative for cough, chest tightness, shortness of breath and wheezing.   Cardiovascular: Negative for chest pain, palpitations and leg swelling.  Gastrointestinal: Negative for nausea, abdominal pain, diarrhea, constipation and abdominal distention.  Musculoskeletal: Positive for myalgias.       Occasional  Skin: Negative.   Neurological: Negative.   Psychiatric/Behavioral: Positive for dysphoric mood. Negative for suicidal ideas, behavioral problems, sleep disturbance, self-injury and agitation. The patient is nervous/anxious. The patient is not hyperactive.       Objective:   Physical Exam  Constitutional: She is oriented to person, place, and time. She appears well-developed and well-nourished.  HENT:  Head: Normocephalic and atraumatic.  Eyes: EOM are normal.  Neck: Normal range of motion.  Cardiovascular: Normal rate and regular rhythm.   Flow murmur, vascular access with good thrill and bruit left upper arm  Pulmonary/Chest: Effort normal. No respiratory distress. She has no wheezes. She has no rales.  Abdominal: Soft. She exhibits no distension. There is no tenderness. There is no rebound.  Musculoskeletal: She exhibits edema.  Left greater than right leg    Neurological: She is alert and oriented to person, place, and time. Coordination normal.  Skin: Skin is warm and dry.  Psychiatric:  Slightly flat affect   Filed Vitals:   05/07/16 1456  BP: 120/64  Pulse: 99  Temp: 98.5 F (36.9 C)  TempSrc: Oral  Resp: 12  Height: 5\' 4"  (1.626 m)  Weight: 123 lb (55.792 kg)  SpO2: 98%      Assessment & Plan:

## 2016-05-09 DIAGNOSIS — N2581 Secondary hyperparathyroidism of renal origin: Secondary | ICD-10-CM | POA: Diagnosis not present

## 2016-05-09 DIAGNOSIS — D631 Anemia in chronic kidney disease: Secondary | ICD-10-CM | POA: Diagnosis not present

## 2016-05-09 DIAGNOSIS — N186 End stage renal disease: Secondary | ICD-10-CM | POA: Diagnosis not present

## 2016-05-09 DIAGNOSIS — Z23 Encounter for immunization: Secondary | ICD-10-CM | POA: Diagnosis not present

## 2016-05-11 DIAGNOSIS — N2581 Secondary hyperparathyroidism of renal origin: Secondary | ICD-10-CM | POA: Diagnosis not present

## 2016-05-11 DIAGNOSIS — D631 Anemia in chronic kidney disease: Secondary | ICD-10-CM | POA: Diagnosis not present

## 2016-05-11 DIAGNOSIS — N186 End stage renal disease: Secondary | ICD-10-CM | POA: Diagnosis not present

## 2016-05-11 DIAGNOSIS — Z23 Encounter for immunization: Secondary | ICD-10-CM | POA: Diagnosis not present

## 2016-05-14 DIAGNOSIS — D631 Anemia in chronic kidney disease: Secondary | ICD-10-CM | POA: Diagnosis not present

## 2016-05-14 DIAGNOSIS — N2581 Secondary hyperparathyroidism of renal origin: Secondary | ICD-10-CM | POA: Diagnosis not present

## 2016-05-14 DIAGNOSIS — N186 End stage renal disease: Secondary | ICD-10-CM | POA: Diagnosis not present

## 2016-05-14 DIAGNOSIS — Z23 Encounter for immunization: Secondary | ICD-10-CM | POA: Diagnosis not present

## 2016-05-16 DIAGNOSIS — D631 Anemia in chronic kidney disease: Secondary | ICD-10-CM | POA: Diagnosis not present

## 2016-05-16 DIAGNOSIS — Z23 Encounter for immunization: Secondary | ICD-10-CM | POA: Diagnosis not present

## 2016-05-16 DIAGNOSIS — N2581 Secondary hyperparathyroidism of renal origin: Secondary | ICD-10-CM | POA: Diagnosis not present

## 2016-05-16 DIAGNOSIS — N186 End stage renal disease: Secondary | ICD-10-CM | POA: Diagnosis not present

## 2016-05-18 DIAGNOSIS — M3210 Systemic lupus erythematosus, organ or system involvement unspecified: Secondary | ICD-10-CM | POA: Diagnosis not present

## 2016-05-18 DIAGNOSIS — D631 Anemia in chronic kidney disease: Secondary | ICD-10-CM | POA: Diagnosis not present

## 2016-05-18 DIAGNOSIS — Z992 Dependence on renal dialysis: Secondary | ICD-10-CM | POA: Diagnosis not present

## 2016-05-18 DIAGNOSIS — N186 End stage renal disease: Secondary | ICD-10-CM | POA: Diagnosis not present

## 2016-05-18 DIAGNOSIS — N2581 Secondary hyperparathyroidism of renal origin: Secondary | ICD-10-CM | POA: Diagnosis not present

## 2016-05-18 DIAGNOSIS — Z23 Encounter for immunization: Secondary | ICD-10-CM | POA: Diagnosis not present

## 2016-05-21 DIAGNOSIS — D631 Anemia in chronic kidney disease: Secondary | ICD-10-CM | POA: Diagnosis not present

## 2016-05-21 DIAGNOSIS — N186 End stage renal disease: Secondary | ICD-10-CM | POA: Diagnosis not present

## 2016-05-21 DIAGNOSIS — N2581 Secondary hyperparathyroidism of renal origin: Secondary | ICD-10-CM | POA: Diagnosis not present

## 2016-05-23 DIAGNOSIS — D631 Anemia in chronic kidney disease: Secondary | ICD-10-CM | POA: Diagnosis not present

## 2016-05-23 DIAGNOSIS — N186 End stage renal disease: Secondary | ICD-10-CM | POA: Diagnosis not present

## 2016-05-23 DIAGNOSIS — N2581 Secondary hyperparathyroidism of renal origin: Secondary | ICD-10-CM | POA: Diagnosis not present

## 2016-05-25 DIAGNOSIS — D631 Anemia in chronic kidney disease: Secondary | ICD-10-CM | POA: Diagnosis not present

## 2016-05-25 DIAGNOSIS — N2581 Secondary hyperparathyroidism of renal origin: Secondary | ICD-10-CM | POA: Diagnosis not present

## 2016-05-25 DIAGNOSIS — N186 End stage renal disease: Secondary | ICD-10-CM | POA: Diagnosis not present

## 2016-05-28 DIAGNOSIS — D631 Anemia in chronic kidney disease: Secondary | ICD-10-CM | POA: Diagnosis not present

## 2016-05-28 DIAGNOSIS — N186 End stage renal disease: Secondary | ICD-10-CM | POA: Diagnosis not present

## 2016-05-28 DIAGNOSIS — N2581 Secondary hyperparathyroidism of renal origin: Secondary | ICD-10-CM | POA: Diagnosis not present

## 2016-05-30 DIAGNOSIS — N2581 Secondary hyperparathyroidism of renal origin: Secondary | ICD-10-CM | POA: Diagnosis not present

## 2016-05-30 DIAGNOSIS — N186 End stage renal disease: Secondary | ICD-10-CM | POA: Diagnosis not present

## 2016-05-30 DIAGNOSIS — D631 Anemia in chronic kidney disease: Secondary | ICD-10-CM | POA: Diagnosis not present

## 2016-05-31 DIAGNOSIS — Z6821 Body mass index (BMI) 21.0-21.9, adult: Secondary | ICD-10-CM | POA: Diagnosis not present

## 2016-05-31 DIAGNOSIS — Z1231 Encounter for screening mammogram for malignant neoplasm of breast: Secondary | ICD-10-CM | POA: Diagnosis not present

## 2016-05-31 DIAGNOSIS — Z01419 Encounter for gynecological examination (general) (routine) without abnormal findings: Secondary | ICD-10-CM | POA: Diagnosis not present

## 2016-06-01 DIAGNOSIS — N2581 Secondary hyperparathyroidism of renal origin: Secondary | ICD-10-CM | POA: Diagnosis not present

## 2016-06-01 DIAGNOSIS — D631 Anemia in chronic kidney disease: Secondary | ICD-10-CM | POA: Diagnosis not present

## 2016-06-01 DIAGNOSIS — N186 End stage renal disease: Secondary | ICD-10-CM | POA: Diagnosis not present

## 2016-06-04 DIAGNOSIS — N186 End stage renal disease: Secondary | ICD-10-CM | POA: Diagnosis not present

## 2016-06-04 DIAGNOSIS — D631 Anemia in chronic kidney disease: Secondary | ICD-10-CM | POA: Diagnosis not present

## 2016-06-04 DIAGNOSIS — N2581 Secondary hyperparathyroidism of renal origin: Secondary | ICD-10-CM | POA: Diagnosis not present

## 2016-06-06 DIAGNOSIS — N186 End stage renal disease: Secondary | ICD-10-CM | POA: Diagnosis not present

## 2016-06-06 DIAGNOSIS — N2581 Secondary hyperparathyroidism of renal origin: Secondary | ICD-10-CM | POA: Diagnosis not present

## 2016-06-06 DIAGNOSIS — D631 Anemia in chronic kidney disease: Secondary | ICD-10-CM | POA: Diagnosis not present

## 2016-06-08 DIAGNOSIS — N2581 Secondary hyperparathyroidism of renal origin: Secondary | ICD-10-CM | POA: Diagnosis not present

## 2016-06-08 DIAGNOSIS — N186 End stage renal disease: Secondary | ICD-10-CM | POA: Diagnosis not present

## 2016-06-08 DIAGNOSIS — D631 Anemia in chronic kidney disease: Secondary | ICD-10-CM | POA: Diagnosis not present

## 2016-06-11 DIAGNOSIS — N186 End stage renal disease: Secondary | ICD-10-CM | POA: Diagnosis not present

## 2016-06-11 DIAGNOSIS — N2581 Secondary hyperparathyroidism of renal origin: Secondary | ICD-10-CM | POA: Diagnosis not present

## 2016-06-11 DIAGNOSIS — D631 Anemia in chronic kidney disease: Secondary | ICD-10-CM | POA: Diagnosis not present

## 2016-06-13 DIAGNOSIS — N2581 Secondary hyperparathyroidism of renal origin: Secondary | ICD-10-CM | POA: Diagnosis not present

## 2016-06-13 DIAGNOSIS — D631 Anemia in chronic kidney disease: Secondary | ICD-10-CM | POA: Diagnosis not present

## 2016-06-13 DIAGNOSIS — N186 End stage renal disease: Secondary | ICD-10-CM | POA: Diagnosis not present

## 2016-06-15 DIAGNOSIS — N186 End stage renal disease: Secondary | ICD-10-CM | POA: Diagnosis not present

## 2016-06-15 DIAGNOSIS — N2581 Secondary hyperparathyroidism of renal origin: Secondary | ICD-10-CM | POA: Diagnosis not present

## 2016-06-15 DIAGNOSIS — D631 Anemia in chronic kidney disease: Secondary | ICD-10-CM | POA: Diagnosis not present

## 2016-06-18 DIAGNOSIS — M3210 Systemic lupus erythematosus, organ or system involvement unspecified: Secondary | ICD-10-CM | POA: Diagnosis not present

## 2016-06-18 DIAGNOSIS — D631 Anemia in chronic kidney disease: Secondary | ICD-10-CM | POA: Diagnosis not present

## 2016-06-18 DIAGNOSIS — N2581 Secondary hyperparathyroidism of renal origin: Secondary | ICD-10-CM | POA: Diagnosis not present

## 2016-06-18 DIAGNOSIS — N186 End stage renal disease: Secondary | ICD-10-CM | POA: Diagnosis not present

## 2016-06-18 DIAGNOSIS — Z992 Dependence on renal dialysis: Secondary | ICD-10-CM | POA: Diagnosis not present

## 2016-06-20 DIAGNOSIS — N2581 Secondary hyperparathyroidism of renal origin: Secondary | ICD-10-CM | POA: Diagnosis not present

## 2016-06-20 DIAGNOSIS — N186 End stage renal disease: Secondary | ICD-10-CM | POA: Diagnosis not present

## 2016-06-20 DIAGNOSIS — D631 Anemia in chronic kidney disease: Secondary | ICD-10-CM | POA: Diagnosis not present

## 2016-06-22 DIAGNOSIS — N186 End stage renal disease: Secondary | ICD-10-CM | POA: Diagnosis not present

## 2016-06-22 DIAGNOSIS — N2581 Secondary hyperparathyroidism of renal origin: Secondary | ICD-10-CM | POA: Diagnosis not present

## 2016-06-22 DIAGNOSIS — D631 Anemia in chronic kidney disease: Secondary | ICD-10-CM | POA: Diagnosis not present

## 2016-06-25 DIAGNOSIS — N2581 Secondary hyperparathyroidism of renal origin: Secondary | ICD-10-CM | POA: Diagnosis not present

## 2016-06-25 DIAGNOSIS — D631 Anemia in chronic kidney disease: Secondary | ICD-10-CM | POA: Diagnosis not present

## 2016-06-25 DIAGNOSIS — N186 End stage renal disease: Secondary | ICD-10-CM | POA: Diagnosis not present

## 2016-06-27 DIAGNOSIS — N2581 Secondary hyperparathyroidism of renal origin: Secondary | ICD-10-CM | POA: Diagnosis not present

## 2016-06-27 DIAGNOSIS — D631 Anemia in chronic kidney disease: Secondary | ICD-10-CM | POA: Diagnosis not present

## 2016-06-27 DIAGNOSIS — N186 End stage renal disease: Secondary | ICD-10-CM | POA: Diagnosis not present

## 2016-06-29 DIAGNOSIS — N2581 Secondary hyperparathyroidism of renal origin: Secondary | ICD-10-CM | POA: Diagnosis not present

## 2016-06-29 DIAGNOSIS — D631 Anemia in chronic kidney disease: Secondary | ICD-10-CM | POA: Diagnosis not present

## 2016-06-29 DIAGNOSIS — N186 End stage renal disease: Secondary | ICD-10-CM | POA: Diagnosis not present

## 2016-07-02 DIAGNOSIS — N2581 Secondary hyperparathyroidism of renal origin: Secondary | ICD-10-CM | POA: Diagnosis not present

## 2016-07-02 DIAGNOSIS — N186 End stage renal disease: Secondary | ICD-10-CM | POA: Diagnosis not present

## 2016-07-02 DIAGNOSIS — D631 Anemia in chronic kidney disease: Secondary | ICD-10-CM | POA: Diagnosis not present

## 2016-07-04 DIAGNOSIS — N186 End stage renal disease: Secondary | ICD-10-CM | POA: Diagnosis not present

## 2016-07-04 DIAGNOSIS — D631 Anemia in chronic kidney disease: Secondary | ICD-10-CM | POA: Diagnosis not present

## 2016-07-04 DIAGNOSIS — N2581 Secondary hyperparathyroidism of renal origin: Secondary | ICD-10-CM | POA: Diagnosis not present

## 2016-07-05 DIAGNOSIS — N912 Amenorrhea, unspecified: Secondary | ICD-10-CM | POA: Diagnosis not present

## 2016-07-06 DIAGNOSIS — D631 Anemia in chronic kidney disease: Secondary | ICD-10-CM | POA: Diagnosis not present

## 2016-07-06 DIAGNOSIS — N186 End stage renal disease: Secondary | ICD-10-CM | POA: Diagnosis not present

## 2016-07-06 DIAGNOSIS — N2581 Secondary hyperparathyroidism of renal origin: Secondary | ICD-10-CM | POA: Diagnosis not present

## 2016-07-09 DIAGNOSIS — N186 End stage renal disease: Secondary | ICD-10-CM | POA: Diagnosis not present

## 2016-07-09 DIAGNOSIS — D631 Anemia in chronic kidney disease: Secondary | ICD-10-CM | POA: Diagnosis not present

## 2016-07-09 DIAGNOSIS — N2581 Secondary hyperparathyroidism of renal origin: Secondary | ICD-10-CM | POA: Diagnosis not present

## 2016-07-10 ENCOUNTER — Encounter: Payer: Self-pay | Admitting: Internal Medicine

## 2016-07-10 ENCOUNTER — Ambulatory Visit (INDEPENDENT_AMBULATORY_CARE_PROVIDER_SITE_OTHER): Payer: Medicare Other | Admitting: Internal Medicine

## 2016-07-10 DIAGNOSIS — Z23 Encounter for immunization: Secondary | ICD-10-CM | POA: Diagnosis not present

## 2016-07-10 DIAGNOSIS — R21 Rash and other nonspecific skin eruption: Secondary | ICD-10-CM

## 2016-07-10 DIAGNOSIS — K219 Gastro-esophageal reflux disease without esophagitis: Secondary | ICD-10-CM

## 2016-07-10 MED ORDER — PANTOPRAZOLE SODIUM 40 MG PO TBEC: 40.0000 mg | DELAYED_RELEASE_TABLET | Freq: Every day | ORAL | 6 refills | Status: DC

## 2016-07-10 MED ORDER — TRIAMCINOLONE ACETONIDE 0.1 % EX CREA: 1.0000 "application " | TOPICAL_CREAM | Freq: Two times a day (BID) | CUTANEOUS | 0 refills | Status: DC

## 2016-07-10 NOTE — Assessment & Plan Note (Signed)
With history of recent duodenal ulcer, no blood in stools at this time. Rx for protonix for the symptoms. Once symptom free we can try to switch to zantac.

## 2016-07-10 NOTE — Progress Notes (Signed)
   Subjective:    Patient ID: [name], female    DOB: [birth year], 40 y.o.   MRN: [id number]  HPI The patient is a 40 YO female coming in for heart burn for about 4 days now. She has tried some OTC products which did not help much. She has had upper GI bleed in the past. Taking protonix daily before but she is out for some time. She is starting to have the same burning she had before. Denies vomiting blood, dark stools.   Review of Systems  Constitutional: Positive for fatigue. Negative for activity change, appetite change and fever.  Respiratory: Negative for cough, chest tightness, shortness of breath and wheezing.   Cardiovascular: Negative for chest pain, palpitations and leg swelling.  Gastrointestinal: Negative for abdominal distention, abdominal pain, blood in stool, constipation, diarrhea, nausea and vomiting.       GERD  Musculoskeletal: Positive for myalgias.       Occasional  Skin: Positive for color change.       L ankle  Neurological: Negative.       Objective:   Physical Exam  Constitutional: She is oriented to person, place, and time. She appears well-developed and well-nourished.  HENT:  Head: Normocephalic and atraumatic.  Eyes: EOM are normal.  Neck: Normal range of motion.  Cardiovascular: Normal rate and regular rhythm.   Flow murmur, vascular access with good thrill and bruit left upper arm  Pulmonary/Chest: Effort normal. No respiratory distress. She has no wheezes. She has no rales.  Abdominal: Soft. Bowel sounds are normal. She exhibits no distension and no mass. There is no tenderness. There is no rebound and no guarding.  Musculoskeletal: She exhibits edema.  Left greater than right leg, some color change on the L ankle  Neurological: She is alert and oriented to person, place, and time. Coordination normal.  Skin: Skin is warm and dry.  Psychiatric:  Slightly flat affect   Vitals:   07/10/16 0807  BP: (!) 150/82  Pulse: 96  Resp: 14  Temp:  98.7 F (37.1 C)  TempSrc: Oral  SpO2: 99%  Weight: 124 lb (56.2 kg)  Height: 5\' 4"  (1.626 m)      Assessment & Plan:

## 2016-07-10 NOTE — Patient Instructions (Signed)
We have sent in the pantoprazole for the stomach. Take 1 pill daily and we recommend to take before breakfast in the morning.   We have also sent in a cream for the ankle called triamcinolone cream to use twice daily for the next couple of weeks to help the skin.

## 2016-07-10 NOTE — Progress Notes (Signed)
Pre visit review using our clinic review tool, if applicable. No additional management support is needed unless otherwise documented below in the visit note. 

## 2016-07-10 NOTE — Assessment & Plan Note (Signed)
Rx for triamcinolone cream for the rash.

## 2016-07-11 DIAGNOSIS — D631 Anemia in chronic kidney disease: Secondary | ICD-10-CM | POA: Diagnosis not present

## 2016-07-11 DIAGNOSIS — N2581 Secondary hyperparathyroidism of renal origin: Secondary | ICD-10-CM | POA: Diagnosis not present

## 2016-07-11 DIAGNOSIS — N186 End stage renal disease: Secondary | ICD-10-CM | POA: Diagnosis not present

## 2016-07-13 DIAGNOSIS — N2581 Secondary hyperparathyroidism of renal origin: Secondary | ICD-10-CM | POA: Diagnosis not present

## 2016-07-13 DIAGNOSIS — N186 End stage renal disease: Secondary | ICD-10-CM | POA: Diagnosis not present

## 2016-07-13 DIAGNOSIS — D631 Anemia in chronic kidney disease: Secondary | ICD-10-CM | POA: Diagnosis not present

## 2016-07-16 DIAGNOSIS — N2581 Secondary hyperparathyroidism of renal origin: Secondary | ICD-10-CM | POA: Diagnosis not present

## 2016-07-16 DIAGNOSIS — D631 Anemia in chronic kidney disease: Secondary | ICD-10-CM | POA: Diagnosis not present

## 2016-07-16 DIAGNOSIS — N186 End stage renal disease: Secondary | ICD-10-CM | POA: Diagnosis not present

## 2016-07-18 DIAGNOSIS — N186 End stage renal disease: Secondary | ICD-10-CM | POA: Diagnosis not present

## 2016-07-18 DIAGNOSIS — D631 Anemia in chronic kidney disease: Secondary | ICD-10-CM | POA: Diagnosis not present

## 2016-07-18 DIAGNOSIS — N2581 Secondary hyperparathyroidism of renal origin: Secondary | ICD-10-CM | POA: Diagnosis not present

## 2016-07-19 DIAGNOSIS — Z992 Dependence on renal dialysis: Secondary | ICD-10-CM | POA: Diagnosis not present

## 2016-07-19 DIAGNOSIS — M3210 Systemic lupus erythematosus, organ or system involvement unspecified: Secondary | ICD-10-CM | POA: Diagnosis not present

## 2016-07-19 DIAGNOSIS — N186 End stage renal disease: Secondary | ICD-10-CM | POA: Diagnosis not present

## 2016-07-20 DIAGNOSIS — N2581 Secondary hyperparathyroidism of renal origin: Secondary | ICD-10-CM | POA: Diagnosis not present

## 2016-07-20 DIAGNOSIS — D631 Anemia in chronic kidney disease: Secondary | ICD-10-CM | POA: Diagnosis not present

## 2016-07-20 DIAGNOSIS — N186 End stage renal disease: Secondary | ICD-10-CM | POA: Diagnosis not present

## 2016-07-23 DIAGNOSIS — N186 End stage renal disease: Secondary | ICD-10-CM | POA: Diagnosis not present

## 2016-07-23 DIAGNOSIS — D631 Anemia in chronic kidney disease: Secondary | ICD-10-CM | POA: Diagnosis not present

## 2016-07-23 DIAGNOSIS — N2581 Secondary hyperparathyroidism of renal origin: Secondary | ICD-10-CM | POA: Diagnosis not present

## 2016-07-25 DIAGNOSIS — N2581 Secondary hyperparathyroidism of renal origin: Secondary | ICD-10-CM | POA: Diagnosis not present

## 2016-07-25 DIAGNOSIS — D631 Anemia in chronic kidney disease: Secondary | ICD-10-CM | POA: Diagnosis not present

## 2016-07-25 DIAGNOSIS — N186 End stage renal disease: Secondary | ICD-10-CM | POA: Diagnosis not present

## 2016-07-27 DIAGNOSIS — N2581 Secondary hyperparathyroidism of renal origin: Secondary | ICD-10-CM | POA: Diagnosis not present

## 2016-07-27 DIAGNOSIS — N186 End stage renal disease: Secondary | ICD-10-CM | POA: Diagnosis not present

## 2016-07-27 DIAGNOSIS — D631 Anemia in chronic kidney disease: Secondary | ICD-10-CM | POA: Diagnosis not present

## 2016-07-30 DIAGNOSIS — N186 End stage renal disease: Secondary | ICD-10-CM | POA: Diagnosis not present

## 2016-07-30 DIAGNOSIS — D631 Anemia in chronic kidney disease: Secondary | ICD-10-CM | POA: Diagnosis not present

## 2016-07-30 DIAGNOSIS — N2581 Secondary hyperparathyroidism of renal origin: Secondary | ICD-10-CM | POA: Diagnosis not present

## 2016-08-01 DIAGNOSIS — N2581 Secondary hyperparathyroidism of renal origin: Secondary | ICD-10-CM | POA: Diagnosis not present

## 2016-08-01 DIAGNOSIS — D631 Anemia in chronic kidney disease: Secondary | ICD-10-CM | POA: Diagnosis not present

## 2016-08-01 DIAGNOSIS — N186 End stage renal disease: Secondary | ICD-10-CM | POA: Diagnosis not present

## 2016-08-03 DIAGNOSIS — N186 End stage renal disease: Secondary | ICD-10-CM | POA: Diagnosis not present

## 2016-08-03 DIAGNOSIS — N2581 Secondary hyperparathyroidism of renal origin: Secondary | ICD-10-CM | POA: Diagnosis not present

## 2016-08-03 DIAGNOSIS — D631 Anemia in chronic kidney disease: Secondary | ICD-10-CM | POA: Diagnosis not present

## 2016-08-06 DIAGNOSIS — D631 Anemia in chronic kidney disease: Secondary | ICD-10-CM | POA: Diagnosis not present

## 2016-08-06 DIAGNOSIS — N2581 Secondary hyperparathyroidism of renal origin: Secondary | ICD-10-CM | POA: Diagnosis not present

## 2016-08-06 DIAGNOSIS — N186 End stage renal disease: Secondary | ICD-10-CM | POA: Diagnosis not present

## 2016-08-07 ENCOUNTER — Ambulatory Visit: Payer: Self-pay | Admitting: Internal Medicine

## 2016-08-08 DIAGNOSIS — D631 Anemia in chronic kidney disease: Secondary | ICD-10-CM | POA: Diagnosis not present

## 2016-08-08 DIAGNOSIS — N2581 Secondary hyperparathyroidism of renal origin: Secondary | ICD-10-CM | POA: Diagnosis not present

## 2016-08-08 DIAGNOSIS — N186 End stage renal disease: Secondary | ICD-10-CM | POA: Diagnosis not present

## 2016-08-09 DIAGNOSIS — T82858D Stenosis of vascular prosthetic devices, implants and grafts, subsequent encounter: Secondary | ICD-10-CM | POA: Diagnosis not present

## 2016-08-09 DIAGNOSIS — N186 End stage renal disease: Secondary | ICD-10-CM | POA: Diagnosis not present

## 2016-08-09 DIAGNOSIS — I871 Compression of vein: Secondary | ICD-10-CM | POA: Diagnosis not present

## 2016-08-09 DIAGNOSIS — Z992 Dependence on renal dialysis: Secondary | ICD-10-CM | POA: Diagnosis not present

## 2016-08-10 DIAGNOSIS — N186 End stage renal disease: Secondary | ICD-10-CM | POA: Diagnosis not present

## 2016-08-10 DIAGNOSIS — N2581 Secondary hyperparathyroidism of renal origin: Secondary | ICD-10-CM | POA: Diagnosis not present

## 2016-08-10 DIAGNOSIS — D631 Anemia in chronic kidney disease: Secondary | ICD-10-CM | POA: Diagnosis not present

## 2016-08-13 DIAGNOSIS — N186 End stage renal disease: Secondary | ICD-10-CM | POA: Diagnosis not present

## 2016-08-13 DIAGNOSIS — N2581 Secondary hyperparathyroidism of renal origin: Secondary | ICD-10-CM | POA: Diagnosis not present

## 2016-08-13 DIAGNOSIS — D631 Anemia in chronic kidney disease: Secondary | ICD-10-CM | POA: Diagnosis not present

## 2016-08-15 DIAGNOSIS — N186 End stage renal disease: Secondary | ICD-10-CM | POA: Diagnosis not present

## 2016-08-15 DIAGNOSIS — N2581 Secondary hyperparathyroidism of renal origin: Secondary | ICD-10-CM | POA: Diagnosis not present

## 2016-08-15 DIAGNOSIS — D631 Anemia in chronic kidney disease: Secondary | ICD-10-CM | POA: Diagnosis not present

## 2016-08-17 DIAGNOSIS — N2581 Secondary hyperparathyroidism of renal origin: Secondary | ICD-10-CM | POA: Diagnosis not present

## 2016-08-17 DIAGNOSIS — D631 Anemia in chronic kidney disease: Secondary | ICD-10-CM | POA: Diagnosis not present

## 2016-08-17 DIAGNOSIS — N186 End stage renal disease: Secondary | ICD-10-CM | POA: Diagnosis not present

## 2016-08-18 DIAGNOSIS — M3210 Systemic lupus erythematosus, organ or system involvement unspecified: Secondary | ICD-10-CM | POA: Diagnosis not present

## 2016-08-18 DIAGNOSIS — Z992 Dependence on renal dialysis: Secondary | ICD-10-CM | POA: Diagnosis not present

## 2016-08-18 DIAGNOSIS — N186 End stage renal disease: Secondary | ICD-10-CM | POA: Diagnosis not present

## 2016-08-20 DIAGNOSIS — N2581 Secondary hyperparathyroidism of renal origin: Secondary | ICD-10-CM | POA: Diagnosis not present

## 2016-08-20 DIAGNOSIS — N186 End stage renal disease: Secondary | ICD-10-CM | POA: Diagnosis not present

## 2016-08-22 DIAGNOSIS — N186 End stage renal disease: Secondary | ICD-10-CM | POA: Diagnosis not present

## 2016-08-22 DIAGNOSIS — N2581 Secondary hyperparathyroidism of renal origin: Secondary | ICD-10-CM | POA: Diagnosis not present

## 2016-08-24 DIAGNOSIS — N186 End stage renal disease: Secondary | ICD-10-CM | POA: Diagnosis not present

## 2016-08-24 DIAGNOSIS — N2581 Secondary hyperparathyroidism of renal origin: Secondary | ICD-10-CM | POA: Diagnosis not present

## 2016-08-27 DIAGNOSIS — N186 End stage renal disease: Secondary | ICD-10-CM | POA: Diagnosis not present

## 2016-08-27 DIAGNOSIS — N2581 Secondary hyperparathyroidism of renal origin: Secondary | ICD-10-CM | POA: Diagnosis not present

## 2016-08-29 DIAGNOSIS — N186 End stage renal disease: Secondary | ICD-10-CM | POA: Diagnosis not present

## 2016-08-29 DIAGNOSIS — N2581 Secondary hyperparathyroidism of renal origin: Secondary | ICD-10-CM | POA: Diagnosis not present

## 2016-08-31 DIAGNOSIS — N2581 Secondary hyperparathyroidism of renal origin: Secondary | ICD-10-CM | POA: Diagnosis not present

## 2016-08-31 DIAGNOSIS — N186 End stage renal disease: Secondary | ICD-10-CM | POA: Diagnosis not present

## 2016-09-03 DIAGNOSIS — N186 End stage renal disease: Secondary | ICD-10-CM | POA: Diagnosis not present

## 2016-09-03 DIAGNOSIS — N2581 Secondary hyperparathyroidism of renal origin: Secondary | ICD-10-CM | POA: Diagnosis not present

## 2016-09-05 DIAGNOSIS — N186 End stage renal disease: Secondary | ICD-10-CM | POA: Diagnosis not present

## 2016-09-05 DIAGNOSIS — N2581 Secondary hyperparathyroidism of renal origin: Secondary | ICD-10-CM | POA: Diagnosis not present

## 2016-09-07 DIAGNOSIS — N2581 Secondary hyperparathyroidism of renal origin: Secondary | ICD-10-CM | POA: Diagnosis not present

## 2016-09-07 DIAGNOSIS — N186 End stage renal disease: Secondary | ICD-10-CM | POA: Diagnosis not present

## 2016-09-10 DIAGNOSIS — N2581 Secondary hyperparathyroidism of renal origin: Secondary | ICD-10-CM | POA: Diagnosis not present

## 2016-09-10 DIAGNOSIS — N186 End stage renal disease: Secondary | ICD-10-CM | POA: Diagnosis not present

## 2016-09-12 DIAGNOSIS — N2581 Secondary hyperparathyroidism of renal origin: Secondary | ICD-10-CM | POA: Diagnosis not present

## 2016-09-12 DIAGNOSIS — N186 End stage renal disease: Secondary | ICD-10-CM | POA: Diagnosis not present

## 2016-09-14 DIAGNOSIS — N186 End stage renal disease: Secondary | ICD-10-CM | POA: Diagnosis not present

## 2016-09-14 DIAGNOSIS — N2581 Secondary hyperparathyroidism of renal origin: Secondary | ICD-10-CM | POA: Diagnosis not present

## 2016-09-17 DIAGNOSIS — N186 End stage renal disease: Secondary | ICD-10-CM | POA: Diagnosis not present

## 2016-09-17 DIAGNOSIS — N2581 Secondary hyperparathyroidism of renal origin: Secondary | ICD-10-CM | POA: Diagnosis not present

## 2016-09-18 DIAGNOSIS — M3210 Systemic lupus erythematosus, organ or system involvement unspecified: Secondary | ICD-10-CM | POA: Diagnosis not present

## 2016-09-18 DIAGNOSIS — Z992 Dependence on renal dialysis: Secondary | ICD-10-CM | POA: Diagnosis not present

## 2016-09-18 DIAGNOSIS — N186 End stage renal disease: Secondary | ICD-10-CM | POA: Diagnosis not present

## 2016-09-19 DIAGNOSIS — N2581 Secondary hyperparathyroidism of renal origin: Secondary | ICD-10-CM | POA: Diagnosis not present

## 2016-09-19 DIAGNOSIS — N186 End stage renal disease: Secondary | ICD-10-CM | POA: Diagnosis not present

## 2016-09-19 DIAGNOSIS — D631 Anemia in chronic kidney disease: Secondary | ICD-10-CM | POA: Diagnosis not present

## 2016-09-21 DIAGNOSIS — N2581 Secondary hyperparathyroidism of renal origin: Secondary | ICD-10-CM | POA: Diagnosis not present

## 2016-09-21 DIAGNOSIS — D631 Anemia in chronic kidney disease: Secondary | ICD-10-CM | POA: Diagnosis not present

## 2016-09-21 DIAGNOSIS — N186 End stage renal disease: Secondary | ICD-10-CM | POA: Diagnosis not present

## 2016-09-24 DIAGNOSIS — N186 End stage renal disease: Secondary | ICD-10-CM | POA: Diagnosis not present

## 2016-09-24 DIAGNOSIS — N2581 Secondary hyperparathyroidism of renal origin: Secondary | ICD-10-CM | POA: Diagnosis not present

## 2016-09-24 DIAGNOSIS — D631 Anemia in chronic kidney disease: Secondary | ICD-10-CM | POA: Diagnosis not present

## 2016-09-26 DIAGNOSIS — D631 Anemia in chronic kidney disease: Secondary | ICD-10-CM | POA: Diagnosis not present

## 2016-09-26 DIAGNOSIS — N186 End stage renal disease: Secondary | ICD-10-CM | POA: Diagnosis not present

## 2016-09-26 DIAGNOSIS — N2581 Secondary hyperparathyroidism of renal origin: Secondary | ICD-10-CM | POA: Diagnosis not present

## 2016-09-28 DIAGNOSIS — D631 Anemia in chronic kidney disease: Secondary | ICD-10-CM | POA: Diagnosis not present

## 2016-09-28 DIAGNOSIS — N186 End stage renal disease: Secondary | ICD-10-CM | POA: Diagnosis not present

## 2016-09-28 DIAGNOSIS — N2581 Secondary hyperparathyroidism of renal origin: Secondary | ICD-10-CM | POA: Diagnosis not present

## 2016-10-01 DIAGNOSIS — N186 End stage renal disease: Secondary | ICD-10-CM | POA: Diagnosis not present

## 2016-10-01 DIAGNOSIS — D631 Anemia in chronic kidney disease: Secondary | ICD-10-CM | POA: Diagnosis not present

## 2016-10-01 DIAGNOSIS — N2581 Secondary hyperparathyroidism of renal origin: Secondary | ICD-10-CM | POA: Diagnosis not present

## 2016-10-03 DIAGNOSIS — N2581 Secondary hyperparathyroidism of renal origin: Secondary | ICD-10-CM | POA: Diagnosis not present

## 2016-10-03 DIAGNOSIS — D631 Anemia in chronic kidney disease: Secondary | ICD-10-CM | POA: Diagnosis not present

## 2016-10-03 DIAGNOSIS — N186 End stage renal disease: Secondary | ICD-10-CM | POA: Diagnosis not present

## 2016-10-05 DIAGNOSIS — D631 Anemia in chronic kidney disease: Secondary | ICD-10-CM | POA: Diagnosis not present

## 2016-10-05 DIAGNOSIS — N186 End stage renal disease: Secondary | ICD-10-CM | POA: Diagnosis not present

## 2016-10-05 DIAGNOSIS — N2581 Secondary hyperparathyroidism of renal origin: Secondary | ICD-10-CM | POA: Diagnosis not present

## 2016-10-07 DIAGNOSIS — N2581 Secondary hyperparathyroidism of renal origin: Secondary | ICD-10-CM | POA: Diagnosis not present

## 2016-10-07 DIAGNOSIS — D631 Anemia in chronic kidney disease: Secondary | ICD-10-CM | POA: Diagnosis not present

## 2016-10-07 DIAGNOSIS — N186 End stage renal disease: Secondary | ICD-10-CM | POA: Diagnosis not present

## 2016-10-09 DIAGNOSIS — N186 End stage renal disease: Secondary | ICD-10-CM | POA: Diagnosis not present

## 2016-10-09 DIAGNOSIS — D631 Anemia in chronic kidney disease: Secondary | ICD-10-CM | POA: Diagnosis not present

## 2016-10-09 DIAGNOSIS — N2581 Secondary hyperparathyroidism of renal origin: Secondary | ICD-10-CM | POA: Diagnosis not present

## 2016-10-12 DIAGNOSIS — N186 End stage renal disease: Secondary | ICD-10-CM | POA: Diagnosis not present

## 2016-10-12 DIAGNOSIS — N2581 Secondary hyperparathyroidism of renal origin: Secondary | ICD-10-CM | POA: Diagnosis not present

## 2016-10-12 DIAGNOSIS — D631 Anemia in chronic kidney disease: Secondary | ICD-10-CM | POA: Diagnosis not present

## 2016-10-15 DIAGNOSIS — D631 Anemia in chronic kidney disease: Secondary | ICD-10-CM | POA: Diagnosis not present

## 2016-10-15 DIAGNOSIS — N186 End stage renal disease: Secondary | ICD-10-CM | POA: Diagnosis not present

## 2016-10-15 DIAGNOSIS — N2581 Secondary hyperparathyroidism of renal origin: Secondary | ICD-10-CM | POA: Diagnosis not present

## 2016-10-17 DIAGNOSIS — N2581 Secondary hyperparathyroidism of renal origin: Secondary | ICD-10-CM | POA: Diagnosis not present

## 2016-10-17 DIAGNOSIS — N186 End stage renal disease: Secondary | ICD-10-CM | POA: Diagnosis not present

## 2016-10-17 DIAGNOSIS — D631 Anemia in chronic kidney disease: Secondary | ICD-10-CM | POA: Diagnosis not present

## 2016-10-18 DIAGNOSIS — Z992 Dependence on renal dialysis: Secondary | ICD-10-CM | POA: Diagnosis not present

## 2016-10-18 DIAGNOSIS — N186 End stage renal disease: Secondary | ICD-10-CM | POA: Diagnosis not present

## 2016-10-18 DIAGNOSIS — M3210 Systemic lupus erythematosus, organ or system involvement unspecified: Secondary | ICD-10-CM | POA: Diagnosis not present

## 2016-10-19 DIAGNOSIS — D631 Anemia in chronic kidney disease: Secondary | ICD-10-CM | POA: Diagnosis not present

## 2016-10-19 DIAGNOSIS — N186 End stage renal disease: Secondary | ICD-10-CM | POA: Diagnosis not present

## 2016-10-22 DIAGNOSIS — N186 End stage renal disease: Secondary | ICD-10-CM | POA: Diagnosis not present

## 2016-10-22 DIAGNOSIS — D631 Anemia in chronic kidney disease: Secondary | ICD-10-CM | POA: Diagnosis not present

## 2016-10-24 DIAGNOSIS — D631 Anemia in chronic kidney disease: Secondary | ICD-10-CM | POA: Diagnosis not present

## 2016-10-24 DIAGNOSIS — N186 End stage renal disease: Secondary | ICD-10-CM | POA: Diagnosis not present

## 2016-10-26 DIAGNOSIS — N186 End stage renal disease: Secondary | ICD-10-CM | POA: Diagnosis not present

## 2016-10-26 DIAGNOSIS — D631 Anemia in chronic kidney disease: Secondary | ICD-10-CM | POA: Diagnosis not present

## 2016-10-29 DIAGNOSIS — D631 Anemia in chronic kidney disease: Secondary | ICD-10-CM | POA: Diagnosis not present

## 2016-10-29 DIAGNOSIS — N186 End stage renal disease: Secondary | ICD-10-CM | POA: Diagnosis not present

## 2016-10-31 DIAGNOSIS — N186 End stage renal disease: Secondary | ICD-10-CM | POA: Diagnosis not present

## 2016-10-31 DIAGNOSIS — D631 Anemia in chronic kidney disease: Secondary | ICD-10-CM | POA: Diagnosis not present

## 2016-11-02 DIAGNOSIS — D631 Anemia in chronic kidney disease: Secondary | ICD-10-CM | POA: Diagnosis not present

## 2016-11-02 DIAGNOSIS — N186 End stage renal disease: Secondary | ICD-10-CM | POA: Diagnosis not present

## 2016-11-05 DIAGNOSIS — N186 End stage renal disease: Secondary | ICD-10-CM | POA: Diagnosis not present

## 2016-11-05 DIAGNOSIS — D631 Anemia in chronic kidney disease: Secondary | ICD-10-CM | POA: Diagnosis not present

## 2016-11-07 DIAGNOSIS — D631 Anemia in chronic kidney disease: Secondary | ICD-10-CM | POA: Diagnosis not present

## 2016-11-07 DIAGNOSIS — N186 End stage renal disease: Secondary | ICD-10-CM | POA: Diagnosis not present

## 2016-11-09 DIAGNOSIS — N186 End stage renal disease: Secondary | ICD-10-CM | POA: Diagnosis not present

## 2016-11-09 DIAGNOSIS — D631 Anemia in chronic kidney disease: Secondary | ICD-10-CM | POA: Diagnosis not present

## 2016-11-11 DIAGNOSIS — D631 Anemia in chronic kidney disease: Secondary | ICD-10-CM | POA: Diagnosis not present

## 2016-11-11 DIAGNOSIS — N186 End stage renal disease: Secondary | ICD-10-CM | POA: Diagnosis not present

## 2016-11-14 DIAGNOSIS — N186 End stage renal disease: Secondary | ICD-10-CM | POA: Diagnosis not present

## 2016-11-14 DIAGNOSIS — D631 Anemia in chronic kidney disease: Secondary | ICD-10-CM | POA: Diagnosis not present

## 2016-11-16 DIAGNOSIS — D631 Anemia in chronic kidney disease: Secondary | ICD-10-CM | POA: Diagnosis not present

## 2016-11-16 DIAGNOSIS — N186 End stage renal disease: Secondary | ICD-10-CM | POA: Diagnosis not present

## 2016-11-18 DIAGNOSIS — M3210 Systemic lupus erythematosus, organ or system involvement unspecified: Secondary | ICD-10-CM | POA: Diagnosis not present

## 2016-11-18 DIAGNOSIS — N186 End stage renal disease: Secondary | ICD-10-CM | POA: Diagnosis not present

## 2016-11-18 DIAGNOSIS — D631 Anemia in chronic kidney disease: Secondary | ICD-10-CM | POA: Diagnosis not present

## 2016-11-18 DIAGNOSIS — Z992 Dependence on renal dialysis: Secondary | ICD-10-CM | POA: Diagnosis not present

## 2016-11-21 DIAGNOSIS — D509 Iron deficiency anemia, unspecified: Secondary | ICD-10-CM | POA: Diagnosis not present

## 2016-11-21 DIAGNOSIS — N186 End stage renal disease: Secondary | ICD-10-CM | POA: Diagnosis not present

## 2016-11-21 DIAGNOSIS — R7881 Bacteremia: Secondary | ICD-10-CM | POA: Diagnosis not present

## 2016-11-21 DIAGNOSIS — N2581 Secondary hyperparathyroidism of renal origin: Secondary | ICD-10-CM | POA: Diagnosis not present

## 2016-11-21 DIAGNOSIS — D631 Anemia in chronic kidney disease: Secondary | ICD-10-CM | POA: Diagnosis not present

## 2016-11-23 DIAGNOSIS — R7881 Bacteremia: Secondary | ICD-10-CM | POA: Diagnosis not present

## 2016-11-23 DIAGNOSIS — N2581 Secondary hyperparathyroidism of renal origin: Secondary | ICD-10-CM | POA: Diagnosis not present

## 2016-11-23 DIAGNOSIS — D509 Iron deficiency anemia, unspecified: Secondary | ICD-10-CM | POA: Diagnosis not present

## 2016-11-23 DIAGNOSIS — N186 End stage renal disease: Secondary | ICD-10-CM | POA: Diagnosis not present

## 2016-11-23 DIAGNOSIS — D631 Anemia in chronic kidney disease: Secondary | ICD-10-CM | POA: Diagnosis not present

## 2016-11-26 DIAGNOSIS — R7881 Bacteremia: Secondary | ICD-10-CM | POA: Diagnosis not present

## 2016-11-26 DIAGNOSIS — D509 Iron deficiency anemia, unspecified: Secondary | ICD-10-CM | POA: Diagnosis not present

## 2016-11-26 DIAGNOSIS — N186 End stage renal disease: Secondary | ICD-10-CM | POA: Diagnosis not present

## 2016-11-26 DIAGNOSIS — N2581 Secondary hyperparathyroidism of renal origin: Secondary | ICD-10-CM | POA: Diagnosis not present

## 2016-11-26 DIAGNOSIS — D631 Anemia in chronic kidney disease: Secondary | ICD-10-CM | POA: Diagnosis not present

## 2016-11-28 DIAGNOSIS — D631 Anemia in chronic kidney disease: Secondary | ICD-10-CM | POA: Diagnosis not present

## 2016-11-28 DIAGNOSIS — N186 End stage renal disease: Secondary | ICD-10-CM | POA: Diagnosis not present

## 2016-11-28 DIAGNOSIS — D509 Iron deficiency anemia, unspecified: Secondary | ICD-10-CM | POA: Diagnosis not present

## 2016-11-28 DIAGNOSIS — R7881 Bacteremia: Secondary | ICD-10-CM | POA: Diagnosis not present

## 2016-11-28 DIAGNOSIS — N2581 Secondary hyperparathyroidism of renal origin: Secondary | ICD-10-CM | POA: Diagnosis not present

## 2016-11-30 DIAGNOSIS — D631 Anemia in chronic kidney disease: Secondary | ICD-10-CM | POA: Diagnosis not present

## 2016-11-30 DIAGNOSIS — N186 End stage renal disease: Secondary | ICD-10-CM | POA: Diagnosis not present

## 2016-11-30 DIAGNOSIS — N2581 Secondary hyperparathyroidism of renal origin: Secondary | ICD-10-CM | POA: Diagnosis not present

## 2016-11-30 DIAGNOSIS — R7881 Bacteremia: Secondary | ICD-10-CM | POA: Diagnosis not present

## 2016-11-30 DIAGNOSIS — D509 Iron deficiency anemia, unspecified: Secondary | ICD-10-CM | POA: Diagnosis not present

## 2016-12-03 DIAGNOSIS — R7881 Bacteremia: Secondary | ICD-10-CM | POA: Diagnosis not present

## 2016-12-03 DIAGNOSIS — D631 Anemia in chronic kidney disease: Secondary | ICD-10-CM | POA: Diagnosis not present

## 2016-12-03 DIAGNOSIS — N2581 Secondary hyperparathyroidism of renal origin: Secondary | ICD-10-CM | POA: Diagnosis not present

## 2016-12-03 DIAGNOSIS — D509 Iron deficiency anemia, unspecified: Secondary | ICD-10-CM | POA: Diagnosis not present

## 2016-12-03 DIAGNOSIS — N186 End stage renal disease: Secondary | ICD-10-CM | POA: Diagnosis not present

## 2016-12-05 DIAGNOSIS — R7881 Bacteremia: Secondary | ICD-10-CM | POA: Diagnosis not present

## 2016-12-05 DIAGNOSIS — N2581 Secondary hyperparathyroidism of renal origin: Secondary | ICD-10-CM | POA: Diagnosis not present

## 2016-12-05 DIAGNOSIS — N186 End stage renal disease: Secondary | ICD-10-CM | POA: Diagnosis not present

## 2016-12-05 DIAGNOSIS — D631 Anemia in chronic kidney disease: Secondary | ICD-10-CM | POA: Diagnosis not present

## 2016-12-05 DIAGNOSIS — D509 Iron deficiency anemia, unspecified: Secondary | ICD-10-CM | POA: Diagnosis not present

## 2016-12-07 DIAGNOSIS — D631 Anemia in chronic kidney disease: Secondary | ICD-10-CM | POA: Diagnosis not present

## 2016-12-07 DIAGNOSIS — N186 End stage renal disease: Secondary | ICD-10-CM | POA: Diagnosis not present

## 2016-12-07 DIAGNOSIS — R7881 Bacteremia: Secondary | ICD-10-CM | POA: Diagnosis not present

## 2016-12-07 DIAGNOSIS — N2581 Secondary hyperparathyroidism of renal origin: Secondary | ICD-10-CM | POA: Diagnosis not present

## 2016-12-07 DIAGNOSIS — D509 Iron deficiency anemia, unspecified: Secondary | ICD-10-CM | POA: Diagnosis not present

## 2016-12-10 DIAGNOSIS — N2581 Secondary hyperparathyroidism of renal origin: Secondary | ICD-10-CM | POA: Diagnosis not present

## 2016-12-10 DIAGNOSIS — D509 Iron deficiency anemia, unspecified: Secondary | ICD-10-CM | POA: Diagnosis not present

## 2016-12-10 DIAGNOSIS — D631 Anemia in chronic kidney disease: Secondary | ICD-10-CM | POA: Diagnosis not present

## 2016-12-10 DIAGNOSIS — R7881 Bacteremia: Secondary | ICD-10-CM | POA: Diagnosis not present

## 2016-12-10 DIAGNOSIS — N186 End stage renal disease: Secondary | ICD-10-CM | POA: Diagnosis not present

## 2016-12-12 DIAGNOSIS — N2581 Secondary hyperparathyroidism of renal origin: Secondary | ICD-10-CM | POA: Diagnosis not present

## 2016-12-12 DIAGNOSIS — D509 Iron deficiency anemia, unspecified: Secondary | ICD-10-CM | POA: Diagnosis not present

## 2016-12-12 DIAGNOSIS — N186 End stage renal disease: Secondary | ICD-10-CM | POA: Diagnosis not present

## 2016-12-12 DIAGNOSIS — D631 Anemia in chronic kidney disease: Secondary | ICD-10-CM | POA: Diagnosis not present

## 2016-12-12 DIAGNOSIS — R7881 Bacteremia: Secondary | ICD-10-CM | POA: Diagnosis not present

## 2016-12-14 DIAGNOSIS — D631 Anemia in chronic kidney disease: Secondary | ICD-10-CM | POA: Diagnosis not present

## 2016-12-14 DIAGNOSIS — R7881 Bacteremia: Secondary | ICD-10-CM | POA: Diagnosis not present

## 2016-12-14 DIAGNOSIS — N2581 Secondary hyperparathyroidism of renal origin: Secondary | ICD-10-CM | POA: Diagnosis not present

## 2016-12-14 DIAGNOSIS — N186 End stage renal disease: Secondary | ICD-10-CM | POA: Diagnosis not present

## 2016-12-14 DIAGNOSIS — D509 Iron deficiency anemia, unspecified: Secondary | ICD-10-CM | POA: Diagnosis not present

## 2016-12-17 DIAGNOSIS — D509 Iron deficiency anemia, unspecified: Secondary | ICD-10-CM | POA: Diagnosis not present

## 2016-12-17 DIAGNOSIS — D631 Anemia in chronic kidney disease: Secondary | ICD-10-CM | POA: Diagnosis not present

## 2016-12-17 DIAGNOSIS — R7881 Bacteremia: Secondary | ICD-10-CM | POA: Diagnosis not present

## 2016-12-17 DIAGNOSIS — N2581 Secondary hyperparathyroidism of renal origin: Secondary | ICD-10-CM | POA: Diagnosis not present

## 2016-12-17 DIAGNOSIS — N186 End stage renal disease: Secondary | ICD-10-CM | POA: Diagnosis not present

## 2016-12-19 DIAGNOSIS — Z992 Dependence on renal dialysis: Secondary | ICD-10-CM | POA: Diagnosis not present

## 2016-12-19 DIAGNOSIS — N2581 Secondary hyperparathyroidism of renal origin: Secondary | ICD-10-CM | POA: Diagnosis not present

## 2016-12-19 DIAGNOSIS — D509 Iron deficiency anemia, unspecified: Secondary | ICD-10-CM | POA: Diagnosis not present

## 2016-12-19 DIAGNOSIS — R7881 Bacteremia: Secondary | ICD-10-CM | POA: Diagnosis not present

## 2016-12-19 DIAGNOSIS — M3210 Systemic lupus erythematosus, organ or system involvement unspecified: Secondary | ICD-10-CM | POA: Diagnosis not present

## 2016-12-19 DIAGNOSIS — N186 End stage renal disease: Secondary | ICD-10-CM | POA: Diagnosis not present

## 2016-12-19 DIAGNOSIS — D631 Anemia in chronic kidney disease: Secondary | ICD-10-CM | POA: Diagnosis not present

## 2016-12-21 DIAGNOSIS — N186 End stage renal disease: Secondary | ICD-10-CM | POA: Diagnosis not present

## 2016-12-21 DIAGNOSIS — D631 Anemia in chronic kidney disease: Secondary | ICD-10-CM | POA: Diagnosis not present

## 2016-12-21 DIAGNOSIS — N2581 Secondary hyperparathyroidism of renal origin: Secondary | ICD-10-CM | POA: Diagnosis not present

## 2016-12-24 DIAGNOSIS — D631 Anemia in chronic kidney disease: Secondary | ICD-10-CM | POA: Diagnosis not present

## 2016-12-24 DIAGNOSIS — N2581 Secondary hyperparathyroidism of renal origin: Secondary | ICD-10-CM | POA: Diagnosis not present

## 2016-12-24 DIAGNOSIS — N186 End stage renal disease: Secondary | ICD-10-CM | POA: Diagnosis not present

## 2016-12-26 DIAGNOSIS — N186 End stage renal disease: Secondary | ICD-10-CM | POA: Diagnosis not present

## 2016-12-26 DIAGNOSIS — N2581 Secondary hyperparathyroidism of renal origin: Secondary | ICD-10-CM | POA: Diagnosis not present

## 2016-12-26 DIAGNOSIS — D631 Anemia in chronic kidney disease: Secondary | ICD-10-CM | POA: Diagnosis not present

## 2016-12-28 DIAGNOSIS — N186 End stage renal disease: Secondary | ICD-10-CM | POA: Diagnosis not present

## 2016-12-28 DIAGNOSIS — N2581 Secondary hyperparathyroidism of renal origin: Secondary | ICD-10-CM | POA: Diagnosis not present

## 2016-12-28 DIAGNOSIS — D631 Anemia in chronic kidney disease: Secondary | ICD-10-CM | POA: Diagnosis not present

## 2016-12-31 DIAGNOSIS — D631 Anemia in chronic kidney disease: Secondary | ICD-10-CM | POA: Diagnosis not present

## 2016-12-31 DIAGNOSIS — N2581 Secondary hyperparathyroidism of renal origin: Secondary | ICD-10-CM | POA: Diagnosis not present

## 2016-12-31 DIAGNOSIS — N186 End stage renal disease: Secondary | ICD-10-CM | POA: Diagnosis not present

## 2017-01-02 DIAGNOSIS — N186 End stage renal disease: Secondary | ICD-10-CM | POA: Diagnosis not present

## 2017-01-02 DIAGNOSIS — N2581 Secondary hyperparathyroidism of renal origin: Secondary | ICD-10-CM | POA: Diagnosis not present

## 2017-01-02 DIAGNOSIS — D631 Anemia in chronic kidney disease: Secondary | ICD-10-CM | POA: Diagnosis not present

## 2017-01-04 DIAGNOSIS — N2581 Secondary hyperparathyroidism of renal origin: Secondary | ICD-10-CM | POA: Diagnosis not present

## 2017-01-04 DIAGNOSIS — D631 Anemia in chronic kidney disease: Secondary | ICD-10-CM | POA: Diagnosis not present

## 2017-01-04 DIAGNOSIS — N186 End stage renal disease: Secondary | ICD-10-CM | POA: Diagnosis not present

## 2017-01-07 DIAGNOSIS — D631 Anemia in chronic kidney disease: Secondary | ICD-10-CM | POA: Diagnosis not present

## 2017-01-07 DIAGNOSIS — N186 End stage renal disease: Secondary | ICD-10-CM | POA: Diagnosis not present

## 2017-01-07 DIAGNOSIS — N2581 Secondary hyperparathyroidism of renal origin: Secondary | ICD-10-CM | POA: Diagnosis not present

## 2017-01-08 ENCOUNTER — Ambulatory Visit: Payer: Self-pay | Admitting: Internal Medicine

## 2017-01-09 DIAGNOSIS — D631 Anemia in chronic kidney disease: Secondary | ICD-10-CM | POA: Diagnosis not present

## 2017-01-09 DIAGNOSIS — N186 End stage renal disease: Secondary | ICD-10-CM | POA: Diagnosis not present

## 2017-01-09 DIAGNOSIS — N2581 Secondary hyperparathyroidism of renal origin: Secondary | ICD-10-CM | POA: Diagnosis not present

## 2017-01-10 ENCOUNTER — Encounter: Payer: Self-pay | Admitting: Internal Medicine

## 2017-01-10 ENCOUNTER — Ambulatory Visit: Payer: Self-pay | Admitting: Internal Medicine

## 2017-01-10 ENCOUNTER — Ambulatory Visit (INDEPENDENT_AMBULATORY_CARE_PROVIDER_SITE_OTHER): Payer: Medicare Other | Admitting: Internal Medicine

## 2017-01-10 DIAGNOSIS — K219 Gastro-esophageal reflux disease without esophagitis: Secondary | ICD-10-CM | POA: Diagnosis not present

## 2017-01-10 DIAGNOSIS — I10 Essential (primary) hypertension: Secondary | ICD-10-CM | POA: Diagnosis not present

## 2017-01-10 DIAGNOSIS — Z992 Dependence on renal dialysis: Secondary | ICD-10-CM

## 2017-01-10 DIAGNOSIS — N186 End stage renal disease: Secondary | ICD-10-CM

## 2017-01-10 MED ORDER — TRIAMCINOLONE ACETONIDE 0.1 % EX CREA
1.0000 | TOPICAL_CREAM | Freq: Two times a day (BID) | CUTANEOUS | 3 refills | Status: DC
Start: 2017-01-10 — End: 2017-06-07

## 2017-01-10 NOTE — Assessment & Plan Note (Signed)
BP at goal on her lasix prn and regular dialysis.

## 2017-01-10 NOTE — Progress Notes (Signed)
Pre visit review using our clinic review tool, if applicable. No additional management support is needed unless otherwise documented below in the visit note. 

## 2017-01-10 NOTE — Assessment & Plan Note (Signed)
Given her history of GERD, ulcer and stricture would recommend to continue PPI. Will refill the protonix for daily usage.

## 2017-01-10 NOTE — Progress Notes (Signed)
   Subjective:    Patient ID: [name], female    DOB: Nov 22, 1975, 41 y.o.   MRN: [id number]  HPI The patient is a 41 YO female coming in for follow up of her GERD. We had started her on protonix after the last visit for her symptoms. She did have an ulcer in the past. She has done very well on the protonix daily. She is not having any recurrent symptoms. She denies nausea or vomiting, no dark stools or blood in stools. No new concerns.   Review of Systems  Constitutional: Negative.   Respiratory: Negative.   Cardiovascular: Negative.   Gastrointestinal: Positive for constipation. Negative for abdominal distention, abdominal pain, anal bleeding, blood in stool, diarrhea, nausea, rectal pain and vomiting.       Chronic and stable.   Musculoskeletal: Negative.   Neurological: Negative.       Objective:   Physical Exam  Constitutional: She is oriented to person, place, and time. She appears well-developed and well-nourished.  HENT:  Head: Normocephalic and atraumatic.  Eyes: EOM are normal.  Small stye, uninfected on the left eye  Neck: Normal range of motion.  Cardiovascular: Normal rate and regular rhythm.   Pulmonary/Chest: Effort normal and breath sounds normal.  Abdominal: Soft. She exhibits no distension. There is no tenderness. There is no rebound.  Neurological: She is alert and oriented to person, place, and time.  Skin: Skin is warm and dry.   Vitals:   01/10/17 0845  BP: 102/68  Pulse: (!) 107  Temp: 98.2 F (36.8 C)  TempSrc: Oral  SpO2: 98%  Weight: 131 lb (59.4 kg)  Height: 5\' 4"  (1.626 m)      Assessment & Plan:

## 2017-01-10 NOTE — Assessment & Plan Note (Signed)
No problems with her access and doing dialysis still. Caused by her lupus and this is under control at this time.

## 2017-01-10 NOTE — Patient Instructions (Signed)
We are not making any changes today.

## 2017-01-11 DIAGNOSIS — N2581 Secondary hyperparathyroidism of renal origin: Secondary | ICD-10-CM | POA: Diagnosis not present

## 2017-01-11 DIAGNOSIS — D631 Anemia in chronic kidney disease: Secondary | ICD-10-CM | POA: Diagnosis not present

## 2017-01-11 DIAGNOSIS — N186 End stage renal disease: Secondary | ICD-10-CM | POA: Diagnosis not present

## 2017-01-14 DIAGNOSIS — N2581 Secondary hyperparathyroidism of renal origin: Secondary | ICD-10-CM | POA: Diagnosis not present

## 2017-01-14 DIAGNOSIS — N186 End stage renal disease: Secondary | ICD-10-CM | POA: Diagnosis not present

## 2017-01-14 DIAGNOSIS — D631 Anemia in chronic kidney disease: Secondary | ICD-10-CM | POA: Diagnosis not present

## 2017-01-16 DIAGNOSIS — D631 Anemia in chronic kidney disease: Secondary | ICD-10-CM | POA: Diagnosis not present

## 2017-01-16 DIAGNOSIS — N186 End stage renal disease: Secondary | ICD-10-CM | POA: Diagnosis not present

## 2017-01-16 DIAGNOSIS — M3210 Systemic lupus erythematosus, organ or system involvement unspecified: Secondary | ICD-10-CM | POA: Diagnosis not present

## 2017-01-16 DIAGNOSIS — Z992 Dependence on renal dialysis: Secondary | ICD-10-CM | POA: Diagnosis not present

## 2017-01-16 DIAGNOSIS — N2581 Secondary hyperparathyroidism of renal origin: Secondary | ICD-10-CM | POA: Diagnosis not present

## 2017-01-18 DIAGNOSIS — N2581 Secondary hyperparathyroidism of renal origin: Secondary | ICD-10-CM | POA: Diagnosis not present

## 2017-01-18 DIAGNOSIS — D631 Anemia in chronic kidney disease: Secondary | ICD-10-CM | POA: Diagnosis not present

## 2017-01-18 DIAGNOSIS — N186 End stage renal disease: Secondary | ICD-10-CM | POA: Diagnosis not present

## 2017-01-21 DIAGNOSIS — N2581 Secondary hyperparathyroidism of renal origin: Secondary | ICD-10-CM | POA: Diagnosis not present

## 2017-01-21 DIAGNOSIS — D631 Anemia in chronic kidney disease: Secondary | ICD-10-CM | POA: Diagnosis not present

## 2017-01-21 DIAGNOSIS — N186 End stage renal disease: Secondary | ICD-10-CM | POA: Diagnosis not present

## 2017-01-23 DIAGNOSIS — N2581 Secondary hyperparathyroidism of renal origin: Secondary | ICD-10-CM | POA: Diagnosis not present

## 2017-01-23 DIAGNOSIS — D631 Anemia in chronic kidney disease: Secondary | ICD-10-CM | POA: Diagnosis not present

## 2017-01-23 DIAGNOSIS — N186 End stage renal disease: Secondary | ICD-10-CM | POA: Diagnosis not present

## 2017-01-25 DIAGNOSIS — N2581 Secondary hyperparathyroidism of renal origin: Secondary | ICD-10-CM | POA: Diagnosis not present

## 2017-01-25 DIAGNOSIS — N186 End stage renal disease: Secondary | ICD-10-CM | POA: Diagnosis not present

## 2017-01-25 DIAGNOSIS — D631 Anemia in chronic kidney disease: Secondary | ICD-10-CM | POA: Diagnosis not present

## 2017-01-28 DIAGNOSIS — N2581 Secondary hyperparathyroidism of renal origin: Secondary | ICD-10-CM | POA: Diagnosis not present

## 2017-01-28 DIAGNOSIS — N186 End stage renal disease: Secondary | ICD-10-CM | POA: Diagnosis not present

## 2017-01-28 DIAGNOSIS — D631 Anemia in chronic kidney disease: Secondary | ICD-10-CM | POA: Diagnosis not present

## 2017-01-30 DIAGNOSIS — N186 End stage renal disease: Secondary | ICD-10-CM | POA: Diagnosis not present

## 2017-01-30 DIAGNOSIS — D631 Anemia in chronic kidney disease: Secondary | ICD-10-CM | POA: Diagnosis not present

## 2017-01-30 DIAGNOSIS — N2581 Secondary hyperparathyroidism of renal origin: Secondary | ICD-10-CM | POA: Diagnosis not present

## 2017-02-01 DIAGNOSIS — N2581 Secondary hyperparathyroidism of renal origin: Secondary | ICD-10-CM | POA: Diagnosis not present

## 2017-02-01 DIAGNOSIS — D631 Anemia in chronic kidney disease: Secondary | ICD-10-CM | POA: Diagnosis not present

## 2017-02-01 DIAGNOSIS — N186 End stage renal disease: Secondary | ICD-10-CM | POA: Diagnosis not present

## 2017-02-04 DIAGNOSIS — N2581 Secondary hyperparathyroidism of renal origin: Secondary | ICD-10-CM | POA: Diagnosis not present

## 2017-02-04 DIAGNOSIS — D631 Anemia in chronic kidney disease: Secondary | ICD-10-CM | POA: Diagnosis not present

## 2017-02-04 DIAGNOSIS — N186 End stage renal disease: Secondary | ICD-10-CM | POA: Diagnosis not present

## 2017-02-05 DIAGNOSIS — N186 End stage renal disease: Secondary | ICD-10-CM | POA: Diagnosis not present

## 2017-02-05 DIAGNOSIS — Z992 Dependence on renal dialysis: Secondary | ICD-10-CM | POA: Diagnosis not present

## 2017-02-05 DIAGNOSIS — I871 Compression of vein: Secondary | ICD-10-CM | POA: Diagnosis not present

## 2017-02-05 DIAGNOSIS — T82858A Stenosis of vascular prosthetic devices, implants and grafts, initial encounter: Secondary | ICD-10-CM | POA: Diagnosis not present

## 2017-02-06 DIAGNOSIS — N186 End stage renal disease: Secondary | ICD-10-CM | POA: Diagnosis not present

## 2017-02-06 DIAGNOSIS — D631 Anemia in chronic kidney disease: Secondary | ICD-10-CM | POA: Diagnosis not present

## 2017-02-06 DIAGNOSIS — N2581 Secondary hyperparathyroidism of renal origin: Secondary | ICD-10-CM | POA: Diagnosis not present

## 2017-02-08 DIAGNOSIS — N186 End stage renal disease: Secondary | ICD-10-CM | POA: Diagnosis not present

## 2017-02-08 DIAGNOSIS — N2581 Secondary hyperparathyroidism of renal origin: Secondary | ICD-10-CM | POA: Diagnosis not present

## 2017-02-08 DIAGNOSIS — D631 Anemia in chronic kidney disease: Secondary | ICD-10-CM | POA: Diagnosis not present

## 2017-02-11 DIAGNOSIS — D631 Anemia in chronic kidney disease: Secondary | ICD-10-CM | POA: Diagnosis not present

## 2017-02-11 DIAGNOSIS — N2581 Secondary hyperparathyroidism of renal origin: Secondary | ICD-10-CM | POA: Diagnosis not present

## 2017-02-11 DIAGNOSIS — N186 End stage renal disease: Secondary | ICD-10-CM | POA: Diagnosis not present

## 2017-02-13 DIAGNOSIS — N2581 Secondary hyperparathyroidism of renal origin: Secondary | ICD-10-CM | POA: Diagnosis not present

## 2017-02-13 DIAGNOSIS — N186 End stage renal disease: Secondary | ICD-10-CM | POA: Diagnosis not present

## 2017-02-13 DIAGNOSIS — D631 Anemia in chronic kidney disease: Secondary | ICD-10-CM | POA: Diagnosis not present

## 2017-02-15 DIAGNOSIS — N2581 Secondary hyperparathyroidism of renal origin: Secondary | ICD-10-CM | POA: Diagnosis not present

## 2017-02-15 DIAGNOSIS — N186 End stage renal disease: Secondary | ICD-10-CM | POA: Diagnosis not present

## 2017-02-15 DIAGNOSIS — D631 Anemia in chronic kidney disease: Secondary | ICD-10-CM | POA: Diagnosis not present

## 2017-02-16 DIAGNOSIS — M3210 Systemic lupus erythematosus, organ or system involvement unspecified: Secondary | ICD-10-CM | POA: Diagnosis not present

## 2017-02-16 DIAGNOSIS — N186 End stage renal disease: Secondary | ICD-10-CM | POA: Diagnosis not present

## 2017-02-16 DIAGNOSIS — Z992 Dependence on renal dialysis: Secondary | ICD-10-CM | POA: Diagnosis not present

## 2017-02-18 DIAGNOSIS — N186 End stage renal disease: Secondary | ICD-10-CM | POA: Diagnosis not present

## 2017-02-18 DIAGNOSIS — N2581 Secondary hyperparathyroidism of renal origin: Secondary | ICD-10-CM | POA: Diagnosis not present

## 2017-02-18 DIAGNOSIS — D631 Anemia in chronic kidney disease: Secondary | ICD-10-CM | POA: Diagnosis not present

## 2017-02-20 DIAGNOSIS — N2581 Secondary hyperparathyroidism of renal origin: Secondary | ICD-10-CM | POA: Diagnosis not present

## 2017-02-20 DIAGNOSIS — N186 End stage renal disease: Secondary | ICD-10-CM | POA: Diagnosis not present

## 2017-02-20 DIAGNOSIS — D631 Anemia in chronic kidney disease: Secondary | ICD-10-CM | POA: Diagnosis not present

## 2017-02-22 DIAGNOSIS — N186 End stage renal disease: Secondary | ICD-10-CM | POA: Diagnosis not present

## 2017-02-22 DIAGNOSIS — D631 Anemia in chronic kidney disease: Secondary | ICD-10-CM | POA: Diagnosis not present

## 2017-02-22 DIAGNOSIS — N2581 Secondary hyperparathyroidism of renal origin: Secondary | ICD-10-CM | POA: Diagnosis not present

## 2017-02-25 DIAGNOSIS — N186 End stage renal disease: Secondary | ICD-10-CM | POA: Diagnosis not present

## 2017-02-25 DIAGNOSIS — N2581 Secondary hyperparathyroidism of renal origin: Secondary | ICD-10-CM | POA: Diagnosis not present

## 2017-02-25 DIAGNOSIS — D631 Anemia in chronic kidney disease: Secondary | ICD-10-CM | POA: Diagnosis not present

## 2017-02-27 DIAGNOSIS — N186 End stage renal disease: Secondary | ICD-10-CM | POA: Diagnosis not present

## 2017-02-27 DIAGNOSIS — N2581 Secondary hyperparathyroidism of renal origin: Secondary | ICD-10-CM | POA: Diagnosis not present

## 2017-02-27 DIAGNOSIS — D631 Anemia in chronic kidney disease: Secondary | ICD-10-CM | POA: Diagnosis not present

## 2017-03-01 DIAGNOSIS — D631 Anemia in chronic kidney disease: Secondary | ICD-10-CM | POA: Diagnosis not present

## 2017-03-01 DIAGNOSIS — N2581 Secondary hyperparathyroidism of renal origin: Secondary | ICD-10-CM | POA: Diagnosis not present

## 2017-03-01 DIAGNOSIS — N186 End stage renal disease: Secondary | ICD-10-CM | POA: Diagnosis not present

## 2017-03-04 DIAGNOSIS — N2581 Secondary hyperparathyroidism of renal origin: Secondary | ICD-10-CM | POA: Diagnosis not present

## 2017-03-04 DIAGNOSIS — D631 Anemia in chronic kidney disease: Secondary | ICD-10-CM | POA: Diagnosis not present

## 2017-03-04 DIAGNOSIS — N186 End stage renal disease: Secondary | ICD-10-CM | POA: Diagnosis not present

## 2017-03-06 DIAGNOSIS — D631 Anemia in chronic kidney disease: Secondary | ICD-10-CM | POA: Diagnosis not present

## 2017-03-06 DIAGNOSIS — N186 End stage renal disease: Secondary | ICD-10-CM | POA: Diagnosis not present

## 2017-03-06 DIAGNOSIS — N2581 Secondary hyperparathyroidism of renal origin: Secondary | ICD-10-CM | POA: Diagnosis not present

## 2017-03-08 DIAGNOSIS — N186 End stage renal disease: Secondary | ICD-10-CM | POA: Diagnosis not present

## 2017-03-08 DIAGNOSIS — N2581 Secondary hyperparathyroidism of renal origin: Secondary | ICD-10-CM | POA: Diagnosis not present

## 2017-03-08 DIAGNOSIS — D631 Anemia in chronic kidney disease: Secondary | ICD-10-CM | POA: Diagnosis not present

## 2017-03-11 DIAGNOSIS — D631 Anemia in chronic kidney disease: Secondary | ICD-10-CM | POA: Diagnosis not present

## 2017-03-11 DIAGNOSIS — N2581 Secondary hyperparathyroidism of renal origin: Secondary | ICD-10-CM | POA: Diagnosis not present

## 2017-03-11 DIAGNOSIS — N186 End stage renal disease: Secondary | ICD-10-CM | POA: Diagnosis not present

## 2017-03-13 DIAGNOSIS — N186 End stage renal disease: Secondary | ICD-10-CM | POA: Diagnosis not present

## 2017-03-13 DIAGNOSIS — N2581 Secondary hyperparathyroidism of renal origin: Secondary | ICD-10-CM | POA: Diagnosis not present

## 2017-03-13 DIAGNOSIS — D631 Anemia in chronic kidney disease: Secondary | ICD-10-CM | POA: Diagnosis not present

## 2017-03-15 DIAGNOSIS — N186 End stage renal disease: Secondary | ICD-10-CM | POA: Diagnosis not present

## 2017-03-15 DIAGNOSIS — D631 Anemia in chronic kidney disease: Secondary | ICD-10-CM | POA: Diagnosis not present

## 2017-03-15 DIAGNOSIS — N2581 Secondary hyperparathyroidism of renal origin: Secondary | ICD-10-CM | POA: Diagnosis not present

## 2017-03-18 DIAGNOSIS — N2581 Secondary hyperparathyroidism of renal origin: Secondary | ICD-10-CM | POA: Diagnosis not present

## 2017-03-18 DIAGNOSIS — M3210 Systemic lupus erythematosus, organ or system involvement unspecified: Secondary | ICD-10-CM | POA: Diagnosis not present

## 2017-03-18 DIAGNOSIS — N186 End stage renal disease: Secondary | ICD-10-CM | POA: Diagnosis not present

## 2017-03-18 DIAGNOSIS — D631 Anemia in chronic kidney disease: Secondary | ICD-10-CM | POA: Diagnosis not present

## 2017-03-18 DIAGNOSIS — Z992 Dependence on renal dialysis: Secondary | ICD-10-CM | POA: Diagnosis not present

## 2017-03-20 DIAGNOSIS — N2581 Secondary hyperparathyroidism of renal origin: Secondary | ICD-10-CM | POA: Diagnosis not present

## 2017-03-20 DIAGNOSIS — D631 Anemia in chronic kidney disease: Secondary | ICD-10-CM | POA: Diagnosis not present

## 2017-03-20 DIAGNOSIS — N186 End stage renal disease: Secondary | ICD-10-CM | POA: Diagnosis not present

## 2017-03-22 DIAGNOSIS — N2581 Secondary hyperparathyroidism of renal origin: Secondary | ICD-10-CM | POA: Diagnosis not present

## 2017-03-22 DIAGNOSIS — N186 End stage renal disease: Secondary | ICD-10-CM | POA: Diagnosis not present

## 2017-03-22 DIAGNOSIS — D631 Anemia in chronic kidney disease: Secondary | ICD-10-CM | POA: Diagnosis not present

## 2017-03-25 DIAGNOSIS — N186 End stage renal disease: Secondary | ICD-10-CM | POA: Diagnosis not present

## 2017-03-25 DIAGNOSIS — D631 Anemia in chronic kidney disease: Secondary | ICD-10-CM | POA: Diagnosis not present

## 2017-03-25 DIAGNOSIS — N2581 Secondary hyperparathyroidism of renal origin: Secondary | ICD-10-CM | POA: Diagnosis not present

## 2017-03-27 DIAGNOSIS — D631 Anemia in chronic kidney disease: Secondary | ICD-10-CM | POA: Diagnosis not present

## 2017-03-27 DIAGNOSIS — N186 End stage renal disease: Secondary | ICD-10-CM | POA: Diagnosis not present

## 2017-03-27 DIAGNOSIS — N2581 Secondary hyperparathyroidism of renal origin: Secondary | ICD-10-CM | POA: Diagnosis not present

## 2017-03-29 DIAGNOSIS — N186 End stage renal disease: Secondary | ICD-10-CM | POA: Diagnosis not present

## 2017-03-29 DIAGNOSIS — N2581 Secondary hyperparathyroidism of renal origin: Secondary | ICD-10-CM | POA: Diagnosis not present

## 2017-03-29 DIAGNOSIS — D631 Anemia in chronic kidney disease: Secondary | ICD-10-CM | POA: Diagnosis not present

## 2017-04-01 DIAGNOSIS — D631 Anemia in chronic kidney disease: Secondary | ICD-10-CM | POA: Diagnosis not present

## 2017-04-01 DIAGNOSIS — N186 End stage renal disease: Secondary | ICD-10-CM | POA: Diagnosis not present

## 2017-04-01 DIAGNOSIS — N2581 Secondary hyperparathyroidism of renal origin: Secondary | ICD-10-CM | POA: Diagnosis not present

## 2017-04-03 DIAGNOSIS — D631 Anemia in chronic kidney disease: Secondary | ICD-10-CM | POA: Diagnosis not present

## 2017-04-03 DIAGNOSIS — N186 End stage renal disease: Secondary | ICD-10-CM | POA: Diagnosis not present

## 2017-04-03 DIAGNOSIS — N2581 Secondary hyperparathyroidism of renal origin: Secondary | ICD-10-CM | POA: Diagnosis not present

## 2017-04-05 DIAGNOSIS — N186 End stage renal disease: Secondary | ICD-10-CM | POA: Diagnosis not present

## 2017-04-05 DIAGNOSIS — D631 Anemia in chronic kidney disease: Secondary | ICD-10-CM | POA: Diagnosis not present

## 2017-04-05 DIAGNOSIS — N2581 Secondary hyperparathyroidism of renal origin: Secondary | ICD-10-CM | POA: Diagnosis not present

## 2017-04-08 DIAGNOSIS — D631 Anemia in chronic kidney disease: Secondary | ICD-10-CM | POA: Diagnosis not present

## 2017-04-08 DIAGNOSIS — N186 End stage renal disease: Secondary | ICD-10-CM | POA: Diagnosis not present

## 2017-04-08 DIAGNOSIS — N2581 Secondary hyperparathyroidism of renal origin: Secondary | ICD-10-CM | POA: Diagnosis not present

## 2017-04-10 DIAGNOSIS — N186 End stage renal disease: Secondary | ICD-10-CM | POA: Diagnosis not present

## 2017-04-10 DIAGNOSIS — D631 Anemia in chronic kidney disease: Secondary | ICD-10-CM | POA: Diagnosis not present

## 2017-04-10 DIAGNOSIS — N2581 Secondary hyperparathyroidism of renal origin: Secondary | ICD-10-CM | POA: Diagnosis not present

## 2017-04-12 DIAGNOSIS — N2581 Secondary hyperparathyroidism of renal origin: Secondary | ICD-10-CM | POA: Diagnosis not present

## 2017-04-12 DIAGNOSIS — N186 End stage renal disease: Secondary | ICD-10-CM | POA: Diagnosis not present

## 2017-04-12 DIAGNOSIS — D631 Anemia in chronic kidney disease: Secondary | ICD-10-CM | POA: Diagnosis not present

## 2017-04-15 DIAGNOSIS — N186 End stage renal disease: Secondary | ICD-10-CM | POA: Diagnosis not present

## 2017-04-15 DIAGNOSIS — N2581 Secondary hyperparathyroidism of renal origin: Secondary | ICD-10-CM | POA: Diagnosis not present

## 2017-04-15 DIAGNOSIS — D631 Anemia in chronic kidney disease: Secondary | ICD-10-CM | POA: Diagnosis not present

## 2017-04-17 DIAGNOSIS — N2581 Secondary hyperparathyroidism of renal origin: Secondary | ICD-10-CM | POA: Diagnosis not present

## 2017-04-17 DIAGNOSIS — N186 End stage renal disease: Secondary | ICD-10-CM | POA: Diagnosis not present

## 2017-04-17 DIAGNOSIS — D631 Anemia in chronic kidney disease: Secondary | ICD-10-CM | POA: Diagnosis not present

## 2017-04-18 DIAGNOSIS — M3210 Systemic lupus erythematosus, organ or system involvement unspecified: Secondary | ICD-10-CM | POA: Diagnosis not present

## 2017-04-18 DIAGNOSIS — N186 End stage renal disease: Secondary | ICD-10-CM | POA: Diagnosis not present

## 2017-04-18 DIAGNOSIS — Z992 Dependence on renal dialysis: Secondary | ICD-10-CM | POA: Diagnosis not present

## 2017-04-19 DIAGNOSIS — D631 Anemia in chronic kidney disease: Secondary | ICD-10-CM | POA: Diagnosis not present

## 2017-04-19 DIAGNOSIS — N2581 Secondary hyperparathyroidism of renal origin: Secondary | ICD-10-CM | POA: Diagnosis not present

## 2017-04-19 DIAGNOSIS — N186 End stage renal disease: Secondary | ICD-10-CM | POA: Diagnosis not present

## 2017-04-22 DIAGNOSIS — N2581 Secondary hyperparathyroidism of renal origin: Secondary | ICD-10-CM | POA: Diagnosis not present

## 2017-04-22 DIAGNOSIS — D631 Anemia in chronic kidney disease: Secondary | ICD-10-CM | POA: Diagnosis not present

## 2017-04-22 DIAGNOSIS — N186 End stage renal disease: Secondary | ICD-10-CM | POA: Diagnosis not present

## 2017-04-24 DIAGNOSIS — N186 End stage renal disease: Secondary | ICD-10-CM | POA: Diagnosis not present

## 2017-04-24 DIAGNOSIS — D631 Anemia in chronic kidney disease: Secondary | ICD-10-CM | POA: Diagnosis not present

## 2017-04-24 DIAGNOSIS — N2581 Secondary hyperparathyroidism of renal origin: Secondary | ICD-10-CM | POA: Diagnosis not present

## 2017-04-26 DIAGNOSIS — D631 Anemia in chronic kidney disease: Secondary | ICD-10-CM | POA: Diagnosis not present

## 2017-04-26 DIAGNOSIS — N2581 Secondary hyperparathyroidism of renal origin: Secondary | ICD-10-CM | POA: Diagnosis not present

## 2017-04-26 DIAGNOSIS — N186 End stage renal disease: Secondary | ICD-10-CM | POA: Diagnosis not present

## 2017-04-29 DIAGNOSIS — D631 Anemia in chronic kidney disease: Secondary | ICD-10-CM | POA: Diagnosis not present

## 2017-04-29 DIAGNOSIS — N186 End stage renal disease: Secondary | ICD-10-CM | POA: Diagnosis not present

## 2017-04-29 DIAGNOSIS — N2581 Secondary hyperparathyroidism of renal origin: Secondary | ICD-10-CM | POA: Diagnosis not present

## 2017-05-01 DIAGNOSIS — N2581 Secondary hyperparathyroidism of renal origin: Secondary | ICD-10-CM | POA: Diagnosis not present

## 2017-05-01 DIAGNOSIS — N186 End stage renal disease: Secondary | ICD-10-CM | POA: Diagnosis not present

## 2017-05-01 DIAGNOSIS — D631 Anemia in chronic kidney disease: Secondary | ICD-10-CM | POA: Diagnosis not present

## 2017-05-03 DIAGNOSIS — D631 Anemia in chronic kidney disease: Secondary | ICD-10-CM | POA: Diagnosis not present

## 2017-05-03 DIAGNOSIS — N186 End stage renal disease: Secondary | ICD-10-CM | POA: Diagnosis not present

## 2017-05-03 DIAGNOSIS — N2581 Secondary hyperparathyroidism of renal origin: Secondary | ICD-10-CM | POA: Diagnosis not present

## 2017-05-06 DIAGNOSIS — N186 End stage renal disease: Secondary | ICD-10-CM | POA: Diagnosis not present

## 2017-05-06 DIAGNOSIS — D631 Anemia in chronic kidney disease: Secondary | ICD-10-CM | POA: Diagnosis not present

## 2017-05-06 DIAGNOSIS — N2581 Secondary hyperparathyroidism of renal origin: Secondary | ICD-10-CM | POA: Diagnosis not present

## 2017-05-07 ENCOUNTER — Other Ambulatory Visit: Payer: Self-pay | Admitting: Internal Medicine

## 2017-05-08 DIAGNOSIS — N186 End stage renal disease: Secondary | ICD-10-CM | POA: Diagnosis not present

## 2017-05-08 DIAGNOSIS — N2581 Secondary hyperparathyroidism of renal origin: Secondary | ICD-10-CM | POA: Diagnosis not present

## 2017-05-08 DIAGNOSIS — D631 Anemia in chronic kidney disease: Secondary | ICD-10-CM | POA: Diagnosis not present

## 2017-05-10 DIAGNOSIS — D631 Anemia in chronic kidney disease: Secondary | ICD-10-CM | POA: Diagnosis not present

## 2017-05-10 DIAGNOSIS — N2581 Secondary hyperparathyroidism of renal origin: Secondary | ICD-10-CM | POA: Diagnosis not present

## 2017-05-10 DIAGNOSIS — N186 End stage renal disease: Secondary | ICD-10-CM | POA: Diagnosis not present

## 2017-05-13 DIAGNOSIS — D631 Anemia in chronic kidney disease: Secondary | ICD-10-CM | POA: Diagnosis not present

## 2017-05-13 DIAGNOSIS — N2581 Secondary hyperparathyroidism of renal origin: Secondary | ICD-10-CM | POA: Diagnosis not present

## 2017-05-13 DIAGNOSIS — N186 End stage renal disease: Secondary | ICD-10-CM | POA: Diagnosis not present

## 2017-05-15 DIAGNOSIS — D631 Anemia in chronic kidney disease: Secondary | ICD-10-CM | POA: Diagnosis not present

## 2017-05-15 DIAGNOSIS — N2581 Secondary hyperparathyroidism of renal origin: Secondary | ICD-10-CM | POA: Diagnosis not present

## 2017-05-15 DIAGNOSIS — N186 End stage renal disease: Secondary | ICD-10-CM | POA: Diagnosis not present

## 2017-05-17 DIAGNOSIS — N186 End stage renal disease: Secondary | ICD-10-CM | POA: Diagnosis not present

## 2017-05-17 DIAGNOSIS — D631 Anemia in chronic kidney disease: Secondary | ICD-10-CM | POA: Diagnosis not present

## 2017-05-17 DIAGNOSIS — N2581 Secondary hyperparathyroidism of renal origin: Secondary | ICD-10-CM | POA: Diagnosis not present

## 2017-05-18 DIAGNOSIS — N186 End stage renal disease: Secondary | ICD-10-CM | POA: Diagnosis not present

## 2017-05-18 DIAGNOSIS — Z992 Dependence on renal dialysis: Secondary | ICD-10-CM | POA: Diagnosis not present

## 2017-05-18 DIAGNOSIS — M3210 Systemic lupus erythematosus, organ or system involvement unspecified: Secondary | ICD-10-CM | POA: Diagnosis not present

## 2017-05-20 DIAGNOSIS — N186 End stage renal disease: Secondary | ICD-10-CM | POA: Diagnosis not present

## 2017-05-20 DIAGNOSIS — D631 Anemia in chronic kidney disease: Secondary | ICD-10-CM | POA: Diagnosis not present

## 2017-05-20 DIAGNOSIS — N2581 Secondary hyperparathyroidism of renal origin: Secondary | ICD-10-CM | POA: Diagnosis not present

## 2017-05-20 DIAGNOSIS — D509 Iron deficiency anemia, unspecified: Secondary | ICD-10-CM | POA: Diagnosis not present

## 2017-05-22 DIAGNOSIS — N186 End stage renal disease: Secondary | ICD-10-CM | POA: Diagnosis not present

## 2017-05-22 DIAGNOSIS — D631 Anemia in chronic kidney disease: Secondary | ICD-10-CM | POA: Diagnosis not present

## 2017-05-22 DIAGNOSIS — N2581 Secondary hyperparathyroidism of renal origin: Secondary | ICD-10-CM | POA: Diagnosis not present

## 2017-05-22 DIAGNOSIS — D509 Iron deficiency anemia, unspecified: Secondary | ICD-10-CM | POA: Diagnosis not present

## 2017-05-24 DIAGNOSIS — N2581 Secondary hyperparathyroidism of renal origin: Secondary | ICD-10-CM | POA: Diagnosis not present

## 2017-05-24 DIAGNOSIS — N186 End stage renal disease: Secondary | ICD-10-CM | POA: Diagnosis not present

## 2017-05-24 DIAGNOSIS — D509 Iron deficiency anemia, unspecified: Secondary | ICD-10-CM | POA: Diagnosis not present

## 2017-05-24 DIAGNOSIS — D631 Anemia in chronic kidney disease: Secondary | ICD-10-CM | POA: Diagnosis not present

## 2017-05-27 DIAGNOSIS — N2581 Secondary hyperparathyroidism of renal origin: Secondary | ICD-10-CM | POA: Diagnosis not present

## 2017-05-27 DIAGNOSIS — D509 Iron deficiency anemia, unspecified: Secondary | ICD-10-CM | POA: Diagnosis not present

## 2017-05-27 DIAGNOSIS — N186 End stage renal disease: Secondary | ICD-10-CM | POA: Diagnosis not present

## 2017-05-27 DIAGNOSIS — D631 Anemia in chronic kidney disease: Secondary | ICD-10-CM | POA: Diagnosis not present

## 2017-05-29 DIAGNOSIS — D631 Anemia in chronic kidney disease: Secondary | ICD-10-CM | POA: Diagnosis not present

## 2017-05-29 DIAGNOSIS — N186 End stage renal disease: Secondary | ICD-10-CM | POA: Diagnosis not present

## 2017-05-29 DIAGNOSIS — N2581 Secondary hyperparathyroidism of renal origin: Secondary | ICD-10-CM | POA: Diagnosis not present

## 2017-05-29 DIAGNOSIS — D509 Iron deficiency anemia, unspecified: Secondary | ICD-10-CM | POA: Diagnosis not present

## 2017-05-30 DIAGNOSIS — N2581 Secondary hyperparathyroidism of renal origin: Secondary | ICD-10-CM | POA: Diagnosis not present

## 2017-05-30 DIAGNOSIS — E8770 Fluid overload, unspecified: Secondary | ICD-10-CM | POA: Diagnosis not present

## 2017-05-30 DIAGNOSIS — N186 End stage renal disease: Secondary | ICD-10-CM | POA: Diagnosis not present

## 2017-05-31 DIAGNOSIS — D509 Iron deficiency anemia, unspecified: Secondary | ICD-10-CM | POA: Diagnosis not present

## 2017-05-31 DIAGNOSIS — N2581 Secondary hyperparathyroidism of renal origin: Secondary | ICD-10-CM | POA: Diagnosis not present

## 2017-05-31 DIAGNOSIS — D631 Anemia in chronic kidney disease: Secondary | ICD-10-CM | POA: Diagnosis not present

## 2017-05-31 DIAGNOSIS — N186 End stage renal disease: Secondary | ICD-10-CM | POA: Diagnosis not present

## 2017-06-03 DIAGNOSIS — N2581 Secondary hyperparathyroidism of renal origin: Secondary | ICD-10-CM | POA: Diagnosis not present

## 2017-06-03 DIAGNOSIS — D631 Anemia in chronic kidney disease: Secondary | ICD-10-CM | POA: Diagnosis not present

## 2017-06-03 DIAGNOSIS — N186 End stage renal disease: Secondary | ICD-10-CM | POA: Diagnosis not present

## 2017-06-03 DIAGNOSIS — D509 Iron deficiency anemia, unspecified: Secondary | ICD-10-CM | POA: Diagnosis not present

## 2017-06-05 DIAGNOSIS — N2581 Secondary hyperparathyroidism of renal origin: Secondary | ICD-10-CM | POA: Diagnosis not present

## 2017-06-05 DIAGNOSIS — D631 Anemia in chronic kidney disease: Secondary | ICD-10-CM | POA: Diagnosis not present

## 2017-06-05 DIAGNOSIS — D509 Iron deficiency anemia, unspecified: Secondary | ICD-10-CM | POA: Diagnosis not present

## 2017-06-05 DIAGNOSIS — N186 End stage renal disease: Secondary | ICD-10-CM | POA: Diagnosis not present

## 2017-06-07 ENCOUNTER — Ambulatory Visit (INDEPENDENT_AMBULATORY_CARE_PROVIDER_SITE_OTHER): Payer: Medicare Other | Admitting: Internal Medicine

## 2017-06-07 ENCOUNTER — Encounter: Payer: Self-pay | Admitting: Internal Medicine

## 2017-06-07 VITALS — BP 144/98 | HR 98 | Ht 64.0 in | Wt 133.0 lb

## 2017-06-07 DIAGNOSIS — H00024 Hordeolum internum left upper eyelid: Secondary | ICD-10-CM | POA: Diagnosis not present

## 2017-06-07 DIAGNOSIS — H00036 Abscess of eyelid left eye, unspecified eyelid: Secondary | ICD-10-CM | POA: Insufficient documentation

## 2017-06-07 DIAGNOSIS — N186 End stage renal disease: Secondary | ICD-10-CM | POA: Diagnosis not present

## 2017-06-07 DIAGNOSIS — H00021 Hordeolum internum right upper eyelid: Secondary | ICD-10-CM | POA: Diagnosis not present

## 2017-06-07 DIAGNOSIS — D631 Anemia in chronic kidney disease: Secondary | ICD-10-CM | POA: Diagnosis not present

## 2017-06-07 DIAGNOSIS — H0011 Chalazion right upper eyelid: Secondary | ICD-10-CM | POA: Diagnosis not present

## 2017-06-07 DIAGNOSIS — H25042 Posterior subcapsular polar age-related cataract, left eye: Secondary | ICD-10-CM | POA: Diagnosis not present

## 2017-06-07 DIAGNOSIS — H0013 Chalazion right eye, unspecified eyelid: Secondary | ICD-10-CM | POA: Insufficient documentation

## 2017-06-07 DIAGNOSIS — D509 Iron deficiency anemia, unspecified: Secondary | ICD-10-CM | POA: Diagnosis not present

## 2017-06-07 DIAGNOSIS — N2581 Secondary hyperparathyroidism of renal origin: Secondary | ICD-10-CM | POA: Diagnosis not present

## 2017-06-07 MED ORDER — CEPHALEXIN 500 MG PO CAPS: 500.0000 mg | ORAL_CAPSULE | Freq: Three times a day (TID) | ORAL | 0 refills | Status: AC

## 2017-06-07 NOTE — Assessment & Plan Note (Signed)
Less acute but still significant, also for optho eval

## 2017-06-07 NOTE — Progress Notes (Signed)
Subjective:    Patient ID: [name], female    DOB: 09/09/1976, 41 y.o.   MRN: [id number]  HPI  41 yo F with hx of SLE and ESRD on dialysis, here with 2 concerns; both upper eyelids have had stye x 1 each, with the right x 2 mo, and now the left x 3 wks with the right one evolving into benign appearing chalazion, but the left upper more recent is painful, swelling, draining, but no fever or vision change.  No conjunctival involvement. Denies other sinus, ear, throat symptoms. Pt denies chest pain, increased sob or doe, wheezing, orthopnea, PND, increased LE swelling, palpitations, dizziness or syncope.  Had HD earlier today. Past Medical History:  Diagnosis Date  . Anemia   . Avascular necrosis of bone (HCC)    left tibial talus due to chronic prednisone use  . 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 Headache(784.0)   . Hyperlipemia   . Hypertension   . Lupus   . Secondary hyperparathyroidism (of renal origin)    Past Surgical History:  Procedure Laterality Date  . AV FISTULA PLACEMENT Left   . AV FISTULA PLACEMENT Right 01/26/2014   Procedure: ARTERIOVENOUS (AV) FISTULA CREATION; ultrasound guided;  Surgeon: Conrad Rosedale, MD;  Location: [place];  Service: Vascular;  Laterality: Right;  . ESOPHAGOGASTRODUODENOSCOPY N/A 03/17/2016   Procedure: ESOPHAGOGASTRODUODENOSCOPY (EGD);  Surgeon: Irene Shipper, MD;  Location: [facility] ENDOSCOPY;  Service: Endoscopy;  Laterality: N/A;  . HEMATOMA EVACUATION Left 10/09/2013   Procedure: EVACUATION HEMATOMA;  Surgeon: Angelia Mould, MD;  Location: [place];  Service: Vascular;  Laterality: Left;  . INCISION AND DRAINAGE ABSCESS     gluteal  . INSERTION OF DIALYSIS CATHETER N/A 10/09/2013   Procedure: INSERTION OF DIALYSIS CATHETER; ULTRASOUND GUIDED;  Surgeon: Angelia Mould, MD;  Location: [place];  Service: Vascular;  Laterality: N/A;  .  SHUNTOGRAM N/A 01/07/2014   Procedure: fistulogram with possibe venoplasty left upper arm avg;  Surgeon: Angelia Mould, MD;  Location: [facility] CATH LAB;  Service: Cardiovascular;  Laterality: N/A;  . tibiocalcaneal fusion  left      reports that she has never smoked. She has never used smokeless tobacco. She reports that she does not drink alcohol or use drugs. family history includes Asthma in her sister; Heart attack in her mother; Hypertension in her mother. Allergies  Allergen Reactions  . Amlodipine Hives    Swelling  . Sulfa Antibiotics Hives and Itching  . Vancomycin Hives and Itching   Current Outpatient Prescriptions on File Prior to Visit  Medication Sig Dispense Refill  . acetaminophen (TYLENOL) 500 MG tablet Take 500 mg by mouth as needed for headache.     . calcium acetate (PHOSLO) 667 MG capsule Take 1,334 mg by mouth 3 (three) times daily with meals.     Marland Kitchen FLUoxetine (PROZAC) 10 MG tablet Take 1 pill daily for 2 weeks, then increase to 2 pills daily. 45 tablet 0  . multivitamin (RENA-VIT) TABS tablet Take 1 tablet by mouth at bedtime. 30 each 0  . pantoprazole (PROTONIX) 40 MG tablet take 1 tablet by mouth once daily 30 tablet 6  . predniSONE (DELTASONE) 10 MG tablet Take 10 mg by mouth daily with breakfast.    . traZODone (DESYREL) 50 MG tablet Take 25 mg by mouth at bedtime.      No current  facility-administered medications on file prior to visit.    Review of Systems  All otherwise neg per pt     Objective:   Physical Exam BP (!) 144/98   Pulse 98   Ht 5\' 4"  (1.626 m)   Wt 133 lb (60.3 kg)   SpO2 100%   BMI 22.83 kg/m  VS noted,  Constitutional: Pt appears in NAD HENT: Head: NCAT.  Right Ear: External ear normal.  Left Ear: External ear normal.  Eyes: . Pupils are equal, round, and reactive to light. Conjunctivae and EOM are normal Left upper eyelid with 8-10 mm raised tender lesion with mild drainage and surrounding slight erythema Right upper eyelid  with cystic type mass also about 8-10 mm without tenderness, drainage or overlying skin change Nose: without d/c or deformity Neck: Neck supple. Gross normal ROM Cardiovascular: Normal rate and regular rhythm.   Pulmonary/Chest: Effort normal and breath sounds without rales or wheezing.  Neurological: Pt is alert. At baseline orientation, motor grossly intact Skin: Skin is warm. No rashes, other new lesions, no LE edema Psychiatric: Pt behavior is normal without agitation  No other exam findings Lab Results  Component Value Date   WBC 12.5 (H) 03/19/2016   HGB 10.4 (L) 03/19/2016   HCT 31.1 (L) 03/19/2016   PLT 47 (L) 03/19/2016   GLUCOSE 113 (H) 03/19/2016   ALT 11 06/13/2011   AST 16 06/13/2011   NA 138 03/19/2016   K 4.9 03/19/2016   CL 101 03/19/2016   CREATININE 8.98 (H) 03/19/2016   BUN 100 (H) 03/19/2016   CO2 22 03/19/2016   TSH 1.852 06/08/2011   INR 1.20 03/17/2016   HGBA1C 6.6 (H) 06/08/2011       Assessment & Plan:

## 2017-06-07 NOTE — Patient Instructions (Signed)
Please take all new medication as prescribed - the antibiotic  You will be contacted regarding the referral for: Eye doctor (to see [facility] now)  Please continue all other medications as before, and refills have been done if requested.  Please have the pharmacy call with any other refills you may need.  Please keep your appointments with your specialists as you may have planned

## 2017-06-07 NOTE — Assessment & Plan Note (Signed)
Small, but significant, for cephalexin, also refer optho urgent

## 2017-06-10 DIAGNOSIS — N2581 Secondary hyperparathyroidism of renal origin: Secondary | ICD-10-CM | POA: Diagnosis not present

## 2017-06-10 DIAGNOSIS — N186 End stage renal disease: Secondary | ICD-10-CM | POA: Diagnosis not present

## 2017-06-10 DIAGNOSIS — D631 Anemia in chronic kidney disease: Secondary | ICD-10-CM | POA: Diagnosis not present

## 2017-06-10 DIAGNOSIS — D509 Iron deficiency anemia, unspecified: Secondary | ICD-10-CM | POA: Diagnosis not present

## 2017-06-12 DIAGNOSIS — D631 Anemia in chronic kidney disease: Secondary | ICD-10-CM | POA: Diagnosis not present

## 2017-06-12 DIAGNOSIS — N186 End stage renal disease: Secondary | ICD-10-CM | POA: Diagnosis not present

## 2017-06-12 DIAGNOSIS — N2581 Secondary hyperparathyroidism of renal origin: Secondary | ICD-10-CM | POA: Diagnosis not present

## 2017-06-12 DIAGNOSIS — D509 Iron deficiency anemia, unspecified: Secondary | ICD-10-CM | POA: Diagnosis not present

## 2017-06-14 DIAGNOSIS — D631 Anemia in chronic kidney disease: Secondary | ICD-10-CM | POA: Diagnosis not present

## 2017-06-14 DIAGNOSIS — N2581 Secondary hyperparathyroidism of renal origin: Secondary | ICD-10-CM | POA: Diagnosis not present

## 2017-06-14 DIAGNOSIS — N186 End stage renal disease: Secondary | ICD-10-CM | POA: Diagnosis not present

## 2017-06-14 DIAGNOSIS — D509 Iron deficiency anemia, unspecified: Secondary | ICD-10-CM | POA: Diagnosis not present

## 2017-06-17 DIAGNOSIS — N2581 Secondary hyperparathyroidism of renal origin: Secondary | ICD-10-CM | POA: Diagnosis not present

## 2017-06-17 DIAGNOSIS — D509 Iron deficiency anemia, unspecified: Secondary | ICD-10-CM | POA: Diagnosis not present

## 2017-06-17 DIAGNOSIS — N186 End stage renal disease: Secondary | ICD-10-CM | POA: Diagnosis not present

## 2017-06-17 DIAGNOSIS — D631 Anemia in chronic kidney disease: Secondary | ICD-10-CM | POA: Diagnosis not present

## 2017-06-18 DIAGNOSIS — N186 End stage renal disease: Secondary | ICD-10-CM | POA: Diagnosis not present

## 2017-06-18 DIAGNOSIS — Z992 Dependence on renal dialysis: Secondary | ICD-10-CM | POA: Diagnosis not present

## 2017-06-18 DIAGNOSIS — M3210 Systemic lupus erythematosus, organ or system involvement unspecified: Secondary | ICD-10-CM | POA: Diagnosis not present

## 2017-06-19 DIAGNOSIS — D631 Anemia in chronic kidney disease: Secondary | ICD-10-CM | POA: Diagnosis not present

## 2017-06-19 DIAGNOSIS — N2581 Secondary hyperparathyroidism of renal origin: Secondary | ICD-10-CM | POA: Diagnosis not present

## 2017-06-19 DIAGNOSIS — N186 End stage renal disease: Secondary | ICD-10-CM | POA: Diagnosis not present

## 2017-06-21 DIAGNOSIS — N186 End stage renal disease: Secondary | ICD-10-CM | POA: Diagnosis not present

## 2017-06-21 DIAGNOSIS — D631 Anemia in chronic kidney disease: Secondary | ICD-10-CM | POA: Diagnosis not present

## 2017-06-21 DIAGNOSIS — N2581 Secondary hyperparathyroidism of renal origin: Secondary | ICD-10-CM | POA: Diagnosis not present

## 2017-06-24 DIAGNOSIS — N186 End stage renal disease: Secondary | ICD-10-CM | POA: Diagnosis not present

## 2017-06-24 DIAGNOSIS — N2581 Secondary hyperparathyroidism of renal origin: Secondary | ICD-10-CM | POA: Diagnosis not present

## 2017-06-24 DIAGNOSIS — D631 Anemia in chronic kidney disease: Secondary | ICD-10-CM | POA: Diagnosis not present

## 2017-06-25 DIAGNOSIS — H00021 Hordeolum internum right upper eyelid: Secondary | ICD-10-CM | POA: Diagnosis not present

## 2017-06-25 DIAGNOSIS — H25042 Posterior subcapsular polar age-related cataract, left eye: Secondary | ICD-10-CM | POA: Diagnosis not present

## 2017-06-25 DIAGNOSIS — H00024 Hordeolum internum left upper eyelid: Secondary | ICD-10-CM | POA: Diagnosis not present

## 2017-06-26 DIAGNOSIS — D631 Anemia in chronic kidney disease: Secondary | ICD-10-CM | POA: Diagnosis not present

## 2017-06-26 DIAGNOSIS — N2581 Secondary hyperparathyroidism of renal origin: Secondary | ICD-10-CM | POA: Diagnosis not present

## 2017-06-26 DIAGNOSIS — N186 End stage renal disease: Secondary | ICD-10-CM | POA: Diagnosis not present

## 2017-06-28 DIAGNOSIS — N2581 Secondary hyperparathyroidism of renal origin: Secondary | ICD-10-CM | POA: Diagnosis not present

## 2017-06-28 DIAGNOSIS — N186 End stage renal disease: Secondary | ICD-10-CM | POA: Diagnosis not present

## 2017-06-28 DIAGNOSIS — D631 Anemia in chronic kidney disease: Secondary | ICD-10-CM | POA: Diagnosis not present

## 2017-07-01 DIAGNOSIS — D631 Anemia in chronic kidney disease: Secondary | ICD-10-CM | POA: Diagnosis not present

## 2017-07-01 DIAGNOSIS — N2581 Secondary hyperparathyroidism of renal origin: Secondary | ICD-10-CM | POA: Diagnosis not present

## 2017-07-01 DIAGNOSIS — N186 End stage renal disease: Secondary | ICD-10-CM | POA: Diagnosis not present

## 2017-07-03 DIAGNOSIS — D631 Anemia in chronic kidney disease: Secondary | ICD-10-CM | POA: Diagnosis not present

## 2017-07-03 DIAGNOSIS — N2581 Secondary hyperparathyroidism of renal origin: Secondary | ICD-10-CM | POA: Diagnosis not present

## 2017-07-03 DIAGNOSIS — N186 End stage renal disease: Secondary | ICD-10-CM | POA: Diagnosis not present

## 2017-07-05 DIAGNOSIS — N2581 Secondary hyperparathyroidism of renal origin: Secondary | ICD-10-CM | POA: Diagnosis not present

## 2017-07-05 DIAGNOSIS — D631 Anemia in chronic kidney disease: Secondary | ICD-10-CM | POA: Diagnosis not present

## 2017-07-05 DIAGNOSIS — N186 End stage renal disease: Secondary | ICD-10-CM | POA: Diagnosis not present

## 2017-07-08 DIAGNOSIS — N186 End stage renal disease: Secondary | ICD-10-CM | POA: Diagnosis not present

## 2017-07-08 DIAGNOSIS — N2581 Secondary hyperparathyroidism of renal origin: Secondary | ICD-10-CM | POA: Diagnosis not present

## 2017-07-08 DIAGNOSIS — D631 Anemia in chronic kidney disease: Secondary | ICD-10-CM | POA: Diagnosis not present

## 2017-07-10 DIAGNOSIS — N2581 Secondary hyperparathyroidism of renal origin: Secondary | ICD-10-CM | POA: Diagnosis not present

## 2017-07-10 DIAGNOSIS — D631 Anemia in chronic kidney disease: Secondary | ICD-10-CM | POA: Diagnosis not present

## 2017-07-10 DIAGNOSIS — N186 End stage renal disease: Secondary | ICD-10-CM | POA: Diagnosis not present

## 2017-07-12 DIAGNOSIS — N2581 Secondary hyperparathyroidism of renal origin: Secondary | ICD-10-CM | POA: Diagnosis not present

## 2017-07-12 DIAGNOSIS — N186 End stage renal disease: Secondary | ICD-10-CM | POA: Diagnosis not present

## 2017-07-12 DIAGNOSIS — D631 Anemia in chronic kidney disease: Secondary | ICD-10-CM | POA: Diagnosis not present

## 2017-07-15 DIAGNOSIS — N186 End stage renal disease: Secondary | ICD-10-CM | POA: Diagnosis not present

## 2017-07-15 DIAGNOSIS — D631 Anemia in chronic kidney disease: Secondary | ICD-10-CM | POA: Diagnosis not present

## 2017-07-15 DIAGNOSIS — N2581 Secondary hyperparathyroidism of renal origin: Secondary | ICD-10-CM | POA: Diagnosis not present

## 2017-07-17 DIAGNOSIS — N186 End stage renal disease: Secondary | ICD-10-CM | POA: Diagnosis not present

## 2017-07-17 DIAGNOSIS — D631 Anemia in chronic kidney disease: Secondary | ICD-10-CM | POA: Diagnosis not present

## 2017-07-17 DIAGNOSIS — N2581 Secondary hyperparathyroidism of renal origin: Secondary | ICD-10-CM | POA: Diagnosis not present

## 2017-07-19 DIAGNOSIS — N2581 Secondary hyperparathyroidism of renal origin: Secondary | ICD-10-CM | POA: Diagnosis not present

## 2017-07-19 DIAGNOSIS — M3210 Systemic lupus erythematosus, organ or system involvement unspecified: Secondary | ICD-10-CM | POA: Diagnosis not present

## 2017-07-19 DIAGNOSIS — D631 Anemia in chronic kidney disease: Secondary | ICD-10-CM | POA: Diagnosis not present

## 2017-07-19 DIAGNOSIS — Z992 Dependence on renal dialysis: Secondary | ICD-10-CM | POA: Diagnosis not present

## 2017-07-19 DIAGNOSIS — N186 End stage renal disease: Secondary | ICD-10-CM | POA: Diagnosis not present

## 2017-07-20 DIAGNOSIS — Z992 Dependence on renal dialysis: Secondary | ICD-10-CM | POA: Diagnosis not present

## 2017-07-20 DIAGNOSIS — M3210 Systemic lupus erythematosus, organ or system involvement unspecified: Secondary | ICD-10-CM | POA: Diagnosis not present

## 2017-07-20 DIAGNOSIS — N186 End stage renal disease: Secondary | ICD-10-CM | POA: Diagnosis not present

## 2017-07-22 DIAGNOSIS — N186 End stage renal disease: Secondary | ICD-10-CM | POA: Diagnosis not present

## 2017-07-22 DIAGNOSIS — N2581 Secondary hyperparathyroidism of renal origin: Secondary | ICD-10-CM | POA: Diagnosis not present

## 2017-07-24 DIAGNOSIS — N2581 Secondary hyperparathyroidism of renal origin: Secondary | ICD-10-CM | POA: Diagnosis not present

## 2017-07-24 DIAGNOSIS — N186 End stage renal disease: Secondary | ICD-10-CM | POA: Diagnosis not present

## 2017-07-26 DIAGNOSIS — N186 End stage renal disease: Secondary | ICD-10-CM | POA: Diagnosis not present

## 2017-07-26 DIAGNOSIS — N2581 Secondary hyperparathyroidism of renal origin: Secondary | ICD-10-CM | POA: Diagnosis not present

## 2017-07-29 DIAGNOSIS — N2581 Secondary hyperparathyroidism of renal origin: Secondary | ICD-10-CM | POA: Diagnosis not present

## 2017-07-29 DIAGNOSIS — N186 End stage renal disease: Secondary | ICD-10-CM | POA: Diagnosis not present

## 2017-07-31 DIAGNOSIS — N2581 Secondary hyperparathyroidism of renal origin: Secondary | ICD-10-CM | POA: Diagnosis not present

## 2017-07-31 DIAGNOSIS — N186 End stage renal disease: Secondary | ICD-10-CM | POA: Diagnosis not present

## 2017-08-02 DIAGNOSIS — N186 End stage renal disease: Secondary | ICD-10-CM | POA: Diagnosis not present

## 2017-08-02 DIAGNOSIS — N2581 Secondary hyperparathyroidism of renal origin: Secondary | ICD-10-CM | POA: Diagnosis not present

## 2017-08-05 DIAGNOSIS — N186 End stage renal disease: Secondary | ICD-10-CM | POA: Diagnosis not present

## 2017-08-05 DIAGNOSIS — N2581 Secondary hyperparathyroidism of renal origin: Secondary | ICD-10-CM | POA: Diagnosis not present

## 2017-08-06 DIAGNOSIS — Z992 Dependence on renal dialysis: Secondary | ICD-10-CM | POA: Diagnosis not present

## 2017-08-06 DIAGNOSIS — T82858A Stenosis of vascular prosthetic devices, implants and grafts, initial encounter: Secondary | ICD-10-CM | POA: Diagnosis not present

## 2017-08-06 DIAGNOSIS — I871 Compression of vein: Secondary | ICD-10-CM | POA: Diagnosis not present

## 2017-08-06 DIAGNOSIS — N186 End stage renal disease: Secondary | ICD-10-CM | POA: Diagnosis not present

## 2017-08-07 DIAGNOSIS — N2581 Secondary hyperparathyroidism of renal origin: Secondary | ICD-10-CM | POA: Diagnosis not present

## 2017-08-07 DIAGNOSIS — N186 End stage renal disease: Secondary | ICD-10-CM | POA: Diagnosis not present

## 2017-08-09 DIAGNOSIS — N186 End stage renal disease: Secondary | ICD-10-CM | POA: Diagnosis not present

## 2017-08-09 DIAGNOSIS — D631 Anemia in chronic kidney disease: Secondary | ICD-10-CM | POA: Diagnosis not present

## 2017-08-09 DIAGNOSIS — Z992 Dependence on renal dialysis: Secondary | ICD-10-CM | POA: Diagnosis not present

## 2017-08-09 DIAGNOSIS — D509 Iron deficiency anemia, unspecified: Secondary | ICD-10-CM | POA: Diagnosis not present

## 2017-08-09 DIAGNOSIS — N2581 Secondary hyperparathyroidism of renal origin: Secondary | ICD-10-CM | POA: Diagnosis not present

## 2017-08-12 DIAGNOSIS — Z992 Dependence on renal dialysis: Secondary | ICD-10-CM | POA: Diagnosis not present

## 2017-08-12 DIAGNOSIS — N2581 Secondary hyperparathyroidism of renal origin: Secondary | ICD-10-CM | POA: Diagnosis not present

## 2017-08-12 DIAGNOSIS — D631 Anemia in chronic kidney disease: Secondary | ICD-10-CM | POA: Diagnosis not present

## 2017-08-12 DIAGNOSIS — D509 Iron deficiency anemia, unspecified: Secondary | ICD-10-CM | POA: Diagnosis not present

## 2017-08-12 DIAGNOSIS — N186 End stage renal disease: Secondary | ICD-10-CM | POA: Diagnosis not present

## 2017-08-14 DIAGNOSIS — Z992 Dependence on renal dialysis: Secondary | ICD-10-CM | POA: Diagnosis not present

## 2017-08-14 DIAGNOSIS — N186 End stage renal disease: Secondary | ICD-10-CM | POA: Diagnosis not present

## 2017-08-14 DIAGNOSIS — D509 Iron deficiency anemia, unspecified: Secondary | ICD-10-CM | POA: Diagnosis not present

## 2017-08-14 DIAGNOSIS — N2581 Secondary hyperparathyroidism of renal origin: Secondary | ICD-10-CM | POA: Diagnosis not present

## 2017-08-14 DIAGNOSIS — D631 Anemia in chronic kidney disease: Secondary | ICD-10-CM | POA: Diagnosis not present

## 2017-08-16 DIAGNOSIS — N2581 Secondary hyperparathyroidism of renal origin: Secondary | ICD-10-CM | POA: Diagnosis not present

## 2017-08-16 DIAGNOSIS — D509 Iron deficiency anemia, unspecified: Secondary | ICD-10-CM | POA: Diagnosis not present

## 2017-08-16 DIAGNOSIS — Z992 Dependence on renal dialysis: Secondary | ICD-10-CM | POA: Diagnosis not present

## 2017-08-16 DIAGNOSIS — N186 End stage renal disease: Secondary | ICD-10-CM | POA: Diagnosis not present

## 2017-08-16 DIAGNOSIS — D631 Anemia in chronic kidney disease: Secondary | ICD-10-CM | POA: Diagnosis not present

## 2017-08-19 DIAGNOSIS — Z992 Dependence on renal dialysis: Secondary | ICD-10-CM | POA: Diagnosis not present

## 2017-08-19 DIAGNOSIS — D631 Anemia in chronic kidney disease: Secondary | ICD-10-CM | POA: Diagnosis not present

## 2017-08-19 DIAGNOSIS — D509 Iron deficiency anemia, unspecified: Secondary | ICD-10-CM | POA: Diagnosis not present

## 2017-08-19 DIAGNOSIS — Z23 Encounter for immunization: Secondary | ICD-10-CM | POA: Diagnosis not present

## 2017-08-19 DIAGNOSIS — N186 End stage renal disease: Secondary | ICD-10-CM | POA: Diagnosis not present

## 2017-08-21 DIAGNOSIS — Z23 Encounter for immunization: Secondary | ICD-10-CM | POA: Diagnosis not present

## 2017-08-21 DIAGNOSIS — D509 Iron deficiency anemia, unspecified: Secondary | ICD-10-CM | POA: Diagnosis not present

## 2017-08-21 DIAGNOSIS — Z992 Dependence on renal dialysis: Secondary | ICD-10-CM | POA: Diagnosis not present

## 2017-08-21 DIAGNOSIS — N186 End stage renal disease: Secondary | ICD-10-CM | POA: Diagnosis not present

## 2017-08-21 DIAGNOSIS — D631 Anemia in chronic kidney disease: Secondary | ICD-10-CM | POA: Diagnosis not present

## 2017-08-23 DIAGNOSIS — D631 Anemia in chronic kidney disease: Secondary | ICD-10-CM | POA: Diagnosis not present

## 2017-08-23 DIAGNOSIS — D509 Iron deficiency anemia, unspecified: Secondary | ICD-10-CM | POA: Diagnosis not present

## 2017-08-23 DIAGNOSIS — Z992 Dependence on renal dialysis: Secondary | ICD-10-CM | POA: Diagnosis not present

## 2017-08-23 DIAGNOSIS — Z23 Encounter for immunization: Secondary | ICD-10-CM | POA: Diagnosis not present

## 2017-08-23 DIAGNOSIS — N186 End stage renal disease: Secondary | ICD-10-CM | POA: Diagnosis not present

## 2017-08-26 DIAGNOSIS — D509 Iron deficiency anemia, unspecified: Secondary | ICD-10-CM | POA: Diagnosis not present

## 2017-08-26 DIAGNOSIS — N186 End stage renal disease: Secondary | ICD-10-CM | POA: Diagnosis not present

## 2017-08-26 DIAGNOSIS — Z23 Encounter for immunization: Secondary | ICD-10-CM | POA: Diagnosis not present

## 2017-08-26 DIAGNOSIS — Z992 Dependence on renal dialysis: Secondary | ICD-10-CM | POA: Diagnosis not present

## 2017-08-26 DIAGNOSIS — D631 Anemia in chronic kidney disease: Secondary | ICD-10-CM | POA: Diagnosis not present

## 2017-08-28 DIAGNOSIS — N186 End stage renal disease: Secondary | ICD-10-CM | POA: Diagnosis not present

## 2017-08-28 DIAGNOSIS — Z23 Encounter for immunization: Secondary | ICD-10-CM | POA: Diagnosis not present

## 2017-08-28 DIAGNOSIS — Z992 Dependence on renal dialysis: Secondary | ICD-10-CM | POA: Diagnosis not present

## 2017-08-28 DIAGNOSIS — D509 Iron deficiency anemia, unspecified: Secondary | ICD-10-CM | POA: Diagnosis not present

## 2017-08-28 DIAGNOSIS — D631 Anemia in chronic kidney disease: Secondary | ICD-10-CM | POA: Diagnosis not present

## 2017-08-29 ENCOUNTER — Other Ambulatory Visit (INDEPENDENT_AMBULATORY_CARE_PROVIDER_SITE_OTHER): Payer: Medicare Other

## 2017-08-29 ENCOUNTER — Encounter: Payer: Self-pay | Admitting: Internal Medicine

## 2017-08-29 ENCOUNTER — Ambulatory Visit (INDEPENDENT_AMBULATORY_CARE_PROVIDER_SITE_OTHER): Payer: Medicare Other | Admitting: Internal Medicine

## 2017-08-29 VITALS — BP 150/100 | HR 76 | Temp 97.9°F | Ht 64.0 in | Wt 137.0 lb

## 2017-08-29 DIAGNOSIS — N186 End stage renal disease: Secondary | ICD-10-CM

## 2017-08-29 DIAGNOSIS — M3214 Glomerular disease in systemic lupus erythematosus: Secondary | ICD-10-CM | POA: Diagnosis not present

## 2017-08-29 DIAGNOSIS — R0600 Dyspnea, unspecified: Secondary | ICD-10-CM | POA: Diagnosis not present

## 2017-08-29 DIAGNOSIS — Z992 Dependence on renal dialysis: Secondary | ICD-10-CM

## 2017-08-29 LAB — BRAIN NATRIURETIC PEPTIDE: Pro B Natriuretic peptide (BNP): 428 pg/mL — ABNORMAL HIGH (ref 0.0–100.0)

## 2017-08-29 MED ORDER — TRAZODONE HCL 50 MG PO TABS: 25.0000 mg | ORAL_TABLET | Freq: Every day | ORAL | 3 refills | Status: DC

## 2017-08-29 MED ORDER — PREDNISONE 10 MG PO TABS: 10.0000 mg | ORAL_TABLET | Freq: Every day | ORAL | 3 refills | Status: DC

## 2017-08-29 MED ORDER — FUROSEMIDE 80 MG PO TABS: 80.0000 mg | ORAL_TABLET | Freq: Every day | ORAL | 3 refills | Status: DC

## 2017-08-29 NOTE — Progress Notes (Signed)
   Subjective:    Patient ID: [name], female    DOB: Aug 13, 1976, 41 y.o.   MRN: [id number]  HPI The patient is a 41 YO female coming in for left leg swelling. Started about 2 weeks ago. She does have ESRD caused by lupus. She did move to [place] about the same time and wonders if this is related. She has lupus flares which typically cause swelling and not pain. She also does not think that her new dialysis center is pulling as much fluid off her as before. She denies headaches or chills. No long car rides. No travel recently. She does still make a small amount of urine. No pain. She got compression stockings otc and has been wearing them with a small benefit.   Review of Systems  Constitutional: Negative.   Respiratory: Negative for cough, chest tightness and shortness of breath.   Cardiovascular: Positive for leg swelling. Negative for chest pain and palpitations.  Gastrointestinal: Negative for abdominal distention, abdominal pain, constipation, diarrhea, nausea and vomiting.  Musculoskeletal: Negative.   Skin: Negative.       Objective:   Physical Exam  Constitutional: She is oriented to person, place, and time. She appears well-developed and well-nourished.  HENT:  Head: Normocephalic and atraumatic.  Eyes: EOM are normal.  Neck: Normal range of motion.  Cardiovascular: Normal rate and regular rhythm.   Pulmonary/Chest: Effort normal and breath sounds normal. No respiratory distress. She has no wheezes. She has no rales.  Abdominal: Soft. She exhibits no distension. There is no tenderness. There is no rebound.  Musculoskeletal: She exhibits edema.  1+ pitting edema right leg, 2+ pitting edema to the knees left leg  Neurological: She is alert and oriented to person, place, and time. Coordination normal.  Skin: Skin is warm and dry.   Vitals:   08/29/17 0933  BP: (!) 150/100  Pulse: 76  Temp: 97.9 F (36.6 C)  TempSrc: Oral  SpO2: 100%  Weight: 137 lb (62.1 kg)    Height: 5\' 4"  (1.626 m)      Assessment & Plan:

## 2017-08-29 NOTE — Patient Instructions (Signed)
We will check the blood work today.   Start taking 2 pills of prednisone for 5 days, then take 1 and 1/2 pills for 3 days then go back to 1 pill daily.   We are sending in the furosemide to take 1 pill daily to see if this helps.

## 2017-08-29 NOTE — Assessment & Plan Note (Signed)
May need adjustment to dry weight. Rx for lasix 80 mg daily prn for the fluid. Checking BNP today.

## 2017-08-29 NOTE — Assessment & Plan Note (Signed)
Checking complement levels and advised doubling prednisone for 5 days to see if this is a flare.

## 2017-08-30 DIAGNOSIS — Z23 Encounter for immunization: Secondary | ICD-10-CM | POA: Diagnosis not present

## 2017-08-30 DIAGNOSIS — N186 End stage renal disease: Secondary | ICD-10-CM | POA: Diagnosis not present

## 2017-08-30 DIAGNOSIS — Z992 Dependence on renal dialysis: Secondary | ICD-10-CM | POA: Diagnosis not present

## 2017-08-30 DIAGNOSIS — D631 Anemia in chronic kidney disease: Secondary | ICD-10-CM | POA: Diagnosis not present

## 2017-08-30 DIAGNOSIS — D509 Iron deficiency anemia, unspecified: Secondary | ICD-10-CM | POA: Diagnosis not present

## 2017-08-30 LAB — C3 COMPLEMENT: C3 Complement: 138 mg/dL (ref 83–193)

## 2017-08-30 LAB — C4 COMPLEMENT: C4 Complement: 23 mg/dL (ref 15–57)

## 2017-09-02 DIAGNOSIS — D509 Iron deficiency anemia, unspecified: Secondary | ICD-10-CM | POA: Diagnosis not present

## 2017-09-02 DIAGNOSIS — N186 End stage renal disease: Secondary | ICD-10-CM | POA: Diagnosis not present

## 2017-09-02 DIAGNOSIS — D631 Anemia in chronic kidney disease: Secondary | ICD-10-CM | POA: Diagnosis not present

## 2017-09-02 DIAGNOSIS — Z992 Dependence on renal dialysis: Secondary | ICD-10-CM | POA: Diagnosis not present

## 2017-09-02 DIAGNOSIS — Z23 Encounter for immunization: Secondary | ICD-10-CM | POA: Diagnosis not present

## 2017-09-04 DIAGNOSIS — Z23 Encounter for immunization: Secondary | ICD-10-CM | POA: Diagnosis not present

## 2017-09-04 DIAGNOSIS — N186 End stage renal disease: Secondary | ICD-10-CM | POA: Diagnosis not present

## 2017-09-04 DIAGNOSIS — Z992 Dependence on renal dialysis: Secondary | ICD-10-CM | POA: Diagnosis not present

## 2017-09-04 DIAGNOSIS — D631 Anemia in chronic kidney disease: Secondary | ICD-10-CM | POA: Diagnosis not present

## 2017-09-04 DIAGNOSIS — D509 Iron deficiency anemia, unspecified: Secondary | ICD-10-CM | POA: Diagnosis not present

## 2017-09-06 DIAGNOSIS — Z23 Encounter for immunization: Secondary | ICD-10-CM | POA: Diagnosis not present

## 2017-09-06 DIAGNOSIS — D631 Anemia in chronic kidney disease: Secondary | ICD-10-CM | POA: Diagnosis not present

## 2017-09-06 DIAGNOSIS — Z992 Dependence on renal dialysis: Secondary | ICD-10-CM | POA: Diagnosis not present

## 2017-09-06 DIAGNOSIS — D509 Iron deficiency anemia, unspecified: Secondary | ICD-10-CM | POA: Diagnosis not present

## 2017-09-06 DIAGNOSIS — N186 End stage renal disease: Secondary | ICD-10-CM | POA: Diagnosis not present

## 2017-09-09 DIAGNOSIS — Z992 Dependence on renal dialysis: Secondary | ICD-10-CM | POA: Diagnosis not present

## 2017-09-09 DIAGNOSIS — D631 Anemia in chronic kidney disease: Secondary | ICD-10-CM | POA: Diagnosis not present

## 2017-09-09 DIAGNOSIS — Z23 Encounter for immunization: Secondary | ICD-10-CM | POA: Diagnosis not present

## 2017-09-09 DIAGNOSIS — N186 End stage renal disease: Secondary | ICD-10-CM | POA: Diagnosis not present

## 2017-09-09 DIAGNOSIS — D509 Iron deficiency anemia, unspecified: Secondary | ICD-10-CM | POA: Diagnosis not present

## 2017-09-10 DIAGNOSIS — E8770 Fluid overload, unspecified: Secondary | ICD-10-CM | POA: Diagnosis not present

## 2017-09-10 DIAGNOSIS — N186 End stage renal disease: Secondary | ICD-10-CM | POA: Diagnosis not present

## 2017-09-10 DIAGNOSIS — Z992 Dependence on renal dialysis: Secondary | ICD-10-CM | POA: Diagnosis not present

## 2017-09-10 DIAGNOSIS — E8779 Other fluid overload: Secondary | ICD-10-CM | POA: Diagnosis not present

## 2017-09-11 DIAGNOSIS — N186 End stage renal disease: Secondary | ICD-10-CM | POA: Diagnosis not present

## 2017-09-11 DIAGNOSIS — Z992 Dependence on renal dialysis: Secondary | ICD-10-CM | POA: Diagnosis not present

## 2017-09-11 DIAGNOSIS — D631 Anemia in chronic kidney disease: Secondary | ICD-10-CM | POA: Diagnosis not present

## 2017-09-11 DIAGNOSIS — Z23 Encounter for immunization: Secondary | ICD-10-CM | POA: Diagnosis not present

## 2017-09-11 DIAGNOSIS — D509 Iron deficiency anemia, unspecified: Secondary | ICD-10-CM | POA: Diagnosis not present

## 2017-09-14 DIAGNOSIS — Z992 Dependence on renal dialysis: Secondary | ICD-10-CM | POA: Diagnosis not present

## 2017-09-14 DIAGNOSIS — D631 Anemia in chronic kidney disease: Secondary | ICD-10-CM | POA: Diagnosis not present

## 2017-09-14 DIAGNOSIS — D509 Iron deficiency anemia, unspecified: Secondary | ICD-10-CM | POA: Diagnosis not present

## 2017-09-14 DIAGNOSIS — N186 End stage renal disease: Secondary | ICD-10-CM | POA: Diagnosis not present

## 2017-09-14 DIAGNOSIS — Z23 Encounter for immunization: Secondary | ICD-10-CM | POA: Diagnosis not present

## 2017-09-17 DIAGNOSIS — Z992 Dependence on renal dialysis: Secondary | ICD-10-CM | POA: Diagnosis not present

## 2017-09-17 DIAGNOSIS — D509 Iron deficiency anemia, unspecified: Secondary | ICD-10-CM | POA: Diagnosis not present

## 2017-09-17 DIAGNOSIS — D631 Anemia in chronic kidney disease: Secondary | ICD-10-CM | POA: Diagnosis not present

## 2017-09-17 DIAGNOSIS — Z23 Encounter for immunization: Secondary | ICD-10-CM | POA: Diagnosis not present

## 2017-09-17 DIAGNOSIS — N186 End stage renal disease: Secondary | ICD-10-CM | POA: Diagnosis not present

## 2017-09-19 DIAGNOSIS — Z23 Encounter for immunization: Secondary | ICD-10-CM | POA: Diagnosis not present

## 2017-09-19 DIAGNOSIS — N186 End stage renal disease: Secondary | ICD-10-CM | POA: Diagnosis not present

## 2017-09-19 DIAGNOSIS — Z992 Dependence on renal dialysis: Secondary | ICD-10-CM | POA: Diagnosis not present

## 2017-09-19 DIAGNOSIS — D509 Iron deficiency anemia, unspecified: Secondary | ICD-10-CM | POA: Diagnosis not present

## 2017-09-19 DIAGNOSIS — D631 Anemia in chronic kidney disease: Secondary | ICD-10-CM | POA: Diagnosis not present

## 2017-09-20 DIAGNOSIS — M9080 Osteopathy in diseases classified elsewhere, unspecified site: Secondary | ICD-10-CM | POA: Insufficient documentation

## 2017-09-20 DIAGNOSIS — E889 Metabolic disorder, unspecified: Secondary | ICD-10-CM | POA: Insufficient documentation

## 2017-09-21 DIAGNOSIS — D631 Anemia in chronic kidney disease: Secondary | ICD-10-CM | POA: Diagnosis not present

## 2017-09-21 DIAGNOSIS — N186 End stage renal disease: Secondary | ICD-10-CM | POA: Diagnosis not present

## 2017-09-21 DIAGNOSIS — Z23 Encounter for immunization: Secondary | ICD-10-CM | POA: Diagnosis not present

## 2017-09-21 DIAGNOSIS — D509 Iron deficiency anemia, unspecified: Secondary | ICD-10-CM | POA: Diagnosis not present

## 2017-09-21 DIAGNOSIS — Z992 Dependence on renal dialysis: Secondary | ICD-10-CM | POA: Diagnosis not present

## 2017-09-24 DIAGNOSIS — D631 Anemia in chronic kidney disease: Secondary | ICD-10-CM | POA: Diagnosis not present

## 2017-09-24 DIAGNOSIS — Z23 Encounter for immunization: Secondary | ICD-10-CM | POA: Diagnosis not present

## 2017-09-24 DIAGNOSIS — D509 Iron deficiency anemia, unspecified: Secondary | ICD-10-CM | POA: Diagnosis not present

## 2017-09-24 DIAGNOSIS — Z992 Dependence on renal dialysis: Secondary | ICD-10-CM | POA: Diagnosis not present

## 2017-09-24 DIAGNOSIS — N186 End stage renal disease: Secondary | ICD-10-CM | POA: Diagnosis not present

## 2017-09-26 DIAGNOSIS — Z23 Encounter for immunization: Secondary | ICD-10-CM | POA: Diagnosis not present

## 2017-09-26 DIAGNOSIS — D509 Iron deficiency anemia, unspecified: Secondary | ICD-10-CM | POA: Diagnosis not present

## 2017-09-26 DIAGNOSIS — N186 End stage renal disease: Secondary | ICD-10-CM | POA: Diagnosis not present

## 2017-09-26 DIAGNOSIS — D631 Anemia in chronic kidney disease: Secondary | ICD-10-CM | POA: Diagnosis not present

## 2017-09-26 DIAGNOSIS — Z992 Dependence on renal dialysis: Secondary | ICD-10-CM | POA: Diagnosis not present

## 2017-09-28 DIAGNOSIS — D509 Iron deficiency anemia, unspecified: Secondary | ICD-10-CM | POA: Diagnosis not present

## 2017-09-28 DIAGNOSIS — Z23 Encounter for immunization: Secondary | ICD-10-CM | POA: Diagnosis not present

## 2017-09-28 DIAGNOSIS — Z992 Dependence on renal dialysis: Secondary | ICD-10-CM | POA: Diagnosis not present

## 2017-09-28 DIAGNOSIS — N186 End stage renal disease: Secondary | ICD-10-CM | POA: Diagnosis not present

## 2017-09-28 DIAGNOSIS — D631 Anemia in chronic kidney disease: Secondary | ICD-10-CM | POA: Diagnosis not present

## 2017-10-01 DIAGNOSIS — N186 End stage renal disease: Secondary | ICD-10-CM | POA: Diagnosis not present

## 2017-10-01 DIAGNOSIS — Z23 Encounter for immunization: Secondary | ICD-10-CM | POA: Diagnosis not present

## 2017-10-01 DIAGNOSIS — D631 Anemia in chronic kidney disease: Secondary | ICD-10-CM | POA: Diagnosis not present

## 2017-10-01 DIAGNOSIS — Z992 Dependence on renal dialysis: Secondary | ICD-10-CM | POA: Diagnosis not present

## 2017-10-01 DIAGNOSIS — D509 Iron deficiency anemia, unspecified: Secondary | ICD-10-CM | POA: Diagnosis not present

## 2017-10-03 DIAGNOSIS — D509 Iron deficiency anemia, unspecified: Secondary | ICD-10-CM | POA: Diagnosis not present

## 2017-10-03 DIAGNOSIS — Z23 Encounter for immunization: Secondary | ICD-10-CM | POA: Diagnosis not present

## 2017-10-03 DIAGNOSIS — N186 End stage renal disease: Secondary | ICD-10-CM | POA: Diagnosis not present

## 2017-10-03 DIAGNOSIS — D631 Anemia in chronic kidney disease: Secondary | ICD-10-CM | POA: Diagnosis not present

## 2017-10-03 DIAGNOSIS — Z992 Dependence on renal dialysis: Secondary | ICD-10-CM | POA: Diagnosis not present

## 2017-10-05 DIAGNOSIS — D631 Anemia in chronic kidney disease: Secondary | ICD-10-CM | POA: Diagnosis not present

## 2017-10-05 DIAGNOSIS — Z23 Encounter for immunization: Secondary | ICD-10-CM | POA: Diagnosis not present

## 2017-10-05 DIAGNOSIS — N186 End stage renal disease: Secondary | ICD-10-CM | POA: Diagnosis not present

## 2017-10-05 DIAGNOSIS — D509 Iron deficiency anemia, unspecified: Secondary | ICD-10-CM | POA: Diagnosis not present

## 2017-10-05 DIAGNOSIS — Z992 Dependence on renal dialysis: Secondary | ICD-10-CM | POA: Diagnosis not present

## 2017-10-08 DIAGNOSIS — D631 Anemia in chronic kidney disease: Secondary | ICD-10-CM | POA: Diagnosis not present

## 2017-10-08 DIAGNOSIS — Z23 Encounter for immunization: Secondary | ICD-10-CM | POA: Diagnosis not present

## 2017-10-08 DIAGNOSIS — D509 Iron deficiency anemia, unspecified: Secondary | ICD-10-CM | POA: Diagnosis not present

## 2017-10-08 DIAGNOSIS — Z992 Dependence on renal dialysis: Secondary | ICD-10-CM | POA: Diagnosis not present

## 2017-10-08 DIAGNOSIS — N186 End stage renal disease: Secondary | ICD-10-CM | POA: Diagnosis not present

## 2017-10-09 DIAGNOSIS — N19 Unspecified kidney failure: Secondary | ICD-10-CM | POA: Diagnosis not present

## 2017-10-09 DIAGNOSIS — G4700 Insomnia, unspecified: Secondary | ICD-10-CM | POA: Diagnosis not present

## 2017-10-09 DIAGNOSIS — Z6824 Body mass index (BMI) 24.0-24.9, adult: Secondary | ICD-10-CM | POA: Diagnosis not present

## 2017-10-09 DIAGNOSIS — Z789 Other specified health status: Secondary | ICD-10-CM | POA: Diagnosis not present

## 2017-10-09 DIAGNOSIS — K219 Gastro-esophageal reflux disease without esophagitis: Secondary | ICD-10-CM | POA: Diagnosis not present

## 2017-10-09 DIAGNOSIS — Z299 Encounter for prophylactic measures, unspecified: Secondary | ICD-10-CM | POA: Diagnosis not present

## 2017-10-09 DIAGNOSIS — I10 Essential (primary) hypertension: Secondary | ICD-10-CM | POA: Diagnosis not present

## 2017-10-09 DIAGNOSIS — L930 Discoid lupus erythematosus: Secondary | ICD-10-CM | POA: Diagnosis not present

## 2017-10-10 DIAGNOSIS — N186 End stage renal disease: Secondary | ICD-10-CM | POA: Diagnosis not present

## 2017-10-10 DIAGNOSIS — Z23 Encounter for immunization: Secondary | ICD-10-CM | POA: Diagnosis not present

## 2017-10-10 DIAGNOSIS — D509 Iron deficiency anemia, unspecified: Secondary | ICD-10-CM | POA: Diagnosis not present

## 2017-10-10 DIAGNOSIS — D631 Anemia in chronic kidney disease: Secondary | ICD-10-CM | POA: Diagnosis not present

## 2017-10-10 DIAGNOSIS — Z992 Dependence on renal dialysis: Secondary | ICD-10-CM | POA: Diagnosis not present

## 2017-10-12 DIAGNOSIS — N186 End stage renal disease: Secondary | ICD-10-CM | POA: Diagnosis not present

## 2017-10-12 DIAGNOSIS — Z992 Dependence on renal dialysis: Secondary | ICD-10-CM | POA: Diagnosis not present

## 2017-10-12 DIAGNOSIS — D631 Anemia in chronic kidney disease: Secondary | ICD-10-CM | POA: Diagnosis not present

## 2017-10-12 DIAGNOSIS — Z23 Encounter for immunization: Secondary | ICD-10-CM | POA: Diagnosis not present

## 2017-10-12 DIAGNOSIS — D509 Iron deficiency anemia, unspecified: Secondary | ICD-10-CM | POA: Diagnosis not present

## 2017-10-15 DIAGNOSIS — Z992 Dependence on renal dialysis: Secondary | ICD-10-CM | POA: Diagnosis not present

## 2017-10-15 DIAGNOSIS — Z23 Encounter for immunization: Secondary | ICD-10-CM | POA: Diagnosis not present

## 2017-10-15 DIAGNOSIS — D631 Anemia in chronic kidney disease: Secondary | ICD-10-CM | POA: Diagnosis not present

## 2017-10-15 DIAGNOSIS — D509 Iron deficiency anemia, unspecified: Secondary | ICD-10-CM | POA: Diagnosis not present

## 2017-10-15 DIAGNOSIS — N186 End stage renal disease: Secondary | ICD-10-CM | POA: Diagnosis not present

## 2017-10-17 DIAGNOSIS — Z992 Dependence on renal dialysis: Secondary | ICD-10-CM | POA: Diagnosis not present

## 2017-10-17 DIAGNOSIS — D631 Anemia in chronic kidney disease: Secondary | ICD-10-CM | POA: Diagnosis not present

## 2017-10-17 DIAGNOSIS — Z23 Encounter for immunization: Secondary | ICD-10-CM | POA: Diagnosis not present

## 2017-10-17 DIAGNOSIS — N186 End stage renal disease: Secondary | ICD-10-CM | POA: Diagnosis not present

## 2017-10-17 DIAGNOSIS — D509 Iron deficiency anemia, unspecified: Secondary | ICD-10-CM | POA: Diagnosis not present

## 2017-10-18 DIAGNOSIS — Z992 Dependence on renal dialysis: Secondary | ICD-10-CM | POA: Diagnosis not present

## 2017-10-18 DIAGNOSIS — N186 End stage renal disease: Secondary | ICD-10-CM | POA: Diagnosis not present

## 2017-10-19 DIAGNOSIS — Z992 Dependence on renal dialysis: Secondary | ICD-10-CM | POA: Diagnosis not present

## 2017-10-19 DIAGNOSIS — D631 Anemia in chronic kidney disease: Secondary | ICD-10-CM | POA: Diagnosis not present

## 2017-10-19 DIAGNOSIS — N2581 Secondary hyperparathyroidism of renal origin: Secondary | ICD-10-CM | POA: Diagnosis not present

## 2017-10-19 DIAGNOSIS — N186 End stage renal disease: Secondary | ICD-10-CM | POA: Diagnosis not present

## 2017-10-21 ENCOUNTER — Other Ambulatory Visit: Payer: Self-pay

## 2017-10-21 DIAGNOSIS — T82510A Breakdown (mechanical) of surgically created arteriovenous fistula, initial encounter: Secondary | ICD-10-CM

## 2017-10-21 DIAGNOSIS — Z7689 Persons encountering health services in other specified circumstances: Secondary | ICD-10-CM | POA: Diagnosis not present

## 2017-10-21 DIAGNOSIS — Z8719 Personal history of other diseases of the digestive system: Secondary | ICD-10-CM | POA: Insufficient documentation

## 2017-10-22 DIAGNOSIS — D631 Anemia in chronic kidney disease: Secondary | ICD-10-CM | POA: Diagnosis not present

## 2017-10-22 DIAGNOSIS — Z992 Dependence on renal dialysis: Secondary | ICD-10-CM | POA: Diagnosis not present

## 2017-10-22 DIAGNOSIS — N186 End stage renal disease: Secondary | ICD-10-CM | POA: Diagnosis not present

## 2017-10-22 DIAGNOSIS — N2581 Secondary hyperparathyroidism of renal origin: Secondary | ICD-10-CM | POA: Diagnosis not present

## 2017-10-24 DIAGNOSIS — N186 End stage renal disease: Secondary | ICD-10-CM | POA: Diagnosis not present

## 2017-10-24 DIAGNOSIS — N2581 Secondary hyperparathyroidism of renal origin: Secondary | ICD-10-CM | POA: Diagnosis not present

## 2017-10-24 DIAGNOSIS — Z992 Dependence on renal dialysis: Secondary | ICD-10-CM | POA: Diagnosis not present

## 2017-10-24 DIAGNOSIS — D631 Anemia in chronic kidney disease: Secondary | ICD-10-CM | POA: Diagnosis not present

## 2017-10-26 DIAGNOSIS — Z992 Dependence on renal dialysis: Secondary | ICD-10-CM | POA: Diagnosis not present

## 2017-10-26 DIAGNOSIS — N2581 Secondary hyperparathyroidism of renal origin: Secondary | ICD-10-CM | POA: Diagnosis not present

## 2017-10-26 DIAGNOSIS — N186 End stage renal disease: Secondary | ICD-10-CM | POA: Diagnosis not present

## 2017-10-26 DIAGNOSIS — D631 Anemia in chronic kidney disease: Secondary | ICD-10-CM | POA: Diagnosis not present

## 2017-10-29 DIAGNOSIS — N186 End stage renal disease: Secondary | ICD-10-CM | POA: Diagnosis not present

## 2017-10-29 DIAGNOSIS — Z992 Dependence on renal dialysis: Secondary | ICD-10-CM | POA: Diagnosis not present

## 2017-10-29 DIAGNOSIS — N2581 Secondary hyperparathyroidism of renal origin: Secondary | ICD-10-CM | POA: Diagnosis not present

## 2017-10-29 DIAGNOSIS — D631 Anemia in chronic kidney disease: Secondary | ICD-10-CM | POA: Diagnosis not present

## 2017-10-31 DIAGNOSIS — N186 End stage renal disease: Secondary | ICD-10-CM | POA: Diagnosis not present

## 2017-10-31 DIAGNOSIS — D631 Anemia in chronic kidney disease: Secondary | ICD-10-CM | POA: Diagnosis not present

## 2017-10-31 DIAGNOSIS — Z992 Dependence on renal dialysis: Secondary | ICD-10-CM | POA: Diagnosis not present

## 2017-10-31 DIAGNOSIS — N2581 Secondary hyperparathyroidism of renal origin: Secondary | ICD-10-CM | POA: Diagnosis not present

## 2017-11-02 DIAGNOSIS — D631 Anemia in chronic kidney disease: Secondary | ICD-10-CM | POA: Diagnosis not present

## 2017-11-02 DIAGNOSIS — N186 End stage renal disease: Secondary | ICD-10-CM | POA: Diagnosis not present

## 2017-11-02 DIAGNOSIS — Z992 Dependence on renal dialysis: Secondary | ICD-10-CM | POA: Diagnosis not present

## 2017-11-02 DIAGNOSIS — R531 Weakness: Secondary | ICD-10-CM | POA: Diagnosis not present

## 2017-11-02 DIAGNOSIS — R0602 Shortness of breath: Secondary | ICD-10-CM | POA: Diagnosis not present

## 2017-11-02 DIAGNOSIS — R5383 Other fatigue: Secondary | ICD-10-CM | POA: Diagnosis not present

## 2017-11-02 DIAGNOSIS — N2581 Secondary hyperparathyroidism of renal origin: Secondary | ICD-10-CM | POA: Diagnosis not present

## 2017-11-05 DIAGNOSIS — N186 End stage renal disease: Secondary | ICD-10-CM | POA: Diagnosis not present

## 2017-11-05 DIAGNOSIS — D631 Anemia in chronic kidney disease: Secondary | ICD-10-CM | POA: Diagnosis not present

## 2017-11-05 DIAGNOSIS — N2581 Secondary hyperparathyroidism of renal origin: Secondary | ICD-10-CM | POA: Diagnosis not present

## 2017-11-05 DIAGNOSIS — Z992 Dependence on renal dialysis: Secondary | ICD-10-CM | POA: Diagnosis not present

## 2017-11-05 IMAGING — DX DG CHEST 1V PORT
1 series · 1 of 1 positions shown · non-contrast
Comparison: 01/26/2014

CLINICAL DATA: Dizziness.

EXAM:
PORTABLE CHEST 1 VIEW

[chest ap]
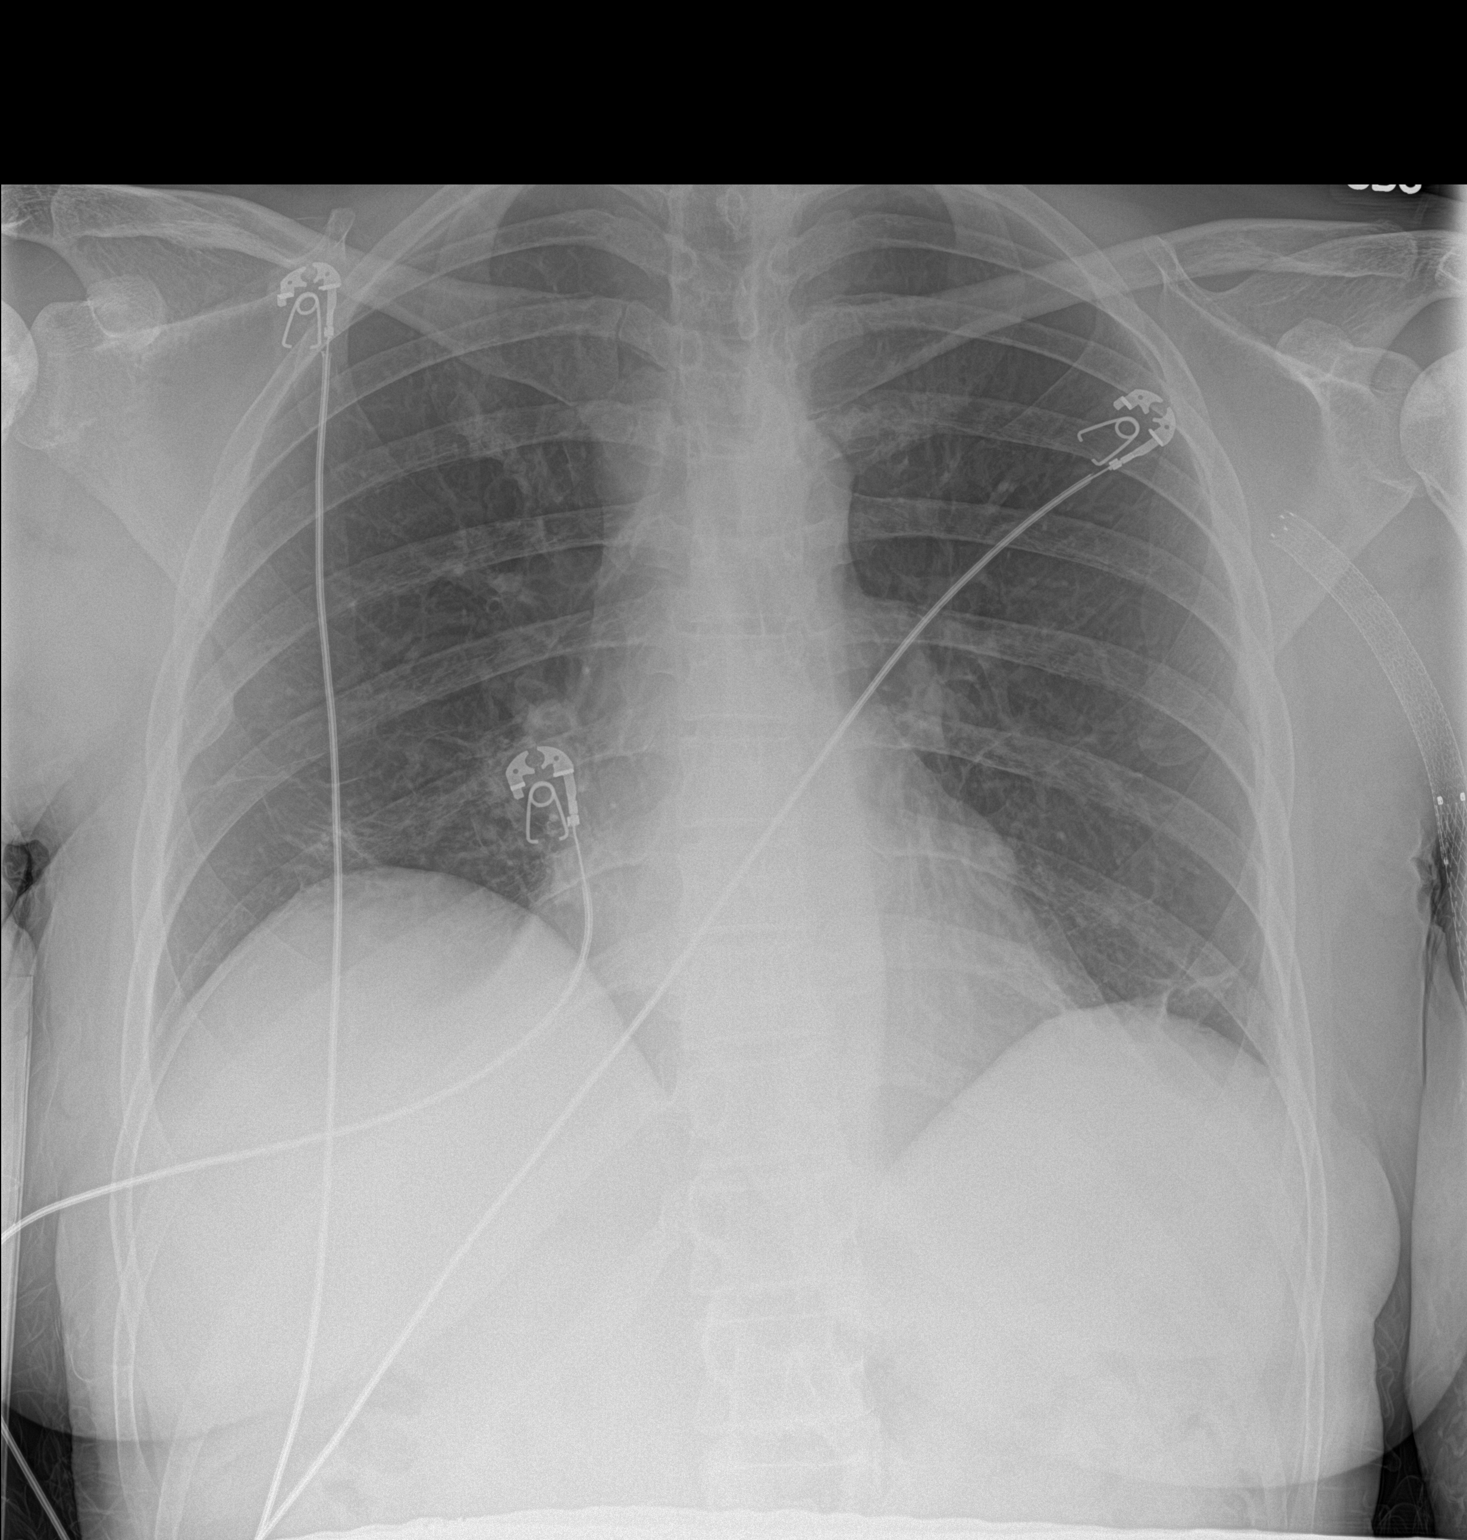

[1 of 1 positions shown; findings below may reference images not displayed]

FINDINGS: Chronic scarring at the bases. There is no edema, consolidation,
effusion, or pneumothorax. Normal heart size and mediastinal
contours. Left upper extremity stenting in this patient with
end-stage renal disease.
IMPRESSION: 1. No active disease.
2. Mild basilar scarring, stable from 7057.

## 2017-11-07 DIAGNOSIS — N2581 Secondary hyperparathyroidism of renal origin: Secondary | ICD-10-CM | POA: Diagnosis not present

## 2017-11-07 DIAGNOSIS — N186 End stage renal disease: Secondary | ICD-10-CM | POA: Diagnosis not present

## 2017-11-07 DIAGNOSIS — D631 Anemia in chronic kidney disease: Secondary | ICD-10-CM | POA: Diagnosis not present

## 2017-11-07 DIAGNOSIS — Z992 Dependence on renal dialysis: Secondary | ICD-10-CM | POA: Diagnosis not present

## 2017-11-09 DIAGNOSIS — N186 End stage renal disease: Secondary | ICD-10-CM | POA: Diagnosis not present

## 2017-11-09 DIAGNOSIS — Z992 Dependence on renal dialysis: Secondary | ICD-10-CM | POA: Diagnosis not present

## 2017-11-09 DIAGNOSIS — D631 Anemia in chronic kidney disease: Secondary | ICD-10-CM | POA: Diagnosis not present

## 2017-11-09 DIAGNOSIS — N2581 Secondary hyperparathyroidism of renal origin: Secondary | ICD-10-CM | POA: Diagnosis not present

## 2017-11-11 DIAGNOSIS — D631 Anemia in chronic kidney disease: Secondary | ICD-10-CM | POA: Diagnosis not present

## 2017-11-11 DIAGNOSIS — N186 End stage renal disease: Secondary | ICD-10-CM | POA: Diagnosis not present

## 2017-11-11 DIAGNOSIS — N2581 Secondary hyperparathyroidism of renal origin: Secondary | ICD-10-CM | POA: Diagnosis not present

## 2017-11-11 DIAGNOSIS — Z992 Dependence on renal dialysis: Secondary | ICD-10-CM | POA: Diagnosis not present

## 2017-11-14 DIAGNOSIS — Z992 Dependence on renal dialysis: Secondary | ICD-10-CM | POA: Diagnosis not present

## 2017-11-14 DIAGNOSIS — N186 End stage renal disease: Secondary | ICD-10-CM | POA: Diagnosis not present

## 2017-11-14 DIAGNOSIS — D631 Anemia in chronic kidney disease: Secondary | ICD-10-CM | POA: Diagnosis not present

## 2017-11-14 DIAGNOSIS — N2581 Secondary hyperparathyroidism of renal origin: Secondary | ICD-10-CM | POA: Diagnosis not present

## 2017-11-16 DIAGNOSIS — D631 Anemia in chronic kidney disease: Secondary | ICD-10-CM | POA: Diagnosis not present

## 2017-11-16 DIAGNOSIS — N186 End stage renal disease: Secondary | ICD-10-CM | POA: Diagnosis not present

## 2017-11-16 DIAGNOSIS — N2581 Secondary hyperparathyroidism of renal origin: Secondary | ICD-10-CM | POA: Diagnosis not present

## 2017-11-16 DIAGNOSIS — Z992 Dependence on renal dialysis: Secondary | ICD-10-CM | POA: Diagnosis not present

## 2017-11-18 DIAGNOSIS — N186 End stage renal disease: Secondary | ICD-10-CM | POA: Diagnosis not present

## 2017-11-18 DIAGNOSIS — Z992 Dependence on renal dialysis: Secondary | ICD-10-CM | POA: Diagnosis not present

## 2017-11-18 DIAGNOSIS — D631 Anemia in chronic kidney disease: Secondary | ICD-10-CM | POA: Diagnosis not present

## 2017-11-18 DIAGNOSIS — N2581 Secondary hyperparathyroidism of renal origin: Secondary | ICD-10-CM | POA: Diagnosis not present

## 2017-11-20 ENCOUNTER — Other Ambulatory Visit: Payer: Self-pay

## 2017-11-20 ENCOUNTER — Ambulatory Visit (INDEPENDENT_AMBULATORY_CARE_PROVIDER_SITE_OTHER): Payer: Medicare Other | Admitting: Family Medicine

## 2017-11-20 ENCOUNTER — Encounter: Payer: Self-pay | Admitting: Family Medicine

## 2017-11-20 VITALS — BP 182/100 | HR 100 | Temp 98.2°F | Resp 16 | Ht 64.0 in | Wt 146.0 lb

## 2017-11-20 DIAGNOSIS — I10 Essential (primary) hypertension: Secondary | ICD-10-CM | POA: Diagnosis not present

## 2017-11-20 DIAGNOSIS — D696 Thrombocytopenia, unspecified: Secondary | ICD-10-CM

## 2017-11-20 DIAGNOSIS — Z992 Dependence on renal dialysis: Secondary | ICD-10-CM

## 2017-11-20 DIAGNOSIS — R002 Palpitations: Secondary | ICD-10-CM

## 2017-11-20 DIAGNOSIS — R9431 Abnormal electrocardiogram [ECG] [EKG]: Secondary | ICD-10-CM

## 2017-11-20 DIAGNOSIS — M328 Other forms of systemic lupus erythematosus: Secondary | ICD-10-CM | POA: Diagnosis not present

## 2017-11-20 DIAGNOSIS — N2581 Secondary hyperparathyroidism of renal origin: Secondary | ICD-10-CM | POA: Diagnosis not present

## 2017-11-20 DIAGNOSIS — N186 End stage renal disease: Secondary | ICD-10-CM

## 2017-11-20 NOTE — Patient Instructions (Addendum)
Your EKG shows some changes; new right bundle branch block with sinus arrhythmia I have ordered a Holter monitor to look for palpitations. I have ordered a cardiology consultation.  Your blood pressure is still very high.  I recommend you take 2 of your Cardizem (diltiazem) tonight.  Your blood pressure will be checked tomorrow during dialysis.  No changes in medicines.  Make sure you take an updated medicine list to your dialysis visit [name] at the dialysis center says they are unaware of your paroxetine and buspirone prescriptions.  Please ask your dialysis doctors about other immunizations such as the new shingles shot, and a Prevnar booster.  See me every 3 months.  Come sooner if you have problems.

## 2017-11-20 NOTE — Progress Notes (Signed)
Chief Complaint  Patient presents with  . Hypertension   New Patient Moved to [facility] 2 months ago to be closer to family She is on dialysis and was denied for transplant due to financial reasons and needed more social support She previously was cared by Dr Sharlet Salina at [facility] in Internal medicine.  Is happy with her care, it is too far away for regular visits Goes to the dialysis center in [place] 3 days a week She feels that the center "is not taking enough fluid off" because her BP is high and her ankles are swollen. Her BP is 180/100 today, and I called the Nephrologist.  Will double the diltiazem and she will go there tomorrow. She has a very complicated medical history, and this is reviewed with her. She is up to date with PAP, mammogram, but not all immunizations.  I will contact the nephrology about updates on shingles and prevnar. She has recent problems with palpitations.  It happens randomly.  She breaks into a sweat.  She feels somewhat anxious.  Does not know if the anxiety causes palpitations or the palpitations come from the anxiety.  She has been prescribed paroxetine for the anxiety.  In addition she has been prescribed BuSpar.  I have concerns because BuSpar is listed as a drug not to be used with end-stage renal failure.  I did call this to the attention of her nephrologist.  She will discuss it with him tomorrow.  Perhaps she needs a psychiatry referral and she is agreeable to this if needed.      Patient Active Problem List   Diagnosis Date Noted  . Heart palpitations 11/20/2017  . GERD (gastroesophageal reflux disease) 07/10/2016  . Thrombocytopenia ([place]) 03/17/2016  . Duodenal ulcer hemorrhagic   . Esophageal stricture   . Adjustment disorder with mixed anxiety and depressed mood 10/25/2015  . Lupus ([place]) 04/29/2015  . ESRD on dialysis ([place]) 10/09/2013  . Anemia in chronic kidney disease 10/09/2013  . Secondary renal hyperparathyroidism ([place]) 10/09/2013  . Essential  hypertension 10/09/2013    Outpatient Encounter Medications as of 11/20/2017  Medication Sig  . acetaminophen (TYLENOL) 500 MG tablet Take 500 mg by mouth as needed for headache.   . busPIRone (BUSPAR) 5 MG tablet Take 5 mg by mouth.  . calcium acetate (PHOSLO) 667 MG capsule Take 1,334 mg by mouth 3 (three) times daily with meals.   Marland Kitchen diltiazem (CARDIZEM CD) 120 MG 24 hr capsule   . lisinopril (PRINIVIL,ZESTRIL) 40 MG tablet   . multivitamin (RENA-VIT) TABS tablet Take 1 tablet by mouth at bedtime.  . pantoprazole (PROTONIX) 40 MG tablet take 1 tablet by mouth once daily  . PARoxetine (PAXIL) 20 MG tablet Take 20 mg by mouth daily.  . predniSONE (DELTASONE) 10 MG tablet Take 1 tablet (10 mg total) by mouth daily with breakfast.  . sevelamer carbonate (RENVELA) 800 MG tablet Take 800 mg by mouth 3 (three) times daily with meals.  . traZODone (DESYREL) 50 MG tablet Take 0.5-1 tablets (25-50 mg total) by mouth at bedtime.   No facility-administered encounter medications on file as of 11/20/2017.     Past Medical History:  Diagnosis Date  . Allergy   . Anemia   . Avascular necrosis of bone (HCC)    left tibial talus due to chronic prednisone use  . 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  GERD (gastroesophageal reflux disease)   . Headache(784.0)   . Hyperlipemia   . Hypertension   . Lupus   . Secondary hyperparathyroidism (of renal origin)     Past Surgical History:  Procedure Laterality Date  . AV FISTULA PLACEMENT Left   . AV FISTULA PLACEMENT Right 01/26/2014   Procedure: ARTERIOVENOUS (AV) FISTULA CREATION; ultrasound guided;  Surgeon: Conrad [place], MD;  Location: [facility];  Service: Vascular;  Laterality: Right;  . ESOPHAGOGASTRODUODENOSCOPY N/A 03/17/2016   Procedure: ESOPHAGOGASTRODUODENOSCOPY (EGD);  Surgeon: Irene Shipper, MD;  Location: [facility] ENDOSCOPY;  Service: Endoscopy;  Laterality:  N/A;  . HEMATOMA EVACUATION Left 10/09/2013   Procedure: EVACUATION HEMATOMA;  Surgeon: Angelia Mould, MD;  Location: [facility];  Service: Vascular;  Laterality: Left;  . INCISION AND DRAINAGE ABSCESS     gluteal  . INSERTION OF DIALYSIS CATHETER N/A 10/09/2013   Procedure: INSERTION OF DIALYSIS CATHETER; ULTRASOUND GUIDED;  Surgeon: Angelia Mould, MD;  Location: [place];  Service: Vascular;  Laterality: N/A;  . SHUNTOGRAM N/A 01/07/2014   Procedure: fistulogram with possibe venoplasty left upper arm avg;  Surgeon: Angelia Mould, MD;  Location: [facility] CATH LAB;  Service: Cardiovascular;  Laterality: N/A;  . tibiocalcaneal fusion  left      Social History   Socioeconomic History  . Marital status: Single    Spouse name: none  . Number of children: 0  . Years of education: 27  . Highest education level: Some college, no degree  Social Needs  . Financial resource strain: Not very hard  . Food insecurity - worry: Never true  . Food insecurity - inability: Never true  . Transportation needs - medical: No  . Transportation needs - non-medical: No  Occupational History  . Occupation: unemployed    Comment: disability  Tobacco Use  . Smoking status: Never Smoker  . Smokeless tobacco: Never Used  Substance and Sexual Activity  . Alcohol use: No  . Drug use: No  . Sexual activity: Not Currently  Other Topics Concern  . Not on file  Social History Narrative   Lives alone   [place] to [place] 4 months ago to be closer to family   Previously lived in [place] , more active   Very close to sister [place]    Family History  Problem Relation Age of Onset  . Asthma Sister   . Hypertension Mother   . Heart attack Mother 78       died at 71  . COPD Maternal Uncle        prostate  . Mental illness Neg Hx     Review of Systems  Constitutional: Positive for malaise/fatigue. Negative for chills, fever and weight loss.       Specially with lupus flares  HENT: Negative for  congestion and hearing loss.        Overdue for dental evaluation  Eyes: Positive for blurred vision. Negative for pain.       Overdue for eye evaluation  Respiratory: Negative for cough and shortness of breath.   Cardiovascular: Positive for palpitations and leg swelling. Negative for chest pain.       Discussed in HPI  Gastrointestinal: Negative for abdominal pain, constipation, diarrhea and heartburn.  Genitourinary:       Little urine output  Musculoskeletal: Negative for falls, joint pain and myalgias.  Skin: Positive for itching.       Periodic  Neurological: Positive for headaches. Negative for dizziness and seizures.  Psychiatric/Behavioral: Negative for depression. The patient is nervous/anxious. The patient does not have insomnia.     BP (!) 182/100 (BP Location: Right Arm, Patient Position: Sitting, Cuff Size: Normal)   Pulse 100   Temp 98.2 F (36.8 C) (Temporal)   Resp 16   Ht 5\' 4"  (1.626 m)   Wt 146 lb 0.6 oz (66.2 kg)   LMP 10/03/2017 (Approximate)   SpO2 100%   BMI 25.07 kg/m   Physical Exam  Constitutional: She is oriented to person, place, and time. She appears well-developed and well-nourished. No distress.  HENT:  Head: Normocephalic and atraumatic.  Right Ear: External ear normal.  Left Ear: External ear normal.  Mouth/Throat: Oropharynx is clear and moist.  Needs dental repair  Eyes: Conjunctivae are normal. Pupils are equal, round, and reactive to light.  Neck: Normal range of motion. No JVD present. No thyromegaly present.  Cardiovascular: Normal rate and regular rhythm.  No murmur heard. Pulmonary/Chest: Effort normal and breath sounds normal. No respiratory distress.  Abdominal: Soft. Bowel sounds are normal. There is no tenderness.  Musculoskeletal: She exhibits edema.  Pitting edema evident in compression stockings  Neurological: She is alert and oriented to person, place, and time.  Psychiatric: She has a normal mood and affect. Her behavior  is normal.   ASSESSMENT/PLAN:  1. Heart palpitations New complaint.  Anxiety versus palpitations. - Holter monitor - 24 hour; Future - EKG 12-Lead - Ambulatory referral to Cardiology  2. Essential hypertension Not controlled.  Discussed with nephrology.  Double diltiazem.  3. ESRD on dialysis [facility]) Under care of [facility] in [place]  4. Other forms of systemic lupus erythematosus, unspecified organ involvement status (HCC) Stable on prednisone  5. EKG abnormalities Her new EKG shows right bundle branch block.  She has sinus arrhythmia.  This is changed from prior, and with her symptoms requires further evaluation. - Ambulatory referral to Cardiology  6. Thrombocytopenia (HCC) Chronic, also anemia from end-stage renal failure  7. Secondary renal hyperparathyroidism [facility]) Chronic   Patient Instructions  Your EKG shows some changes; new right bundle branch block with sinus arrhythmia I have ordered a Holter monitor to look for palpitations. I have ordered a cardiology consultation.  Your blood pressure is still very high.  I recommend you take 2 of your Cardizem (diltiazem) tonight.  Your blood pressure will be checked tomorrow during dialysis.  No changes in medicines.  Make sure you take an updated medicine list to your dialysis visit Horris Latino at the dialysis center says they are unaware of your paroxetine and buspirone prescriptions.  Please ask your dialysis doctors about other immunizations such as the new shingles shot, and a Prevnar booster.  See me every 3 months.  Come sooner if you have problems.   Raylene Everts, MD

## 2017-11-21 DIAGNOSIS — D509 Iron deficiency anemia, unspecified: Secondary | ICD-10-CM | POA: Diagnosis not present

## 2017-11-21 DIAGNOSIS — N186 End stage renal disease: Secondary | ICD-10-CM | POA: Diagnosis not present

## 2017-11-21 DIAGNOSIS — Z992 Dependence on renal dialysis: Secondary | ICD-10-CM | POA: Diagnosis not present

## 2017-11-21 DIAGNOSIS — N2581 Secondary hyperparathyroidism of renal origin: Secondary | ICD-10-CM | POA: Diagnosis not present

## 2017-11-21 DIAGNOSIS — D631 Anemia in chronic kidney disease: Secondary | ICD-10-CM | POA: Diagnosis not present

## 2017-11-23 DIAGNOSIS — N186 End stage renal disease: Secondary | ICD-10-CM | POA: Diagnosis not present

## 2017-11-23 DIAGNOSIS — D631 Anemia in chronic kidney disease: Secondary | ICD-10-CM | POA: Diagnosis not present

## 2017-11-23 DIAGNOSIS — Z992 Dependence on renal dialysis: Secondary | ICD-10-CM | POA: Diagnosis not present

## 2017-11-23 DIAGNOSIS — N2581 Secondary hyperparathyroidism of renal origin: Secondary | ICD-10-CM | POA: Diagnosis not present

## 2017-11-23 DIAGNOSIS — D509 Iron deficiency anemia, unspecified: Secondary | ICD-10-CM | POA: Diagnosis not present

## 2017-11-26 DIAGNOSIS — D631 Anemia in chronic kidney disease: Secondary | ICD-10-CM | POA: Diagnosis not present

## 2017-11-26 DIAGNOSIS — N2581 Secondary hyperparathyroidism of renal origin: Secondary | ICD-10-CM | POA: Diagnosis not present

## 2017-11-26 DIAGNOSIS — D509 Iron deficiency anemia, unspecified: Secondary | ICD-10-CM | POA: Diagnosis not present

## 2017-11-26 DIAGNOSIS — N186 End stage renal disease: Secondary | ICD-10-CM | POA: Diagnosis not present

## 2017-11-26 DIAGNOSIS — Z992 Dependence on renal dialysis: Secondary | ICD-10-CM | POA: Diagnosis not present

## 2017-11-27 ENCOUNTER — Encounter: Payer: Self-pay | Admitting: Vascular Surgery

## 2017-11-27 ENCOUNTER — Other Ambulatory Visit: Payer: Self-pay

## 2017-11-27 ENCOUNTER — Ambulatory Visit (HOSPITAL_COMMUNITY)
Admission: RE | Admit: 2017-11-27 | Discharge: 2017-11-27 | Disposition: A | Discharge status: Home / Self Care | Payer: Medicare Other | Source: Ambulatory Visit | Attending: Vascular Surgery | Admitting: Vascular Surgery

## 2017-11-27 ENCOUNTER — Other Ambulatory Visit: Payer: Self-pay | Admitting: *Deleted

## 2017-11-27 ENCOUNTER — Ambulatory Visit (INDEPENDENT_AMBULATORY_CARE_PROVIDER_SITE_OTHER): Payer: Medicare Other | Admitting: Vascular Surgery

## 2017-11-27 VITALS — BP 158/99 | HR 78 | Temp 98.0°F | Resp 18 | Ht 64.0 in | Wt 146.3 lb

## 2017-11-27 DIAGNOSIS — Z992 Dependence on renal dialysis: Secondary | ICD-10-CM

## 2017-11-27 DIAGNOSIS — N186 End stage renal disease: Secondary | ICD-10-CM | POA: Diagnosis not present

## 2017-11-27 DIAGNOSIS — T82510A Breakdown (mechanical) of surgically created arteriovenous fistula, initial encounter: Secondary | ICD-10-CM | POA: Diagnosis not present

## 2017-11-27 DIAGNOSIS — Y832 Surgical operation with anastomosis, bypass or graft as the cause of abnormal reaction of the patient, or of later complication, without mention of misadventure at the time of the procedure: Secondary | ICD-10-CM | POA: Insufficient documentation

## 2017-11-27 MED ORDER — PANTOPRAZOLE SODIUM 40 MG PO TBEC: 40.0000 mg | DELAYED_RELEASE_TABLET | Freq: Every day | ORAL | 6 refills | Status: DC

## 2017-11-27 NOTE — Progress Notes (Signed)
Patient name: [name] MRN: [id number] DOB: December 24, 1975 Sex: female  REASON FOR VISIT:   Abnormal flow in the left AV graft.  The consult is requested by Dr. Lowanda Foster.  HPI:   [name] is a pleasant 42 y.o. female who had a left upper arm loop graft placed 6 years ago.  This is been worked on multiple times.  Most recently it was worked on about 6 months ago by [facility].  She has had poor flows in her fistula and therefore was sent for evaluation.  She denies any uremic symptoms.  Specifically, she denies nausea, vomiting, fatigue, anorexia, or palpitations.  She has no pain or paresthesias in her left arm.  She has had previous fistula attempts on the right but no AV graft in the right arm.  Past Medical History:  Diagnosis Date  . Allergy   . Anemia   . Avascular necrosis of bone (HCC)    left tibial talus due to chronic prednisone use  . 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 GERD (gastroesophageal reflux disease)   . Headache(784.0)   . Hyperlipemia   . Hypertension   . Lupus   . Secondary hyperparathyroidism (of renal origin)     Family History  Problem Relation Age of Onset  . Asthma Sister   . Hypertension Mother   . Heart attack Mother 38       died at 72  . COPD Maternal Uncle        prostate  . Mental illness Neg Hx     SOCIAL HISTORY: Social History   Tobacco Use  . Smoking status: Never Smoker  . Smokeless tobacco: Never Used  Substance Use Topics  . Alcohol use: No    Allergies  Allergen Reactions  . Sulfasalazine Hives and Itching  . Amlodipine Hives    Swelling  . Sulfa Antibiotics Hives and Itching  . Vancomycin Hives and Itching    Current Outpatient Medications  Medication Sig Dispense Refill  . acetaminophen (TYLENOL) 500 MG tablet Take 500 mg by mouth as needed for headache.     . busPIRone (BUSPAR) 5 MG tablet Take 5  mg by mouth.    . calcium acetate (PHOSLO) 667 MG capsule Take 1,334 mg by mouth 3 (three) times daily with meals.     Marland Kitchen diltiazem (CARDIZEM CD) 120 MG 24 hr capsule     . lisinopril (PRINIVIL,ZESTRIL) 40 MG tablet     . multivitamin (RENA-VIT) TABS tablet Take 1 tablet by mouth at bedtime. 30 each 0  . pantoprazole (PROTONIX) 40 MG tablet Take 1 tablet (40 mg total) by mouth daily. 30 tablet 6  . PARoxetine (PAXIL) 20 MG tablet Take 20 mg by mouth daily.    . predniSONE (DELTASONE) 10 MG tablet Take 1 tablet (10 mg total) by mouth daily with breakfast. 90 tablet 3  . sevelamer carbonate (RENVELA) 800 MG tablet Take 800 mg by mouth 3 (three) times daily with meals.    . traZODone (DESYREL) 50 MG tablet Take 0.5-1 tablets (25-50 mg total) by mouth at bedtime. 90 tablet 3   No current facility-administered medications for this visit.     REVIEW OF SYSTEMS:  [X]  denotes positive finding, [ ]  denotes negative finding Cardiac  Comments:  Chest pain or chest pressure:    Shortness of breath upon exertion:    Short of  breath when lying flat:    Irregular heart rhythm:        Vascular    Pain in calf, thigh, or hip brought on by ambulation:    Pain in feet at night that wakes you up from your sleep:     Blood clot in your veins:    Leg swelling:  X       Pulmonary    Oxygen at home:    Productive cough:     Wheezing:         Neurologic    Sudden weakness in arms or legs:     Sudden numbness in arms or legs:     Sudden onset of difficulty speaking or slurred speech:    Temporary loss of vision in one eye:     Problems with dizziness:         Gastrointestinal    Blood in stool:     Vomited blood:         Genitourinary    Burning when urinating:     Blood in urine:        Psychiatric    Major depression:         Hematologic    Bleeding problems:    Problems with blood clotting too easily:        Skin    Rashes or ulcers:        Constitutional    Fever or chills:      PHYSICAL EXAM:   Vitals:   11/27/17 0851 11/27/17 0852  BP: (!) 148/98 (!) 158/99  Pulse: 78   Resp: 18   Temp: 98 F (36.7 C)   TempSrc: Oral   SpO2: 100%   Weight: 146 lb 4.8 oz (66.4 kg)   Height: 5\' 4"  (1.626 m)     GENERAL: The patient is a well-nourished female, in no acute distress. The vital signs are documented above. CARDIAC: There is a regular rate and rhythm.  VASCULAR: Her graft in the upper arm has a palpable thrill and audible bruit. She has a little hematoma overlying the graft versus a pseudoaneurysm along the medial aspect of the graft. I cannot palpate a left radial pulse. PULMONARY: There is good air exchange bilaterally without wheezing or rales. ABDOMEN: Soft and non-tender with normal pitched bowel sounds.  SKIN: There are no ulcers or rashes noted. PSYCHIATRIC: The patient has a normal affect.  DATA:    DUPLEX: I have independently interpreted the duplex of her left AV graft which shows elevated velocities in several segments within the graft.  There is also aneurysmal dilatation of the mid graft to 1.62 cm.  MEDICAL ISSUES:   POORLY FUNCTIONING LEFT UPPER ARM LOOP AV GRAFT: This graft is been in for 6 years and I suspect that she has multiple areas of degeneration.  I have recommended that we proceed with a fistulogram to determine if there would be a way to revise half of the graft and leave the other half for continued cannulation.  Perhaps the whole graft ultimately be replaced in a staged fashion.  She dialyzes on Tuesdays Thursdays and Saturdays and I have scheduled her fistulogram for Monday, 12/02/2017.  I have discussed the procedure and potential complications and she is agreeable to proceed.  I will make further recommendations pending these results.  [name] [facility] [phone]

## 2017-11-28 DIAGNOSIS — Z992 Dependence on renal dialysis: Secondary | ICD-10-CM | POA: Diagnosis not present

## 2017-11-28 DIAGNOSIS — N186 End stage renal disease: Secondary | ICD-10-CM | POA: Diagnosis not present

## 2017-11-28 DIAGNOSIS — N2581 Secondary hyperparathyroidism of renal origin: Secondary | ICD-10-CM | POA: Diagnosis not present

## 2017-11-28 DIAGNOSIS — D509 Iron deficiency anemia, unspecified: Secondary | ICD-10-CM | POA: Diagnosis not present

## 2017-11-28 DIAGNOSIS — D631 Anemia in chronic kidney disease: Secondary | ICD-10-CM | POA: Diagnosis not present

## 2017-11-30 DIAGNOSIS — D509 Iron deficiency anemia, unspecified: Secondary | ICD-10-CM | POA: Diagnosis not present

## 2017-11-30 DIAGNOSIS — N2581 Secondary hyperparathyroidism of renal origin: Secondary | ICD-10-CM | POA: Diagnosis not present

## 2017-11-30 DIAGNOSIS — N186 End stage renal disease: Secondary | ICD-10-CM | POA: Diagnosis not present

## 2017-11-30 DIAGNOSIS — D631 Anemia in chronic kidney disease: Secondary | ICD-10-CM | POA: Diagnosis not present

## 2017-11-30 DIAGNOSIS — Z992 Dependence on renal dialysis: Secondary | ICD-10-CM | POA: Diagnosis not present

## 2017-12-02 ENCOUNTER — Encounter (HOSPITAL_COMMUNITY)
Admission: RE | Disposition: A | Discharge status: Home / Self Care | Payer: Self-pay | Source: Ambulatory Visit | Attending: Vascular Surgery

## 2017-12-02 ENCOUNTER — Ambulatory Visit (HOSPITAL_COMMUNITY)
Admission: RE | Admit: 2017-12-02 | Discharge: 2017-12-02 | Disposition: A | Discharge status: Home / Self Care | Payer: Medicare Other | Source: Ambulatory Visit | Attending: Vascular Surgery | Admitting: Vascular Surgery

## 2017-12-02 ENCOUNTER — Encounter (HOSPITAL_COMMUNITY): Payer: Self-pay | Admitting: Vascular Surgery

## 2017-12-02 DIAGNOSIS — D631 Anemia in chronic kidney disease: Secondary | ICD-10-CM | POA: Insufficient documentation

## 2017-12-02 DIAGNOSIS — I120 Hypertensive chronic kidney disease with stage 5 chronic kidney disease or end stage renal disease: Secondary | ICD-10-CM | POA: Diagnosis not present

## 2017-12-02 DIAGNOSIS — Z992 Dependence on renal dialysis: Secondary | ICD-10-CM | POA: Insufficient documentation

## 2017-12-02 DIAGNOSIS — T82858A Stenosis of vascular prosthetic devices, implants and grafts, initial encounter: Secondary | ICD-10-CM | POA: Diagnosis not present

## 2017-12-02 DIAGNOSIS — T82898A Other specified complication of vascular prosthetic devices, implants and grafts, initial encounter: Secondary | ICD-10-CM | POA: Insufficient documentation

## 2017-12-02 DIAGNOSIS — Z7952 Long term (current) use of systemic steroids: Secondary | ICD-10-CM | POA: Diagnosis not present

## 2017-12-02 DIAGNOSIS — Y832 Surgical operation with anastomosis, bypass or graft as the cause of abnormal reaction of the patient, or of later complication, without mention of misadventure at the time of the procedure: Secondary | ICD-10-CM | POA: Diagnosis not present

## 2017-12-02 DIAGNOSIS — N186 End stage renal disease: Secondary | ICD-10-CM

## 2017-12-02 DIAGNOSIS — Z79899 Other long term (current) drug therapy: Secondary | ICD-10-CM | POA: Insufficient documentation

## 2017-12-02 DIAGNOSIS — Z8249 Family history of ischemic heart disease and other diseases of the circulatory system: Secondary | ICD-10-CM | POA: Insufficient documentation

## 2017-12-02 HISTORY — PX: A/V FISTULAGRAM: CATH118298

## 2017-12-02 HISTORY — PX: PERIPHERAL VASCULAR BALLOON ANGIOPLASTY: CATH118281

## 2017-12-02 LAB — POCT I-STAT, CHEM 8
BUN: 62 mg/dL — ABNORMAL HIGH (ref 6–20)
Calcium, Ion: 1.04 mmol/L — ABNORMAL LOW (ref 1.15–1.40)
Chloride: 99 mmol/L — ABNORMAL LOW (ref 101–111)
Creatinine, Ser: 8.4 mg/dL — ABNORMAL HIGH (ref 0.44–1.00)
Glucose, Bld: 82 mg/dL (ref 65–99)
HEMATOCRIT: 36 % (ref 36.0–46.0)
HEMOGLOBIN: 12.2 g/dL (ref 12.0–15.0)
POTASSIUM: 4 mmol/L (ref 3.5–5.1)
Sodium: 138 mmol/L (ref 135–145)
TCO2: 27 mmol/L (ref 22–32)

## 2017-12-02 LAB — HCG, SERUM, QUALITATIVE: Preg, Serum: NEGATIVE

## 2017-12-02 SURGERY — A/V FISTULAGRAM
Anesthesia: LOCAL | Laterality: Left

## 2017-12-02 MED ORDER — HYDRALAZINE HCL 20 MG/ML IJ SOLN
INTRAMUSCULAR | Status: DC | PRN
Start: 2017-12-02 — End: 2017-12-02
  Administered 2017-12-02: 10 mg via INTRAVENOUS

## 2017-12-02 MED ORDER — LIDOCAINE HCL (PF) 1 % IJ SOLN
INTRAMUSCULAR | Status: AC
  Filled 2017-12-02: qty 30

## 2017-12-02 MED ORDER — HEPARIN (PORCINE) IN NACL 2-0.9 UNIT/ML-% IJ SOLN
INTRAMUSCULAR | Status: AC | PRN
  Administered 2017-12-02: 500 mL

## 2017-12-02 MED ORDER — HEPARIN (PORCINE) IN NACL 2-0.9 UNIT/ML-% IJ SOLN
INTRAMUSCULAR | Status: AC
  Filled 2017-12-02: qty 1000

## 2017-12-02 MED ORDER — SODIUM CHLORIDE 0.9% FLUSH: 3.0000 mL | INTRAVENOUS | Status: DC | PRN

## 2017-12-02 MED ORDER — HEPARIN SODIUM (PORCINE) 1000 UNIT/ML IJ SOLN
INTRAMUSCULAR | Status: AC
  Filled 2017-12-02: qty 1

## 2017-12-02 MED ORDER — LIDOCAINE HCL (PF) 1 % IJ SOLN
INTRAMUSCULAR | Status: DC | PRN
  Administered 2017-12-02: 2 mL

## 2017-12-02 MED ORDER — SODIUM CHLORIDE 0.9 % IV SOLN: 250.0000 mL | INTRAVENOUS | Status: DC | PRN

## 2017-12-02 MED ORDER — SODIUM CHLORIDE 0.9% FLUSH: 3.0000 mL | Freq: Two times a day (BID) | INTRAVENOUS | Status: DC

## 2017-12-02 MED ORDER — HEPARIN SODIUM (PORCINE) 1000 UNIT/ML IJ SOLN
INTRAMUSCULAR | Status: DC | PRN
  Administered 2017-12-02: 3000 [IU] via INTRAVENOUS

## 2017-12-02 MED ORDER — HYDRALAZINE HCL 20 MG/ML IJ SOLN
INTRAMUSCULAR | Status: AC
  Filled 2017-12-02: qty 1

## 2017-12-02 MED ORDER — IODIXANOL 320 MG/ML IV SOLN
INTRAVENOUS | Status: DC | PRN
  Administered 2017-12-02: 40 mL via INTRAVENOUS

## 2017-12-02 SURGICAL SUPPLY — 17 items
BAG SNAP BAND KOVER 36X36 (MISCELLANEOUS) ×2 IMPLANT
BALLN LUTONIX AV 8X60X75 (BALLOONS) ×2
BALLN MUSTANG 8X60X75 (BALLOONS) ×2
BALLOON LUTONIX AV 8X60X75 (BALLOONS) IMPLANT
BALLOON MUSTANG 8X60X75 (BALLOONS) IMPLANT
COVER DOME SNAP 22 D (MISCELLANEOUS) ×2 IMPLANT
COVER PRB 48X5XTLSCP FOLD TPE (BAG) ×1 IMPLANT
COVER PROBE 5X48 (BAG) ×2
KIT ENCORE 26 ADVANTAGE (KITS) ×1 IMPLANT
KIT MICROINTRODUCER STIFF 5F (SHEATH) ×1 IMPLANT
PROTECTION STATION PRESSURIZED (MISCELLANEOUS) ×2
SHEATH PINNACLE R/O II 7F 4CM (SHEATH) ×1 IMPLANT
STATION PROTECTION PRESSURIZED (MISCELLANEOUS) IMPLANT
STOPCOCK MORSE 400PSI 3WAY (MISCELLANEOUS) ×2 IMPLANT
TRAY PV CATH (CUSTOM PROCEDURE TRAY) ×2 IMPLANT
TUBING CIL FLEX 10 FLL-RA (TUBING) ×1 IMPLANT
WIRE BENTSON .035X145CM (WIRE) ×1 IMPLANT

## 2017-12-02 NOTE — Interval H&P Note (Signed)
History and Physical Interval Note:  12/02/2017 10:21 AM  [name]  has presented today for surgery, with the diagnosis of low flow - renal pt  The various methods of treatment have been discussed with the patient and family. After consideration of risks, benefits and other options for treatment, the patient has consented to  Procedure(s): A/V FISTULAGRAM - Left upper (N/A) as a surgical intervention .  The patient's history has been reviewed, patient examined, no change in status, stable for surgery.  I have reviewed the patient's chart and labs.  Questions were answered to the patient's satisfaction.     [name]

## 2017-12-02 NOTE — Discharge Instructions (Signed)

## 2017-12-02 NOTE — Op Note (Signed)
    NAME: [name]    MRN: [id number] DOB: 09/02/1976    DATE OF OPERATION: 12/02/2017  PREOP DIAGNOSIS:    Poorly functioning left upper arm loop AV graft  POSTOP DIAGNOSIS:    Same  PROCEDURE:    1.  Ultrasound-guided access to left AV graft 2.  Fistulogram left AV graft 3.  Drug-coated balloon angioplasty of venous anastomotic stenosis  SURGEON: Christopher S. Dickson, MD, FACS  ANESTHESIA: Local  EBL: Minimal  INDICATIONS:    [name] is a 41 y.o. female who was having poor flow in her left upper arm graft.  This graft had been in for many years and was likely degenerative.  I recommended a fistulogram to further assess the graft.  FINDINGS:   There was a 70% venous outflow stenosis which was successfully addressed with drug-coated balloon angioplasty.  There is a pseudoaneurysm along the arterial/medial aspect of the graft.  The remainder of the graft has mild diffuse disease and calcific disease but no focal stenosis.  There were no problems identified at the arterial anastomosis.  TECHNIQUE:   The patient was taken to the peripheral vascular lab.  The left arm was prepped and draped in usual sterile fashion.  I cannulated the distal loop along the lateral aspect of the graft after the skin was anesthetized with 1% lidocaine.  This was done under ultrasound guidance.  A micropuncture sheath was introduced over the wire.  A fistula gram was obtained which showed a venous outflow stenosis.  Of note the venous limb of the graft was along the lateral aspect of the upper arm.  Patient was heparinized.  I then exchanged the micropuncture sheath for a short 7 French sheath.  I initially selected an 8 mm x 6 cm balloon which was positioned across the in-stent stenosis and venous anastomosis and inflated to 10 atm for 1 minute.  The follow-up film showed some residual stenosis and I went back with a drug-coated balloon.  This was an 8 mm x 6 cm balloon.  This was  inflated to 8 atm for 2 minutes.  Completion film showed an excellent result.  Of note while the balloon was inflated with did do a reflux shot to evaluate the arterial limb of the graft which was along the medial aspect of the upper arm.  There was no problem with the arterial anastomosis.  There was a large pseudoaneurysm along the midportion of the graft on the arterial side.  There was also calcific disease and mild degenerative disease throughout the graft.  Christopher Dickson, MD, FACS [facility] [place]  DATE OF DICTATION:   12/02/2017   

## 2017-12-03 ENCOUNTER — Telehealth: Payer: Self-pay | Admitting: Family Medicine

## 2017-12-03 ENCOUNTER — Telehealth: Payer: Self-pay | Admitting: Vascular Surgery

## 2017-12-03 DIAGNOSIS — D631 Anemia in chronic kidney disease: Secondary | ICD-10-CM | POA: Diagnosis not present

## 2017-12-03 DIAGNOSIS — Z992 Dependence on renal dialysis: Secondary | ICD-10-CM | POA: Diagnosis not present

## 2017-12-03 DIAGNOSIS — N186 End stage renal disease: Secondary | ICD-10-CM | POA: Diagnosis not present

## 2017-12-03 DIAGNOSIS — N2581 Secondary hyperparathyroidism of renal origin: Secondary | ICD-10-CM | POA: Diagnosis not present

## 2017-12-03 DIAGNOSIS — D509 Iron deficiency anemia, unspecified: Secondary | ICD-10-CM | POA: Diagnosis not present

## 2017-12-03 MED FILL — Heparin Sodium (Porcine) 2 Unit/ML in Sodium Chloride 0.9%: INTRAMUSCULAR | Qty: 500 | Status: AC

## 2017-12-03 NOTE — Telephone Encounter (Signed)
-----   Message from Mena Goes, RN sent at 12/02/2017  4:50 PM EST ----- Regarding: 3-4 weeks   ----- Message ----- From: Angelia Mould, MD Sent: 12/02/2017  11:12 AM To: [facility] Charge Pool Subject: charge and f/u                                  PROCEDURE:   1.  Ultrasound-guided access to left AV graft 2.  Fistulogram left AV graft 3.  Drug-coated balloon angioplasty of venous anastomotic stenosis  SURGEON: Judeth Cornfield. Scot Dock, MD, FACS  ANESTHESIA: Local  She will need a follow-up visit in 3-4 weeks.  Thank you.

## 2017-12-03 NOTE — Telephone Encounter (Signed)
Done

## 2017-12-03 NOTE — Telephone Encounter (Signed)
[name] Admin Assist from [employer], requesting the last physical on the patient, please send to Fax [phone], Ph [phone]

## 2017-12-03 NOTE — Telephone Encounter (Signed)
Sched appt 01/01/18 at 4:00. Lm on hm#.

## 2017-12-04 ENCOUNTER — Other Ambulatory Visit: Payer: Self-pay

## 2017-12-04 MED ORDER — PANTOPRAZOLE SODIUM 40 MG PO TBEC: 40.0000 mg | DELAYED_RELEASE_TABLET | Freq: Every day | ORAL | 3 refills | Status: DC

## 2017-12-05 ENCOUNTER — Telehealth: Payer: Self-pay | Admitting: Family Medicine

## 2017-12-05 DIAGNOSIS — D509 Iron deficiency anemia, unspecified: Secondary | ICD-10-CM | POA: Diagnosis not present

## 2017-12-05 DIAGNOSIS — D631 Anemia in chronic kidney disease: Secondary | ICD-10-CM | POA: Diagnosis not present

## 2017-12-05 DIAGNOSIS — N186 End stage renal disease: Secondary | ICD-10-CM | POA: Diagnosis not present

## 2017-12-05 DIAGNOSIS — N2581 Secondary hyperparathyroidism of renal origin: Secondary | ICD-10-CM | POA: Diagnosis not present

## 2017-12-05 DIAGNOSIS — Z992 Dependence on renal dialysis: Secondary | ICD-10-CM | POA: Diagnosis not present

## 2017-12-05 NOTE — Telephone Encounter (Signed)
Please clarify what she should be taking for cardizem. Your last note stated to take 2 that night, she states she has been taking 2 every night since she saw you. She says her diastolic has been around 929 when she is at dialysis.

## 2017-12-05 NOTE — Telephone Encounter (Signed)
[name] for 1 visit.  Her blood pressure was quite elevated.  I called her nephrologist who recommended that she double the diltiazem until they could evaluate her.  She sees them quite frequently and the nephrologist will make any other recommendations regarding her medication.  Yes, she is taking 2 a day.

## 2017-12-05 NOTE — Telephone Encounter (Signed)
Please call pt about her BP meds, [phone]

## 2017-12-06 ENCOUNTER — Telehealth: Payer: Self-pay | Admitting: Family Medicine

## 2017-12-06 NOTE — Telephone Encounter (Signed)
diltiazem (CARDIZEM CD) 120 MG 24 hr capsule, pt thought you increased the mg, the pharmacy doesn't have it please advise, call the pt [phone]

## 2017-12-06 NOTE — Telephone Encounter (Signed)
Left message for Kafi to call office

## 2017-12-07 DIAGNOSIS — N2581 Secondary hyperparathyroidism of renal origin: Secondary | ICD-10-CM | POA: Diagnosis not present

## 2017-12-07 DIAGNOSIS — D631 Anemia in chronic kidney disease: Secondary | ICD-10-CM | POA: Diagnosis not present

## 2017-12-07 DIAGNOSIS — D509 Iron deficiency anemia, unspecified: Secondary | ICD-10-CM | POA: Diagnosis not present

## 2017-12-07 DIAGNOSIS — N186 End stage renal disease: Secondary | ICD-10-CM | POA: Diagnosis not present

## 2017-12-07 DIAGNOSIS — Z992 Dependence on renal dialysis: Secondary | ICD-10-CM | POA: Diagnosis not present

## 2017-12-10 DIAGNOSIS — D631 Anemia in chronic kidney disease: Secondary | ICD-10-CM | POA: Diagnosis not present

## 2017-12-10 DIAGNOSIS — Z992 Dependence on renal dialysis: Secondary | ICD-10-CM | POA: Diagnosis not present

## 2017-12-10 DIAGNOSIS — N2581 Secondary hyperparathyroidism of renal origin: Secondary | ICD-10-CM | POA: Diagnosis not present

## 2017-12-10 DIAGNOSIS — D509 Iron deficiency anemia, unspecified: Secondary | ICD-10-CM | POA: Diagnosis not present

## 2017-12-10 DIAGNOSIS — N186 End stage renal disease: Secondary | ICD-10-CM | POA: Diagnosis not present

## 2017-12-12 DIAGNOSIS — Z992 Dependence on renal dialysis: Secondary | ICD-10-CM | POA: Diagnosis not present

## 2017-12-12 DIAGNOSIS — N186 End stage renal disease: Secondary | ICD-10-CM | POA: Diagnosis not present

## 2017-12-12 DIAGNOSIS — N2581 Secondary hyperparathyroidism of renal origin: Secondary | ICD-10-CM | POA: Diagnosis not present

## 2017-12-12 DIAGNOSIS — D631 Anemia in chronic kidney disease: Secondary | ICD-10-CM | POA: Diagnosis not present

## 2017-12-12 DIAGNOSIS — D509 Iron deficiency anemia, unspecified: Secondary | ICD-10-CM | POA: Diagnosis not present

## 2017-12-14 DIAGNOSIS — N2581 Secondary hyperparathyroidism of renal origin: Secondary | ICD-10-CM | POA: Diagnosis not present

## 2017-12-14 DIAGNOSIS — N186 End stage renal disease: Secondary | ICD-10-CM | POA: Diagnosis not present

## 2017-12-14 DIAGNOSIS — D509 Iron deficiency anemia, unspecified: Secondary | ICD-10-CM | POA: Diagnosis not present

## 2017-12-14 DIAGNOSIS — D631 Anemia in chronic kidney disease: Secondary | ICD-10-CM | POA: Diagnosis not present

## 2017-12-14 DIAGNOSIS — Z992 Dependence on renal dialysis: Secondary | ICD-10-CM | POA: Diagnosis not present

## 2017-12-16 ENCOUNTER — Ambulatory Visit (INDEPENDENT_AMBULATORY_CARE_PROVIDER_SITE_OTHER): Payer: Medicare Other | Admitting: Cardiovascular Disease

## 2017-12-16 ENCOUNTER — Encounter: Payer: Self-pay | Admitting: Cardiovascular Disease

## 2017-12-16 ENCOUNTER — Other Ambulatory Visit: Payer: Self-pay

## 2017-12-16 VITALS — BP 150/91 | HR 93 | Ht 64.0 in | Wt 149.0 lb

## 2017-12-16 DIAGNOSIS — R002 Palpitations: Secondary | ICD-10-CM | POA: Diagnosis not present

## 2017-12-16 DIAGNOSIS — I10 Essential (primary) hypertension: Secondary | ICD-10-CM

## 2017-12-16 DIAGNOSIS — R011 Cardiac murmur, unspecified: Secondary | ICD-10-CM | POA: Diagnosis not present

## 2017-12-16 NOTE — Progress Notes (Signed)
CARDIOLOGY CONSULT NOTE  Patient ID: [name] MRN: [id number] DOB/AGE: 03-31-1976 42 y.o.  Admit date: (Not on file) Primary Physician: Raylene Everts, MD Referring Physician: Dr. Meda Coffee  Reason for Consultation: Palpitations  HPI: [name] is a 42 y.o. female who is being seen today for the evaluation of palpitations at the request of Raylene Everts, MD.   Past medical history includes systemic lupus with proptosis and end-stage renal disease for which she is on hemodialysis.  I reviewed a recent basic metabolic panel performed on 12/03/17: Sodium 138, potassium 4, BUN 62, creatinine 8.4.  It appears a Holter monitor was ordered by her PCP.  I reviewed an ECG performed on 11/20/17 which showed sinus rhythm with sinus arrhythmia and right bundle branch block.  She recently moved here from [place].  She began experiencing palpitations in the past 2 months since her move.  She said going to a new dialysis center has made her anxious.  She denies a history of panic attacks.  She says palpitations occur about once every 2 weeks.  They usually occur while she is driving.  She said her heart beats very fast and she becomes sweaty and has to pull over and rest for about 30 minutes.  She also eats ice for symptom relief.  She said she needs to have a cup of ice in her car at all times.  Palpitations do not solely occur on dialysis days.  She denies associated chest pain and shortness of breath.  She does have bilateral leg swelling.  There have been recent adjustments to her medications to control blood pressure including an increase of long-acting diltiazem to 240 mg daily.    Allergies  Allergen Reactions  . Amlodipine Hives    Swelling  . Sulfa Antibiotics Hives and Itching  . Vancomycin Hives and Itching    Current Outpatient Medications  Medication Sig Dispense Refill  . acetaminophen (TYLENOL) 500 MG tablet Take 500 mg by mouth daily as  needed for headache.     . calcium acetate (PHOSLO) 667 MG capsule Take 1,334-2,001 mg by mouth See admin instructions. Take 2001 mg with each meal and 1334 mg with each snack    . diltiazem (CARDIZEM CD) 120 MG 24 hr capsule Take 240 mg by mouth at bedtime.    Marland Kitchen lisinopril (PRINIVIL,ZESTRIL) 40 MG tablet Take 40 mg by mouth at bedtime.     . Multiple Vitamin (MULTIVITAMIN WITH MINERALS) TABS tablet Take 1 tablet by mouth daily.    . pantoprazole (PROTONIX) 40 MG tablet Take 1 tablet (40 mg total) by mouth daily. 30 tablet 3  . PARoxetine (PAXIL) 20 MG tablet Take 20 mg by mouth daily.    . predniSONE (DELTASONE) 10 MG tablet Take 1 tablet (10 mg total) by mouth daily with breakfast. 90 tablet 3  . traZODone (DESYREL) 50 MG tablet Take 0.5-1 tablets (25-50 mg total) by mouth at bedtime. (Patient taking differently: Take 25 mg by mouth at bedtime. ) 90 tablet 3   No current facility-administered medications for this visit.     Past Medical History:  Diagnosis Date  . Allergy   . Anemia   . Avascular necrosis of bone (HCC)    left tibial talus due to chronic prednisone use  . 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 GERD (gastroesophageal reflux disease)   .  Headache(784.0)   . Hyperlipemia   . Hypertension   . Lupus   . Secondary hyperparathyroidism (of renal origin)     Past Surgical History:  Procedure Laterality Date  . A/V FISTULAGRAM Left 12/02/2017   Procedure: A/V FISTULAGRAM - Left upper;  Surgeon: Angelia Mould, MD;  Location: [facility];  Service: Cardiovascular;  Laterality: Left;  . AV FISTULA PLACEMENT Left   . AV FISTULA PLACEMENT Right 01/26/2014   Procedure: ARTERIOVENOUS (AV) FISTULA CREATION; ultrasound guided;  Surgeon: Conrad Savanna, MD;  Location: [facility];  Service: Vascular;  Laterality: Right;  . ESOPHAGOGASTRODUODENOSCOPY N/A 03/17/2016   Procedure:  ESOPHAGOGASTRODUODENOSCOPY (EGD);  Surgeon: Irene Shipper, MD;  Location: [facility] ENDOSCOPY;  Service: Endoscopy;  Laterality: N/A;  . HEMATOMA EVACUATION Left 10/09/2013   Procedure: EVACUATION HEMATOMA;  Surgeon: Angelia Mould, MD;  Location: [facility];  Service: Vascular;  Laterality: Left;  . INCISION AND DRAINAGE ABSCESS     gluteal  . INSERTION OF DIALYSIS CATHETER N/A 10/09/2013   Procedure: INSERTION OF DIALYSIS CATHETER; ULTRASOUND GUIDED;  Surgeon: Angelia Mould, MD;  Location: [facility];  Service: Vascular;  Laterality: N/A;  . PERIPHERAL VASCULAR BALLOON ANGIOPLASTY Left 12/02/2017   Procedure: PERIPHERAL VASCULAR BALLOON ANGIOPLASTY;  Surgeon: Angelia Mould, MD;  Location: [facility];  Service: Cardiovascular;  Laterality: Left;  . SHUNTOGRAM N/A 01/07/2014   Procedure: fistulogram with possibe venoplasty left upper arm avg;  Surgeon: Angelia Mould, MD;  Location: [facility] CATH LAB;  Service: Cardiovascular;  Laterality: N/A;  . tibiocalcaneal fusion  left      Social History   Socioeconomic History  . Marital status: Single    Spouse name: none  . Number of children: 0  . Years of education: 72  . Highest education level: Some college, no degree  Social Needs  . Financial resource strain: Not very hard  . Food insecurity - worry: Never true  . Food insecurity - inability: Never true  . Transportation needs - medical: No  . Transportation needs - non-medical: No  Occupational History  . Occupation: unemployed    Comment: disability  Tobacco Use  . Smoking status: Never Smoker  . Smokeless tobacco: Never Used  Substance and Sexual Activity  . Alcohol use: No  . Drug use: No  . Sexual activity: Not Currently  Other Topics Concern  . Not on file  Social History Narrative   Lives alone   [place] to [place] 4 months ago to be closer to family   Previously lived in [place] , more active   Very close to sister tonya     No family history of  premature CAD in 1st degree relatives.  Current Meds  Medication Sig  . acetaminophen (TYLENOL) 500 MG tablet Take 500 mg by mouth daily as needed for headache.   . calcium acetate (PHOSLO) 667 MG capsule Take 1,334-2,001 mg by mouth See admin instructions. Take 2001 mg with each meal and 1334 mg with each snack  . diltiazem (CARDIZEM CD) 120 MG 24 hr capsule Take 240 mg by mouth at bedtime.  Marland Kitchen lisinopril (PRINIVIL,ZESTRIL) 40 MG tablet Take 40 mg by mouth at bedtime.   . Multiple Vitamin (MULTIVITAMIN WITH MINERALS) TABS tablet Take 1 tablet by mouth daily.  . pantoprazole (PROTONIX) 40 MG tablet Take 1 tablet (40 mg total) by mouth daily.  Marland Kitchen PARoxetine (PAXIL) 20 MG tablet Take 20 mg by mouth daily.  . predniSONE (DELTASONE) 10 MG  tablet Take 1 tablet (10 mg total) by mouth daily with breakfast.  . traZODone (DESYREL) 50 MG tablet Take 0.5-1 tablets (25-50 mg total) by mouth at bedtime. (Patient taking differently: Take 25 mg by mouth at bedtime. )      Review of systems complete and found to be negative unless listed above in HPI    Physical exam Blood pressure (!) 150/91, pulse 93, height 5\' 4"  (1.626 m), weight 149 lb (67.6 kg), SpO2 96 %. General: NAD Neck: No JVD, no thyromegaly or thyroid nodule.  Lungs: Clear to auscultation bilaterally with normal respiratory effort. CV: Nondisplaced PMI. Regular rate and rhythm, normal S1/S2, no I3/J8, 2/6 systolic murmur loudest over bilateral upper sternal borders.  Bilateral 1+ lower extremity edema.  No carotid bruit.    Abdomen: Soft, nontender, no distention.  Skin: Intact without lesions or rashes.  Neurologic: Alert and oriented x 3.  Psych: Normal affect. Extremities: No clubbing or cyanosis.  HEENT: Normal.   ECG: Most recent ECG reviewed.   Labs: Lab Results  Component Value Date/Time   K 4.0 12/02/2017 08:40 AM   BUN 62 (H) 12/02/2017 08:40 AM   CREATININE 8.40 (H) 12/02/2017 08:40 AM   ALT 11 06/13/2011 05:30 AM    TSH 1.852 06/08/2011 02:37 PM   HGB 12.2 12/02/2017 08:40 AM     Lipids: No results found for: LDLCALC, LDLDIRECT, CHOL, TRIG, HDL      ASSESSMENT AND PLAN:  1.  Palpitations: Unclear if she is having premature atrial or ventricular contractions.  She has noticed a decrease in frequency of palpitations since her dose of diltiazem was increased to 240 mg daily.  They are in frequent in occurrence.  They may be related to anxiety given her recent move.  It is possible a cardiac monitor may not capture anything given the higher dose of calcium channel blocker.  Nonetheless, I will obtain a 3-week event monitor. I will order a 2-D echocardiogram with Doppler to evaluate cardiac structure, function, and regional wall motion.  2.  Cardiac murmur: I will obtain an echocardiogram to evaluate cardiac structure and function.  3.  Hypertension: Blood pressure is mildly elevated.  She has had recent adjustments to her medications by her PCP.  She is on both lisinopril 40 mg and diltiazem 240 mg daily.     Disposition: Follow up in 2 months  Signed: Kate Sable, M.D., F.A.C.C.  12/16/2017, 9:29 AM

## 2017-12-16 NOTE — Patient Instructions (Signed)
Medication Instructions:  Your physician recommends that you continue on your current medications as directed. Please refer to the Current Medication list given to you today.  Labwork: NONE  Testing/Procedures: Your physician has requested that you have an echocardiogram. Echocardiography is a painless test that uses sound waves to create images of your heart. It provides your doctor with information about the size and shape of your heart and how well your heart's chambers and valves are working. This procedure takes approximately one hour. There are no restrictions for this procedure.  Your physician has recommended that you wear an event monitor FOR 3 WEEKS. Event monitors are medical devices that record the heart's electrical activity. Doctors most often [place] these monitors to diagnose arrhythmias. Arrhythmias are problems with the speed or rhythm of the heartbeat. The monitor is a small, portable device. You can wear one while you do your normal daily activities. This is usually used to diagnose what is causing palpitations/syncope (passing out).   Follow-Up: Your physician recommends that you schedule a follow-up appointment in: 2 [place] Bronson Ing  Any Other Special Instructions Will Be Listed Below (If Applicable).  If you need a refill on your cardiac medications before your next appointment, please call your pharmacy.

## 2017-12-17 DIAGNOSIS — Z992 Dependence on renal dialysis: Secondary | ICD-10-CM | POA: Diagnosis not present

## 2017-12-17 DIAGNOSIS — N2581 Secondary hyperparathyroidism of renal origin: Secondary | ICD-10-CM | POA: Diagnosis not present

## 2017-12-17 DIAGNOSIS — N186 End stage renal disease: Secondary | ICD-10-CM | POA: Diagnosis not present

## 2017-12-17 DIAGNOSIS — D631 Anemia in chronic kidney disease: Secondary | ICD-10-CM | POA: Diagnosis not present

## 2017-12-17 DIAGNOSIS — D509 Iron deficiency anemia, unspecified: Secondary | ICD-10-CM | POA: Diagnosis not present

## 2017-12-18 ENCOUNTER — Ambulatory Visit (INDEPENDENT_AMBULATORY_CARE_PROVIDER_SITE_OTHER): Payer: Medicare Other

## 2017-12-18 ENCOUNTER — Other Ambulatory Visit: Payer: Self-pay

## 2017-12-18 DIAGNOSIS — R002 Palpitations: Secondary | ICD-10-CM | POA: Diagnosis not present

## 2017-12-18 DIAGNOSIS — R011 Cardiac murmur, unspecified: Secondary | ICD-10-CM | POA: Diagnosis not present

## 2017-12-18 LAB — ECHOCARDIOGRAM COMPLETE
AOPV: 0.56 m/s
AV Area VTI: 1.41 cm2
AV Peak grad: 27 mmHg
AV peak Index: 0.82
AV pk vel: 261 cm/s
E decel time: 386 msec
E/e' ratio: 28.1
FS: 41 % (ref 28–44)
IVS/LV PW RATIO, ED: 0.94
LA diam end sys: 38 mm
LA diam index: 2.2 cm/m2
LA vol A4C: 59.3 ml
LA vol index: 30.2 mL/m2
LASIZE: 38 mm
LAVOL: 52.3 mL
LDCA: 2.54 cm2
LV E/e'average: 28.1
LV SIMPSON'S DISK: 80
LV TDI E'MEDIAL: 4.74
LV dias vol index: 28 mL/m2
LV sys vol: 10 mL — AB (ref 14–42)
LVDIAVOL: 48 mL (ref 46–106)
LVEEMED: 28.1
LVELAT: 4.84 cm/s
LVOT SV: 78 mL
LVOT VTI: 30.9 cm
LVOT peak grad rest: 8 mmHg
LVOTD: 18 mm
LVOTPV: 145 cm/s
LVSYSVOLIN: 6 mL/m2
MV Dec: 386
MV Peak grad: 7 mmHg
MV pk E vel: 136 m/s
MVPKAVEL: 167 m/s
PW: 10.4 mm — AB (ref 0.6–1.1)
RV LATERAL S' VELOCITY: 11.8 cm/s
RV TAPSE: 19.6 mm
RV sys press: 36 mmHg
Reg peak vel: 266 cm/s
Stroke v: 39 ml
TDI e' lateral: 4.84
TRMAXVEL: 266 cm/s

## 2017-12-19 DIAGNOSIS — N186 End stage renal disease: Secondary | ICD-10-CM | POA: Diagnosis not present

## 2017-12-19 DIAGNOSIS — Z992 Dependence on renal dialysis: Secondary | ICD-10-CM | POA: Diagnosis not present

## 2017-12-19 DIAGNOSIS — D631 Anemia in chronic kidney disease: Secondary | ICD-10-CM | POA: Diagnosis not present

## 2017-12-19 DIAGNOSIS — N2581 Secondary hyperparathyroidism of renal origin: Secondary | ICD-10-CM | POA: Diagnosis not present

## 2017-12-19 DIAGNOSIS — D509 Iron deficiency anemia, unspecified: Secondary | ICD-10-CM | POA: Diagnosis not present

## 2017-12-21 DIAGNOSIS — Z992 Dependence on renal dialysis: Secondary | ICD-10-CM | POA: Diagnosis not present

## 2017-12-21 DIAGNOSIS — D631 Anemia in chronic kidney disease: Secondary | ICD-10-CM | POA: Diagnosis not present

## 2017-12-21 DIAGNOSIS — N186 End stage renal disease: Secondary | ICD-10-CM | POA: Diagnosis not present

## 2017-12-21 DIAGNOSIS — N2581 Secondary hyperparathyroidism of renal origin: Secondary | ICD-10-CM | POA: Diagnosis not present

## 2017-12-21 DIAGNOSIS — D509 Iron deficiency anemia, unspecified: Secondary | ICD-10-CM | POA: Diagnosis not present

## 2017-12-23 ENCOUNTER — Telehealth: Payer: Self-pay | Admitting: *Deleted

## 2017-12-23 NOTE — Telephone Encounter (Signed)
Notes recorded by Laurine Blazer, LPN on 0/12/1113 at 5:20 PM EST Patient notified. Copy to pmd. Follow up scheduled for April with Dr. Bronson Ing.   ------  Notes recorded by Herminio Commons, MD on 12/19/2017 at 9:52 AM EST Normal pumping function. Thick muscular walls indicative of longstanding high blood pressure. Some calcium buildup on mitral valve with mild valve narrowing and some leakage.

## 2017-12-24 DIAGNOSIS — N2581 Secondary hyperparathyroidism of renal origin: Secondary | ICD-10-CM | POA: Diagnosis not present

## 2017-12-24 DIAGNOSIS — N186 End stage renal disease: Secondary | ICD-10-CM | POA: Diagnosis not present

## 2017-12-24 DIAGNOSIS — Z992 Dependence on renal dialysis: Secondary | ICD-10-CM | POA: Diagnosis not present

## 2017-12-24 DIAGNOSIS — D509 Iron deficiency anemia, unspecified: Secondary | ICD-10-CM | POA: Diagnosis not present

## 2017-12-24 DIAGNOSIS — D631 Anemia in chronic kidney disease: Secondary | ICD-10-CM | POA: Diagnosis not present

## 2017-12-26 DIAGNOSIS — N186 End stage renal disease: Secondary | ICD-10-CM | POA: Diagnosis not present

## 2017-12-26 DIAGNOSIS — D631 Anemia in chronic kidney disease: Secondary | ICD-10-CM | POA: Diagnosis not present

## 2017-12-26 DIAGNOSIS — N2581 Secondary hyperparathyroidism of renal origin: Secondary | ICD-10-CM | POA: Diagnosis not present

## 2017-12-26 DIAGNOSIS — Z992 Dependence on renal dialysis: Secondary | ICD-10-CM | POA: Diagnosis not present

## 2017-12-26 DIAGNOSIS — D509 Iron deficiency anemia, unspecified: Secondary | ICD-10-CM | POA: Diagnosis not present

## 2017-12-28 DIAGNOSIS — R002 Palpitations: Secondary | ICD-10-CM | POA: Diagnosis not present

## 2017-12-28 DIAGNOSIS — N186 End stage renal disease: Secondary | ICD-10-CM | POA: Diagnosis not present

## 2017-12-28 DIAGNOSIS — D631 Anemia in chronic kidney disease: Secondary | ICD-10-CM | POA: Diagnosis not present

## 2017-12-28 DIAGNOSIS — N2581 Secondary hyperparathyroidism of renal origin: Secondary | ICD-10-CM | POA: Diagnosis not present

## 2017-12-28 DIAGNOSIS — Z992 Dependence on renal dialysis: Secondary | ICD-10-CM | POA: Diagnosis not present

## 2017-12-28 DIAGNOSIS — D509 Iron deficiency anemia, unspecified: Secondary | ICD-10-CM | POA: Diagnosis not present

## 2017-12-30 ENCOUNTER — Ambulatory Visit (INDEPENDENT_AMBULATORY_CARE_PROVIDER_SITE_OTHER): Payer: Medicare Other

## 2017-12-30 ENCOUNTER — Ambulatory Visit: Payer: Medicare Other

## 2017-12-30 DIAGNOSIS — R002 Palpitations: Secondary | ICD-10-CM

## 2017-12-31 DIAGNOSIS — Z992 Dependence on renal dialysis: Secondary | ICD-10-CM | POA: Diagnosis not present

## 2017-12-31 DIAGNOSIS — D631 Anemia in chronic kidney disease: Secondary | ICD-10-CM | POA: Diagnosis not present

## 2017-12-31 DIAGNOSIS — N2581 Secondary hyperparathyroidism of renal origin: Secondary | ICD-10-CM | POA: Diagnosis not present

## 2017-12-31 DIAGNOSIS — N186 End stage renal disease: Secondary | ICD-10-CM | POA: Diagnosis not present

## 2017-12-31 DIAGNOSIS — D509 Iron deficiency anemia, unspecified: Secondary | ICD-10-CM | POA: Diagnosis not present

## 2018-01-01 ENCOUNTER — Encounter: Payer: Self-pay | Admitting: Vascular Surgery

## 2018-01-01 ENCOUNTER — Ambulatory Visit (INDEPENDENT_AMBULATORY_CARE_PROVIDER_SITE_OTHER): Payer: Medicare Other | Admitting: Vascular Surgery

## 2018-01-01 VITALS — BP 147/95 | HR 86 | Temp 97.8°F | Resp 16 | Ht 64.0 in | Wt 144.0 lb

## 2018-01-01 DIAGNOSIS — N186 End stage renal disease: Secondary | ICD-10-CM | POA: Diagnosis not present

## 2018-01-01 DIAGNOSIS — Z992 Dependence on renal dialysis: Secondary | ICD-10-CM

## 2018-01-01 NOTE — Progress Notes (Signed)
Patient name: [name] MRN: [id number] DOB: 10-19-1976 Sex: female  REASON FOR VISIT:   Follow-up of left upper arm graft.  HPI:   [name] is a pleasant 42 y.o. female who presented with a poorly functioning left upper arm loop AV graft.  She underwent a fistulogram on 12/02/2017 with drug-coated balloon angioplasty of a venous anastomotic stenosis.  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  The remainder of the graft had mild diffuse disease and some calcific disease but no focal stenosis.  She comes in for a follow-up visit.  She states that the graft has been working better.  She dialyzes on Tuesdays Thursdays and Saturdays.  She has no pain or paresthesias in her left arm.  Current Outpatient Medications  Medication Sig Dispense Refill  . acetaminophen (TYLENOL) 500 MG tablet Take 500 mg by mouth daily as needed for headache.     . calcium acetate (PHOSLO) 667 MG capsule Take 1,334-2,001 mg by mouth See admin instructions. Take 2001 mg with each meal and 1334 mg with each snack    . diltiazem (CARDIZEM CD) 120 MG 24 hr capsule Take 240 mg by mouth at bedtime.    Marland Kitchen lisinopril (PRINIVIL,ZESTRIL) 40 MG tablet Take 40 mg by mouth at bedtime.     . Multiple Vitamin (MULTIVITAMIN WITH MINERALS) TABS tablet Take 1 tablet by mouth daily.    . pantoprazole (PROTONIX) 40 MG tablet Take 1 tablet (40 mg total) by mouth daily. 30 tablet 3  . PARoxetine (PAXIL) 20 MG tablet Take 20 mg by mouth daily.    . predniSONE (DELTASONE) 10 MG tablet Take 1 tablet (10 mg total) by mouth daily with breakfast. 90 tablet 3  . traZODone (DESYREL) 50 MG tablet Take 0.5-1 tablets (25-50 mg total) by mouth at bedtime. (Patient taking differently: Take 25 mg by mouth at bedtime. ) 90 tablet 3   No current facility-administered medications for this visit.     REVIEW OF SYSTEMS:  [X]  denotes positive finding, [ ]  denotes negative finding Cardiac  Comments:  Chest  pain or chest pressure:    Shortness of breath upon exertion:    Short of breath when lying flat:    Irregular heart rhythm:    Constitutional    Fever or chills:     PHYSICAL EXAM:   Vitals:   01/01/18 1614 01/01/18 1619  BP: (!) 155/97 (!) 147/95  Pulse: 81 86  Resp: 16   Temp: 97.8 F (36.6 C)   TempSrc: Oral   SpO2: 99%   Weight: 144 lb (65.3 kg)   Height: 5\' 4"  (1.626 m)     GENERAL: The patient is a well-nourished female, in no acute distress. The vital signs are documented above. CARDIOVASCULAR: There is a regular rate and rhythm. PULMONARY: There is good air exchange bilaterally without wheezing or rales. Her left upper arm graft has an excellent thrill and bruit.  DATA:   FISTULOGRAM: I reviewed her fistulogram which shows significant improvement of the venous outflow stenosis after venoplasty.  MEDICAL ISSUES:   STATUS POST VENOPLASTY OF LEFT UPPER ARM AV GRAFT: The graft is working well now.  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  In the future if this enlarges we could consider replacing this half of the graft.  She does have stents in the venous half of the graft may limit future surgical options.  [name]  [facility] [employer] [phone]

## 2018-01-01 NOTE — Progress Notes (Signed)
Vitals:   01/01/18 1614  BP: (!) 155/97  Pulse: 81  Resp: 16  Temp: 97.8 F (36.6 C)  TempSrc: Oral  SpO2: 99%  Weight: 144 lb (65.3 kg)  Height: 5\' 4"  (1.626 m)

## 2018-01-02 DIAGNOSIS — N186 End stage renal disease: Secondary | ICD-10-CM | POA: Diagnosis not present

## 2018-01-02 DIAGNOSIS — N2581 Secondary hyperparathyroidism of renal origin: Secondary | ICD-10-CM | POA: Diagnosis not present

## 2018-01-02 DIAGNOSIS — D631 Anemia in chronic kidney disease: Secondary | ICD-10-CM | POA: Diagnosis not present

## 2018-01-02 DIAGNOSIS — Z992 Dependence on renal dialysis: Secondary | ICD-10-CM | POA: Diagnosis not present

## 2018-01-02 DIAGNOSIS — D509 Iron deficiency anemia, unspecified: Secondary | ICD-10-CM | POA: Diagnosis not present

## 2018-01-03 ENCOUNTER — Other Ambulatory Visit: Payer: Self-pay | Admitting: Internal Medicine

## 2018-01-03 MED ORDER — PAROXETINE HCL 20 MG PO TABS: 20.0000 mg | ORAL_TABLET | Freq: Every day | ORAL | 3 refills | Status: DC

## 2018-01-03 NOTE — Telephone Encounter (Signed)
Pt is not being seen by Dr. Meda Coffee at [facility] care. Please advise

## 2018-01-03 NOTE — Telephone Encounter (Signed)
Copied from [place] [id number]. Topic: Quick Communication - Rx Refill/Question >> Jan 03, 2018  3:01 PM Ahmed Prima L wrote: Medication:  PARoxetine (PAXIL) 20 MG tablet  Has the patient contacted their pharmacy? yes   (Agent: If no, request that the patient contact the pharmacy for the refill.)  [facility] [phone] - [place], [place] Preferred Pharmacy (with phone number or street name):   Patient said that DR Sharlet Salina prescribed this. I advised her that it was prescribed another provider at [facility]  Agent: Please be advised that RX refills may take up to 3 business days. We ask that you follow-up with your pharmacy.

## 2018-01-03 NOTE — Telephone Encounter (Signed)
Sent in

## 2018-01-03 NOTE — Telephone Encounter (Signed)
Paroxetine(Paxil) refill Last OV: 08/29/17 with Dr. Sharlet Salina Last Refill: Chart is not showing that medication was previously prescribed by Dr. Sharlet Salina but the pt states Dr. Sharlet Salina has prescribed this medication before Pharmacy: [facility] at [address] and [address] in [place]

## 2018-01-04 DIAGNOSIS — D509 Iron deficiency anemia, unspecified: Secondary | ICD-10-CM | POA: Diagnosis not present

## 2018-01-04 DIAGNOSIS — D631 Anemia in chronic kidney disease: Secondary | ICD-10-CM | POA: Diagnosis not present

## 2018-01-04 DIAGNOSIS — Z992 Dependence on renal dialysis: Secondary | ICD-10-CM | POA: Diagnosis not present

## 2018-01-04 DIAGNOSIS — N2581 Secondary hyperparathyroidism of renal origin: Secondary | ICD-10-CM | POA: Diagnosis not present

## 2018-01-04 DIAGNOSIS — N186 End stage renal disease: Secondary | ICD-10-CM | POA: Diagnosis not present

## 2018-01-07 DIAGNOSIS — N186 End stage renal disease: Secondary | ICD-10-CM | POA: Diagnosis not present

## 2018-01-07 DIAGNOSIS — D509 Iron deficiency anemia, unspecified: Secondary | ICD-10-CM | POA: Diagnosis not present

## 2018-01-07 DIAGNOSIS — D631 Anemia in chronic kidney disease: Secondary | ICD-10-CM | POA: Diagnosis not present

## 2018-01-07 DIAGNOSIS — Z992 Dependence on renal dialysis: Secondary | ICD-10-CM | POA: Diagnosis not present

## 2018-01-07 DIAGNOSIS — N2581 Secondary hyperparathyroidism of renal origin: Secondary | ICD-10-CM | POA: Diagnosis not present

## 2018-01-09 DIAGNOSIS — Z992 Dependence on renal dialysis: Secondary | ICD-10-CM | POA: Diagnosis not present

## 2018-01-09 DIAGNOSIS — N2581 Secondary hyperparathyroidism of renal origin: Secondary | ICD-10-CM | POA: Diagnosis not present

## 2018-01-09 DIAGNOSIS — D509 Iron deficiency anemia, unspecified: Secondary | ICD-10-CM | POA: Diagnosis not present

## 2018-01-09 DIAGNOSIS — D631 Anemia in chronic kidney disease: Secondary | ICD-10-CM | POA: Diagnosis not present

## 2018-01-09 DIAGNOSIS — N186 End stage renal disease: Secondary | ICD-10-CM | POA: Diagnosis not present

## 2018-01-11 DIAGNOSIS — Z992 Dependence on renal dialysis: Secondary | ICD-10-CM | POA: Diagnosis not present

## 2018-01-11 DIAGNOSIS — D631 Anemia in chronic kidney disease: Secondary | ICD-10-CM | POA: Diagnosis not present

## 2018-01-11 DIAGNOSIS — N186 End stage renal disease: Secondary | ICD-10-CM | POA: Diagnosis not present

## 2018-01-11 DIAGNOSIS — N2581 Secondary hyperparathyroidism of renal origin: Secondary | ICD-10-CM | POA: Diagnosis not present

## 2018-01-11 DIAGNOSIS — D509 Iron deficiency anemia, unspecified: Secondary | ICD-10-CM | POA: Diagnosis not present

## 2018-01-14 DIAGNOSIS — N2581 Secondary hyperparathyroidism of renal origin: Secondary | ICD-10-CM | POA: Diagnosis not present

## 2018-01-14 DIAGNOSIS — N186 End stage renal disease: Secondary | ICD-10-CM | POA: Diagnosis not present

## 2018-01-14 DIAGNOSIS — D631 Anemia in chronic kidney disease: Secondary | ICD-10-CM | POA: Diagnosis not present

## 2018-01-14 DIAGNOSIS — D509 Iron deficiency anemia, unspecified: Secondary | ICD-10-CM | POA: Diagnosis not present

## 2018-01-14 DIAGNOSIS — Z992 Dependence on renal dialysis: Secondary | ICD-10-CM | POA: Diagnosis not present

## 2018-01-16 DIAGNOSIS — D509 Iron deficiency anemia, unspecified: Secondary | ICD-10-CM | POA: Diagnosis not present

## 2018-01-16 DIAGNOSIS — N2581 Secondary hyperparathyroidism of renal origin: Secondary | ICD-10-CM | POA: Diagnosis not present

## 2018-01-16 DIAGNOSIS — D631 Anemia in chronic kidney disease: Secondary | ICD-10-CM | POA: Diagnosis not present

## 2018-01-16 DIAGNOSIS — Z992 Dependence on renal dialysis: Secondary | ICD-10-CM | POA: Diagnosis not present

## 2018-01-16 DIAGNOSIS — N186 End stage renal disease: Secondary | ICD-10-CM | POA: Diagnosis not present

## 2018-01-18 DIAGNOSIS — D631 Anemia in chronic kidney disease: Secondary | ICD-10-CM | POA: Diagnosis not present

## 2018-01-18 DIAGNOSIS — N2581 Secondary hyperparathyroidism of renal origin: Secondary | ICD-10-CM | POA: Diagnosis not present

## 2018-01-18 DIAGNOSIS — Z992 Dependence on renal dialysis: Secondary | ICD-10-CM | POA: Diagnosis not present

## 2018-01-18 DIAGNOSIS — D509 Iron deficiency anemia, unspecified: Secondary | ICD-10-CM | POA: Diagnosis not present

## 2018-01-18 DIAGNOSIS — N186 End stage renal disease: Secondary | ICD-10-CM | POA: Diagnosis not present

## 2018-01-21 DIAGNOSIS — N186 End stage renal disease: Secondary | ICD-10-CM | POA: Diagnosis not present

## 2018-01-21 DIAGNOSIS — N2581 Secondary hyperparathyroidism of renal origin: Secondary | ICD-10-CM | POA: Diagnosis not present

## 2018-01-21 DIAGNOSIS — D631 Anemia in chronic kidney disease: Secondary | ICD-10-CM | POA: Diagnosis not present

## 2018-01-21 DIAGNOSIS — D509 Iron deficiency anemia, unspecified: Secondary | ICD-10-CM | POA: Diagnosis not present

## 2018-01-21 DIAGNOSIS — Z992 Dependence on renal dialysis: Secondary | ICD-10-CM | POA: Diagnosis not present

## 2018-01-22 ENCOUNTER — Telehealth: Payer: Self-pay | Admitting: *Deleted

## 2018-01-22 NOTE — Telephone Encounter (Signed)
Notes recorded by Laurine Blazer, LPN on 01/22/4382 at 7:79 AM EST Patient notified. Copy to pmd. Follow up scheduled for 02/17/18 with Dr. Bronson Ing.   ------  Notes recorded by Herminio Commons, MD on 01/21/2018 at 3:38 PM EST Normal. No abnormal heart rhythm.

## 2018-01-23 DIAGNOSIS — D631 Anemia in chronic kidney disease: Secondary | ICD-10-CM | POA: Diagnosis not present

## 2018-01-23 DIAGNOSIS — N186 End stage renal disease: Secondary | ICD-10-CM | POA: Diagnosis not present

## 2018-01-23 DIAGNOSIS — N2581 Secondary hyperparathyroidism of renal origin: Secondary | ICD-10-CM | POA: Diagnosis not present

## 2018-01-23 DIAGNOSIS — Z992 Dependence on renal dialysis: Secondary | ICD-10-CM | POA: Diagnosis not present

## 2018-01-23 DIAGNOSIS — D509 Iron deficiency anemia, unspecified: Secondary | ICD-10-CM | POA: Diagnosis not present

## 2018-01-25 DIAGNOSIS — Z992 Dependence on renal dialysis: Secondary | ICD-10-CM | POA: Diagnosis not present

## 2018-01-25 DIAGNOSIS — N186 End stage renal disease: Secondary | ICD-10-CM | POA: Diagnosis not present

## 2018-01-25 DIAGNOSIS — D509 Iron deficiency anemia, unspecified: Secondary | ICD-10-CM | POA: Diagnosis not present

## 2018-01-25 DIAGNOSIS — N2581 Secondary hyperparathyroidism of renal origin: Secondary | ICD-10-CM | POA: Diagnosis not present

## 2018-01-25 DIAGNOSIS — D631 Anemia in chronic kidney disease: Secondary | ICD-10-CM | POA: Diagnosis not present

## 2018-01-27 ENCOUNTER — Encounter: Payer: Self-pay | Admitting: Family Medicine

## 2018-01-28 DIAGNOSIS — D509 Iron deficiency anemia, unspecified: Secondary | ICD-10-CM | POA: Diagnosis not present

## 2018-01-28 DIAGNOSIS — N2581 Secondary hyperparathyroidism of renal origin: Secondary | ICD-10-CM | POA: Diagnosis not present

## 2018-01-28 DIAGNOSIS — D631 Anemia in chronic kidney disease: Secondary | ICD-10-CM | POA: Diagnosis not present

## 2018-01-28 DIAGNOSIS — N186 End stage renal disease: Secondary | ICD-10-CM | POA: Diagnosis not present

## 2018-01-28 DIAGNOSIS — Z992 Dependence on renal dialysis: Secondary | ICD-10-CM | POA: Diagnosis not present

## 2018-01-30 DIAGNOSIS — Z992 Dependence on renal dialysis: Secondary | ICD-10-CM | POA: Diagnosis not present

## 2018-01-30 DIAGNOSIS — N2581 Secondary hyperparathyroidism of renal origin: Secondary | ICD-10-CM | POA: Diagnosis not present

## 2018-01-30 DIAGNOSIS — D631 Anemia in chronic kidney disease: Secondary | ICD-10-CM | POA: Diagnosis not present

## 2018-01-30 DIAGNOSIS — N186 End stage renal disease: Secondary | ICD-10-CM | POA: Diagnosis not present

## 2018-01-30 DIAGNOSIS — D509 Iron deficiency anemia, unspecified: Secondary | ICD-10-CM | POA: Diagnosis not present

## 2018-02-01 DIAGNOSIS — D631 Anemia in chronic kidney disease: Secondary | ICD-10-CM | POA: Diagnosis not present

## 2018-02-01 DIAGNOSIS — D509 Iron deficiency anemia, unspecified: Secondary | ICD-10-CM | POA: Diagnosis not present

## 2018-02-01 DIAGNOSIS — N2581 Secondary hyperparathyroidism of renal origin: Secondary | ICD-10-CM | POA: Diagnosis not present

## 2018-02-01 DIAGNOSIS — N186 End stage renal disease: Secondary | ICD-10-CM | POA: Diagnosis not present

## 2018-02-01 DIAGNOSIS — Z992 Dependence on renal dialysis: Secondary | ICD-10-CM | POA: Diagnosis not present

## 2018-02-04 DIAGNOSIS — D509 Iron deficiency anemia, unspecified: Secondary | ICD-10-CM | POA: Diagnosis not present

## 2018-02-04 DIAGNOSIS — D631 Anemia in chronic kidney disease: Secondary | ICD-10-CM | POA: Diagnosis not present

## 2018-02-04 DIAGNOSIS — Z992 Dependence on renal dialysis: Secondary | ICD-10-CM | POA: Diagnosis not present

## 2018-02-04 DIAGNOSIS — N2581 Secondary hyperparathyroidism of renal origin: Secondary | ICD-10-CM | POA: Diagnosis not present

## 2018-02-04 DIAGNOSIS — N186 End stage renal disease: Secondary | ICD-10-CM | POA: Diagnosis not present

## 2018-02-06 DIAGNOSIS — N186 End stage renal disease: Secondary | ICD-10-CM | POA: Diagnosis not present

## 2018-02-06 DIAGNOSIS — Z992 Dependence on renal dialysis: Secondary | ICD-10-CM | POA: Diagnosis not present

## 2018-02-06 DIAGNOSIS — D631 Anemia in chronic kidney disease: Secondary | ICD-10-CM | POA: Diagnosis not present

## 2018-02-06 DIAGNOSIS — N2581 Secondary hyperparathyroidism of renal origin: Secondary | ICD-10-CM | POA: Diagnosis not present

## 2018-02-06 DIAGNOSIS — D509 Iron deficiency anemia, unspecified: Secondary | ICD-10-CM | POA: Diagnosis not present

## 2018-02-08 DIAGNOSIS — N2581 Secondary hyperparathyroidism of renal origin: Secondary | ICD-10-CM | POA: Diagnosis not present

## 2018-02-08 DIAGNOSIS — Z992 Dependence on renal dialysis: Secondary | ICD-10-CM | POA: Diagnosis not present

## 2018-02-08 DIAGNOSIS — N186 End stage renal disease: Secondary | ICD-10-CM | POA: Diagnosis not present

## 2018-02-08 DIAGNOSIS — D509 Iron deficiency anemia, unspecified: Secondary | ICD-10-CM | POA: Diagnosis not present

## 2018-02-08 DIAGNOSIS — D631 Anemia in chronic kidney disease: Secondary | ICD-10-CM | POA: Diagnosis not present

## 2018-02-11 DIAGNOSIS — N2581 Secondary hyperparathyroidism of renal origin: Secondary | ICD-10-CM | POA: Diagnosis not present

## 2018-02-11 DIAGNOSIS — Z992 Dependence on renal dialysis: Secondary | ICD-10-CM | POA: Diagnosis not present

## 2018-02-11 DIAGNOSIS — D631 Anemia in chronic kidney disease: Secondary | ICD-10-CM | POA: Diagnosis not present

## 2018-02-11 DIAGNOSIS — D509 Iron deficiency anemia, unspecified: Secondary | ICD-10-CM | POA: Diagnosis not present

## 2018-02-11 DIAGNOSIS — N186 End stage renal disease: Secondary | ICD-10-CM | POA: Diagnosis not present

## 2018-02-13 DIAGNOSIS — N2581 Secondary hyperparathyroidism of renal origin: Secondary | ICD-10-CM | POA: Diagnosis not present

## 2018-02-13 DIAGNOSIS — D509 Iron deficiency anemia, unspecified: Secondary | ICD-10-CM | POA: Diagnosis not present

## 2018-02-13 DIAGNOSIS — Z992 Dependence on renal dialysis: Secondary | ICD-10-CM | POA: Diagnosis not present

## 2018-02-13 DIAGNOSIS — D631 Anemia in chronic kidney disease: Secondary | ICD-10-CM | POA: Diagnosis not present

## 2018-02-13 DIAGNOSIS — N186 End stage renal disease: Secondary | ICD-10-CM | POA: Diagnosis not present

## 2018-02-15 DIAGNOSIS — D509 Iron deficiency anemia, unspecified: Secondary | ICD-10-CM | POA: Diagnosis not present

## 2018-02-15 DIAGNOSIS — N186 End stage renal disease: Secondary | ICD-10-CM | POA: Diagnosis not present

## 2018-02-15 DIAGNOSIS — Z992 Dependence on renal dialysis: Secondary | ICD-10-CM | POA: Diagnosis not present

## 2018-02-15 DIAGNOSIS — N2581 Secondary hyperparathyroidism of renal origin: Secondary | ICD-10-CM | POA: Diagnosis not present

## 2018-02-15 DIAGNOSIS — D631 Anemia in chronic kidney disease: Secondary | ICD-10-CM | POA: Diagnosis not present

## 2018-02-16 DIAGNOSIS — N186 End stage renal disease: Secondary | ICD-10-CM | POA: Diagnosis not present

## 2018-02-16 DIAGNOSIS — Z992 Dependence on renal dialysis: Secondary | ICD-10-CM | POA: Diagnosis not present

## 2018-02-17 ENCOUNTER — Encounter: Payer: Self-pay | Admitting: Cardiovascular Disease

## 2018-02-17 ENCOUNTER — Ambulatory Visit: Payer: Self-pay | Admitting: Family Medicine

## 2018-02-17 ENCOUNTER — Ambulatory Visit (INDEPENDENT_AMBULATORY_CARE_PROVIDER_SITE_OTHER): Payer: Medicare Other | Admitting: Cardiovascular Disease

## 2018-02-17 VITALS — BP 165/103 | HR 86 | Ht 64.0 in | Wt 145.6 lb

## 2018-02-17 DIAGNOSIS — I10 Essential (primary) hypertension: Secondary | ICD-10-CM

## 2018-02-17 DIAGNOSIS — I119 Hypertensive heart disease without heart failure: Secondary | ICD-10-CM

## 2018-02-17 DIAGNOSIS — I059 Rheumatic mitral valve disease, unspecified: Secondary | ICD-10-CM | POA: Diagnosis not present

## 2018-02-17 DIAGNOSIS — R002 Palpitations: Secondary | ICD-10-CM | POA: Diagnosis not present

## 2018-02-17 MED ORDER — HYDRALAZINE HCL 25 MG PO TABS: 25.0000 mg | ORAL_TABLET | Freq: Two times a day (BID) | ORAL | 11 refills | Status: DC

## 2018-02-17 NOTE — Progress Notes (Signed)
SUBJECTIVE: The patient returns for follow-up after undergoing cardiovascular testing performed for the evaluation of palpitations.  Past medical history includes systemic lupus with proptosis and end-stage renal disease for which she is on hemodialysis.  Event monitoring demonstrated sinus rhythm with rare PACs.  Echocardiogram demonstrated normal left ventricular systolic function, LVEF 27-06%, severe LVH with grade 1 diastolic dysfunction and high ventricular filling pressures, moderate to severe mitral annular calcification with mild mitral stenosis and mild to moderate mitral regurgitation.  There is mild to moderate left atrial dilatation.  Pulmonary pressures were only mildly increased.  The patient denies any symptoms of chest pain, palpitations, shortness of breath, lightheadedness, dizziness, leg swelling, orthopnea, PND, and syncope.     Review of Systems: As per "subjective", otherwise negative.  Allergies  Allergen Reactions  . Amlodipine Hives    Swelling  . Sulfa Antibiotics Hives and Itching  . Vancomycin Hives and Itching    Current Outpatient Medications  Medication Sig Dispense Refill  . acetaminophen (TYLENOL) 500 MG tablet Take 500 mg by mouth daily as needed for headache.     . calcium acetate (PHOSLO) 667 MG capsule Take 1,334-2,001 mg by mouth See admin instructions. Take 2001 mg with each meal and 1334 mg with each snack    . diltiazem (CARDIZEM CD) 120 MG 24 hr capsule Take 240 mg by mouth at bedtime.    Marland Kitchen lisinopril (PRINIVIL,ZESTRIL) 40 MG tablet Take 40 mg by mouth at bedtime.     . Multiple Vitamin (MULTIVITAMIN WITH MINERALS) TABS tablet Take 1 tablet by mouth daily.    . pantoprazole (PROTONIX) 40 MG tablet Take 1 tablet (40 mg total) by mouth daily. 30 tablet 3  . PARoxetine (PAXIL) 20 MG tablet Take 1 tablet (20 mg total) by mouth daily. 90 tablet 3  . predniSONE (DELTASONE) 10 MG tablet Take 1 tablet (10 mg total) by mouth daily with  breakfast. 90 tablet 3  . traZODone (DESYREL) 50 MG tablet Take 0.5-1 tablets (25-50 mg total) by mouth at bedtime. (Patient taking differently: Take 25 mg by mouth at bedtime. ) 90 tablet 3   No current facility-administered medications for this visit.     Past Medical History:  Diagnosis Date  . Allergy   . Anemia   . Avascular necrosis of bone (HCC)    left tibial talus due to chronic prednisone use  . 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 GERD (gastroesophageal reflux disease)   . Headache(784.0)   . Hyperlipemia   . Hypertension   . Lupus ([place])   . Secondary hyperparathyroidism (of renal origin)     Past Surgical History:  Procedure Laterality Date  . A/V FISTULAGRAM Left 12/02/2017   Procedure: A/V FISTULAGRAM - Left upper;  Surgeon: Angelia Mould, MD;  Location: [facility];  Service: Cardiovascular;  Laterality: Left;  . AV FISTULA PLACEMENT Left   . AV FISTULA PLACEMENT Right 01/26/2014   Procedure: ARTERIOVENOUS (AV) FISTULA CREATION; ultrasound guided;  Surgeon: Conrad Merrill, MD;  Location: [facility];  Service: Vascular;  Laterality: Right;  . ESOPHAGOGASTRODUODENOSCOPY N/A 03/17/2016   Procedure: ESOPHAGOGASTRODUODENOSCOPY (EGD);  Surgeon: Irene Shipper, MD;  Location: [facility] ENDOSCOPY;  Service: Endoscopy;  Laterality: N/A;  . HEMATOMA EVACUATION Left 10/09/2013   Procedure: EVACUATION HEMATOMA;  Surgeon: Angelia Mould, MD;  Location: [facility];  Service: Vascular;  Laterality:  Left;  . INCISION AND DRAINAGE ABSCESS     gluteal  . INSERTION OF DIALYSIS CATHETER N/A 10/09/2013   Procedure: INSERTION OF DIALYSIS CATHETER; ULTRASOUND GUIDED;  Surgeon: Angelia Mould, MD;  Location: [facility];  Service: Vascular;  Laterality: N/A;  . PERIPHERAL VASCULAR BALLOON ANGIOPLASTY Left 12/02/2017   Procedure: PERIPHERAL VASCULAR BALLOON ANGIOPLASTY;  Surgeon: Angelia Mould, MD;  Location: [facility];  Service: Cardiovascular;  Laterality: Left;  . SHUNTOGRAM N/A 01/07/2014   Procedure: fistulogram with possibe venoplasty left upper arm avg;  Surgeon: Angelia Mould, MD;  Location: [facility] CATH LAB;  Service: Cardiovascular;  Laterality: N/A;  . tibiocalcaneal fusion  left      Social History   Socioeconomic History  . Marital status: Single    Spouse name: none  . Number of children: 0  . Years of education: 52  . Highest education level: Some college, no degree  Occupational History  . Occupation: unemployed    Comment: disability  Social Needs  . Financial resource strain: Not very hard  . Food insecurity:    Worry: Never true    Inability: Never true  . Transportation needs:    Medical: No    Non-medical: No  Tobacco Use  . Smoking status: Never Smoker  . Smokeless tobacco: Never Used  Substance and Sexual Activity  . Alcohol use: No  . Drug use: No  . Sexual activity: Not Currently  Lifestyle  . Physical activity:    Days per week: 0 days    Minutes per session: 0 min  . Stress: Rather much  Relationships  . Social connections:    Talks on phone: More than three times a week    Gets together: More than three times a week    Attends religious service: Not on file    Active member of club or organization: No    Attends meetings of clubs or organizations: Never    Relationship status: Never married  . Intimate partner violence:    Fear of current or ex partner: Not on file    Emotionally abused: Not on file    Physically abused: Not on file    Forced sexual activity: Not on file  Other Topics Concern  . Not on file  Social History Narrative   Lives alone   [place] to [place] 4 months ago to be closer to family   Previously lived in [place] , more active   Very close to sister tonya     Vitals:   02/17/18 0901  BP: (!) 174/110  Pulse: 93  SpO2: 100%  Weight: 145 lb 9.6 oz (66 kg)  Height: 5\' 4"   (1.626 m)    Wt Readings from Last 3 Encounters:  02/17/18 145 lb 9.6 oz (66 kg)  01/01/18 144 lb (65.3 kg)  12/16/17 149 lb (67.6 kg)     PHYSICAL EXAM General: NAD HEENT: Normal. Neck: No JVD, no thyromegaly. Lungs: Clear to auscultation bilaterally with normal respiratory effort. CV: Regular rate and rhythm, normal S1/S2, no G2/R4, 2/6 systolic murmur loudest over bilateral upper sternal borders.  Bilateral 1+ lower extremity edema.  No carotid bruit.   Abdomen: Soft, nontender, no distention.  Neurologic: Alert and oriented.  Psych: Normal affect. Skin: Normal. Musculoskeletal: No gross deformities.    ECG: Most recent ECG reviewed.   Labs: Lab Results  Component Value Date/Time   K 4.0 12/02/2017 08:40 AM   BUN 62 (H) 12/02/2017 08:40 AM  CREATININE 8.40 (H) 12/02/2017 08:40 AM   ALT 11 06/13/2011 05:30 AM   TSH 1.852 06/08/2011 02:37 PM   HGB 12.2 12/02/2017 08:40 AM     Lipids: No results found for: LDLCALC, LDLDIRECT, CHOL, TRIG, HDL     ASSESSMENT AND PLAN: 1.  Palpitations: Symptomatically stable.  No arrhythmias seen with event monitoring.  Continue long-acting diltiazem 240 mg daily.  Given her hypertensive heart disease and mitral valvular disease along with chronic kidney disease stage V, she is certainly at risk for atrial fibrillation.  2.    Mitral valvular disease with stenosis and regurgitation: Echocardiogram reviewed above.  Symptomatically stable.  I will monitor with routine clinical and echocardiographic surveillance.  3.  Hypertension: Blood pressure is elevated.  Repeat blood pressure check is 165/103.  She is on both lisinopril 40 mg and diltiazem 240 mg daily.  I will start hydralazine 25 mg twice daily.  This can be increased as needed for optimal blood pressure control.  4.  Hypertensive heart disease: Symptomatically stable.  Optimal blood pressure control is of prime importance.  Volume management is achieved with  hemodialysis.     Disposition: Follow up 1 year.   Kate Sable, M.D., F.A.C.C.

## 2018-02-17 NOTE — Patient Instructions (Signed)
Medication Instructions:   Begin Hydralazine 25mg  twice a day.  Continue all other medications.    Labwork: none  Testing/Procedures: none  Follow-Up: Your physician wants you to follow up in:  1 year.  You will receive a reminder letter in the mail one-two months in advance.  If you don't receive a letter, please call our office to schedule the follow up appointment   Any Other Special Instructions Will Be Listed Below (If Applicable).  If you need a refill on your cardiac medications before your next appointment, please call your pharmacy.

## 2018-02-18 ENCOUNTER — Ambulatory Visit: Payer: Self-pay | Admitting: Family Medicine

## 2018-02-18 DIAGNOSIS — N186 End stage renal disease: Secondary | ICD-10-CM | POA: Diagnosis not present

## 2018-02-18 DIAGNOSIS — Z992 Dependence on renal dialysis: Secondary | ICD-10-CM | POA: Diagnosis not present

## 2018-02-18 DIAGNOSIS — D631 Anemia in chronic kidney disease: Secondary | ICD-10-CM | POA: Diagnosis not present

## 2018-02-18 DIAGNOSIS — D509 Iron deficiency anemia, unspecified: Secondary | ICD-10-CM | POA: Diagnosis not present

## 2018-02-18 DIAGNOSIS — N2581 Secondary hyperparathyroidism of renal origin: Secondary | ICD-10-CM | POA: Diagnosis not present

## 2018-02-19 ENCOUNTER — Encounter: Payer: Self-pay | Admitting: Family Medicine

## 2018-02-19 ENCOUNTER — Ambulatory Visit (INDEPENDENT_AMBULATORY_CARE_PROVIDER_SITE_OTHER): Payer: Medicare Other | Admitting: Family Medicine

## 2018-02-19 ENCOUNTER — Other Ambulatory Visit: Payer: Self-pay

## 2018-02-19 VITALS — BP 138/78 | HR 88 | Temp 97.7°F | Ht 64.0 in | Wt 143.0 lb

## 2018-02-19 DIAGNOSIS — I10 Essential (primary) hypertension: Secondary | ICD-10-CM | POA: Diagnosis not present

## 2018-02-19 DIAGNOSIS — G4700 Insomnia, unspecified: Secondary | ICD-10-CM | POA: Diagnosis not present

## 2018-02-19 DIAGNOSIS — M328 Other forms of systemic lupus erythematosus: Secondary | ICD-10-CM | POA: Diagnosis not present

## 2018-02-19 DIAGNOSIS — Z992 Dependence on renal dialysis: Secondary | ICD-10-CM | POA: Diagnosis not present

## 2018-02-19 DIAGNOSIS — N186 End stage renal disease: Secondary | ICD-10-CM | POA: Diagnosis not present

## 2018-02-19 NOTE — Patient Instructions (Signed)
You may take trazodone 25 or 50 mg at night for sleep  Your blood pressure is great  Need follow up every 3 months  Call for refills or peoblems

## 2018-02-19 NOTE — Progress Notes (Signed)
Chief Complaint  Patient presents with  . Follow-up    3 mth   Patient is here for routine follow-up. She continues to receive dialysis 3 days a week. She feels like she is stable with good fluid balance, and good control of her renal disease.  Blood pressure is good.  She is compliant with her medications. On days that she is not on dialysis she is taking a walk and trying to stay active daily. Her appetite is good.  Her weight is stable. She takes trazodone 25 mg at night to help her sleep.  Some nights she still has difficulty sleeping.  She is advised that she may take up to 50 mg on nights where she has trouble.  We discussed sleep in general, avoiding caffeine, regular exercise, avoiding screen time, regular bedtime and waking time, caffeine. I am leaving this practice.  We discussed this and I gave her the names and numbers of physicians who are taking new patients in the area. Her blood pressures been quite elevated.  Today it is normal.  When she saw Dr. Harl Bowie in cardiology on 02/17/18 he added hydralazine to her regimen.  Patient Active Problem List   Diagnosis Date Noted  . Heart palpitations 11/20/2017  . GERD (gastroesophageal reflux disease) 07/10/2016  . Thrombocytopenia ([place]) 03/17/2016  . Duodenal ulcer hemorrhagic   . Esophageal stricture   . Adjustment disorder with mixed anxiety and depressed mood 10/25/2015  . Lupus ([place]) 04/29/2015  . ESRD on dialysis ([place]) 10/09/2013  . Anemia in chronic kidney disease 10/09/2013  . Secondary renal hyperparathyroidism ([place]) 10/09/2013  . Essential hypertension 10/09/2013    Outpatient Encounter Medications as of 02/19/2018  Medication Sig  . acetaminophen (TYLENOL) 500 MG tablet Take 500 mg by mouth daily as needed for headache.   . calcium acetate (PHOSLO) 667 MG capsule Take 1,334-2,001 mg by mouth See admin instructions. Take 2001 mg with each meal and 1334 mg with each snack  . diltiazem (CARDIZEM CD) 120 MG 24 hr  capsule Take 240 mg by mouth at bedtime.  . hydrALAZINE (APRESOLINE) 25 MG tablet Take 1 tablet (25 mg total) by mouth 2 (two) times daily.  Marland Kitchen lisinopril (PRINIVIL,ZESTRIL) 40 MG tablet Take 40 mg by mouth at bedtime.   . Multiple Vitamin (MULTIVITAMIN WITH MINERALS) TABS tablet Take 1 tablet by mouth daily.  . pantoprazole (PROTONIX) 40 MG tablet Take 1 tablet (40 mg total) by mouth daily.  Marland Kitchen PARoxetine (PAXIL) 20 MG tablet Take 1 tablet (20 mg total) by mouth daily.  . predniSONE (DELTASONE) 10 MG tablet Take 1 tablet (10 mg total) by mouth daily with breakfast.  . traZODone (DESYREL) 50 MG tablet Take 0.5-1 tablets (25-50 mg total) by mouth at bedtime. (Patient taking differently: Take 25 mg by mouth at bedtime. )   No facility-administered encounter medications on file as of 02/19/2018.     Allergies  Allergen Reactions  . Amlodipine Hives    Swelling  . Sulfa Antibiotics Hives and Itching  . Vancomycin Hives and Itching    Review of Systems  Constitutional: Negative for activity change, appetite change and unexpected weight change.  HENT: Negative for congestion and dental problem.        Cannot afford dentist  Eyes: Negative for photophobia and visual disturbance.  Respiratory: Negative for cough and choking.   Cardiovascular: Negative for chest pain and palpitations.  Gastrointestinal: Negative for abdominal distention, abdominal pain, constipation and diarrhea.  Genitourinary: Negative for difficulty  urinating.  Musculoskeletal: Negative for arthralgias and back pain.  Neurological: Negative for dizziness and light-headedness.  Psychiatric/Behavioral: Negative for dysphoric mood. The patient is not nervous/anxious.        States that her mood is better since she is living near her family.    BP 138/78   Pulse 88   Temp 97.7 F (36.5 C) (Oral)   Ht 5\' 4"  (1.626 m)   Wt 143 lb 0.6 oz (64.9 kg)   LMP 11/21/2017 Comment: irregular  SpO2 97%   BMI 24.55 kg/m   Physical  Exam  Constitutional: She is oriented to person, place, and time. She appears well-developed and well-nourished. No distress.  HENT:  Head: Normocephalic and atraumatic.  Right Ear: External ear normal.  Left Ear: External ear normal.  Mouth/Throat: Oropharynx is clear and moist.  Needs dental repair  Eyes: Pupils are equal, round, and reactive to light. Conjunctivae are normal.  Mild exophthalmic appearance  Neck: Normal range of motion. No JVD present. No thyromegaly present.  Cardiovascular: Normal rate and regular rhythm.  No murmur heard. Pulmonary/Chest: Effort normal and breath sounds normal. No respiratory distress.  Abdominal: Soft. Bowel sounds are normal. There is no tenderness.  Musculoskeletal: She exhibits no edema.  Neurological: She is alert and oriented to person, place, and time.  Psychiatric: She has a normal mood and affect. Her behavior is normal.    ASSESSMENT/PLAN:  1. Essential hypertension Controlled today  2. ESRD on dialysis ([place]) Stable stable  3. Other forms of systemic lupus erythematosus, unspecified organ involvement status ([place]) stable   Patient Instructions  You may take trazodone 25 or 50 mg at night for sleep  Your blood pressure is great  Need follow up every 3 months  Call for refills or peoblems   Raylene Everts, MD

## 2018-02-20 DIAGNOSIS — D631 Anemia in chronic kidney disease: Secondary | ICD-10-CM | POA: Diagnosis not present

## 2018-02-20 DIAGNOSIS — Z992 Dependence on renal dialysis: Secondary | ICD-10-CM | POA: Diagnosis not present

## 2018-02-20 DIAGNOSIS — D509 Iron deficiency anemia, unspecified: Secondary | ICD-10-CM | POA: Diagnosis not present

## 2018-02-20 DIAGNOSIS — N186 End stage renal disease: Secondary | ICD-10-CM | POA: Diagnosis not present

## 2018-02-20 DIAGNOSIS — N2581 Secondary hyperparathyroidism of renal origin: Secondary | ICD-10-CM | POA: Diagnosis not present

## 2018-02-22 DIAGNOSIS — N2581 Secondary hyperparathyroidism of renal origin: Secondary | ICD-10-CM | POA: Diagnosis not present

## 2018-02-22 DIAGNOSIS — N186 End stage renal disease: Secondary | ICD-10-CM | POA: Diagnosis not present

## 2018-02-22 DIAGNOSIS — Z992 Dependence on renal dialysis: Secondary | ICD-10-CM | POA: Diagnosis not present

## 2018-02-22 DIAGNOSIS — D509 Iron deficiency anemia, unspecified: Secondary | ICD-10-CM | POA: Diagnosis not present

## 2018-02-22 DIAGNOSIS — D631 Anemia in chronic kidney disease: Secondary | ICD-10-CM | POA: Diagnosis not present

## 2018-02-25 DIAGNOSIS — D631 Anemia in chronic kidney disease: Secondary | ICD-10-CM | POA: Diagnosis not present

## 2018-02-25 DIAGNOSIS — N2581 Secondary hyperparathyroidism of renal origin: Secondary | ICD-10-CM | POA: Diagnosis not present

## 2018-02-25 DIAGNOSIS — D509 Iron deficiency anemia, unspecified: Secondary | ICD-10-CM | POA: Diagnosis not present

## 2018-02-25 DIAGNOSIS — Z992 Dependence on renal dialysis: Secondary | ICD-10-CM | POA: Diagnosis not present

## 2018-02-25 DIAGNOSIS — N186 End stage renal disease: Secondary | ICD-10-CM | POA: Diagnosis not present

## 2018-02-27 DIAGNOSIS — Z992 Dependence on renal dialysis: Secondary | ICD-10-CM | POA: Diagnosis not present

## 2018-02-27 DIAGNOSIS — N2581 Secondary hyperparathyroidism of renal origin: Secondary | ICD-10-CM | POA: Diagnosis not present

## 2018-02-27 DIAGNOSIS — D509 Iron deficiency anemia, unspecified: Secondary | ICD-10-CM | POA: Diagnosis not present

## 2018-02-27 DIAGNOSIS — D631 Anemia in chronic kidney disease: Secondary | ICD-10-CM | POA: Diagnosis not present

## 2018-02-27 DIAGNOSIS — N186 End stage renal disease: Secondary | ICD-10-CM | POA: Diagnosis not present

## 2018-03-01 DIAGNOSIS — D509 Iron deficiency anemia, unspecified: Secondary | ICD-10-CM | POA: Diagnosis not present

## 2018-03-01 DIAGNOSIS — N186 End stage renal disease: Secondary | ICD-10-CM | POA: Diagnosis not present

## 2018-03-01 DIAGNOSIS — Z992 Dependence on renal dialysis: Secondary | ICD-10-CM | POA: Diagnosis not present

## 2018-03-01 DIAGNOSIS — D631 Anemia in chronic kidney disease: Secondary | ICD-10-CM | POA: Diagnosis not present

## 2018-03-01 DIAGNOSIS — N2581 Secondary hyperparathyroidism of renal origin: Secondary | ICD-10-CM | POA: Diagnosis not present

## 2018-03-04 DIAGNOSIS — N2581 Secondary hyperparathyroidism of renal origin: Secondary | ICD-10-CM | POA: Diagnosis not present

## 2018-03-04 DIAGNOSIS — D631 Anemia in chronic kidney disease: Secondary | ICD-10-CM | POA: Diagnosis not present

## 2018-03-04 DIAGNOSIS — N186 End stage renal disease: Secondary | ICD-10-CM | POA: Diagnosis not present

## 2018-03-04 DIAGNOSIS — Z992 Dependence on renal dialysis: Secondary | ICD-10-CM | POA: Diagnosis not present

## 2018-03-04 DIAGNOSIS — D509 Iron deficiency anemia, unspecified: Secondary | ICD-10-CM | POA: Diagnosis not present

## 2018-03-06 DIAGNOSIS — D509 Iron deficiency anemia, unspecified: Secondary | ICD-10-CM | POA: Diagnosis not present

## 2018-03-06 DIAGNOSIS — N186 End stage renal disease: Secondary | ICD-10-CM | POA: Diagnosis not present

## 2018-03-06 DIAGNOSIS — N2581 Secondary hyperparathyroidism of renal origin: Secondary | ICD-10-CM | POA: Diagnosis not present

## 2018-03-06 DIAGNOSIS — D631 Anemia in chronic kidney disease: Secondary | ICD-10-CM | POA: Diagnosis not present

## 2018-03-06 DIAGNOSIS — Z992 Dependence on renal dialysis: Secondary | ICD-10-CM | POA: Diagnosis not present

## 2018-03-08 DIAGNOSIS — D631 Anemia in chronic kidney disease: Secondary | ICD-10-CM | POA: Diagnosis not present

## 2018-03-08 DIAGNOSIS — N2581 Secondary hyperparathyroidism of renal origin: Secondary | ICD-10-CM | POA: Diagnosis not present

## 2018-03-08 DIAGNOSIS — N186 End stage renal disease: Secondary | ICD-10-CM | POA: Diagnosis not present

## 2018-03-08 DIAGNOSIS — Z992 Dependence on renal dialysis: Secondary | ICD-10-CM | POA: Diagnosis not present

## 2018-03-08 DIAGNOSIS — D509 Iron deficiency anemia, unspecified: Secondary | ICD-10-CM | POA: Diagnosis not present

## 2018-03-11 DIAGNOSIS — Z992 Dependence on renal dialysis: Secondary | ICD-10-CM | POA: Diagnosis not present

## 2018-03-11 DIAGNOSIS — D509 Iron deficiency anemia, unspecified: Secondary | ICD-10-CM | POA: Diagnosis not present

## 2018-03-11 DIAGNOSIS — D631 Anemia in chronic kidney disease: Secondary | ICD-10-CM | POA: Diagnosis not present

## 2018-03-11 DIAGNOSIS — N186 End stage renal disease: Secondary | ICD-10-CM | POA: Diagnosis not present

## 2018-03-11 DIAGNOSIS — N2581 Secondary hyperparathyroidism of renal origin: Secondary | ICD-10-CM | POA: Diagnosis not present

## 2018-03-13 DIAGNOSIS — N186 End stage renal disease: Secondary | ICD-10-CM | POA: Diagnosis not present

## 2018-03-13 DIAGNOSIS — N2581 Secondary hyperparathyroidism of renal origin: Secondary | ICD-10-CM | POA: Diagnosis not present

## 2018-03-13 DIAGNOSIS — D631 Anemia in chronic kidney disease: Secondary | ICD-10-CM | POA: Diagnosis not present

## 2018-03-13 DIAGNOSIS — D509 Iron deficiency anemia, unspecified: Secondary | ICD-10-CM | POA: Diagnosis not present

## 2018-03-13 DIAGNOSIS — Z992 Dependence on renal dialysis: Secondary | ICD-10-CM | POA: Diagnosis not present

## 2018-03-15 DIAGNOSIS — Z992 Dependence on renal dialysis: Secondary | ICD-10-CM | POA: Diagnosis not present

## 2018-03-15 DIAGNOSIS — D509 Iron deficiency anemia, unspecified: Secondary | ICD-10-CM | POA: Diagnosis not present

## 2018-03-15 DIAGNOSIS — N186 End stage renal disease: Secondary | ICD-10-CM | POA: Diagnosis not present

## 2018-03-15 DIAGNOSIS — D631 Anemia in chronic kidney disease: Secondary | ICD-10-CM | POA: Diagnosis not present

## 2018-03-15 DIAGNOSIS — N2581 Secondary hyperparathyroidism of renal origin: Secondary | ICD-10-CM | POA: Diagnosis not present

## 2018-03-18 DIAGNOSIS — D509 Iron deficiency anemia, unspecified: Secondary | ICD-10-CM | POA: Diagnosis not present

## 2018-03-18 DIAGNOSIS — D631 Anemia in chronic kidney disease: Secondary | ICD-10-CM | POA: Diagnosis not present

## 2018-03-18 DIAGNOSIS — Z992 Dependence on renal dialysis: Secondary | ICD-10-CM | POA: Diagnosis not present

## 2018-03-18 DIAGNOSIS — N186 End stage renal disease: Secondary | ICD-10-CM | POA: Diagnosis not present

## 2018-03-18 DIAGNOSIS — N2581 Secondary hyperparathyroidism of renal origin: Secondary | ICD-10-CM | POA: Diagnosis not present

## 2018-03-20 DIAGNOSIS — D509 Iron deficiency anemia, unspecified: Secondary | ICD-10-CM | POA: Diagnosis not present

## 2018-03-20 DIAGNOSIS — Z992 Dependence on renal dialysis: Secondary | ICD-10-CM | POA: Diagnosis not present

## 2018-03-20 DIAGNOSIS — D631 Anemia in chronic kidney disease: Secondary | ICD-10-CM | POA: Diagnosis not present

## 2018-03-20 DIAGNOSIS — N186 End stage renal disease: Secondary | ICD-10-CM | POA: Diagnosis not present

## 2018-03-22 DIAGNOSIS — Z992 Dependence on renal dialysis: Secondary | ICD-10-CM | POA: Diagnosis not present

## 2018-03-22 DIAGNOSIS — D631 Anemia in chronic kidney disease: Secondary | ICD-10-CM | POA: Diagnosis not present

## 2018-03-22 DIAGNOSIS — D509 Iron deficiency anemia, unspecified: Secondary | ICD-10-CM | POA: Diagnosis not present

## 2018-03-22 DIAGNOSIS — N186 End stage renal disease: Secondary | ICD-10-CM | POA: Diagnosis not present

## 2018-03-25 DIAGNOSIS — D509 Iron deficiency anemia, unspecified: Secondary | ICD-10-CM | POA: Diagnosis not present

## 2018-03-25 DIAGNOSIS — N186 End stage renal disease: Secondary | ICD-10-CM | POA: Diagnosis not present

## 2018-03-25 DIAGNOSIS — D631 Anemia in chronic kidney disease: Secondary | ICD-10-CM | POA: Diagnosis not present

## 2018-03-25 DIAGNOSIS — Z992 Dependence on renal dialysis: Secondary | ICD-10-CM | POA: Diagnosis not present

## 2018-03-27 DIAGNOSIS — D509 Iron deficiency anemia, unspecified: Secondary | ICD-10-CM | POA: Diagnosis not present

## 2018-03-27 DIAGNOSIS — Z992 Dependence on renal dialysis: Secondary | ICD-10-CM | POA: Diagnosis not present

## 2018-03-27 DIAGNOSIS — N186 End stage renal disease: Secondary | ICD-10-CM | POA: Diagnosis not present

## 2018-03-27 DIAGNOSIS — D631 Anemia in chronic kidney disease: Secondary | ICD-10-CM | POA: Diagnosis not present

## 2018-03-29 DIAGNOSIS — Z992 Dependence on renal dialysis: Secondary | ICD-10-CM | POA: Diagnosis not present

## 2018-03-29 DIAGNOSIS — D509 Iron deficiency anemia, unspecified: Secondary | ICD-10-CM | POA: Diagnosis not present

## 2018-03-29 DIAGNOSIS — D631 Anemia in chronic kidney disease: Secondary | ICD-10-CM | POA: Diagnosis not present

## 2018-03-29 DIAGNOSIS — N186 End stage renal disease: Secondary | ICD-10-CM | POA: Diagnosis not present

## 2018-04-01 DIAGNOSIS — D631 Anemia in chronic kidney disease: Secondary | ICD-10-CM | POA: Diagnosis not present

## 2018-04-01 DIAGNOSIS — D509 Iron deficiency anemia, unspecified: Secondary | ICD-10-CM | POA: Diagnosis not present

## 2018-04-01 DIAGNOSIS — Z992 Dependence on renal dialysis: Secondary | ICD-10-CM | POA: Diagnosis not present

## 2018-04-01 DIAGNOSIS — N186 End stage renal disease: Secondary | ICD-10-CM | POA: Diagnosis not present

## 2018-04-03 DIAGNOSIS — D631 Anemia in chronic kidney disease: Secondary | ICD-10-CM | POA: Diagnosis not present

## 2018-04-03 DIAGNOSIS — D509 Iron deficiency anemia, unspecified: Secondary | ICD-10-CM | POA: Diagnosis not present

## 2018-04-03 DIAGNOSIS — Z992 Dependence on renal dialysis: Secondary | ICD-10-CM | POA: Diagnosis not present

## 2018-04-03 DIAGNOSIS — N186 End stage renal disease: Secondary | ICD-10-CM | POA: Diagnosis not present

## 2018-04-04 DIAGNOSIS — D631 Anemia in chronic kidney disease: Secondary | ICD-10-CM | POA: Diagnosis not present

## 2018-04-04 DIAGNOSIS — N186 End stage renal disease: Secondary | ICD-10-CM | POA: Diagnosis not present

## 2018-04-04 DIAGNOSIS — D509 Iron deficiency anemia, unspecified: Secondary | ICD-10-CM | POA: Diagnosis not present

## 2018-04-04 DIAGNOSIS — Z992 Dependence on renal dialysis: Secondary | ICD-10-CM | POA: Diagnosis not present

## 2018-04-07 DIAGNOSIS — D631 Anemia in chronic kidney disease: Secondary | ICD-10-CM | POA: Diagnosis not present

## 2018-04-07 DIAGNOSIS — N186 End stage renal disease: Secondary | ICD-10-CM | POA: Diagnosis not present

## 2018-04-07 DIAGNOSIS — D509 Iron deficiency anemia, unspecified: Secondary | ICD-10-CM | POA: Diagnosis not present

## 2018-04-07 DIAGNOSIS — Z992 Dependence on renal dialysis: Secondary | ICD-10-CM | POA: Diagnosis not present

## 2018-04-10 DIAGNOSIS — N186 End stage renal disease: Secondary | ICD-10-CM | POA: Diagnosis not present

## 2018-04-10 DIAGNOSIS — D509 Iron deficiency anemia, unspecified: Secondary | ICD-10-CM | POA: Diagnosis not present

## 2018-04-10 DIAGNOSIS — D631 Anemia in chronic kidney disease: Secondary | ICD-10-CM | POA: Diagnosis not present

## 2018-04-10 DIAGNOSIS — Z992 Dependence on renal dialysis: Secondary | ICD-10-CM | POA: Diagnosis not present

## 2018-04-12 DIAGNOSIS — D509 Iron deficiency anemia, unspecified: Secondary | ICD-10-CM | POA: Diagnosis not present

## 2018-04-12 DIAGNOSIS — D631 Anemia in chronic kidney disease: Secondary | ICD-10-CM | POA: Diagnosis not present

## 2018-04-12 DIAGNOSIS — Z992 Dependence on renal dialysis: Secondary | ICD-10-CM | POA: Diagnosis not present

## 2018-04-12 DIAGNOSIS — N186 End stage renal disease: Secondary | ICD-10-CM | POA: Diagnosis not present

## 2018-04-15 DIAGNOSIS — D509 Iron deficiency anemia, unspecified: Secondary | ICD-10-CM | POA: Diagnosis not present

## 2018-04-15 DIAGNOSIS — N186 End stage renal disease: Secondary | ICD-10-CM | POA: Diagnosis not present

## 2018-04-15 DIAGNOSIS — D631 Anemia in chronic kidney disease: Secondary | ICD-10-CM | POA: Diagnosis not present

## 2018-04-15 DIAGNOSIS — Z992 Dependence on renal dialysis: Secondary | ICD-10-CM | POA: Diagnosis not present

## 2018-04-16 DIAGNOSIS — G479 Sleep disorder, unspecified: Secondary | ICD-10-CM | POA: Insufficient documentation

## 2018-04-16 DIAGNOSIS — F419 Anxiety disorder, unspecified: Secondary | ICD-10-CM | POA: Insufficient documentation

## 2018-04-16 DIAGNOSIS — R600 Localized edema: Secondary | ICD-10-CM | POA: Diagnosis not present

## 2018-04-16 DIAGNOSIS — I770 Arteriovenous fistula, acquired: Secondary | ICD-10-CM | POA: Diagnosis not present

## 2018-04-16 DIAGNOSIS — I10 Essential (primary) hypertension: Secondary | ICD-10-CM | POA: Diagnosis not present

## 2018-04-16 DIAGNOSIS — F329 Major depressive disorder, single episode, unspecified: Secondary | ICD-10-CM | POA: Diagnosis not present

## 2018-04-16 DIAGNOSIS — Z992 Dependence on renal dialysis: Secondary | ICD-10-CM | POA: Diagnosis not present

## 2018-04-16 DIAGNOSIS — K219 Gastro-esophageal reflux disease without esophagitis: Secondary | ICD-10-CM | POA: Diagnosis not present

## 2018-04-16 DIAGNOSIS — L930 Discoid lupus erythematosus: Secondary | ICD-10-CM | POA: Diagnosis not present

## 2018-04-16 DIAGNOSIS — N186 End stage renal disease: Secondary | ICD-10-CM | POA: Diagnosis not present

## 2018-04-16 DIAGNOSIS — H6121 Impacted cerumen, right ear: Secondary | ICD-10-CM | POA: Diagnosis not present

## 2018-04-17 DIAGNOSIS — Z992 Dependence on renal dialysis: Secondary | ICD-10-CM | POA: Diagnosis not present

## 2018-04-17 DIAGNOSIS — D509 Iron deficiency anemia, unspecified: Secondary | ICD-10-CM | POA: Diagnosis not present

## 2018-04-17 DIAGNOSIS — N186 End stage renal disease: Secondary | ICD-10-CM | POA: Diagnosis not present

## 2018-04-17 DIAGNOSIS — D631 Anemia in chronic kidney disease: Secondary | ICD-10-CM | POA: Diagnosis not present

## 2018-04-18 DIAGNOSIS — Z992 Dependence on renal dialysis: Secondary | ICD-10-CM | POA: Diagnosis not present

## 2018-04-18 DIAGNOSIS — N186 End stage renal disease: Secondary | ICD-10-CM | POA: Diagnosis not present

## 2018-04-19 DIAGNOSIS — D509 Iron deficiency anemia, unspecified: Secondary | ICD-10-CM | POA: Diagnosis not present

## 2018-04-19 DIAGNOSIS — N2581 Secondary hyperparathyroidism of renal origin: Secondary | ICD-10-CM | POA: Diagnosis not present

## 2018-04-19 DIAGNOSIS — N186 End stage renal disease: Secondary | ICD-10-CM | POA: Diagnosis not present

## 2018-04-19 DIAGNOSIS — D631 Anemia in chronic kidney disease: Secondary | ICD-10-CM | POA: Diagnosis not present

## 2018-04-19 DIAGNOSIS — Z992 Dependence on renal dialysis: Secondary | ICD-10-CM | POA: Diagnosis not present

## 2018-04-22 DIAGNOSIS — D631 Anemia in chronic kidney disease: Secondary | ICD-10-CM | POA: Diagnosis not present

## 2018-04-22 DIAGNOSIS — D509 Iron deficiency anemia, unspecified: Secondary | ICD-10-CM | POA: Diagnosis not present

## 2018-04-22 DIAGNOSIS — Z992 Dependence on renal dialysis: Secondary | ICD-10-CM | POA: Diagnosis not present

## 2018-04-22 DIAGNOSIS — N2581 Secondary hyperparathyroidism of renal origin: Secondary | ICD-10-CM | POA: Diagnosis not present

## 2018-04-22 DIAGNOSIS — N186 End stage renal disease: Secondary | ICD-10-CM | POA: Diagnosis not present

## 2018-04-24 DIAGNOSIS — D631 Anemia in chronic kidney disease: Secondary | ICD-10-CM | POA: Diagnosis not present

## 2018-04-24 DIAGNOSIS — N2581 Secondary hyperparathyroidism of renal origin: Secondary | ICD-10-CM | POA: Diagnosis not present

## 2018-04-24 DIAGNOSIS — N186 End stage renal disease: Secondary | ICD-10-CM | POA: Diagnosis not present

## 2018-04-24 DIAGNOSIS — Z992 Dependence on renal dialysis: Secondary | ICD-10-CM | POA: Diagnosis not present

## 2018-04-24 DIAGNOSIS — D509 Iron deficiency anemia, unspecified: Secondary | ICD-10-CM | POA: Diagnosis not present

## 2018-04-26 DIAGNOSIS — N2581 Secondary hyperparathyroidism of renal origin: Secondary | ICD-10-CM | POA: Diagnosis not present

## 2018-04-26 DIAGNOSIS — D509 Iron deficiency anemia, unspecified: Secondary | ICD-10-CM | POA: Diagnosis not present

## 2018-04-26 DIAGNOSIS — D631 Anemia in chronic kidney disease: Secondary | ICD-10-CM | POA: Diagnosis not present

## 2018-04-26 DIAGNOSIS — N186 End stage renal disease: Secondary | ICD-10-CM | POA: Diagnosis not present

## 2018-04-26 DIAGNOSIS — Z992 Dependence on renal dialysis: Secondary | ICD-10-CM | POA: Diagnosis not present

## 2018-04-29 DIAGNOSIS — D631 Anemia in chronic kidney disease: Secondary | ICD-10-CM | POA: Diagnosis not present

## 2018-04-29 DIAGNOSIS — N186 End stage renal disease: Secondary | ICD-10-CM | POA: Diagnosis not present

## 2018-04-29 DIAGNOSIS — Z992 Dependence on renal dialysis: Secondary | ICD-10-CM | POA: Diagnosis not present

## 2018-04-29 DIAGNOSIS — N2581 Secondary hyperparathyroidism of renal origin: Secondary | ICD-10-CM | POA: Diagnosis not present

## 2018-04-29 DIAGNOSIS — D509 Iron deficiency anemia, unspecified: Secondary | ICD-10-CM | POA: Diagnosis not present

## 2018-05-01 DIAGNOSIS — N2581 Secondary hyperparathyroidism of renal origin: Secondary | ICD-10-CM | POA: Diagnosis not present

## 2018-05-01 DIAGNOSIS — D631 Anemia in chronic kidney disease: Secondary | ICD-10-CM | POA: Diagnosis not present

## 2018-05-01 DIAGNOSIS — N186 End stage renal disease: Secondary | ICD-10-CM | POA: Diagnosis not present

## 2018-05-01 DIAGNOSIS — D509 Iron deficiency anemia, unspecified: Secondary | ICD-10-CM | POA: Diagnosis not present

## 2018-05-01 DIAGNOSIS — Z992 Dependence on renal dialysis: Secondary | ICD-10-CM | POA: Diagnosis not present

## 2018-05-02 DIAGNOSIS — D509 Iron deficiency anemia, unspecified: Secondary | ICD-10-CM | POA: Diagnosis not present

## 2018-05-02 DIAGNOSIS — N186 End stage renal disease: Secondary | ICD-10-CM | POA: Diagnosis not present

## 2018-05-02 DIAGNOSIS — Z992 Dependence on renal dialysis: Secondary | ICD-10-CM | POA: Diagnosis not present

## 2018-05-02 DIAGNOSIS — N2581 Secondary hyperparathyroidism of renal origin: Secondary | ICD-10-CM | POA: Diagnosis not present

## 2018-05-02 DIAGNOSIS — D631 Anemia in chronic kidney disease: Secondary | ICD-10-CM | POA: Diagnosis not present

## 2018-05-06 DIAGNOSIS — N2581 Secondary hyperparathyroidism of renal origin: Secondary | ICD-10-CM | POA: Diagnosis not present

## 2018-05-06 DIAGNOSIS — D631 Anemia in chronic kidney disease: Secondary | ICD-10-CM | POA: Diagnosis not present

## 2018-05-06 DIAGNOSIS — N186 End stage renal disease: Secondary | ICD-10-CM | POA: Diagnosis not present

## 2018-05-06 DIAGNOSIS — Z992 Dependence on renal dialysis: Secondary | ICD-10-CM | POA: Diagnosis not present

## 2018-05-06 DIAGNOSIS — D509 Iron deficiency anemia, unspecified: Secondary | ICD-10-CM | POA: Diagnosis not present

## 2018-05-08 DIAGNOSIS — N186 End stage renal disease: Secondary | ICD-10-CM | POA: Diagnosis not present

## 2018-05-08 DIAGNOSIS — D509 Iron deficiency anemia, unspecified: Secondary | ICD-10-CM | POA: Diagnosis not present

## 2018-05-08 DIAGNOSIS — N2581 Secondary hyperparathyroidism of renal origin: Secondary | ICD-10-CM | POA: Diagnosis not present

## 2018-05-08 DIAGNOSIS — D631 Anemia in chronic kidney disease: Secondary | ICD-10-CM | POA: Diagnosis not present

## 2018-05-08 DIAGNOSIS — Z992 Dependence on renal dialysis: Secondary | ICD-10-CM | POA: Diagnosis not present

## 2018-05-10 DIAGNOSIS — N186 End stage renal disease: Secondary | ICD-10-CM | POA: Diagnosis not present

## 2018-05-10 DIAGNOSIS — Z992 Dependence on renal dialysis: Secondary | ICD-10-CM | POA: Diagnosis not present

## 2018-05-10 DIAGNOSIS — N2581 Secondary hyperparathyroidism of renal origin: Secondary | ICD-10-CM | POA: Diagnosis not present

## 2018-05-10 DIAGNOSIS — D631 Anemia in chronic kidney disease: Secondary | ICD-10-CM | POA: Diagnosis not present

## 2018-05-10 DIAGNOSIS — D509 Iron deficiency anemia, unspecified: Secondary | ICD-10-CM | POA: Diagnosis not present

## 2018-05-13 DIAGNOSIS — D631 Anemia in chronic kidney disease: Secondary | ICD-10-CM | POA: Diagnosis not present

## 2018-05-13 DIAGNOSIS — Z992 Dependence on renal dialysis: Secondary | ICD-10-CM | POA: Diagnosis not present

## 2018-05-13 DIAGNOSIS — N186 End stage renal disease: Secondary | ICD-10-CM | POA: Diagnosis not present

## 2018-05-13 DIAGNOSIS — N2581 Secondary hyperparathyroidism of renal origin: Secondary | ICD-10-CM | POA: Diagnosis not present

## 2018-05-13 DIAGNOSIS — D509 Iron deficiency anemia, unspecified: Secondary | ICD-10-CM | POA: Diagnosis not present

## 2018-05-15 DIAGNOSIS — D509 Iron deficiency anemia, unspecified: Secondary | ICD-10-CM | POA: Diagnosis not present

## 2018-05-15 DIAGNOSIS — N2581 Secondary hyperparathyroidism of renal origin: Secondary | ICD-10-CM | POA: Diagnosis not present

## 2018-05-15 DIAGNOSIS — N186 End stage renal disease: Secondary | ICD-10-CM | POA: Diagnosis not present

## 2018-05-15 DIAGNOSIS — D631 Anemia in chronic kidney disease: Secondary | ICD-10-CM | POA: Diagnosis not present

## 2018-05-15 DIAGNOSIS — Z992 Dependence on renal dialysis: Secondary | ICD-10-CM | POA: Diagnosis not present

## 2018-05-17 DIAGNOSIS — N186 End stage renal disease: Secondary | ICD-10-CM | POA: Diagnosis not present

## 2018-05-17 DIAGNOSIS — D631 Anemia in chronic kidney disease: Secondary | ICD-10-CM | POA: Diagnosis not present

## 2018-05-17 DIAGNOSIS — D509 Iron deficiency anemia, unspecified: Secondary | ICD-10-CM | POA: Diagnosis not present

## 2018-05-17 DIAGNOSIS — N2581 Secondary hyperparathyroidism of renal origin: Secondary | ICD-10-CM | POA: Diagnosis not present

## 2018-05-17 DIAGNOSIS — Z992 Dependence on renal dialysis: Secondary | ICD-10-CM | POA: Diagnosis not present

## 2018-05-18 DIAGNOSIS — Z992 Dependence on renal dialysis: Secondary | ICD-10-CM | POA: Diagnosis not present

## 2018-05-18 DIAGNOSIS — N186 End stage renal disease: Secondary | ICD-10-CM | POA: Diagnosis not present

## 2018-05-20 DIAGNOSIS — D509 Iron deficiency anemia, unspecified: Secondary | ICD-10-CM | POA: Diagnosis not present

## 2018-05-20 DIAGNOSIS — D631 Anemia in chronic kidney disease: Secondary | ICD-10-CM | POA: Diagnosis not present

## 2018-05-20 DIAGNOSIS — Z992 Dependence on renal dialysis: Secondary | ICD-10-CM | POA: Diagnosis not present

## 2018-05-20 DIAGNOSIS — N186 End stage renal disease: Secondary | ICD-10-CM | POA: Diagnosis not present

## 2018-05-22 DIAGNOSIS — D509 Iron deficiency anemia, unspecified: Secondary | ICD-10-CM | POA: Diagnosis not present

## 2018-05-22 DIAGNOSIS — Z992 Dependence on renal dialysis: Secondary | ICD-10-CM | POA: Diagnosis not present

## 2018-05-22 DIAGNOSIS — D631 Anemia in chronic kidney disease: Secondary | ICD-10-CM | POA: Diagnosis not present

## 2018-05-22 DIAGNOSIS — N186 End stage renal disease: Secondary | ICD-10-CM | POA: Diagnosis not present

## 2018-05-24 DIAGNOSIS — Z992 Dependence on renal dialysis: Secondary | ICD-10-CM | POA: Diagnosis not present

## 2018-05-24 DIAGNOSIS — D509 Iron deficiency anemia, unspecified: Secondary | ICD-10-CM | POA: Diagnosis not present

## 2018-05-24 DIAGNOSIS — N186 End stage renal disease: Secondary | ICD-10-CM | POA: Diagnosis not present

## 2018-05-24 DIAGNOSIS — D631 Anemia in chronic kidney disease: Secondary | ICD-10-CM | POA: Diagnosis not present

## 2018-05-27 DIAGNOSIS — N186 End stage renal disease: Secondary | ICD-10-CM | POA: Diagnosis not present

## 2018-05-27 DIAGNOSIS — D631 Anemia in chronic kidney disease: Secondary | ICD-10-CM | POA: Diagnosis not present

## 2018-05-27 DIAGNOSIS — D509 Iron deficiency anemia, unspecified: Secondary | ICD-10-CM | POA: Diagnosis not present

## 2018-05-27 DIAGNOSIS — Z992 Dependence on renal dialysis: Secondary | ICD-10-CM | POA: Diagnosis not present

## 2018-05-29 DIAGNOSIS — N186 End stage renal disease: Secondary | ICD-10-CM | POA: Diagnosis not present

## 2018-05-29 DIAGNOSIS — D631 Anemia in chronic kidney disease: Secondary | ICD-10-CM | POA: Diagnosis not present

## 2018-05-29 DIAGNOSIS — Z992 Dependence on renal dialysis: Secondary | ICD-10-CM | POA: Diagnosis not present

## 2018-05-29 DIAGNOSIS — D509 Iron deficiency anemia, unspecified: Secondary | ICD-10-CM | POA: Diagnosis not present

## 2018-05-31 DIAGNOSIS — N186 End stage renal disease: Secondary | ICD-10-CM | POA: Diagnosis not present

## 2018-05-31 DIAGNOSIS — D631 Anemia in chronic kidney disease: Secondary | ICD-10-CM | POA: Diagnosis not present

## 2018-05-31 DIAGNOSIS — D509 Iron deficiency anemia, unspecified: Secondary | ICD-10-CM | POA: Diagnosis not present

## 2018-05-31 DIAGNOSIS — Z992 Dependence on renal dialysis: Secondary | ICD-10-CM | POA: Diagnosis not present

## 2018-06-03 DIAGNOSIS — D509 Iron deficiency anemia, unspecified: Secondary | ICD-10-CM | POA: Diagnosis not present

## 2018-06-03 DIAGNOSIS — D631 Anemia in chronic kidney disease: Secondary | ICD-10-CM | POA: Diagnosis not present

## 2018-06-03 DIAGNOSIS — Z992 Dependence on renal dialysis: Secondary | ICD-10-CM | POA: Diagnosis not present

## 2018-06-03 DIAGNOSIS — N186 End stage renal disease: Secondary | ICD-10-CM | POA: Diagnosis not present

## 2018-06-05 DIAGNOSIS — D631 Anemia in chronic kidney disease: Secondary | ICD-10-CM | POA: Diagnosis not present

## 2018-06-05 DIAGNOSIS — D509 Iron deficiency anemia, unspecified: Secondary | ICD-10-CM | POA: Diagnosis not present

## 2018-06-05 DIAGNOSIS — Z992 Dependence on renal dialysis: Secondary | ICD-10-CM | POA: Diagnosis not present

## 2018-06-05 DIAGNOSIS — N186 End stage renal disease: Secondary | ICD-10-CM | POA: Diagnosis not present

## 2018-06-07 DIAGNOSIS — D631 Anemia in chronic kidney disease: Secondary | ICD-10-CM | POA: Diagnosis not present

## 2018-06-07 DIAGNOSIS — D509 Iron deficiency anemia, unspecified: Secondary | ICD-10-CM | POA: Diagnosis not present

## 2018-06-07 DIAGNOSIS — Z992 Dependence on renal dialysis: Secondary | ICD-10-CM | POA: Diagnosis not present

## 2018-06-07 DIAGNOSIS — N186 End stage renal disease: Secondary | ICD-10-CM | POA: Diagnosis not present

## 2018-06-10 DIAGNOSIS — D631 Anemia in chronic kidney disease: Secondary | ICD-10-CM | POA: Diagnosis not present

## 2018-06-10 DIAGNOSIS — Z992 Dependence on renal dialysis: Secondary | ICD-10-CM | POA: Diagnosis not present

## 2018-06-10 DIAGNOSIS — D509 Iron deficiency anemia, unspecified: Secondary | ICD-10-CM | POA: Diagnosis not present

## 2018-06-10 DIAGNOSIS — N186 End stage renal disease: Secondary | ICD-10-CM | POA: Diagnosis not present

## 2018-06-12 DIAGNOSIS — D631 Anemia in chronic kidney disease: Secondary | ICD-10-CM | POA: Diagnosis not present

## 2018-06-12 DIAGNOSIS — Z992 Dependence on renal dialysis: Secondary | ICD-10-CM | POA: Diagnosis not present

## 2018-06-12 DIAGNOSIS — D509 Iron deficiency anemia, unspecified: Secondary | ICD-10-CM | POA: Diagnosis not present

## 2018-06-12 DIAGNOSIS — N186 End stage renal disease: Secondary | ICD-10-CM | POA: Diagnosis not present

## 2018-06-14 DIAGNOSIS — D631 Anemia in chronic kidney disease: Secondary | ICD-10-CM | POA: Diagnosis not present

## 2018-06-14 DIAGNOSIS — Z992 Dependence on renal dialysis: Secondary | ICD-10-CM | POA: Diagnosis not present

## 2018-06-14 DIAGNOSIS — D509 Iron deficiency anemia, unspecified: Secondary | ICD-10-CM | POA: Diagnosis not present

## 2018-06-14 DIAGNOSIS — N186 End stage renal disease: Secondary | ICD-10-CM | POA: Diagnosis not present

## 2018-06-17 DIAGNOSIS — Z992 Dependence on renal dialysis: Secondary | ICD-10-CM | POA: Diagnosis not present

## 2018-06-17 DIAGNOSIS — D631 Anemia in chronic kidney disease: Secondary | ICD-10-CM | POA: Diagnosis not present

## 2018-06-17 DIAGNOSIS — D509 Iron deficiency anemia, unspecified: Secondary | ICD-10-CM | POA: Diagnosis not present

## 2018-06-17 DIAGNOSIS — N186 End stage renal disease: Secondary | ICD-10-CM | POA: Diagnosis not present

## 2018-06-18 DIAGNOSIS — Z992 Dependence on renal dialysis: Secondary | ICD-10-CM | POA: Diagnosis not present

## 2018-06-18 DIAGNOSIS — N186 End stage renal disease: Secondary | ICD-10-CM | POA: Diagnosis not present

## 2018-06-19 DIAGNOSIS — D509 Iron deficiency anemia, unspecified: Secondary | ICD-10-CM | POA: Diagnosis not present

## 2018-06-19 DIAGNOSIS — D631 Anemia in chronic kidney disease: Secondary | ICD-10-CM | POA: Diagnosis not present

## 2018-06-19 DIAGNOSIS — N186 End stage renal disease: Secondary | ICD-10-CM | POA: Diagnosis not present

## 2018-06-19 DIAGNOSIS — Z992 Dependence on renal dialysis: Secondary | ICD-10-CM | POA: Diagnosis not present

## 2018-06-21 DIAGNOSIS — Z992 Dependence on renal dialysis: Secondary | ICD-10-CM | POA: Diagnosis not present

## 2018-06-21 DIAGNOSIS — N186 End stage renal disease: Secondary | ICD-10-CM | POA: Diagnosis not present

## 2018-06-21 DIAGNOSIS — D509 Iron deficiency anemia, unspecified: Secondary | ICD-10-CM | POA: Diagnosis not present

## 2018-06-21 DIAGNOSIS — D631 Anemia in chronic kidney disease: Secondary | ICD-10-CM | POA: Diagnosis not present

## 2018-06-24 DIAGNOSIS — N186 End stage renal disease: Secondary | ICD-10-CM | POA: Diagnosis not present

## 2018-06-24 DIAGNOSIS — D631 Anemia in chronic kidney disease: Secondary | ICD-10-CM | POA: Diagnosis not present

## 2018-06-24 DIAGNOSIS — D509 Iron deficiency anemia, unspecified: Secondary | ICD-10-CM | POA: Diagnosis not present

## 2018-06-24 DIAGNOSIS — Z992 Dependence on renal dialysis: Secondary | ICD-10-CM | POA: Diagnosis not present

## 2018-06-26 DIAGNOSIS — D509 Iron deficiency anemia, unspecified: Secondary | ICD-10-CM | POA: Diagnosis not present

## 2018-06-26 DIAGNOSIS — N186 End stage renal disease: Secondary | ICD-10-CM | POA: Diagnosis not present

## 2018-06-26 DIAGNOSIS — Z992 Dependence on renal dialysis: Secondary | ICD-10-CM | POA: Diagnosis not present

## 2018-06-26 DIAGNOSIS — D631 Anemia in chronic kidney disease: Secondary | ICD-10-CM | POA: Diagnosis not present

## 2018-06-28 DIAGNOSIS — D631 Anemia in chronic kidney disease: Secondary | ICD-10-CM | POA: Diagnosis not present

## 2018-06-28 DIAGNOSIS — N186 End stage renal disease: Secondary | ICD-10-CM | POA: Diagnosis not present

## 2018-06-28 DIAGNOSIS — Z992 Dependence on renal dialysis: Secondary | ICD-10-CM | POA: Diagnosis not present

## 2018-06-28 DIAGNOSIS — D509 Iron deficiency anemia, unspecified: Secondary | ICD-10-CM | POA: Diagnosis not present

## 2018-07-01 DIAGNOSIS — D509 Iron deficiency anemia, unspecified: Secondary | ICD-10-CM | POA: Diagnosis not present

## 2018-07-01 DIAGNOSIS — Z992 Dependence on renal dialysis: Secondary | ICD-10-CM | POA: Diagnosis not present

## 2018-07-01 DIAGNOSIS — N186 End stage renal disease: Secondary | ICD-10-CM | POA: Diagnosis not present

## 2018-07-01 DIAGNOSIS — D631 Anemia in chronic kidney disease: Secondary | ICD-10-CM | POA: Diagnosis not present

## 2018-07-03 DIAGNOSIS — D631 Anemia in chronic kidney disease: Secondary | ICD-10-CM | POA: Diagnosis not present

## 2018-07-03 DIAGNOSIS — D509 Iron deficiency anemia, unspecified: Secondary | ICD-10-CM | POA: Diagnosis not present

## 2018-07-03 DIAGNOSIS — N186 End stage renal disease: Secondary | ICD-10-CM | POA: Diagnosis not present

## 2018-07-03 DIAGNOSIS — Z992 Dependence on renal dialysis: Secondary | ICD-10-CM | POA: Diagnosis not present

## 2018-07-05 DIAGNOSIS — Z992 Dependence on renal dialysis: Secondary | ICD-10-CM | POA: Diagnosis not present

## 2018-07-05 DIAGNOSIS — D509 Iron deficiency anemia, unspecified: Secondary | ICD-10-CM | POA: Diagnosis not present

## 2018-07-05 DIAGNOSIS — N186 End stage renal disease: Secondary | ICD-10-CM | POA: Diagnosis not present

## 2018-07-05 DIAGNOSIS — D631 Anemia in chronic kidney disease: Secondary | ICD-10-CM | POA: Diagnosis not present

## 2018-07-08 DIAGNOSIS — D631 Anemia in chronic kidney disease: Secondary | ICD-10-CM | POA: Diagnosis not present

## 2018-07-08 DIAGNOSIS — N186 End stage renal disease: Secondary | ICD-10-CM | POA: Diagnosis not present

## 2018-07-08 DIAGNOSIS — D509 Iron deficiency anemia, unspecified: Secondary | ICD-10-CM | POA: Diagnosis not present

## 2018-07-08 DIAGNOSIS — Z992 Dependence on renal dialysis: Secondary | ICD-10-CM | POA: Diagnosis not present

## 2018-07-10 DIAGNOSIS — Z992 Dependence on renal dialysis: Secondary | ICD-10-CM | POA: Diagnosis not present

## 2018-07-10 DIAGNOSIS — D509 Iron deficiency anemia, unspecified: Secondary | ICD-10-CM | POA: Diagnosis not present

## 2018-07-10 DIAGNOSIS — D631 Anemia in chronic kidney disease: Secondary | ICD-10-CM | POA: Diagnosis not present

## 2018-07-10 DIAGNOSIS — N186 End stage renal disease: Secondary | ICD-10-CM | POA: Diagnosis not present

## 2018-07-12 DIAGNOSIS — D509 Iron deficiency anemia, unspecified: Secondary | ICD-10-CM | POA: Diagnosis not present

## 2018-07-12 DIAGNOSIS — N186 End stage renal disease: Secondary | ICD-10-CM | POA: Diagnosis not present

## 2018-07-12 DIAGNOSIS — D631 Anemia in chronic kidney disease: Secondary | ICD-10-CM | POA: Diagnosis not present

## 2018-07-12 DIAGNOSIS — Z992 Dependence on renal dialysis: Secondary | ICD-10-CM | POA: Diagnosis not present

## 2018-07-15 DIAGNOSIS — D631 Anemia in chronic kidney disease: Secondary | ICD-10-CM | POA: Diagnosis not present

## 2018-07-15 DIAGNOSIS — Z992 Dependence on renal dialysis: Secondary | ICD-10-CM | POA: Diagnosis not present

## 2018-07-15 DIAGNOSIS — N186 End stage renal disease: Secondary | ICD-10-CM | POA: Diagnosis not present

## 2018-07-15 DIAGNOSIS — D509 Iron deficiency anemia, unspecified: Secondary | ICD-10-CM | POA: Diagnosis not present

## 2018-07-17 DIAGNOSIS — D509 Iron deficiency anemia, unspecified: Secondary | ICD-10-CM | POA: Diagnosis not present

## 2018-07-17 DIAGNOSIS — D631 Anemia in chronic kidney disease: Secondary | ICD-10-CM | POA: Diagnosis not present

## 2018-07-17 DIAGNOSIS — Z992 Dependence on renal dialysis: Secondary | ICD-10-CM | POA: Diagnosis not present

## 2018-07-17 DIAGNOSIS — N186 End stage renal disease: Secondary | ICD-10-CM | POA: Diagnosis not present

## 2018-07-18 DIAGNOSIS — N186 End stage renal disease: Secondary | ICD-10-CM | POA: Diagnosis not present

## 2018-07-18 DIAGNOSIS — Z992 Dependence on renal dialysis: Secondary | ICD-10-CM | POA: Diagnosis not present

## 2018-07-19 DIAGNOSIS — N186 End stage renal disease: Secondary | ICD-10-CM | POA: Diagnosis not present

## 2018-07-19 DIAGNOSIS — Z992 Dependence on renal dialysis: Secondary | ICD-10-CM | POA: Diagnosis not present

## 2018-07-19 DIAGNOSIS — D631 Anemia in chronic kidney disease: Secondary | ICD-10-CM | POA: Diagnosis not present

## 2018-07-19 DIAGNOSIS — D509 Iron deficiency anemia, unspecified: Secondary | ICD-10-CM | POA: Diagnosis not present

## 2018-07-22 DIAGNOSIS — D509 Iron deficiency anemia, unspecified: Secondary | ICD-10-CM | POA: Diagnosis not present

## 2018-07-22 DIAGNOSIS — D631 Anemia in chronic kidney disease: Secondary | ICD-10-CM | POA: Diagnosis not present

## 2018-07-22 DIAGNOSIS — N186 End stage renal disease: Secondary | ICD-10-CM | POA: Diagnosis not present

## 2018-07-22 DIAGNOSIS — Z992 Dependence on renal dialysis: Secondary | ICD-10-CM | POA: Diagnosis not present

## 2018-07-22 DIAGNOSIS — N2581 Secondary hyperparathyroidism of renal origin: Secondary | ICD-10-CM | POA: Diagnosis not present

## 2018-07-24 DIAGNOSIS — N2581 Secondary hyperparathyroidism of renal origin: Secondary | ICD-10-CM | POA: Diagnosis not present

## 2018-07-24 DIAGNOSIS — N186 End stage renal disease: Secondary | ICD-10-CM | POA: Diagnosis not present

## 2018-07-24 DIAGNOSIS — D631 Anemia in chronic kidney disease: Secondary | ICD-10-CM | POA: Diagnosis not present

## 2018-07-24 DIAGNOSIS — D509 Iron deficiency anemia, unspecified: Secondary | ICD-10-CM | POA: Diagnosis not present

## 2018-07-24 DIAGNOSIS — Z992 Dependence on renal dialysis: Secondary | ICD-10-CM | POA: Diagnosis not present

## 2018-07-26 DIAGNOSIS — N186 End stage renal disease: Secondary | ICD-10-CM | POA: Diagnosis not present

## 2018-07-26 DIAGNOSIS — Z992 Dependence on renal dialysis: Secondary | ICD-10-CM | POA: Diagnosis not present

## 2018-07-26 DIAGNOSIS — N2581 Secondary hyperparathyroidism of renal origin: Secondary | ICD-10-CM | POA: Diagnosis not present

## 2018-07-26 DIAGNOSIS — D631 Anemia in chronic kidney disease: Secondary | ICD-10-CM | POA: Diagnosis not present

## 2018-07-26 DIAGNOSIS — D509 Iron deficiency anemia, unspecified: Secondary | ICD-10-CM | POA: Diagnosis not present

## 2018-07-29 DIAGNOSIS — Z992 Dependence on renal dialysis: Secondary | ICD-10-CM | POA: Diagnosis not present

## 2018-07-29 DIAGNOSIS — N2581 Secondary hyperparathyroidism of renal origin: Secondary | ICD-10-CM | POA: Diagnosis not present

## 2018-07-29 DIAGNOSIS — N186 End stage renal disease: Secondary | ICD-10-CM | POA: Diagnosis not present

## 2018-07-29 DIAGNOSIS — D631 Anemia in chronic kidney disease: Secondary | ICD-10-CM | POA: Diagnosis not present

## 2018-07-29 DIAGNOSIS — D509 Iron deficiency anemia, unspecified: Secondary | ICD-10-CM | POA: Diagnosis not present

## 2018-07-31 DIAGNOSIS — D509 Iron deficiency anemia, unspecified: Secondary | ICD-10-CM | POA: Diagnosis not present

## 2018-07-31 DIAGNOSIS — N186 End stage renal disease: Secondary | ICD-10-CM | POA: Diagnosis not present

## 2018-07-31 DIAGNOSIS — D631 Anemia in chronic kidney disease: Secondary | ICD-10-CM | POA: Diagnosis not present

## 2018-07-31 DIAGNOSIS — Z992 Dependence on renal dialysis: Secondary | ICD-10-CM | POA: Diagnosis not present

## 2018-07-31 DIAGNOSIS — N2581 Secondary hyperparathyroidism of renal origin: Secondary | ICD-10-CM | POA: Diagnosis not present

## 2018-08-01 DIAGNOSIS — D509 Iron deficiency anemia, unspecified: Secondary | ICD-10-CM | POA: Diagnosis not present

## 2018-08-01 DIAGNOSIS — Z992 Dependence on renal dialysis: Secondary | ICD-10-CM | POA: Diagnosis not present

## 2018-08-01 DIAGNOSIS — N186 End stage renal disease: Secondary | ICD-10-CM | POA: Diagnosis not present

## 2018-08-01 DIAGNOSIS — D631 Anemia in chronic kidney disease: Secondary | ICD-10-CM | POA: Diagnosis not present

## 2018-08-01 DIAGNOSIS — N2581 Secondary hyperparathyroidism of renal origin: Secondary | ICD-10-CM | POA: Diagnosis not present

## 2018-08-04 DIAGNOSIS — N186 End stage renal disease: Secondary | ICD-10-CM | POA: Diagnosis not present

## 2018-08-04 DIAGNOSIS — N2581 Secondary hyperparathyroidism of renal origin: Secondary | ICD-10-CM | POA: Diagnosis not present

## 2018-08-04 DIAGNOSIS — D509 Iron deficiency anemia, unspecified: Secondary | ICD-10-CM | POA: Diagnosis not present

## 2018-08-04 DIAGNOSIS — Z992 Dependence on renal dialysis: Secondary | ICD-10-CM | POA: Diagnosis not present

## 2018-08-04 DIAGNOSIS — D631 Anemia in chronic kidney disease: Secondary | ICD-10-CM | POA: Diagnosis not present

## 2018-08-07 DIAGNOSIS — N2581 Secondary hyperparathyroidism of renal origin: Secondary | ICD-10-CM | POA: Diagnosis not present

## 2018-08-07 DIAGNOSIS — Z992 Dependence on renal dialysis: Secondary | ICD-10-CM | POA: Diagnosis not present

## 2018-08-07 DIAGNOSIS — D509 Iron deficiency anemia, unspecified: Secondary | ICD-10-CM | POA: Diagnosis not present

## 2018-08-07 DIAGNOSIS — N186 End stage renal disease: Secondary | ICD-10-CM | POA: Diagnosis not present

## 2018-08-07 DIAGNOSIS — D631 Anemia in chronic kidney disease: Secondary | ICD-10-CM | POA: Diagnosis not present

## 2018-08-08 DIAGNOSIS — Z992 Dependence on renal dialysis: Secondary | ICD-10-CM | POA: Diagnosis not present

## 2018-08-08 DIAGNOSIS — E8770 Fluid overload, unspecified: Secondary | ICD-10-CM | POA: Diagnosis not present

## 2018-08-08 DIAGNOSIS — N186 End stage renal disease: Secondary | ICD-10-CM | POA: Diagnosis not present

## 2018-08-09 DIAGNOSIS — N186 End stage renal disease: Secondary | ICD-10-CM | POA: Diagnosis not present

## 2018-08-09 DIAGNOSIS — D509 Iron deficiency anemia, unspecified: Secondary | ICD-10-CM | POA: Diagnosis not present

## 2018-08-09 DIAGNOSIS — D631 Anemia in chronic kidney disease: Secondary | ICD-10-CM | POA: Diagnosis not present

## 2018-08-09 DIAGNOSIS — N2581 Secondary hyperparathyroidism of renal origin: Secondary | ICD-10-CM | POA: Diagnosis not present

## 2018-08-09 DIAGNOSIS — Z992 Dependence on renal dialysis: Secondary | ICD-10-CM | POA: Diagnosis not present

## 2018-08-12 DIAGNOSIS — N2581 Secondary hyperparathyroidism of renal origin: Secondary | ICD-10-CM | POA: Diagnosis not present

## 2018-08-12 DIAGNOSIS — Z992 Dependence on renal dialysis: Secondary | ICD-10-CM | POA: Diagnosis not present

## 2018-08-12 DIAGNOSIS — N186 End stage renal disease: Secondary | ICD-10-CM | POA: Diagnosis not present

## 2018-08-12 DIAGNOSIS — D631 Anemia in chronic kidney disease: Secondary | ICD-10-CM | POA: Diagnosis not present

## 2018-08-12 DIAGNOSIS — D509 Iron deficiency anemia, unspecified: Secondary | ICD-10-CM | POA: Diagnosis not present

## 2018-08-14 DIAGNOSIS — Z992 Dependence on renal dialysis: Secondary | ICD-10-CM | POA: Diagnosis not present

## 2018-08-14 DIAGNOSIS — D509 Iron deficiency anemia, unspecified: Secondary | ICD-10-CM | POA: Diagnosis not present

## 2018-08-14 DIAGNOSIS — D631 Anemia in chronic kidney disease: Secondary | ICD-10-CM | POA: Diagnosis not present

## 2018-08-14 DIAGNOSIS — N2581 Secondary hyperparathyroidism of renal origin: Secondary | ICD-10-CM | POA: Diagnosis not present

## 2018-08-14 DIAGNOSIS — N186 End stage renal disease: Secondary | ICD-10-CM | POA: Diagnosis not present

## 2018-08-15 ENCOUNTER — Ambulatory Visit: Payer: Self-pay

## 2018-08-15 ENCOUNTER — Telehealth: Payer: Self-pay | Admitting: *Deleted

## 2018-08-15 NOTE — Telephone Encounter (Signed)
Called and spoke with patient to inform her that nurse would not be able to do the AWV scheduled for 09/17/18 because she is no longer followed by Dr. Sharlet Salina and does not have a PCP within our facility. The patient verbalized understanding and was appreciative for the telephone call.

## 2018-08-16 DIAGNOSIS — D509 Iron deficiency anemia, unspecified: Secondary | ICD-10-CM | POA: Diagnosis not present

## 2018-08-16 DIAGNOSIS — G459 Transient cerebral ischemic attack, unspecified: Secondary | ICD-10-CM | POA: Diagnosis not present

## 2018-08-16 DIAGNOSIS — N2581 Secondary hyperparathyroidism of renal origin: Secondary | ICD-10-CM | POA: Diagnosis not present

## 2018-08-16 DIAGNOSIS — R531 Weakness: Secondary | ICD-10-CM | POA: Diagnosis not present

## 2018-08-16 DIAGNOSIS — Z992 Dependence on renal dialysis: Secondary | ICD-10-CM | POA: Diagnosis not present

## 2018-08-16 DIAGNOSIS — R0902 Hypoxemia: Secondary | ICD-10-CM | POA: Diagnosis not present

## 2018-08-16 DIAGNOSIS — R200 Anesthesia of skin: Secondary | ICD-10-CM | POA: Diagnosis not present

## 2018-08-16 DIAGNOSIS — D631 Anemia in chronic kidney disease: Secondary | ICD-10-CM | POA: Diagnosis not present

## 2018-08-16 DIAGNOSIS — Z7952 Long term (current) use of systemic steroids: Secondary | ICD-10-CM | POA: Diagnosis not present

## 2018-08-16 DIAGNOSIS — I4510 Unspecified right bundle-branch block: Secondary | ICD-10-CM | POA: Diagnosis not present

## 2018-08-16 DIAGNOSIS — Z79899 Other long term (current) drug therapy: Secondary | ICD-10-CM | POA: Diagnosis not present

## 2018-08-16 DIAGNOSIS — N186 End stage renal disease: Secondary | ICD-10-CM | POA: Diagnosis not present

## 2018-08-18 ENCOUNTER — Ambulatory Visit: Payer: Self-pay

## 2018-08-18 DIAGNOSIS — N186 End stage renal disease: Secondary | ICD-10-CM | POA: Diagnosis not present

## 2018-08-18 DIAGNOSIS — Z992 Dependence on renal dialysis: Secondary | ICD-10-CM | POA: Diagnosis not present

## 2018-08-19 DIAGNOSIS — N186 End stage renal disease: Secondary | ICD-10-CM | POA: Diagnosis not present

## 2018-08-19 DIAGNOSIS — Z23 Encounter for immunization: Secondary | ICD-10-CM | POA: Diagnosis not present

## 2018-08-19 DIAGNOSIS — D509 Iron deficiency anemia, unspecified: Secondary | ICD-10-CM | POA: Diagnosis not present

## 2018-08-19 DIAGNOSIS — Z992 Dependence on renal dialysis: Secondary | ICD-10-CM | POA: Diagnosis not present

## 2018-08-19 DIAGNOSIS — N2581 Secondary hyperparathyroidism of renal origin: Secondary | ICD-10-CM | POA: Diagnosis not present

## 2018-08-19 DIAGNOSIS — D631 Anemia in chronic kidney disease: Secondary | ICD-10-CM | POA: Diagnosis not present

## 2018-08-21 DIAGNOSIS — D631 Anemia in chronic kidney disease: Secondary | ICD-10-CM | POA: Diagnosis not present

## 2018-08-21 DIAGNOSIS — D509 Iron deficiency anemia, unspecified: Secondary | ICD-10-CM | POA: Diagnosis not present

## 2018-08-21 DIAGNOSIS — N186 End stage renal disease: Secondary | ICD-10-CM | POA: Diagnosis not present

## 2018-08-21 DIAGNOSIS — Z992 Dependence on renal dialysis: Secondary | ICD-10-CM | POA: Diagnosis not present

## 2018-08-21 DIAGNOSIS — N2581 Secondary hyperparathyroidism of renal origin: Secondary | ICD-10-CM | POA: Diagnosis not present

## 2018-08-21 DIAGNOSIS — Z23 Encounter for immunization: Secondary | ICD-10-CM | POA: Diagnosis not present

## 2018-08-22 DIAGNOSIS — Z01818 Encounter for other preprocedural examination: Secondary | ICD-10-CM | POA: Insufficient documentation

## 2018-08-23 DIAGNOSIS — D509 Iron deficiency anemia, unspecified: Secondary | ICD-10-CM | POA: Diagnosis not present

## 2018-08-23 DIAGNOSIS — Z23 Encounter for immunization: Secondary | ICD-10-CM | POA: Diagnosis not present

## 2018-08-23 DIAGNOSIS — Z992 Dependence on renal dialysis: Secondary | ICD-10-CM | POA: Diagnosis not present

## 2018-08-23 DIAGNOSIS — N2581 Secondary hyperparathyroidism of renal origin: Secondary | ICD-10-CM | POA: Diagnosis not present

## 2018-08-23 DIAGNOSIS — D631 Anemia in chronic kidney disease: Secondary | ICD-10-CM | POA: Diagnosis not present

## 2018-08-23 DIAGNOSIS — N186 End stage renal disease: Secondary | ICD-10-CM | POA: Diagnosis not present

## 2018-08-26 DIAGNOSIS — D509 Iron deficiency anemia, unspecified: Secondary | ICD-10-CM | POA: Diagnosis not present

## 2018-08-26 DIAGNOSIS — Z23 Encounter for immunization: Secondary | ICD-10-CM | POA: Diagnosis not present

## 2018-08-26 DIAGNOSIS — N2581 Secondary hyperparathyroidism of renal origin: Secondary | ICD-10-CM | POA: Diagnosis not present

## 2018-08-26 DIAGNOSIS — D631 Anemia in chronic kidney disease: Secondary | ICD-10-CM | POA: Diagnosis not present

## 2018-08-26 DIAGNOSIS — N186 End stage renal disease: Secondary | ICD-10-CM | POA: Diagnosis not present

## 2018-08-26 DIAGNOSIS — Z992 Dependence on renal dialysis: Secondary | ICD-10-CM | POA: Diagnosis not present

## 2018-08-28 DIAGNOSIS — D509 Iron deficiency anemia, unspecified: Secondary | ICD-10-CM | POA: Diagnosis not present

## 2018-08-28 DIAGNOSIS — D631 Anemia in chronic kidney disease: Secondary | ICD-10-CM | POA: Diagnosis not present

## 2018-08-28 DIAGNOSIS — N186 End stage renal disease: Secondary | ICD-10-CM | POA: Diagnosis not present

## 2018-08-28 DIAGNOSIS — Z992 Dependence on renal dialysis: Secondary | ICD-10-CM | POA: Diagnosis not present

## 2018-08-28 DIAGNOSIS — N2581 Secondary hyperparathyroidism of renal origin: Secondary | ICD-10-CM | POA: Diagnosis not present

## 2018-08-28 DIAGNOSIS — Z23 Encounter for immunization: Secondary | ICD-10-CM | POA: Diagnosis not present

## 2018-08-30 DIAGNOSIS — N2581 Secondary hyperparathyroidism of renal origin: Secondary | ICD-10-CM | POA: Diagnosis not present

## 2018-08-30 DIAGNOSIS — D509 Iron deficiency anemia, unspecified: Secondary | ICD-10-CM | POA: Diagnosis not present

## 2018-08-30 DIAGNOSIS — Z23 Encounter for immunization: Secondary | ICD-10-CM | POA: Diagnosis not present

## 2018-08-30 DIAGNOSIS — Z992 Dependence on renal dialysis: Secondary | ICD-10-CM | POA: Diagnosis not present

## 2018-08-30 DIAGNOSIS — D631 Anemia in chronic kidney disease: Secondary | ICD-10-CM | POA: Diagnosis not present

## 2018-08-30 DIAGNOSIS — N186 End stage renal disease: Secondary | ICD-10-CM | POA: Diagnosis not present

## 2018-09-02 DIAGNOSIS — Z992 Dependence on renal dialysis: Secondary | ICD-10-CM | POA: Diagnosis not present

## 2018-09-02 DIAGNOSIS — D509 Iron deficiency anemia, unspecified: Secondary | ICD-10-CM | POA: Diagnosis not present

## 2018-09-02 DIAGNOSIS — D631 Anemia in chronic kidney disease: Secondary | ICD-10-CM | POA: Diagnosis not present

## 2018-09-02 DIAGNOSIS — N186 End stage renal disease: Secondary | ICD-10-CM | POA: Diagnosis not present

## 2018-09-02 DIAGNOSIS — N2581 Secondary hyperparathyroidism of renal origin: Secondary | ICD-10-CM | POA: Diagnosis not present

## 2018-09-02 DIAGNOSIS — Z23 Encounter for immunization: Secondary | ICD-10-CM | POA: Diagnosis not present

## 2018-09-03 ENCOUNTER — Other Ambulatory Visit: Payer: Self-pay | Admitting: Internal Medicine

## 2018-09-04 DIAGNOSIS — N186 End stage renal disease: Secondary | ICD-10-CM | POA: Diagnosis not present

## 2018-09-04 DIAGNOSIS — D509 Iron deficiency anemia, unspecified: Secondary | ICD-10-CM | POA: Diagnosis not present

## 2018-09-04 DIAGNOSIS — N2581 Secondary hyperparathyroidism of renal origin: Secondary | ICD-10-CM | POA: Diagnosis not present

## 2018-09-04 DIAGNOSIS — Z23 Encounter for immunization: Secondary | ICD-10-CM | POA: Diagnosis not present

## 2018-09-04 DIAGNOSIS — Z992 Dependence on renal dialysis: Secondary | ICD-10-CM | POA: Diagnosis not present

## 2018-09-04 DIAGNOSIS — D631 Anemia in chronic kidney disease: Secondary | ICD-10-CM | POA: Diagnosis not present

## 2018-09-06 DIAGNOSIS — D509 Iron deficiency anemia, unspecified: Secondary | ICD-10-CM | POA: Diagnosis not present

## 2018-09-06 DIAGNOSIS — Z992 Dependence on renal dialysis: Secondary | ICD-10-CM | POA: Diagnosis not present

## 2018-09-06 DIAGNOSIS — N186 End stage renal disease: Secondary | ICD-10-CM | POA: Diagnosis not present

## 2018-09-06 DIAGNOSIS — D631 Anemia in chronic kidney disease: Secondary | ICD-10-CM | POA: Diagnosis not present

## 2018-09-06 DIAGNOSIS — Z23 Encounter for immunization: Secondary | ICD-10-CM | POA: Diagnosis not present

## 2018-09-06 DIAGNOSIS — N2581 Secondary hyperparathyroidism of renal origin: Secondary | ICD-10-CM | POA: Diagnosis not present

## 2018-09-09 DIAGNOSIS — Z23 Encounter for immunization: Secondary | ICD-10-CM | POA: Diagnosis not present

## 2018-09-09 DIAGNOSIS — D509 Iron deficiency anemia, unspecified: Secondary | ICD-10-CM | POA: Diagnosis not present

## 2018-09-09 DIAGNOSIS — D631 Anemia in chronic kidney disease: Secondary | ICD-10-CM | POA: Diagnosis not present

## 2018-09-09 DIAGNOSIS — N186 End stage renal disease: Secondary | ICD-10-CM | POA: Diagnosis not present

## 2018-09-09 DIAGNOSIS — N2581 Secondary hyperparathyroidism of renal origin: Secondary | ICD-10-CM | POA: Diagnosis not present

## 2018-09-09 DIAGNOSIS — Z992 Dependence on renal dialysis: Secondary | ICD-10-CM | POA: Diagnosis not present

## 2018-09-11 DIAGNOSIS — D631 Anemia in chronic kidney disease: Secondary | ICD-10-CM | POA: Diagnosis not present

## 2018-09-11 DIAGNOSIS — Z992 Dependence on renal dialysis: Secondary | ICD-10-CM | POA: Diagnosis not present

## 2018-09-11 DIAGNOSIS — D509 Iron deficiency anemia, unspecified: Secondary | ICD-10-CM | POA: Diagnosis not present

## 2018-09-11 DIAGNOSIS — Z23 Encounter for immunization: Secondary | ICD-10-CM | POA: Diagnosis not present

## 2018-09-11 DIAGNOSIS — N2581 Secondary hyperparathyroidism of renal origin: Secondary | ICD-10-CM | POA: Diagnosis not present

## 2018-09-11 DIAGNOSIS — N186 End stage renal disease: Secondary | ICD-10-CM | POA: Diagnosis not present

## 2018-09-13 DIAGNOSIS — Z992 Dependence on renal dialysis: Secondary | ICD-10-CM | POA: Diagnosis not present

## 2018-09-13 DIAGNOSIS — N186 End stage renal disease: Secondary | ICD-10-CM | POA: Diagnosis not present

## 2018-09-13 DIAGNOSIS — D509 Iron deficiency anemia, unspecified: Secondary | ICD-10-CM | POA: Diagnosis not present

## 2018-09-13 DIAGNOSIS — Z23 Encounter for immunization: Secondary | ICD-10-CM | POA: Diagnosis not present

## 2018-09-13 DIAGNOSIS — D631 Anemia in chronic kidney disease: Secondary | ICD-10-CM | POA: Diagnosis not present

## 2018-09-13 DIAGNOSIS — N2581 Secondary hyperparathyroidism of renal origin: Secondary | ICD-10-CM | POA: Diagnosis not present

## 2018-09-16 DIAGNOSIS — N186 End stage renal disease: Secondary | ICD-10-CM | POA: Diagnosis not present

## 2018-09-16 DIAGNOSIS — D631 Anemia in chronic kidney disease: Secondary | ICD-10-CM | POA: Diagnosis not present

## 2018-09-16 DIAGNOSIS — Z23 Encounter for immunization: Secondary | ICD-10-CM | POA: Diagnosis not present

## 2018-09-16 DIAGNOSIS — D509 Iron deficiency anemia, unspecified: Secondary | ICD-10-CM | POA: Diagnosis not present

## 2018-09-16 DIAGNOSIS — Z992 Dependence on renal dialysis: Secondary | ICD-10-CM | POA: Diagnosis not present

## 2018-09-16 DIAGNOSIS — N2581 Secondary hyperparathyroidism of renal origin: Secondary | ICD-10-CM | POA: Diagnosis not present

## 2018-09-18 DIAGNOSIS — D509 Iron deficiency anemia, unspecified: Secondary | ICD-10-CM | POA: Diagnosis not present

## 2018-09-18 DIAGNOSIS — N2581 Secondary hyperparathyroidism of renal origin: Secondary | ICD-10-CM | POA: Diagnosis not present

## 2018-09-18 DIAGNOSIS — Z23 Encounter for immunization: Secondary | ICD-10-CM | POA: Diagnosis not present

## 2018-09-18 DIAGNOSIS — D631 Anemia in chronic kidney disease: Secondary | ICD-10-CM | POA: Diagnosis not present

## 2018-09-18 DIAGNOSIS — N186 End stage renal disease: Secondary | ICD-10-CM | POA: Diagnosis not present

## 2018-09-18 DIAGNOSIS — Z992 Dependence on renal dialysis: Secondary | ICD-10-CM | POA: Diagnosis not present

## 2018-09-20 DIAGNOSIS — N186 End stage renal disease: Secondary | ICD-10-CM | POA: Diagnosis not present

## 2018-09-20 DIAGNOSIS — N2581 Secondary hyperparathyroidism of renal origin: Secondary | ICD-10-CM | POA: Diagnosis not present

## 2018-09-20 DIAGNOSIS — D509 Iron deficiency anemia, unspecified: Secondary | ICD-10-CM | POA: Diagnosis not present

## 2018-09-20 DIAGNOSIS — Z992 Dependence on renal dialysis: Secondary | ICD-10-CM | POA: Diagnosis not present

## 2018-09-20 DIAGNOSIS — D631 Anemia in chronic kidney disease: Secondary | ICD-10-CM | POA: Diagnosis not present

## 2018-09-23 DIAGNOSIS — D631 Anemia in chronic kidney disease: Secondary | ICD-10-CM | POA: Diagnosis not present

## 2018-09-23 DIAGNOSIS — N186 End stage renal disease: Secondary | ICD-10-CM | POA: Diagnosis not present

## 2018-09-23 DIAGNOSIS — Z992 Dependence on renal dialysis: Secondary | ICD-10-CM | POA: Diagnosis not present

## 2018-09-23 DIAGNOSIS — D509 Iron deficiency anemia, unspecified: Secondary | ICD-10-CM | POA: Diagnosis not present

## 2018-09-23 DIAGNOSIS — N2581 Secondary hyperparathyroidism of renal origin: Secondary | ICD-10-CM | POA: Diagnosis not present

## 2018-09-25 DIAGNOSIS — D631 Anemia in chronic kidney disease: Secondary | ICD-10-CM | POA: Diagnosis not present

## 2018-09-25 DIAGNOSIS — D509 Iron deficiency anemia, unspecified: Secondary | ICD-10-CM | POA: Diagnosis not present

## 2018-09-25 DIAGNOSIS — Z992 Dependence on renal dialysis: Secondary | ICD-10-CM | POA: Diagnosis not present

## 2018-09-25 DIAGNOSIS — N2581 Secondary hyperparathyroidism of renal origin: Secondary | ICD-10-CM | POA: Diagnosis not present

## 2018-09-25 DIAGNOSIS — N186 End stage renal disease: Secondary | ICD-10-CM | POA: Diagnosis not present

## 2018-09-27 DIAGNOSIS — D509 Iron deficiency anemia, unspecified: Secondary | ICD-10-CM | POA: Diagnosis not present

## 2018-09-27 DIAGNOSIS — N186 End stage renal disease: Secondary | ICD-10-CM | POA: Diagnosis not present

## 2018-09-27 DIAGNOSIS — D631 Anemia in chronic kidney disease: Secondary | ICD-10-CM | POA: Diagnosis not present

## 2018-09-27 DIAGNOSIS — Z992 Dependence on renal dialysis: Secondary | ICD-10-CM | POA: Diagnosis not present

## 2018-09-27 DIAGNOSIS — N2581 Secondary hyperparathyroidism of renal origin: Secondary | ICD-10-CM | POA: Diagnosis not present

## 2018-09-30 DIAGNOSIS — D509 Iron deficiency anemia, unspecified: Secondary | ICD-10-CM | POA: Diagnosis not present

## 2018-09-30 DIAGNOSIS — N186 End stage renal disease: Secondary | ICD-10-CM | POA: Diagnosis not present

## 2018-09-30 DIAGNOSIS — Z992 Dependence on renal dialysis: Secondary | ICD-10-CM | POA: Diagnosis not present

## 2018-09-30 DIAGNOSIS — D631 Anemia in chronic kidney disease: Secondary | ICD-10-CM | POA: Diagnosis not present

## 2018-09-30 DIAGNOSIS — N2581 Secondary hyperparathyroidism of renal origin: Secondary | ICD-10-CM | POA: Diagnosis not present

## 2018-10-02 DIAGNOSIS — D509 Iron deficiency anemia, unspecified: Secondary | ICD-10-CM | POA: Diagnosis not present

## 2018-10-02 DIAGNOSIS — Z992 Dependence on renal dialysis: Secondary | ICD-10-CM | POA: Diagnosis not present

## 2018-10-02 DIAGNOSIS — D631 Anemia in chronic kidney disease: Secondary | ICD-10-CM | POA: Diagnosis not present

## 2018-10-02 DIAGNOSIS — N2581 Secondary hyperparathyroidism of renal origin: Secondary | ICD-10-CM | POA: Diagnosis not present

## 2018-10-02 DIAGNOSIS — N186 End stage renal disease: Secondary | ICD-10-CM | POA: Diagnosis not present

## 2018-10-04 DIAGNOSIS — D631 Anemia in chronic kidney disease: Secondary | ICD-10-CM | POA: Diagnosis not present

## 2018-10-04 DIAGNOSIS — N2581 Secondary hyperparathyroidism of renal origin: Secondary | ICD-10-CM | POA: Diagnosis not present

## 2018-10-04 DIAGNOSIS — Z992 Dependence on renal dialysis: Secondary | ICD-10-CM | POA: Diagnosis not present

## 2018-10-04 DIAGNOSIS — N186 End stage renal disease: Secondary | ICD-10-CM | POA: Diagnosis not present

## 2018-10-04 DIAGNOSIS — D509 Iron deficiency anemia, unspecified: Secondary | ICD-10-CM | POA: Diagnosis not present

## 2018-10-07 DIAGNOSIS — D631 Anemia in chronic kidney disease: Secondary | ICD-10-CM | POA: Diagnosis not present

## 2018-10-07 DIAGNOSIS — N2581 Secondary hyperparathyroidism of renal origin: Secondary | ICD-10-CM | POA: Diagnosis not present

## 2018-10-07 DIAGNOSIS — N186 End stage renal disease: Secondary | ICD-10-CM | POA: Diagnosis not present

## 2018-10-07 DIAGNOSIS — D509 Iron deficiency anemia, unspecified: Secondary | ICD-10-CM | POA: Diagnosis not present

## 2018-10-07 DIAGNOSIS — Z992 Dependence on renal dialysis: Secondary | ICD-10-CM | POA: Diagnosis not present

## 2018-10-09 DIAGNOSIS — Z992 Dependence on renal dialysis: Secondary | ICD-10-CM | POA: Diagnosis not present

## 2018-10-09 DIAGNOSIS — D509 Iron deficiency anemia, unspecified: Secondary | ICD-10-CM | POA: Diagnosis not present

## 2018-10-09 DIAGNOSIS — D631 Anemia in chronic kidney disease: Secondary | ICD-10-CM | POA: Diagnosis not present

## 2018-10-09 DIAGNOSIS — N2581 Secondary hyperparathyroidism of renal origin: Secondary | ICD-10-CM | POA: Diagnosis not present

## 2018-10-09 DIAGNOSIS — N186 End stage renal disease: Secondary | ICD-10-CM | POA: Diagnosis not present

## 2018-10-11 DIAGNOSIS — N186 End stage renal disease: Secondary | ICD-10-CM | POA: Diagnosis not present

## 2018-10-11 DIAGNOSIS — D509 Iron deficiency anemia, unspecified: Secondary | ICD-10-CM | POA: Diagnosis not present

## 2018-10-11 DIAGNOSIS — Z992 Dependence on renal dialysis: Secondary | ICD-10-CM | POA: Diagnosis not present

## 2018-10-11 DIAGNOSIS — N2581 Secondary hyperparathyroidism of renal origin: Secondary | ICD-10-CM | POA: Diagnosis not present

## 2018-10-11 DIAGNOSIS — D631 Anemia in chronic kidney disease: Secondary | ICD-10-CM | POA: Diagnosis not present

## 2018-10-14 DIAGNOSIS — D509 Iron deficiency anemia, unspecified: Secondary | ICD-10-CM | POA: Diagnosis not present

## 2018-10-14 DIAGNOSIS — Z992 Dependence on renal dialysis: Secondary | ICD-10-CM | POA: Diagnosis not present

## 2018-10-14 DIAGNOSIS — N2581 Secondary hyperparathyroidism of renal origin: Secondary | ICD-10-CM | POA: Diagnosis not present

## 2018-10-14 DIAGNOSIS — D631 Anemia in chronic kidney disease: Secondary | ICD-10-CM | POA: Diagnosis not present

## 2018-10-14 DIAGNOSIS — N186 End stage renal disease: Secondary | ICD-10-CM | POA: Diagnosis not present

## 2018-10-16 DIAGNOSIS — Z992 Dependence on renal dialysis: Secondary | ICD-10-CM | POA: Diagnosis not present

## 2018-10-16 DIAGNOSIS — N2581 Secondary hyperparathyroidism of renal origin: Secondary | ICD-10-CM | POA: Diagnosis not present

## 2018-10-16 DIAGNOSIS — D509 Iron deficiency anemia, unspecified: Secondary | ICD-10-CM | POA: Diagnosis not present

## 2018-10-16 DIAGNOSIS — N186 End stage renal disease: Secondary | ICD-10-CM | POA: Diagnosis not present

## 2018-10-16 DIAGNOSIS — D631 Anemia in chronic kidney disease: Secondary | ICD-10-CM | POA: Diagnosis not present

## 2018-10-18 DIAGNOSIS — D631 Anemia in chronic kidney disease: Secondary | ICD-10-CM | POA: Diagnosis not present

## 2018-10-18 DIAGNOSIS — N2581 Secondary hyperparathyroidism of renal origin: Secondary | ICD-10-CM | POA: Diagnosis not present

## 2018-10-18 DIAGNOSIS — N186 End stage renal disease: Secondary | ICD-10-CM | POA: Diagnosis not present

## 2018-10-18 DIAGNOSIS — Z992 Dependence on renal dialysis: Secondary | ICD-10-CM | POA: Diagnosis not present

## 2018-10-18 DIAGNOSIS — D509 Iron deficiency anemia, unspecified: Secondary | ICD-10-CM | POA: Diagnosis not present

## 2018-10-21 DIAGNOSIS — D631 Anemia in chronic kidney disease: Secondary | ICD-10-CM | POA: Diagnosis not present

## 2018-10-21 DIAGNOSIS — N186 End stage renal disease: Secondary | ICD-10-CM | POA: Diagnosis not present

## 2018-10-21 DIAGNOSIS — N2581 Secondary hyperparathyroidism of renal origin: Secondary | ICD-10-CM | POA: Diagnosis not present

## 2018-10-21 DIAGNOSIS — Z992 Dependence on renal dialysis: Secondary | ICD-10-CM | POA: Diagnosis not present

## 2018-10-21 DIAGNOSIS — D509 Iron deficiency anemia, unspecified: Secondary | ICD-10-CM | POA: Diagnosis not present

## 2018-10-22 DIAGNOSIS — Z992 Dependence on renal dialysis: Secondary | ICD-10-CM | POA: Diagnosis not present

## 2018-10-22 DIAGNOSIS — Z1151 Encounter for screening for human papillomavirus (HPV): Secondary | ICD-10-CM | POA: Diagnosis not present

## 2018-10-22 DIAGNOSIS — F419 Anxiety disorder, unspecified: Secondary | ICD-10-CM | POA: Diagnosis not present

## 2018-10-22 DIAGNOSIS — R600 Localized edema: Secondary | ICD-10-CM | POA: Insufficient documentation

## 2018-10-22 DIAGNOSIS — F329 Major depressive disorder, single episode, unspecified: Secondary | ICD-10-CM | POA: Diagnosis not present

## 2018-10-22 DIAGNOSIS — H6121 Impacted cerumen, right ear: Secondary | ICD-10-CM | POA: Diagnosis not present

## 2018-10-22 DIAGNOSIS — Z124 Encounter for screening for malignant neoplasm of cervix: Secondary | ICD-10-CM | POA: Diagnosis not present

## 2018-10-22 DIAGNOSIS — I10 Essential (primary) hypertension: Secondary | ICD-10-CM | POA: Diagnosis not present

## 2018-10-22 DIAGNOSIS — Z1231 Encounter for screening mammogram for malignant neoplasm of breast: Secondary | ICD-10-CM | POA: Diagnosis not present

## 2018-10-22 DIAGNOSIS — R87612 Low grade squamous intraepithelial lesion on cytologic smear of cervix (LGSIL): Secondary | ICD-10-CM | POA: Diagnosis not present

## 2018-10-22 DIAGNOSIS — Z113 Encounter for screening for infections with a predominantly sexual mode of transmission: Secondary | ICD-10-CM | POA: Diagnosis not present

## 2018-10-22 DIAGNOSIS — N186 End stage renal disease: Secondary | ICD-10-CM | POA: Diagnosis not present

## 2018-10-23 DIAGNOSIS — Z992 Dependence on renal dialysis: Secondary | ICD-10-CM | POA: Diagnosis not present

## 2018-10-23 DIAGNOSIS — D509 Iron deficiency anemia, unspecified: Secondary | ICD-10-CM | POA: Diagnosis not present

## 2018-10-23 DIAGNOSIS — D631 Anemia in chronic kidney disease: Secondary | ICD-10-CM | POA: Diagnosis not present

## 2018-10-23 DIAGNOSIS — N2581 Secondary hyperparathyroidism of renal origin: Secondary | ICD-10-CM | POA: Diagnosis not present

## 2018-10-23 DIAGNOSIS — N186 End stage renal disease: Secondary | ICD-10-CM | POA: Diagnosis not present

## 2018-10-24 DIAGNOSIS — Z7682 Awaiting organ transplant status: Secondary | ICD-10-CM | POA: Diagnosis not present

## 2018-10-24 DIAGNOSIS — I501 Left ventricular failure: Secondary | ICD-10-CM | POA: Diagnosis not present

## 2018-10-25 DIAGNOSIS — D631 Anemia in chronic kidney disease: Secondary | ICD-10-CM | POA: Diagnosis not present

## 2018-10-25 DIAGNOSIS — Z992 Dependence on renal dialysis: Secondary | ICD-10-CM | POA: Diagnosis not present

## 2018-10-25 DIAGNOSIS — D509 Iron deficiency anemia, unspecified: Secondary | ICD-10-CM | POA: Diagnosis not present

## 2018-10-25 DIAGNOSIS — N2581 Secondary hyperparathyroidism of renal origin: Secondary | ICD-10-CM | POA: Diagnosis not present

## 2018-10-25 DIAGNOSIS — N186 End stage renal disease: Secondary | ICD-10-CM | POA: Diagnosis not present

## 2018-10-28 DIAGNOSIS — N2581 Secondary hyperparathyroidism of renal origin: Secondary | ICD-10-CM | POA: Diagnosis not present

## 2018-10-28 DIAGNOSIS — Z992 Dependence on renal dialysis: Secondary | ICD-10-CM | POA: Diagnosis not present

## 2018-10-28 DIAGNOSIS — N186 End stage renal disease: Secondary | ICD-10-CM | POA: Diagnosis not present

## 2018-10-28 DIAGNOSIS — D631 Anemia in chronic kidney disease: Secondary | ICD-10-CM | POA: Diagnosis not present

## 2018-10-28 DIAGNOSIS — D509 Iron deficiency anemia, unspecified: Secondary | ICD-10-CM | POA: Diagnosis not present

## 2018-10-30 DIAGNOSIS — N186 End stage renal disease: Secondary | ICD-10-CM | POA: Diagnosis not present

## 2018-10-30 DIAGNOSIS — Z992 Dependence on renal dialysis: Secondary | ICD-10-CM | POA: Diagnosis not present

## 2018-10-30 DIAGNOSIS — D631 Anemia in chronic kidney disease: Secondary | ICD-10-CM | POA: Diagnosis not present

## 2018-10-30 DIAGNOSIS — N2581 Secondary hyperparathyroidism of renal origin: Secondary | ICD-10-CM | POA: Diagnosis not present

## 2018-10-30 DIAGNOSIS — D509 Iron deficiency anemia, unspecified: Secondary | ICD-10-CM | POA: Diagnosis not present

## 2018-11-01 DIAGNOSIS — D631 Anemia in chronic kidney disease: Secondary | ICD-10-CM | POA: Diagnosis not present

## 2018-11-01 DIAGNOSIS — Z992 Dependence on renal dialysis: Secondary | ICD-10-CM | POA: Diagnosis not present

## 2018-11-01 DIAGNOSIS — N186 End stage renal disease: Secondary | ICD-10-CM | POA: Diagnosis not present

## 2018-11-01 DIAGNOSIS — N2581 Secondary hyperparathyroidism of renal origin: Secondary | ICD-10-CM | POA: Diagnosis not present

## 2018-11-01 DIAGNOSIS — D509 Iron deficiency anemia, unspecified: Secondary | ICD-10-CM | POA: Diagnosis not present

## 2018-11-04 DIAGNOSIS — N186 End stage renal disease: Secondary | ICD-10-CM | POA: Diagnosis not present

## 2018-11-04 DIAGNOSIS — D631 Anemia in chronic kidney disease: Secondary | ICD-10-CM | POA: Diagnosis not present

## 2018-11-04 DIAGNOSIS — D509 Iron deficiency anemia, unspecified: Secondary | ICD-10-CM | POA: Diagnosis not present

## 2018-11-04 DIAGNOSIS — Z992 Dependence on renal dialysis: Secondary | ICD-10-CM | POA: Diagnosis not present

## 2018-11-04 DIAGNOSIS — N2581 Secondary hyperparathyroidism of renal origin: Secondary | ICD-10-CM | POA: Diagnosis not present

## 2018-11-06 DIAGNOSIS — D631 Anemia in chronic kidney disease: Secondary | ICD-10-CM | POA: Diagnosis not present

## 2018-11-06 DIAGNOSIS — D509 Iron deficiency anemia, unspecified: Secondary | ICD-10-CM | POA: Diagnosis not present

## 2018-11-06 DIAGNOSIS — N186 End stage renal disease: Secondary | ICD-10-CM | POA: Diagnosis not present

## 2018-11-06 DIAGNOSIS — N2581 Secondary hyperparathyroidism of renal origin: Secondary | ICD-10-CM | POA: Diagnosis not present

## 2018-11-06 DIAGNOSIS — Z992 Dependence on renal dialysis: Secondary | ICD-10-CM | POA: Diagnosis not present

## 2018-11-08 DIAGNOSIS — N2581 Secondary hyperparathyroidism of renal origin: Secondary | ICD-10-CM | POA: Diagnosis not present

## 2018-11-08 DIAGNOSIS — N186 End stage renal disease: Secondary | ICD-10-CM | POA: Diagnosis not present

## 2018-11-08 DIAGNOSIS — D509 Iron deficiency anemia, unspecified: Secondary | ICD-10-CM | POA: Diagnosis not present

## 2018-11-08 DIAGNOSIS — Z992 Dependence on renal dialysis: Secondary | ICD-10-CM | POA: Diagnosis not present

## 2018-11-08 DIAGNOSIS — D631 Anemia in chronic kidney disease: Secondary | ICD-10-CM | POA: Diagnosis not present

## 2018-11-10 DIAGNOSIS — D631 Anemia in chronic kidney disease: Secondary | ICD-10-CM | POA: Diagnosis not present

## 2018-11-10 DIAGNOSIS — Z992 Dependence on renal dialysis: Secondary | ICD-10-CM | POA: Diagnosis not present

## 2018-11-10 DIAGNOSIS — D509 Iron deficiency anemia, unspecified: Secondary | ICD-10-CM | POA: Diagnosis not present

## 2018-11-10 DIAGNOSIS — N2581 Secondary hyperparathyroidism of renal origin: Secondary | ICD-10-CM | POA: Diagnosis not present

## 2018-11-10 DIAGNOSIS — N186 End stage renal disease: Secondary | ICD-10-CM | POA: Diagnosis not present

## 2018-11-13 DIAGNOSIS — N2581 Secondary hyperparathyroidism of renal origin: Secondary | ICD-10-CM | POA: Diagnosis not present

## 2018-11-13 DIAGNOSIS — Z992 Dependence on renal dialysis: Secondary | ICD-10-CM | POA: Diagnosis not present

## 2018-11-13 DIAGNOSIS — N186 End stage renal disease: Secondary | ICD-10-CM | POA: Diagnosis not present

## 2018-11-13 DIAGNOSIS — D631 Anemia in chronic kidney disease: Secondary | ICD-10-CM | POA: Diagnosis not present

## 2018-11-13 DIAGNOSIS — D509 Iron deficiency anemia, unspecified: Secondary | ICD-10-CM | POA: Diagnosis not present

## 2018-11-15 DIAGNOSIS — Z992 Dependence on renal dialysis: Secondary | ICD-10-CM | POA: Diagnosis not present

## 2018-11-15 DIAGNOSIS — N2581 Secondary hyperparathyroidism of renal origin: Secondary | ICD-10-CM | POA: Diagnosis not present

## 2018-11-15 DIAGNOSIS — N186 End stage renal disease: Secondary | ICD-10-CM | POA: Diagnosis not present

## 2018-11-15 DIAGNOSIS — D509 Iron deficiency anemia, unspecified: Secondary | ICD-10-CM | POA: Diagnosis not present

## 2018-11-15 DIAGNOSIS — D631 Anemia in chronic kidney disease: Secondary | ICD-10-CM | POA: Diagnosis not present

## 2018-11-18 ENCOUNTER — Ambulatory Visit: Payer: Medicare Other | Admitting: Obstetrics and Gynecology

## 2018-11-18 ENCOUNTER — Encounter: Payer: Self-pay | Admitting: Obstetrics and Gynecology

## 2018-11-18 ENCOUNTER — Encounter (INDEPENDENT_AMBULATORY_CARE_PROVIDER_SITE_OTHER): Payer: Self-pay

## 2018-11-18 ENCOUNTER — Other Ambulatory Visit: Payer: Self-pay | Admitting: Obstetrics and Gynecology

## 2018-11-18 VITALS — BP 145/96 | HR 98 | Ht 64.0 in | Wt 141.0 lb

## 2018-11-18 DIAGNOSIS — N870 Mild cervical dysplasia: Secondary | ICD-10-CM | POA: Diagnosis not present

## 2018-11-18 DIAGNOSIS — Z992 Dependence on renal dialysis: Secondary | ICD-10-CM | POA: Diagnosis not present

## 2018-11-18 DIAGNOSIS — N186 End stage renal disease: Secondary | ICD-10-CM | POA: Diagnosis not present

## 2018-11-18 DIAGNOSIS — D509 Iron deficiency anemia, unspecified: Secondary | ICD-10-CM | POA: Diagnosis not present

## 2018-11-18 DIAGNOSIS — N2581 Secondary hyperparathyroidism of renal origin: Secondary | ICD-10-CM | POA: Diagnosis not present

## 2018-11-18 DIAGNOSIS — N72 Inflammatory disease of cervix uteri: Secondary | ICD-10-CM | POA: Diagnosis not present

## 2018-11-18 DIAGNOSIS — D631 Anemia in chronic kidney disease: Secondary | ICD-10-CM | POA: Diagnosis not present

## 2018-11-18 DIAGNOSIS — R87612 Low grade squamous intraepithelial lesion on cytologic smear of cervix (LGSIL): Secondary | ICD-10-CM

## 2018-11-18 NOTE — Progress Notes (Addendum)
Patient ID: DEMITRIA HAY, female   DOB: 09/16/1976, 42 y.o.   MRN: [id number]  NOZOMI METTLER 42 y.o. No obstetric history on file. 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 Periods have been irregular, and notes that they come on every other month.  Discussed role for HPV in cervical dysplasia, need for surveillance.  Patient given informed consent, signed copy in the chart, time out was performed.  Placed in lithotomy position. Cervix viewed with speculum and colposcope after application of acetic acid.   Colposcopy adequate? Yes Normal appearing with thick cervix mucous; biopsies obtained at 12 o'clock anterior and posterior lip  ECC specimen obtained. All specimens were labelled and sent to pathology.   Colposcopy IMPRESSION: 1. No visible dysplasia  P: 1. Call with results  ecc and cx bx 2. Annual PAP  Patient was given post procedure instructions. Will follow up pathology and manage accordingly.  Routine preventative health maintenance measures emphasized.   By signing my name below, I, Samul Dada, attest that this documentation has been prepared under the direction and in the presence of Jonnie Kind, MD. Electronically Signed: [place]. 11/18/18. 3:20 PM.  I personally performed the services described in this documentation, which was SCRIBED in my presence. The recorded information has been reviewed and considered accurate. It has been edited as necessary during review. Jonnie Kind, MD

## 2018-11-20 DIAGNOSIS — D631 Anemia in chronic kidney disease: Secondary | ICD-10-CM | POA: Diagnosis not present

## 2018-11-20 DIAGNOSIS — N2581 Secondary hyperparathyroidism of renal origin: Secondary | ICD-10-CM | POA: Diagnosis not present

## 2018-11-20 DIAGNOSIS — N186 End stage renal disease: Secondary | ICD-10-CM | POA: Diagnosis not present

## 2018-11-20 DIAGNOSIS — D509 Iron deficiency anemia, unspecified: Secondary | ICD-10-CM | POA: Diagnosis not present

## 2018-11-20 DIAGNOSIS — Z992 Dependence on renal dialysis: Secondary | ICD-10-CM | POA: Diagnosis not present

## 2018-11-22 DIAGNOSIS — D631 Anemia in chronic kidney disease: Secondary | ICD-10-CM | POA: Diagnosis not present

## 2018-11-22 DIAGNOSIS — N2581 Secondary hyperparathyroidism of renal origin: Secondary | ICD-10-CM | POA: Diagnosis not present

## 2018-11-22 DIAGNOSIS — Z992 Dependence on renal dialysis: Secondary | ICD-10-CM | POA: Diagnosis not present

## 2018-11-22 DIAGNOSIS — N186 End stage renal disease: Secondary | ICD-10-CM | POA: Diagnosis not present

## 2018-11-22 DIAGNOSIS — D509 Iron deficiency anemia, unspecified: Secondary | ICD-10-CM | POA: Diagnosis not present

## 2018-11-24 ENCOUNTER — Ambulatory Visit (INDEPENDENT_AMBULATORY_CARE_PROVIDER_SITE_OTHER): Payer: Self-pay | Admitting: Physician Assistant

## 2018-11-24 ENCOUNTER — Other Ambulatory Visit: Payer: Self-pay

## 2018-11-24 VITALS — BP 158/104 | HR 80 | Temp 97.4°F | Resp 16 | Ht 64.0 in | Wt 141.0 lb

## 2018-11-24 DIAGNOSIS — Z992 Dependence on renal dialysis: Secondary | ICD-10-CM

## 2018-11-24 DIAGNOSIS — N186 End stage renal disease: Secondary | ICD-10-CM

## 2018-11-24 NOTE — Progress Notes (Signed)
POST OPERATIVE OFFICE NOTE    CC:  F/u for surgery  HPI:  [name] is a pleasant 43 y.o. female who had a left upper arm loop graft placed 6 years ago.  There have been  multiple procedures to maintain the graft.  Most recently it was worked on about 6 months ago by [facility].  She has had poor flows in her fistula and therefore was sent for evaluation.   s/p Fistulogram left AV graft and Drug-coated balloon angioplasty of venous anastomotic stenosis.  She denise pain, numbness and loss of motor in the left UE.    Allergies  Allergen Reactions  . Amlodipine Hives    Swelling  . Sulfa Antibiotics Hives and Itching  . Vancomycin Hives and Itching    Current Outpatient Medications  Medication Sig Dispense Refill  . acetaminophen (TYLENOL) 500 MG tablet Take 500 mg by mouth daily as needed for headache.     . calcium acetate (PHOSLO) 667 MG capsule Take 1,334-2,001 mg by mouth See admin instructions. Take 2001 mg with each meal and 1334 mg with each snack    . calcium acetate (PHOSLO) 667 MG capsule Take by mouth.    . diltiazem (CARDIZEM CD) 120 MG 24 hr capsule Take 240 mg by mouth at bedtime.    Marland Kitchen diltiazem (DILT-XR) 240 MG 24 hr capsule once daily    . hydrALAZINE (APRESOLINE) 25 MG tablet Take 1 tablet (25 mg total) by mouth 2 (two) times daily. 60 tablet 11  . hydrALAZINE (APRESOLINE) 25 MG tablet Take by mouth.    Marland Kitchen lisinopril (PRINIVIL,ZESTRIL) 40 MG tablet Take 40 mg by mouth at bedtime.     Marland Kitchen lisinopril (PRINIVIL,ZESTRIL) 40 MG tablet Take by mouth.    . Multiple Vitamin (MULTIVITAMIN WITH MINERALS) TABS tablet Take 1 tablet by mouth daily.    . pantoprazole (PROTONIX) 40 MG tablet Take 1 tablet (40 mg total) by mouth daily. 30 tablet 3  . pantoprazole (PROTONIX) 40 MG tablet Take by mouth.    Marland Kitchen PARoxetine (PAXIL) 20 MG tablet Take 1 tablet (20 mg total) by mouth daily. 90 tablet 3  . PARoxetine (PAXIL) 20 MG tablet Take by mouth.    . predniSONE (DELTASONE) 10 MG  tablet Take 1 tablet (10 mg total) by mouth daily with breakfast. 90 tablet 3  . predniSONE (DELTASONE) 10 MG tablet Take by mouth.    . sevelamer carbonate (RENVELA) 800 MG tablet Take by mouth.    . traZODone (DESYREL) 50 MG tablet Take 0.5-1 tablets (25-50 mg total) by mouth at bedtime. (Patient taking differently: Take 25 mg by mouth at bedtime. ) 90 tablet 3  . traZODone (DESYREL) 50 MG tablet Take by mouth.     No current facility-administered medications for this visit.      ROS:  See HPI  Physical Exam:  There were no vitals filed for this visit.  Incision:  Well healed stick site left UE AV loop Extremities:  Sensation, motor and grip 5/5   Assessment/Plan:  This is a 43 y.o. female who is s/p: Fistulogram left AV graft and Drug-coated balloon angioplasty of venous anastomotic stenosis.   The graft has been successfully accessed.  She has no symptoms of steal in the left UE.  She is now on a waiting list for a kidney transplant.  If she has any problems or concerns she will call, otherwise she will f/u as needed.     Roxy Horseman PA-C [facility] [phone]  Clinic MD:  Trula Slade

## 2018-11-25 DIAGNOSIS — N2581 Secondary hyperparathyroidism of renal origin: Secondary | ICD-10-CM | POA: Diagnosis not present

## 2018-11-25 DIAGNOSIS — Z992 Dependence on renal dialysis: Secondary | ICD-10-CM | POA: Diagnosis not present

## 2018-11-25 DIAGNOSIS — N186 End stage renal disease: Secondary | ICD-10-CM | POA: Diagnosis not present

## 2018-11-25 DIAGNOSIS — D509 Iron deficiency anemia, unspecified: Secondary | ICD-10-CM | POA: Diagnosis not present

## 2018-11-25 DIAGNOSIS — D631 Anemia in chronic kidney disease: Secondary | ICD-10-CM | POA: Diagnosis not present

## 2018-11-26 ENCOUNTER — Encounter: Payer: Self-pay | Admitting: Obstetrics and Gynecology

## 2018-11-26 DIAGNOSIS — N870 Mild cervical dysplasia: Secondary | ICD-10-CM | POA: Insufficient documentation

## 2018-11-27 DIAGNOSIS — Z992 Dependence on renal dialysis: Secondary | ICD-10-CM | POA: Diagnosis not present

## 2018-11-27 DIAGNOSIS — D509 Iron deficiency anemia, unspecified: Secondary | ICD-10-CM | POA: Diagnosis not present

## 2018-11-27 DIAGNOSIS — N2581 Secondary hyperparathyroidism of renal origin: Secondary | ICD-10-CM | POA: Diagnosis not present

## 2018-11-27 DIAGNOSIS — D631 Anemia in chronic kidney disease: Secondary | ICD-10-CM | POA: Diagnosis not present

## 2018-11-27 DIAGNOSIS — N186 End stage renal disease: Secondary | ICD-10-CM | POA: Diagnosis not present

## 2018-11-28 DIAGNOSIS — Z1231 Encounter for screening mammogram for malignant neoplasm of breast: Secondary | ICD-10-CM | POA: Diagnosis not present

## 2018-11-29 DIAGNOSIS — D509 Iron deficiency anemia, unspecified: Secondary | ICD-10-CM | POA: Diagnosis not present

## 2018-11-29 DIAGNOSIS — Z992 Dependence on renal dialysis: Secondary | ICD-10-CM | POA: Diagnosis not present

## 2018-11-29 DIAGNOSIS — N2581 Secondary hyperparathyroidism of renal origin: Secondary | ICD-10-CM | POA: Diagnosis not present

## 2018-11-29 DIAGNOSIS — N186 End stage renal disease: Secondary | ICD-10-CM | POA: Diagnosis not present

## 2018-11-29 DIAGNOSIS — D631 Anemia in chronic kidney disease: Secondary | ICD-10-CM | POA: Diagnosis not present

## 2018-12-02 DIAGNOSIS — D631 Anemia in chronic kidney disease: Secondary | ICD-10-CM | POA: Diagnosis not present

## 2018-12-02 DIAGNOSIS — N2581 Secondary hyperparathyroidism of renal origin: Secondary | ICD-10-CM | POA: Diagnosis not present

## 2018-12-02 DIAGNOSIS — Z992 Dependence on renal dialysis: Secondary | ICD-10-CM | POA: Diagnosis not present

## 2018-12-02 DIAGNOSIS — N186 End stage renal disease: Secondary | ICD-10-CM | POA: Diagnosis not present

## 2018-12-02 DIAGNOSIS — D509 Iron deficiency anemia, unspecified: Secondary | ICD-10-CM | POA: Diagnosis not present

## 2018-12-03 DIAGNOSIS — F329 Major depressive disorder, single episode, unspecified: Secondary | ICD-10-CM | POA: Diagnosis not present

## 2018-12-03 DIAGNOSIS — H6121 Impacted cerumen, right ear: Secondary | ICD-10-CM | POA: Diagnosis not present

## 2018-12-03 DIAGNOSIS — F419 Anxiety disorder, unspecified: Secondary | ICD-10-CM | POA: Diagnosis not present

## 2018-12-04 DIAGNOSIS — Z992 Dependence on renal dialysis: Secondary | ICD-10-CM | POA: Diagnosis not present

## 2018-12-04 DIAGNOSIS — N186 End stage renal disease: Secondary | ICD-10-CM | POA: Diagnosis not present

## 2018-12-04 DIAGNOSIS — D509 Iron deficiency anemia, unspecified: Secondary | ICD-10-CM | POA: Diagnosis not present

## 2018-12-04 DIAGNOSIS — N2581 Secondary hyperparathyroidism of renal origin: Secondary | ICD-10-CM | POA: Diagnosis not present

## 2018-12-04 DIAGNOSIS — D631 Anemia in chronic kidney disease: Secondary | ICD-10-CM | POA: Diagnosis not present

## 2018-12-06 DIAGNOSIS — D631 Anemia in chronic kidney disease: Secondary | ICD-10-CM | POA: Diagnosis not present

## 2018-12-06 DIAGNOSIS — D509 Iron deficiency anemia, unspecified: Secondary | ICD-10-CM | POA: Diagnosis not present

## 2018-12-06 DIAGNOSIS — N186 End stage renal disease: Secondary | ICD-10-CM | POA: Diagnosis not present

## 2018-12-06 DIAGNOSIS — Z992 Dependence on renal dialysis: Secondary | ICD-10-CM | POA: Diagnosis not present

## 2018-12-06 DIAGNOSIS — N2581 Secondary hyperparathyroidism of renal origin: Secondary | ICD-10-CM | POA: Diagnosis not present

## 2018-12-09 DIAGNOSIS — N2581 Secondary hyperparathyroidism of renal origin: Secondary | ICD-10-CM | POA: Diagnosis not present

## 2018-12-09 DIAGNOSIS — D631 Anemia in chronic kidney disease: Secondary | ICD-10-CM | POA: Diagnosis not present

## 2018-12-09 DIAGNOSIS — Z992 Dependence on renal dialysis: Secondary | ICD-10-CM | POA: Diagnosis not present

## 2018-12-09 DIAGNOSIS — N186 End stage renal disease: Secondary | ICD-10-CM | POA: Diagnosis not present

## 2018-12-09 DIAGNOSIS — D509 Iron deficiency anemia, unspecified: Secondary | ICD-10-CM | POA: Diagnosis not present

## 2018-12-11 DIAGNOSIS — N2581 Secondary hyperparathyroidism of renal origin: Secondary | ICD-10-CM | POA: Diagnosis not present

## 2018-12-11 DIAGNOSIS — D509 Iron deficiency anemia, unspecified: Secondary | ICD-10-CM | POA: Diagnosis not present

## 2018-12-11 DIAGNOSIS — D631 Anemia in chronic kidney disease: Secondary | ICD-10-CM | POA: Diagnosis not present

## 2018-12-11 DIAGNOSIS — N186 End stage renal disease: Secondary | ICD-10-CM | POA: Diagnosis not present

## 2018-12-11 DIAGNOSIS — Z992 Dependence on renal dialysis: Secondary | ICD-10-CM | POA: Diagnosis not present

## 2018-12-13 DIAGNOSIS — Z992 Dependence on renal dialysis: Secondary | ICD-10-CM | POA: Diagnosis not present

## 2018-12-13 DIAGNOSIS — D509 Iron deficiency anemia, unspecified: Secondary | ICD-10-CM | POA: Diagnosis not present

## 2018-12-13 DIAGNOSIS — N2581 Secondary hyperparathyroidism of renal origin: Secondary | ICD-10-CM | POA: Diagnosis not present

## 2018-12-13 DIAGNOSIS — D631 Anemia in chronic kidney disease: Secondary | ICD-10-CM | POA: Diagnosis not present

## 2018-12-13 DIAGNOSIS — N186 End stage renal disease: Secondary | ICD-10-CM | POA: Diagnosis not present

## 2018-12-16 DIAGNOSIS — D509 Iron deficiency anemia, unspecified: Secondary | ICD-10-CM | POA: Diagnosis not present

## 2018-12-16 DIAGNOSIS — Z992 Dependence on renal dialysis: Secondary | ICD-10-CM | POA: Diagnosis not present

## 2018-12-16 DIAGNOSIS — D631 Anemia in chronic kidney disease: Secondary | ICD-10-CM | POA: Diagnosis not present

## 2018-12-16 DIAGNOSIS — N2581 Secondary hyperparathyroidism of renal origin: Secondary | ICD-10-CM | POA: Diagnosis not present

## 2018-12-16 DIAGNOSIS — N186 End stage renal disease: Secondary | ICD-10-CM | POA: Diagnosis not present

## 2018-12-18 DIAGNOSIS — D509 Iron deficiency anemia, unspecified: Secondary | ICD-10-CM | POA: Diagnosis not present

## 2018-12-18 DIAGNOSIS — N186 End stage renal disease: Secondary | ICD-10-CM | POA: Diagnosis not present

## 2018-12-18 DIAGNOSIS — Z992 Dependence on renal dialysis: Secondary | ICD-10-CM | POA: Diagnosis not present

## 2018-12-18 DIAGNOSIS — N2581 Secondary hyperparathyroidism of renal origin: Secondary | ICD-10-CM | POA: Diagnosis not present

## 2018-12-18 DIAGNOSIS — D631 Anemia in chronic kidney disease: Secondary | ICD-10-CM | POA: Diagnosis not present

## 2018-12-19 DIAGNOSIS — N186 End stage renal disease: Secondary | ICD-10-CM | POA: Diagnosis not present

## 2018-12-19 DIAGNOSIS — Z992 Dependence on renal dialysis: Secondary | ICD-10-CM | POA: Diagnosis not present

## 2018-12-20 DIAGNOSIS — N2581 Secondary hyperparathyroidism of renal origin: Secondary | ICD-10-CM | POA: Diagnosis not present

## 2018-12-20 DIAGNOSIS — N186 End stage renal disease: Secondary | ICD-10-CM | POA: Diagnosis not present

## 2018-12-20 DIAGNOSIS — D631 Anemia in chronic kidney disease: Secondary | ICD-10-CM | POA: Diagnosis not present

## 2018-12-20 DIAGNOSIS — D509 Iron deficiency anemia, unspecified: Secondary | ICD-10-CM | POA: Diagnosis not present

## 2018-12-20 DIAGNOSIS — Z992 Dependence on renal dialysis: Secondary | ICD-10-CM | POA: Diagnosis not present

## 2018-12-23 DIAGNOSIS — D631 Anemia in chronic kidney disease: Secondary | ICD-10-CM | POA: Diagnosis not present

## 2018-12-23 DIAGNOSIS — N186 End stage renal disease: Secondary | ICD-10-CM | POA: Diagnosis not present

## 2018-12-23 DIAGNOSIS — Z992 Dependence on renal dialysis: Secondary | ICD-10-CM | POA: Diagnosis not present

## 2018-12-23 DIAGNOSIS — N2581 Secondary hyperparathyroidism of renal origin: Secondary | ICD-10-CM | POA: Diagnosis not present

## 2018-12-23 DIAGNOSIS — D509 Iron deficiency anemia, unspecified: Secondary | ICD-10-CM | POA: Diagnosis not present

## 2018-12-25 DIAGNOSIS — D631 Anemia in chronic kidney disease: Secondary | ICD-10-CM | POA: Diagnosis not present

## 2018-12-25 DIAGNOSIS — D509 Iron deficiency anemia, unspecified: Secondary | ICD-10-CM | POA: Diagnosis not present

## 2018-12-25 DIAGNOSIS — N2581 Secondary hyperparathyroidism of renal origin: Secondary | ICD-10-CM | POA: Diagnosis not present

## 2018-12-25 DIAGNOSIS — N186 End stage renal disease: Secondary | ICD-10-CM | POA: Diagnosis not present

## 2018-12-25 DIAGNOSIS — Z992 Dependence on renal dialysis: Secondary | ICD-10-CM | POA: Diagnosis not present

## 2018-12-27 DIAGNOSIS — D509 Iron deficiency anemia, unspecified: Secondary | ICD-10-CM | POA: Diagnosis not present

## 2018-12-27 DIAGNOSIS — N186 End stage renal disease: Secondary | ICD-10-CM | POA: Diagnosis not present

## 2018-12-27 DIAGNOSIS — N2581 Secondary hyperparathyroidism of renal origin: Secondary | ICD-10-CM | POA: Diagnosis not present

## 2018-12-27 DIAGNOSIS — D631 Anemia in chronic kidney disease: Secondary | ICD-10-CM | POA: Diagnosis not present

## 2018-12-27 DIAGNOSIS — Z992 Dependence on renal dialysis: Secondary | ICD-10-CM | POA: Diagnosis not present

## 2018-12-30 DIAGNOSIS — D509 Iron deficiency anemia, unspecified: Secondary | ICD-10-CM | POA: Diagnosis not present

## 2018-12-30 DIAGNOSIS — Z992 Dependence on renal dialysis: Secondary | ICD-10-CM | POA: Diagnosis not present

## 2018-12-30 DIAGNOSIS — N2581 Secondary hyperparathyroidism of renal origin: Secondary | ICD-10-CM | POA: Diagnosis not present

## 2018-12-30 DIAGNOSIS — N186 End stage renal disease: Secondary | ICD-10-CM | POA: Diagnosis not present

## 2018-12-30 DIAGNOSIS — D631 Anemia in chronic kidney disease: Secondary | ICD-10-CM | POA: Diagnosis not present

## 2019-01-01 DIAGNOSIS — D509 Iron deficiency anemia, unspecified: Secondary | ICD-10-CM | POA: Diagnosis not present

## 2019-01-01 DIAGNOSIS — D631 Anemia in chronic kidney disease: Secondary | ICD-10-CM | POA: Diagnosis not present

## 2019-01-01 DIAGNOSIS — Z992 Dependence on renal dialysis: Secondary | ICD-10-CM | POA: Diagnosis not present

## 2019-01-01 DIAGNOSIS — N2581 Secondary hyperparathyroidism of renal origin: Secondary | ICD-10-CM | POA: Diagnosis not present

## 2019-01-01 DIAGNOSIS — N186 End stage renal disease: Secondary | ICD-10-CM | POA: Diagnosis not present

## 2019-01-03 DIAGNOSIS — D509 Iron deficiency anemia, unspecified: Secondary | ICD-10-CM | POA: Diagnosis not present

## 2019-01-03 DIAGNOSIS — N186 End stage renal disease: Secondary | ICD-10-CM | POA: Diagnosis not present

## 2019-01-03 DIAGNOSIS — Z992 Dependence on renal dialysis: Secondary | ICD-10-CM | POA: Diagnosis not present

## 2019-01-03 DIAGNOSIS — N2581 Secondary hyperparathyroidism of renal origin: Secondary | ICD-10-CM | POA: Diagnosis not present

## 2019-01-03 DIAGNOSIS — D631 Anemia in chronic kidney disease: Secondary | ICD-10-CM | POA: Diagnosis not present

## 2019-01-06 DIAGNOSIS — D509 Iron deficiency anemia, unspecified: Secondary | ICD-10-CM | POA: Diagnosis not present

## 2019-01-06 DIAGNOSIS — Z992 Dependence on renal dialysis: Secondary | ICD-10-CM | POA: Diagnosis not present

## 2019-01-06 DIAGNOSIS — N186 End stage renal disease: Secondary | ICD-10-CM | POA: Diagnosis not present

## 2019-01-06 DIAGNOSIS — N2581 Secondary hyperparathyroidism of renal origin: Secondary | ICD-10-CM | POA: Diagnosis not present

## 2019-01-06 DIAGNOSIS — D631 Anemia in chronic kidney disease: Secondary | ICD-10-CM | POA: Diagnosis not present

## 2019-01-08 DIAGNOSIS — D631 Anemia in chronic kidney disease: Secondary | ICD-10-CM | POA: Diagnosis not present

## 2019-01-08 DIAGNOSIS — N2581 Secondary hyperparathyroidism of renal origin: Secondary | ICD-10-CM | POA: Diagnosis not present

## 2019-01-08 DIAGNOSIS — Z992 Dependence on renal dialysis: Secondary | ICD-10-CM | POA: Diagnosis not present

## 2019-01-08 DIAGNOSIS — D509 Iron deficiency anemia, unspecified: Secondary | ICD-10-CM | POA: Diagnosis not present

## 2019-01-08 DIAGNOSIS — N186 End stage renal disease: Secondary | ICD-10-CM | POA: Diagnosis not present

## 2019-01-10 DIAGNOSIS — N2581 Secondary hyperparathyroidism of renal origin: Secondary | ICD-10-CM | POA: Diagnosis not present

## 2019-01-10 DIAGNOSIS — Z992 Dependence on renal dialysis: Secondary | ICD-10-CM | POA: Diagnosis not present

## 2019-01-10 DIAGNOSIS — D509 Iron deficiency anemia, unspecified: Secondary | ICD-10-CM | POA: Diagnosis not present

## 2019-01-10 DIAGNOSIS — N186 End stage renal disease: Secondary | ICD-10-CM | POA: Diagnosis not present

## 2019-01-10 DIAGNOSIS — D631 Anemia in chronic kidney disease: Secondary | ICD-10-CM | POA: Diagnosis not present

## 2019-01-13 DIAGNOSIS — Z992 Dependence on renal dialysis: Secondary | ICD-10-CM | POA: Diagnosis not present

## 2019-01-13 DIAGNOSIS — N186 End stage renal disease: Secondary | ICD-10-CM | POA: Diagnosis not present

## 2019-01-13 DIAGNOSIS — D631 Anemia in chronic kidney disease: Secondary | ICD-10-CM | POA: Diagnosis not present

## 2019-01-13 DIAGNOSIS — N2581 Secondary hyperparathyroidism of renal origin: Secondary | ICD-10-CM | POA: Diagnosis not present

## 2019-01-13 DIAGNOSIS — D509 Iron deficiency anemia, unspecified: Secondary | ICD-10-CM | POA: Diagnosis not present

## 2019-01-15 DIAGNOSIS — Z992 Dependence on renal dialysis: Secondary | ICD-10-CM | POA: Diagnosis not present

## 2019-01-15 DIAGNOSIS — N2581 Secondary hyperparathyroidism of renal origin: Secondary | ICD-10-CM | POA: Diagnosis not present

## 2019-01-15 DIAGNOSIS — D509 Iron deficiency anemia, unspecified: Secondary | ICD-10-CM | POA: Diagnosis not present

## 2019-01-15 DIAGNOSIS — D631 Anemia in chronic kidney disease: Secondary | ICD-10-CM | POA: Diagnosis not present

## 2019-01-15 DIAGNOSIS — N186 End stage renal disease: Secondary | ICD-10-CM | POA: Diagnosis not present

## 2019-01-17 DIAGNOSIS — D631 Anemia in chronic kidney disease: Secondary | ICD-10-CM | POA: Diagnosis not present

## 2019-01-17 DIAGNOSIS — Z992 Dependence on renal dialysis: Secondary | ICD-10-CM | POA: Diagnosis not present

## 2019-01-17 DIAGNOSIS — N186 End stage renal disease: Secondary | ICD-10-CM | POA: Diagnosis not present

## 2019-01-17 DIAGNOSIS — D509 Iron deficiency anemia, unspecified: Secondary | ICD-10-CM | POA: Diagnosis not present

## 2019-01-17 DIAGNOSIS — N2581 Secondary hyperparathyroidism of renal origin: Secondary | ICD-10-CM | POA: Diagnosis not present

## 2019-01-20 DIAGNOSIS — Z992 Dependence on renal dialysis: Secondary | ICD-10-CM | POA: Diagnosis not present

## 2019-01-20 DIAGNOSIS — D509 Iron deficiency anemia, unspecified: Secondary | ICD-10-CM | POA: Diagnosis not present

## 2019-01-20 DIAGNOSIS — N2581 Secondary hyperparathyroidism of renal origin: Secondary | ICD-10-CM | POA: Diagnosis not present

## 2019-01-20 DIAGNOSIS — D631 Anemia in chronic kidney disease: Secondary | ICD-10-CM | POA: Diagnosis not present

## 2019-01-20 DIAGNOSIS — N186 End stage renal disease: Secondary | ICD-10-CM | POA: Diagnosis not present

## 2019-01-22 DIAGNOSIS — Z992 Dependence on renal dialysis: Secondary | ICD-10-CM | POA: Diagnosis not present

## 2019-01-22 DIAGNOSIS — N2581 Secondary hyperparathyroidism of renal origin: Secondary | ICD-10-CM | POA: Diagnosis not present

## 2019-01-22 DIAGNOSIS — N186 End stage renal disease: Secondary | ICD-10-CM | POA: Diagnosis not present

## 2019-01-22 DIAGNOSIS — D509 Iron deficiency anemia, unspecified: Secondary | ICD-10-CM | POA: Diagnosis not present

## 2019-01-22 DIAGNOSIS — D631 Anemia in chronic kidney disease: Secondary | ICD-10-CM | POA: Diagnosis not present

## 2019-01-24 DIAGNOSIS — D509 Iron deficiency anemia, unspecified: Secondary | ICD-10-CM | POA: Diagnosis not present

## 2019-01-24 DIAGNOSIS — N2581 Secondary hyperparathyroidism of renal origin: Secondary | ICD-10-CM | POA: Diagnosis not present

## 2019-01-24 DIAGNOSIS — D631 Anemia in chronic kidney disease: Secondary | ICD-10-CM | POA: Diagnosis not present

## 2019-01-24 DIAGNOSIS — Z992 Dependence on renal dialysis: Secondary | ICD-10-CM | POA: Diagnosis not present

## 2019-01-24 DIAGNOSIS — N186 End stage renal disease: Secondary | ICD-10-CM | POA: Diagnosis not present

## 2019-01-27 DIAGNOSIS — N2581 Secondary hyperparathyroidism of renal origin: Secondary | ICD-10-CM | POA: Diagnosis not present

## 2019-01-27 DIAGNOSIS — Z992 Dependence on renal dialysis: Secondary | ICD-10-CM | POA: Diagnosis not present

## 2019-01-27 DIAGNOSIS — D509 Iron deficiency anemia, unspecified: Secondary | ICD-10-CM | POA: Diagnosis not present

## 2019-01-27 DIAGNOSIS — N186 End stage renal disease: Secondary | ICD-10-CM | POA: Diagnosis not present

## 2019-01-27 DIAGNOSIS — D631 Anemia in chronic kidney disease: Secondary | ICD-10-CM | POA: Diagnosis not present

## 2019-01-29 DIAGNOSIS — N2581 Secondary hyperparathyroidism of renal origin: Secondary | ICD-10-CM | POA: Diagnosis not present

## 2019-01-29 DIAGNOSIS — N186 End stage renal disease: Secondary | ICD-10-CM | POA: Diagnosis not present

## 2019-01-29 DIAGNOSIS — Z992 Dependence on renal dialysis: Secondary | ICD-10-CM | POA: Diagnosis not present

## 2019-01-29 DIAGNOSIS — D631 Anemia in chronic kidney disease: Secondary | ICD-10-CM | POA: Diagnosis not present

## 2019-01-29 DIAGNOSIS — D509 Iron deficiency anemia, unspecified: Secondary | ICD-10-CM | POA: Diagnosis not present

## 2019-01-31 DIAGNOSIS — N2581 Secondary hyperparathyroidism of renal origin: Secondary | ICD-10-CM | POA: Diagnosis not present

## 2019-01-31 DIAGNOSIS — D631 Anemia in chronic kidney disease: Secondary | ICD-10-CM | POA: Diagnosis not present

## 2019-01-31 DIAGNOSIS — D509 Iron deficiency anemia, unspecified: Secondary | ICD-10-CM | POA: Diagnosis not present

## 2019-01-31 DIAGNOSIS — N186 End stage renal disease: Secondary | ICD-10-CM | POA: Diagnosis not present

## 2019-01-31 DIAGNOSIS — Z992 Dependence on renal dialysis: Secondary | ICD-10-CM | POA: Diagnosis not present

## 2019-02-03 DIAGNOSIS — D631 Anemia in chronic kidney disease: Secondary | ICD-10-CM | POA: Diagnosis not present

## 2019-02-03 DIAGNOSIS — N186 End stage renal disease: Secondary | ICD-10-CM | POA: Diagnosis not present

## 2019-02-03 DIAGNOSIS — Z992 Dependence on renal dialysis: Secondary | ICD-10-CM | POA: Diagnosis not present

## 2019-02-03 DIAGNOSIS — D509 Iron deficiency anemia, unspecified: Secondary | ICD-10-CM | POA: Diagnosis not present

## 2019-02-03 DIAGNOSIS — N2581 Secondary hyperparathyroidism of renal origin: Secondary | ICD-10-CM | POA: Diagnosis not present

## 2019-02-05 DIAGNOSIS — N186 End stage renal disease: Secondary | ICD-10-CM | POA: Diagnosis not present

## 2019-02-05 DIAGNOSIS — Z992 Dependence on renal dialysis: Secondary | ICD-10-CM | POA: Diagnosis not present

## 2019-02-05 DIAGNOSIS — N2581 Secondary hyperparathyroidism of renal origin: Secondary | ICD-10-CM | POA: Diagnosis not present

## 2019-02-05 DIAGNOSIS — D631 Anemia in chronic kidney disease: Secondary | ICD-10-CM | POA: Diagnosis not present

## 2019-02-05 DIAGNOSIS — D509 Iron deficiency anemia, unspecified: Secondary | ICD-10-CM | POA: Diagnosis not present

## 2019-02-07 DIAGNOSIS — N2581 Secondary hyperparathyroidism of renal origin: Secondary | ICD-10-CM | POA: Diagnosis not present

## 2019-02-07 DIAGNOSIS — D631 Anemia in chronic kidney disease: Secondary | ICD-10-CM | POA: Diagnosis not present

## 2019-02-07 DIAGNOSIS — Z992 Dependence on renal dialysis: Secondary | ICD-10-CM | POA: Diagnosis not present

## 2019-02-07 DIAGNOSIS — D509 Iron deficiency anemia, unspecified: Secondary | ICD-10-CM | POA: Diagnosis not present

## 2019-02-07 DIAGNOSIS — N186 End stage renal disease: Secondary | ICD-10-CM | POA: Diagnosis not present

## 2019-02-10 DIAGNOSIS — D509 Iron deficiency anemia, unspecified: Secondary | ICD-10-CM | POA: Diagnosis not present

## 2019-02-10 DIAGNOSIS — N2581 Secondary hyperparathyroidism of renal origin: Secondary | ICD-10-CM | POA: Diagnosis not present

## 2019-02-10 DIAGNOSIS — Z992 Dependence on renal dialysis: Secondary | ICD-10-CM | POA: Diagnosis not present

## 2019-02-10 DIAGNOSIS — N186 End stage renal disease: Secondary | ICD-10-CM | POA: Diagnosis not present

## 2019-02-10 DIAGNOSIS — D631 Anemia in chronic kidney disease: Secondary | ICD-10-CM | POA: Diagnosis not present

## 2019-02-12 DIAGNOSIS — N2581 Secondary hyperparathyroidism of renal origin: Secondary | ICD-10-CM | POA: Diagnosis not present

## 2019-02-12 DIAGNOSIS — D509 Iron deficiency anemia, unspecified: Secondary | ICD-10-CM | POA: Diagnosis not present

## 2019-02-12 DIAGNOSIS — N186 End stage renal disease: Secondary | ICD-10-CM | POA: Diagnosis not present

## 2019-02-12 DIAGNOSIS — Z992 Dependence on renal dialysis: Secondary | ICD-10-CM | POA: Diagnosis not present

## 2019-02-12 DIAGNOSIS — D631 Anemia in chronic kidney disease: Secondary | ICD-10-CM | POA: Diagnosis not present

## 2019-02-14 DIAGNOSIS — D509 Iron deficiency anemia, unspecified: Secondary | ICD-10-CM | POA: Diagnosis not present

## 2019-02-14 DIAGNOSIS — D631 Anemia in chronic kidney disease: Secondary | ICD-10-CM | POA: Diagnosis not present

## 2019-02-14 DIAGNOSIS — Z992 Dependence on renal dialysis: Secondary | ICD-10-CM | POA: Diagnosis not present

## 2019-02-14 DIAGNOSIS — N186 End stage renal disease: Secondary | ICD-10-CM | POA: Diagnosis not present

## 2019-02-14 DIAGNOSIS — N2581 Secondary hyperparathyroidism of renal origin: Secondary | ICD-10-CM | POA: Diagnosis not present

## 2019-02-17 DIAGNOSIS — D509 Iron deficiency anemia, unspecified: Secondary | ICD-10-CM | POA: Diagnosis not present

## 2019-02-17 DIAGNOSIS — N186 End stage renal disease: Secondary | ICD-10-CM | POA: Diagnosis not present

## 2019-02-17 DIAGNOSIS — N2581 Secondary hyperparathyroidism of renal origin: Secondary | ICD-10-CM | POA: Diagnosis not present

## 2019-02-17 DIAGNOSIS — D631 Anemia in chronic kidney disease: Secondary | ICD-10-CM | POA: Diagnosis not present

## 2019-02-17 DIAGNOSIS — Z992 Dependence on renal dialysis: Secondary | ICD-10-CM | POA: Diagnosis not present

## 2019-02-19 DIAGNOSIS — Z992 Dependence on renal dialysis: Secondary | ICD-10-CM | POA: Diagnosis not present

## 2019-02-19 DIAGNOSIS — N186 End stage renal disease: Secondary | ICD-10-CM | POA: Diagnosis not present

## 2019-02-19 DIAGNOSIS — D509 Iron deficiency anemia, unspecified: Secondary | ICD-10-CM | POA: Diagnosis not present

## 2019-02-19 DIAGNOSIS — D631 Anemia in chronic kidney disease: Secondary | ICD-10-CM | POA: Diagnosis not present

## 2019-02-19 DIAGNOSIS — N2581 Secondary hyperparathyroidism of renal origin: Secondary | ICD-10-CM | POA: Diagnosis not present

## 2019-02-21 DIAGNOSIS — D509 Iron deficiency anemia, unspecified: Secondary | ICD-10-CM | POA: Diagnosis not present

## 2019-02-21 DIAGNOSIS — N186 End stage renal disease: Secondary | ICD-10-CM | POA: Diagnosis not present

## 2019-02-21 DIAGNOSIS — D631 Anemia in chronic kidney disease: Secondary | ICD-10-CM | POA: Diagnosis not present

## 2019-02-21 DIAGNOSIS — N2581 Secondary hyperparathyroidism of renal origin: Secondary | ICD-10-CM | POA: Diagnosis not present

## 2019-02-21 DIAGNOSIS — Z992 Dependence on renal dialysis: Secondary | ICD-10-CM | POA: Diagnosis not present

## 2019-02-24 DIAGNOSIS — D509 Iron deficiency anemia, unspecified: Secondary | ICD-10-CM | POA: Diagnosis not present

## 2019-02-24 DIAGNOSIS — N186 End stage renal disease: Secondary | ICD-10-CM | POA: Diagnosis not present

## 2019-02-24 DIAGNOSIS — Z992 Dependence on renal dialysis: Secondary | ICD-10-CM | POA: Diagnosis not present

## 2019-02-24 DIAGNOSIS — N2581 Secondary hyperparathyroidism of renal origin: Secondary | ICD-10-CM | POA: Diagnosis not present

## 2019-02-24 DIAGNOSIS — D631 Anemia in chronic kidney disease: Secondary | ICD-10-CM | POA: Diagnosis not present

## 2019-02-26 DIAGNOSIS — D509 Iron deficiency anemia, unspecified: Secondary | ICD-10-CM | POA: Diagnosis not present

## 2019-02-26 DIAGNOSIS — N2581 Secondary hyperparathyroidism of renal origin: Secondary | ICD-10-CM | POA: Diagnosis not present

## 2019-02-26 DIAGNOSIS — D631 Anemia in chronic kidney disease: Secondary | ICD-10-CM | POA: Diagnosis not present

## 2019-02-26 DIAGNOSIS — Z992 Dependence on renal dialysis: Secondary | ICD-10-CM | POA: Diagnosis not present

## 2019-02-26 DIAGNOSIS — N186 End stage renal disease: Secondary | ICD-10-CM | POA: Diagnosis not present

## 2019-02-27 ENCOUNTER — Other Ambulatory Visit: Payer: Self-pay | Admitting: Cardiovascular Disease

## 2019-02-28 DIAGNOSIS — N186 End stage renal disease: Secondary | ICD-10-CM | POA: Diagnosis not present

## 2019-02-28 DIAGNOSIS — D631 Anemia in chronic kidney disease: Secondary | ICD-10-CM | POA: Diagnosis not present

## 2019-02-28 DIAGNOSIS — D509 Iron deficiency anemia, unspecified: Secondary | ICD-10-CM | POA: Diagnosis not present

## 2019-02-28 DIAGNOSIS — N2581 Secondary hyperparathyroidism of renal origin: Secondary | ICD-10-CM | POA: Diagnosis not present

## 2019-02-28 DIAGNOSIS — Z992 Dependence on renal dialysis: Secondary | ICD-10-CM | POA: Diagnosis not present

## 2019-03-03 DIAGNOSIS — N186 End stage renal disease: Secondary | ICD-10-CM | POA: Diagnosis not present

## 2019-03-03 DIAGNOSIS — Z992 Dependence on renal dialysis: Secondary | ICD-10-CM | POA: Diagnosis not present

## 2019-03-03 DIAGNOSIS — D509 Iron deficiency anemia, unspecified: Secondary | ICD-10-CM | POA: Diagnosis not present

## 2019-03-03 DIAGNOSIS — N2581 Secondary hyperparathyroidism of renal origin: Secondary | ICD-10-CM | POA: Diagnosis not present

## 2019-03-03 DIAGNOSIS — D631 Anemia in chronic kidney disease: Secondary | ICD-10-CM | POA: Diagnosis not present

## 2019-03-05 DIAGNOSIS — Z992 Dependence on renal dialysis: Secondary | ICD-10-CM | POA: Diagnosis not present

## 2019-03-05 DIAGNOSIS — D631 Anemia in chronic kidney disease: Secondary | ICD-10-CM | POA: Diagnosis not present

## 2019-03-05 DIAGNOSIS — N2581 Secondary hyperparathyroidism of renal origin: Secondary | ICD-10-CM | POA: Diagnosis not present

## 2019-03-05 DIAGNOSIS — N186 End stage renal disease: Secondary | ICD-10-CM | POA: Diagnosis not present

## 2019-03-05 DIAGNOSIS — D509 Iron deficiency anemia, unspecified: Secondary | ICD-10-CM | POA: Diagnosis not present

## 2019-03-07 DIAGNOSIS — N2581 Secondary hyperparathyroidism of renal origin: Secondary | ICD-10-CM | POA: Diagnosis not present

## 2019-03-07 DIAGNOSIS — D509 Iron deficiency anemia, unspecified: Secondary | ICD-10-CM | POA: Diagnosis not present

## 2019-03-07 DIAGNOSIS — D631 Anemia in chronic kidney disease: Secondary | ICD-10-CM | POA: Diagnosis not present

## 2019-03-07 DIAGNOSIS — Z992 Dependence on renal dialysis: Secondary | ICD-10-CM | POA: Diagnosis not present

## 2019-03-07 DIAGNOSIS — N186 End stage renal disease: Secondary | ICD-10-CM | POA: Diagnosis not present

## 2019-03-10 DIAGNOSIS — N2581 Secondary hyperparathyroidism of renal origin: Secondary | ICD-10-CM | POA: Diagnosis not present

## 2019-03-10 DIAGNOSIS — D509 Iron deficiency anemia, unspecified: Secondary | ICD-10-CM | POA: Diagnosis not present

## 2019-03-10 DIAGNOSIS — Z992 Dependence on renal dialysis: Secondary | ICD-10-CM | POA: Diagnosis not present

## 2019-03-10 DIAGNOSIS — N186 End stage renal disease: Secondary | ICD-10-CM | POA: Diagnosis not present

## 2019-03-10 DIAGNOSIS — D631 Anemia in chronic kidney disease: Secondary | ICD-10-CM | POA: Diagnosis not present

## 2019-03-12 DIAGNOSIS — N2581 Secondary hyperparathyroidism of renal origin: Secondary | ICD-10-CM | POA: Diagnosis not present

## 2019-03-12 DIAGNOSIS — Z992 Dependence on renal dialysis: Secondary | ICD-10-CM | POA: Diagnosis not present

## 2019-03-12 DIAGNOSIS — N186 End stage renal disease: Secondary | ICD-10-CM | POA: Diagnosis not present

## 2019-03-12 DIAGNOSIS — D631 Anemia in chronic kidney disease: Secondary | ICD-10-CM | POA: Diagnosis not present

## 2019-03-12 DIAGNOSIS — D509 Iron deficiency anemia, unspecified: Secondary | ICD-10-CM | POA: Diagnosis not present

## 2019-03-14 DIAGNOSIS — D631 Anemia in chronic kidney disease: Secondary | ICD-10-CM | POA: Diagnosis not present

## 2019-03-14 DIAGNOSIS — N2581 Secondary hyperparathyroidism of renal origin: Secondary | ICD-10-CM | POA: Diagnosis not present

## 2019-03-14 DIAGNOSIS — N186 End stage renal disease: Secondary | ICD-10-CM | POA: Diagnosis not present

## 2019-03-14 DIAGNOSIS — Z992 Dependence on renal dialysis: Secondary | ICD-10-CM | POA: Diagnosis not present

## 2019-03-14 DIAGNOSIS — D509 Iron deficiency anemia, unspecified: Secondary | ICD-10-CM | POA: Diagnosis not present

## 2019-03-17 DIAGNOSIS — Z992 Dependence on renal dialysis: Secondary | ICD-10-CM | POA: Diagnosis not present

## 2019-03-17 DIAGNOSIS — N2581 Secondary hyperparathyroidism of renal origin: Secondary | ICD-10-CM | POA: Diagnosis not present

## 2019-03-17 DIAGNOSIS — N186 End stage renal disease: Secondary | ICD-10-CM | POA: Diagnosis not present

## 2019-03-17 DIAGNOSIS — D509 Iron deficiency anemia, unspecified: Secondary | ICD-10-CM | POA: Diagnosis not present

## 2019-03-17 DIAGNOSIS — D631 Anemia in chronic kidney disease: Secondary | ICD-10-CM | POA: Diagnosis not present

## 2019-03-19 DIAGNOSIS — D509 Iron deficiency anemia, unspecified: Secondary | ICD-10-CM | POA: Diagnosis not present

## 2019-03-19 DIAGNOSIS — D631 Anemia in chronic kidney disease: Secondary | ICD-10-CM | POA: Diagnosis not present

## 2019-03-19 DIAGNOSIS — N186 End stage renal disease: Secondary | ICD-10-CM | POA: Diagnosis not present

## 2019-03-19 DIAGNOSIS — Z992 Dependence on renal dialysis: Secondary | ICD-10-CM | POA: Diagnosis not present

## 2019-03-19 DIAGNOSIS — N2581 Secondary hyperparathyroidism of renal origin: Secondary | ICD-10-CM | POA: Diagnosis not present

## 2019-03-21 DIAGNOSIS — D631 Anemia in chronic kidney disease: Secondary | ICD-10-CM | POA: Diagnosis not present

## 2019-03-21 DIAGNOSIS — N2581 Secondary hyperparathyroidism of renal origin: Secondary | ICD-10-CM | POA: Diagnosis not present

## 2019-03-21 DIAGNOSIS — D509 Iron deficiency anemia, unspecified: Secondary | ICD-10-CM | POA: Diagnosis not present

## 2019-03-21 DIAGNOSIS — N186 End stage renal disease: Secondary | ICD-10-CM | POA: Diagnosis not present

## 2019-03-21 DIAGNOSIS — Z992 Dependence on renal dialysis: Secondary | ICD-10-CM | POA: Diagnosis not present

## 2019-03-24 DIAGNOSIS — N2581 Secondary hyperparathyroidism of renal origin: Secondary | ICD-10-CM | POA: Diagnosis not present

## 2019-03-24 DIAGNOSIS — D631 Anemia in chronic kidney disease: Secondary | ICD-10-CM | POA: Diagnosis not present

## 2019-03-24 DIAGNOSIS — N186 End stage renal disease: Secondary | ICD-10-CM | POA: Diagnosis not present

## 2019-03-24 DIAGNOSIS — D509 Iron deficiency anemia, unspecified: Secondary | ICD-10-CM | POA: Diagnosis not present

## 2019-03-24 DIAGNOSIS — Z992 Dependence on renal dialysis: Secondary | ICD-10-CM | POA: Diagnosis not present

## 2019-03-26 DIAGNOSIS — D509 Iron deficiency anemia, unspecified: Secondary | ICD-10-CM | POA: Diagnosis not present

## 2019-03-26 DIAGNOSIS — Z992 Dependence on renal dialysis: Secondary | ICD-10-CM | POA: Diagnosis not present

## 2019-03-26 DIAGNOSIS — D631 Anemia in chronic kidney disease: Secondary | ICD-10-CM | POA: Diagnosis not present

## 2019-03-26 DIAGNOSIS — N186 End stage renal disease: Secondary | ICD-10-CM | POA: Diagnosis not present

## 2019-03-26 DIAGNOSIS — N2581 Secondary hyperparathyroidism of renal origin: Secondary | ICD-10-CM | POA: Diagnosis not present

## 2019-03-28 DIAGNOSIS — Z992 Dependence on renal dialysis: Secondary | ICD-10-CM | POA: Diagnosis not present

## 2019-03-28 DIAGNOSIS — N186 End stage renal disease: Secondary | ICD-10-CM | POA: Diagnosis not present

## 2019-03-28 DIAGNOSIS — D631 Anemia in chronic kidney disease: Secondary | ICD-10-CM | POA: Diagnosis not present

## 2019-03-28 DIAGNOSIS — N2581 Secondary hyperparathyroidism of renal origin: Secondary | ICD-10-CM | POA: Diagnosis not present

## 2019-03-28 DIAGNOSIS — D509 Iron deficiency anemia, unspecified: Secondary | ICD-10-CM | POA: Diagnosis not present

## 2019-03-31 DIAGNOSIS — Z992 Dependence on renal dialysis: Secondary | ICD-10-CM | POA: Diagnosis not present

## 2019-03-31 DIAGNOSIS — N186 End stage renal disease: Secondary | ICD-10-CM | POA: Diagnosis not present

## 2019-03-31 DIAGNOSIS — D509 Iron deficiency anemia, unspecified: Secondary | ICD-10-CM | POA: Diagnosis not present

## 2019-03-31 DIAGNOSIS — N2581 Secondary hyperparathyroidism of renal origin: Secondary | ICD-10-CM | POA: Diagnosis not present

## 2019-03-31 DIAGNOSIS — D631 Anemia in chronic kidney disease: Secondary | ICD-10-CM | POA: Diagnosis not present

## 2019-04-01 ENCOUNTER — Other Ambulatory Visit: Payer: Self-pay | Admitting: *Deleted

## 2019-04-01 ENCOUNTER — Ambulatory Visit (INDEPENDENT_AMBULATORY_CARE_PROVIDER_SITE_OTHER): Payer: Medicare Other | Admitting: Vascular Surgery

## 2019-04-01 ENCOUNTER — Other Ambulatory Visit: Payer: Self-pay

## 2019-04-01 ENCOUNTER — Encounter: Payer: Self-pay | Admitting: Vascular Surgery

## 2019-04-01 ENCOUNTER — Encounter: Payer: Self-pay | Admitting: *Deleted

## 2019-04-01 VITALS — BP 151/98 | HR 90 | Temp 97.6°F | Resp 20 | Ht 64.0 in | Wt 138.0 lb

## 2019-04-01 DIAGNOSIS — Z992 Dependence on renal dialysis: Secondary | ICD-10-CM | POA: Diagnosis not present

## 2019-04-01 DIAGNOSIS — N186 End stage renal disease: Secondary | ICD-10-CM

## 2019-04-01 NOTE — Progress Notes (Signed)
Patient name: [name] MRN: [id number] DOB: 1975/11/27 Sex: female  REASON FOR VISIT:   Pseudoaneurysm of AV graft.  The consult is requested by Dr. Lowanda Foster.   HPI:   ESTELLA [name] is a pleasant 43 y.o. female who was last seen in our office on 11/24/2018 by the physician's assistant.  The patient had a left upper arm loop placed 6 years ago.  She has had multiple procedures on the graft and most recently this was worked on in late 2019 by [facility].  She had a fistulogram with drug-coated balloon angioplasty of the venous anastomosis.  At the time of her last visit her graft was working well and she had no steal symptoms.  She was to follow-up as needed.  She tells me that she developed swelling along the medial aspect of her left upper arm loop AV graft approximately 2 weeks ago.  This has gradually been enlarging.  She denies any bleeding problems.  She dialyzes on Tuesdays Thursdays and Saturdays.  She has not had any recent uremic symptoms.  Past Medical History:  Diagnosis Date  . Allergy   . Anemia   . Avascular necrosis of bone (HCC)    left tibial talus due to chronic prednisone use  . 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 GERD (gastroesophageal reflux disease)   . Headache(784.0)   . Hyperlipemia   . Hypertension   . Lupus ([place])   . Secondary hyperparathyroidism (of renal origin)     Family History  Problem Relation Age of Onset  . Asthma Sister   . Hypertension Mother   . Heart attack Mother 72       died at 66  . COPD Maternal Uncle        prostate  . Mental illness Neg Hx     SOCIAL HISTORY: Social History   Tobacco Use  . Smoking status: Never Smoker  . Smokeless tobacco: Never Used  Substance Use Topics  . Alcohol use: No    Allergies  Allergen Reactions  . Amlodipine Hives    Swelling  . Sulfa Antibiotics Hives and Itching  . Vancomycin  Hives and Itching    Current Outpatient Medications  Medication Sig Dispense Refill  . acetaminophen (TYLENOL) 500 MG tablet Take 500 mg by mouth daily as needed for headache.     . calcium acetate (PHOSLO) 667 MG capsule Take 1,334-2,001 mg by mouth See admin instructions. Take 2001 mg with each meal and 1334 mg with each snack    . diltiazem (CARDIZEM CD) 120 MG 24 hr capsule Take 240 mg by mouth at bedtime.    . hydrALAZINE (APRESOLINE) 25 MG tablet TAKE 1 TABLET BY MOUTH TWICE DAILY 180 tablet 0  . lisinopril (PRINIVIL,ZESTRIL) 40 MG tablet Take 40 mg by mouth at bedtime.     . Multiple Vitamin (MULTIVITAMIN WITH MINERALS) TABS tablet Take 1 tablet by mouth daily.    . multivitamin (RENA-VIT) TABS tablet TK 1 T PO QD    . pantoprazole (PROTONIX) 40 MG tablet Take 1 tablet (40 mg total) by mouth daily. 30 tablet 3  . PARoxetine (PAXIL) 20 MG tablet Take 1 tablet (20 mg total) by mouth daily. 90 tablet 3  . predniSONE (DELTASONE) 10 MG tablet Take 1 tablet (10 mg total) by mouth daily with breakfast. 90 tablet 3  . sevelamer carbonate (RENVELA) 800 MG  tablet Take by mouth.    . traZODone (DESYREL) 50 MG tablet Take 0.5-1 tablets (25-50 mg total) by mouth at bedtime. (Patient taking differently: Take 25 mg by mouth at bedtime. ) 90 tablet 3   No current facility-administered medications for this visit.     REVIEW OF SYSTEMS:  [X]  denotes positive finding, [ ]  denotes negative finding Cardiac  Comments:  Chest pain or chest pressure:    Shortness of breath upon exertion:    Short of breath when lying flat:    Irregular heart rhythm:        Vascular    Pain in calf, thigh, or hip brought on by ambulation:    Pain in feet at night that wakes you up from your sleep:     Blood clot in your veins:    Leg swelling:  x       Pulmonary    Oxygen at home:    Productive cough:     Wheezing:         Neurologic    Sudden weakness in arms or legs:     Sudden numbness in arms or legs:      Sudden onset of difficulty speaking or slurred speech:    Temporary loss of vision in one eye:     Problems with dizziness:         Gastrointestinal    Blood in stool:     Vomited blood:         Genitourinary    Burning when urinating:     Blood in urine:        Psychiatric    Major depression:         Hematologic    Bleeding problems:    Problems with blood clotting too easily:        Skin    Rashes or ulcers:        Constitutional    Fever or chills:     PHYSICAL EXAM:   Vitals:   04/01/19 0930  BP: (!) 151/98  Pulse: 90  Resp: 20  Temp: 97.6 F (36.4 C)  SpO2: 96%  Weight: 138 lb (62.6 kg)  Height: 5\' 4"  (1.626 m)    GENERAL: The patient is a well-nourished female, in no acute distress. The vital signs are documented above. CARDIAC: There is a regular rate and rhythm.  VASCULAR: She has a palpable thrill and audible bruit in her left upper arm loop graft. She has a palpable left radial pulse. She has a large pulsatile mass along the medial aspect of her left graft.  She tells me that this is along the arterial limb of the graft although I am not sure if this is correct. This measures approximately 6 to 7 cm in diameter. PULMONARY: There is good air exchange bilaterally without wheezing or rales. ABDOMEN: Soft and non-tender with normal pitched bowel sounds.  MUSCULOSKELETAL: There are no major deformities or cyanosis. NEUROLOGIC: No focal weakness or paresthesias are detected. SKIN: There are no ulcers or rashes noted. PSYCHIATRIC: The patient has a normal affect.  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  For this reason I have recommended elective repair this coming Friday which is the soonest date available given her dialysis schedule.  We will likely have to bypass around  this area.  I  explained that there is a small chance we would have to place a tunneled dialysis catheter if we could not preserve her access.  This graft is been in for many years and I have explained that ultimately she will likely require new access.  Her surgery is scheduled for Friday.  [name] [facility] [phone]

## 2019-04-01 NOTE — H&P (View-Only) (Signed)
Patient name: [name] MRN: [id number] DOB: 10-27-1976 Sex: female  REASON FOR VISIT:   Pseudoaneurysm of AV graft.  The consult is requested by Dr. Lowanda Foster.   HPI:   [name] is a pleasant 43 y.o. female who was last seen in our office on 11/24/2018 by the physician's assistant.  The patient had a left upper arm loop placed 6 years ago.  She has had multiple procedures on the graft and most recently this was worked on in late 2019 by [facility].  She had a fistulogram with drug-coated balloon angioplasty of the venous anastomosis.  At the time of her last visit her graft was working well and she had no steal symptoms.  She was to follow-up as needed.  She tells me that she developed swelling along the medial aspect of her left upper arm loop AV graft approximately 2 weeks ago.  This has gradually been enlarging.  She denies any bleeding problems.  She dialyzes on Tuesdays Thursdays and Saturdays.  She has not had any recent uremic symptoms.  Past Medical History:  Diagnosis Date  . Allergy   . Anemia   . Avascular necrosis of bone (HCC)    left tibial talus due to chronic prednisone use  . 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 GERD (gastroesophageal reflux disease)   . Headache(784.0)   . Hyperlipemia   . Hypertension   . Lupus ([place])   . Secondary hyperparathyroidism (of renal origin)     Family History  Problem Relation Age of Onset  . Asthma Sister   . Hypertension Mother   . Heart attack Mother 80       died at 35  . COPD Maternal Uncle        prostate  . Mental illness Neg Hx     SOCIAL HISTORY: Social History   Tobacco Use  . Smoking status: Never Smoker  . Smokeless tobacco: Never Used  Substance Use Topics  . Alcohol use: No    Allergies  Allergen Reactions  . Amlodipine Hives    Swelling  . Sulfa Antibiotics Hives and Itching  . Vancomycin  Hives and Itching    Current Outpatient Medications  Medication Sig Dispense Refill  . acetaminophen (TYLENOL) 500 MG tablet Take 500 mg by mouth daily as needed for headache.     . calcium acetate (PHOSLO) 667 MG capsule Take 1,334-2,001 mg by mouth See admin instructions. Take 2001 mg with each meal and 1334 mg with each snack    . diltiazem (CARDIZEM CD) 120 MG 24 hr capsule Take 240 mg by mouth at bedtime.    . hydrALAZINE (APRESOLINE) 25 MG tablet TAKE 1 TABLET BY MOUTH TWICE DAILY 180 tablet 0  . lisinopril (PRINIVIL,ZESTRIL) 40 MG tablet Take 40 mg by mouth at bedtime.     . Multiple Vitamin (MULTIVITAMIN WITH MINERALS) TABS tablet Take 1 tablet by mouth daily.    . multivitamin (RENA-VIT) TABS tablet TK 1 T PO QD    . pantoprazole (PROTONIX) 40 MG tablet Take 1 tablet (40 mg total) by mouth daily. 30 tablet 3  . PARoxetine (PAXIL) 20 MG tablet Take 1 tablet (20 mg total) by mouth daily. 90 tablet 3  . predniSONE (DELTASONE) 10 MG tablet Take 1 tablet (10 mg total) by mouth daily with breakfast. 90 tablet 3  . sevelamer carbonate (RENVELA) 800 MG  tablet Take by mouth.    . traZODone (DESYREL) 50 MG tablet Take 0.5-1 tablets (25-50 mg total) by mouth at bedtime. (Patient taking differently: Take 25 mg by mouth at bedtime. ) 90 tablet 3   No current facility-administered medications for this visit.     REVIEW OF SYSTEMS:  [X]  denotes positive finding, [ ]  denotes negative finding Cardiac  Comments:  Chest pain or chest pressure:    Shortness of breath upon exertion:    Short of breath when lying flat:    Irregular heart rhythm:        Vascular    Pain in calf, thigh, or hip brought on by ambulation:    Pain in feet at night that wakes you up from your sleep:     Blood clot in your veins:    Leg swelling:  x       Pulmonary    Oxygen at home:    Productive cough:     Wheezing:         Neurologic    Sudden weakness in arms or legs:     Sudden numbness in arms or legs:      Sudden onset of difficulty speaking or slurred speech:    Temporary loss of vision in one eye:     Problems with dizziness:         Gastrointestinal    Blood in stool:     Vomited blood:         Genitourinary    Burning when urinating:     Blood in urine:        Psychiatric    Major depression:         Hematologic    Bleeding problems:    Problems with blood clotting too easily:        Skin    Rashes or ulcers:        Constitutional    Fever or chills:     PHYSICAL EXAM:   Vitals:   04/01/19 0930  BP: (!) 151/98  Pulse: 90  Resp: 20  Temp: 97.6 F (36.4 C)  SpO2: 96%  Weight: 138 lb (62.6 kg)  Height: 5\' 4"  (1.626 m)    GENERAL: The patient is a well-nourished female, in no acute distress. The vital signs are documented above. CARDIAC: There is a regular rate and rhythm.  VASCULAR: She has a palpable thrill and audible bruit in her left upper arm loop graft. She has a palpable left radial pulse. She has a large pulsatile mass along the medial aspect of her left graft.  She tells me that this is along the arterial limb of the graft although I am not sure if this is correct. This measures approximately 6 to 7 cm in diameter. PULMONARY: There is good air exchange bilaterally without wheezing or rales. ABDOMEN: Soft and non-tender with normal pitched bowel sounds.  MUSCULOSKELETAL: There are no major deformities or cyanosis. NEUROLOGIC: No focal weakness or paresthesias are detected. SKIN: There are no ulcers or rashes noted. PSYCHIATRIC: The patient has a normal affect.  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  For this reason I have recommended elective repair this coming Friday which is the soonest date available given her dialysis schedule.  We will likely have to bypass around  this area.  I  explained that there is a small chance we would have to place a tunneled dialysis catheter if we could not preserve her access.  This graft is been in for many years and I have explained that ultimately she will likely require new access.  Her surgery is scheduled for Friday.  [name] [facility] [phone]

## 2019-04-02 ENCOUNTER — Other Ambulatory Visit: Payer: Self-pay

## 2019-04-02 ENCOUNTER — Encounter (HOSPITAL_COMMUNITY): Payer: Self-pay | Admitting: *Deleted

## 2019-04-02 DIAGNOSIS — N186 End stage renal disease: Secondary | ICD-10-CM | POA: Diagnosis not present

## 2019-04-02 DIAGNOSIS — D631 Anemia in chronic kidney disease: Secondary | ICD-10-CM | POA: Diagnosis not present

## 2019-04-02 DIAGNOSIS — Z992 Dependence on renal dialysis: Secondary | ICD-10-CM | POA: Diagnosis not present

## 2019-04-02 DIAGNOSIS — D509 Iron deficiency anemia, unspecified: Secondary | ICD-10-CM | POA: Diagnosis not present

## 2019-04-02 DIAGNOSIS — N2581 Secondary hyperparathyroidism of renal origin: Secondary | ICD-10-CM | POA: Diagnosis not present

## 2019-04-02 NOTE — Anesthesia Preprocedure Evaluation (Addendum)
Anesthesia Evaluation  Patient identified by MRN, date of birth, ID band Patient awake    Reviewed: Allergy & Precautions, NPO status , Patient's Chart, lab work & pertinent test results  Airway Mallampati: II  TM Distance: >3 FB Neck ROM: Full    Dental  (+) Teeth Intact, Dental Advisory Given   Pulmonary    breath sounds clear to auscultation       Cardiovascular hypertension,  Rhythm:Regular Rate:Normal     Neuro/Psych    GI/Hepatic   Endo/Other    Renal/GU      Musculoskeletal   Abdominal   Peds  Hematology   Anesthesia Other Findings   Reproductive/Obstetrics                            Anesthesia Physical Anesthesia Plan  ASA: III  Anesthesia Plan: General   Post-op Pain Management:    Induction: Intravenous  PONV Risk Score and Plan: Ondansetron and Dexamethasone  Airway Management Planned: LMA  Additional Equipment:   Intra-op Plan:   Post-operative Plan:   Informed Consent: I have reviewed the patients History and Physical, chart, labs and discussed the procedure including the risks, benefits and alternatives for the proposed anesthesia with the patient or authorized representative who has indicated his/her understanding and acceptance.     Dental advisory given  Plan Discussed with: CRNA and Anesthesiologist  Anesthesia Plan Comments: (Seen by Dr. Bronson Ing for eval of palpitations 02/17/2018. Event monitoring demonstrated sinus rhythm with rare PACs. Echocardiogram demonstrated normal left ventricular systolic function, LVEF 13-24%, severe LVH with grade 1 diastolic dysfunction and high ventricular filling pressures, moderate to severe mitral annular calcification with mild mitral stenosis and mild to moderate mitral regurgitation.  There is mild to moderate left atrial dilatation.  Pulmonary pressures were only mildly increased.  Stress echo 10/24/2018 at [facility] as  part of transplant workup:  NORMAL STRESS TEST. NORMAL RESTING STUDY EF 55% WITH NO WALL MOTION ABNORMALITIES  AT REST AND PEAK STRESS.  VALVULAR REGURGITATION: TRIVIAL AR, MILD MR, TRIVIAL PR, MILD TR  VALVULAR STENOSIS: MILD MS  Note: HYPERDYNAMIC LEFT VENTRICLE WITH SMALL LEFT VENTRICULAR DIAMETERS  Maximum workload of 7.00 METs was achieved during exercise.  RESTING HYPERTENSION - APPROPRIATE RESPONSE)       Anesthesia Quick Evaluation

## 2019-04-02 NOTE — Progress Notes (Signed)
[name] denies chest pain, palpations or shortness of breath. Patient denies that she or her family has experienced any of the following: Cough Fever >100.4 Runny Nose Sore Throat Difficulty breathing/ shortness of breath Travel in past 14 days- none  Patient will have COVID screen at 0930 at [facility].  ( appointment states 1145, because there were no earlier appointments, but patient will be at [facility] at 0930.

## 2019-04-03 ENCOUNTER — Ambulatory Visit (HOSPITAL_COMMUNITY): Payer: Medicare Other | Admitting: Physician Assistant

## 2019-04-03 ENCOUNTER — Ambulatory Visit (HOSPITAL_COMMUNITY)
Admission: RE | Admit: 2019-04-03 | Discharge: 2019-04-03 | Disposition: A | Discharge status: Home / Self Care | Payer: Medicare Other | Attending: Vascular Surgery | Admitting: Vascular Surgery

## 2019-04-03 ENCOUNTER — Encounter (HOSPITAL_COMMUNITY): Payer: Self-pay | Admitting: *Deleted

## 2019-04-03 ENCOUNTER — Other Ambulatory Visit (HOSPITAL_COMMUNITY)
Admission: RE | Admit: 2019-04-03 | Discharge: 2019-04-03 | Disposition: A | Discharge status: Home / Self Care | Payer: Medicare Other | Source: Ambulatory Visit | Attending: Vascular Surgery | Admitting: Vascular Surgery

## 2019-04-03 ENCOUNTER — Encounter (HOSPITAL_COMMUNITY)
Admission: RE | Disposition: A | Discharge status: Home / Self Care | Payer: Self-pay | Source: Home / Self Care | Attending: Vascular Surgery

## 2019-04-03 ENCOUNTER — Other Ambulatory Visit: Payer: Self-pay

## 2019-04-03 DIAGNOSIS — D631 Anemia in chronic kidney disease: Secondary | ICD-10-CM | POA: Insufficient documentation

## 2019-04-03 DIAGNOSIS — I721 Aneurysm of artery of upper extremity: Secondary | ICD-10-CM | POA: Diagnosis not present

## 2019-04-03 DIAGNOSIS — Z7952 Long term (current) use of systemic steroids: Secondary | ICD-10-CM | POA: Diagnosis not present

## 2019-04-03 DIAGNOSIS — Z1159 Encounter for screening for other viral diseases: Secondary | ICD-10-CM | POA: Diagnosis not present

## 2019-04-03 DIAGNOSIS — Y832 Surgical operation with anastomosis, bypass or graft as the cause of abnormal reaction of the patient, or of later complication, without mention of misadventure at the time of the procedure: Secondary | ICD-10-CM | POA: Diagnosis not present

## 2019-04-03 DIAGNOSIS — Z992 Dependence on renal dialysis: Secondary | ICD-10-CM | POA: Diagnosis not present

## 2019-04-03 DIAGNOSIS — K219 Gastro-esophageal reflux disease without esophagitis: Secondary | ICD-10-CM | POA: Insufficient documentation

## 2019-04-03 DIAGNOSIS — N186 End stage renal disease: Secondary | ICD-10-CM | POA: Insufficient documentation

## 2019-04-03 DIAGNOSIS — Z79899 Other long term (current) drug therapy: Secondary | ICD-10-CM | POA: Diagnosis not present

## 2019-04-03 DIAGNOSIS — I120 Hypertensive chronic kidney disease with stage 5 chronic kidney disease or end stage renal disease: Secondary | ICD-10-CM | POA: Insufficient documentation

## 2019-04-03 DIAGNOSIS — T82898A Other specified complication of vascular prosthetic devices, implants and grafts, initial encounter: Secondary | ICD-10-CM | POA: Insufficient documentation

## 2019-04-03 DIAGNOSIS — M329 Systemic lupus erythematosus, unspecified: Secondary | ICD-10-CM | POA: Insufficient documentation

## 2019-04-03 DIAGNOSIS — N2581 Secondary hyperparathyroidism of renal origin: Secondary | ICD-10-CM | POA: Insufficient documentation

## 2019-04-03 HISTORY — DX: Unspecified osteoarthritis, unspecified site: M19.90

## 2019-04-03 HISTORY — PX: REVISION OF ARTERIOVENOUS GORETEX GRAFT: SHX6073

## 2019-04-03 LAB — POCT I-STAT 4, (NA,K, GLUC, HGB,HCT)
Glucose, Bld: 117 mg/dL — ABNORMAL HIGH (ref 70–99)
HCT: 34 % — ABNORMAL LOW (ref 36.0–46.0)
Hemoglobin: 11.6 g/dL — ABNORMAL LOW (ref 12.0–15.0)
Potassium: 5.4 mmol/L — ABNORMAL HIGH (ref 3.5–5.1)
Sodium: 135 mmol/L (ref 135–145)

## 2019-04-03 LAB — HCG, SERUM, QUALITATIVE: Preg, Serum: NEGATIVE

## 2019-04-03 LAB — SARS CORONAVIRUS 2 BY RT PCR (HOSPITAL ORDER, PERFORMED IN ~~LOC~~ HOSPITAL LAB): SARS Coronavirus 2: NEGATIVE

## 2019-04-03 SURGERY — REVISION OF ARTERIOVENOUS GORETEX GRAFT
Anesthesia: General | Site: Arm Upper | Laterality: Left

## 2019-04-03 MED ORDER — PROPOFOL 10 MG/ML IV BOLUS
INTRAVENOUS | Status: AC
  Filled 2019-04-03: qty 20

## 2019-04-03 MED ORDER — FENTANYL CITRATE (PF) 100 MCG/2ML IJ SOLN
INTRAMUSCULAR | Status: DC | PRN
  Administered 2019-04-03 (×4): 25 ug via INTRAVENOUS

## 2019-04-03 MED ORDER — MIDAZOLAM HCL 5 MG/5ML IJ SOLN
INTRAMUSCULAR | Status: DC | PRN
  Administered 2019-04-03: 2 mg via INTRAVENOUS

## 2019-04-03 MED ORDER — ONDANSETRON HCL 4 MG/2ML IJ SOLN: 4.0000 mg | Freq: Once | INTRAMUSCULAR | Status: DC | PRN

## 2019-04-03 MED ORDER — PHENYLEPHRINE 40 MCG/ML (10ML) SYRINGE FOR IV PUSH (FOR BLOOD PRESSURE SUPPORT)
PREFILLED_SYRINGE | INTRAVENOUS | Status: DC | PRN
  Administered 2019-04-03: 80 ug via INTRAVENOUS
  Administered 2019-04-03: 120 ug via INTRAVENOUS
  Administered 2019-04-03 (×2): 80 ug via INTRAVENOUS
  Administered 2019-04-03: 40 ug via INTRAVENOUS
  Administered 2019-04-03: 80 ug via INTRAVENOUS

## 2019-04-03 MED ORDER — ONDANSETRON HCL 4 MG/2ML IJ SOLN
INTRAMUSCULAR | Status: DC | PRN
  Administered 2019-04-03: 4 mg via INTRAVENOUS

## 2019-04-03 MED ORDER — LIDOCAINE 2% (20 MG/ML) 5 ML SYRINGE
INTRAMUSCULAR | Status: DC | PRN
  Administered 2019-04-03: 60 mg via INTRAVENOUS

## 2019-04-03 MED ORDER — PROTAMINE SULFATE 10 MG/ML IV SOLN
INTRAVENOUS | Status: DC | PRN
  Administered 2019-04-03: 40 mg via INTRAVENOUS

## 2019-04-03 MED ORDER — EPHEDRINE SULFATE-NACL 50-0.9 MG/10ML-% IV SOSY
PREFILLED_SYRINGE | INTRAVENOUS | Status: DC | PRN
  Administered 2019-04-03: 5 mg via INTRAVENOUS

## 2019-04-03 MED ORDER — SODIUM CHLORIDE 0.9 % IV SOLN
INTRAVENOUS | Status: DC
  Administered 2019-04-03 (×2): via INTRAVENOUS

## 2019-04-03 MED ORDER — DEXAMETHASONE SODIUM PHOSPHATE 10 MG/ML IJ SOLN
INTRAMUSCULAR | Status: DC | PRN
  Administered 2019-04-03: 4 mg via INTRAVENOUS

## 2019-04-03 MED ORDER — ONDANSETRON HCL 4 MG/2ML IJ SOLN
INTRAMUSCULAR | Status: AC
  Filled 2019-04-03: qty 2

## 2019-04-03 MED ORDER — MIDAZOLAM HCL 2 MG/2ML IJ SOLN
INTRAMUSCULAR | Status: AC
  Filled 2019-04-03: qty 2

## 2019-04-03 MED ORDER — EPHEDRINE 5 MG/ML INJ
INTRAVENOUS | Status: AC
  Filled 2019-04-03: qty 10

## 2019-04-03 MED ORDER — SODIUM CHLORIDE 0.9 % IV SOLN
INTRAVENOUS | Status: DC | PRN
  Administered 2019-04-03: 500 mL

## 2019-04-03 MED ORDER — PROPOFOL 10 MG/ML IV BOLUS
INTRAVENOUS | Status: DC | PRN
  Administered 2019-04-03: 50 mg via INTRAVENOUS
  Administered 2019-04-03: 150 mg via INTRAVENOUS

## 2019-04-03 MED ORDER — CEFAZOLIN SODIUM-DEXTROSE 2-4 GM/100ML-% IV SOLN
2.0000 g | INTRAVENOUS | Status: AC
  Administered 2019-04-03: 14:00:00 2 g via INTRAVENOUS
  Filled 2019-04-03: qty 100

## 2019-04-03 MED ORDER — FENTANYL CITRATE (PF) 100 MCG/2ML IJ SOLN
INTRAMUSCULAR | Status: AC
  Filled 2019-04-03: qty 2

## 2019-04-03 MED ORDER — FENTANYL CITRATE (PF) 100 MCG/2ML IJ SOLN
25.0000 ug | INTRAMUSCULAR | Status: DC | PRN
  Administered 2019-04-03: 25 ug via INTRAVENOUS

## 2019-04-03 MED ORDER — FENTANYL CITRATE (PF) 250 MCG/5ML IJ SOLN
INTRAMUSCULAR | Status: AC
  Filled 2019-04-03: qty 5

## 2019-04-03 MED ORDER — 0.9 % SODIUM CHLORIDE (POUR BTL) OPTIME
TOPICAL | Status: DC | PRN
  Administered 2019-04-03: 1000 mL

## 2019-04-03 MED ORDER — SODIUM CHLORIDE 0.9 % IV SOLN
INTRAVENOUS | Status: DC | PRN
  Administered 2019-04-03: 14:00:00 20 ug/min via INTRAVENOUS

## 2019-04-03 MED ORDER — DEXAMETHASONE SODIUM PHOSPHATE 10 MG/ML IJ SOLN
INTRAMUSCULAR | Status: AC
  Filled 2019-04-03: qty 1

## 2019-04-03 MED ORDER — PHENYLEPHRINE 40 MCG/ML (10ML) SYRINGE FOR IV PUSH (FOR BLOOD PRESSURE SUPPORT)
PREFILLED_SYRINGE | INTRAVENOUS | Status: AC
  Filled 2019-04-03: qty 10

## 2019-04-03 MED ORDER — OXYCODONE HCL 5 MG PO TABS: 5.0000 mg | ORAL_TABLET | ORAL | 0 refills | Status: DC | PRN

## 2019-04-03 MED ORDER — LIDOCAINE 2% (20 MG/ML) 5 ML SYRINGE
INTRAMUSCULAR | Status: AC
  Filled 2019-04-03: qty 5

## 2019-04-03 MED ORDER — HEPARIN SODIUM (PORCINE) 1000 UNIT/ML IJ SOLN
INTRAMUSCULAR | Status: DC | PRN
  Administered 2019-04-03: 6000 [IU] via INTRAVENOUS

## 2019-04-03 MED ORDER — HEPARIN SODIUM (PORCINE) 1000 UNIT/ML IJ SOLN
INTRAMUSCULAR | Status: AC
  Filled 2019-04-03: qty 2

## 2019-04-03 SURGICAL SUPPLY — 43 items
ADH SKN CLS APL DERMABOND .7 (GAUZE/BANDAGES/DRESSINGS) ×1
BANDAGE ELASTIC 6 VELCRO ST LF (GAUZE/BANDAGES/DRESSINGS) ×2 IMPLANT
BNDG GAUZE ELAST 4 BULKY (GAUZE/BANDAGES/DRESSINGS) ×2 IMPLANT
CANISTER SUCT 3000ML PPV (MISCELLANEOUS) ×3 IMPLANT
CANNULA VESSEL 3MM 2 BLNT TIP (CANNULA) ×3 IMPLANT
CATH EMB 4FR 40CM (CATHETERS) ×6 IMPLANT
CLIP VESOCCLUDE MED 6/CT (CLIP) ×3 IMPLANT
CLIP VESOCCLUDE SM WIDE 6/CT (CLIP) ×3 IMPLANT
COVER WAND RF STERILE (DRAPES) ×3 IMPLANT
DECANTER SPIKE VIAL GLASS SM (MISCELLANEOUS) ×3 IMPLANT
DERMABOND ADVANCED (GAUZE/BANDAGES/DRESSINGS) ×2
DERMABOND ADVANCED .7 DNX12 (GAUZE/BANDAGES/DRESSINGS) ×1 IMPLANT
DRAPE INCISE IOBAN 66X45 STRL (DRAPES) ×3 IMPLANT
ELECT REM PT RETURN 9FT ADLT (ELECTROSURGICAL) ×3
ELECTRODE REM PT RTRN 9FT ADLT (ELECTROSURGICAL) ×1 IMPLANT
GLOVE BIO SURGEON STRL SZ7.5 (GLOVE) ×5 IMPLANT
GLOVE BIOGEL PI IND STRL 6.5 (GLOVE) IMPLANT
GLOVE BIOGEL PI IND STRL 7.0 (GLOVE) IMPLANT
GLOVE BIOGEL PI IND STRL 8 (GLOVE) ×1 IMPLANT
GLOVE BIOGEL PI INDICATOR 6.5 (GLOVE) ×2
GLOVE BIOGEL PI INDICATOR 7.0 (GLOVE) ×2
GLOVE BIOGEL PI INDICATOR 8 (GLOVE) ×2
GLOVE SURG SS PI 6.5 STRL IVOR (GLOVE) ×2 IMPLANT
GOWN STRL REUS W/ TWL LRG LVL3 (GOWN DISPOSABLE) ×3 IMPLANT
GOWN STRL REUS W/TWL LRG LVL3 (GOWN DISPOSABLE) ×9
GOWN STRL REUS W/TWL XL LVL3 (GOWN DISPOSABLE) ×2 IMPLANT
GRAFT GORETEX STRT 7X10 (Vascular Products) ×2 IMPLANT
KIT BASIN OR (CUSTOM PROCEDURE TRAY) ×3 IMPLANT
KIT TURNOVER KIT B (KITS) ×3 IMPLANT
NDL HYPO 25GX1X1/2 BEV (NEEDLE) ×1 IMPLANT
NEEDLE HYPO 25GX1X1/2 BEV (NEEDLE) ×3 IMPLANT
NS IRRIG 1000ML POUR BTL (IV SOLUTION) ×3 IMPLANT
PACK CV ACCESS (CUSTOM PROCEDURE TRAY) ×3 IMPLANT
PAD ARMBOARD 7.5X6 YLW CONV (MISCELLANEOUS) ×6 IMPLANT
SPONGE SURGIFOAM ABS GEL 100 (HEMOSTASIS) IMPLANT
SUT PROLENE 6 0 BV (SUTURE) ×10 IMPLANT
SUT VIC AB 3-0 SH 27 (SUTURE) ×9
SUT VIC AB 3-0 SH 27X BRD (SUTURE) ×2 IMPLANT
SUT VIC AB 4-0 PS2 18 (SUTURE) ×2 IMPLANT
SUT VICRYL 4-0 PS2 18IN ABS (SUTURE) ×6 IMPLANT
TOWEL GREEN STERILE (TOWEL DISPOSABLE) ×3 IMPLANT
UNDERPAD 30X30 (UNDERPADS AND DIAPERS) ×3 IMPLANT
WATER STERILE IRR 1000ML POUR (IV SOLUTION) ×3 IMPLANT

## 2019-04-03 NOTE — Op Note (Signed)
    NAME: [name]    MRN: [id number] DOB: [birth year]    DATE OF OPERATION: 04/03/2019  PREOP DIAGNOSIS:    Aneurysm left upper arm AV graft  POSTOP DIAGNOSIS:    Same  PROCEDURE:    Revision of left upper arm AV graft with interposition 7 mm PTFE graft and repair of pseudoaneurysm  SURGEON: Judeth Cornfield. Scot Dock, MD, FACS  ASSIST: Arlee Muslim, PA  ANESTHESIA: General  EBL: Minimal  INDICATIONS:    [name] is a 43 y.o. female who had a left upper arm loop graft placed 6 years ago.  She presented with a large aneurysm along the medial aspect of the graft which she said has been present for approximately 2 weeks and gradually enlarging.  I recommended removal.  FINDINGS:   She had a large aneurysm along the medial aspect of her graft where the entire lateral aspect of the graft had degenerated.  I replaced the segment with an interposition 7 mm PTFE graft.  There was one focal area of some cloudy fluid which I cultured.  TECHNIQUE:   The patient was taken to the operating room and received a general anesthetic.  The left arm was prepped and draped in usual sterile fashion.  An incision was made over the distal aspect of the graft where the graft was dissected free.  It was markedly calcified.  A separate incision was made along the ulnar aspect of the upper arm over the graft proximal to the aneurysm.  The patient was heparinized.  The graft was clamped in both locations and then the large pseudoaneurysm was opened.  Of note in creating tunneled there was a small area of some fluid that was present and this was cultured.  There were no other areas to suggest infection.  There was no evidence of infection of the pseudoaneurysm.  Once the pseudoaneurysm was opened a large amount of laminated thrombus was removed.  I then tunneled between the 2 incisions and a 7 mm PTFE graft was tunneled between the 2 incisions.  After I endarterectomized calcium at both ends of the  graft the graft was sewn into end at both ends using continuous 6-0 Prolene suture.  At the completion there was a thrill in the graft which was confirmed with Doppler.  It was a radial and ulnar signal with the Doppler.  The heparin was partially reversed with protamine.  The pseudoaneurysm was closed with 2 deep layers of 3-0 Vicryl and the skin closed with 4-0 Vicryl.  The incisions over the new segment of graft were closed with a deep layer of 3-0 Vicryl and the skin closed with 4-0 Vicryl.  Dermabond was applied.  The patient tolerated the procedure well and was transferred to the recovery room in stable condition.  All needle and sponge counts were correct.  [name], MD, FACS [facility]  DATE OF DICTATION:   04/03/2019

## 2019-04-03 NOTE — Interval H&P Note (Signed)
History and Physical Interval Note:  04/03/2019 1:12 PM  [name]  has presented today for surgery, with the diagnosis of left arm pseudoaneurysm.  The various methods of treatment have been discussed with the patient and family. After consideration of risks, benefits and other options for treatment, the patient has consented to  Procedure(s): REVISION OF ARTERIOVENOUS GORETEX GRAFT PSEUDOANEURYSM (Left) as a surgical intervention.  The patient's history has been reviewed, patient examined, no change in status, stable for surgery.  I have reviewed the patient's chart and labs.  Questions were answered to the patient's satisfaction.     [name]

## 2019-04-03 NOTE — Anesthesia Procedure Notes (Signed)
Procedure Name: LMA Insertion Performed by: Milford Cage, CRNA Pre-anesthesia Checklist: Patient identified, Emergency Drugs available, Suction available and Patient being monitored Patient Re-evaluated:Patient Re-evaluated prior to induction Oxygen Delivery Method: Circle System Utilized Preoxygenation: Pre-oxygenation with 100% oxygen Induction Type: IV induction LMA: LMA inserted LMA Size: 4.0 Number of attempts: 1 Airway Equipment and Method: Bite block Placement Confirmation: positive ETCO2 Tube secured with: Tape Dental Injury: Injury to lip  Comments: Injury to lip from jagged front tooth

## 2019-04-03 NOTE — Transfer of Care (Signed)
Immediate Anesthesia Transfer of Care Note  Patient: [name]  Procedure(s) Performed: REVISION OF ARTERIOVENOUS GORETEX GRAFT PSEUDOANEURYSM (Left Arm Upper)  Patient Location: PACU  Anesthesia Type:General  Level of Consciousness: awake  Airway & Oxygen Therapy: Patient Spontanous Breathing  Post-op Assessment: Report given to RN and Post -op Vital signs reviewed and stable  Post vital signs: Reviewed and stable  Last Vitals:  Vitals Value Taken Time  BP 94/72 04/03/2019  3:38 PM  Temp    Pulse 103 04/03/2019  3:40 PM  Resp 20 04/03/2019  3:40 PM  SpO2 97 % 04/03/2019  3:40 PM  Vitals shown include unvalidated device data.  Last Pain:  Vitals:   04/03/19 0948  TempSrc:   PainSc: 0-No pain      Patients Stated Pain Goal: 4 (26/94/85 [id number])  Complications: No apparent anesthesia complications

## 2019-04-04 DIAGNOSIS — D631 Anemia in chronic kidney disease: Secondary | ICD-10-CM | POA: Diagnosis not present

## 2019-04-04 DIAGNOSIS — D509 Iron deficiency anemia, unspecified: Secondary | ICD-10-CM | POA: Diagnosis not present

## 2019-04-04 DIAGNOSIS — N186 End stage renal disease: Secondary | ICD-10-CM | POA: Diagnosis not present

## 2019-04-04 DIAGNOSIS — N2581 Secondary hyperparathyroidism of renal origin: Secondary | ICD-10-CM | POA: Diagnosis not present

## 2019-04-04 DIAGNOSIS — Z992 Dependence on renal dialysis: Secondary | ICD-10-CM | POA: Diagnosis not present

## 2019-04-04 NOTE — Anesthesia Postprocedure Evaluation (Signed)
Anesthesia Post Note  Patient: [name]  Procedure(s) Performed: REVISION OF ARTERIOVENOUS GORETEX GRAFT PSEUDOANEURYSM (Left Arm Upper)     Patient location during evaluation: PACU Anesthesia Type: General Level of consciousness: awake and alert Pain management: pain level controlled Vital Signs Assessment: post-procedure vital signs reviewed and stable Respiratory status: spontaneous breathing, nonlabored ventilation, respiratory function stable and patient connected to nasal cannula oxygen Cardiovascular status: blood pressure returned to baseline and stable Postop Assessment: no apparent nausea or vomiting Anesthetic complications: no    Last Vitals:  Vitals:   04/03/19 1610 04/03/19 1625  BP: 96/71 96/72  Pulse: 95 88  Resp: 13 13  Temp:    SpO2: 96% 99%    Last Pain:  Vitals:   04/03/19 1625  TempSrc:   PainSc: 3                  Yaret Hush COKER

## 2019-04-06 ENCOUNTER — Encounter (HOSPITAL_COMMUNITY): Payer: Self-pay | Admitting: Vascular Surgery

## 2019-04-06 ENCOUNTER — Telehealth: Payer: Self-pay | Admitting: Vascular Surgery

## 2019-04-06 NOTE — Telephone Encounter (Signed)
sch appt spk to pt mld ltr 04/29/2019 10am p/o MD

## 2019-04-06 NOTE — Telephone Encounter (Signed)
-----   Message from Mena Goes, RN sent at 04/03/2019  4:07 PM EDT ----- Regarding: 2 WEEKS NO LABS  ----- Message ----- From: Angelia Mould, MD Sent: 04/03/2019   3:31 PM EDT To: Vvs Charge Pool Subject: charge and f/u                                  PROCEDURE:   Revision of left upper arm AV graft with interposition 7 mm PTFE graft and repair of pseudoaneurysm  SURGEON: Judeth Cornfield. Scot Dock, MD, FACS  ASSIST: Arlee Muslim, PA  She will need a follow-up visit in approximately 2 to 3 weeks. CD

## 2019-04-07 DIAGNOSIS — Z992 Dependence on renal dialysis: Secondary | ICD-10-CM | POA: Diagnosis not present

## 2019-04-07 DIAGNOSIS — D509 Iron deficiency anemia, unspecified: Secondary | ICD-10-CM | POA: Diagnosis not present

## 2019-04-07 DIAGNOSIS — N2581 Secondary hyperparathyroidism of renal origin: Secondary | ICD-10-CM | POA: Diagnosis not present

## 2019-04-07 DIAGNOSIS — D631 Anemia in chronic kidney disease: Secondary | ICD-10-CM | POA: Diagnosis not present

## 2019-04-07 DIAGNOSIS — N186 End stage renal disease: Secondary | ICD-10-CM | POA: Diagnosis not present

## 2019-04-08 ENCOUNTER — Ambulatory Visit: Payer: Medicare Other | Admitting: Vascular Surgery

## 2019-04-08 LAB — AEROBIC/ANAEROBIC CULTURE W GRAM STAIN (SURGICAL/DEEP WOUND)
Culture: NO GROWTH
Gram Stain: NONE SEEN

## 2019-04-09 DIAGNOSIS — D509 Iron deficiency anemia, unspecified: Secondary | ICD-10-CM | POA: Diagnosis not present

## 2019-04-09 DIAGNOSIS — Z992 Dependence on renal dialysis: Secondary | ICD-10-CM | POA: Diagnosis not present

## 2019-04-09 DIAGNOSIS — N2581 Secondary hyperparathyroidism of renal origin: Secondary | ICD-10-CM | POA: Diagnosis not present

## 2019-04-09 DIAGNOSIS — N186 End stage renal disease: Secondary | ICD-10-CM | POA: Diagnosis not present

## 2019-04-09 DIAGNOSIS — D631 Anemia in chronic kidney disease: Secondary | ICD-10-CM | POA: Diagnosis not present

## 2019-04-11 DIAGNOSIS — N186 End stage renal disease: Secondary | ICD-10-CM | POA: Diagnosis not present

## 2019-04-11 DIAGNOSIS — N2581 Secondary hyperparathyroidism of renal origin: Secondary | ICD-10-CM | POA: Diagnosis not present

## 2019-04-11 DIAGNOSIS — D509 Iron deficiency anemia, unspecified: Secondary | ICD-10-CM | POA: Diagnosis not present

## 2019-04-11 DIAGNOSIS — Z992 Dependence on renal dialysis: Secondary | ICD-10-CM | POA: Diagnosis not present

## 2019-04-11 DIAGNOSIS — D631 Anemia in chronic kidney disease: Secondary | ICD-10-CM | POA: Diagnosis not present

## 2019-04-14 DIAGNOSIS — D631 Anemia in chronic kidney disease: Secondary | ICD-10-CM | POA: Diagnosis not present

## 2019-04-14 DIAGNOSIS — N186 End stage renal disease: Secondary | ICD-10-CM | POA: Diagnosis not present

## 2019-04-14 DIAGNOSIS — D509 Iron deficiency anemia, unspecified: Secondary | ICD-10-CM | POA: Diagnosis not present

## 2019-04-14 DIAGNOSIS — Z992 Dependence on renal dialysis: Secondary | ICD-10-CM | POA: Diagnosis not present

## 2019-04-14 DIAGNOSIS — N2581 Secondary hyperparathyroidism of renal origin: Secondary | ICD-10-CM | POA: Diagnosis not present

## 2019-04-16 DIAGNOSIS — N186 End stage renal disease: Secondary | ICD-10-CM | POA: Diagnosis not present

## 2019-04-16 DIAGNOSIS — Z992 Dependence on renal dialysis: Secondary | ICD-10-CM | POA: Diagnosis not present

## 2019-04-16 DIAGNOSIS — N2581 Secondary hyperparathyroidism of renal origin: Secondary | ICD-10-CM | POA: Diagnosis not present

## 2019-04-16 DIAGNOSIS — D631 Anemia in chronic kidney disease: Secondary | ICD-10-CM | POA: Diagnosis not present

## 2019-04-16 DIAGNOSIS — D509 Iron deficiency anemia, unspecified: Secondary | ICD-10-CM | POA: Diagnosis not present

## 2019-04-18 DIAGNOSIS — Z992 Dependence on renal dialysis: Secondary | ICD-10-CM | POA: Diagnosis not present

## 2019-04-18 DIAGNOSIS — N2581 Secondary hyperparathyroidism of renal origin: Secondary | ICD-10-CM | POA: Diagnosis not present

## 2019-04-18 DIAGNOSIS — N186 End stage renal disease: Secondary | ICD-10-CM | POA: Diagnosis not present

## 2019-04-18 DIAGNOSIS — D509 Iron deficiency anemia, unspecified: Secondary | ICD-10-CM | POA: Diagnosis not present

## 2019-04-18 DIAGNOSIS — D631 Anemia in chronic kidney disease: Secondary | ICD-10-CM | POA: Diagnosis not present

## 2019-04-19 DIAGNOSIS — Z992 Dependence on renal dialysis: Secondary | ICD-10-CM | POA: Diagnosis not present

## 2019-04-19 DIAGNOSIS — N186 End stage renal disease: Secondary | ICD-10-CM | POA: Diagnosis not present

## 2019-04-21 DIAGNOSIS — N2581 Secondary hyperparathyroidism of renal origin: Secondary | ICD-10-CM | POA: Diagnosis not present

## 2019-04-21 DIAGNOSIS — D509 Iron deficiency anemia, unspecified: Secondary | ICD-10-CM | POA: Diagnosis not present

## 2019-04-21 DIAGNOSIS — Z992 Dependence on renal dialysis: Secondary | ICD-10-CM | POA: Diagnosis not present

## 2019-04-21 DIAGNOSIS — D631 Anemia in chronic kidney disease: Secondary | ICD-10-CM | POA: Diagnosis not present

## 2019-04-21 DIAGNOSIS — N186 End stage renal disease: Secondary | ICD-10-CM | POA: Diagnosis not present

## 2019-04-23 DIAGNOSIS — D631 Anemia in chronic kidney disease: Secondary | ICD-10-CM | POA: Diagnosis not present

## 2019-04-23 DIAGNOSIS — N186 End stage renal disease: Secondary | ICD-10-CM | POA: Diagnosis not present

## 2019-04-23 DIAGNOSIS — N2581 Secondary hyperparathyroidism of renal origin: Secondary | ICD-10-CM | POA: Diagnosis not present

## 2019-04-23 DIAGNOSIS — D509 Iron deficiency anemia, unspecified: Secondary | ICD-10-CM | POA: Diagnosis not present

## 2019-04-23 DIAGNOSIS — Z992 Dependence on renal dialysis: Secondary | ICD-10-CM | POA: Diagnosis not present

## 2019-04-25 DIAGNOSIS — D631 Anemia in chronic kidney disease: Secondary | ICD-10-CM | POA: Diagnosis not present

## 2019-04-25 DIAGNOSIS — N2581 Secondary hyperparathyroidism of renal origin: Secondary | ICD-10-CM | POA: Diagnosis not present

## 2019-04-25 DIAGNOSIS — D509 Iron deficiency anemia, unspecified: Secondary | ICD-10-CM | POA: Diagnosis not present

## 2019-04-25 DIAGNOSIS — Z992 Dependence on renal dialysis: Secondary | ICD-10-CM | POA: Diagnosis not present

## 2019-04-25 DIAGNOSIS — N186 End stage renal disease: Secondary | ICD-10-CM | POA: Diagnosis not present

## 2019-04-28 DIAGNOSIS — D509 Iron deficiency anemia, unspecified: Secondary | ICD-10-CM | POA: Diagnosis not present

## 2019-04-28 DIAGNOSIS — N186 End stage renal disease: Secondary | ICD-10-CM | POA: Diagnosis not present

## 2019-04-28 DIAGNOSIS — Z992 Dependence on renal dialysis: Secondary | ICD-10-CM | POA: Diagnosis not present

## 2019-04-28 DIAGNOSIS — N2581 Secondary hyperparathyroidism of renal origin: Secondary | ICD-10-CM | POA: Diagnosis not present

## 2019-04-28 DIAGNOSIS — D631 Anemia in chronic kidney disease: Secondary | ICD-10-CM | POA: Diagnosis not present

## 2019-04-29 ENCOUNTER — Ambulatory Visit (INDEPENDENT_AMBULATORY_CARE_PROVIDER_SITE_OTHER): Payer: Self-pay | Admitting: Vascular Surgery

## 2019-04-29 ENCOUNTER — Other Ambulatory Visit: Payer: Self-pay

## 2019-04-29 ENCOUNTER — Encounter: Payer: Self-pay | Admitting: Vascular Surgery

## 2019-04-29 VITALS — BP 120/77 | HR 88 | Temp 97.6°F | Resp 20 | Ht 64.0 in | Wt 138.0 lb

## 2019-04-29 DIAGNOSIS — N186 End stage renal disease: Secondary | ICD-10-CM

## 2019-04-29 DIAGNOSIS — Z992 Dependence on renal dialysis: Secondary | ICD-10-CM

## 2019-04-29 NOTE — Progress Notes (Signed)
Patient name: [name] MRN: [id number] DOB: 05-02-1976 Sex: female  REASON FOR VISIT:   Follow-up after revision of left upper arm graft.  HPI:   [name] is a pleasant 43 y.o. 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  This area has been gradually enlarging and was quite impressive.  On 04/03/2019 she underwent revision of the graft with an interposition 7 mm PTFE graft and repair of the pseudoaneurysm.  Today she has no specific complaints.  Current Outpatient Medications  Medication Sig Dispense Refill  . acetaminophen (TYLENOL) 500 MG tablet Take 500 mg by mouth daily as needed for headache.     . diltiazem (TIAZAC) 240 MG 24 hr capsule Take 240 mg by mouth daily.    . hydrALAZINE (APRESOLINE) 25 MG tablet TAKE 1 TABLET BY MOUTH TWICE DAILY (Patient taking differently: Take 25 mg by mouth 2 (two) times a day. ) 180 tablet 0  . lisinopril (PRINIVIL,ZESTRIL) 40 MG tablet Take 80 mg by mouth at bedtime.     . multivitamin (RENA-VIT) TABS tablet Take 1 tablet by mouth daily.     Marland Kitchen oxyCODONE (ROXICODONE) 5 MG immediate release tablet Take 1 tablet (5 mg total) by mouth every 4 (four) hours as needed. 15 tablet 0  . pantoprazole (PROTONIX) 40 MG tablet Take 1 tablet (40 mg total) by mouth daily. 30 tablet 3  . PARoxetine (PAXIL) 20 MG tablet Take 1 tablet (20 mg total) by mouth daily. 90 tablet 3  . PARoxetine (PAXIL) 30 MG tablet Take 30 mg by mouth daily.    . predniSONE (DELTASONE) 10 MG tablet Take 1 tablet (10 mg total) by mouth daily with breakfast. 90 tablet 3  . sevelamer carbonate (RENVELA) 800 MG tablet Take 800 mg by mouth 3 (three) times daily with meals.     . traZODone (DESYREL) 50 MG tablet Take 0.5-1 tablets (25-50 mg total) by mouth at bedtime. (Patient taking differently: Take 50 mg by mouth at bedtime. ) 90 tablet 3   No current facility-administered  medications for this visit.     REVIEW OF SYSTEMS:  [X]  denotes positive finding, [ ]  denotes negative finding Vascular    Leg swelling    Cardiac    Chest pain or chest pressure:    Shortness of breath upon exertion:    Short of breath when lying flat:    Irregular heart rhythm:    Constitutional    Fever or chills:     PHYSICAL EXAM:   Vitals:   04/29/19 0932  BP: 120/77  Pulse: 88  Resp: 20  Temp: 97.6 F (36.4 C)  SpO2: 97%  Weight: 138 lb (62.6 kg)  Height: 5\' 4"  (1.626 m)    GENERAL: The patient is a well-nourished female, in no acute distress. The vital signs are documented above. CARDIOVASCULAR: There is a regular rate and rhythm. PULMONARY: There is good air exchange bilaterally without wheezing or rales. Her graft has an excellent thrill.  Her incisions are healing nicely. She has a palpable left radial pulse.  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  They can use the ulnar aspect of her graft starting in July.  I will see her back as needed.  [name] [facility]  [employer] [phone]

## 2019-04-30 DIAGNOSIS — N2581 Secondary hyperparathyroidism of renal origin: Secondary | ICD-10-CM | POA: Diagnosis not present

## 2019-04-30 DIAGNOSIS — D509 Iron deficiency anemia, unspecified: Secondary | ICD-10-CM | POA: Diagnosis not present

## 2019-04-30 DIAGNOSIS — D631 Anemia in chronic kidney disease: Secondary | ICD-10-CM | POA: Diagnosis not present

## 2019-04-30 DIAGNOSIS — Z992 Dependence on renal dialysis: Secondary | ICD-10-CM | POA: Diagnosis not present

## 2019-04-30 DIAGNOSIS — N186 End stage renal disease: Secondary | ICD-10-CM | POA: Diagnosis not present

## 2019-05-02 DIAGNOSIS — D631 Anemia in chronic kidney disease: Secondary | ICD-10-CM | POA: Diagnosis not present

## 2019-05-02 DIAGNOSIS — Z992 Dependence on renal dialysis: Secondary | ICD-10-CM | POA: Diagnosis not present

## 2019-05-02 DIAGNOSIS — D509 Iron deficiency anemia, unspecified: Secondary | ICD-10-CM | POA: Diagnosis not present

## 2019-05-02 DIAGNOSIS — N2581 Secondary hyperparathyroidism of renal origin: Secondary | ICD-10-CM | POA: Diagnosis not present

## 2019-05-02 DIAGNOSIS — N186 End stage renal disease: Secondary | ICD-10-CM | POA: Diagnosis not present

## 2019-05-05 DIAGNOSIS — N2581 Secondary hyperparathyroidism of renal origin: Secondary | ICD-10-CM | POA: Diagnosis not present

## 2019-05-05 DIAGNOSIS — D631 Anemia in chronic kidney disease: Secondary | ICD-10-CM | POA: Diagnosis not present

## 2019-05-05 DIAGNOSIS — N186 End stage renal disease: Secondary | ICD-10-CM | POA: Diagnosis not present

## 2019-05-05 DIAGNOSIS — Z992 Dependence on renal dialysis: Secondary | ICD-10-CM | POA: Diagnosis not present

## 2019-05-05 DIAGNOSIS — D509 Iron deficiency anemia, unspecified: Secondary | ICD-10-CM | POA: Diagnosis not present

## 2019-05-07 DIAGNOSIS — D631 Anemia in chronic kidney disease: Secondary | ICD-10-CM | POA: Diagnosis not present

## 2019-05-07 DIAGNOSIS — N2581 Secondary hyperparathyroidism of renal origin: Secondary | ICD-10-CM | POA: Diagnosis not present

## 2019-05-07 DIAGNOSIS — Z992 Dependence on renal dialysis: Secondary | ICD-10-CM | POA: Diagnosis not present

## 2019-05-07 DIAGNOSIS — D509 Iron deficiency anemia, unspecified: Secondary | ICD-10-CM | POA: Diagnosis not present

## 2019-05-07 DIAGNOSIS — N186 End stage renal disease: Secondary | ICD-10-CM | POA: Diagnosis not present

## 2019-05-09 DIAGNOSIS — N2581 Secondary hyperparathyroidism of renal origin: Secondary | ICD-10-CM | POA: Diagnosis not present

## 2019-05-09 DIAGNOSIS — N186 End stage renal disease: Secondary | ICD-10-CM | POA: Diagnosis not present

## 2019-05-09 DIAGNOSIS — D509 Iron deficiency anemia, unspecified: Secondary | ICD-10-CM | POA: Diagnosis not present

## 2019-05-09 DIAGNOSIS — D631 Anemia in chronic kidney disease: Secondary | ICD-10-CM | POA: Diagnosis not present

## 2019-05-09 DIAGNOSIS — Z992 Dependence on renal dialysis: Secondary | ICD-10-CM | POA: Diagnosis not present

## 2019-05-12 DIAGNOSIS — N186 End stage renal disease: Secondary | ICD-10-CM | POA: Diagnosis not present

## 2019-05-12 DIAGNOSIS — D631 Anemia in chronic kidney disease: Secondary | ICD-10-CM | POA: Diagnosis not present

## 2019-05-12 DIAGNOSIS — N2581 Secondary hyperparathyroidism of renal origin: Secondary | ICD-10-CM | POA: Diagnosis not present

## 2019-05-12 DIAGNOSIS — Z992 Dependence on renal dialysis: Secondary | ICD-10-CM | POA: Diagnosis not present

## 2019-05-12 DIAGNOSIS — D509 Iron deficiency anemia, unspecified: Secondary | ICD-10-CM | POA: Diagnosis not present

## 2019-05-14 DIAGNOSIS — Z992 Dependence on renal dialysis: Secondary | ICD-10-CM | POA: Diagnosis not present

## 2019-05-14 DIAGNOSIS — N186 End stage renal disease: Secondary | ICD-10-CM | POA: Diagnosis not present

## 2019-05-14 DIAGNOSIS — D631 Anemia in chronic kidney disease: Secondary | ICD-10-CM | POA: Diagnosis not present

## 2019-05-14 DIAGNOSIS — D509 Iron deficiency anemia, unspecified: Secondary | ICD-10-CM | POA: Diagnosis not present

## 2019-05-14 DIAGNOSIS — N2581 Secondary hyperparathyroidism of renal origin: Secondary | ICD-10-CM | POA: Diagnosis not present

## 2019-05-16 DIAGNOSIS — D631 Anemia in chronic kidney disease: Secondary | ICD-10-CM | POA: Diagnosis not present

## 2019-05-16 DIAGNOSIS — N2581 Secondary hyperparathyroidism of renal origin: Secondary | ICD-10-CM | POA: Diagnosis not present

## 2019-05-16 DIAGNOSIS — Z992 Dependence on renal dialysis: Secondary | ICD-10-CM | POA: Diagnosis not present

## 2019-05-16 DIAGNOSIS — N186 End stage renal disease: Secondary | ICD-10-CM | POA: Diagnosis not present

## 2019-05-16 DIAGNOSIS — D509 Iron deficiency anemia, unspecified: Secondary | ICD-10-CM | POA: Diagnosis not present

## 2019-05-19 DIAGNOSIS — N186 End stage renal disease: Secondary | ICD-10-CM | POA: Diagnosis not present

## 2019-05-19 DIAGNOSIS — D509 Iron deficiency anemia, unspecified: Secondary | ICD-10-CM | POA: Diagnosis not present

## 2019-05-19 DIAGNOSIS — D631 Anemia in chronic kidney disease: Secondary | ICD-10-CM | POA: Diagnosis not present

## 2019-05-19 DIAGNOSIS — N2581 Secondary hyperparathyroidism of renal origin: Secondary | ICD-10-CM | POA: Diagnosis not present

## 2019-05-19 DIAGNOSIS — Z992 Dependence on renal dialysis: Secondary | ICD-10-CM | POA: Diagnosis not present

## 2019-05-20 ENCOUNTER — Other Ambulatory Visit: Payer: Self-pay | Admitting: Obstetrics and Gynecology

## 2019-05-20 DIAGNOSIS — R928 Other abnormal and inconclusive findings on diagnostic imaging of breast: Secondary | ICD-10-CM

## 2019-05-21 DIAGNOSIS — N2581 Secondary hyperparathyroidism of renal origin: Secondary | ICD-10-CM | POA: Diagnosis not present

## 2019-05-21 DIAGNOSIS — Z992 Dependence on renal dialysis: Secondary | ICD-10-CM | POA: Diagnosis not present

## 2019-05-21 DIAGNOSIS — N186 End stage renal disease: Secondary | ICD-10-CM | POA: Diagnosis not present

## 2019-05-21 DIAGNOSIS — D631 Anemia in chronic kidney disease: Secondary | ICD-10-CM | POA: Diagnosis not present

## 2019-05-21 DIAGNOSIS — D509 Iron deficiency anemia, unspecified: Secondary | ICD-10-CM | POA: Diagnosis not present

## 2019-05-23 DIAGNOSIS — D631 Anemia in chronic kidney disease: Secondary | ICD-10-CM | POA: Diagnosis not present

## 2019-05-23 DIAGNOSIS — N186 End stage renal disease: Secondary | ICD-10-CM | POA: Diagnosis not present

## 2019-05-23 DIAGNOSIS — N2581 Secondary hyperparathyroidism of renal origin: Secondary | ICD-10-CM | POA: Diagnosis not present

## 2019-05-23 DIAGNOSIS — Z992 Dependence on renal dialysis: Secondary | ICD-10-CM | POA: Diagnosis not present

## 2019-05-23 DIAGNOSIS — D509 Iron deficiency anemia, unspecified: Secondary | ICD-10-CM | POA: Diagnosis not present

## 2019-05-26 DIAGNOSIS — N2581 Secondary hyperparathyroidism of renal origin: Secondary | ICD-10-CM | POA: Diagnosis not present

## 2019-05-26 DIAGNOSIS — N186 End stage renal disease: Secondary | ICD-10-CM | POA: Diagnosis not present

## 2019-05-26 DIAGNOSIS — Z992 Dependence on renal dialysis: Secondary | ICD-10-CM | POA: Diagnosis not present

## 2019-05-26 DIAGNOSIS — D631 Anemia in chronic kidney disease: Secondary | ICD-10-CM | POA: Diagnosis not present

## 2019-05-26 DIAGNOSIS — D509 Iron deficiency anemia, unspecified: Secondary | ICD-10-CM | POA: Diagnosis not present

## 2019-05-28 DIAGNOSIS — D509 Iron deficiency anemia, unspecified: Secondary | ICD-10-CM | POA: Diagnosis not present

## 2019-05-28 DIAGNOSIS — N2581 Secondary hyperparathyroidism of renal origin: Secondary | ICD-10-CM | POA: Diagnosis not present

## 2019-05-28 DIAGNOSIS — D631 Anemia in chronic kidney disease: Secondary | ICD-10-CM | POA: Diagnosis not present

## 2019-05-28 DIAGNOSIS — N186 End stage renal disease: Secondary | ICD-10-CM | POA: Diagnosis not present

## 2019-05-28 DIAGNOSIS — Z992 Dependence on renal dialysis: Secondary | ICD-10-CM | POA: Diagnosis not present

## 2019-05-30 DIAGNOSIS — D631 Anemia in chronic kidney disease: Secondary | ICD-10-CM | POA: Diagnosis not present

## 2019-05-30 DIAGNOSIS — Z992 Dependence on renal dialysis: Secondary | ICD-10-CM | POA: Diagnosis not present

## 2019-05-30 DIAGNOSIS — D509 Iron deficiency anemia, unspecified: Secondary | ICD-10-CM | POA: Diagnosis not present

## 2019-05-30 DIAGNOSIS — N2581 Secondary hyperparathyroidism of renal origin: Secondary | ICD-10-CM | POA: Diagnosis not present

## 2019-05-30 DIAGNOSIS — N186 End stage renal disease: Secondary | ICD-10-CM | POA: Diagnosis not present

## 2019-06-02 DIAGNOSIS — N2581 Secondary hyperparathyroidism of renal origin: Secondary | ICD-10-CM | POA: Diagnosis not present

## 2019-06-02 DIAGNOSIS — D631 Anemia in chronic kidney disease: Secondary | ICD-10-CM | POA: Diagnosis not present

## 2019-06-02 DIAGNOSIS — Z992 Dependence on renal dialysis: Secondary | ICD-10-CM | POA: Diagnosis not present

## 2019-06-02 DIAGNOSIS — D509 Iron deficiency anemia, unspecified: Secondary | ICD-10-CM | POA: Diagnosis not present

## 2019-06-02 DIAGNOSIS — N186 End stage renal disease: Secondary | ICD-10-CM | POA: Diagnosis not present

## 2019-06-04 DIAGNOSIS — D631 Anemia in chronic kidney disease: Secondary | ICD-10-CM | POA: Diagnosis not present

## 2019-06-04 DIAGNOSIS — N2581 Secondary hyperparathyroidism of renal origin: Secondary | ICD-10-CM | POA: Diagnosis not present

## 2019-06-04 DIAGNOSIS — Z992 Dependence on renal dialysis: Secondary | ICD-10-CM | POA: Diagnosis not present

## 2019-06-04 DIAGNOSIS — N186 End stage renal disease: Secondary | ICD-10-CM | POA: Diagnosis not present

## 2019-06-04 DIAGNOSIS — D509 Iron deficiency anemia, unspecified: Secondary | ICD-10-CM | POA: Diagnosis not present

## 2019-06-06 DIAGNOSIS — Z992 Dependence on renal dialysis: Secondary | ICD-10-CM | POA: Diagnosis not present

## 2019-06-06 DIAGNOSIS — N186 End stage renal disease: Secondary | ICD-10-CM | POA: Diagnosis not present

## 2019-06-06 DIAGNOSIS — N2581 Secondary hyperparathyroidism of renal origin: Secondary | ICD-10-CM | POA: Diagnosis not present

## 2019-06-06 DIAGNOSIS — D509 Iron deficiency anemia, unspecified: Secondary | ICD-10-CM | POA: Diagnosis not present

## 2019-06-06 DIAGNOSIS — D631 Anemia in chronic kidney disease: Secondary | ICD-10-CM | POA: Diagnosis not present

## 2019-06-09 DIAGNOSIS — Z992 Dependence on renal dialysis: Secondary | ICD-10-CM | POA: Diagnosis not present

## 2019-06-09 DIAGNOSIS — N2581 Secondary hyperparathyroidism of renal origin: Secondary | ICD-10-CM | POA: Diagnosis not present

## 2019-06-09 DIAGNOSIS — N186 End stage renal disease: Secondary | ICD-10-CM | POA: Diagnosis not present

## 2019-06-09 DIAGNOSIS — D631 Anemia in chronic kidney disease: Secondary | ICD-10-CM | POA: Diagnosis not present

## 2019-06-09 DIAGNOSIS — D509 Iron deficiency anemia, unspecified: Secondary | ICD-10-CM | POA: Diagnosis not present

## 2019-06-11 DIAGNOSIS — N2581 Secondary hyperparathyroidism of renal origin: Secondary | ICD-10-CM | POA: Diagnosis not present

## 2019-06-11 DIAGNOSIS — Z992 Dependence on renal dialysis: Secondary | ICD-10-CM | POA: Diagnosis not present

## 2019-06-11 DIAGNOSIS — N186 End stage renal disease: Secondary | ICD-10-CM | POA: Diagnosis not present

## 2019-06-11 DIAGNOSIS — D509 Iron deficiency anemia, unspecified: Secondary | ICD-10-CM | POA: Diagnosis not present

## 2019-06-11 DIAGNOSIS — D631 Anemia in chronic kidney disease: Secondary | ICD-10-CM | POA: Diagnosis not present

## 2019-06-13 DIAGNOSIS — Z992 Dependence on renal dialysis: Secondary | ICD-10-CM | POA: Diagnosis not present

## 2019-06-13 DIAGNOSIS — N186 End stage renal disease: Secondary | ICD-10-CM | POA: Diagnosis not present

## 2019-06-13 DIAGNOSIS — D631 Anemia in chronic kidney disease: Secondary | ICD-10-CM | POA: Diagnosis not present

## 2019-06-13 DIAGNOSIS — N2581 Secondary hyperparathyroidism of renal origin: Secondary | ICD-10-CM | POA: Diagnosis not present

## 2019-06-13 DIAGNOSIS — D509 Iron deficiency anemia, unspecified: Secondary | ICD-10-CM | POA: Diagnosis not present

## 2019-06-16 DIAGNOSIS — D631 Anemia in chronic kidney disease: Secondary | ICD-10-CM | POA: Diagnosis not present

## 2019-06-16 DIAGNOSIS — N186 End stage renal disease: Secondary | ICD-10-CM | POA: Diagnosis not present

## 2019-06-16 DIAGNOSIS — N2581 Secondary hyperparathyroidism of renal origin: Secondary | ICD-10-CM | POA: Diagnosis not present

## 2019-06-16 DIAGNOSIS — Z992 Dependence on renal dialysis: Secondary | ICD-10-CM | POA: Diagnosis not present

## 2019-06-16 DIAGNOSIS — D509 Iron deficiency anemia, unspecified: Secondary | ICD-10-CM | POA: Diagnosis not present

## 2019-06-18 DIAGNOSIS — N186 End stage renal disease: Secondary | ICD-10-CM | POA: Diagnosis not present

## 2019-06-18 DIAGNOSIS — D509 Iron deficiency anemia, unspecified: Secondary | ICD-10-CM | POA: Diagnosis not present

## 2019-06-18 DIAGNOSIS — N2581 Secondary hyperparathyroidism of renal origin: Secondary | ICD-10-CM | POA: Diagnosis not present

## 2019-06-18 DIAGNOSIS — Z992 Dependence on renal dialysis: Secondary | ICD-10-CM | POA: Diagnosis not present

## 2019-06-18 DIAGNOSIS — D631 Anemia in chronic kidney disease: Secondary | ICD-10-CM | POA: Diagnosis not present

## 2019-06-19 DIAGNOSIS — N186 End stage renal disease: Secondary | ICD-10-CM | POA: Diagnosis not present

## 2019-06-19 DIAGNOSIS — Z992 Dependence on renal dialysis: Secondary | ICD-10-CM | POA: Diagnosis not present

## 2019-06-20 DIAGNOSIS — D509 Iron deficiency anemia, unspecified: Secondary | ICD-10-CM | POA: Diagnosis not present

## 2019-06-20 DIAGNOSIS — Z992 Dependence on renal dialysis: Secondary | ICD-10-CM | POA: Diagnosis not present

## 2019-06-20 DIAGNOSIS — N186 End stage renal disease: Secondary | ICD-10-CM | POA: Diagnosis not present

## 2019-06-20 DIAGNOSIS — D631 Anemia in chronic kidney disease: Secondary | ICD-10-CM | POA: Diagnosis not present

## 2019-06-20 DIAGNOSIS — N2581 Secondary hyperparathyroidism of renal origin: Secondary | ICD-10-CM | POA: Diagnosis not present

## 2019-06-23 DIAGNOSIS — N2581 Secondary hyperparathyroidism of renal origin: Secondary | ICD-10-CM | POA: Diagnosis not present

## 2019-06-23 DIAGNOSIS — N186 End stage renal disease: Secondary | ICD-10-CM | POA: Diagnosis not present

## 2019-06-23 DIAGNOSIS — D509 Iron deficiency anemia, unspecified: Secondary | ICD-10-CM | POA: Diagnosis not present

## 2019-06-23 DIAGNOSIS — D631 Anemia in chronic kidney disease: Secondary | ICD-10-CM | POA: Diagnosis not present

## 2019-06-23 DIAGNOSIS — Z992 Dependence on renal dialysis: Secondary | ICD-10-CM | POA: Diagnosis not present

## 2019-06-25 ENCOUNTER — Other Ambulatory Visit: Payer: Self-pay | Admitting: Cardiovascular Disease

## 2019-06-25 DIAGNOSIS — N186 End stage renal disease: Secondary | ICD-10-CM | POA: Diagnosis not present

## 2019-06-25 DIAGNOSIS — N2581 Secondary hyperparathyroidism of renal origin: Secondary | ICD-10-CM | POA: Diagnosis not present

## 2019-06-25 DIAGNOSIS — Z992 Dependence on renal dialysis: Secondary | ICD-10-CM | POA: Diagnosis not present

## 2019-06-25 DIAGNOSIS — D631 Anemia in chronic kidney disease: Secondary | ICD-10-CM | POA: Diagnosis not present

## 2019-06-25 DIAGNOSIS — D509 Iron deficiency anemia, unspecified: Secondary | ICD-10-CM | POA: Diagnosis not present

## 2019-06-27 DIAGNOSIS — N2581 Secondary hyperparathyroidism of renal origin: Secondary | ICD-10-CM | POA: Diagnosis not present

## 2019-06-27 DIAGNOSIS — D631 Anemia in chronic kidney disease: Secondary | ICD-10-CM | POA: Diagnosis not present

## 2019-06-27 DIAGNOSIS — D509 Iron deficiency anemia, unspecified: Secondary | ICD-10-CM | POA: Diagnosis not present

## 2019-06-27 DIAGNOSIS — Z992 Dependence on renal dialysis: Secondary | ICD-10-CM | POA: Diagnosis not present

## 2019-06-27 DIAGNOSIS — N186 End stage renal disease: Secondary | ICD-10-CM | POA: Diagnosis not present

## 2019-06-30 DIAGNOSIS — D631 Anemia in chronic kidney disease: Secondary | ICD-10-CM | POA: Diagnosis not present

## 2019-06-30 DIAGNOSIS — Z992 Dependence on renal dialysis: Secondary | ICD-10-CM | POA: Diagnosis not present

## 2019-06-30 DIAGNOSIS — N2581 Secondary hyperparathyroidism of renal origin: Secondary | ICD-10-CM | POA: Diagnosis not present

## 2019-06-30 DIAGNOSIS — N186 End stage renal disease: Secondary | ICD-10-CM | POA: Diagnosis not present

## 2019-06-30 DIAGNOSIS — D509 Iron deficiency anemia, unspecified: Secondary | ICD-10-CM | POA: Diagnosis not present

## 2019-07-02 DIAGNOSIS — N2581 Secondary hyperparathyroidism of renal origin: Secondary | ICD-10-CM | POA: Diagnosis not present

## 2019-07-02 DIAGNOSIS — D509 Iron deficiency anemia, unspecified: Secondary | ICD-10-CM | POA: Diagnosis not present

## 2019-07-02 DIAGNOSIS — N186 End stage renal disease: Secondary | ICD-10-CM | POA: Diagnosis not present

## 2019-07-02 DIAGNOSIS — Z992 Dependence on renal dialysis: Secondary | ICD-10-CM | POA: Diagnosis not present

## 2019-07-02 DIAGNOSIS — D631 Anemia in chronic kidney disease: Secondary | ICD-10-CM | POA: Diagnosis not present

## 2019-07-04 DIAGNOSIS — N2581 Secondary hyperparathyroidism of renal origin: Secondary | ICD-10-CM | POA: Diagnosis not present

## 2019-07-04 DIAGNOSIS — D509 Iron deficiency anemia, unspecified: Secondary | ICD-10-CM | POA: Diagnosis not present

## 2019-07-04 DIAGNOSIS — N186 End stage renal disease: Secondary | ICD-10-CM | POA: Diagnosis not present

## 2019-07-04 DIAGNOSIS — Z992 Dependence on renal dialysis: Secondary | ICD-10-CM | POA: Diagnosis not present

## 2019-07-04 DIAGNOSIS — D631 Anemia in chronic kidney disease: Secondary | ICD-10-CM | POA: Diagnosis not present

## 2019-07-07 DIAGNOSIS — N186 End stage renal disease: Secondary | ICD-10-CM | POA: Diagnosis not present

## 2019-07-07 DIAGNOSIS — Z992 Dependence on renal dialysis: Secondary | ICD-10-CM | POA: Diagnosis not present

## 2019-07-07 DIAGNOSIS — D631 Anemia in chronic kidney disease: Secondary | ICD-10-CM | POA: Diagnosis not present

## 2019-07-07 DIAGNOSIS — D509 Iron deficiency anemia, unspecified: Secondary | ICD-10-CM | POA: Diagnosis not present

## 2019-07-07 DIAGNOSIS — N2581 Secondary hyperparathyroidism of renal origin: Secondary | ICD-10-CM | POA: Diagnosis not present

## 2019-07-08 DIAGNOSIS — Z992 Dependence on renal dialysis: Secondary | ICD-10-CM | POA: Diagnosis not present

## 2019-07-08 DIAGNOSIS — N2581 Secondary hyperparathyroidism of renal origin: Secondary | ICD-10-CM | POA: Diagnosis not present

## 2019-07-08 DIAGNOSIS — F419 Anxiety disorder, unspecified: Secondary | ICD-10-CM | POA: Diagnosis not present

## 2019-07-08 DIAGNOSIS — D696 Thrombocytopenia, unspecified: Secondary | ICD-10-CM | POA: Diagnosis not present

## 2019-07-08 DIAGNOSIS — M329 Systemic lupus erythematosus, unspecified: Secondary | ICD-10-CM | POA: Diagnosis not present

## 2019-07-08 DIAGNOSIS — F329 Major depressive disorder, single episode, unspecified: Secondary | ICD-10-CM | POA: Diagnosis not present

## 2019-07-08 DIAGNOSIS — N186 End stage renal disease: Secondary | ICD-10-CM | POA: Diagnosis not present

## 2019-07-09 DIAGNOSIS — Z992 Dependence on renal dialysis: Secondary | ICD-10-CM | POA: Diagnosis not present

## 2019-07-09 DIAGNOSIS — D631 Anemia in chronic kidney disease: Secondary | ICD-10-CM | POA: Diagnosis not present

## 2019-07-09 DIAGNOSIS — N2581 Secondary hyperparathyroidism of renal origin: Secondary | ICD-10-CM | POA: Diagnosis not present

## 2019-07-09 DIAGNOSIS — N186 End stage renal disease: Secondary | ICD-10-CM | POA: Diagnosis not present

## 2019-07-09 DIAGNOSIS — D509 Iron deficiency anemia, unspecified: Secondary | ICD-10-CM | POA: Diagnosis not present

## 2019-07-11 DIAGNOSIS — N2581 Secondary hyperparathyroidism of renal origin: Secondary | ICD-10-CM | POA: Diagnosis not present

## 2019-07-11 DIAGNOSIS — Z992 Dependence on renal dialysis: Secondary | ICD-10-CM | POA: Diagnosis not present

## 2019-07-11 DIAGNOSIS — D631 Anemia in chronic kidney disease: Secondary | ICD-10-CM | POA: Diagnosis not present

## 2019-07-11 DIAGNOSIS — D509 Iron deficiency anemia, unspecified: Secondary | ICD-10-CM | POA: Diagnosis not present

## 2019-07-11 DIAGNOSIS — N186 End stage renal disease: Secondary | ICD-10-CM | POA: Diagnosis not present

## 2019-07-14 DIAGNOSIS — N2581 Secondary hyperparathyroidism of renal origin: Secondary | ICD-10-CM | POA: Diagnosis not present

## 2019-07-14 DIAGNOSIS — Z992 Dependence on renal dialysis: Secondary | ICD-10-CM | POA: Diagnosis not present

## 2019-07-14 DIAGNOSIS — N186 End stage renal disease: Secondary | ICD-10-CM | POA: Diagnosis not present

## 2019-07-14 DIAGNOSIS — D631 Anemia in chronic kidney disease: Secondary | ICD-10-CM | POA: Diagnosis not present

## 2019-07-14 DIAGNOSIS — D509 Iron deficiency anemia, unspecified: Secondary | ICD-10-CM | POA: Diagnosis not present

## 2019-07-16 DIAGNOSIS — D631 Anemia in chronic kidney disease: Secondary | ICD-10-CM | POA: Diagnosis not present

## 2019-07-16 DIAGNOSIS — N186 End stage renal disease: Secondary | ICD-10-CM | POA: Diagnosis not present

## 2019-07-16 DIAGNOSIS — Z992 Dependence on renal dialysis: Secondary | ICD-10-CM | POA: Diagnosis not present

## 2019-07-16 DIAGNOSIS — N2581 Secondary hyperparathyroidism of renal origin: Secondary | ICD-10-CM | POA: Diagnosis not present

## 2019-07-16 DIAGNOSIS — D509 Iron deficiency anemia, unspecified: Secondary | ICD-10-CM | POA: Diagnosis not present

## 2019-07-18 DIAGNOSIS — Z992 Dependence on renal dialysis: Secondary | ICD-10-CM | POA: Diagnosis not present

## 2019-07-18 DIAGNOSIS — D509 Iron deficiency anemia, unspecified: Secondary | ICD-10-CM | POA: Diagnosis not present

## 2019-07-18 DIAGNOSIS — N2581 Secondary hyperparathyroidism of renal origin: Secondary | ICD-10-CM | POA: Diagnosis not present

## 2019-07-18 DIAGNOSIS — N186 End stage renal disease: Secondary | ICD-10-CM | POA: Diagnosis not present

## 2019-07-18 DIAGNOSIS — D631 Anemia in chronic kidney disease: Secondary | ICD-10-CM | POA: Diagnosis not present

## 2019-07-20 DIAGNOSIS — Z992 Dependence on renal dialysis: Secondary | ICD-10-CM | POA: Diagnosis not present

## 2019-07-20 DIAGNOSIS — N186 End stage renal disease: Secondary | ICD-10-CM | POA: Diagnosis not present

## 2019-07-21 DIAGNOSIS — Z992 Dependence on renal dialysis: Secondary | ICD-10-CM | POA: Diagnosis not present

## 2019-07-21 DIAGNOSIS — N2581 Secondary hyperparathyroidism of renal origin: Secondary | ICD-10-CM | POA: Diagnosis not present

## 2019-07-21 DIAGNOSIS — D509 Iron deficiency anemia, unspecified: Secondary | ICD-10-CM | POA: Diagnosis not present

## 2019-07-21 DIAGNOSIS — D631 Anemia in chronic kidney disease: Secondary | ICD-10-CM | POA: Diagnosis not present

## 2019-07-21 DIAGNOSIS — N186 End stage renal disease: Secondary | ICD-10-CM | POA: Diagnosis not present

## 2019-07-23 DIAGNOSIS — D631 Anemia in chronic kidney disease: Secondary | ICD-10-CM | POA: Diagnosis not present

## 2019-07-23 DIAGNOSIS — N186 End stage renal disease: Secondary | ICD-10-CM | POA: Diagnosis not present

## 2019-07-23 DIAGNOSIS — Z992 Dependence on renal dialysis: Secondary | ICD-10-CM | POA: Diagnosis not present

## 2019-07-23 DIAGNOSIS — D509 Iron deficiency anemia, unspecified: Secondary | ICD-10-CM | POA: Diagnosis not present

## 2019-07-23 DIAGNOSIS — N2581 Secondary hyperparathyroidism of renal origin: Secondary | ICD-10-CM | POA: Diagnosis not present

## 2019-07-25 DIAGNOSIS — N186 End stage renal disease: Secondary | ICD-10-CM | POA: Diagnosis not present

## 2019-07-25 DIAGNOSIS — Z992 Dependence on renal dialysis: Secondary | ICD-10-CM | POA: Diagnosis not present

## 2019-07-25 DIAGNOSIS — D631 Anemia in chronic kidney disease: Secondary | ICD-10-CM | POA: Diagnosis not present

## 2019-07-25 DIAGNOSIS — N2581 Secondary hyperparathyroidism of renal origin: Secondary | ICD-10-CM | POA: Diagnosis not present

## 2019-07-25 DIAGNOSIS — D509 Iron deficiency anemia, unspecified: Secondary | ICD-10-CM | POA: Diagnosis not present

## 2019-07-28 DIAGNOSIS — N2581 Secondary hyperparathyroidism of renal origin: Secondary | ICD-10-CM | POA: Diagnosis not present

## 2019-07-28 DIAGNOSIS — Z992 Dependence on renal dialysis: Secondary | ICD-10-CM | POA: Diagnosis not present

## 2019-07-28 DIAGNOSIS — D509 Iron deficiency anemia, unspecified: Secondary | ICD-10-CM | POA: Diagnosis not present

## 2019-07-28 DIAGNOSIS — N186 End stage renal disease: Secondary | ICD-10-CM | POA: Diagnosis not present

## 2019-07-28 DIAGNOSIS — D631 Anemia in chronic kidney disease: Secondary | ICD-10-CM | POA: Diagnosis not present

## 2019-07-30 DIAGNOSIS — Z992 Dependence on renal dialysis: Secondary | ICD-10-CM | POA: Diagnosis not present

## 2019-07-30 DIAGNOSIS — N2581 Secondary hyperparathyroidism of renal origin: Secondary | ICD-10-CM | POA: Diagnosis not present

## 2019-07-30 DIAGNOSIS — D509 Iron deficiency anemia, unspecified: Secondary | ICD-10-CM | POA: Diagnosis not present

## 2019-07-30 DIAGNOSIS — D631 Anemia in chronic kidney disease: Secondary | ICD-10-CM | POA: Diagnosis not present

## 2019-07-30 DIAGNOSIS — N186 End stage renal disease: Secondary | ICD-10-CM | POA: Diagnosis not present

## 2019-08-01 DIAGNOSIS — D509 Iron deficiency anemia, unspecified: Secondary | ICD-10-CM | POA: Diagnosis not present

## 2019-08-01 DIAGNOSIS — D631 Anemia in chronic kidney disease: Secondary | ICD-10-CM | POA: Diagnosis not present

## 2019-08-01 DIAGNOSIS — Z992 Dependence on renal dialysis: Secondary | ICD-10-CM | POA: Diagnosis not present

## 2019-08-01 DIAGNOSIS — N186 End stage renal disease: Secondary | ICD-10-CM | POA: Diagnosis not present

## 2019-08-01 DIAGNOSIS — N2581 Secondary hyperparathyroidism of renal origin: Secondary | ICD-10-CM | POA: Diagnosis not present

## 2019-08-04 DIAGNOSIS — N186 End stage renal disease: Secondary | ICD-10-CM | POA: Diagnosis not present

## 2019-08-04 DIAGNOSIS — D631 Anemia in chronic kidney disease: Secondary | ICD-10-CM | POA: Diagnosis not present

## 2019-08-04 DIAGNOSIS — Z992 Dependence on renal dialysis: Secondary | ICD-10-CM | POA: Diagnosis not present

## 2019-08-04 DIAGNOSIS — D509 Iron deficiency anemia, unspecified: Secondary | ICD-10-CM | POA: Diagnosis not present

## 2019-08-04 DIAGNOSIS — N2581 Secondary hyperparathyroidism of renal origin: Secondary | ICD-10-CM | POA: Diagnosis not present

## 2019-08-06 DIAGNOSIS — D631 Anemia in chronic kidney disease: Secondary | ICD-10-CM | POA: Diagnosis not present

## 2019-08-06 DIAGNOSIS — N2581 Secondary hyperparathyroidism of renal origin: Secondary | ICD-10-CM | POA: Diagnosis not present

## 2019-08-06 DIAGNOSIS — D509 Iron deficiency anemia, unspecified: Secondary | ICD-10-CM | POA: Diagnosis not present

## 2019-08-06 DIAGNOSIS — Z992 Dependence on renal dialysis: Secondary | ICD-10-CM | POA: Diagnosis not present

## 2019-08-06 DIAGNOSIS — N186 End stage renal disease: Secondary | ICD-10-CM | POA: Diagnosis not present

## 2019-08-08 DIAGNOSIS — D509 Iron deficiency anemia, unspecified: Secondary | ICD-10-CM | POA: Diagnosis not present

## 2019-08-08 DIAGNOSIS — D631 Anemia in chronic kidney disease: Secondary | ICD-10-CM | POA: Diagnosis not present

## 2019-08-08 DIAGNOSIS — Z992 Dependence on renal dialysis: Secondary | ICD-10-CM | POA: Diagnosis not present

## 2019-08-08 DIAGNOSIS — N186 End stage renal disease: Secondary | ICD-10-CM | POA: Diagnosis not present

## 2019-08-08 DIAGNOSIS — N2581 Secondary hyperparathyroidism of renal origin: Secondary | ICD-10-CM | POA: Diagnosis not present

## 2019-08-11 DIAGNOSIS — N186 End stage renal disease: Secondary | ICD-10-CM | POA: Diagnosis not present

## 2019-08-11 DIAGNOSIS — D509 Iron deficiency anemia, unspecified: Secondary | ICD-10-CM | POA: Diagnosis not present

## 2019-08-11 DIAGNOSIS — Z992 Dependence on renal dialysis: Secondary | ICD-10-CM | POA: Diagnosis not present

## 2019-08-11 DIAGNOSIS — D631 Anemia in chronic kidney disease: Secondary | ICD-10-CM | POA: Diagnosis not present

## 2019-08-11 DIAGNOSIS — N2581 Secondary hyperparathyroidism of renal origin: Secondary | ICD-10-CM | POA: Diagnosis not present

## 2019-08-13 DIAGNOSIS — D631 Anemia in chronic kidney disease: Secondary | ICD-10-CM | POA: Diagnosis not present

## 2019-08-13 DIAGNOSIS — Z992 Dependence on renal dialysis: Secondary | ICD-10-CM | POA: Diagnosis not present

## 2019-08-13 DIAGNOSIS — N2581 Secondary hyperparathyroidism of renal origin: Secondary | ICD-10-CM | POA: Diagnosis not present

## 2019-08-13 DIAGNOSIS — N186 End stage renal disease: Secondary | ICD-10-CM | POA: Diagnosis not present

## 2019-08-13 DIAGNOSIS — D509 Iron deficiency anemia, unspecified: Secondary | ICD-10-CM | POA: Diagnosis not present

## 2019-08-15 DIAGNOSIS — D631 Anemia in chronic kidney disease: Secondary | ICD-10-CM | POA: Diagnosis not present

## 2019-08-15 DIAGNOSIS — D509 Iron deficiency anemia, unspecified: Secondary | ICD-10-CM | POA: Diagnosis not present

## 2019-08-15 DIAGNOSIS — N186 End stage renal disease: Secondary | ICD-10-CM | POA: Diagnosis not present

## 2019-08-15 DIAGNOSIS — Z992 Dependence on renal dialysis: Secondary | ICD-10-CM | POA: Diagnosis not present

## 2019-08-15 DIAGNOSIS — N2581 Secondary hyperparathyroidism of renal origin: Secondary | ICD-10-CM | POA: Diagnosis not present

## 2019-08-18 DIAGNOSIS — D509 Iron deficiency anemia, unspecified: Secondary | ICD-10-CM | POA: Diagnosis not present

## 2019-08-18 DIAGNOSIS — D631 Anemia in chronic kidney disease: Secondary | ICD-10-CM | POA: Diagnosis not present

## 2019-08-18 DIAGNOSIS — N2581 Secondary hyperparathyroidism of renal origin: Secondary | ICD-10-CM | POA: Diagnosis not present

## 2019-08-18 DIAGNOSIS — N186 End stage renal disease: Secondary | ICD-10-CM | POA: Diagnosis not present

## 2019-08-18 DIAGNOSIS — Z992 Dependence on renal dialysis: Secondary | ICD-10-CM | POA: Diagnosis not present

## 2019-08-19 DIAGNOSIS — Z23 Encounter for immunization: Secondary | ICD-10-CM | POA: Diagnosis not present

## 2019-08-19 DIAGNOSIS — F329 Major depressive disorder, single episode, unspecified: Secondary | ICD-10-CM | POA: Diagnosis not present

## 2019-08-19 DIAGNOSIS — Z992 Dependence on renal dialysis: Secondary | ICD-10-CM | POA: Diagnosis not present

## 2019-08-19 DIAGNOSIS — N186 End stage renal disease: Secondary | ICD-10-CM | POA: Diagnosis not present

## 2019-08-19 DIAGNOSIS — F419 Anxiety disorder, unspecified: Secondary | ICD-10-CM | POA: Diagnosis not present

## 2019-08-20 DIAGNOSIS — Z992 Dependence on renal dialysis: Secondary | ICD-10-CM | POA: Diagnosis not present

## 2019-08-20 DIAGNOSIS — N186 End stage renal disease: Secondary | ICD-10-CM | POA: Diagnosis not present

## 2019-08-20 DIAGNOSIS — D631 Anemia in chronic kidney disease: Secondary | ICD-10-CM | POA: Diagnosis not present

## 2019-08-20 DIAGNOSIS — D509 Iron deficiency anemia, unspecified: Secondary | ICD-10-CM | POA: Diagnosis not present

## 2019-08-20 DIAGNOSIS — N2581 Secondary hyperparathyroidism of renal origin: Secondary | ICD-10-CM | POA: Diagnosis not present

## 2019-08-22 DIAGNOSIS — Z992 Dependence on renal dialysis: Secondary | ICD-10-CM | POA: Diagnosis not present

## 2019-08-22 DIAGNOSIS — N186 End stage renal disease: Secondary | ICD-10-CM | POA: Diagnosis not present

## 2019-08-22 DIAGNOSIS — D509 Iron deficiency anemia, unspecified: Secondary | ICD-10-CM | POA: Diagnosis not present

## 2019-08-22 DIAGNOSIS — N2581 Secondary hyperparathyroidism of renal origin: Secondary | ICD-10-CM | POA: Diagnosis not present

## 2019-08-22 DIAGNOSIS — D631 Anemia in chronic kidney disease: Secondary | ICD-10-CM | POA: Diagnosis not present

## 2019-08-25 DIAGNOSIS — D631 Anemia in chronic kidney disease: Secondary | ICD-10-CM | POA: Diagnosis not present

## 2019-08-25 DIAGNOSIS — N2581 Secondary hyperparathyroidism of renal origin: Secondary | ICD-10-CM | POA: Diagnosis not present

## 2019-08-25 DIAGNOSIS — D509 Iron deficiency anemia, unspecified: Secondary | ICD-10-CM | POA: Diagnosis not present

## 2019-08-25 DIAGNOSIS — N186 End stage renal disease: Secondary | ICD-10-CM | POA: Diagnosis not present

## 2019-08-25 DIAGNOSIS — Z992 Dependence on renal dialysis: Secondary | ICD-10-CM | POA: Diagnosis not present

## 2019-08-27 DIAGNOSIS — N2581 Secondary hyperparathyroidism of renal origin: Secondary | ICD-10-CM | POA: Diagnosis not present

## 2019-08-27 DIAGNOSIS — N186 End stage renal disease: Secondary | ICD-10-CM | POA: Diagnosis not present

## 2019-08-27 DIAGNOSIS — Z992 Dependence on renal dialysis: Secondary | ICD-10-CM | POA: Diagnosis not present

## 2019-08-27 DIAGNOSIS — D631 Anemia in chronic kidney disease: Secondary | ICD-10-CM | POA: Diagnosis not present

## 2019-08-27 DIAGNOSIS — D509 Iron deficiency anemia, unspecified: Secondary | ICD-10-CM | POA: Diagnosis not present

## 2019-08-29 DIAGNOSIS — N186 End stage renal disease: Secondary | ICD-10-CM | POA: Diagnosis not present

## 2019-08-29 DIAGNOSIS — N2581 Secondary hyperparathyroidism of renal origin: Secondary | ICD-10-CM | POA: Diagnosis not present

## 2019-08-29 DIAGNOSIS — D631 Anemia in chronic kidney disease: Secondary | ICD-10-CM | POA: Diagnosis not present

## 2019-08-29 DIAGNOSIS — D509 Iron deficiency anemia, unspecified: Secondary | ICD-10-CM | POA: Diagnosis not present

## 2019-08-29 DIAGNOSIS — Z992 Dependence on renal dialysis: Secondary | ICD-10-CM | POA: Diagnosis not present

## 2019-08-31 ENCOUNTER — Telehealth: Payer: Self-pay | Admitting: Cardiovascular Disease

## 2019-08-31 NOTE — Telephone Encounter (Signed)
Virtual Visit Pre-Appointment Phone Call  "(Name), I am calling you today to discuss your upcoming appointment. We are currently trying to limit exposure to the virus that causes COVID-19 by seeing patients at home rather than in the office."  1. "What is the BEST phone number to call the day of the visit?" - include this in appointment notes  2. Do you have or have access to (through a family member/friend) a smartphone with video capability that we can use for your visit?" a. If yes - list this number in appt notes as cell (if different from BEST phone #) and list the appointment type as a VIDEO visit in appointment notes b. If no - list the appointment type as a PHONE visit in appointment notes  3. Confirm consent - "In the setting of the current Covid19 crisis, you are scheduled for a (phone or video) visit with your provider on (date) at (time).  Just as we do with many in-office visits, in order for you to participate in this visit, we must obtain consent.  If you'd like, I can send this to your mychart (if signed up) or email for you to review.  Otherwise, I can obtain your verbal consent now.  All virtual visits are billed to your insurance company just like a normal visit would be.  By agreeing to a virtual visit, we'd like you to understand that the technology does not allow for your provider to perform an examination, and thus may limit your provider's ability to fully assess your condition. If your provider identifies any concerns that need to be evaluated in person, we will make arrangements to do so.  Finally, though the technology is pretty good, we cannot assure that it will always work on either your or our end, and in the setting of a video visit, we may have to convert it to a phone-only visit.  In either situation, we cannot ensure that we have a secure connection.  Are you willing to proceed?" STAFF: Did the patient verbally acknowledge consent to telehealth visit? Document  YES/NO here: yes  4. Advise patient to be prepared - "Two hours prior to your appointment, go ahead and check your blood pressure, pulse, oxygen saturation, and your weight (if you have the equipment to check those) and write them all down. When your visit starts, your provider will ask you for this information. If you have an Apple Watch or Kardia device, please plan to have heart rate information ready on the day of your appointment. Please have a pen and paper handy nearby the day of the visit as well."  5. Give patient instructions for MyChart download to smartphone OR [email or web address] as below if video visit (depending on what platform provider is using)  6. Inform patient they will receive a phone call 15 minutes prior to their appointment time (may be from unknown caller ID) so they should be prepared to answer    [place] has been deemed a candidate for a follow-up tele-health visit to limit community exposure during the Covid-19 pandemic. I spoke with the patient via phone to ensure availability of phone/video source, confirm preferred email & phone number, and discuss instructions and expectations.  I reminded [name] to be prepared with any vital sign and/or heart rhythm information that could potentially be obtained via home monitoring, at the time of her visit. I reminded [name] to expect a phone call prior to  her visit.  [name] 08/31/2019 2:00 PM   INSTRUCTIONS FOR DOWNLOADING THE MYCHART APP TO SMARTPHONE  - The patient must first make sure to have activated MyChart and know their login information - If Apple, go to [employer] and type in MyChart in the search bar and download the app. If Android, ask patient to go to [employer] and type in [place] in the search bar and download the app. The app is free but as with any other app downloads, their phone may require them to verify saved payment information or  Apple/Android password.  - The patient will need to then log into the app with their MyChart username and password, and select [place] as their healthcare provider to link the account. When it is time for your visit, go to the MyChart app, find appointments, and click Begin Video Visit. Be sure to Select Allow for your device to access the Microphone and Camera for your visit. You will then be connected, and your provider will be with you shortly.  **If they have any issues connecting, or need assistance please contact MyChart service desk [phone])**  **If using a computer, in order to ensure the best quality for their visit they will need to use either of the following Internet Browsers: [employer], or Google Chrome**  IF USING DOXIMITY or DOXY.ME - The patient will receive a link just prior to their visit by text.     FULL LENGTH CONSENT FOR TELE-HEALTH VISIT   I hereby voluntarily request, consent and authorize [place] and its employed or contracted physicians, physician assistants, nurse practitioners or other licensed health care professionals (the Practitioner), to provide me with telemedicine health care services (the Services") as deemed necessary by the treating Practitioner. I acknowledge and consent to receive the Services by the Practitioner via telemedicine. I understand that the telemedicine visit will involve communicating with the Practitioner through live audiovisual communication technology and the disclosure of certain medical information by electronic transmission. I acknowledge that I have been given the opportunity to request an in-person assessment or other available alternative prior to the telemedicine visit and am voluntarily participating in the telemedicine visit.  I understand that I have the right to withhold or withdraw my consent to the use of telemedicine in the course of my care at any time, without affecting my right to future care  or treatment, and that the Practitioner or I may terminate the telemedicine visit at any time. I understand that I have the right to inspect all information obtained and/or recorded in the course of the telemedicine visit and may receive copies of available information for a reasonable fee.  I understand that some of the potential risks of receiving the Services via telemedicine include:   Delay or interruption in medical evaluation due to technological equipment failure or disruption;  Information transmitted may not be sufficient (e.g. poor resolution of images) to allow for appropriate medical decision making by the Practitioner; and/or   In rare instances, security protocols could fail, causing a breach of personal health information.  Furthermore, I acknowledge that it is my responsibility to provide information about my medical history, conditions and care that is complete and accurate to the best of my ability. I acknowledge that Practitioner's advice, recommendations, and/or decision may be based on factors not within their control, such as incomplete or inaccurate data provided by me or distortions of diagnostic images or specimens that may result from electronic transmissions. I  understand that the practice of medicine is not an exact science and that Practitioner makes no warranties or guarantees regarding treatment outcomes. I acknowledge that I will receive a copy of this consent concurrently upon execution via email to the email address I last provided but may also request a printed copy by calling the office of [place].    I understand that my insurance will be billed for this visit.   I have read or had this consent read to me.  I understand the contents of this consent, which adequately explains the benefits and risks of the Services being provided via telemedicine.   I have been provided ample opportunity to ask questions regarding this consent and the Services and have had  my questions answered to my satisfaction.  I give my informed consent for the services to be provided through the use of telemedicine in my medical care  By participating in this telemedicine visit I agree to the above.

## 2019-09-01 DIAGNOSIS — N186 End stage renal disease: Secondary | ICD-10-CM | POA: Diagnosis not present

## 2019-09-01 DIAGNOSIS — N2581 Secondary hyperparathyroidism of renal origin: Secondary | ICD-10-CM | POA: Diagnosis not present

## 2019-09-01 DIAGNOSIS — D631 Anemia in chronic kidney disease: Secondary | ICD-10-CM | POA: Diagnosis not present

## 2019-09-01 DIAGNOSIS — Z992 Dependence on renal dialysis: Secondary | ICD-10-CM | POA: Diagnosis not present

## 2019-09-01 DIAGNOSIS — D509 Iron deficiency anemia, unspecified: Secondary | ICD-10-CM | POA: Diagnosis not present

## 2019-09-02 ENCOUNTER — Telehealth (INDEPENDENT_AMBULATORY_CARE_PROVIDER_SITE_OTHER): Payer: Medicare Other | Admitting: Cardiovascular Disease

## 2019-09-02 ENCOUNTER — Encounter: Payer: Self-pay | Admitting: Cardiovascular Disease

## 2019-09-02 VITALS — BP 139/88 | HR 83 | Ht 64.0 in | Wt 135.0 lb

## 2019-09-02 DIAGNOSIS — I119 Hypertensive heart disease without heart failure: Secondary | ICD-10-CM

## 2019-09-02 DIAGNOSIS — R002 Palpitations: Secondary | ICD-10-CM

## 2019-09-02 DIAGNOSIS — I059 Rheumatic mitral valve disease, unspecified: Secondary | ICD-10-CM

## 2019-09-02 DIAGNOSIS — I10 Essential (primary) hypertension: Secondary | ICD-10-CM

## 2019-09-02 NOTE — Progress Notes (Signed)
Virtual Visit via Telephone Note   This visit type was conducted due to national recommendations for restrictions regarding the COVID-19 Pandemic (e.g. social distancing) in an effort to limit this patient's exposure and mitigate transmission in our community.  Due to her co-morbid illnesses, this patient is at least at moderate risk for complications without adequate follow up.  This format is felt to be most appropriate for this patient at this time.  The patient did not have access to video technology/had technical difficulties with video requiring transitioning to audio format only (telephone).  All issues noted in this document were discussed and addressed.  No physical exam could be performed with this format.  Please refer to the patient's chart for her  consent to telehealth for [facility].   Date:  09/02/2019   ID:  [name], DOB 1976/05/04, MRN [id number]  Patient Location: Home Provider Location: Office  PCP:  Loman Brooklyn, FNP  Cardiologist:  Kate Sable, MD  Electrophysiologist:  None   Evaluation Performed:  Follow-Up Visit  Chief Complaint:  Palpitations  History of Present Illness:    [name] is a 43 y.o. female with palpitations.  Past medical history includes systemic lupus with proptosis and end-stage renal disease for which she is on hemodialysis.  She denies chest pain, shortness of breath, and palpitations.  Systolic blood pressures have been in the 130 range.  She is feeling very well.  The patient does not have symptoms concerning for COVID-19 infection (fever, chills, cough, or new shortness of breath).    Past Medical History:  Diagnosis Date  . Allergy   . Anemia   . Arthritis   . Avascular necrosis of bone (HCC)    left tibial talus due to chronic prednisone use  . ESRD (end stage renal disease) on dialysis ([place])    tthsat [name]  . GERD (gastroesophageal reflux disease)   . Headache(784.0)   .  Hyperlipemia   . Hypertension   . Lupus ([place])   . Secondary hyperparathyroidism (of renal origin)    Past Surgical History:  Procedure Laterality Date  . A/V FISTULAGRAM Left 12/02/2017   Procedure: A/V FISTULAGRAM - Left upper;  Surgeon: Angelia Mould, MD;  Location: [place] [facility];  Service: Cardiovascular;  Laterality: Left;  . AV FISTULA PLACEMENT Left   . AV FISTULA PLACEMENT Right 01/26/2014   Procedure: ARTERIOVENOUS (AV) FISTULA CREATION; ultrasound guided;  Surgeon: Conrad Corona, MD;  Location: [place];  Service: Vascular;  Laterality: Right;  . ESOPHAGOGASTRODUODENOSCOPY N/A 03/17/2016   Procedure: ESOPHAGOGASTRODUODENOSCOPY (EGD);  Surgeon: Irene Shipper, MD;  Location: [facility] ENDOSCOPY;  Service: Endoscopy;  Laterality: N/A;  . HEMATOMA EVACUATION Left 10/09/2013   Procedure: EVACUATION HEMATOMA;  Surgeon: Angelia Mould, MD;  Location: [place];  Service: Vascular;  Laterality: Left;  . INCISION AND DRAINAGE ABSCESS     gluteal  . INSERTION OF DIALYSIS CATHETER N/A 10/09/2013   Procedure: INSERTION OF DIALYSIS CATHETER; ULTRASOUND GUIDED;  Surgeon: Angelia Mould, MD;  Location: [place];  Service: Vascular;  Laterality: N/A;  . PERIPHERAL VASCULAR BALLOON ANGIOPLASTY Left 12/02/2017   Procedure: PERIPHERAL VASCULAR BALLOON ANGIOPLASTY;  Surgeon: Angelia Mould, MD;  Location: [place] [facility];  Service: Cardiovascular;  Laterality: Left;  . REVISION OF ARTERIOVENOUS GORETEX GRAFT Left 04/03/2019   Procedure: REVISION OF ARTERIOVENOUS GORETEX GRAFT PSEUDOANEURYSM;  Surgeon: Angelia Mould, MD;  Location: [place];  Service: Vascular;  Laterality: Left;  .  SHUNTOGRAM N/A 01/07/2014   Procedure: fistulogram with possibe venoplasty left upper arm avg;  Surgeon: Angelia Mould, MD;  Location: [facility] CATH LAB;  Service: Cardiovascular;  Laterality: N/A;  . tibiocalcaneal fusion  left Left      Current Meds  Medication Sig  . acetaminophen (TYLENOL) 500  MG tablet Take 500 mg by mouth daily as needed for headache.   . calcium acetate, Phos Binder, (PHOSLYRA) 667 MG/5ML SOLN Take 667 mg by mouth.  . diltiazem (TIAZAC) 240 MG 24 hr capsule Take 240 mg by mouth daily.  . hydrALAZINE (APRESOLINE) 25 MG tablet TAKE 1 TABLET BY MOUTH TWICE DAILY  . lisinopril (PRINIVIL,ZESTRIL) 40 MG tablet Take 80 mg by mouth at bedtime.   . multivitamin (RENA-VIT) TABS tablet Take 1 tablet by mouth daily.   . pantoprazole (PROTONIX) 40 MG tablet Take 1 tablet (40 mg total) by mouth daily.  Marland Kitchen PARoxetine (PAXIL) 30 MG tablet Take 30 mg by mouth daily.  . predniSONE (DELTASONE) 10 MG tablet Take 1 tablet (10 mg total) by mouth daily with breakfast.  . traZODone (DESYREL) 50 MG tablet Take 0.5-1 tablets (25-50 mg total) by mouth at bedtime. (Patient taking differently: Take 50 mg by mouth at bedtime. )  . [DISCONTINUED] oxyCODONE (ROXICODONE) 5 MG immediate release tablet Take 1 tablet (5 mg total) by mouth every 4 (four) hours as needed.     Allergies:   Amlodipine, Sulfa antibiotics, and Vancomycin   Social History   Tobacco Use  . Smoking status: Never Smoker  . Smokeless tobacco: Never Used  Substance Use Topics  . Alcohol use: No  . Drug use: No     Family Hx: The patient's family history includes Asthma in her sister; COPD in her maternal uncle; Heart attack (age of onset: 29) in her mother; Hypertension in her mother. There is no history of Mental illness.  ROS:   Please see the history of present illness.     All other systems reviewed and are negative.   Prior CV studies:   The following studies were reviewed today:  Event monitoring in March 2019 demonstrated sinus rhythm with rare PACs.  Echocardiogram on 12/18/2017 demonstrated normal left ventricular systolic function, LVEF [id number], severe LVH with grade 1 diastolic dysfunction and high ventricular filling pressures, moderate to severe mitral annular calcification with mild mitral stenosis  and mild to moderate mitral regurgitation.  There is mild to moderate left atrial dilatation.  Pulmonary pressures were only mildly increased.  Labs/Other Tests and Data Reviewed:    EKG:  No ECG reviewed.  Recent Labs: 04/03/2019: Hemoglobin 11.6; Potassium 5.4; Sodium 135   Recent Lipid Panel No results found for: CHOL, TRIG, HDL, CHOLHDL, LDLCALC, LDLDIRECT  Wt Readings from Last 3 Encounters:  09/02/19 135 lb (61.2 kg)  04/29/19 138 lb (62.6 kg)  04/01/19 138 lb (62.6 kg)     Objective:    Vital Signs:  BP 139/88   Pulse 83   Ht 5\' 4"  (1.626 m)   Wt 135 lb (61.2 kg)   BMI 23.17 kg/m    VITAL SIGNS:  reviewed  ASSESSMENT & PLAN:    1. Palpitations: Symptomatically stable.  No arrhythmias seen with event monitoring in March 2019.  Continue long-acting diltiazem 240 mg daily.  Given her hypertensive heart disease and mitral valvular disease along with chronic kidney disease stage V, she is certainly at risk for atrial fibrillation.  2. Mitral valvular disease with stenosis and regurgitation: Echocardiogram from January  2019 reviewed above.  Symptomatically stable.  I will monitor with routine clinical and echocardiographic surveillance.  3. Hypertension: Blood pressure is borderline elevated but systolic readings at home have been in the 130 range.  Currently on hydralazine 25 mg twice daily, long-acting diltiazem 240 mg daily, and lisinopril 80 mg at bedtime (prescribed by nephrology).  No changes to therapy.  4. Hypertensive heart disease: Symptomatically stable.  Optimal blood pressure control is of prime importance.  Volume management is achieved with hemodialysis.   COVID-19 Education: The signs and symptoms of COVID-19 were discussed with the patient and how to seek care for testing (follow up with PCP or arrange E-visit).  The importance of social distancing was discussed today.  Time:   Today, I have spent 5 minutes with the patient with telehealth technology  discussing the above problems.     Medication Adjustments/Labs and Tests Ordered: Current medicines are reviewed at length with the patient today.  Concerns regarding medicines are outlined above.   Tests Ordered: No orders of the defined types were placed in this encounter.   Medication Changes: No orders of the defined types were placed in this encounter.   Follow Up:  Either In Person or Virtual Visit in 1 year(s)  Signed, Kate Sable, MD  09/02/2019 8:44 AM    [place]

## 2019-09-02 NOTE — Patient Instructions (Signed)
Your physician wants you to follow-up in: 1 YEAR WITH DR KONESWARAN You will receive a reminder letter in the mail two months in advance. If you don't receive a letter, please call our office to schedule the follow-up appointment.  Your physician recommends that you continue on your current medications as directed. Please refer to the Current Medication list given to you today.  Thank you for choosing [facility]!!    

## 2019-09-03 DIAGNOSIS — Z992 Dependence on renal dialysis: Secondary | ICD-10-CM | POA: Diagnosis not present

## 2019-09-03 DIAGNOSIS — D631 Anemia in chronic kidney disease: Secondary | ICD-10-CM | POA: Diagnosis not present

## 2019-09-03 DIAGNOSIS — N186 End stage renal disease: Secondary | ICD-10-CM | POA: Diagnosis not present

## 2019-09-03 DIAGNOSIS — D509 Iron deficiency anemia, unspecified: Secondary | ICD-10-CM | POA: Diagnosis not present

## 2019-09-03 DIAGNOSIS — N2581 Secondary hyperparathyroidism of renal origin: Secondary | ICD-10-CM | POA: Diagnosis not present

## 2019-09-05 DIAGNOSIS — D631 Anemia in chronic kidney disease: Secondary | ICD-10-CM | POA: Diagnosis not present

## 2019-09-05 DIAGNOSIS — Z992 Dependence on renal dialysis: Secondary | ICD-10-CM | POA: Diagnosis not present

## 2019-09-05 DIAGNOSIS — N186 End stage renal disease: Secondary | ICD-10-CM | POA: Diagnosis not present

## 2019-09-05 DIAGNOSIS — N2581 Secondary hyperparathyroidism of renal origin: Secondary | ICD-10-CM | POA: Diagnosis not present

## 2019-09-05 DIAGNOSIS — D509 Iron deficiency anemia, unspecified: Secondary | ICD-10-CM | POA: Diagnosis not present

## 2019-09-08 DIAGNOSIS — D631 Anemia in chronic kidney disease: Secondary | ICD-10-CM | POA: Diagnosis not present

## 2019-09-08 DIAGNOSIS — N186 End stage renal disease: Secondary | ICD-10-CM | POA: Diagnosis not present

## 2019-09-08 DIAGNOSIS — N2581 Secondary hyperparathyroidism of renal origin: Secondary | ICD-10-CM | POA: Diagnosis not present

## 2019-09-08 DIAGNOSIS — D509 Iron deficiency anemia, unspecified: Secondary | ICD-10-CM | POA: Diagnosis not present

## 2019-09-08 DIAGNOSIS — Z992 Dependence on renal dialysis: Secondary | ICD-10-CM | POA: Diagnosis not present

## 2019-09-10 DIAGNOSIS — N186 End stage renal disease: Secondary | ICD-10-CM | POA: Diagnosis not present

## 2019-09-10 DIAGNOSIS — D509 Iron deficiency anemia, unspecified: Secondary | ICD-10-CM | POA: Diagnosis not present

## 2019-09-10 DIAGNOSIS — N2581 Secondary hyperparathyroidism of renal origin: Secondary | ICD-10-CM | POA: Diagnosis not present

## 2019-09-10 DIAGNOSIS — D631 Anemia in chronic kidney disease: Secondary | ICD-10-CM | POA: Diagnosis not present

## 2019-09-10 DIAGNOSIS — Z992 Dependence on renal dialysis: Secondary | ICD-10-CM | POA: Diagnosis not present

## 2019-09-12 DIAGNOSIS — N2581 Secondary hyperparathyroidism of renal origin: Secondary | ICD-10-CM | POA: Diagnosis not present

## 2019-09-12 DIAGNOSIS — D509 Iron deficiency anemia, unspecified: Secondary | ICD-10-CM | POA: Diagnosis not present

## 2019-09-12 DIAGNOSIS — N186 End stage renal disease: Secondary | ICD-10-CM | POA: Diagnosis not present

## 2019-09-12 DIAGNOSIS — D631 Anemia in chronic kidney disease: Secondary | ICD-10-CM | POA: Diagnosis not present

## 2019-09-12 DIAGNOSIS — Z992 Dependence on renal dialysis: Secondary | ICD-10-CM | POA: Diagnosis not present

## 2019-09-15 DIAGNOSIS — N186 End stage renal disease: Secondary | ICD-10-CM | POA: Diagnosis not present

## 2019-09-15 DIAGNOSIS — N2581 Secondary hyperparathyroidism of renal origin: Secondary | ICD-10-CM | POA: Diagnosis not present

## 2019-09-15 DIAGNOSIS — D631 Anemia in chronic kidney disease: Secondary | ICD-10-CM | POA: Diagnosis not present

## 2019-09-15 DIAGNOSIS — D509 Iron deficiency anemia, unspecified: Secondary | ICD-10-CM | POA: Diagnosis not present

## 2019-09-15 DIAGNOSIS — Z992 Dependence on renal dialysis: Secondary | ICD-10-CM | POA: Diagnosis not present

## 2019-09-16 DIAGNOSIS — F419 Anxiety disorder, unspecified: Secondary | ICD-10-CM | POA: Diagnosis not present

## 2019-09-16 DIAGNOSIS — M329 Systemic lupus erythematosus, unspecified: Secondary | ICD-10-CM | POA: Diagnosis not present

## 2019-09-16 DIAGNOSIS — F329 Major depressive disorder, single episode, unspecified: Secondary | ICD-10-CM | POA: Diagnosis not present

## 2019-09-17 DIAGNOSIS — Z992 Dependence on renal dialysis: Secondary | ICD-10-CM | POA: Diagnosis not present

## 2019-09-17 DIAGNOSIS — D631 Anemia in chronic kidney disease: Secondary | ICD-10-CM | POA: Diagnosis not present

## 2019-09-17 DIAGNOSIS — N186 End stage renal disease: Secondary | ICD-10-CM | POA: Diagnosis not present

## 2019-09-17 DIAGNOSIS — N2581 Secondary hyperparathyroidism of renal origin: Secondary | ICD-10-CM | POA: Diagnosis not present

## 2019-09-17 DIAGNOSIS — D509 Iron deficiency anemia, unspecified: Secondary | ICD-10-CM | POA: Diagnosis not present

## 2019-09-19 DIAGNOSIS — Z992 Dependence on renal dialysis: Secondary | ICD-10-CM | POA: Diagnosis not present

## 2019-09-19 DIAGNOSIS — N186 End stage renal disease: Secondary | ICD-10-CM | POA: Diagnosis not present

## 2019-09-19 DIAGNOSIS — D631 Anemia in chronic kidney disease: Secondary | ICD-10-CM | POA: Diagnosis not present

## 2019-09-19 DIAGNOSIS — D509 Iron deficiency anemia, unspecified: Secondary | ICD-10-CM | POA: Diagnosis not present

## 2019-09-19 DIAGNOSIS — N2581 Secondary hyperparathyroidism of renal origin: Secondary | ICD-10-CM | POA: Diagnosis not present

## 2019-09-22 DIAGNOSIS — N2581 Secondary hyperparathyroidism of renal origin: Secondary | ICD-10-CM | POA: Diagnosis not present

## 2019-09-22 DIAGNOSIS — Z992 Dependence on renal dialysis: Secondary | ICD-10-CM | POA: Diagnosis not present

## 2019-09-22 DIAGNOSIS — D631 Anemia in chronic kidney disease: Secondary | ICD-10-CM | POA: Diagnosis not present

## 2019-09-22 DIAGNOSIS — D509 Iron deficiency anemia, unspecified: Secondary | ICD-10-CM | POA: Diagnosis not present

## 2019-09-22 DIAGNOSIS — N186 End stage renal disease: Secondary | ICD-10-CM | POA: Diagnosis not present

## 2019-09-24 ENCOUNTER — Observation Stay (HOSPITAL_COMMUNITY): Payer: Medicare Other

## 2019-09-24 ENCOUNTER — Other Ambulatory Visit: Payer: Self-pay

## 2019-09-24 ENCOUNTER — Observation Stay (HOSPITAL_COMMUNITY): Payer: Medicare Other | Admitting: Anesthesiology

## 2019-09-24 ENCOUNTER — Inpatient Hospital Stay (HOSPITAL_COMMUNITY)
Admission: EM | Admit: 2019-09-24 | Discharge: 2019-09-26 | DRG: 314 | Disposition: A | Discharge status: Home / Self Care | Payer: Medicare Other | Attending: Internal Medicine | Admitting: Internal Medicine

## 2019-09-24 ENCOUNTER — Encounter (HOSPITAL_COMMUNITY): Payer: Self-pay | Admitting: Emergency Medicine

## 2019-09-24 ENCOUNTER — Encounter (HOSPITAL_COMMUNITY)
Admission: EM | Disposition: A | Discharge status: Home / Self Care | Payer: Self-pay | Source: Home / Self Care | Attending: Internal Medicine

## 2019-09-24 DIAGNOSIS — N186 End stage renal disease: Secondary | ICD-10-CM | POA: Diagnosis present

## 2019-09-24 DIAGNOSIS — Z7952 Long term (current) use of systemic steroids: Secondary | ICD-10-CM

## 2019-09-24 DIAGNOSIS — D62 Acute posthemorrhagic anemia: Secondary | ICD-10-CM | POA: Diagnosis not present

## 2019-09-24 DIAGNOSIS — Z452 Encounter for adjustment and management of vascular access device: Secondary | ICD-10-CM | POA: Diagnosis not present

## 2019-09-24 DIAGNOSIS — Z7902 Long term (current) use of antithrombotics/antiplatelets: Secondary | ICD-10-CM

## 2019-09-24 DIAGNOSIS — E8889 Other specified metabolic disorders: Secondary | ICD-10-CM | POA: Diagnosis present

## 2019-09-24 DIAGNOSIS — E875 Hyperkalemia: Secondary | ICD-10-CM

## 2019-09-24 DIAGNOSIS — M3214 Glomerular disease in systemic lupus erythematosus: Secondary | ICD-10-CM | POA: Diagnosis present

## 2019-09-24 DIAGNOSIS — T82868A Thrombosis of vascular prosthetic devices, implants and grafts, initial encounter: Principal | ICD-10-CM | POA: Diagnosis present

## 2019-09-24 DIAGNOSIS — Z881 Allergy status to other antibiotic agents status: Secondary | ICD-10-CM

## 2019-09-24 DIAGNOSIS — J986 Disorders of diaphragm: Secondary | ICD-10-CM | POA: Diagnosis not present

## 2019-09-24 DIAGNOSIS — N2581 Secondary hyperparathyroidism of renal origin: Secondary | ICD-10-CM | POA: Diagnosis present

## 2019-09-24 DIAGNOSIS — D631 Anemia in chronic kidney disease: Secondary | ICD-10-CM | POA: Diagnosis present

## 2019-09-24 DIAGNOSIS — Z20828 Contact with and (suspected) exposure to other viral communicable diseases: Secondary | ICD-10-CM | POA: Diagnosis not present

## 2019-09-24 DIAGNOSIS — Z79899 Other long term (current) drug therapy: Secondary | ICD-10-CM

## 2019-09-24 DIAGNOSIS — T829XXA Unspecified complication of cardiac and vascular prosthetic device, implant and graft, initial encounter: Secondary | ICD-10-CM

## 2019-09-24 DIAGNOSIS — D509 Iron deficiency anemia, unspecified: Secondary | ICD-10-CM | POA: Diagnosis not present

## 2019-09-24 DIAGNOSIS — Y832 Surgical operation with anastomosis, bypass or graft as the cause of abnormal reaction of the patient, or of later complication, without mention of misadventure at the time of the procedure: Secondary | ICD-10-CM | POA: Diagnosis present

## 2019-09-24 DIAGNOSIS — I70208 Unspecified atherosclerosis of native arteries of extremities, other extremity: Secondary | ICD-10-CM | POA: Diagnosis not present

## 2019-09-24 DIAGNOSIS — Z825 Family history of asthma and other chronic lower respiratory diseases: Secondary | ICD-10-CM

## 2019-09-24 DIAGNOSIS — Z992 Dependence on renal dialysis: Secondary | ICD-10-CM | POA: Diagnosis not present

## 2019-09-24 DIAGNOSIS — E785 Hyperlipidemia, unspecified: Secondary | ICD-10-CM | POA: Diagnosis not present

## 2019-09-24 DIAGNOSIS — R55 Syncope and collapse: Secondary | ICD-10-CM | POA: Diagnosis not present

## 2019-09-24 DIAGNOSIS — F329 Major depressive disorder, single episode, unspecified: Secondary | ICD-10-CM | POA: Diagnosis present

## 2019-09-24 DIAGNOSIS — T82590A Other mechanical complication of surgically created arteriovenous fistula, initial encounter: Secondary | ICD-10-CM | POA: Diagnosis present

## 2019-09-24 DIAGNOSIS — Z882 Allergy status to sulfonamides status: Secondary | ICD-10-CM

## 2019-09-24 DIAGNOSIS — T82818A Embolism of vascular prosthetic devices, implants and grafts, initial encounter: Secondary | ICD-10-CM | POA: Diagnosis not present

## 2019-09-24 DIAGNOSIS — R0902 Hypoxemia: Secondary | ICD-10-CM | POA: Diagnosis not present

## 2019-09-24 DIAGNOSIS — Z8249 Family history of ischemic heart disease and other diseases of the circulatory system: Secondary | ICD-10-CM

## 2019-09-24 DIAGNOSIS — Z419 Encounter for procedure for purposes other than remedying health state, unspecified: Secondary | ICD-10-CM

## 2019-09-24 DIAGNOSIS — Z888 Allergy status to other drugs, medicaments and biological substances status: Secondary | ICD-10-CM

## 2019-09-24 DIAGNOSIS — S40022A Contusion of left upper arm, initial encounter: Secondary | ICD-10-CM | POA: Diagnosis present

## 2019-09-24 DIAGNOSIS — M79602 Pain in left arm: Secondary | ICD-10-CM | POA: Diagnosis not present

## 2019-09-24 DIAGNOSIS — I120 Hypertensive chronic kidney disease with stage 5 chronic kidney disease or end stage renal disease: Secondary | ICD-10-CM | POA: Diagnosis present

## 2019-09-24 DIAGNOSIS — T82838A Hemorrhage of vascular prosthetic devices, implants and grafts, initial encounter: Secondary | ICD-10-CM | POA: Diagnosis not present

## 2019-09-24 DIAGNOSIS — K219 Gastro-esophageal reflux disease without esophagitis: Secondary | ICD-10-CM | POA: Diagnosis present

## 2019-09-24 HISTORY — PX: ULTRASOUND GUIDANCE FOR VASCULAR ACCESS: SHX6516

## 2019-09-24 HISTORY — PX: INSERTION OF DIALYSIS CATHETER: SHX1324

## 2019-09-24 HISTORY — PX: HEMATOMA EVACUATION: SHX5118

## 2019-09-24 HISTORY — PX: FISTULOGRAM: SHX5832

## 2019-09-24 LAB — COMPREHENSIVE METABOLIC PANEL
ALT: 17 U/L (ref 0–44)
AST: 15 U/L (ref 15–41)
Albumin: 2.9 g/dL — ABNORMAL LOW (ref 3.5–5.0)
Alkaline Phosphatase: 73 U/L (ref 38–126)
Anion gap: 13 (ref 5–15)
BUN: 50 mg/dL — ABNORMAL HIGH (ref 6–20)
CO2: 25 mmol/L (ref 22–32)
Calcium: 8.9 mg/dL (ref 8.9–10.3)
Chloride: 97 mmol/L — ABNORMAL LOW (ref 98–111)
Creatinine, Ser: 8.08 mg/dL — ABNORMAL HIGH (ref 0.44–1.00)
GFR calc Af Amer: 6 mL/min — ABNORMAL LOW (ref >60)
GFR calc non Af Amer: 6 mL/min — ABNORMAL LOW (ref >60)
Glucose, Bld: 178 mg/dL — ABNORMAL HIGH (ref 70–99)
Potassium: 5.2 mmol/L — ABNORMAL HIGH (ref 3.5–5.1)
Sodium: 135 mmol/L (ref 135–145)
Total Bilirubin: 0.4 mg/dL (ref 0.3–1.2)
Total Protein: 5.7 g/dL — ABNORMAL LOW (ref 6.5–8.1)

## 2019-09-24 LAB — PREPARE RBC (CROSSMATCH)

## 2019-09-24 LAB — CBC WITH DIFFERENTIAL/PLATELET
Abs Immature Granulocytes: 0.56 10*3/uL — ABNORMAL HIGH (ref 0.00–0.07)
Basophils Absolute: 0.1 10*3/uL (ref 0.0–0.1)
Basophils Relative: 0 %
Eosinophils Absolute: 0 10*3/uL (ref 0.0–0.5)
Eosinophils Relative: 0 %
HCT: 25.9 % — ABNORMAL LOW (ref 36.0–46.0)
Hemoglobin: 8.2 g/dL — ABNORMAL LOW (ref 12.0–15.0)
Immature Granulocytes: 3 %
Lymphocytes Relative: 2 %
Lymphs Abs: 0.4 10*3/uL — ABNORMAL LOW (ref 0.7–4.0)
MCH: 29.5 pg (ref 26.0–34.0)
MCHC: 31.7 g/dL (ref 30.0–36.0)
MCV: 93.2 fL (ref 80.0–100.0)
Monocytes Absolute: 0.9 10*3/uL (ref 0.1–1.0)
Monocytes Relative: 5 %
Neutro Abs: 17.1 10*3/uL — ABNORMAL HIGH (ref 1.7–7.7)
Neutrophils Relative %: 90 %
Platelets: 130 10*3/uL — ABNORMAL LOW (ref 150–400)
RBC: 2.78 MIL/uL — ABNORMAL LOW (ref 3.87–5.11)
RDW: 17.4 % — ABNORMAL HIGH (ref 11.5–15.5)
WBC: 19 10*3/uL — ABNORMAL HIGH (ref 4.0–10.5)
nRBC: 0 % (ref 0.0–0.2)

## 2019-09-24 LAB — BASIC METABOLIC PANEL
Anion gap: 13 (ref 5–15)
BUN: 53 mg/dL — ABNORMAL HIGH (ref 6–20)
CO2: 22 mmol/L (ref 22–32)
Calcium: 9.2 mg/dL (ref 8.9–10.3)
Chloride: 104 mmol/L (ref 98–111)
Creatinine, Ser: 8.61 mg/dL — ABNORMAL HIGH (ref 0.44–1.00)
GFR calc Af Amer: 6 mL/min — ABNORMAL LOW (ref >60)
GFR calc non Af Amer: 5 mL/min — ABNORMAL LOW (ref >60)
Glucose, Bld: 132 mg/dL — ABNORMAL HIGH (ref 70–99)
Potassium: 5.3 mmol/L — ABNORMAL HIGH (ref 3.5–5.1)
Sodium: 139 mmol/L (ref 135–145)

## 2019-09-24 LAB — HEMOGLOBIN AND HEMATOCRIT, BLOOD
HCT: 16 % — ABNORMAL LOW (ref 36.0–46.0)
HCT: 15.1 % — ABNORMAL LOW (ref 36.0–46.0)
Hemoglobin: 5 g/dL — CL (ref 12.0–15.0)
Hemoglobin: 4.9 g/dL — CL (ref 12.0–15.0)

## 2019-09-24 LAB — HIV ANTIBODY (ROUTINE TESTING W REFLEX): HIV Screen 4th Generation wRfx: NONREACTIVE

## 2019-09-24 LAB — GLUCOSE, CAPILLARY: Glucose-Capillary: 165 mg/dL — ABNORMAL HIGH (ref 70–99)

## 2019-09-24 LAB — HCG, SERUM, QUALITATIVE: Preg, Serum: NEGATIVE

## 2019-09-24 LAB — SARS CORONAVIRUS 2 (TAT 6-24 HRS): SARS Coronavirus 2: NEGATIVE

## 2019-09-24 SURGERY — EVACUATION HEMATOMA
Anesthesia: General | Site: Neck

## 2019-09-24 MED ORDER — FENTANYL CITRATE (PF) 100 MCG/2ML IJ SOLN
25.0000 ug | Freq: Once | INTRAMUSCULAR | Status: AC
  Administered 2019-09-24: 25 ug via INTRAVENOUS
  Filled 2019-09-24: qty 2

## 2019-09-24 MED ORDER — FENTANYL CITRATE (PF) 250 MCG/5ML IJ SOLN
INTRAMUSCULAR | Status: AC
  Filled 2019-09-24: qty 5

## 2019-09-24 MED ORDER — FENTANYL CITRATE (PF) 100 MCG/2ML IJ SOLN: 25.0000 ug | INTRAMUSCULAR | Status: DC | PRN

## 2019-09-24 MED ORDER — RENA-VITE PO TABS
1.0000 | ORAL_TABLET | Freq: Every day | ORAL | Status: DC
  Administered 2019-09-25: 1 via ORAL
  Filled 2019-09-24: qty 1

## 2019-09-24 MED ORDER — ONDANSETRON HCL 4 MG/2ML IJ SOLN
INTRAMUSCULAR | Status: DC | PRN
  Administered 2019-09-24: 4 mg via INTRAVENOUS

## 2019-09-24 MED ORDER — PANTOPRAZOLE SODIUM 40 MG PO TBEC
40.0000 mg | DELAYED_RELEASE_TABLET | Freq: Every day | ORAL | Status: DC
  Administered 2019-09-25: 40 mg via ORAL
  Filled 2019-09-24: qty 1

## 2019-09-24 MED ORDER — OXYCODONE HCL 5 MG/5ML PO SOLN: 5.0000 mg | Freq: Once | ORAL | Status: DC | PRN

## 2019-09-24 MED ORDER — DEXAMETHASONE SODIUM PHOSPHATE 10 MG/ML IJ SOLN
INTRAMUSCULAR | Status: DC | PRN
  Administered 2019-09-24: 5 mg via INTRAVENOUS

## 2019-09-24 MED ORDER — PREDNISONE 10 MG PO TABS
10.0000 mg | ORAL_TABLET | Freq: Every day | ORAL | Status: DC
  Administered 2019-09-25: 10 mg via ORAL
  Filled 2019-09-24: qty 1

## 2019-09-24 MED ORDER — STERILE WATER FOR IRRIGATION IR SOLN
Status: DC | PRN
  Administered 2019-09-24: 1000 mL

## 2019-09-24 MED ORDER — LIDOCAINE 2% (20 MG/ML) 5 ML SYRINGE
INTRAMUSCULAR | Status: AC
  Filled 2019-09-24: qty 5

## 2019-09-24 MED ORDER — FENTANYL CITRATE (PF) 250 MCG/5ML IJ SOLN
INTRAMUSCULAR | Status: DC | PRN
  Administered 2019-09-24: 100 ug via INTRAVENOUS
  Administered 2019-09-24: 25 ug via INTRAVENOUS

## 2019-09-24 MED ORDER — SODIUM CHLORIDE 0.9 % IV SOLN
INTRAVENOUS | Status: DC
  Administered 2019-09-24 (×3): via INTRAVENOUS

## 2019-09-24 MED ORDER — MIDAZOLAM HCL 2 MG/2ML IJ SOLN
INTRAMUSCULAR | Status: AC
  Filled 2019-09-24: qty 2

## 2019-09-24 MED ORDER — CHLORHEXIDINE GLUCONATE CLOTH 2 % EX PADS
6.0000 | MEDICATED_PAD | Freq: Every day | CUTANEOUS | Status: DC
  Administered 2019-09-25: 6 via TOPICAL

## 2019-09-24 MED ORDER — ALBUMIN HUMAN 5 % IV SOLN
INTRAVENOUS | Status: DC | PRN
  Administered 2019-09-24 (×2): via INTRAVENOUS

## 2019-09-24 MED ORDER — OXYCODONE HCL 5 MG PO TABS: 5.0000 mg | ORAL_TABLET | Freq: Once | ORAL | Status: DC | PRN

## 2019-09-24 MED ORDER — INSULIN ASPART 100 UNIT/ML ~~LOC~~ SOLN
SUBCUTANEOUS | Status: DC | PRN
  Administered 2019-09-24: 10 [IU] via SUBCUTANEOUS

## 2019-09-24 MED ORDER — SODIUM CHLORIDE 0.9% IV SOLUTION
Freq: Once | INTRAVENOUS | Status: AC
  Administered 2019-09-25: 01:00:00 via INTRAVENOUS

## 2019-09-24 MED ORDER — SODIUM CHLORIDE 0.9 % IV SOLN
INTRAVENOUS | Status: AC
  Filled 2019-09-24: qty 1.2

## 2019-09-24 MED ORDER — HEPARIN SODIUM (PORCINE) 1000 UNIT/ML IJ SOLN
INTRAMUSCULAR | Status: DC | PRN
  Administered 2019-09-24: 3800 [IU] via INTRAVENOUS
  Administered 2019-09-25: 19:00:00 3800 [IU] via ARTERIOVENOUS_FISTULA

## 2019-09-24 MED ORDER — PROPOFOL 10 MG/ML IV BOLUS
INTRAVENOUS | Status: DC | PRN
  Administered 2019-09-24: 150 mg via INTRAVENOUS

## 2019-09-24 MED ORDER — CEFAZOLIN SODIUM-DEXTROSE 2-4 GM/100ML-% IV SOLN
2.0000 g | Freq: Once | INTRAVENOUS | Status: AC
  Administered 2019-09-24: 2 g via INTRAVENOUS
  Filled 2019-09-24: qty 100

## 2019-09-24 MED ORDER — LIDOCAINE HCL (PF) 1 % IJ SOLN
INTRAMUSCULAR | Status: AC
  Filled 2019-09-24: qty 60

## 2019-09-24 MED ORDER — LIDOCAINE 2% (20 MG/ML) 5 ML SYRINGE
INTRAMUSCULAR | Status: DC | PRN
  Administered 2019-09-24: 50 mg via INTRAVENOUS

## 2019-09-24 MED ORDER — PHENYLEPHRINE HCL-NACL 10-0.9 MG/250ML-% IV SOLN
INTRAVENOUS | Status: DC | PRN
  Administered 2019-09-24: 20 ug/min via INTRAVENOUS

## 2019-09-24 MED ORDER — CALCIUM CHLORIDE 10 % IV SOLN
INTRAVENOUS | Status: DC | PRN
  Administered 2019-09-24 (×5): 200 mg via INTRAVENOUS

## 2019-09-24 MED ORDER — ONDANSETRON HCL 4 MG/2ML IJ SOLN: 4.0000 mg | Freq: Once | INTRAMUSCULAR | Status: DC | PRN

## 2019-09-24 MED ORDER — DEXTROSE 50 % IV SOLN
INTRAVENOUS | Status: DC | PRN
  Administered 2019-09-24: 12.5 g via INTRAVENOUS

## 2019-09-24 MED ORDER — HEPARIN SODIUM (PORCINE) 1000 UNIT/ML IJ SOLN
INTRAMUSCULAR | Status: AC
  Filled 2019-09-24: qty 1

## 2019-09-24 MED ORDER — 0.9 % SODIUM CHLORIDE (POUR BTL) OPTIME
TOPICAL | Status: DC | PRN
  Administered 2019-09-24: 1000 mL

## 2019-09-24 MED ORDER — ONDANSETRON HCL 4 MG/2ML IJ SOLN
4.0000 mg | Freq: Once | INTRAMUSCULAR | Status: AC
  Administered 2019-09-24: 10:00:00 4 mg via INTRAVENOUS
  Filled 2019-09-24: qty 2

## 2019-09-24 MED ORDER — HYDROMORPHONE HCL 1 MG/ML IJ SOLN
0.5000 mg | INTRAMUSCULAR | Status: DC | PRN
  Administered 2019-09-25: 1 mg via INTRAVENOUS
  Filled 2019-09-24: qty 1

## 2019-09-24 MED ORDER — MIDAZOLAM HCL 2 MG/2ML IJ SOLN
INTRAMUSCULAR | Status: DC | PRN
  Administered 2019-09-24: 2 mg via INTRAVENOUS

## 2019-09-24 MED ORDER — CALCIUM CHLORIDE 10 % IV SOLN
INTRAVENOUS | Status: AC
  Filled 2019-09-24: qty 10

## 2019-09-24 MED ORDER — OXYCODONE-ACETAMINOPHEN 5-325 MG PO TABS
1.0000 | ORAL_TABLET | ORAL | Status: DC | PRN
  Administered 2019-09-25: 2 via ORAL
  Filled 2019-09-24: qty 2

## 2019-09-24 MED ORDER — PAROXETINE HCL 30 MG PO TABS
30.0000 mg | ORAL_TABLET | Freq: Every day | ORAL | Status: DC
  Administered 2019-09-25: 30 mg via ORAL
  Filled 2019-09-24 (×2): qty 1

## 2019-09-24 MED ORDER — TRAZODONE HCL 50 MG PO TABS: 50.0000 mg | ORAL_TABLET | Freq: Every day | ORAL | Status: DC

## 2019-09-24 MED ORDER — HYDROMORPHONE HCL 1 MG/ML PO LIQD
1.0000 mg | ORAL | Status: DC | PRN
  Administered 2019-09-24: 1 mg via ORAL
  Filled 2019-09-24: qty 1

## 2019-09-24 MED ORDER — SODIUM CHLORIDE 0.9 % IV SOLN
INTRAVENOUS | Status: DC | PRN
  Administered 2019-09-24: 500 mL

## 2019-09-24 MED ORDER — IODIXANOL 320 MG/ML IV SOLN
INTRAVENOUS | Status: DC | PRN
  Administered 2019-09-24: 10 mL

## 2019-09-24 SURGICAL SUPPLY — 96 items
ADH SKN CLS APL DERMABOND .7 (GAUZE/BANDAGES/DRESSINGS) ×8
AGENT HMST SPONGE THK3/8 (HEMOSTASIS)
ARMBAND PINK RESTRICT EXTREMIT (MISCELLANEOUS) ×10 IMPLANT
BAG DECANTER FOR FLEXI CONT (MISCELLANEOUS) ×6 IMPLANT
BANDAGE ESMARK 6X9 LF (GAUZE/BANDAGES/DRESSINGS) IMPLANT
BIOPATCH RED 1 DISK 7.0 (GAUZE/BANDAGES/DRESSINGS) ×5 IMPLANT
BIOPATCH RED 1IN DISK 7.0MM (GAUZE/BANDAGES/DRESSINGS) ×1
BNDG CMPR 9X6 STRL LF SNTH (GAUZE/BANDAGES/DRESSINGS)
BNDG ELASTIC 4X5.8 VLCR STR LF (GAUZE/BANDAGES/DRESSINGS) ×4 IMPLANT
BNDG ESMARK 6X9 LF (GAUZE/BANDAGES/DRESSINGS)
CANISTER SUCT 3000ML PPV (MISCELLANEOUS) ×6 IMPLANT
CATH PALINDROME RT-P 15FX19CM (CATHETERS) IMPLANT
CATH PALINDROME RT-P 15FX23CM (CATHETERS) IMPLANT
CATH PALINDROME RT-P 15FX28CM (CATHETERS) ×2 IMPLANT
CATH PALINDROME RT-P 15FX55CM (CATHETERS) IMPLANT
CATH STRAIGHT 5FR 65CM (CATHETERS) IMPLANT
CLIP VESOCCLUDE MED 6/CT (CLIP) ×6 IMPLANT
CLIP VESOCCLUDE SM WIDE 6/CT (CLIP) ×6 IMPLANT
COVER PROBE W GEL 5X96 (DRAPES) ×6 IMPLANT
COVER SURGICAL LIGHT HANDLE (MISCELLANEOUS) ×6 IMPLANT
COVER WAND RF STERILE (DRAPES) ×6 IMPLANT
CUFF TOURN SGL QUICK 18X4 (TOURNIQUET CUFF) IMPLANT
CUFF TOURN SGL QUICK 24 (TOURNIQUET CUFF)
CUFF TOURN SGL QUICK 34 (TOURNIQUET CUFF)
CUFF TOURN SGL QUICK 42 (TOURNIQUET CUFF) IMPLANT
CUFF TRNQT CYL 24X4X16.5-23 (TOURNIQUET CUFF) IMPLANT
CUFF TRNQT CYL 34X4.125X (TOURNIQUET CUFF) IMPLANT
DECANTER SPIKE VIAL GLASS SM (MISCELLANEOUS) ×6 IMPLANT
DERMABOND ADVANCED (GAUZE/BANDAGES/DRESSINGS) ×4
DERMABOND ADVANCED .7 DNX12 (GAUZE/BANDAGES/DRESSINGS) ×4 IMPLANT
DEVICE TORQUE KENDALL .025-038 (MISCELLANEOUS) ×2 IMPLANT
DRAIN CHANNEL 15F RND FF W/TCR (WOUND CARE) IMPLANT
DRAPE C-ARM 42X72 X-RAY (DRAPES) ×6 IMPLANT
DRAPE CHEST BREAST 15X10 FENES (DRAPES) ×6 IMPLANT
DRAPE ORTHO SPLIT 87X125 STRL (DRAPES) ×2 IMPLANT
ELECT REM PT RETURN 9FT ADLT (ELECTROSURGICAL) ×6
ELECTRODE REM PT RTRN 9FT ADLT (ELECTROSURGICAL) ×4 IMPLANT
EVACUATOR SILICONE 100CC (DRAIN) IMPLANT
GAUZE 4X4 16PLY RFD (DISPOSABLE) ×10 IMPLANT
GLOVE BIO SURGEON STRL SZ7.5 (GLOVE) ×6 IMPLANT
GLOVE BIOGEL PI IND STRL 6.5 (GLOVE) IMPLANT
GLOVE BIOGEL PI IND STRL 7.5 (GLOVE) IMPLANT
GLOVE BIOGEL PI IND STRL 8 (GLOVE) ×4 IMPLANT
GLOVE BIOGEL PI INDICATOR 6.5 (GLOVE) ×2
GLOVE BIOGEL PI INDICATOR 7.5 (GLOVE) ×2
GLOVE BIOGEL PI INDICATOR 8 (GLOVE) ×2
GLOVE SURG SS PI 7.5 STRL IVOR (GLOVE) ×2 IMPLANT
GOWN STRL REUS W/ TWL LRG LVL3 (GOWN DISPOSABLE) ×8 IMPLANT
GOWN STRL REUS W/ TWL XL LVL3 (GOWN DISPOSABLE) ×8 IMPLANT
GOWN STRL REUS W/TWL LRG LVL3 (GOWN DISPOSABLE) ×12
GOWN STRL REUS W/TWL XL LVL3 (GOWN DISPOSABLE) ×12
GUIDEWIRE ANGLED .035X150CM (WIRE) ×2 IMPLANT
HEMOSTAT SPONGE AVITENE ULTRA (HEMOSTASIS) IMPLANT
KIT BASIN OR (CUSTOM PROCEDURE TRAY) ×6 IMPLANT
KIT TURNOVER KIT B (KITS) ×6 IMPLANT
NDL 18GX1X1/2 (RX/OR ONLY) (NEEDLE) ×4 IMPLANT
NDL HYPO 25GX1X1/2 BEV (NEEDLE) ×4 IMPLANT
NDL PERC 18GX7CM (NEEDLE) IMPLANT
NEEDLE 18GX1X1/2 (RX/OR ONLY) (NEEDLE) ×6 IMPLANT
NEEDLE HYPO 25GX1X1/2 BEV (NEEDLE) ×6 IMPLANT
NEEDLE PERC 18GX7CM (NEEDLE) ×6 IMPLANT
NS IRRIG 1000ML POUR BTL (IV SOLUTION) ×12 IMPLANT
PACK CV ACCESS (CUSTOM PROCEDURE TRAY) ×6 IMPLANT
PACK GENERAL/GYN (CUSTOM PROCEDURE TRAY) IMPLANT
PACK PERIPHERAL VASCULAR (CUSTOM PROCEDURE TRAY) IMPLANT
PACK SURGICAL SETUP 50X90 (CUSTOM PROCEDURE TRAY) ×6 IMPLANT
PACK UNIVERSAL I (CUSTOM PROCEDURE TRAY) IMPLANT
PAD ARMBOARD 7.5X6 YLW CONV (MISCELLANEOUS) ×12 IMPLANT
SET MICROPUNCTURE 5F STIFF (MISCELLANEOUS) ×4 IMPLANT
SOAP 2 % CHG 4 OZ (WOUND CARE) ×6 IMPLANT
STAPLER VISISTAT 35W (STAPLE) IMPLANT
STOPCOCK MORSE 400PSI 3WAY (MISCELLANEOUS) ×2 IMPLANT
SUT ETHILON 3 0 PS 1 (SUTURE) ×10 IMPLANT
SUT GORETEX 6.0 TT13 (SUTURE) IMPLANT
SUT MNCRL AB 4-0 PS2 18 (SUTURE) ×8 IMPLANT
SUT PROLENE 5 0 C 1 24 (SUTURE) IMPLANT
SUT PROLENE 6 0 BV (SUTURE) ×8 IMPLANT
SUT PROLENE 7 0 BV 1 (SUTURE) IMPLANT
SUT SILK 2 0 PERMA HAND 18 BK (SUTURE) IMPLANT
SUT VIC AB 2-0 CT1 27 (SUTURE)
SUT VIC AB 2-0 CT1 TAPERPNT 27 (SUTURE) IMPLANT
SUT VIC AB 3-0 SH 27 (SUTURE) ×24
SUT VIC AB 3-0 SH 27X BRD (SUTURE) ×8 IMPLANT
SUT VIC AB 4-0 PS2 18 (SUTURE) ×2 IMPLANT
SYR 10ML LL (SYRINGE) ×6 IMPLANT
SYR 20ML LL LF (SYRINGE) ×14 IMPLANT
SYR 5ML LL (SYRINGE) ×6 IMPLANT
SYR CONTROL 10ML LL (SYRINGE) ×6 IMPLANT
TOWEL GREEN STERILE (TOWEL DISPOSABLE) ×6 IMPLANT
TOWEL GREEN STERILE FF (TOWEL DISPOSABLE) ×6 IMPLANT
TRAY FOLEY MTR SLVR 16FR STAT (SET/KITS/TRAYS/PACK) IMPLANT
UNDERPAD 30X30 (UNDERPADS AND DIAPERS) ×6 IMPLANT
WATER STERILE IRR 1000ML POUR (IV SOLUTION) ×6 IMPLANT
WIRE AMPLATZ SS-J .035X180CM (WIRE) IMPLANT
WIRE BENTSON .035X145CM (WIRE) ×2 IMPLANT
WIRE TORQFLEX AUST .018X40CM (WIRE) ×2 IMPLANT

## 2019-09-24 NOTE — ED Notes (Signed)
Pt transported to short stay bay 37.  Consent for procedure signed by pt and witnessed by me.

## 2019-09-24 NOTE — ED Provider Notes (Signed)
[place] EMERGENCY DEPARTMENT Provider Note   CSN: [id number] Arrival date & time: 09/24/19  0908     History   Chief Complaint Chief Complaint  Patient presents with  . Vascular Access Problem    HPI [name] is a 43 y.o. female.     HPI  Patient presents with concern of left arm pain. Patient states that she was in her usual state of health, in the middle of dialysis session, when something occurred. She is unsure if with positioning, or needle penetration, but she soon experienced sharp sudden onset of pain, which has been persistent, worsening since the event. No distal loss of sensation or weakness, no concurrent fever, nausea, vomiting, chest pain, dyspnea. No medication taken for relief. EMS reports the patient had evidence for infiltration of dialysis graft.  The patient notes that she has had the graft in place for about 6 years, but had a revision a few months ago here.  Past Medical History:  Diagnosis Date  . Allergy   . Anemia   . Arthritis   . Avascular necrosis of bone (HCC)    left tibial talus due to chronic prednisone use  . ESRD (end stage renal disease) on dialysis ([place])    tthsat [place]  . GERD (gastroesophageal reflux disease)   . Headache(784.0)   . Hyperlipemia   . Hypertension   . Lupus ([place])   . Secondary hyperparathyroidism (of renal origin)     Patient Active Problem List   Diagnosis Date Noted  . Mild dysplasia of cervix (CIN I) bx provern 10/2018 11/26/2018  . Heart palpitations 11/20/2017  . GERD (gastroesophageal reflux disease) 07/10/2016  . Thrombocytopenia ([place]) 03/17/2016  . Duodenal ulcer hemorrhagic   . Esophageal stricture   . Adjustment disorder with mixed anxiety and depressed mood 10/25/2015  . Lupus ([place]) 04/29/2015  . ESRD on dialysis ([place]) 10/09/2013  . Anemia in chronic kidney disease 10/09/2013  . Secondary renal hyperparathyroidism ([place]) 10/09/2013  . Essential  hypertension 10/09/2013    Past Surgical History:  Procedure Laterality Date  . A/V FISTULAGRAM Left 12/02/2017   Procedure: A/V FISTULAGRAM - Left upper;  Surgeon: Angelia Mould, MD;  Location: [place] [facility];  Service: Cardiovascular;  Laterality: Left;  . AV FISTULA PLACEMENT Left   . AV FISTULA PLACEMENT Right 01/26/2014   Procedure: ARTERIOVENOUS (AV) FISTULA CREATION; ultrasound guided;  Surgeon: Conrad Morehouse, MD;  Location: [place];  Service: Vascular;  Laterality: Right;  . ESOPHAGOGASTRODUODENOSCOPY N/A 03/17/2016   Procedure: ESOPHAGOGASTRODUODENOSCOPY (EGD);  Surgeon: Irene Shipper, MD;  Location: [facility] ENDOSCOPY;  Service: Endoscopy;  Laterality: N/A;  . HEMATOMA EVACUATION Left 10/09/2013   Procedure: EVACUATION HEMATOMA;  Surgeon: Angelia Mould, MD;  Location: [place];  Service: Vascular;  Laterality: Left;  . INCISION AND DRAINAGE ABSCESS     gluteal  . INSERTION OF DIALYSIS CATHETER N/A 10/09/2013   Procedure: INSERTION OF DIALYSIS CATHETER; ULTRASOUND GUIDED;  Surgeon: Angelia Mould, MD;  Location: [place];  Service: Vascular;  Laterality: N/A;  . PERIPHERAL VASCULAR BALLOON ANGIOPLASTY Left 12/02/2017   Procedure: PERIPHERAL VASCULAR BALLOON ANGIOPLASTY;  Surgeon: Angelia Mould, MD;  Location: [place] [facility];  Service: Cardiovascular;  Laterality: Left;  . REVISION OF ARTERIOVENOUS GORETEX GRAFT Left 04/03/2019   Procedure: REVISION OF ARTERIOVENOUS GORETEX GRAFT PSEUDOANEURYSM;  Surgeon: Angelia Mould, MD;  Location: [place];  Service: Vascular;  Laterality: Left;  . SHUNTOGRAM N/A 01/07/2014  Procedure: fistulogram with possibe venoplasty left upper arm avg;  Surgeon: Angelia Mould, MD;  Location: [facility] CATH LAB;  Service: Cardiovascular;  Laterality: N/A;  . tibiocalcaneal fusion  left Left      OB History    Gravida  0   Para  0   Term  0   Preterm  0   AB  0   Living  0     SAB  0   TAB  0   Ectopic  0    Multiple  0   Live Births  0            Home Medications    Prior to Admission medications   Medication Sig Start Date End Date Taking? Authorizing Provider  acetaminophen (TYLENOL) 500 MG tablet Take 500 mg by mouth daily as needed for headache.     [provider]  calcium acetate, Phos Binder, (PHOSLYRA) 667 MG/5ML SOLN Take 667 mg by mouth.    [provider]  diltiazem (TIAZAC) 240 MG 24 hr capsule Take 240 mg by mouth daily.    [provider]  hydrALAZINE (APRESOLINE) 25 MG tablet TAKE 1 TABLET BY MOUTH TWICE DAILY 06/25/19   Herminio Commons, MD  lisinopril (PRINIVIL,ZESTRIL) 40 MG tablet Take 80 mg by mouth at bedtime.  10/15/17   [provider]  multivitamin (RENA-VIT) TABS tablet Take 1 tablet by mouth daily.  03/30/19   [provider]  pantoprazole (PROTONIX) 40 MG tablet Take 1 tablet (40 mg total) by mouth daily. 12/04/17   Hoyt Koch, MD  PARoxetine (PAXIL) 30 MG tablet Take 30 mg by mouth daily.    [provider]  predniSONE (DELTASONE) 10 MG tablet Take 1 tablet (10 mg total) by mouth daily with breakfast. 08/29/17   Hoyt Koch, MD  traZODone (DESYREL) 50 MG tablet Take 0.5-1 tablets (25-50 mg total) by mouth at bedtime. Patient taking differently: Take 50 mg by mouth at bedtime.  08/29/17   Hoyt Koch, MD    Family History Family History  Problem Relation Age of Onset  . Asthma Sister   . Hypertension Mother   . Heart attack Mother 72       died at 94  . COPD Maternal Uncle        prostate  . Mental illness Neg Hx     Social History Social History   Tobacco Use  . Smoking status: Never Smoker  . Smokeless tobacco: Never Used  Substance Use Topics  . Alcohol use: No  . Drug use: No     Allergies   Amlodipine, Sulfa antibiotics, and Vancomycin   Review of Systems Review of Systems  Constitutional:       Per HPI, otherwise negative  HENT:       Per HPI,  otherwise negative  Respiratory:       Per HPI, otherwise negative  Cardiovascular:       Per HPI, otherwise negative  Gastrointestinal: Negative for vomiting.  Endocrine:       Negative aside from HPI  Genitourinary:       Neg aside from HPI   Musculoskeletal:       Per HPI, otherwise negative  Skin: Negative.   Allergic/Immunologic: Positive for immunocompromised state.  Neurological: Negative for syncope.     Physical Exam Updated Vital Signs BP 118/84   Pulse 83   Temp 98 F (36.7 C) (Oral)   Resp 16  Ht 5\' 4"  (1.626 m)   Wt 62 kg   LMP 09/14/2019 (Exact Date)   SpO2 98%   BMI 23.46 kg/m   Physical Exam Vitals signs and nursing note reviewed.  Constitutional:      General: She is not in acute distress.    Appearance: She is well-developed.  HENT:     Head: Normocephalic and atraumatic.  Eyes:     Conjunctiva/sclera: Conjunctivae normal.  Cardiovascular:     Rate and Rhythm: Normal rate and regular rhythm.     Pulses: Normal pulses.  Pulmonary:     Effort: Pulmonary effort is normal. No respiratory distress.     Breath sounds: Normal breath sounds. No stridor.  Abdominal:     General: There is no distension.  Musculoskeletal:     Left elbow: Normal.       Arms:  Skin:    General: Skin is warm and dry.  Neurological:     Mental Status: She is alert and oriented to person, place, and time.     Cranial Nerves: No cranial nerve deficit.      ED Treatments / Results  Labs (all labs ordered are listed, but only abnormal results are displayed) Labs Reviewed  COMPREHENSIVE METABOLIC PANEL - Abnormal; Notable for the following components:      Result Value   Potassium 5.2 (*)    Chloride 97 (*)    Glucose, Bld 178 (*)    BUN 50 (*)    Creatinine, Ser 8.08 (*)    Total Protein 5.7 (*)    Albumin 2.9 (*)    GFR calc non Af Amer 6 (*)    GFR calc Af Amer 6 (*)    All other components within normal limits  CBC WITH DIFFERENTIAL/PLATELET - Abnormal;  Notable for the following components:   WBC 19.0 (*)    RBC 2.78 (*)    Hemoglobin 8.2 (*)    HCT 25.9 (*)    RDW 17.4 (*)    Platelets 130 (*)    Neutro Abs 17.1 (*)    Lymphs Abs 0.4 (*)    Abs Immature Granulocytes 0.56 (*)    All other components within normal limits  SARS CORONAVIRUS 2 (TAT 6-24 HRS)    EKG EKG Interpretation  Date/Time:  Thursday September 24 2019 09:09:14 EST Ventricular Rate:  80 PR Interval:    QRS Duration: 124 QT Interval:  413 QTC Calculation: 477 R Axis:   -94 Text Interpretation: Sinus rhythm RBBB and LAFB No significant change since last tracing Abnormal ECG Confirmed by Carmin Muskrat [phone]) on 09/24/2019 9:11:39 AM   Radiology No results found.  Procedures Procedures (including critical care time)  Medications Ordered in ED Medications  fentaNYL (SUBLIMAZE) injection 25 mcg (25 mcg Intravenous Given 09/24/19 1000)  ondansetron (ZOFRAN) injection 4 mg (4 mg Intravenous Given 09/24/19 1000)     Initial Impression / Assessment and Plan / ED Course  I have reviewed the triage vital signs and the nursing notes.  Pertinent labs & imaging results that were available during my care of the patient were reviewed by me and considered in my medical decision making (see chart for details).    Update: I discussed the patient's case soon after her arrival with our nephrology colleagues.  With concern for infiltration, recommendation for admission for consideration of new access placement, IR versus vascular consult.  Update: I discussed the patient's case with Dr. Carlis Abbott our vascular surgeon on-call. He has seen and  evaluated the patient. Patient is now resting comfortably, with appropriate pain control after IV narcotics.  Her left arm continues to increase in size, but she continues to deny distal changes, loss of function. Findings thus far notable for mild hyperkalemia, consistent with patient's need for dialysis today. With concern for her  infiltration, ultrasound has been ordered, after discussion with Dr. Carlis Abbott, and the plan is for the patient to have intervention, placement of tunneled catheter later today for dialysis.  Update: I have discussed the patient's case with our internal medicine colleagues for admission.   This 43 year old female end-stage renal disease presents after possible infiltration of her left AV graft.  Patient is awake, alert, hemodynamically unremarkable, is found to have mild hyperkalemia, but requires admission for further evaluation, monitoring, management of her left arm lesion, and placement of catheter, with consideration of dialysis needs.  Final Clinical Impressions(s) / ED Diagnoses   Final diagnoses:  Complication of vascular access for dialysis, initial encounter  Hyperkalemia     Carmin Muskrat, MD 09/24/19 1225

## 2019-09-24 NOTE — Progress Notes (Signed)
Called and spoke with Dr. Carlis Abbott about a critical Hgb=4.9. Per Dr. Carlis Abbott orders where given to transfuse 3 units of PRBC. Orders where placed according to Dr. Carlis Abbott orders. One unit of PRBC was started in PACU.

## 2019-09-24 NOTE — Op Note (Signed)
Date: September 24, 2019  Preoperative diagnosis: Infiltration of left upper arm loop graft with large hematoma  Postoperative diagnosis: Same  Procedure: 1.  Ultrasound-guided access of the right internal jugular vein 2.  Ultrasound-guided access of the left internal jugular vein 3.  Placement of left internal jugular vein tunneled dialysis catheter (28 cm palindrome) 4.  Incision and drainage of left arm hematoma 5.  Left arm fistulogram  Surgeon: Dr. [name], MD  Assistant: Arlee Muslim, PA  Indication: Patient is a 43 year old female with end-stage renal disease that currently dialyzes via left upper arm loop graft.  This graft has undergone multiple previous revisions as documented in the medical chart.  Ultimately she presented to the ED from dialysis today after infiltration of her left arm AV graft and developing a large hematoma in her arm.  Ultimately recommended proceeding to the operating room for tunneled dialysis catheter placement as well as an attempt to salvage the fistula given limited options.  Findings: 1.  The right internal jugular vein was accessed successfully with ultrasound but there was a central venous occlusion and could not place catheter on the right side of her neck.  Ultimately the left internal jugular vein was then accessed and successfully placed a 28 cm palindrome catheter with the tip in the right atrium. 2.  The left arm hematoma was noted to continue expanding on the operative table.  Ultimately ended up cutting down on the arterial limb of the left upper arm loop graft in order to get proximal control.  Evacuated about 700 mL of hematoma from the left arm.  No active bleeding was identified although all the soft tissue was very oozy.  Ultimately performed left upper extremity fistulogram and there was no overt extravasation identified.  This graft has been stented on multiple occasions and looks very diseased throughout its course.  After  irrigation of hematoma cavity and evacuation, pressure dressing applied to left upper extremity.  Anesthesia: LMA  EBL: 700 mL in left arm hematoma  Details: The patient was taken the operating room after informed consent was obtained.  She was placed on operative table in the supine position.  Initially her bilateral neck as well as her left arm was prepped and draped in usual sterile fashion.  Timeout was performed identify patient, procedure and site.  Initially used sterile ultrasound probe and evaluated the right internal jugular vein it was patent and images saved.  Access the right internal jugular vein with an 18-gauge needle and placed a J-wire.  Unfortunately the wire would not advance centrally due to what appeared to be a central venous occlusion.  I did exchanged for a Glidewire advantage and also would not advance under fluoroscopic guidance.  That point time I removed the needle in the right internal jugular vein and held pressure.  I then evaluate the left internal jugular vein it was patent and image was saved under ultrasound guidance.  Access left internal jugular vein with a 18-gauge needle and threaded a J-wire down into the superior vena cava across the innominate.  That point time the wire was clamped.  I then brought a 28 cm palindrome catheter on the field and measured on the chest wall.  I made a counterincision on the chest wall.  I ultimately tunneled from the chest wall counterincision to the IJ stick incision with a tunneler.  That point time the catheter was transected #1 attached to the tunneler and it was tunneled under the skin burying the  cuff.  I then sequentially dilated the IJ stick site track.  I then placed a large dilator peel-away sheath.  The dilator and J-wire were removed.  The the tip of the 28 cm palindrome catheter placed through the peel-away sheath and the sheath was peeled away.  Ultimately the catheter appeared to be too deep when I tried to pull it back I  had the cuff out of the skin on the chest wall.  At that point time I made a separate counterincision lower on the chest and then retunneled the distal end of the catheter and then pulled the cuff back even further bringing the tip into the right atrium.  At that point time we had a fair amount of venous backbleeding from around the stick site in the IJ.  I held manual pressure for about 5 minutes and this did not improve.  I am not sure if part of this was because she has a working AV loop graft in the left arm.  I then cut down on the IJ stick site where the catheter was inserted.  Ultimately placed a pursestring with 3-0 Vicryl around the catheter to get hemostasis there where it entered the IJ.  We then ran this incision closed with a additional 3-0 Vicryl and 4-0 Monocryl and skin and Dermabond was applied.  The initial counterincision on the chest wall was closed with a 4-0 Monocryl and Dermabond.  The catheter was secured with 3-0 nylons in several places.  Dermabond was applied to all locations.  Then turned my attention to the left arm.  Initially evaluated the fistula with ultrasound and attempted to place a micro access needle into the fistula to perform fistulogram to evaluate where there was active extravasation.  Ultimately this arm graft had been stented on multiple occasions and I found very difficult to get access.  Ultimately this was aborted.  Finally it became apparent that her arm hematoma gotten much larger since initial evaluating her this morning.  I then made a transverse incision over the arterial limb of the upper arm loop graft to get proximal control given I was not exactly sure where the bleeding in the graft was coming from.  Once we got down with Bovie cautery and got a fistula clamp we then evacuated about 700 mL of hematoma from the left arm.  No active bleeding was encountered other than just oozing soft tissue throughout the hematoma cavity.  We came off clamps and still had no  active bleeding.  At that point time I elected tissue to fistulogram now that I had a soft place in the graft to stick were had cutdown.  This was accessed with a micro access needle, microwire was placed, and a micro sheath.  We then used fluoroscopic C arm and a left upper arm fistulogram was obtained.  I do not see any extravasation.  The graft looked heavily diseased throughout its course and had multiple stents within the graft.  I am not sure that this is going to be a viable option long-term.  There was flow in the graft.  Ultimately we irrigated out the hematoma cavity.  We looked again extensively and did not see anything actively bleeding other than just raw surface oozing everywhere.  At that point time we closed the incision with 3-0 Vicryl the skin was closed with nylons in interrupted fashion and a pressure dressing is applied to the left arm.  Condition: Stable  Complication: None  [name], MD  [facility] Office: [phone] Pager: [phone]

## 2019-09-24 NOTE — Transfer of Care (Signed)
Immediate Anesthesia Transfer of Care Note  Patient: [name]  Procedure(s) Performed: EVACUATION HEMATOMA  LEFT ARM (Left Arm Upper) INSERTION OF TUNNELLED DIALYSIS CATHETER - PALINDROME 15FR X 28CM (Left Neck) FISTULOGRAM LEFT ARM (Left Arm Upper) Ultrasound Guidance For Vascular Access  Patient Location: PACU  Anesthesia Type:General  Level of Consciousness: awake, alert  and oriented  Airway & Oxygen Therapy: Patient Spontanous Breathing and Patient connected to nasal cannula oxygen  Post-op Assessment: Report given to RN and Post -op Vital signs reviewed and stable  Post vital signs: Reviewed and stable  Last Vitals:  Vitals Value Taken Time  BP 131/92   Temp    Pulse 96   Resp 13   SpO2 100     Last Pain:  Vitals:   09/24/19 1505  TempSrc:   PainSc: 0-No pain         Complications: No apparent anesthesia complications

## 2019-09-24 NOTE — Progress Notes (Signed)
Lab called with critical HGB 5.0. Per Dr. Ola Spurr, HGB was drawn at the end of the case via iStat and was 7.5. Orders to have H&H redrawn to confirm prior to initiating blood transfusion. Lab will see patient at bedside to redraw H&H.

## 2019-09-24 NOTE — H&P (Addendum)
Date: 09/24/2019               Patient Name:  [name] MRN: [id number]  DOB: 04/28/1976 Age / Sex: 43 y.o., female   PCP: Patient, No Pcp Per              Medical Service: Internal Medicine Teaching Service              Attending Physician: Dr. Heber , Rachel Moulds, DO    First Contact: Carolyne Littles, Medical Student  Pager: [phone]  Second Contact: Dr. Madalyn Rob Pager: [phone]  Third Contact Dr. Modena Nunnery Pager: [phone]       After Hours (After 5p/  First Contact Pager: [phone]  weekends / holidays): Second Contact Pager: [phone]   Chief Complaint: Left arm pain/swelling  History of Present Illness:  [name] is a 43 year old female with past medical history of end-stage renal disease with Left AV graft placement and later revision (04/03/19), lupus, hypertension, and hyperlipidemia presenting to [facility] ED due to left arm pain and swelling. Patient was receiving hemodialysis today at 8 am when she the nurse adjusted the needle. She felt the sudden onset of pain and her arm began to swell. The swelling continued to increase however she cannot tell if it is still growing or not. She thinks it is.  Patient describes the pain as a severe stinging pain that does not radiate down her arm or up her neck. She also relates that a similar incident occurred several years prior in which the pain and swelling are exactly as she is describing now.  Patient stated that she did not complete dialysis this morning as this occurred 1 hour into treatment. She also notes that her lupus is well-controlled with prednisone. Her only symptoms include her kidney disease and skin associated findings. She points out that her feet are currently swollen but that usually subsides following her dialysis treatment.     ED course: Evaluated the patient and noted increasing size of left arm. Patient denied distal changes to sensation or loss of function.  Controlled pain with IV narcotics  (fentanyl 25 mcg injection). Consulted both Nephrology and Vascular Surgery who evaluated patient, and recommended surgical intervention and dialysis, and are following.   Meds:  Current Meds  Medication Sig   calcium acetate, Phos Binder, (PHOSLYRA) 667 MG/5ML SOLN Take 667 mg by mouth 3 (three) times daily with meals.    diltiazem (TIAZAC) 240 MG 24 hr capsule Take 240 mg by mouth at bedtime.    hydrALAZINE (APRESOLINE) 25 MG tablet TAKE 1 TABLET BY MOUTH TWICE DAILY (Patient taking differently: Take 25 mg by mouth 2 (two) times daily. )   lisinopril (PRINIVIL,ZESTRIL) 40 MG tablet Take 40 mg by mouth at bedtime.    pantoprazole (PROTONIX) 40 MG tablet Take 1 tablet (40 mg total) by mouth daily.   PARoxetine (PAXIL) 30 MG tablet Take 30 mg by mouth daily.   predniSONE (DELTASONE) 10 MG tablet Take 1 tablet (10 mg total) by mouth daily with breakfast.   traZODone (DESYREL) 50 MG tablet Take 0.5-1 tablets (25-50 mg total) by mouth at bedtime. (Patient taking differently: Take 50 mg by mouth at bedtime. )    Allergies: Allergies as of 09/24/2019 - Review Complete 09/24/2019  Allergen Reaction Noted   Amlodipine Hives 10/09/2013   Sulfa antibiotics Hives and Itching 10/09/2013   Vancomycin Hives and Itching 10/09/2013   Venlafaxine Other (See Comments) 09/16/2019   Past Medical  History:  Diagnosis Date   Allergy    Anemia    Arthritis    Avascular necrosis of bone (HCC)    left tibial talus due to chronic prednisone use   ESRD (end stage renal disease) on dialysis ([place])    tthsat [facility] [place]   GERD (gastroesophageal reflux disease)    Headache(784.0)    Hyperlipemia    Hypertension    Lupus ([place])    Secondary hyperparathyroidism (of renal origin)     Family History:  Mother- deceased due to MI at age 60, known heart disease due to alcohol Father- alive, healthy Sister- asthma  Social History:  Patient denies alcohol usage. Patient does not smoke. Patient denies  IVDU  Review of Systems: A complete ROS was negative except as per HPI.   Vitals: Blood pressure 118/84, pulse 83, temperature 98 F (36.7 C), temperature source Oral, resp. rate 16, height 5\' 4"  (1.626 m), weight 62 kg, last menstrual period 09/14/2019, SpO2 98 %.   Physical Exam Constitutional:      General: She is in acute distress.     Appearance: She is diaphoretic.  HENT:     Head: Normocephalic and atraumatic. No contusion or masses.  Eyes:     General: Lids are normal.     Conjunctiva/sclera: Conjunctivae normal.     Pupils: Pupils are equal, round, and reactive to light.  Neck:     Musculoskeletal: Normal range of motion and neck supple. No edema, erythema or neck rigidity.     Thyroid: No thyroid mass or thyromegaly.  Cardiovascular:     Rate and Rhythm: Normal rate and regular rhythm.     Pulses: Normal pulses.     Heart sounds: Normal heart sounds, S1 normal and S2 normal. No murmur. No friction rub. No gallop.   Pulmonary:     Effort: Pulmonary effort is normal.     Breath sounds: Normal breath sounds. No stridor. No decreased breath sounds, wheezing, rhonchi or rales.  Abdominal:     General: Abdomen is flat. Bowel sounds are normal. There is no distension.     Palpations: Abdomen is soft.     Tenderness: There is no abdominal tenderness.  Musculoskeletal:     Left elbow: She exhibits swelling, effusion and deformity. Tenderness found. Medial epicondyle tenderness noted.     Right lower leg: 1+ Pitting Edema present.     Left lower leg: 1+ Pitting Edema present.     Comments: Patient resistant to motion of left arm due to pain.  Distal pulses of left extremity normal. Denies numbness tingling of left hand.   Skin:    General: Skin is warm.  Neurological:     General: No focal deficit present.     Mental Status: She is alert and oriented to person, place, and time.    CBC    Component Value Date/Time   WBC 19.0 (H) 09/24/2019 0954   RBC 2.78 (L)  09/24/2019 0954   HGB 8.2 (L) 09/24/2019 0954   HCT 25.9 (L) 09/24/2019 0954   PLT 130 (L) 09/24/2019 0954   MCV 93.2 09/24/2019 0954   MCH 29.5 09/24/2019 0954   MCHC 31.7 09/24/2019 0954   RDW 17.4 (H) 09/24/2019 0954   LYMPHSABS 0.4 (L) 09/24/2019 0954   MONOABS 0.9 09/24/2019 0954   EOSABS 0.0 09/24/2019 0954   BASOSABS 0.1 09/24/2019 0954   CMP     Component Value Date/Time   NA 135 09/24/2019 0954   K 5.2 (  H) 09/24/2019 0954   CL 97 (L) 09/24/2019 0954   CO2 25 09/24/2019 0954   GLUCOSE 178 (H) 09/24/2019 0954   BUN 50 (H) 09/24/2019 0954   CREATININE 8.08 (H) 09/24/2019 0954   CALCIUM 8.9 09/24/2019 0954   CALCIUM 8.5 06/21/2011 0622   PROT 5.7 (L) 09/24/2019 0954   ALBUMIN 2.9 (L) 09/24/2019 0954   AST 15 09/24/2019 0954   ALT 17 09/24/2019 0954   ALKPHOS 73 09/24/2019 0954   BILITOT 0.4 09/24/2019 0954   GFRNONAA 6 (L) 09/24/2019 0954   GFRAA 6 (L) 09/24/2019 0954    EKG: personally reviewed my interpretation is sinus rhythm with possible Left Axis deviation and a RBBB  CXR: none obtained  Assessment & Plan by Problem: Ms. Montecalvo is a 43 year old female with past medical history of end-stage renal disease with Left AV fistula placement and revision (04/03/19), lupus, hypertension, and hyperlipidemia presenting to [facility] ED due to left arm pain and swelling that occurred during dialysis concerning for infiltrated dialysis graft.   Active Problems:    AV graft infiltration/Hematoma of arm, left, initial encounter  -ongoing dialysis with subsequent acute pain/swelling following needle repositioning is consistent with infiltrated dialysis graft.  -Patient has normocytic anemia with Hb 8.2 suspicious of acute blood loss due to extravasation within the left arm compartment. Plan: -monitor patient and distal pulses for compartment syndrome -continue to monitor Hb, if drops <7 transfuse -continue to control pain (currenlty 1 mg dilaudid) -consulted vascular  surgery. Per their recommendations, patient will most likely undergo surgery for washout of hematoma and attempted repair of graft. 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  -Patient had undergone 1 hour of dialysis prior to acute injury. Hyperkalemia is noted on labs. Baseline creatinine appears to be around 8 Plan: -Nephrology consulted. Plan is for placement of tunneled catheter possibly later today and completion of dialysis.   Lupus  Patient states she manages lupus with prednisone and that it is well controlled. Her only symptoms include her kidney disease and skin associated findings. She does have history fragility fracture of ankle due to chronic steroid usage.  Plan: -continue patient's prednisone 10 mg with breakfast  Leukocytosis -patient has elevated WBC of 19.0 -given patient's stable vital signs/lack of fever/blood loss/acute blood loss/acute event infection is not supsected Plan:  -will continue to monitor patient's CBC over time as this acute event resolves  Dispo: Admit patient to Observation with expected length of stay less than 2 midnights.  Signed: Carolyne Littles, Medical Student 09/24/2019, 1:28 PM  [phone]  Attestation for Student Documentation:  I personally was present and performed or re-performed the history, physical exam and medical decision-making activities of this service and have verified that the service and findings are accurately documented in the students note.  Madalyn Rob, MD 09/25/2019, 3:32 PM

## 2019-09-24 NOTE — Anesthesia Procedure Notes (Signed)
Procedure Name: LMA Insertion Date/Time: 09/24/2019 4:49 PM Performed by: Janace Litten, CRNA Pre-anesthesia Checklist: Patient identified, Emergency Drugs available, Suction available and Patient being monitored Patient Re-evaluated:Patient Re-evaluated prior to induction Oxygen Delivery Method: Circle System Utilized Preoxygenation: Pre-oxygenation with 100% oxygen Induction Type: IV induction Ventilation: Mask ventilation without difficulty LMA: LMA inserted LMA Size: 4.0 Number of attempts: 1 Placement Confirmation: positive ETCO2 Tube secured with: Tape Dental Injury: Teeth and Oropharynx as per pre-operative assessment

## 2019-09-24 NOTE — ED Triage Notes (Signed)
Pt [facility] for an infiltrated dialysis graft. Pt reports this is not the first time that this has happened to her. Pt reports she is feeling a stinging pain in her left upper arm, the graft site. EMS reports the pt passed out right after it happened but remained alert and oriented for them while in route. EMS performed no interventions and they report vital signs were stable.

## 2019-09-24 NOTE — Anesthesia Preprocedure Evaluation (Addendum)
Anesthesia Evaluation  Patient identified by MRN, date of birth, ID band Patient awake    Reviewed: Allergy & Precautions, NPO status , Patient's Chart, lab work & pertinent test results  Airway Mallampati: II  TM Distance: >3 FB Neck ROM: Full    Dental  (+) Teeth Intact, Dental Advisory Given,    Pulmonary neg pulmonary ROS,    breath sounds clear to auscultation       Cardiovascular hypertension, Pt. on medications  Rhythm:Regular Rate:Normal  ECG: Sinus rhythm RBBB and LAFB   Neuro/Psych  Headaches, PSYCHIATRIC DISORDERS    GI/Hepatic Neg liver ROS, PUD, GERD  Medicated,  Endo/Other  negative endocrine ROS  Renal/GU ESRF and DialysisRenal disease     Musculoskeletal negative musculoskeletal ROS (+)   Abdominal   Peds  Hematology  (+) anemia ,   Anesthesia Other Findings vascular access problem  Reproductive/Obstetrics                            Anesthesia Physical Anesthesia Plan  ASA: III  Anesthesia Plan: General   Post-op Pain Management:    Induction: Intravenous  PONV Risk Score and Plan: Ondansetron  Airway Management Planned: LMA  Additional Equipment:   Intra-op Plan:   Post-operative Plan:   Informed Consent: I have reviewed the patients History and Physical, chart, labs and discussed the procedure including the risks, benefits and alternatives for the proposed anesthesia with the patient or authorized representative who has indicated his/her understanding and acceptance.     Dental advisory given  Plan Discussed with: CRNA and Anesthesiologist  Anesthesia Plan Comments:         Anesthesia Quick Evaluation

## 2019-09-24 NOTE — Consult Note (Signed)
Hospital Consult    Reason for Consult: Infiltration of left upper arm AV graft and dialysis Referring Physician: ED MRN #:  [id number]  History of Present Illness: This is a 43 y.o. female with history of end-stage renal disease, hypertension, hyperlipidemia, lupus that presents from her dialysis center for infiltration of left upper arm AV graft.  Patient states the graft was working fine until this morning when one of the needles came out and she noticed significant swelling and pain in her left arm.  She was subsequently transferred from [facility] down to [facility], ED.  She states the pain in her arm is 10 out of 10.  She is not sure if she completed her dialysis session today.  Patient has a left upper arm AV loop graft.  This was recently revised by Dr. 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  Past Medical History:  Diagnosis Date   Allergy    Anemia    Arthritis    Avascular necrosis of bone (HCC)    left tibial talus due to chronic prednisone use   ESRD (end stage renal disease) on dialysis ([place])    tthsat [facility]   GERD (gastroesophageal reflux disease)    Headache(784.0)    Hyperlipemia    Hypertension    Lupus ([place])    Secondary hyperparathyroidism (of renal origin)     Past Surgical History:  Procedure Laterality Date   A/V FISTULAGRAM Left 12/02/2017   Procedure: A/V FISTULAGRAM - Left upper;  Surgeon: Angelia Mould, MD;  Location: [place] [facility];  Service: Cardiovascular;  Laterality: Left;   AV FISTULA PLACEMENT Left    AV FISTULA PLACEMENT Right 01/26/2014   Procedure: ARTERIOVENOUS (AV) FISTULA CREATION; ultrasound guided;  Surgeon: Conrad [place], MD;  Location: [place];  Service: Vascular;  Laterality: Right;   ESOPHAGOGASTRODUODENOSCOPY N/A 03/17/2016   Procedure: ESOPHAGOGASTRODUODENOSCOPY (EGD);  Surgeon: Irene Shipper, MD;  Location: [facility] ENDOSCOPY;  Service: Endoscopy;  Laterality:  N/A;   HEMATOMA EVACUATION Left 10/09/2013   Procedure: EVACUATION HEMATOMA;  Surgeon: Angelia Mould, MD;  Location: [place];  Service: Vascular;  Laterality: Left;   INCISION AND DRAINAGE ABSCESS     gluteal   INSERTION OF DIALYSIS CATHETER N/A 10/09/2013   Procedure: INSERTION OF DIALYSIS CATHETER; ULTRASOUND GUIDED;  Surgeon: Angelia Mould, MD;  Location: [place];  Service: Vascular;  Laterality: N/A;   PERIPHERAL VASCULAR BALLOON ANGIOPLASTY Left 12/02/2017   Procedure: PERIPHERAL VASCULAR BALLOON ANGIOPLASTY;  Surgeon: Angelia Mould, MD;  Location: [place] [facility];  Service: Cardiovascular;  Laterality: Left;   REVISION OF ARTERIOVENOUS GORETEX GRAFT Left 04/03/2019   Procedure: REVISION OF ARTERIOVENOUS GORETEX GRAFT PSEUDOANEURYSM;  Surgeon: Angelia Mould, MD;  Location: [place];  Service: Vascular;  Laterality: Left;   SHUNTOGRAM N/A 01/07/2014   Procedure: fistulogram with possibe venoplasty left upper arm avg;  Surgeon: Angelia Mould, MD;  Location: [facility] CATH LAB;  Service: Cardiovascular;  Laterality: N/A;   tibiocalcaneal fusion  left Left     Allergies  Allergen Reactions   Amlodipine Hives    Swelling   Sulfa Antibiotics Hives and Itching   Vancomycin Hives and Itching    Prior to Admission medications   Medication Sig Start Date End Date Taking? Authorizing Provider  acetaminophen (TYLENOL) 500 MG tablet Take 500 mg by mouth daily as needed for headache.     [provider]  calcium acetate,  Phos Binder, (PHOSLYRA) 667 MG/5ML SOLN Take 667 mg by mouth.    [provider]  diltiazem (TIAZAC) 240 MG 24 hr capsule Take 240 mg by mouth daily.    [provider]  hydrALAZINE (APRESOLINE) 25 MG tablet TAKE 1 TABLET BY MOUTH TWICE DAILY 06/25/19   Herminio Commons, MD  lisinopril (PRINIVIL,ZESTRIL) 40 MG tablet Take 80 mg by mouth at bedtime.  10/15/17   [provider]  multivitamin (RENA-VIT)  TABS tablet Take 1 tablet by mouth daily.  03/30/19   [provider]  pantoprazole (PROTONIX) 40 MG tablet Take 1 tablet (40 mg total) by mouth daily. 12/04/17   Hoyt Koch, MD  PARoxetine (PAXIL) 30 MG tablet Take 30 mg by mouth daily.    [provider]  predniSONE (DELTASONE) 10 MG tablet Take 1 tablet (10 mg total) by mouth daily with breakfast. 08/29/17   Hoyt Koch, MD  traZODone (DESYREL) 50 MG tablet Take 0.5-1 tablets (25-50 mg total) by mouth at bedtime. Patient taking differently: Take 50 mg by mouth at bedtime.  08/29/17   Hoyt Koch, MD    Social History   Socioeconomic History   Marital status: Single    Spouse name: none   Number of children: 0   Years of education: 14   Highest education level: Some college, no degree  Occupational History   Occupation: unemployed    Comment: disability  Scientist, product/process development strain: Not very hard   Food insecurity    Worry: Never true    Inability: Never true   Transportation needs    Medical: No    Non-medical: No  Tobacco Use   Smoking status: Never Smoker   Smokeless tobacco: Never Used  Substance and Sexual Activity   Alcohol use: No   Drug use: No   Sexual activity: Not Currently  Lifestyle   Physical activity    Days per week: 0 days    Minutes per session: 0 min   Stress: Rather much  Relationships   Social connections    Talks on phone: More than three times a week    Gets together: More than three times a week    Attends religious service: Not on file    Active member of club or organization: No    Attends meetings of clubs or organizations: Never    Relationship status: Never married   Intimate partner violence    Fear of current or ex partner: Not on file    Emotionally abused: Not on file    Physically abused: Not on file    Forced sexual activity: Not on file  Other Topics Concern   Not on file  Social History Narrative     Lives alone   [place] to [place] 4 months ago to be closer to family   Previously lived in [place] , more active   Very close to sister [place]     Family History  Problem Relation Age of Onset   Asthma Sister    Hypertension Mother    Heart attack Mother 42       died at 68   COPD Maternal Uncle        prostate   Mental illness Neg Hx     ROS: [x]  Positive   [ ]  Negative   [ ]  All sytems reviewed and are negative  Cardiovascular: []  chest pain/pressure []  palpitations []  SOB lying flat []  DOE []  pain in  legs while walking []  pain in legs at rest []  pain in legs at night []  non-healing ulcers []  hx of DVT []  swelling in legs  Pulmonary: []  productive cough []  asthma/wheezing []  home O2  Neurologic: []  weakness in []  arms []  legs []  numbness in []  arms []  legs []  hx of CVA []  mini stroke [] difficulty speaking or slurred speech []  temporary loss of vision in one eye []  dizziness  Hematologic: []  hx of cancer []  bleeding problems []  problems with blood clotting easily  Endocrine:   []  diabetes []  thyroid disease  GI []  vomiting blood []  blood in stool  GU: []  CKD/renal failure []  HD--[]  M/W/F or []  T/T/S []  burning with urination []  blood in urine  Psychiatric: []  anxiety []  depression  Musculoskeletal: []  arthritis []  joint pain  Integumentary: []  rashes []  ulcers  Constitutional: []  fever []  chills   Physical Examination  Vitals:   09/24/19 0945 09/24/19 1004  BP: (!) 144/92 118/84  Pulse: 83   Resp: 13 16  Temp:    SpO2: 98%    Body mass index is 23.46 kg/m.  General:  WDWN in NAD Gait: Not observed HENT: WNL, normocephalic Pulmonary: normal non-labored breathing, without Rales, rhonchi,  wheezing Cardiac: regular, without  Murmurs, rubs or gallops Abdomen: soft, NT/ND, no masses Vascular Exam/Pulses: Large left arm hematoma in the upper arm.  Left arm is at least 3-4 times the size of the right arm.  Palpable pulse  at the wrist.  There is a thrill in the graft. Musculoskeletal: no muscle wasting or atrophy  Neurologic: A&O X 3; Appropriate Affect ; SENSATION: normal; MOTOR FUNCTION:  moving all extremities equally. Speech is fluent/normal       CBC    Component Value Date/Time   WBC 19.0 (H) 09/24/2019 0954   RBC 2.78 (L) 09/24/2019 0954   HGB 8.2 (L) 09/24/2019 0954   HCT 25.9 (L) 09/24/2019 0954   PLT 130 (L) 09/24/2019 0954   MCV 93.2 09/24/2019 0954   MCH 29.5 09/24/2019 0954   MCHC 31.7 09/24/2019 0954   RDW 17.4 (H) 09/24/2019 0954   LYMPHSABS 0.4 (L) 09/24/2019 0954   MONOABS 0.9 09/24/2019 0954   EOSABS 0.0 09/24/2019 0954   BASOSABS 0.1 09/24/2019 0954    BMET    Component Value Date/Time   NA 135 09/24/2019 0954   K 5.2 (H) 09/24/2019 0954   CL 97 (L) 09/24/2019 0954   CO2 25 09/24/2019 0954   GLUCOSE 178 (H) 09/24/2019 0954   BUN 50 (H) 09/24/2019 0954   CREATININE 8.08 (H) 09/24/2019 0954   CALCIUM 8.9 09/24/2019 0954   CALCIUM 8.5 06/21/2011 0622   GFRNONAA 6 (L) 09/24/2019 0954   GFRAA 6 (L) 09/24/2019 0954    COAGS: Lab Results  Component Value Date   INR 1.20 03/17/2016   INR 1.02 06/14/2011   INR 0.9 09/01/2008     Non-Invasive Vascular Imaging:    Pending left arm duplex   ASSESSMENT/PLAN: This is a 43 y.o. female with infiltration of left upper arm AV loop graft during her dialysis session this am.  She has a significant left arm hematoma and is in a lot of pain.  I recommended proceeding to the operating room for washout of her hematoma as well as attempted repair of the graft.  Options would be endovascular repair with a covered stent versus open repair.  Worse case scenario we would have to ligate her graft.  We will also likely  have to place tunneled dialysis catheter.  [name], MD [facility] Office: [phone] Pager: [phone]

## 2019-09-24 NOTE — ED Notes (Signed)
Pt stated that she does not produce urine.  

## 2019-09-25 ENCOUNTER — Other Ambulatory Visit: Payer: Self-pay

## 2019-09-25 ENCOUNTER — Encounter (HOSPITAL_COMMUNITY): Payer: Self-pay | Admitting: *Deleted

## 2019-09-25 DIAGNOSIS — Z20828 Contact with and (suspected) exposure to other viral communicable diseases: Secondary | ICD-10-CM | POA: Diagnosis present

## 2019-09-25 DIAGNOSIS — I120 Hypertensive chronic kidney disease with stage 5 chronic kidney disease or end stage renal disease: Secondary | ICD-10-CM | POA: Diagnosis present

## 2019-09-25 DIAGNOSIS — T82868A Thrombosis of vascular prosthetic devices, implants and grafts, initial encounter: Principal | ICD-10-CM

## 2019-09-25 DIAGNOSIS — I5033 Acute on chronic diastolic (congestive) heart failure: Secondary | ICD-10-CM | POA: Diagnosis not present

## 2019-09-25 DIAGNOSIS — Z9889 Other specified postprocedural states: Secondary | ICD-10-CM | POA: Diagnosis not present

## 2019-09-25 DIAGNOSIS — Z7902 Long term (current) use of antithrombotics/antiplatelets: Secondary | ICD-10-CM | POA: Diagnosis not present

## 2019-09-25 DIAGNOSIS — Y832 Surgical operation with anastomosis, bypass or graft as the cause of abnormal reaction of the patient, or of later complication, without mention of misadventure at the time of the procedure: Secondary | ICD-10-CM | POA: Diagnosis present

## 2019-09-25 DIAGNOSIS — F329 Major depressive disorder, single episode, unspecified: Secondary | ICD-10-CM | POA: Diagnosis present

## 2019-09-25 DIAGNOSIS — Z7952 Long term (current) use of systemic steroids: Secondary | ICD-10-CM | POA: Diagnosis not present

## 2019-09-25 DIAGNOSIS — D631 Anemia in chronic kidney disease: Secondary | ICD-10-CM | POA: Diagnosis present

## 2019-09-25 DIAGNOSIS — Z888 Allergy status to other drugs, medicaments and biological substances status: Secondary | ICD-10-CM | POA: Diagnosis not present

## 2019-09-25 DIAGNOSIS — E785 Hyperlipidemia, unspecified: Secondary | ICD-10-CM

## 2019-09-25 DIAGNOSIS — Z992 Dependence on renal dialysis: Secondary | ICD-10-CM

## 2019-09-25 DIAGNOSIS — S40022A Contusion of left upper arm, initial encounter: Secondary | ICD-10-CM | POA: Diagnosis present

## 2019-09-25 DIAGNOSIS — N2581 Secondary hyperparathyroidism of renal origin: Secondary | ICD-10-CM | POA: Diagnosis present

## 2019-09-25 DIAGNOSIS — M3214 Glomerular disease in systemic lupus erythematosus: Secondary | ICD-10-CM | POA: Diagnosis present

## 2019-09-25 DIAGNOSIS — Z8249 Family history of ischemic heart disease and other diseases of the circulatory system: Secondary | ICD-10-CM | POA: Diagnosis not present

## 2019-09-25 DIAGNOSIS — F339 Major depressive disorder, recurrent, unspecified: Secondary | ICD-10-CM

## 2019-09-25 DIAGNOSIS — M329 Systemic lupus erythematosus, unspecified: Secondary | ICD-10-CM | POA: Diagnosis not present

## 2019-09-25 DIAGNOSIS — E8889 Other specified metabolic disorders: Secondary | ICD-10-CM | POA: Diagnosis present

## 2019-09-25 DIAGNOSIS — N186 End stage renal disease: Secondary | ICD-10-CM | POA: Diagnosis present

## 2019-09-25 DIAGNOSIS — Z825 Family history of asthma and other chronic lower respiratory diseases: Secondary | ICD-10-CM | POA: Diagnosis not present

## 2019-09-25 DIAGNOSIS — Z79899 Other long term (current) drug therapy: Secondary | ICD-10-CM

## 2019-09-25 DIAGNOSIS — Z882 Allergy status to sulfonamides status: Secondary | ICD-10-CM | POA: Diagnosis not present

## 2019-09-25 DIAGNOSIS — M79602 Pain in left arm: Secondary | ICD-10-CM | POA: Diagnosis not present

## 2019-09-25 DIAGNOSIS — Z881 Allergy status to other antibiotic agents status: Secondary | ICD-10-CM | POA: Diagnosis not present

## 2019-09-25 DIAGNOSIS — D509 Iron deficiency anemia, unspecified: Secondary | ICD-10-CM | POA: Diagnosis not present

## 2019-09-25 DIAGNOSIS — D62 Acute posthemorrhagic anemia: Secondary | ICD-10-CM | POA: Diagnosis present

## 2019-09-25 DIAGNOSIS — E875 Hyperkalemia: Secondary | ICD-10-CM | POA: Diagnosis present

## 2019-09-25 DIAGNOSIS — K219 Gastro-esophageal reflux disease without esophagitis: Secondary | ICD-10-CM | POA: Diagnosis present

## 2019-09-25 LAB — RENAL FUNCTION PANEL
Albumin: 3.3 g/dL — ABNORMAL LOW (ref 3.5–5.0)
Anion gap: 14 (ref 5–15)
BUN: 53 mg/dL — ABNORMAL HIGH (ref 6–20)
CO2: 23 mmol/L (ref 22–32)
Calcium: 8.3 mg/dL — ABNORMAL LOW (ref 8.9–10.3)
Chloride: 95 mmol/L — ABNORMAL LOW (ref 98–111)
Creatinine, Ser: 6.58 mg/dL — ABNORMAL HIGH (ref 0.44–1.00)
GFR calc Af Amer: 8 mL/min — ABNORMAL LOW (ref >60)
GFR calc non Af Amer: 7 mL/min — ABNORMAL LOW (ref >60)
Glucose, Bld: 191 mg/dL — ABNORMAL HIGH (ref 70–99)
Phosphorus: 5.2 mg/dL — ABNORMAL HIGH (ref 2.5–4.6)
Potassium: 5.7 mmol/L — ABNORMAL HIGH (ref 3.5–5.1)
Sodium: 132 mmol/L — ABNORMAL LOW (ref 135–145)

## 2019-09-25 LAB — POCT I-STAT EG7
Acid-base deficit: 3 mmol/L — ABNORMAL HIGH (ref 0.0–2.0)
Bicarbonate: 24.3 mmol/L (ref 20.0–28.0)
Calcium, Ion: 1.07 mmol/L — ABNORMAL LOW (ref 1.15–1.40)
HCT: 22 % — ABNORMAL LOW (ref 36.0–46.0)
Hemoglobin: 7.5 g/dL — ABNORMAL LOW (ref 12.0–15.0)
O2 Saturation: 65 %
Patient temperature: 36
Potassium: 6.6 mmol/L (ref 3.5–5.1)
Sodium: 134 mmol/L — ABNORMAL LOW (ref 135–145)
TCO2: 26 mmol/L (ref 22–32)
pCO2, Ven: 51.1 mmHg (ref 44.0–60.0)
pH, Ven: 7.279 (ref 7.250–7.430)
pO2, Ven: 37 mmHg (ref 32.0–45.0)

## 2019-09-25 LAB — GLUCOSE, CAPILLARY
Glucose-Capillary: 144 mg/dL — ABNORMAL HIGH (ref 70–99)
Glucose-Capillary: 138 mg/dL — ABNORMAL HIGH (ref 70–99)
Glucose-Capillary: 266 mg/dL — ABNORMAL HIGH (ref 70–99)

## 2019-09-25 LAB — MRSA PCR SCREENING: MRSA by PCR: NEGATIVE

## 2019-09-25 LAB — HEMOGLOBIN AND HEMATOCRIT, BLOOD
HCT: 28 % — ABNORMAL LOW (ref 36.0–46.0)
Hemoglobin: 9.9 g/dL — ABNORMAL LOW (ref 12.0–15.0)

## 2019-09-25 MED ORDER — DARBEPOETIN ALFA 25 MCG/0.42ML IJ SOSY
25.0000 ug | PREFILLED_SYRINGE | INTRAMUSCULAR | Status: DC
  Filled 2019-09-25: qty 0.42

## 2019-09-25 MED ORDER — HYDROXYZINE HCL 10 MG PO TABS
10.0000 mg | ORAL_TABLET | Freq: Three times a day (TID) | ORAL | Status: DC | PRN
  Administered 2019-09-25 (×2): 10 mg via ORAL
  Filled 2019-09-25 (×2): qty 1

## 2019-09-25 MED ORDER — TRAZODONE HCL 50 MG PO TABS
50.0000 mg | ORAL_TABLET | Freq: Every day | ORAL | Status: DC
  Administered 2019-09-25: 50 mg via ORAL
  Filled 2019-09-25: qty 1

## 2019-09-25 MED ORDER — HEPARIN SODIUM (PORCINE) 1000 UNIT/ML IJ SOLN
INTRAMUSCULAR | Status: AC
  Administered 2019-09-25: 3800 [IU] via ARTERIOVENOUS_FISTULA
  Filled 2019-09-25: qty 4

## 2019-09-25 MED ORDER — CHLORHEXIDINE GLUCONATE CLOTH 2 % EX PADS
6.0000 | MEDICATED_PAD | Freq: Every day | CUTANEOUS | Status: DC
  Administered 2019-09-26: 6 via TOPICAL

## 2019-09-25 NOTE — Progress Notes (Addendum)
Patient has a rescheduled appointment at her OP HD clinic on Saturday, 09/26/19 at 12:00pm if she is medically stable for discharge in the morning on 11/7. Renal Navigator has spoken with medical team, OP HD clinic and patient about this plan. Renal Navigator has faxed medical records to OP HD clinic (fax: [phone]). Clinic can be reached at [phone].  Renal Navigator has requested of Primary team that she be evaluated early on 11/7. Renal Navigator as requested of Renal PA to fax D/C summary to OP HD clinic and cancel seat if she cannot discharge on 11/7.  Alphonzo Cruise, [place] Renal Navigator  [phone]

## 2019-09-25 NOTE — Progress Notes (Signed)
Pt blood pressure at 02:30 am 136/101,blood pressure rechecked at 02:50 141/102. Dr Sheppard Coil notified no orders given will continue to monitor

## 2019-09-25 NOTE — Anesthesia Postprocedure Evaluation (Signed)
Anesthesia Post Note  Patient: [name]  Procedure(s) Performed: EVACUATION HEMATOMA  LEFT ARM (Left Arm Upper) INSERTION OF TUNNELLED DIALYSIS CATHETER - PALINDROME 15FR X 28CM (Left Neck) FISTULOGRAM LEFT ARM (Left Arm Upper) Ultrasound Guidance For Vascular Access     Patient location during evaluation: PACU Anesthesia Type: General Level of consciousness: awake and alert Pain management: pain level controlled Vital Signs Assessment: post-procedure vital signs reviewed and stable Respiratory status: spontaneous breathing, nonlabored ventilation, respiratory function stable and patient connected to nasal cannula oxygen Cardiovascular status: blood pressure returned to baseline and stable Postop Assessment: no apparent nausea or vomiting Anesthetic complications: no    Last Vitals:  Vitals:   09/24/19 2217 09/24/19 2256  BP: 111/71 116/84  Pulse: 80 82  Resp: 11 18  Temp: 36.4 C 36.8 C  SpO2: 100% 100%    Last Pain:  Vitals:   09/25/19 0041  TempSrc:   PainSc: Tyler Deis

## 2019-09-25 NOTE — Progress Notes (Signed)
[facility]   Subjective  -states her left arm feels much better since evacuation of hematoma.   Objective (!) 147/100 84 (!) 97.5 F (36.4 C) (Oral) 20 98%  Intake/Output Summary (Last 24 hours) at 09/25/2019 1258 Last data filed at 09/25/2019 1218 Gross per 24 hour  Intake 3174 ml  Output 700 ml  Net 2474 ml    Left upper arm graft with good thrill.  No recurrent hematoma after dressing removed.  Left IJ tunneled catheter well-positioned with no hematoma.  Laboratory Lab Results: Recent Labs    09/24/19 0954 09/24/19 1932 09/24/19 2053  WBC 19.0*  --   --   HGB 8.2* 5.0* 4.9*  HCT 25.9* 16.0* 15.1*  PLT 130*  --   --    BMET Recent Labs    09/24/19 0954 09/24/19 1920  NA 135 139  K 5.2* 5.3*  CL 97* 104  CO2 25 22  GLUCOSE 178* 132*  BUN 50* 53*  CREATININE 8.08* 8.61*  CALCIUM 8.9 9.2    COAG Lab Results  Component Value Date   INR 1.20 03/17/2016   INR 1.02 06/14/2011   INR 0.9 09/01/2008   No results found for: PTT  Assessment/Planning:  Doing well postop day 1 status post placement of left IJ tunneled catheter with evacuation of left arm hematoma after infiltration of left upper arm loop graft.  Evacuated about 700 cc of blood from the arm and patient's hemoglobin dropped to 4.9 postop.  No active bleeding was identified.  Everything was irrigated out and even shot a fistulogram with no active extravasation off the graft.  Ultimately the arm was placed in a pressure dressing overnight.  Looks good today.  I will arrange follow-up in 2 to 3 weeks for wound check and suture removal.  I would continue to use her catheter until she follows up with [place] in clinic.  Vascular not actively follow this weeked and can call Dr. Donnetta Hutching if there is any questions.  [name] 09/25/2019 12:58 PM --

## 2019-09-25 NOTE — Progress Notes (Signed)
Subjective:  Saw patient this morning during rounds. Patient is sitting up in bed, eating, and doing well. Following surgery yesterday, patient has pain that is well controlled. She remarks that her arm feels much better now and that the previous stinging sensation is gone. Overnight patient received 3 units of blood due to dropping hemoglobin. Patient was asymptomatic throughout the entire process. She also remarks that whlie receiving one of the transfusions last night, there was an additional infiltration of the right arm.    Objective:  Vital signs in last 24 hours: Vitals:   09/25/19 0215 09/25/19 0230 09/25/19 0250 09/25/19 0530  BP: (!) 138/100 (!) 136/101 (!) 141/102 (!) 137/97  Pulse: 92   98  Resp: (!) 22   (!) 22  Temp: 98.7 F (37.1 C)   98.5 F (36.9 C)  TempSrc: Oral   Oral  SpO2: 95%     Weight:      Height:       Weight change:   Intake/Output Summary (Last 24 hours) at 09/25/2019 0814 Last data filed at 09/25/2019 0805 Gross per 24 hour  Intake 2480 ml  Output 700 ml  Net 1780 ml     CBC Latest Ref Rng & Units 09/24/2019 09/24/2019 09/24/2019  WBC 4.0 - 10.5 K/uL - - 19.0(H)  Hemoglobin 12.0 - 15.0 g/dL 4.9(LL) 5.0(LL) 8.2(L)  Hematocrit 36.0 - 46.0 % 15.1(L) 16.0(L) 25.9(L)  Platelets 150 - 400 K/uL - - 130(L)   BMP Latest Ref Rng & Units 09/24/2019 09/24/2019 04/03/2019  Glucose 70 - 99 mg/dL 132(H) 178(H) 117(H)  BUN 6 - 20 mg/dL 53(H) 50(H) -  Creatinine 0.44 - 1.00 mg/dL 8.61(H) 8.08(H) -  Sodium 135 - 145 mmol/L 139 135 135  Potassium 3.5 - 5.1 mmol/L 5.3(H) 5.2(H) 5.4(H)  Chloride 98 - 111 mmol/L 104 97(L) -  CO2 22 - 32 mmol/L 22 25 -  Calcium 8.9 - 10.3 mg/dL 9.2 8.9 -      Physical Exam  Constitutional: She is oriented to person, place, and time. She appears well-developed and well-nourished.  HENT:  Head: Normocephalic and atraumatic.  Eyes: Pupils are equal, round, and reactive to light. Conjunctivae are normal.  Neck:  Patient has  bandage over neck following placement of tunneled catheter in Left Internal Jugular  Cardiovascular: Normal rate, regular rhythm, S1 normal and S2 normal. Exam reveals no gallop and no friction rub.  No murmur heard. Pulmonary/Chest: Effort normal and breath sounds normal. No respiratory distress.  Abdominal: Soft. Bowel sounds are normal. She exhibits no distension.  Musculoskeletal:     Right elbow: She exhibits swelling.     Comments: -Patient has bandaged/wrapped left arm following vascular surgery yesterday -Patient has swelling of the right antecubital fossa following infiltration this morning while receiving blood transfusion  Neurological: She is alert and oriented to person, place, and time.  Skin: Skin is warm and dry.    Patient Summary [name] is an 43 y.o. female with past medical history, hypertension, hyperlipidemia ,SLE, ESRD on TTHS HD, Left AV fistula replacement and revision (04/03/19) who presented to [facility] ED on 11/5 due to infiltrated AV graft that occurred during dialysis.  Assessment/Plan:  Active Problems:   Arteriovenous fistula thrombosis (HCC)   #AVF Thrombosis # ESRD Patient had surgery yesterday with Left hematoma evacuated (700 mL), arterial limb of the left upper arm loop graft cut down, and fistulogram obtained. She additionally had a tunneled catheter placed in the left internal jugular  vein for dialysis access. Last night her hemoglobin dropped to 4.9. She received 3 units of blood. Asymptomatic on exam.  - Follow up post transfusion H&H - Consult nephrology for HD - Dilaudid .5 to 1 mg q2hrs for pain  #Anemia of ESRD - acute transfusion goal of Hgb>7 - long term >11 - follow up H&H as above   #Lupus - continue prednisione  #MDD - Continue Paxil - Continue Trazodone    LOS: 0 days   Carolyne Littles, Medical Student 09/25/2019, 8:14 AM   Attestation for Student Documentation:  I personally was present and performed or  re-performed the history, physical exam and medical decision-making activities of this service and have verified that the service and findings are accurately documented in the student's note.  Madalyn Rob, MD 09/25/2019, 1:52 PM

## 2019-09-25 NOTE — Progress Notes (Signed)
New Admission Note:   Arrival Method: stretcher from PACU Mental Orientation: alert x4 Telemetry: none Assessment: Completed Skin: intact [id number] AC infusing blood,right hand nsl Pain: none Tubes: right hemo dialysis cath Safety Measures: Safety Fall Prevention Plan has been discussed Admission: Completed [facility] Orientation: Patient has been orientated to the room, unit and staff.  Family: none at bedside  Orders have been reviewed and implemented. Will continue to monitor the patient. Call light has been placed within reach and bed alarm has been activated.   Rockie Neighbours BSN, RN Phone number: [phone] from

## 2019-09-25 NOTE — Procedures (Signed)
I was present at this dialysis session. I have reviewed the session itself and made appropriate changes.   Vital signs in last 24 hours:  Temp:  [97.3 F (36.3 C)-98.7 F (37.1 C)] 97.5 F (36.4 C) (11/06 1218) Pulse Rate:  [80-113] 84 (11/06 1218) Resp:  [11-23] 20 (11/06 1218) BP: (111-154)/(71-102) 147/100 (11/06 1218) SpO2:  [94 %-100 %] 98 % (11/06 1218) Weight change:  Filed Weights   09/24/19 1009  Weight: 62 kg    Recent Labs  Lab 09/24/19 1920  NA 139  K 5.3*  CL 104  CO2 22  GLUCOSE 132*  BUN 53*  CREATININE 8.61*  CALCIUM 9.2    Recent Labs  Lab 09/24/19 0954 09/24/19 1819 09/24/19 1932 09/24/19 2053  WBC 19.0*  --   --   --   NEUTROABS 17.1*  --   --   --   HGB 8.2* 7.5* 5.0* 4.9*  HCT 25.9* 22.0* 16.0* 15.1*  MCV 93.2  --   --   --   PLT 130*  --   --   --     Scheduled Meds: . Chlorhexidine Gluconate Cloth  6 each Topical Daily  . [START ON 09/26/2019] Chlorhexidine Gluconate Cloth  6 each Topical Q0600  . multivitamin  1 tablet Oral Daily  . pantoprazole  40 mg Oral Daily  . PARoxetine  30 mg Oral Daily  . predniSONE  10 mg Oral Q breakfast  . traZODone  50 mg Oral QHS   Continuous Infusions: PRN Meds:.HYDROmorphone (DILAUDID) injection, hydrOXYzine, oxyCODONE-acetaminophen   Donetta Potts,  MD 09/25/2019, 3:50 PM

## 2019-09-25 NOTE — Consult Note (Addendum)
[facility] Renal Consultation Note    Indication for Consultation:  Management of ESRD/hemodialysis; anemia, hypertension/volume and secondary hyperparathyroidism  HPI: [name] is a 43 y.o. female with ESRD on HD 2/2 lupus. Admitted following infiltration of LUE AVG with large painful hematoma. Underwent evacuation of hematoma and placement of left IJ dialysis catheter by Dr. Carlis Abbott 11/5.   Complicated by post-op blood loss. Hemoglobin dropped to 5.0. Transfused 3 units prbcs so far today. Labs also notable for K 6.6 >5.3. Seen in and examined at bedside. L arm feels much better, pain controlled. Denies CP, SOB, N,V,D.   Dialyzes [facility]. Last dialysis Thursday, did not complete dialysis d/t infiltration.   Past Medical History:  Diagnosis Date  . Allergy   . Anemia   . Arthritis   . Avascular necrosis of bone (HCC)    left tibial talus due to chronic prednisone use  . ESRD (end stage renal disease) on dialysis ([place])    tthsat [place]  . GERD (gastroesophageal reflux disease)   . Headache(784.0)   . Hyperlipemia   . Hypertension   . Lupus ([place])   . Secondary hyperparathyroidism (of renal origin)    Past Surgical History:  Procedure Laterality Date  . A/V FISTULAGRAM Left 12/02/2017   Procedure: A/V FISTULAGRAM - Left upper;  Surgeon: Angelia Mould, MD;  Location: [place] CV LAB;  Service: Cardiovascular;  Laterality: Left;  . AV FISTULA PLACEMENT Left   . AV FISTULA PLACEMENT Right 01/26/2014   Procedure: ARTERIOVENOUS (AV) FISTULA CREATION; ultrasound guided;  Surgeon: Conrad [place], MD;  Location: [place];  Service: Vascular;  Laterality: Right;  . ESOPHAGOGASTRODUODENOSCOPY N/A 03/17/2016   Procedure: ESOPHAGOGASTRODUODENOSCOPY (EGD);  Surgeon: Irene Shipper, MD;  Location: [facility] ENDOSCOPY;  Service: Endoscopy;  Laterality: N/A;  . FISTULOGRAM Left 09/24/2019   Procedure: FISTULOGRAM LEFT ARM;  Surgeon: [name], MD;   Location: [place];  Service: Vascular;  Laterality: Left;  . HEMATOMA EVACUATION Left 10/09/2013   Procedure: EVACUATION HEMATOMA;  Surgeon: Angelia Mould, MD;  Location: [place];  Service: Vascular;  Laterality: Left;  . HEMATOMA EVACUATION Left 09/24/2019   Procedure: EVACUATION HEMATOMA  LEFT ARM;  Surgeon: [name], MD;  Location: [place];  Service: Vascular;  Laterality: Left;  . INCISION AND DRAINAGE ABSCESS     gluteal  . INSERTION OF DIALYSIS CATHETER N/A 10/09/2013   Procedure: INSERTION OF DIALYSIS CATHETER; ULTRASOUND GUIDED;  Surgeon: Angelia Mould, MD;  Location: [place];  Service: Vascular;  Laterality: N/A;  . INSERTION OF DIALYSIS CATHETER Left 09/24/2019   Procedure: INSERTION OF TUNNELLED DIALYSIS CATHETER - PALINDROME 15FR X 28CM;  Surgeon: [name], MD;  Location: [place];  Service: Vascular;  Laterality: Left;  . PERIPHERAL VASCULAR BALLOON ANGIOPLASTY Left 12/02/2017   Procedure: PERIPHERAL VASCULAR BALLOON ANGIOPLASTY;  Surgeon: Angelia Mould, MD;  Location: [place] CV LAB;  Service: Cardiovascular;  Laterality: Left;  . REVISION OF ARTERIOVENOUS GORETEX GRAFT Left 04/03/2019   Procedure: REVISION OF ARTERIOVENOUS GORETEX GRAFT PSEUDOANEURYSM;  Surgeon: Angelia Mould, MD;  Location: [place];  Service: Vascular;  Laterality: Left;  . SHUNTOGRAM N/A 01/07/2014   Procedure: fistulogram with possibe venoplasty left upper arm avg;  Surgeon: Angelia Mould, MD;  Location: [facility] CATH LAB;  Service: Cardiovascular;  Laterality: N/A;  . tibiocalcaneal fusion  left Left   . ULTRASOUND GUIDANCE FOR VASCULAR ACCESS  09/24/2019   Procedure: Ultrasound Guidance For  Vascular Access;  Surgeon: [name], MD;  Location: [facility] OR;  Service: Vascular;;   Family History  Problem Relation Age of Onset  . Asthma Sister   . Hypertension Mother   . Heart attack Mother 21       died at 45  . COPD Maternal Uncle        prostate  .  Mental illness Neg Hx    Social History:  reports that she has never smoked. She has never used smokeless tobacco. She reports that she does not drink alcohol or use drugs. Allergies  Allergen Reactions  . Amlodipine Hives    Swelling  . Sulfa Antibiotics Hives and Itching  . Vancomycin Hives and Itching  . Venlafaxine Other (See Comments)    Emotional   Prior to Admission medications   Medication Sig Start Date End Date Taking? Authorizing Provider  calcium acetate, Phos Binder, (PHOSLYRA) 667 MG/5ML SOLN Take 667 mg by mouth 3 (three) times daily with meals.    Yes [provider]  diltiazem (TIAZAC) 240 MG 24 hr capsule Take 240 mg by mouth at bedtime.    Yes [provider]  hydrALAZINE (APRESOLINE) 25 MG tablet TAKE 1 TABLET BY MOUTH TWICE DAILY Patient taking differently: Take 25 mg by mouth 2 (two) times daily.  06/25/19  Yes Herminio Commons, MD  lisinopril (PRINIVIL,ZESTRIL) 40 MG tablet Take 40 mg by mouth at bedtime.  10/15/17  Yes [provider]  pantoprazole (PROTONIX) 40 MG tablet Take 1 tablet (40 mg total) by mouth daily. 12/04/17  Yes Hoyt Koch, MD  PARoxetine (PAXIL) 30 MG tablet Take 30 mg by mouth daily.   Yes [provider]  predniSONE (DELTASONE) 10 MG tablet Take 1 tablet (10 mg total) by mouth daily with breakfast. 08/29/17  Yes Hoyt Koch, MD  traZODone (DESYREL) 50 MG tablet Take 0.5-1 tablets (25-50 mg total) by mouth at bedtime. Patient taking differently: Take 50 mg by mouth at bedtime.  08/29/17  Yes Hoyt Koch, MD   Current Facility-Administered Medications  Medication Dose Route Frequency Provider Last Rate Last Dose  . Chlorhexidine Gluconate Cloth 2 % PADS 6 each  6 each Topical Daily Lucious Groves, DO   6 each at 09/25/19 1052  . [START ON 09/26/2019] Chlorhexidine Gluconate Cloth 2 % PADS 6 each  6 each Topical Q0600 Lynnda Child, PA-C      . HYDROmorphone (DILAUDID)  injection 0.5-1 mg  0.5-1 mg Intravenous Q2H PRN Dagoberto Ligas, PA-C   1 mg at 09/25/19 0423  . hydrOXYzine (ATARAX/VISTARIL) tablet 10 mg  10 mg Oral TID PRN Madalyn Rob, MD   10 mg at 09/25/19 0835  . multivitamin (RENA-VIT) tablet 1 tablet  1 tablet Oral Daily Dagoberto Ligas, PA-C   1 tablet at 09/25/19 1050  . oxyCODONE-acetaminophen (PERCOCET/ROXICET) 5-325 MG per tablet 1-2 tablet  1-2 tablet Oral Q4H PRN Dagoberto Ligas, PA-C   2 tablet at 09/25/19 [id number]  . pantoprazole (PROTONIX) EC tablet 40 mg  40 mg Oral Daily Dagoberto Ligas, PA-C   40 mg at 09/25/19 1050  . PARoxetine (PAXIL) tablet 30 mg  30 mg Oral Daily Dagoberto Ligas, PA-C   30 mg at 09/25/19 1050  . predniSONE (DELTASONE) tablet 10 mg  10 mg Oral Q breakfast Dagoberto Ligas, PA-C   10 mg at 09/25/19 [id number]  . traZODone (DESYREL) tablet 50 mg  50 mg Oral QHS Lucious Groves, DO  ROS: As per HPI otherwise negative.  Physical Exam: Vitals:   09/25/19 0816 09/25/19 0847 09/25/19 1201 09/25/19 1218  BP: (!) 147/99 (!) 154/101 (!) 139/100 (!) 147/100  Pulse: 95 (!) 113 96 84  Resp: (!) 21 20  20   Temp: 97.9 F (36.6 C) 97.6 F (36.4 C) (!) 97.3 F (36.3 C) (!) 97.5 F (36.4 C)  TempSrc: Oral Oral Oral Oral  SpO2: 94% 94% 98% 98%  Weight:      Height:         General: WDWN NAD Head: NCAT sclera not icteric MMM Neck: Supple. No JVD No masses Lungs: CTA bilaterally without wheezes, rales, or rhonchi. Breathing is unlabored. Heart: RRR with S1 S2 Abdomen: soft NT + BS Lower extremities: 1+-2+ LE edema L >R  Neuro: A & O  X 3. Moves all extremities spontaneously. Psych:  Responds to questions appropriately with a normal affect. Dialysis Access: LUE AVG + bruit; New L IJ TDC in place.   Labs: Basic Metabolic Panel: Recent Labs  Lab 09/24/19 0954 09/24/19 1819 09/24/19 1920  NA 135 134* 139  K 5.2* 6.6* 5.3*  CL 97*  --  104  CO2 25  --  22  GLUCOSE 178*  --  132*  BUN 50*  --  53*  CREATININE  8.08*  --  8.61*  CALCIUM 8.9  --  9.2   Liver Function Tests: Recent Labs  Lab 09/24/19 0954  AST 15  ALT 17  ALKPHOS 73  BILITOT 0.4  PROT 5.7*  ALBUMIN 2.9*   No results for input(s): LIPASE, AMYLASE in the last 168 hours. No results for input(s): AMMONIA in the last 168 hours. CBC: Recent Labs  Lab 09/24/19 0954 09/24/19 1819 09/24/19 1932 09/24/19 2053  WBC 19.0*  --   --   --   NEUTROABS 17.1*  --   --   --   HGB 8.2* 7.5* 5.0* 4.9*  HCT 25.9* 22.0* 16.0* 15.1*  MCV 93.2  --   --   --   PLT 130*  --   --   --    Cardiac Enzymes: No results for input(s): CKTOTAL, CKMB, CKMBINDEX, TROPONINI in the last 168 hours. CBG: Recent Labs  Lab 09/24/19 1903 09/25/19 0755 09/25/19 1147  GLUCAP 165* 144* 138*   Iron Studies: No results for input(s): IRON, TIBC, TRANSFERRIN, FERRITIN in the last 72 hours. Studies/Results: Dg Chest Port 1 View  Result Date: 09/24/2019 CLINICAL DATA:  Reason for exam: vascular dialysis catheter in place EXAM: PORTABLE CHEST 1 VIEW COMPARISON:  03/17/2016 FINDINGS: Interval placement of LEFT IJ dialysis catheter, tip overlying the level of the UPPER RIGHT atrium. No pneumothorax. Heart size is normal. Stable elevation of the RIGHT hemidiaphragm. There is patchy opacity at the RIGHT lung base, consistent with atelectasis or early infiltrate. No pulmonary edema. Remote rib fracture or fractures. LEFT axillary stent. IMPRESSION: 1. Interval the of LEFT IJ dialysis catheter. No pneumothorax. 2. Stable elevation of the RIGHT hemidiaphragm. 3. RIGHT lower lobe atelectasis or infiltrate. Electronically Signed   By: Nolon Nations M.D.   On: 09/24/2019 20:23   Dg Ang/ext/uni/or Left  Result Date: 09/25/2019 CLINICAL DATA:  Infiltrated left upper arm dialysis graft with hematoma evacuation. EXAM: LEFT ANG/EXT/UNI/ OR COMPARISON:  None. FINDINGS: Intraoperative imaging shows opacification of a loop graft in the left upper arm with contrast. Starting at  the apex of the graft, the venous limb is very deteriorated with multiple areas of irregular narrowing  present. No evidence of pseudoaneurysm or extravasation. The venous anastomosis and proximal venous outflow is stented and patent. At the level of the axilla there is reflux into a branch vein and there may be some degree flow restriction at the level of the axillary vein. IMPRESSION: Degenerated loop graft with multiple areas of irregular stenosis along the apex and venous limb. Patent stented venous anastomosis and proximal venous outflow. Potential flow restriction at the level of the axillary vein. Electronically Signed   By: Aletta Edouard M.D.   On: 09/25/2019 07:51   Dg Fluoro Guide Cv Line-no Report  Result Date: 09/24/2019 Fluoroscopy was utilized by the requesting physician.  No radiographic interpretation.   Vas [place] Upper Extremity Arterial Duplex  Result Date: 09/24/2019 UPPER EXTREMITY DUPLEX STUDY Indications: Patient complains of Swelling, pain. History:     Patient has a history ofLeft upper arm loop dialysis access graft.  Limitations: Tissue properties, patient pain tolerance, multiple revision              surgeries Comparison Study: No prior studies. Performing Technologist: Oliver Hum RVT  Examination Guidelines: A complete evaluation includes B-mode imaging, spectral Doppler, color Doppler, and power Doppler as needed of all accessible portions of each vessel. Bilateral testing is considered an integral part of a complete examination. Limited examinations for reoccurring indications may be performed as noted.  Left Doppler Findings: +----+----------+--------+------+--------+ SitePSV (cm/s)WaveformPlaqueComments +----+----------+--------+------+--------+ All native arteries appear patent. Possible stenosis noted in the distal, upper arm graft. Possible infiltration noted in the mid, upper arm graft.  Summary:  Left: All native arteries appear patent.       Possible stenosis  noted in the distal, upper arm graft.       Possible infiltration noted in the mid, upper arm graft. *See table(s) above for measurements and observations. Electronically signed by Monica Martinez MD on 09/24/2019 at 3:39:34 PM.    Final     Dialysis Orders:  [facility] 3H 400/800 EDW 60.5kg 2K/2.5Ca  Heparin 1800 bolus  Hectorol 1 mcg TIW  EPO 1200 TIW   Assessment/Plan: 1. L AVG infiltration/hematoma - s/p evacuation of left arm hematoma by Dr. Carlis Abbott 11/5. L IJ TDC placed. To rest fistula for 2-3 weeks.  Complicated by post-op blood loss. Transfused today.  2. ESRD -  HD TTS.  Off schedule today for volume/hypkerlamia. Back on schedule tomorrow. 3. Hypertension/volume  -  BP slightly elevated with volume on exam.  Should improve with UF today. UF to EDW as tolerated  4. Anemia - ABLA s/p hematoma evacuation. Hgb down to 4.9. Received 3 units prbcs today. Await repeat Hgb. Resume ESA with HD Sat.   5. Metabolic bone disease -  Ca ok. Continue VDRA/binders  6. Nutrition - renal diet/vitamins   Lynnda Child [facility] Pager [phone] 09/25/2019, 3:33 PM     I have seen and examined this patient and agree with plan and assessment in the above note with renal recommendations/intervention highlighted.  Will transfuse again if Hgb continues to drop.  Currently hemodynamically stable and on HD.  Hopeful discharge in am and f/u at outpatient HD tomorrow.  Mary John A Gabbrielle Mcnicholas,MD 09/25/2019 4:45 PM

## 2019-09-26 DIAGNOSIS — Z888 Allergy status to other drugs, medicaments and biological substances status: Secondary | ICD-10-CM

## 2019-09-26 DIAGNOSIS — N2581 Secondary hyperparathyroidism of renal origin: Secondary | ICD-10-CM | POA: Diagnosis not present

## 2019-09-26 DIAGNOSIS — Z992 Dependence on renal dialysis: Secondary | ICD-10-CM | POA: Diagnosis not present

## 2019-09-26 DIAGNOSIS — N186 End stage renal disease: Secondary | ICD-10-CM | POA: Diagnosis not present

## 2019-09-26 DIAGNOSIS — D509 Iron deficiency anemia, unspecified: Secondary | ICD-10-CM | POA: Diagnosis not present

## 2019-09-26 DIAGNOSIS — D631 Anemia in chronic kidney disease: Secondary | ICD-10-CM | POA: Diagnosis not present

## 2019-09-26 DIAGNOSIS — Z881 Allergy status to other antibiotic agents status: Secondary | ICD-10-CM

## 2019-09-26 LAB — BPAM RBC
Blood Product Expiration Date: 202011122359
Blood Product Expiration Date: 202012052359
Blood Product Expiration Date: 202012052359
ISSUE DATE / TIME: 202011052153
ISSUE DATE / TIME: 202011060154
ISSUE DATE / TIME: 202011060824
Unit Type and Rh: 5100
Unit Type and Rh: 5100
Unit Type and Rh: 5100

## 2019-09-26 LAB — TYPE AND SCREEN
ABO/RH(D): O POS
Antibody Screen: NEGATIVE
Unit division: 0
Unit division: 0
Unit division: 0

## 2019-09-26 LAB — RENAL FUNCTION PANEL
Albumin: 2.9 g/dL — ABNORMAL LOW (ref 3.5–5.0)
Anion gap: 15 (ref 5–15)
BUN: 43 mg/dL — ABNORMAL HIGH (ref 6–20)
CO2: 21 mmol/L — ABNORMAL LOW (ref 22–32)
Calcium: 7.9 mg/dL — ABNORMAL LOW (ref 8.9–10.3)
Chloride: 101 mmol/L (ref 98–111)
Creatinine, Ser: 6.21 mg/dL — ABNORMAL HIGH (ref 0.44–1.00)
GFR calc Af Amer: 9 mL/min — ABNORMAL LOW (ref >60)
GFR calc non Af Amer: 8 mL/min — ABNORMAL LOW (ref >60)
Glucose, Bld: 112 mg/dL — ABNORMAL HIGH (ref 70–99)
Phosphorus: 5.5 mg/dL — ABNORMAL HIGH (ref 2.5–4.6)
Potassium: 5.1 mmol/L (ref 3.5–5.1)
Sodium: 137 mmol/L (ref 135–145)

## 2019-09-26 LAB — CBC
HCT: 27.6 % — ABNORMAL LOW (ref 36.0–46.0)
Hemoglobin: 9.4 g/dL — ABNORMAL LOW (ref 12.0–15.0)
MCH: 31 pg (ref 26.0–34.0)
MCHC: 34.1 g/dL (ref 30.0–36.0)
MCV: 91.1 fL (ref 80.0–100.0)
Platelets: 74 10*3/uL — ABNORMAL LOW (ref 150–400)
RBC: 3.03 MIL/uL — ABNORMAL LOW (ref 3.87–5.11)
RDW: 15.2 % (ref 11.5–15.5)
WBC: 18.1 10*3/uL — ABNORMAL HIGH (ref 4.0–10.5)
nRBC: 1.5 % — ABNORMAL HIGH (ref 0.0–0.2)

## 2019-09-26 LAB — GLUCOSE, CAPILLARY: Glucose-Capillary: 113 mg/dL — ABNORMAL HIGH (ref 70–99)

## 2019-09-26 MED ORDER — RENA-VITE PO TABS: 1.0000 | ORAL_TABLET | Freq: Every day | ORAL | 0 refills | Status: DC

## 2019-09-26 MED ORDER — OXYCODONE-ACETAMINOPHEN 5-325 MG PO TABS: 1.0000 | ORAL_TABLET | ORAL | 0 refills | Status: AC | PRN

## 2019-09-26 NOTE — Progress Notes (Signed)
   Subjective: No acute events overnight. Patient feeling well this morning. No pain in her arm. Tolerating PO. No abdominal pain, nausea or vomiting. Plans to go to dialysis at noon today.   Objective:  Vital signs in last 24 hours: Vitals:   09/25/19 1830 09/25/19 1837 09/25/19 2018 09/26/19 0447  BP: (!) 131/91 (!) 136/94 (!) 147/106 (!) 143/97  Pulse: 93 (!) 102 (!) 104 85  Resp:  18 18 16   Temp:  98.1 F (36.7 C) 98 F (36.7 C) 97.8 F (36.6 C)  TempSrc:  Oral Oral Oral  SpO2:  98% 96% 98%  Weight:    63.9 kg  Height:       General: awake, alert, pleasant female witting up in bed in NAD CV: RRR Pulm: normal work of breathing; lungs CTAB Ext: LUE without pain, good ROM   Assessment/Plan:  Principal Problem:   Hematoma of arm, left, initial encounter Active Problems:   ESRD on dialysis ([place])   Acute blood loss anemia   AV graft malfunction (HCC)  #AVF Thrombosis # ESRD Patient had surgery 11/5 with Left hematoma evacuated (700 mL), arterial limb of the left upper arm loop graft cut down, and fistulogram obtained. She additionally had a tunneled catheter placed in the left internal jugular vein for dialysis access.  - pain well controlled - appreciate nephrology following for inpatient management  - she will follow-up with vascular in 2-3 weeks for wound check   #Acute on chronic Anemia of ESRD hemoglobin dropped to 4.9, s/p 3 units of pRBCs.  - hgb stable at 9.4   #Lupus - continue prednisione  #MDD - Continue Paxil - Continue Trazodone  Dispo: Anticipated discharge today to make it to HD at noon.   [place]ena Nunnery D, DO 09/26/2019, 7:37 AM Pager: [phone]

## 2019-09-26 NOTE — Discharge Summary (Signed)
Name: [name] MRN: [id number] DOB: 11-Oct-1976 43 y.o. PCP: Patient, No Pcp Per  Date of Admission: 09/24/2019  9:08 AM Date of Discharge: 09/26/2019 Attending Physician: Lucious Groves, DO  Discharge Diagnosis: 1. AVF Thrombosis  Discharge Medications: Allergies as of 09/26/2019      Reactions   Amlodipine Hives   Swelling   Sulfa Antibiotics Hives, Itching   Vancomycin Hives, Itching   Venlafaxine Other (See Comments)   Emotional      Medication List    TAKE these medications   calcium acetate (Phos Binder) 667 MG/5ML Soln Commonly known as: PHOSLYRA Take 667 mg by mouth 3 (three) times daily with meals.   diltiazem 240 MG 24 hr capsule Commonly known as: TIAZAC Take 240 mg by mouth at bedtime.   hydrALAZINE 25 MG tablet Commonly known as: APRESOLINE TAKE 1 TABLET BY MOUTH TWICE DAILY   lisinopril 40 MG tablet Commonly known as: ZESTRIL Take 40 mg by mouth at bedtime.   multivitamin Tabs tablet Take 1 tablet by mouth daily.   oxyCODONE-acetaminophen 5-325 MG tablet Commonly known as: PERCOCET/ROXICET Take 1-2 tablets by mouth every 4 (four) hours as needed for up to 5 days for moderate pain.   pantoprazole 40 MG tablet Commonly known as: PROTONIX Take 1 tablet (40 mg total) by mouth daily.   PARoxetine 30 MG tablet Commonly known as: PAXIL Take 30 mg by mouth daily.   predniSONE 10 MG tablet Commonly known as: DELTASONE Take 1 tablet (10 mg total) by mouth daily with breakfast.   traZODone 50 MG tablet Commonly known as: DESYREL Take 0.5-1 tablets (25-50 mg total) by mouth at bedtime. What changed: how much to take       Disposition and follow-up:   [name] was discharged from [facility] in Stable condition.  At the hospital follow up visit please address:  1.  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 Stable Hgb at discharge of 9.4.  -CBC, Hgb goal of 11 with ESRD.    2.  Labs / imaging needed at time of follow-up: CBC  3.  Pending labs/ test needing follow-up:   Follow-up Appointments: Follow-up Information    [name], MD. Schedule an appointment as soon as possible for a visit in 2 week(s).   Specialty: Vascular Surgery Contact information: [place] [address] [phone]           Hospital Course by problem list: 1. #AVF Thrombosis # ESRD Patient had surgery 11/5 with Left hematoma evacuated (700 mL), arterial limb of the left upper arm loop graft cut down, and fistulogram obtained. She additionally had a tunneled catheter placed in the left internal jugular vein for dialysis access. Follow up with vascular surgery in 2-3 weeks.   #Acute on chronic Anemia of ESRD hemoglobin dropped to 4.9, s/p 3 units of pRBCs. Hgb stable at 9.4 on day of discharge.   #Lupus - continue prednisione  #MDD - Continue Paxil - Continue Trazodone  Discharge Vitals:   BP (!) 143/97 (BP Location: Right Arm)   Pulse 85   Temp 97.8 F (36.6 C) (Oral)   Resp 16   Ht 5\' 4"  (1.626 m)   Wt  63.9 kg   LMP 09/14/2019 (Exact Date)   SpO2 98%   BMI 24.18 kg/m   Pertinent Labs, Studies, and Procedures:  CBC Latest Ref Rng & Units 09/26/2019 09/25/2019 09/24/2019  WBC 4.0 - 10.5 K/uL 18.1(H) - -  Hemoglobin 12.0 - 15.0 g/dL 9.4(L) 9.9(L) 4.9(LL)  Hematocrit 36.0 - 46.0 % 27.6(L) 28.0(L) 15.1(L)  Platelets 150 - 400 K/uL 74(L) - -   BMP Latest Ref Rng & Units 09/26/2019 09/25/2019 09/24/2019  Glucose 70 - 99 mg/dL 112(H) 191(H) 132(H)  BUN 6 - 20 mg/dL 43(H) 53(H) 53(H)  Creatinine 0.44 - 1.00 mg/dL 6.21(H) 6.58(H) 8.61(H)  Sodium 135 - 145 mmol/L 137 132(L) 139  Potassium 3.5 - 5.1 mmol/L 5.1 5.7(H) 5.3(H)  Chloride 98 - 111 mmol/L 101 95(L) 104  CO2 22 - 32 mmol/L 21(L) 23 22   Calcium 8.9 - 10.3 mg/dL 7.9(L) 8.3(L) 9.2     Discharge Instructions: Discharge Instructions    Call MD for:  redness, tenderness, or signs of infection (pain, swelling, redness, odor or green/yellow discharge around incision site)   Complete by: As directed    Call MD for:  severe uncontrolled pain   Complete by: As directed    Call MD for:  temperature >100.4   Complete by: As directed    Diet - low sodium heart healthy   Complete by: As directed    Discharge instructions   Complete by: As directed    [name], It was a pleasure taking care of you. Glad your arm is feeling better.  You have a short course of pain medication to take as needed for severe pain as you continue to recover.  Be sure to follow-up with the vascular surgeon in 2-3 weeks.   Take care!   Increase activity slowly   Complete by: As directed       Signed:  Tamsen Snider, MD PGY1  [phone]

## 2019-09-26 NOTE — Progress Notes (Signed)
DISCHARGE NOTE HOME [name] to be discharged Home per MD order. Discussed prescriptions and follow up appointments with the patient. Prescriptions given to patient; medication list explained in detail. Patient verbalized understanding.  Skin clean, dry and intact without evidence of skin break down, no evidence of skin tears noted. IV catheter discontinued intact. Site without signs and symptoms of complications. Dressing and pressure applied. Pt denies pain at the site currently. No complaints noted.  Patient free of lines, drains, and wounds.   An After Visit Summary (AVS) was printed and given to the patient. Patient escorted via wheelchair, and discharged home via private auto.  Orville Govern, RN3

## 2019-09-29 DIAGNOSIS — N186 End stage renal disease: Secondary | ICD-10-CM | POA: Diagnosis not present

## 2019-09-29 DIAGNOSIS — Z992 Dependence on renal dialysis: Secondary | ICD-10-CM | POA: Diagnosis not present

## 2019-09-29 DIAGNOSIS — D631 Anemia in chronic kidney disease: Secondary | ICD-10-CM | POA: Diagnosis not present

## 2019-09-29 DIAGNOSIS — N2581 Secondary hyperparathyroidism of renal origin: Secondary | ICD-10-CM | POA: Diagnosis not present

## 2019-09-29 DIAGNOSIS — D509 Iron deficiency anemia, unspecified: Secondary | ICD-10-CM | POA: Diagnosis not present

## 2019-10-01 DIAGNOSIS — N186 End stage renal disease: Secondary | ICD-10-CM | POA: Diagnosis not present

## 2019-10-01 DIAGNOSIS — Z992 Dependence on renal dialysis: Secondary | ICD-10-CM | POA: Diagnosis not present

## 2019-10-01 DIAGNOSIS — N2581 Secondary hyperparathyroidism of renal origin: Secondary | ICD-10-CM | POA: Diagnosis not present

## 2019-10-01 DIAGNOSIS — D631 Anemia in chronic kidney disease: Secondary | ICD-10-CM | POA: Diagnosis not present

## 2019-10-01 DIAGNOSIS — D509 Iron deficiency anemia, unspecified: Secondary | ICD-10-CM | POA: Diagnosis not present

## 2019-10-03 DIAGNOSIS — Z992 Dependence on renal dialysis: Secondary | ICD-10-CM | POA: Diagnosis not present

## 2019-10-03 DIAGNOSIS — D509 Iron deficiency anemia, unspecified: Secondary | ICD-10-CM | POA: Diagnosis not present

## 2019-10-03 DIAGNOSIS — D631 Anemia in chronic kidney disease: Secondary | ICD-10-CM | POA: Diagnosis not present

## 2019-10-03 DIAGNOSIS — N186 End stage renal disease: Secondary | ICD-10-CM | POA: Diagnosis not present

## 2019-10-03 DIAGNOSIS — N2581 Secondary hyperparathyroidism of renal origin: Secondary | ICD-10-CM | POA: Diagnosis not present

## 2019-10-06 DIAGNOSIS — N2581 Secondary hyperparathyroidism of renal origin: Secondary | ICD-10-CM | POA: Diagnosis not present

## 2019-10-06 DIAGNOSIS — N186 End stage renal disease: Secondary | ICD-10-CM | POA: Diagnosis not present

## 2019-10-06 DIAGNOSIS — Z992 Dependence on renal dialysis: Secondary | ICD-10-CM | POA: Diagnosis not present

## 2019-10-06 DIAGNOSIS — D509 Iron deficiency anemia, unspecified: Secondary | ICD-10-CM | POA: Diagnosis not present

## 2019-10-06 DIAGNOSIS — D631 Anemia in chronic kidney disease: Secondary | ICD-10-CM | POA: Diagnosis not present

## 2019-10-08 DIAGNOSIS — N2581 Secondary hyperparathyroidism of renal origin: Secondary | ICD-10-CM | POA: Diagnosis not present

## 2019-10-08 DIAGNOSIS — D631 Anemia in chronic kidney disease: Secondary | ICD-10-CM | POA: Diagnosis not present

## 2019-10-08 DIAGNOSIS — D509 Iron deficiency anemia, unspecified: Secondary | ICD-10-CM | POA: Diagnosis not present

## 2019-10-08 DIAGNOSIS — N186 End stage renal disease: Secondary | ICD-10-CM | POA: Diagnosis not present

## 2019-10-08 DIAGNOSIS — Z992 Dependence on renal dialysis: Secondary | ICD-10-CM | POA: Diagnosis not present

## 2019-10-10 DIAGNOSIS — N2581 Secondary hyperparathyroidism of renal origin: Secondary | ICD-10-CM | POA: Diagnosis not present

## 2019-10-10 DIAGNOSIS — D631 Anemia in chronic kidney disease: Secondary | ICD-10-CM | POA: Diagnosis not present

## 2019-10-10 DIAGNOSIS — D509 Iron deficiency anemia, unspecified: Secondary | ICD-10-CM | POA: Diagnosis not present

## 2019-10-10 DIAGNOSIS — Z992 Dependence on renal dialysis: Secondary | ICD-10-CM | POA: Diagnosis not present

## 2019-10-10 DIAGNOSIS — N186 End stage renal disease: Secondary | ICD-10-CM | POA: Diagnosis not present

## 2019-10-11 NOTE — Progress Notes (Signed)
POST OPERATIVE OFFICE NOTE    CC:  F/u for surgery  HPI:  This is a 43 y.o. female with history of end-stage renal disease, hypertension, hyperlipidemia, lupus that presents from her dialysis center for infiltration of left upper arm AV graft.  Patient states the graft was working fine until this morning when one of the needles came out and she noticed significant swelling and pain in her left arm.  She was subsequently transferred from [facility] down to [facility], ED.  She states the pain in her arm is 10 out of 10.  She is not sure if she completed her dialysis session today.    Patient has a left upper arm AV loop graft.  This was recently revised by Dr. 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    Dr. Carlis Abbott recommended pt be taken to the OR for washout of her hematoma as well as attempted repair of the graft and most likely placement of TDC.    Later that day, she was taken to the OR and underwent, I&D of left arm hematoma, placement of TDC on the left with 28 cm palindrome and fistulogram by Dr. Carlis Abbott.   fistulogram revealed no extravasation from the graft.  The arm was placed in a pressure dressing overnight and looked good the next morning.  She was scheduled to return to our office in 2-3 weeks for suture removal.  She presents today for follow up.    Allergies  Allergen Reactions  . Amlodipine Hives    Swelling  . Sulfa Antibiotics Hives and Itching  . Vancomycin Hives and Itching  . Venlafaxine Other (See Comments)    Emotional    Current Outpatient Medications  Medication Sig Dispense Refill  . calcium acetate, Phos Binder, (PHOSLYRA) 667 MG/5ML SOLN Take 667 mg by mouth 3 (three) times daily with meals.     Marland Kitchen diltiazem (TIAZAC) 240 MG 24 hr capsule Take 240 mg by mouth at bedtime.     . hydrALAZINE (APRESOLINE) 25 MG tablet TAKE 1 TABLET BY MOUTH TWICE DAILY (Patient taking differently: Take 25 mg by mouth 2 (two) times daily. )  30 tablet 0  . lisinopril (PRINIVIL,ZESTRIL) 40 MG tablet Take 40 mg by mouth at bedtime.     . multivitamin (RENA-VIT) TABS tablet Take 1 tablet by mouth daily. 30 tablet 0  . pantoprazole (PROTONIX) 40 MG tablet Take 1 tablet (40 mg total) by mouth daily. 30 tablet 3  . PARoxetine (PAXIL) 30 MG tablet Take 30 mg by mouth daily.    . predniSONE (DELTASONE) 10 MG tablet Take 1 tablet (10 mg total) by mouth daily with breakfast. 90 tablet 3  . traZODone (DESYREL) 50 MG tablet Take 0.5-1 tablets (25-50 mg total) by mouth at bedtime. (Patient taking differently: Take 50 mg by mouth at bedtime. ) 90 tablet 3   No current facility-administered medications for this visit.      ROS:  See HPI  Physical Exam:  Today's Vitals   10/12/19 1521  BP: (!) 142/88  Pulse: 93  Resp: 18  Temp: 98.2 F (36.8 C)  SpO2: 96%  Weight: 139 lb 6.4 oz (63.2 kg)  Height: 5\' 4"  (1.626 m)   Body mass index is 23.93 kg/m.   Incision:  Well healed with sutures in tact.  Extremities:  Graft has +thrill/bruit present; there is still significant swelling in the LUA but pt states this has improved.   Assessment/Plan:  This is a 43 y.o. female who is s/p: 1.  Ultrasound-guided access of the right internal jugular vein 2.  Ultrasound-guided access of the left internal jugular vein 3.  Placement of left internal jugular vein tunneled dialysis catheter (28 cm palindrome) 4.  Incision and drainage of left arm hematoma 5.  Left arm fistulogram  -remove sutures today as incision looks good.   -Would continue to let the graft rest for another 3-4 weeks to allow swelling to continue to improve.  Will see her back in 3-4 weeks to check LUA.     Leontine Locket, PA-C [facility] [phone]  Clinic MD:  Early (on call MD)

## 2019-10-12 ENCOUNTER — Other Ambulatory Visit: Payer: Self-pay

## 2019-10-12 ENCOUNTER — Ambulatory Visit (INDEPENDENT_AMBULATORY_CARE_PROVIDER_SITE_OTHER): Payer: Self-pay | Admitting: Physician Assistant

## 2019-10-12 VITALS — BP 142/88 | HR 93 | Temp 98.2°F | Resp 18 | Ht 64.0 in | Wt 139.4 lb

## 2019-10-12 DIAGNOSIS — N186 End stage renal disease: Secondary | ICD-10-CM

## 2019-10-12 DIAGNOSIS — Z992 Dependence on renal dialysis: Secondary | ICD-10-CM

## 2019-10-13 DIAGNOSIS — Z992 Dependence on renal dialysis: Secondary | ICD-10-CM | POA: Diagnosis not present

## 2019-10-13 DIAGNOSIS — D509 Iron deficiency anemia, unspecified: Secondary | ICD-10-CM | POA: Diagnosis not present

## 2019-10-13 DIAGNOSIS — D631 Anemia in chronic kidney disease: Secondary | ICD-10-CM | POA: Diagnosis not present

## 2019-10-13 DIAGNOSIS — N186 End stage renal disease: Secondary | ICD-10-CM | POA: Diagnosis not present

## 2019-10-13 DIAGNOSIS — N2581 Secondary hyperparathyroidism of renal origin: Secondary | ICD-10-CM | POA: Diagnosis not present

## 2019-10-15 DIAGNOSIS — Z992 Dependence on renal dialysis: Secondary | ICD-10-CM | POA: Diagnosis not present

## 2019-10-15 DIAGNOSIS — D631 Anemia in chronic kidney disease: Secondary | ICD-10-CM | POA: Diagnosis not present

## 2019-10-15 DIAGNOSIS — N186 End stage renal disease: Secondary | ICD-10-CM | POA: Diagnosis not present

## 2019-10-15 DIAGNOSIS — D509 Iron deficiency anemia, unspecified: Secondary | ICD-10-CM | POA: Diagnosis not present

## 2019-10-15 DIAGNOSIS — N2581 Secondary hyperparathyroidism of renal origin: Secondary | ICD-10-CM | POA: Diagnosis not present

## 2019-10-17 DIAGNOSIS — D631 Anemia in chronic kidney disease: Secondary | ICD-10-CM | POA: Diagnosis not present

## 2019-10-17 DIAGNOSIS — Z992 Dependence on renal dialysis: Secondary | ICD-10-CM | POA: Diagnosis not present

## 2019-10-17 DIAGNOSIS — N2581 Secondary hyperparathyroidism of renal origin: Secondary | ICD-10-CM | POA: Diagnosis not present

## 2019-10-17 DIAGNOSIS — N186 End stage renal disease: Secondary | ICD-10-CM | POA: Diagnosis not present

## 2019-10-17 DIAGNOSIS — D509 Iron deficiency anemia, unspecified: Secondary | ICD-10-CM | POA: Diagnosis not present

## 2019-10-19 DIAGNOSIS — N186 End stage renal disease: Secondary | ICD-10-CM | POA: Diagnosis not present

## 2019-10-19 DIAGNOSIS — Z992 Dependence on renal dialysis: Secondary | ICD-10-CM | POA: Diagnosis not present

## 2019-10-20 ENCOUNTER — Other Ambulatory Visit: Payer: Self-pay | Admitting: Cardiovascular Disease

## 2019-10-20 DIAGNOSIS — M329 Systemic lupus erythematosus, unspecified: Secondary | ICD-10-CM | POA: Diagnosis not present

## 2019-10-20 DIAGNOSIS — Z882 Allergy status to sulfonamides status: Secondary | ICD-10-CM | POA: Diagnosis not present

## 2019-10-20 DIAGNOSIS — Z79899 Other long term (current) drug therapy: Secondary | ICD-10-CM | POA: Diagnosis not present

## 2019-10-20 DIAGNOSIS — N186 End stage renal disease: Secondary | ICD-10-CM | POA: Diagnosis not present

## 2019-10-20 DIAGNOSIS — T82838A Hemorrhage of vascular prosthetic devices, implants and grafts, initial encounter: Secondary | ICD-10-CM | POA: Diagnosis not present

## 2019-11-02 ENCOUNTER — Other Ambulatory Visit: Payer: Self-pay

## 2019-11-02 ENCOUNTER — Ambulatory Visit (INDEPENDENT_AMBULATORY_CARE_PROVIDER_SITE_OTHER): Payer: Self-pay | Admitting: Physician Assistant

## 2019-11-02 DIAGNOSIS — Z992 Dependence on renal dialysis: Secondary | ICD-10-CM

## 2019-11-02 DIAGNOSIS — N186 End stage renal disease: Secondary | ICD-10-CM

## 2019-11-02 NOTE — Progress Notes (Signed)
POST OPERATIVE OFFICE NOTE    CC:  F/u for surgery  HPI:  This is a 43 y.o. female with history of end-stage renal disease, hypertension, hyperlipidemia, lupus that presents from her dialysis center for infiltration of left upper arm AV graft. Patient states the graft was working fine until this morning when one of the needles came out and she noticed significant swelling and pain in her left arm. [facility], ED. She states the pain in her arm is 10 out of 10. She is not sure if she completedher dialysis session today.   On 09/24/2019, she was taken to the OR and underwent: 1.  Ultrasound-guided access of the right internal jugular vein 2.  Ultrasound-guided access of the left internal jugular vein 3.  Placement of left internal jugular vein tunneled dialysis catheter (28 cm palindrome) 4.  Incision and drainage of left arm hematoma 5.  Left arm fistulogram By Dr. Carlis Abbott.   She was seen back on 10/12/2019 & at that time, her sutures were removed.  It was recommended to let the graft rest another 3-4 weeks to allow swelling to continue to improve and follow up in 3-4 weeks.  She is here today for that visit.  She states that she had a scab that came off of the back of her arm and a bunch of fluid came out.  She went to doctor and they said that was a good thing.  She states since that time, her arm swelling has significantly improved.  She still has a superficial area on the posterior aspect of her upper arm that does have some serosanguinous drainage.  This not anywhere close to the graft.  She states her catheter is working well.    Allergies  Allergen Reactions  . Amlodipine Hives    Swelling  . Sulfa Antibiotics Hives and Itching  . Vancomycin Hives and Itching  . Venlafaxine Other (See Comments)    Emotional    Current Outpatient Medications  Medication Sig Dispense Refill  . calcium acetate, Phos Binder,  (PHOSLYRA) 667 MG/5ML SOLN Take 667 mg by mouth 3 (three) times daily with meals.     Marland Kitchen diltiazem (TIAZAC) 240 MG 24 hr capsule Take 240 mg by mouth at bedtime.     . hydrALAZINE (APRESOLINE) 25 MG tablet TAKE 1 TABLET BY MOUTH TWICE DAILY 180 tablet 1  . lisinopril (PRINIVIL,ZESTRIL) 40 MG tablet Take 40 mg by mouth at bedtime.     . multivitamin (RENA-VIT) TABS tablet Take 1 tablet by mouth daily. 30 tablet 0  . pantoprazole (PROTONIX) 40 MG tablet Take 1 tablet (40 mg total) by mouth daily. 30 tablet 3  . PARoxetine (PAXIL) 30 MG tablet Take 30 mg by mouth daily.    . predniSONE (DELTASONE) 10 MG tablet Take 1 tablet (10 mg total) by mouth daily with breakfast. 90 tablet 3  . traZODone (DESYREL) 50 MG tablet Take 0.5-1 tablets (25-50 mg total) by mouth at bedtime. (Patient taking differently: Take 50 mg by mouth at bedtime. ) 90 tablet 3   No current facility-administered medications for this visit.     ROS:  See HPI  Physical Exam:   Incision:  Healed nicely.  Extremities:  Easily palpable LUA AVG with excellent thrill.  The swelling is much improved from her last visit but still present.  She does have a small superficial area on the posterior portion of her arm that has mild serosanguinous drainage.  There is no evidence of infection .   Assessment/Plan:  This is a 43 y.o. female who is s/p: 1. Ultrasound-guided access of the right internal jugular vein 2. Ultrasound-guided access of the left internal jugular vein 3. Placement of left internal jugular vein tunneled dialysis catheter (28 cm palindrome) 4. Incision and drainage of left arm hematoma 5. Left arm fistulogram On 09/24/2019 by Dr. Carlis Abbott  -swelling in [place]  Is significantly improved from the last visit.  Would give another 2 weeks and start using graft after Christmas.   -there is a small superficial area on the posterior aspect of the arm that has some serosanguinous drainage.  There is no evidence of infection and  she states this has gotten much better.  She will continue her current wound care and she will f/u with [place] as needed or if the drainage or the wound worsens.     Leontine Locket, PA-C [facility] [phone]  Clinic MD:  Trula Slade

## 2019-11-23 ENCOUNTER — Telehealth: Payer: Self-pay | Admitting: Cardiovascular Disease

## 2019-11-23 NOTE — Telephone Encounter (Signed)
Needing to confirm medication. Patient told them she was taking  Plaquenil(hydroxychlorquine) that we prescribed.  If Clarise Cruz is not available she asked that you tell them to have her paged.

## 2019-11-23 NOTE — Telephone Encounter (Signed)
Sarah aware that we have not given pt rx for Plaquenil - on hydralazine 25 mg bid - Judson Roch voiced understanding

## 2019-12-08 ENCOUNTER — Telehealth: Payer: Self-pay | Admitting: Advanced Practice Midwife

## 2019-12-08 NOTE — Telephone Encounter (Signed)
Called patient regarding appointment and the following message was left: ° ° °We have you scheduled for an upcoming appointment at our office. At this time, we are still not allowing visitors during the appointment, however, a support person, over age 44, may accompany you to your appointment if assistance is needed for safety or care concerns. Otherwise, support persons should remain outside until the visit is complete.  ° °We ask if you are sick, have any symptoms of COVID, have had any exposure to anyone suspected or confirmed of having COVID-19, or are awaiting test results for COVID-19, to call our office as we may need to reschedule you for a virtual visit or schedule your appointment for a later date.   ° °Please know we will ask you these questions or similar questions when you arrive for your appointment and understand this is how we are keeping everyone safe.   ° °Also,to keep you safe, please use the provided hand sanitizer when you enter the office. We are asking everyone in the office to wear a mask to help prevent the spread of °germs. If you have a mask of your own, please wear it to your appointment, if not, we are happy to provide one for you. ° °Thank you for understanding and your cooperation.  ° ° °[facility] Staff ° ° ° ° ° °

## 2019-12-09 ENCOUNTER — Other Ambulatory Visit (HOSPITAL_COMMUNITY)
Admission: RE | Admit: 2019-12-09 | Discharge: 2019-12-09 | Disposition: A | Discharge status: Home / Self Care | Payer: Medicare HMO | Source: Ambulatory Visit | Attending: Obstetrics and Gynecology | Admitting: Obstetrics and Gynecology

## 2019-12-09 ENCOUNTER — Ambulatory Visit (INDEPENDENT_AMBULATORY_CARE_PROVIDER_SITE_OTHER): Payer: Medicare HMO | Admitting: Obstetrics and Gynecology

## 2019-12-09 ENCOUNTER — Other Ambulatory Visit: Payer: Self-pay

## 2019-12-09 ENCOUNTER — Encounter: Payer: Self-pay | Admitting: Obstetrics and Gynecology

## 2019-12-09 VITALS — BP 147/100 | HR 92 | Ht 64.0 in | Wt 136.6 lb

## 2019-12-09 DIAGNOSIS — Z1151 Encounter for screening for human papillomavirus (HPV): Secondary | ICD-10-CM | POA: Diagnosis not present

## 2019-12-09 DIAGNOSIS — Z01411 Encounter for gynecological examination (general) (routine) with abnormal findings: Secondary | ICD-10-CM | POA: Diagnosis not present

## 2019-12-09 DIAGNOSIS — Z01419 Encounter for gynecological examination (general) (routine) without abnormal findings: Secondary | ICD-10-CM | POA: Diagnosis not present

## 2019-12-09 DIAGNOSIS — R87810 Cervical high risk human papillomavirus (HPV) DNA test positive: Secondary | ICD-10-CM | POA: Insufficient documentation

## 2019-12-09 DIAGNOSIS — Z1211 Encounter for screening for malignant neoplasm of colon: Secondary | ICD-10-CM

## 2019-12-09 LAB — HEMOCCULT GUIAC POC 1CARD (OFFICE): Fecal Occult Blood, POC: NEGATIVE

## 2019-12-09 NOTE — Progress Notes (Signed)
Assessment:  Annual Gyn Exam due to LSIL Pap last year ESRD on dialysis Plan:  1. pap smear done, next pap due 56yr or 5 yr depends on results 2. return  prn 3    Annual mammogram advised after age 44 Subjective:  [name] is a 44 y.o. female [name] who presents for annual exam. Patient's last menstrual period was 12/02/2019 (exact date). The patient has complaints today of none  The following portions of the patient's history were reviewed and updated as appropriate: allergies, current medications, past family history, past medical history, past social history, past surgical history and problem list. Past Medical History:  Diagnosis Date  . Allergy   . Anemia   . Arthritis   . Avascular necrosis of bone (HCC)    left tibial talus due to chronic prednisone use  . ESRD (end stage renal disease) on dialysis ([place])    tthsat [place]  . GERD (gastroesophageal reflux disease)   . Headache(784.0)   . Hyperlipemia   . Hypertension   . Lupus ([place])   . Secondary hyperparathyroidism (of renal origin)     Past Surgical History:  Procedure Laterality Date  . A/V FISTULAGRAM Left 12/02/2017   Procedure: A/V FISTULAGRAM - Left upper;  Surgeon: Angelia Mould, MD;  Location: [place] [facility];  Service: Cardiovascular;  Laterality: Left;  . AV FISTULA PLACEMENT Left   . AV FISTULA PLACEMENT Right 01/26/2014   Procedure: ARTERIOVENOUS (AV) FISTULA CREATION; ultrasound guided;  Surgeon: Conrad Waynesburg, MD;  Location: [place];  Service: Vascular;  Laterality: Right;  . ESOPHAGOGASTRODUODENOSCOPY N/A 03/17/2016   Procedure: ESOPHAGOGASTRODUODENOSCOPY (EGD);  Surgeon: Irene Shipper, MD;  Location[facility];  Service: Endoscopy;  Laterality: N/A;  . FISTULOGRAM Left 09/24/2019   Procedure: FISTULOGRAM LEFT ARM;  Surgeon: [name], MD;  Location: [place];  Service: Vascular;  Laterality: Left;  . HEMATOMA EVACUATION Left 10/09/2013   Procedure: EVACUATION HEMATOMA;   Surgeon: Angelia Mould, MD;  Location: [place];  Service: Vascular;  Laterality: Left;  . HEMATOMA EVACUATION Left 09/24/2019   Procedure: EVACUATION HEMATOMA  LEFT ARM;  Surgeon: [name], MD;  Location: [place];  Service: Vascular;  Laterality: Left;  . INCISION AND DRAINAGE ABSCESS     gluteal  . INSERTION OF DIALYSIS CATHETER N/A 10/09/2013   Procedure: INSERTION OF DIALYSIS CATHETER; ULTRASOUND GUIDED;  Surgeon: Angelia Mould, MD;  Location: [place];  Service: Vascular;  Laterality: N/A;  . INSERTION OF DIALYSIS CATHETER Left 09/24/2019   Procedure: INSERTION OF TUNNELLED DIALYSIS CATHETER - PALINDROME 15FR X 28CM;  Surgeon: [name], MD;  Location: [place];  Service: Vascular;  Laterality: Left;  . PERIPHERAL VASCULAR BALLOON ANGIOPLASTY Left 12/02/2017   Procedure: PERIPHERAL VASCULAR BALLOON ANGIOPLASTY;  Surgeon: Angelia Mould, MD;  Location: [place] [facility];  Service: Cardiovascular;  Laterality: Left;  . REVISION OF ARTERIOVENOUS GORETEX GRAFT Left 04/03/2019   Procedure: REVISION OF ARTERIOVENOUS GORETEX GRAFT PSEUDOANEURYSM;  Surgeon: Angelia Mould, MD;  Location: [place];  Service: Vascular;  Laterality: Left;  . SHUNTOGRAM N/A 01/07/2014   Procedure: fistulogram with possibe venoplasty left upper arm avg;  Surgeon: Angelia Mould, MD;  Location[facility] C[facility] LAB;  Service: Cardiovascular;  Laterality: N/A;  . tibiocalcaneal fusion  left Left   . ULTRASOUND GUIDANCE FOR VASCULAR ACCESS  09/24/2019   Procedure: Ultrasound Guidance For Vascular Access;  Surgeon: [name], MD;  Location: [place];  Service: Vascular;;     Current Outpatient Medications:  .  calcium acetate, Phos Binder, (PHOSLYRA) 667 MG/5ML SOLN, Take 667 mg by mouth 3 (three) times daily with meals. , Disp: , Rfl:  .  diltiazem (TIAZAC) 240 MG 24 hr capsule, Take 240 mg by mouth at bedtime. , Disp: , Rfl:  .  hydrALAZINE (APRESOLINE) 25 MG tablet, TAKE 1  TABLET BY MOUTH TWICE DAILY, Disp: 180 tablet, Rfl: 1 .  multivitamin (RENA-VIT) TABS tablet, Take 1 tablet by mouth daily., Disp: 30 tablet, Rfl: 0 .  pantoprazole (PROTONIX) 40 MG tablet, Take 1 tablet (40 mg total) by mouth daily., Disp: 30 tablet, Rfl: 3 .  PARoxetine (PAXIL) 30 MG tablet, Take 30 mg by mouth daily., Disp: , Rfl:  .  predniSONE (DELTASONE) 10 MG tablet, Take 1 tablet (10 mg total) by mouth daily with breakfast., Disp: 90 tablet, Rfl: 3 .  traZODone (DESYREL) 50 MG tablet, Take 0.5-1 tablets (25-50 mg total) by mouth at bedtime. (Patient taking differently: Take 50 mg by mouth at bedtime. ), Disp: 90 tablet, Rfl: 3  Review of Systems Constitutional: negative Gastrointestinal: negative Genitourinary: regular periods lasting 5 d down from long as 2 wk  Objective:  BP (!) 147/100 (BP Location: Right Arm, Patient Position: Sitting, Cuff Size: Normal)   Pulse 92   Ht 5\' 4"  (1.626 m)   Wt 136 lb 9.6 oz (62 kg)   LMP 12/02/2019 (Exact Date)   BMI 23.45 kg/m    BMI: Body mass index is 23.45 kg/m.  General Appearance: Alert, appropriate appearance for age. No acute distress HEENT: Grossly normal Neck / Thyroid:  Cardiovascular: RRR; normal S1, S2, no murmur Lungs: CTA bilaterally Back: No CVAT Breast Exam:  Gastrointestinal: Soft, non-tender, no masses or organomegaly Pelvic Exam: Vulva and vagina appear normal. Bimanual exam reveals normal uterus and adnexa. External genitalia: normal general appearance Cervix: normal appearance Adnexa: normal bimanual exam and pt on menses lightly Uterus: normal single, nontender Rectal: good sphincter tone and guaiac negative Clinical staff offered to be present for exam: yes  Initials: xx Rectovaginal: normal rectal, no masses Lymphatic Exam: Non-palpable nodes in neck, clavicular, axillary, or inguinal regions Skin: no rash or abnormalities Neurologic: Normal gait and speech, no tremor  Psychiatric: Alert and oriented,  appropriate affect.  Urinalysis:Not done  Mallory Shirk. MD Pgr [phone] 8:59 AM

## 2019-12-09 NOTE — Progress Notes (Signed)
error 

## 2019-12-11 LAB — CYTOLOGY - PAP
Comment: NEGATIVE
Comment: NEGATIVE
Diagnosis: NEGATIVE
HPV 16: NEGATIVE
HPV 18 / 45: NEGATIVE
High risk HPV: POSITIVE — AB

## 2019-12-14 ENCOUNTER — Telehealth: Payer: Self-pay | Admitting: *Deleted

## 2019-12-14 NOTE — Telephone Encounter (Signed)
Patient informed her pap was negative but was positive HPV.  Will need to repeat pap in 1 year and placed on recall list.  Pt verbalized understanding with no further questions.

## 2019-12-18 ENCOUNTER — Encounter: Payer: Self-pay | Admitting: Vascular Surgery

## 2019-12-18 ENCOUNTER — Other Ambulatory Visit: Payer: Self-pay

## 2019-12-28 ENCOUNTER — Other Ambulatory Visit (HOSPITAL_COMMUNITY)
Admission: RE | Admit: 2019-12-28 | Discharge: 2019-12-28 | Disposition: A | Discharge status: Home / Self Care | Payer: Medicare HMO | Source: Ambulatory Visit | Attending: Vascular Surgery | Admitting: Vascular Surgery

## 2019-12-28 DIAGNOSIS — Z20822 Contact with and (suspected) exposure to covid-19: Secondary | ICD-10-CM | POA: Diagnosis not present

## 2019-12-28 DIAGNOSIS — Z01818 Encounter for other preprocedural examination: Secondary | ICD-10-CM | POA: Insufficient documentation

## 2019-12-28 DIAGNOSIS — Z01812 Encounter for preprocedural laboratory examination: Secondary | ICD-10-CM | POA: Diagnosis present

## 2019-12-28 LAB — SARS CORONAVIRUS 2 (TAT 6-24 HRS): SARS Coronavirus 2: NEGATIVE

## 2020-01-01 ENCOUNTER — Other Ambulatory Visit: Payer: Self-pay

## 2020-01-01 ENCOUNTER — Ambulatory Visit: Admit: 2020-01-01 | Payer: Medicare HMO | Admitting: Vascular Surgery

## 2020-01-01 ENCOUNTER — Encounter: Payer: Self-pay | Admitting: Physician Assistant

## 2020-01-01 ENCOUNTER — Ambulatory Visit (HOSPITAL_COMMUNITY)
Admission: RE | Admit: 2020-01-01 | Discharge: 2020-01-01 | Disposition: A | Discharge status: Home / Self Care | Payer: Medicare HMO | Source: Ambulatory Visit | Attending: Vascular Surgery | Admitting: Vascular Surgery

## 2020-01-01 DIAGNOSIS — Z992 Dependence on renal dialysis: Secondary | ICD-10-CM | POA: Insufficient documentation

## 2020-01-01 DIAGNOSIS — N186 End stage renal disease: Secondary | ICD-10-CM | POA: Insufficient documentation

## 2020-01-01 SURGERY — REMOVAL, DIALYSIS CATHETER
Anesthesia: Choice

## 2020-01-01 MED ORDER — LIDOCAINE HCL (PF) 2 % IJ SOLN
INTRAMUSCULAR | Status: AC
  Filled 2020-01-01: qty 10

## 2020-01-01 NOTE — Progress Notes (Unsigned)
  Procedure Note   PRE-OPERATIVE DIAGNOSIS:   ESRD requiring chronic hemodialysis.   POST-OPERATIVE DIAGNOSIS:  ESRD requiring chronic hemodialysis.Marland Kitchen  PROCEDURE:  Removal of left IJ perm cath.   PROVIDER:   Barbie Banner, PA-C.  ANESTHESIA:  Local  The risks and benefits of this procedure were explained to the patient and the patient consented verbally and in writing. I confirmed the patient's allergies and reviewed her pertinent medication history.  OPERATIVE PROCEDURE:  The following procedure was performed at the bedside.  The left side of patient's neck and chest was prepped and draped in standard fashion.  Local anesthesia was infiltrated over the tunneled catheter and its cuff.  The cuff was loosened using a combination of blunt and sharp dissection.  The catheter was removed in its entirety, and hemostasis was achieved with local compression.    The patient tolerated the procedure well and did not have any complications.  RECOMMENDATIONS:   Keep bandage dry. Reinforce as needed.  Shower tomorrow. Do not lie flat for 6 hours. Call our office for excessive bleeding. She verbalized understanding of these instructions.   Barbie Banner, PA-C [facility] [phone] 01/01/2020  12:21 PM

## 2020-03-14 ENCOUNTER — Encounter (HOSPITAL_COMMUNITY): Payer: Self-pay | Admitting: Emergency Medicine

## 2020-03-14 ENCOUNTER — Other Ambulatory Visit: Payer: Self-pay

## 2020-03-14 ENCOUNTER — Inpatient Hospital Stay (HOSPITAL_COMMUNITY)
Admission: EM | Admit: 2020-03-14 | Discharge: 2020-03-16 | DRG: 314 | Disposition: A | Discharge status: Home / Self Care | Payer: Medicare HMO | Attending: Internal Medicine | Admitting: Internal Medicine

## 2020-03-14 DIAGNOSIS — T82868A Thrombosis of vascular prosthetic devices, implants and grafts, initial encounter: Secondary | ICD-10-CM | POA: Diagnosis not present

## 2020-03-14 DIAGNOSIS — E8889 Other specified metabolic disorders: Secondary | ICD-10-CM | POA: Diagnosis present

## 2020-03-14 DIAGNOSIS — Y832 Surgical operation with anastomosis, bypass or graft as the cause of abnormal reaction of the patient, or of later complication, without mention of misadventure at the time of the procedure: Secondary | ICD-10-CM | POA: Diagnosis present

## 2020-03-14 DIAGNOSIS — Z7952 Long term (current) use of systemic steroids: Secondary | ICD-10-CM

## 2020-03-14 DIAGNOSIS — Z992 Dependence on renal dialysis: Secondary | ICD-10-CM

## 2020-03-14 DIAGNOSIS — T82898A Other specified complication of vascular prosthetic devices, implants and grafts, initial encounter: Secondary | ICD-10-CM | POA: Diagnosis present

## 2020-03-14 DIAGNOSIS — I10 Essential (primary) hypertension: Secondary | ICD-10-CM

## 2020-03-14 DIAGNOSIS — Z8249 Family history of ischemic heart disease and other diseases of the circulatory system: Secondary | ICD-10-CM

## 2020-03-14 DIAGNOSIS — D696 Thrombocytopenia, unspecified: Secondary | ICD-10-CM | POA: Diagnosis present

## 2020-03-14 DIAGNOSIS — E875 Hyperkalemia: Secondary | ICD-10-CM | POA: Diagnosis not present

## 2020-03-14 DIAGNOSIS — Z20822 Contact with and (suspected) exposure to covid-19: Secondary | ICD-10-CM | POA: Diagnosis present

## 2020-03-14 DIAGNOSIS — Z419 Encounter for procedure for purposes other than remedying health state, unspecified: Secondary | ICD-10-CM

## 2020-03-14 DIAGNOSIS — D631 Anemia in chronic kidney disease: Secondary | ICD-10-CM | POA: Diagnosis present

## 2020-03-14 DIAGNOSIS — E785 Hyperlipidemia, unspecified: Secondary | ICD-10-CM | POA: Diagnosis present

## 2020-03-14 DIAGNOSIS — N186 End stage renal disease: Secondary | ICD-10-CM | POA: Diagnosis present

## 2020-03-14 DIAGNOSIS — I160 Hypertensive urgency: Secondary | ICD-10-CM | POA: Diagnosis present

## 2020-03-14 DIAGNOSIS — E1165 Type 2 diabetes mellitus with hyperglycemia: Secondary | ICD-10-CM | POA: Diagnosis present

## 2020-03-14 DIAGNOSIS — I120 Hypertensive chronic kidney disease with stage 5 chronic kidney disease or end stage renal disease: Secondary | ICD-10-CM | POA: Diagnosis present

## 2020-03-14 DIAGNOSIS — E1122 Type 2 diabetes mellitus with diabetic chronic kidney disease: Secondary | ICD-10-CM | POA: Diagnosis present

## 2020-03-14 DIAGNOSIS — Z825 Family history of asthma and other chronic lower respiratory diseases: Secondary | ICD-10-CM

## 2020-03-14 LAB — CBC
HCT: 34.7 % — ABNORMAL LOW (ref 36.0–46.0)
Hemoglobin: 10.5 g/dL — ABNORMAL LOW (ref 12.0–15.0)
MCH: 28.3 pg (ref 26.0–34.0)
MCHC: 30.3 g/dL (ref 30.0–36.0)
MCV: 93.5 fL (ref 80.0–100.0)
Platelets: 119 10*3/uL — ABNORMAL LOW (ref 150–400)
RBC: 3.71 MIL/uL — ABNORMAL LOW (ref 3.87–5.11)
RDW: 19.2 % — ABNORMAL HIGH (ref 11.5–15.5)
WBC: 14.2 10*3/uL — ABNORMAL HIGH (ref 4.0–10.5)
nRBC: 0.1 % (ref 0.0–0.2)

## 2020-03-14 LAB — BASIC METABOLIC PANEL
Anion gap: 19 — ABNORMAL HIGH (ref 5–15)
BUN: 98 mg/dL — ABNORMAL HIGH (ref 6–20)
CO2: 19 mmol/L — ABNORMAL LOW (ref 22–32)
Calcium: 8.8 mg/dL — ABNORMAL LOW (ref 8.9–10.3)
Chloride: 100 mmol/L (ref 98–111)
Creatinine, Ser: 12.19 mg/dL — ABNORMAL HIGH (ref 0.44–1.00)
GFR calc Af Amer: 4 mL/min — ABNORMAL LOW (ref >60)
GFR calc non Af Amer: 3 mL/min — ABNORMAL LOW (ref >60)
Glucose, Bld: 133 mg/dL — ABNORMAL HIGH (ref 70–99)
Potassium: 6.3 mmol/L (ref 3.5–5.1)
Sodium: 138 mmol/L (ref 135–145)

## 2020-03-14 LAB — I-STAT BETA HCG BLOOD, ED (MC, WL, AP ONLY): I-stat hCG, quantitative: <5 m[IU]/mL (ref <5)

## 2020-03-14 LAB — RESPIRATORY PANEL BY RT PCR (FLU A&B, COVID)
Influenza A by PCR: NEGATIVE
Influenza B by PCR: NEGATIVE
SARS Coronavirus 2 by RT PCR: NEGATIVE

## 2020-03-14 LAB — CBG MONITORING, ED
Glucose-Capillary: 133 mg/dL — ABNORMAL HIGH (ref 70–99)
Glucose-Capillary: 159 mg/dL — ABNORMAL HIGH (ref 70–99)

## 2020-03-14 MED ORDER — SODIUM ZIRCONIUM CYCLOSILICATE 10 G PO PACK: 10.0000 g | PACK | Freq: Once | ORAL | Status: DC

## 2020-03-14 MED ORDER — CALCIUM ACETATE (PHOS BINDER) 667 MG/5ML PO SOLN
667.0000 mg | Freq: Three times a day (TID) | ORAL | Status: DC
  Administered 2020-03-15 – 2020-03-16 (×2): 667 mg via ORAL
  Filled 2020-03-14 (×6): qty 5

## 2020-03-14 MED ORDER — HYDRALAZINE HCL 20 MG/ML IJ SOLN
10.0000 mg | INTRAMUSCULAR | Status: DC | PRN
  Administered 2020-03-14: 10 mg via INTRAVENOUS
  Filled 2020-03-14 (×2): qty 1

## 2020-03-14 MED ORDER — ALBUTEROL SULFATE HFA 108 (90 BASE) MCG/ACT IN AERS
8.0000 | INHALATION_SPRAY | Freq: Once | RESPIRATORY_TRACT | Status: AC
  Administered 2020-03-14: 8 via RESPIRATORY_TRACT
  Filled 2020-03-14: qty 6.7

## 2020-03-14 MED ORDER — RENA-VITE PO TABS
1.0000 | ORAL_TABLET | Freq: Every day | ORAL | Status: DC
  Administered 2020-03-15 (×2): 1 via ORAL
  Filled 2020-03-14 (×3): qty 1

## 2020-03-14 MED ORDER — LANTHANUM CARBONATE 500 MG PO CHEW
1000.0000 mg | CHEWABLE_TABLET | Freq: Three times a day (TID) | ORAL | Status: DC
  Administered 2020-03-16: 1000 mg via ORAL
  Filled 2020-03-14: qty 2

## 2020-03-14 MED ORDER — CHLORHEXIDINE GLUCONATE CLOTH 2 % EX PADS
6.0000 | MEDICATED_PAD | Freq: Every day | CUTANEOUS | Status: DC
  Administered 2020-03-16: 6 via TOPICAL

## 2020-03-14 MED ORDER — PANTOPRAZOLE SODIUM 40 MG PO TBEC
40.0000 mg | DELAYED_RELEASE_TABLET | Freq: Every day | ORAL | Status: DC
  Administered 2020-03-16: 40 mg via ORAL
  Filled 2020-03-14: qty 1

## 2020-03-14 MED ORDER — HYDRALAZINE HCL 25 MG PO TABS
25.0000 mg | ORAL_TABLET | Freq: Every day | ORAL | Status: DC
  Administered 2020-03-16: 25 mg via ORAL
  Filled 2020-03-14: qty 1

## 2020-03-14 MED ORDER — SODIUM ZIRCONIUM CYCLOSILICATE 10 G PO PACK
20.0000 g | PACK | Freq: Once | ORAL | Status: AC
  Administered 2020-03-14: 19:00:00 20 g via ORAL
  Filled 2020-03-14: qty 2

## 2020-03-14 MED ORDER — TRAZODONE HCL 50 MG PO TABS
50.0000 mg | ORAL_TABLET | Freq: Every day | ORAL | Status: DC
  Administered 2020-03-15 (×2): 50 mg via ORAL
  Filled 2020-03-14 (×2): qty 1

## 2020-03-14 MED ORDER — SODIUM ZIRCONIUM CYCLOSILICATE 10 G PO PACK
20.0000 g | PACK | Freq: Once | ORAL | Status: AC
  Administered 2020-03-14: 23:00:00 20 g via ORAL
  Filled 2020-03-14: qty 2

## 2020-03-14 MED ORDER — SEVELAMER CARBONATE 800 MG PO TABS
800.0000 mg | ORAL_TABLET | Freq: Every day | ORAL | Status: DC
  Administered 2020-03-16: 800 mg via ORAL
  Filled 2020-03-14: qty 1

## 2020-03-14 MED ORDER — PREDNISONE 10 MG PO TABS
10.0000 mg | ORAL_TABLET | Freq: Every day | ORAL | Status: DC
  Administered 2020-03-16: 10 mg via ORAL
  Filled 2020-03-14: qty 1

## 2020-03-14 MED ORDER — DILTIAZEM HCL ER COATED BEADS 240 MG PO CP24
240.0000 mg | ORAL_CAPSULE | Freq: Every day | ORAL | Status: DC
  Administered 2020-03-15 (×2): 240 mg via ORAL
  Filled 2020-03-14 (×3): qty 1

## 2020-03-14 MED ORDER — INSULIN ASPART 100 UNIT/ML IV SOLN
5.0000 [IU] | Freq: Once | INTRAVENOUS | Status: AC
  Administered 2020-03-14: 5 [IU] via INTRAVENOUS

## 2020-03-14 MED ORDER — DEXTROSE 50 % IV SOLN
1.0000 | Freq: Once | INTRAVENOUS | Status: AC
  Administered 2020-03-14: 50 mL via INTRAVENOUS
  Filled 2020-03-14: qty 50

## 2020-03-14 MED ORDER — PAROXETINE HCL 30 MG PO TABS
30.0000 mg | ORAL_TABLET | Freq: Every day | ORAL | Status: DC
  Administered 2020-03-16: 30 mg via ORAL
  Filled 2020-03-14 (×2): qty 1

## 2020-03-14 NOTE — Consult Note (Signed)
Hospital Consult    Reason for Consult:  Clotted left upper arm AV loop graft Referring Physician:  Nephrology MRN #:  [id number]  History of Present Illness: This is a 44 y.o. female with history of hypertension, hyperlipidemia, lupus, end-stage renal disease on dialysis Tuesday Thursday Saturday that vascular surgery has been consulted for a clotted left upper arm AV loop graft.  Patient states she noticed that her graft was occluded over the weekend.  She has not dialyzed in 4 days.  She states the graft has been working well aside from one episode of infiltration recently that required placement of tunneled dialysis catheter that has since been removed.  The graft was placed in 2012 by Dr. Scot Dock and is a left upper arm loop graft.  Has had multiple interventions including stenting venous anastomosis.    Past Medical History:  Diagnosis Date  . Allergy   . Anemia   . Arthritis   . Avascular necrosis of bone (HCC)    left tibial talus due to chronic prednisone use  . ESRD (end stage renal disease) on dialysis ([place])    tthsat [place]  . GERD (gastroesophageal reflux disease)   . Headache(784.0)   . Hyperlipemia   . Hypertension   . Lupus ([place])   . Secondary hyperparathyroidism (of renal origin)     Past Surgical History:  Procedure Laterality Date  . A/V FISTULAGRAM Left 12/02/2017   Procedure: A/V FISTULAGRAM - Left upper;  Surgeon: Angelia Mould, MD;  Location: [place] [facility];  Service: Cardiovascular;  Laterality: Left;  . AV FISTULA PLACEMENT Left   . AV FISTULA PLACEMENT Right 01/26/2014   Procedure: ARTERIOVENOUS (AV) FISTULA CREATION; ultrasound guided;  Surgeon: Conrad Ithaca, MD;  Location: [place];  Service: Vascular;  Laterality: Right;  . ESOPHAGOGASTRODUODENOSCOPY N/A 03/17/2016   Procedure: ESOPHAGOGASTRODUODENOSCOPY (EGD);  Surgeon: Irene Shipper, MD;  Location: [facility] ENDOSCOPY;  Service: Endoscopy;  Laterality: N/A;  . FISTULOGRAM Left 09/24/2019   Procedure: FISTULOGRAM LEFT ARM;  Surgeon: [name], MD;  Location: [place];  Service: Vascular;  Laterality: Left;  . HEMATOMA EVACUATION Left 10/09/2013   Procedure: EVACUATION HEMATOMA;  Surgeon: Angelia Mould, MD;  Location: [place];  Service: Vascular;  Laterality: Left;  . HEMATOMA EVACUATION Left 09/24/2019   Procedure: EVACUATION HEMATOMA  LEFT ARM;  Surgeon: [name], MD;  Location: [place];  Service: Vascular;  Laterality: Left;  . INCISION AND DRAINAGE ABSCESS     gluteal  . INSERTION OF DIALYSIS CATHETER N/A 10/09/2013   Procedure: INSERTION OF DIALYSIS CATHETER; ULTRASOUND GUIDED;  Surgeon: Angelia Mould, MD;  Location: [place];  Service: Vascular;  Laterality: N/A;  . INSERTION OF DIALYSIS CATHETER Left 09/24/2019   Procedure: INSERTION OF TUNNELLED DIALYSIS CATHETER - PALINDROME 15FR X 28CM;  Surgeon: [name], MD;  Location: [place];  Service: Vascular;  Laterality: Left;  . PERIPHERAL VASCULAR BALLOON ANGIOPLASTY Left 12/02/2017   Procedure: PERIPHERAL VASCULAR BALLOON ANGIOPLASTY;  Surgeon: Angelia Mould, MD;  Location: [place] [facility];  Service: Cardiovascular;  Laterality: Left;  . REVISION OF ARTERIOVENOUS GORETEX GRAFT Left 04/03/2019   Procedure: REVISION OF ARTERIOVENOUS GORETEX GRAFT PSEUDOANEURYSM;  Surgeon: Angelia Mould, MD;  Location: [place];  Service: Vascular;  Laterality: Left;  . SHUNTOGRAM N/A 01/07/2014   Procedure: fistulogram with possibe venoplasty left upper arm avg;  Surgeon: Angelia Mould, MD;  Location: [facility] CATH LAB;  Service: Cardiovascular;  Laterality: N/A;  .  tibiocalcaneal fusion  left Left   . ULTRASOUND GUIDANCE FOR VASCULAR ACCESS  09/24/2019   Procedure: Ultrasound Guidance For Vascular Access;  Surgeon: [name], MD;  Location: [place];  Service: Vascular;;    Allergies  Allergen Reactions  . Amlodipine Hives    Swelling  . Sulfa Antibiotics Hives and Itching  .  Vancomycin Hives and Itching  . Venlafaxine Other (See Comments)    Emotional    Prior to Admission medications   Medication Sig Start Date End Date Taking? Authorizing Provider  calcium acetate, Phos Binder, (PHOSLYRA) 667 MG/5ML SOLN Take 667 mg by mouth 4 (four) times daily -  with meals and at bedtime.    Yes [provider]  diltiazem (TIAZAC) 240 MG 24 hr capsule Take 240 mg by mouth at bedtime.    Yes [provider]  hydrALAZINE (APRESOLINE) 25 MG tablet TAKE 1 TABLET BY MOUTH TWICE DAILY Patient taking differently: Take 25 mg by mouth daily.  10/20/19  Yes Herminio Commons, MD  lanthanum (FOSRENOL) 1000 MG chewable tablet Chew 1 tablet by mouth See admin instructions. 1 tablet with meals and 1/2 tablet with snacks 12/10/19  Yes [provider]  lisinopril (ZESTRIL) 40 MG tablet Take 40 mg by mouth daily. 02/08/20  Yes [provider]  multivitamin (RENA-VIT) TABS tablet Take 1 tablet by mouth daily. 09/26/19  Yes Bloomfield, Carley D, DO  pantoprazole (PROTONIX) 40 MG tablet Take 1 tablet (40 mg total) by mouth daily. 12/04/17  Yes Hoyt Koch, MD  PARoxetine (PAXIL) 30 MG tablet Take 30 mg by mouth daily.   Yes [provider]  predniSONE (DELTASONE) 10 MG tablet Take 1 tablet (10 mg total) by mouth daily with breakfast. 08/29/17  Yes Hoyt Koch, MD  sevelamer carbonate (RENVELA) 800 MG tablet Take 800 mg by mouth daily.  01/30/20  Yes [provider]  traZODone (DESYREL) 50 MG tablet Take 0.5-1 tablets (25-50 mg total) by mouth at bedtime. Patient taking differently: Take 50 mg by mouth at bedtime.  08/29/17  Yes Hoyt Koch, MD    Social History   Socioeconomic History  . Marital status: Single    Spouse name: none  . Number of children: 0  . Years of education: 43  . Highest education level: Some college, no degree  Occupational History  . Occupation: unemployed    Comment: disability    Tobacco Use  . Smoking status: Never Smoker  . Smokeless tobacco: Never Used  Substance and Sexual Activity  . Alcohol use: No  . Drug use: No  . Sexual activity: Not Currently    Birth control/protection: None  Other Topics Concern  . Not on file  Social History Narrative   Lives alone   [place] to [place] 4 months ago to be closer to family   Previously lived in [place] , more active   Very close to sister [place]   Social Determinants of Health   Financial Resource Strain:   . Difficulty of Paying Living Expenses:   Food Insecurity:   . Worried About Charity fundraiser in the Last Year:   . Arboriculturist in the Last Year:   Transportation Needs:   . Film/video editor (Medical):   Marland Kitchen Lack of Transportation (Non-Medical):   Physical Activity:   . Days of Exercise per Week:   . Minutes of Exercise per Session:   Stress:   . Feeling of Stress :  Social Connections:   . Frequency of Communication with Friends and Family:   . Frequency of Social Gatherings with Friends and Family:   . Attends Religious Services:   . Active Member of Clubs or Organizations:   . Attends Archivist Meetings:   Marland Kitchen Marital Status:   Intimate Partner Violence:   . Fear of Current or Ex-Partner:   . Emotionally Abused:   Marland Kitchen Physically Abused:   . Sexually Abused:      Family History  Problem Relation Age of Onset  . Asthma Sister   . Hypertension Mother   . Heart attack Mother 60       died at 28  . COPD Maternal Uncle        prostate  . Mental illness Neg Hx     ROS: [x]  Positive   [ ]  Negative   [ ]  All sytems reviewed and are negative  Cardiovascular: []  chest pain/pressure []  palpitations []  SOB lying flat []  DOE []  pain in legs while walking []  pain in legs at rest []  pain in legs at night []  non-healing ulcers []  hx of DVT []  swelling in legs  Pulmonary: []  productive cough []  asthma/wheezing []  home O2  Neurologic: []  weakness in []  arms []   legs []  numbness in []  arms []  legs []  hx of CVA []  mini stroke [] difficulty speaking or slurred speech []  temporary loss of vision in one eye []  dizziness  Hematologic: []  hx of cancer []  bleeding problems []  problems with blood clotting easily  Endocrine:   []  diabetes []  thyroid disease  GI []  vomiting blood []  blood in stool  GU: []  CKD/renal failure []  HD--[]  M/W/F or []  T/T/S []  burning with urination []  blood in urine  Psychiatric: []  anxiety []  depression  Musculoskeletal: []  arthritis []  joint pain  Integumentary: []  rashes []  ulcers  Constitutional: []  fever []  chills   Physical Examination  Vitals:   03/14/20 1730 03/14/20 1830  BP: (!) 177/112 (!) 168/108  Pulse: 85 88  Resp: 12 13  Temp:    SpO2: 100% 100%   There is no height or weight on file to calculate BMI.  General:  NAD Gait: Not observed HENT: WNL, normocephalic Pulmonary: normal non-labored breathing, without Rales, rhonchi,  wheezing Cardiac: regular, without  Murmurs, rubs or gallops Abdomen: *soft, NT/ND, no masses Vascular Exam/Pulses: LUE loop graft, no thrill, palpable radial pulse at the wrist Extremities: without ischemic changes, without Gangrene , without cellulitis; without open wounds;  Musculoskeletal: no muscle wasting or atrophy  Neurologic: A&O X 3; Appropriate Affect ; SENSATION: normal; MOTOR FUNCTION:  moving all extremities equally. Speech is fluent/normal   CBC    Component Value Date/Time   WBC 14.2 (H) 03/14/2020 1219   RBC 3.71 (L) 03/14/2020 1219   HGB 10.5 (L) 03/14/2020 1219   HCT 34.7 (L) 03/14/2020 1219   PLT 119 (L) 03/14/2020 1219   MCV 93.5 03/14/2020 1219   MCH 28.3 03/14/2020 1219   MCHC 30.3 03/14/2020 1219   RDW 19.2 (H) 03/14/2020 1219   LYMPHSABS 0.4 (L) 09/24/2019 0954   MONOABS 0.9 09/24/2019 0954   EOSABS 0.0 09/24/2019 0954   BASOSABS 0.1 09/24/2019 0954    BMET    Component Value Date/Time   NA 138 03/14/2020 1219   K  6.3 (HH) 03/14/2020 1219   CL 100 03/14/2020 1219   CO2 19 (L) 03/14/2020 1219   GLUCOSE 133 (H) 03/14/2020 1219   BUN 98 (H)  03/14/2020 1219   CREATININE 12.19 (H) 03/14/2020 1219   CALCIUM 8.8 (L) 03/14/2020 1219   CALCIUM 8.5 06/21/2011 0622   GFRNONAA 3 (L) 03/14/2020 1219   GFRAA 4 (L) 03/14/2020 1219    COAGS: Lab Results  Component Value Date   INR 1.20 03/17/2016   INR 1.02 06/14/2011   INR 0.9 09/01/2008     Non-Invasive Vascular Imaging:    None   ASSESSMENT/PLAN: This is a 44 y.o. female with multiple medical problems including end-stage renal disease presents with a clotted left upper arm loop graft.  Apparently nephrology discussed with IR for percutaneous intervention and they state there is no time tomorrow for intervention.  As a result vascular surgery was consulted.  I have posted her tomorrow with Dr. Donzetta Matters for thrombectomy of her left upper arm loop graft as well as possible TDC placement if we cannot declot her graft.  Discussed given multiple previous interventions may not be salvageable.  Please keep n.p.o. after midnight.  Her Covid test is negative which we will need to proceed to the OR.  [name], MD [facility] Office: [place]

## 2020-03-14 NOTE — ED Triage Notes (Signed)
Pt in with clogged L dialysis graft, states she hs not been dialyzed x 4 days. Usually T-Th-S. States vascular was not able to address the issue. Denies any sob or other s/s

## 2020-03-14 NOTE — H&P (Signed)
History and Physical    [name] MBW:[id number] DOB: 09-Jul-1976 DOA: 03/14/2020  PCP: Leeanne Rio, MD  Patient coming from: Home.  Chief Complaint: Clotted left AV fistula.  HPI: [name] is a 44 y.o. female with history of ESRD on hemodialysis on Tuesday Thursdays and Saturday, hypertension lupus on prednisone presents to the ER because of clotted left AV fistula.  Patient last had dialysis on Thursday last week and was told on Saturday that she had a clotted left AV fistula plan was to contact vascular surgery but since there was some concerns patient presents to the ER.  Patient did not have any chest pain shortness of breath but does feel some weakness.    ED Course: In the ER labs show potassium of 6.3 with EKG showing some concerning T wave changes for hyperkalemia creatinine was 12.1 hemoglobin 10.5 WBC 14.2 platelets 119 Covid test was negative.  On-call vascular surgeon Dr. Carlis Abbott and Dr. Moshe Cipro of nephrology was consulted patient was given Lokelma IV insulin and D50 for the hyperkalemia.  Patient admitted for declotting in the morning.  While in the ER patient had chills but no fever.  Review of Systems: As per HPI, rest all negative.   Past Medical History:  Diagnosis Date  . Allergy   . Anemia   . Arthritis   . Avascular necrosis of bone (HCC)    left tibial talus due to chronic prednisone use  . ESRD (end stage renal disease) on dialysis ([place])    tthsat [place]  . GERD (gastroesophageal reflux disease)   . Headache(784.0)   . Hyperlipemia   . Hypertension   . Lupus ([place])   . Secondary hyperparathyroidism (of renal origin)     Past Surgical History:  Procedure Laterality Date  . A/V FISTULAGRAM Left 12/02/2017   Procedure: A/V FISTULAGRAM - Left upper;  Surgeon: Angelia Mould, MD;  Location: [place] [facility];  Service: Cardiovascular;  Laterality: Left;  . AV FISTULA PLACEMENT Left   . AV FISTULA PLACEMENT Right  01/26/2014   Procedure: ARTERIOVENOUS (AV) FISTULA CREATION; ultrasound guided;  Surgeon: Conrad Four Bridges, MD;  Location: [place];  Service: Vascular;  Laterality: Right;  . ESOPHAGOGASTRODUODENOSCOPY N/A 03/17/2016   Procedure: ESOPHAGOGASTRODUODENOSCOPY (EGD);  Surgeon: Irene Shipper, MD;  Location: [facility] ENDOSCOPY;  Service: Endoscopy;  Laterality: N/A;  . FISTULOGRAM Left 09/24/2019   Procedure: FISTULOGRAM LEFT ARM;  Surgeon: [name], MD;  Location: [place];  Service: Vascular;  Laterality: Left;  . HEMATOMA EVACUATION Left 10/09/2013   Procedure: EVACUATION HEMATOMA;  Surgeon: Angelia Mould, MD;  Location: [place];  Service: Vascular;  Laterality: Left;  . HEMATOMA EVACUATION Left 09/24/2019   Procedure: EVACUATION HEMATOMA  LEFT ARM;  Surgeon: [name], MD;  Location: [place];  Service: Vascular;  Laterality: Left;  . INCISION AND DRAINAGE ABSCESS     gluteal  . INSERTION OF DIALYSIS CATHETER N/A 10/09/2013   Procedure: INSERTION OF DIALYSIS CATHETER; ULTRASOUND GUIDED;  Surgeon: Angelia Mould, MD;  Location: [place];  Service: Vascular;  Laterality: N/A;  . INSERTION OF DIALYSIS CATHETER Left 09/24/2019   Procedure: INSERTION OF TUNNELLED DIALYSIS CATHETER - PALINDROME 15FR X 28CM;  Surgeon: [name], MD;  Location: [place];  Service: Vascular;  Laterality: Left;  . PERIPHERAL VASCULAR BALLOON ANGIOPLASTY Left 12/02/2017   Procedure: PERIPHERAL VASCULAR BALLOON ANGIOPLASTY;  Surgeon: Angelia Mould, MD;  Location: [place] [facility];  Service:  Cardiovascular;  Laterality: Left;  . REVISION OF ARTERIOVENOUS GORETEX GRAFT Left 04/03/2019   Procedure: REVISION OF ARTERIOVENOUS GORETEX GRAFT PSEUDOANEURYSM;  Surgeon: Angelia Mould, MD;  Location: [place];  Service: Vascular;  Laterality: Left;  . SHUNTOGRAM N/A 01/07/2014   Procedure: fistulogram with possibe venoplasty left upper arm avg;  Surgeon: Angelia Mould, MD;  Location: [facility] CATH LAB;   Service: Cardiovascular;  Laterality: N/A;  . tibiocalcaneal fusion  left Left   . ULTRASOUND GUIDANCE FOR VASCULAR ACCESS  09/24/2019   Procedure: Ultrasound Guidance For Vascular Access;  Surgeon: [name], MD;  Location: [place];  Service: Vascular;;     reports that she has never smoked. She has never used smokeless tobacco. She reports that she does not drink alcohol or use drugs.  Allergies  Allergen Reactions  . Amlodipine Hives    Swelling  . Sulfa Antibiotics Hives and Itching  . Vancomycin Hives and Itching  . Venlafaxine Other (See Comments)    Emotional    Family History  Problem Relation Age of Onset  . Asthma Sister   . Hypertension Mother   . Heart attack Mother 41       died at 69  . COPD Maternal Uncle        prostate  . Mental illness Neg Hx     Prior to Admission medications   Medication Sig Start Date End Date Taking? Authorizing Provider  calcium acetate, Phos Binder, (PHOSLYRA) 667 MG/5ML SOLN Take 667 mg by mouth 4 (four) times daily -  with meals and at bedtime.    Yes [provider]  diltiazem (TIAZAC) 240 MG 24 hr capsule Take 240 mg by mouth at bedtime.    Yes [provider]  hydrALAZINE (APRESOLINE) 25 MG tablet TAKE 1 TABLET BY MOUTH TWICE DAILY Patient taking differently: Take 25 mg by mouth daily.  10/20/19  Yes Herminio Commons, MD  lanthanum (FOSRENOL) 1000 MG chewable tablet Chew 1 tablet by mouth See admin instructions. 1 tablet with meals and 1/2 tablet with snacks 12/10/19  Yes [provider]  lisinopril (ZESTRIL) 40 MG tablet Take 40 mg by mouth daily. 02/08/20  Yes [provider]  multivitamin (RENA-VIT) TABS tablet Take 1 tablet by mouth daily. 09/26/19  Yes Bloomfield, Carley D, DO  pantoprazole (PROTONIX) 40 MG tablet Take 1 tablet (40 mg total) by mouth daily. 12/04/17  Yes Hoyt Koch, MD  PARoxetine (PAXIL) 30 MG tablet Take 30 mg by mouth daily.   Yes [provider]   predniSONE (DELTASONE) 10 MG tablet Take 1 tablet (10 mg total) by mouth daily with breakfast. 08/29/17  Yes Hoyt Koch, MD  sevelamer carbonate (RENVELA) 800 MG tablet Take 800 mg by mouth daily.  01/30/20  Yes [provider]  traZODone (DESYREL) 50 MG tablet Take 0.5-1 tablets (25-50 mg total) by mouth at bedtime. Patient taking differently: Take 50 mg by mouth at bedtime.  08/29/17  Yes Hoyt Koch, MD    Physical Exam: Constitutional: Moderately built and nourished. Vitals:   03/14/20 1945 03/14/20 2030 03/14/20 2115 03/14/20 2145  BP: (!) 186/118 (!) 162/110 (!) 163/104 (!) 170/108  Pulse: 92 98 97 (!) 105  Resp: 14 15 13 16   Temp:      TempSrc:      SpO2: 100% 98% 96% 96%   Eyes: Anicteric no pallor. ENMT: No discharge from the ears eyes nose or mouth. Neck: No mass felt.  No  neck rigidity. Respiratory: No rhonchi or crepitations. Cardiovascular: S1-S2 heard. Abdomen: Soft nontender bowel sounds present. Musculoskeletal: Bilateral lower extremity edema present. Skin: No rash. Neurologic: Alert awake oriented to time place and person.  Moves all extremities. Psychiatric: Appears normal.  Normal affect.   Labs on Admission: I have personally reviewed following labs and imaging studies  CBC: Recent Labs  Lab 03/14/20 1219  WBC 14.2*  HGB 10.5*  HCT 34.7*  MCV 93.5  PLT 096*   Basic Metabolic Panel: Recent Labs  Lab 03/14/20 1219  NA 138  K 6.3*  CL 100  CO2 19*  GLUCOSE 133*  BUN 98*  CREATININE 12.19*  CALCIUM 8.8*   GFR: CrCl cannot be calculated (Unknown ideal weight.). Liver Function Tests: No results for input(s): AST, ALT, ALKPHOS, BILITOT, PROT, ALBUMIN in the last 168 hours. No results for input(s): LIPASE, AMYLASE in the last 168 hours. No results for input(s): AMMONIA in the last 168 hours. Coagulation Profile: No results for input(s): INR, PROTIME in the last 168 hours. Cardiac Enzymes: No results for  input(s): CKTOTAL, CKMB, CKMBINDEX, TROPONINI in the last 168 hours. BNP (last 3 results) No results for input(s): PROBNP in the last 8760 hours. HbA1C: No results for input(s): HGBA1C in the last 72 hours. CBG: Recent Labs  Lab 03/14/20 1743 03/14/20 1857  GLUCAP 133* 159*   Lipid Profile: No results for input(s): CHOL, HDL, LDLCALC, TRIG, CHOLHDL, LDLDIRECT in the last 72 hours. Thyroid Function Tests: No results for input(s): TSH, T4TOTAL, FREET4, T3FREE, THYROIDAB in the last 72 hours. Anemia Panel: No results for input(s): VITAMINB12, FOLATE, FERRITIN, TIBC, IRON, RETICCTPCT in the last 72 hours. Urine analysis:    Component Value Date/Time   COLORURINE YELLOW 06/11/2011 0959   APPEARANCEUR TURBID (A) 06/11/2011 0959   LABSPEC 1.025 06/11/2011 0959   PHURINE 5.0 06/11/2011 0959   GLUCOSEU NEGATIVE 06/11/2011 0959   HGBUR MODERATE (A) 06/11/2011 0959   BILIRUBINUR NEGATIVE 06/11/2011 0959   KETONESUR NEGATIVE 06/11/2011 0959   PROTEINUR 100 (A) 06/11/2011 0959   UROBILINOGEN 0.2 06/11/2011 0959   NITRITE NEGATIVE 06/11/2011 0959   LEUKOCYTESUR TRACE (A) 06/11/2011 0959   Sepsis Labs: @LABRCNTIP (procalcitonin:4,lacticidven:4) ) Recent Results (from the past 240 hour(s))  Respiratory Panel by RT PCR (Flu A&B, Covid) - Nasopharyngeal Swab     Status: None   Collection Time: 03/14/20  5:56 PM   Specimen: Nasopharyngeal Swab  Result Value Ref Range Status   SARS Coronavirus 2 by RT PCR NEGATIVE NEGATIVE Final    Comment: (NOTE) SARS-CoV-2 target nucleic acids are NOT DETECTED. The SARS-CoV-2 RNA is generally detectable in upper respiratoy specimens during the acute phase of infection. The lowest concentration of SARS-CoV-2 viral copies this assay can detect is 131 copies/mL. A negative result does not preclude SARS-Cov-2 infection and should not be used as the sole basis for treatment or other patient management decisions. A negative result may occur with  improper  specimen collection/handling, submission of specimen other than nasopharyngeal swab, presence of viral mutation(s) within the areas targeted by this assay, and inadequate number of viral copies (<131 copies/mL). A negative result must be combined with clinical observations, patient history, and epidemiological information. The expected result is Negative. Fact Sheet for Patients:  PinkCheek.be Fact Sheet for Healthcare Providers:  GravelBags.it This test is not yet ap proved or cleared by the [place] FDA and  has been authorized for detection and/or diagnosis of SARS-CoV-2 by FDA under an Emergency Use Authorization (EUA). This EUA  will remain  in effect (meaning this test can be used) for the duration of the COVID-19 declaration under Section 564(b)(1) of the Act, 21 U.S.C. section 360bbb-3(b)(1), unless the authorization is terminated or revoked sooner.    Influenza A by PCR NEGATIVE NEGATIVE Final   Influenza B by PCR NEGATIVE NEGATIVE Final    Comment: (NOTE) The Xpert Xpress SARS-CoV-2/FLU/RSV assay is intended as an aid in  the diagnosis of influenza from Nasopharyngeal swab specimens and  should not be used as a sole basis for treatment. Nasal washings and  aspirates are unacceptable for Xpert Xpress SARS-CoV-2/FLU/RSV  testing. Fact Sheet for Patients: PinkCheek.be Fact Sheet for Healthcare Providers: GravelBags.it This test is not yet approved or cleared by the [place] FDA and  has been authorized for detection and/or diagnosis of SARS-CoV-2 by  FDA under an Emergency Use Authorization (EUA). This EUA will remain  in effect (meaning this test can be used) for the duration of the  Covid-19 declaration under Section 564(b)(1) of the Act, 21  U.S.C. section 360bbb-3(b)(1), unless the authorization is  terminated or revoked. Performed at [facility], [place] [address] [place], [place] [address]      Radiological Exams on Admission: No results found.  EKG: Independently reviewed.  Normal sinus rhythm with peaked T waves.  Assessment/Plan Principal Problem:   AV fistula occlusion, initial encounter [facility]) Active Problems:   ESRD on dialysis [facility])   Essential hypertension    1. Clotted left AV fistula -appreciate vascular surgery consult.  Plan for declotting in the morning. 2. ESRD on hemodialysis on Tuesday Thursday Saturday presently hyperkalemic for which patient received Lokelma D50 IV insulin we will follow metabolic panel patient to get AV fistula declotted following which patient have dialysis.  Follow metabolic panel closely. 3. Chills and rigors with no fever.  I have ordered blood cultures we will keep patient on empiric antibiotics. 4. Hypertensive urgency we will keep patient on patient's diltiazem hydralazine and as needed IV hydralazine.  Hold of lisinopril due to hyperkalemia for now.  Follow blood pressure trends. 5. History of lupus on prednisone.  I have placed patient on 1 dose of IV hydrocortisone for stress dose. 6. Hyperglycemia check hemoglobin A1c with next blood draw. 7. Anemia and thrombocytopenia appears to be chronic.  Follow CBC.   DVT prophylaxis: SCDs for now.  Anticipation of procedure will hold off anticoagulation. Code Status: Full code. Family Communication: Discussed with patient. Disposition Plan: Home. Consults called: Vascular surgery and nephrology. Admission status: Observation.   Rise Patience MD [facility] Pager [phone].  If 7PM-7AM, please contact night-coverage [email or web address] Password [facility]  03/14/2020, 11:17 PM

## 2020-03-14 NOTE — ED Provider Notes (Signed)
[facility] EMERGENCY DEPARTMENT Provider Note   CSN: [id number] Arrival date & time: 03/14/20  1151     History Chief Complaint  Patient presents with  . Vascular Access Problem    [name] is a 44 y.o. female.  44 yo F with a chief complaints of needing to go to dialysis.  She states she feels bad like when she is not on dialysis.  Last went on Thursday.  Typically goes Tuesday Thursday and Saturday.  States that she got dialysis normally on Thursday and then when she went on Saturday they were unable to access the fistula.  She called the dialysis center today and there was apparently no one there that could troubleshoot her fistula and they told her to come to the hospital.  She has been retaining some fluid but denies any shortness of breath orthopnea or PND.  The history is provided by the patient.  Illness Severity:  Moderate Onset quality:  Gradual Duration:  4 days Timing:  Constant Progression:  Worsening Chronicity:  New Associated symptoms: fatigue   Associated symptoms: no chest pain, no congestion, no fever, no headaches, no myalgias, no nausea, no rhinorrhea, no shortness of breath, no vomiting and no wheezing        Past Medical History:  Diagnosis Date  . Allergy   . Anemia   . Arthritis   . Avascular necrosis of bone (HCC)    left tibial talus due to chronic prednisone use  . ESRD (end stage renal disease) on dialysis ([place])    tthsat [place]  . GERD (gastroesophageal reflux disease)   . Headache(784.0)   . Hyperlipemia   . Hypertension   . Lupus ([place])   . Secondary hyperparathyroidism (of renal origin)     Patient Active Problem List   Diagnosis Date Noted  . Hematoma of arm, left, initial encounter 09/25/2019  . AV graft malfunction ([place]) 09/24/2019  . Mild dysplasia of cervix (CIN I) bx provern 10/2018 11/26/2018  . Heart palpitations 11/20/2017  . GERD (gastroesophageal reflux disease) 07/10/2016  . Acute  blood loss anemia 03/17/2016  . Thrombocytopenia ([place]) 03/17/2016  . Duodenal ulcer hemorrhagic   . Esophageal stricture   . Adjustment disorder with mixed anxiety and depressed mood 10/25/2015  . Lupus ([place]) 04/29/2015  . ESRD on dialysis ([place]) 10/09/2013  . Anemia in chronic kidney disease 10/09/2013  . Secondary renal hyperparathyroidism ([place]) 10/09/2013  . Essential hypertension 10/09/2013    Past Surgical History:  Procedure Laterality Date  . A/V FISTULAGRAM Left 12/02/2017   Procedure: A/V FISTULAGRAM - Left upper;  Surgeon: Angelia Mould, MD;  Location: [facility];  Service: Cardiovascular;  Laterality: Left;  . AV FISTULA PLACEMENT Left   . AV FISTULA PLACEMENT Right 01/26/2014   Procedure: ARTERIOVENOUS (AV) FISTULA CREATION; ultrasound guided;  Surgeon: Conrad Ripon, MD;  Location: [facility];  Service: Vascular;  Laterality: Right;  . ESOPHAGOGASTRODUODENOSCOPY N/A 03/17/2016   Procedure: ESOPHAGOGASTRODUODENOSCOPY (EGD);  Surgeon: Irene Shipper, MD;  Location: [facility] ENDOSCOPY;  Service: Endoscopy;  Laterality: N/A;  . FISTULOGRAM Left 09/24/2019   Procedure: FISTULOGRAM LEFT ARM;  Surgeon: [name], MD;  Location: [facility];  Service: Vascular;  Laterality: Left;  . HEMATOMA EVACUATION Left 10/09/2013   Procedure: EVACUATION HEMATOMA;  Surgeon: Angelia Mould, MD;  Location: [facility];  Service: Vascular;  Laterality: Left;  . HEMATOMA EVACUATION Left 09/24/2019   Procedure: EVACUATION HEMATOMA  LEFT ARM;  Surgeon:  [name], MD;  Location: [facility];  Service: Vascular;  Laterality: Left;  . INCISION AND DRAINAGE ABSCESS     gluteal  . INSERTION OF DIALYSIS CATHETER N/A 10/09/2013   Procedure: INSERTION OF DIALYSIS CATHETER; ULTRASOUND GUIDED;  Surgeon: Angelia Mould, MD;  Location: [facility];  Service: Vascular;  Laterality: N/A;  . INSERTION OF DIALYSIS CATHETER Left 09/24/2019   Procedure: INSERTION OF TUNNELLED DIALYSIS CATHETER - PALINDROME  15FR X 28CM;  Surgeon: [name], MD;  Location: [facility];  Service: Vascular;  Laterality: Left;  . PERIPHERAL VASCULAR BALLOON ANGIOPLASTY Left 12/02/2017   Procedure: PERIPHERAL VASCULAR BALLOON ANGIOPLASTY;  Surgeon: Angelia Mould, MD;  Location: [facility];  Service: Cardiovascular;  Laterality: Left;  . REVISION OF ARTERIOVENOUS GORETEX GRAFT Left 04/03/2019   Procedure: REVISION OF ARTERIOVENOUS GORETEX GRAFT PSEUDOANEURYSM;  Surgeon: Angelia Mould, MD;  Location: [facility];  Service: Vascular;  Laterality: Left;  . SHUNTOGRAM N/A 01/07/2014   Procedure: fistulogram with possibe venoplasty left upper arm avg;  Surgeon: Angelia Mould, MD;  Location: [facility] CATH LAB;  Service: Cardiovascular;  Laterality: N/A;  . tibiocalcaneal fusion  left Left   . ULTRASOUND GUIDANCE FOR VASCULAR ACCESS  09/24/2019   Procedure: Ultrasound Guidance For Vascular Access;  Surgeon: [name], MD;  Location: [facility] OR;  Service: Vascular;;     OB History    Gravida  0   Para  0   Term  0   Preterm  0   AB  0   Living  0     SAB  0   TAB  0   Ectopic  0   Multiple  0   Live Births  0           Family History  Problem Relation Age of Onset  . Asthma Sister   . Hypertension Mother   . Heart attack Mother 8       died at 68  . COPD Maternal Uncle        prostate  . Mental illness Neg Hx     Social History   Tobacco Use  . Smoking status: Never Smoker  . Smokeless tobacco: Never Used  Substance Use Topics  . Alcohol use: No  . Drug use: No    Home Medications Prior to Admission medications   Medication Sig Start Date End Date Taking? Authorizing Provider  calcium acetate, Phos Binder, (PHOSLYRA) 667 MG/5ML SOLN Take 667 mg by mouth 3 (three) times daily with meals.     [provider]  diltiazem (TIAZAC) 240 MG 24 hr capsule Take 240 mg by mouth at bedtime.     [provider]  hydrALAZINE (APRESOLINE) 25 MG tablet  TAKE 1 TABLET BY MOUTH TWICE DAILY 10/20/19   Herminio Commons, MD  multivitamin (RENA-VIT) TABS tablet Take 1 tablet by mouth daily. 09/26/19   [place], Carley D, DO  pantoprazole (PROTONIX) 40 MG tablet Take 1 tablet (40 mg total) by mouth daily. 12/04/17   Hoyt Koch, MD  PARoxetine (PAXIL) 30 MG tablet Take 30 mg by mouth daily.    [provider]  predniSONE (DELTASONE) 10 MG tablet Take 1 tablet (10 mg total) by mouth daily with breakfast. 08/29/17   Hoyt Koch, MD  traZODone (DESYREL) 50 MG tablet Take 0.5-1 tablets (25-50 mg total) by mouth at bedtime. Patient taking differently: Take 50 mg by mouth at bedtime.  08/29/17   Sharlet Salina,  Real Cons, MD    Allergies    Amlodipine, Sulfa antibiotics, Vancomycin, and Venlafaxine  Review of Systems   Review of Systems  Constitutional: Positive for fatigue. Negative for chills and fever.  HENT: Negative for congestion and rhinorrhea.   Eyes: Negative for redness and visual disturbance.  Respiratory: Negative for shortness of breath and wheezing.   Cardiovascular: Negative for chest pain and palpitations.  Gastrointestinal: Negative for nausea and vomiting.  Genitourinary: Negative for dysuria and urgency.  Musculoskeletal: Negative for arthralgias and myalgias.  Skin: Negative for pallor and wound.  Neurological: Negative for dizziness and headaches.    Physical Exam Updated Vital Signs BP (!) 168/108   Pulse 88   Temp 97.6 F (36.4 C) (Oral)   Resp 13   SpO2 100%   Physical Exam Vitals and nursing note reviewed.  Constitutional:      General: She is not in acute distress.    Appearance: She is well-developed. She is not diaphoretic.  HENT:     Head: Normocephalic and atraumatic.  Eyes:     Pupils: Pupils are equal, round, and reactive to light.  Cardiovascular:     Rate and Rhythm: Normal rate and regular rhythm.     Heart sounds: No murmur. No friction rub. No gallop.   Pulmonary:      Effort: Pulmonary effort is normal.     Breath sounds: No wheezing or rales.  Abdominal:     General: There is no distension.     Palpations: Abdomen is soft.     Tenderness: There is no abdominal tenderness.  Musculoskeletal:        General: No tenderness.     Cervical back: Normal range of motion and neck supple.     Right lower leg: Edema present.     Left lower leg: Edema present.     Comments: Mild edema to the left upper extremity.  No palpable thrill to the fistula.  Radial pulse 2+ and equal to the right.  1+ edema to bilateral lower extremities.  Skin:    General: Skin is warm and dry.  Neurological:     Mental Status: She is alert and oriented to person, place, and time.  Psychiatric:        Behavior: Behavior normal.     ED Results / Procedures / Treatments   Labs (all labs ordered are listed, but only abnormal results are displayed) Labs Reviewed  BASIC METABOLIC PANEL - Abnormal; Notable for the following components:      Result Value   Potassium 6.3 (*)    CO2 19 (*)    Glucose, Bld 133 (*)    BUN 98 (*)    Creatinine, Ser 12.19 (*)    Calcium 8.8 (*)    GFR calc non Af Amer 3 (*)    GFR calc Af Amer 4 (*)    Anion gap 19 (*)    All other components within normal limits  CBC - Abnormal; Notable for the following components:   WBC 14.2 (*)    RBC 3.71 (*)    Hemoglobin 10.5 (*)    HCT 34.7 (*)    RDW 19.2 (*)    Platelets 119 (*)    All other components within normal limits  CBG MONITORING, ED - Abnormal; Notable for the following components:   Glucose-Capillary 133 (*)    All other components within normal limits  CBG MONITORING, ED - Abnormal; Notable for the following components:   Glucose-Capillary  159 (*)    All other components within normal limits  RESPIRATORY PANEL BY RT PCR (FLU A&B, COVID)  I-STAT BETA HCG BLOOD, ED ([facility], [facility], [facility] ONLY)    EKG EKG Interpretation  Date/Time:  Monday March 14 2020 17:27:44 EDT Ventricular Rate:  86 PR  Interval:    QRS Duration: 127 QT Interval:  408 QTC Calculation: 488 R Axis:   -89 Text Interpretation: Sinus rhythm 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  Procedures Procedures (including critical care time)  Medications Ordered in ED Medications  insulin aspart (novoLOG) injection 5 Units (5 Units Intravenous Given 03/14/20 1750)    And  dextrose 50 % solution 50 mL (50 mLs Intravenous Given 03/14/20 1750)  albuterol (VENTOLIN HFA) 108 (90 Base) MCG/ACT inhaler 8 puff (8 puffs Inhalation Given 03/14/20 1750)  sodium zirconium cyclosilicate (LOKELMA) packet 20 g (20 g Oral Given 03/14/20 1851)    ED Course  I have reviewed the triage vital signs and the nursing notes.  Pertinent labs & imaging results that were available during my care of the patient were reviewed by me and considered in my medical decision making (see chart for details).    MDM Rules/Calculators/A&P                      44 yo F with a chief complaint of her AV fistula not working.  She gets dialysis Tuesday Thursday and Saturday.  Last went on Thursday.  Having some generalized fatigue and feeling like she is retaining more fluid than normal.  Potassium here 6.3 without hemolysis.  EKG with peaked T waves compared to prior.  Will temporize.  Discussed with nephrology.  I discussed the case with Dr. Moshe Cipro.  She recommended I reach out to IR and she will try to figure out if she could fit into the fistula clinic they have locally.  I discussed case with Dr. Pascal Lux, he thinks would be unlikely to get her into IR because there is a significant procedure being performed tomorrow morning.  I discussed this again with Dr. Moshe Cipro.  She plans to discuss with the hospitalist to have the patient admitted overnight for possible intervention tomorrow.  CRITICAL CARE Performed  by: Cecilio Asper   Total critical care time: 35 minutes  Critical care time was exclusive of separately billable procedures and treating other patients.  Critical care was necessary to treat or prevent imminent or life-threatening deterioration.  Critical care was time spent personally by me on the following activities: development of treatment plan with patient and/or surrogate as well as nursing, discussions with consultants, evaluation of patient's response to treatment, examination of patient, obtaining history from patient or surrogate, ordering and performing treatments and interventions, ordering and review of laboratory studies, ordering and review of radiographic studies, pulse oximetry and re-evaluation of patient's condition.  The patients results and plan were reviewed and discussed.   Any x-rays performed were independently reviewed by myself.   Differential diagnosis were considered with the presenting HPI.  Medications  insulin aspart (novoLOG) injection 5 Units (5 Units Intravenous Given 03/14/20 1750)    And  dextrose 50 % solution 50 mL (50 mLs Intravenous Given 03/14/20 1750)  albuterol (VENTOLIN HFA) 108 (90 Base) MCG/ACT inhaler 8 puff (8 puffs Inhalation Given 03/14/20 1750)  sodium zirconium cyclosilicate (LOKELMA) packet 20 g (20  g Oral Given 03/14/20 1851)    Vitals:   03/14/20 1217 03/14/20 1547 03/14/20 1730 03/14/20 1830  BP: (!) 154/106 (!) 175/117 (!) 177/112 (!) 168/108  Pulse: 85 86 85 88  Resp: 18 20 12 13   Temp: 98.1 F (36.7 C) 97.6 F (36.4 C)    TempSrc: Oral Oral    SpO2: 98% 100% 100% 100%    Final diagnoses:  Hyperkalemia  AV fistula occlusion, initial encounter [facility])    Admission/ observation were discussed with the admitting physician, patient and/or family and they are comfortable with the plan.   Final Clinical Impression(s) / ED Diagnoses Final diagnoses:  Hyperkalemia  AV fistula occlusion, initial encounter [facility])     Rx / DC Orders ED Discharge Orders    None       Deno Etienne, DO 03/14/20 1916

## 2020-03-14 NOTE — Consult Note (Signed)
Reason for Consult: To manage dialysis and dialysis related needs Referring Physician: Dontasia Miranda is an 44 y.o. female with history of lupus, hypertension and resultant ESRD-currently gets dialysis TTS at [facility].  She has had a left upper arm AV graft that appears to have been present since 2015.  She suffered an infiltration last fall which required [facility] placement temporarily-was removed in February.  According to the patient, she completed her last dialysis on Thursday.  Was found to be clotted at dialysis on Saturday so was sent home.  Attempts were made to arrange declot as an outpatient today through [facility].  Unclear what the issue was but they were not able to get a hold of anyone so they advised her to go to the emergency department.  I called after 5 PM.  Patient says that she feels like she has not had dialysis for a while.  She feels sluggish.  BUN and creatinine 98 and 12 with potassium of 6.3-some EKG changes, was given Lokelma.  I was able to get in touch with Dr. Otelia Santee and we had arranged for a declot tomorrow morning at 8 AM at [facility]-she would then go to her outpatient dialysis appointment.  However, patient stated that she did not feel comfortable with that plan.  She would feel better if she could stay in the hospital and get it taken care of here.  The emergency department contacted interventional radiology who explained that due to 1 or more complex procedures on the schedule tomorrow they could not promise that a declot could be done.  I have contacted Dr. Carlis Abbott with vascular as well   Dialysis Orders:  Orders as of November 2020-was not able to contact [facility] this evening [facility] T[facility] 3H 400/800 EDW 60.5kg 2K/2.5Ca  Heparin 1800 bolus  Hectorol 1 mcg TIW  EPO 1200 TIW   Past Medical History:  Diagnosis Date  . Allergy   . Anemia   . Arthritis   . Avascular necrosis of bone (HCC)    left tibial talus due to chronic prednisone use  .  ESRD (end stage renal disease) on dialysis ([place])    [place]  . GERD (gastroesophageal reflux disease)   . Headache(784.0)   . Hyperlipemia   . Hypertension   . Lupus ([place])   . Secondary hyperparathyroidism (of renal origin)     Past Surgical History:  Procedure Laterality Date  . A/V FISTULAGRAM Left 12/02/2017   Procedure: A/V FISTULAGRAM - Left upper;  Surgeon: Angelia Mould, MD;  Location: [place] [facility];  Service: Cardiovascular;  Laterality: Left;  . AV FISTULA PLACEMENT Left   . AV FISTULA PLACEMENT Right 01/26/2014   Procedure: ARTERIOVENOUS (AV) FISTULA CREATION; ultrasound guided;  Surgeon: Conrad Taylor Mill, MD;  Location: [place];  Service: Vascular;  Laterality: Right;  . ESOPHAGOGASTRODUODENOSCOPY N/A 03/17/2016   Procedure: ESOPHAGOGASTRODUODENOSCOPY (EGD);  Surgeon: Irene Shipper, MD;  Location: [facility] ENDOSCOPY;  Service: Endoscopy;  Laterality: N/A;  . FISTULOGRAM Left 09/24/2019   Procedure: FISTULOGRAM LEFT ARM;  Surgeon: [name], MD;  Location: [place];  Service: Vascular;  Laterality: Left;  . HEMATOMA EVACUATION Left 10/09/2013   Procedure: EVACUATION HEMATOMA;  Surgeon: Angelia Mould, MD;  Location: [place];  Service: Vascular;  Laterality: Left;  . HEMATOMA EVACUATION Left 09/24/2019   Procedure: EVACUATION HEMATOMA  LEFT ARM;  Surgeon: [name], MD;  Location: [place];  Service: Vascular;  Laterality: Left;  . INCISION AND DRAINAGE ABSCESS     gluteal  . INSERTION OF DIALYSIS CATHETER N/A 10/09/2013   Procedure: INSERTION OF DIALYSIS CATHETER; ULTRASOUND GUIDED;  Surgeon: Angelia Mould, MD;  Location: [place];  Service: Vascular;  Laterality: N/A;  . INSERTION OF DIALYSIS CATHETER Left 09/24/2019   Procedure: INSERTION OF TUNNELLED DIALYSIS CATHETER - PALINDROME 15FR X 28CM;  Surgeon: [name], MD;  Location: [place];  Service: Vascular;  Laterality: Left;  . PERIPHERAL VASCULAR BALLOON ANGIOPLASTY Left  12/02/2017   Procedure: PERIPHERAL VASCULAR BALLOON ANGIOPLASTY;  Surgeon: Angelia Mould, MD;  Location: [place] [facility];  Service: Cardiovascular;  Laterality: Left;  . REVISION OF ARTERIOVENOUS GORETEX GRAFT Left 04/03/2019   Procedure: REVISION OF ARTERIOVENOUS GORETEX GRAFT PSEUDOANEURYSM;  Surgeon: Angelia Mould, MD;  Location: [place];  Service: Vascular;  Laterality: Left;  . SHUNTOGRAM N/A 01/07/2014   Procedure: fistulogram with possibe venoplasty left upper arm avg;  Surgeon: Angelia Mould, MD;  Location: [facility] [place] CATH LAB;  Service: Cardiovascular;  Laterality: N/A;  . tibiocalcaneal fusion  left Left   . ULTRASOUND GUIDANCE FOR VASCULAR ACCESS  09/24/2019   Procedure: Ultrasound Guidance For Vascular Access;  Surgeon: [name], MD;  Location[facility];  Service: Vascular;;    Family History  Problem Relation Age of Onset  . Asthma Sister   . Hypertension Mother   . Heart attack Mother 30       died at 55  . COPD Maternal Uncle        prostate  . Mental illness Neg Hx     Social History:  reports that she has never smoked. She has never used smokeless tobacco. She reports that she does not drink alcohol or use drugs.  Allergies:  Allergies  Allergen Reactions  . Amlodipine Hives    Swelling  . Sulfa Antibiotics Hives and Itching  . Vancomycin Hives and Itching  . Venlafaxine Other (See Comments)    Emotional    Medications: I have reviewed the patient's current medications.   Results for orders placed or performed during the hospital encounter of 03/14/20 (from the past 48 hour(s))  Basic metabolic panel     Status: Abnormal   Collection Time: 03/14/20 12:19 PM  Result Value Ref Range   Sodium 138 135 - 145 mmol/L   Potassium 6.3 (HH) 3.5 - 5.1 mmol/L    Comment: CRITICAL RESULT CALLED TO, READ BACK BY AND VERIFIED WITH: A.JASPER,RN 1402 03/14/20 CLARK,S    Chloride 100 98 - 111 mmol/L   CO2 19 (L) 22 - 32 mmol/L   Glucose, Bld 133  (H) 70 - 99 mg/dL    Comment: Glucose reference range applies only to samples taken after fasting for at least 8 hours.   BUN 98 (H) 6 - 20 mg/dL   Creatinine, Ser 12.19 (H) 0.44 - 1.00 mg/dL   Calcium 8.8 (L) 8.9 - 10.3 mg/dL   GFR calc non Af Amer 3 (L) >60 mL/min   GFR calc Af Amer 4 (L) >60 mL/min   Anion gap 19 (H) 5 - 15    Comment: Performed at [facility] [address] [place] [address]  CBC     Status: Abnormal   Collection Time: 03/14/20 12:19 PM  Result Value Ref Range   WBC 14.2 (H) 4.0 - 10.5 K/uL   RBC 3.71 (L) 3.87 - 5.11 MIL/uL   Hemoglobin 10.5 (L) 12.0 -  15.0 g/dL   HCT 34.7 (L) 36.0 - 46.0 %   MCV 93.5 80.0 - 100.0 fL   MCH 28.3 26.0 - 34.0 pg   MCHC 30.3 30.0 - 36.0 g/dL   RDW 19.2 (H) 11.5 - 15.5 %   Platelets 119 (L) 150 - 400 K/uL    Comment: SPECIMEN CHECKED FOR CLOTS Immature Platelet Fraction may be clinically indicated, consider ordering this additional test [id number] PLATELET COUNT CONFIRMED BY SMEAR REPEATED TO VERIFY    nRBC 0.1 0.0 - 0.2 %    Comment: Performed at [facility], [place] [address], [place], [place] [address]  I-Stat beta hCG blood, ED     Status: None   Collection Time: 03/14/20  1:17 PM  Result Value Ref Range   I-stat hCG, quantitative <5.0 <5 mIU/mL   Comment 3            Comment:   GEST. AGE      CONC.  (mIU/mL)   <=1 WEEK        5 - 50     2 WEEKS       50 - 500     3 WEEKS       100 - 10,000     4 WEEKS     1,000 - 30,000        FEMALE AND NON-PREGNANT FEMALE:     LESS THAN 5 mIU/mL   CBG monitoring, ED     Status: Abnormal   Collection Time: 03/14/20  5:43 PM  Result Value Ref Range   Glucose-Capillary 133 (H) 70 - 99 mg/dL    Comment: Glucose reference range applies only to samples taken after fasting for at least 8 hours.   Comment 1 Notify RN   Respiratory Panel by RT PCR (Flu A&B, Covid) - Nasopharyngeal Swab     Status: None   Collection Time: 03/14/20  5:56 PM   Specimen: Nasopharyngeal  Swab  Result Value Ref Range   SARS Coronavirus 2 by RT PCR NEGATIVE NEGATIVE    Comment: (NOTE) SARS-CoV-2 target nucleic acids are NOT DETECTED. The SARS-CoV-2 RNA is generally detectable in upper respiratoy specimens during the acute phase of infection. The lowest concentration of SARS-CoV-2 viral copies this assay can detect is 131 copies/mL. A negative result does not preclude SARS-Cov-2 infection and should not be used as the sole basis for treatment or other patient management decisions. A negative result may occur with  improper specimen collection/handling, submission of specimen other than nasopharyngeal swab, presence of viral mutation(s) within the areas targeted by this assay, and inadequate number of viral copies (<131 copies/mL). A negative result must be combined with clinical observations, patient history, and epidemiological information. The expected result is Negative. Fact Sheet for Patients:  PinkCheek.be Fact Sheet for Healthcare Providers:  GravelBags.it This test is not yet ap proved or cleared by the [place] FDA and  has been authorized for detection and/or diagnosis of SARS-CoV-2 by FDA under an Emergency Use Authorization (EUA). This EUA will remain  in effect (meaning this test can be used) for the duration of the COVID-19 declaration under Section 564(b)(1) of the Act, 21 U.S.C. section 360bbb-3(b)(1), unless the authorization is terminated or revoked sooner.    Influenza A by PCR NEGATIVE NEGATIVE   Influenza B by PCR NEGATIVE NEGATIVE    Comment: (NOTE) The Xpert Xpress SARS-CoV-2/FLU/RSV assay is intended as an aid in  the diagnosis of influenza from Nasopharyngeal swab specimens and  should not be used as a sole basis for treatment. Nasal washings and  aspirates are unacceptable for Xpert Xpress SARS-CoV-2/FLU/RSV  testing. Fact Sheet for  Patients: PinkCheek.be Fact Sheet for Healthcare Providers: GravelBags.it This test is not yet approved or cleared by the [place] FDA and  has been authorized for detection and/or diagnosis of SARS-CoV-2 by  FDA under an Emergency Use Authorization (EUA). This EUA will remain  in effect (meaning this test can be used) for the duration of the  Covid-19 declaration under Section 564(b)(1) of the Act, 21  U.S.C. section 360bbb-3(b)(1), unless the authorization is  terminated or revoked. Performed at [facility], [place] [address] [place], [place] [address]   CBG monitoring, ED     Status: Abnormal   Collection Time: 03/14/20  6:57 PM  Result Value Ref Range   Glucose-Capillary 159 (H) 70 - 99 mg/dL    Comment: Glucose reference range applies only to samples taken after fasting for at least 8 hours.    No results found.  ROS: Admits to feeling sluggish, slightly volume overloaded Blood pressure (!) 168/108, pulse 88, temperature 97.6 F (36.4 C), temperature source Oral, resp. rate 13, SpO2 100 %. General appearance: alert and mild distress Resp: diminished breath sounds bibasilar Cardio: regular rate and rhythm, S1, S2 normal, no murmur, click, rub or gallop GI: soft, non-tender; bowel sounds normal; no masses,  no organomegaly Extremities: edema trace Left upper arm AV graft with no thrill or bruit  Assessment/Plan: 44 year old black female with lupus, ESRD.  She presents with clotted AV graft and last dialysis Thursday 4/22 1 clotted AV graft-patient needs a declot.  I was able to arrange an outpatient appointment for [facility] for tomorrow morning but put that on hold as patient wanted to be admitted and that would be an outpatient procedure.  Consult and placed to interventional radiology.  I also discussed with Dr. Carlis Abbott the vascular surgeon on-call.  It seems her best option would be if interventional would be  able to declot her tomorrow as it would be a less invasive procedure for her.  Will pass on to the team in the morning to help navigate.  As soon as has functional access will perform dialysis treatment 2 ESRD: No dialysis since Thursday 4/22.  Once access usable we will plan for hemodialysis inpatient 3 Hypertension: Blood pressures likely high secondary to volume overload due to missed dialysis.  Will likely need as needed medications for hypertension overnight 4. Anemia of ESRD: Hemoglobin greater than 10.  No action needed at this time 5. Metabolic Bone Disease: We will continue with home medications once we find them out 6.  Hyperkalemia- given Lokelma now and also just before midnight, check labs in a.m.   Louis Meckel 03/14/2020, 7:18 PM

## 2020-03-15 ENCOUNTER — Observation Stay (HOSPITAL_COMMUNITY): Payer: Medicare HMO | Admitting: Certified Registered Nurse Anesthetist

## 2020-03-15 ENCOUNTER — Encounter (HOSPITAL_COMMUNITY)
Admission: EM | Disposition: A | Discharge status: Home / Self Care | Payer: Self-pay | Source: Home / Self Care | Attending: Internal Medicine

## 2020-03-15 ENCOUNTER — Encounter (HOSPITAL_COMMUNITY): Payer: Self-pay | Admitting: Internal Medicine

## 2020-03-15 ENCOUNTER — Observation Stay (HOSPITAL_COMMUNITY): Payer: Medicare HMO

## 2020-03-15 DIAGNOSIS — Z992 Dependence on renal dialysis: Secondary | ICD-10-CM

## 2020-03-15 DIAGNOSIS — N186 End stage renal disease: Secondary | ICD-10-CM

## 2020-03-15 DIAGNOSIS — T82868A Thrombosis of vascular prosthetic devices, implants and grafts, initial encounter: Secondary | ICD-10-CM | POA: Diagnosis not present

## 2020-03-15 DIAGNOSIS — T82898A Other specified complication of vascular prosthetic devices, implants and grafts, initial encounter: Secondary | ICD-10-CM | POA: Diagnosis not present

## 2020-03-15 HISTORY — PX: INSERTION OF DIALYSIS CATHETER: SHX1324

## 2020-03-15 HISTORY — PX: THROMBECTOMY AND REVISION OF ARTERIOVENTOUS (AV) GORETEX  GRAFT: SHX6120

## 2020-03-15 LAB — RENAL FUNCTION PANEL
Albumin: 3.2 g/dL — ABNORMAL LOW (ref 3.5–5.0)
Anion gap: 19 — ABNORMAL HIGH (ref 5–15)
BUN: 108 mg/dL — ABNORMAL HIGH (ref 6–20)
CO2: 19 mmol/L — ABNORMAL LOW (ref 22–32)
Calcium: 8.3 mg/dL — ABNORMAL LOW (ref 8.9–10.3)
Chloride: 98 mmol/L (ref 98–111)
Creatinine, Ser: 13.59 mg/dL — ABNORMAL HIGH (ref 0.44–1.00)
GFR calc Af Amer: 3 mL/min — ABNORMAL LOW (ref >60)
GFR calc non Af Amer: 3 mL/min — ABNORMAL LOW (ref >60)
Glucose, Bld: 246 mg/dL — ABNORMAL HIGH (ref 70–99)
Phosphorus: 8.4 mg/dL — ABNORMAL HIGH (ref 2.5–4.6)
Potassium: 6.9 mmol/L (ref 3.5–5.1)
Sodium: 136 mmol/L (ref 135–145)

## 2020-03-15 LAB — BASIC METABOLIC PANEL
Anion gap: 19 — ABNORMAL HIGH (ref 5–15)
BUN: 27 mg/dL — ABNORMAL HIGH (ref 6–20)
CO2: 23 mmol/L (ref 22–32)
Calcium: 8.7 mg/dL — ABNORMAL LOW (ref 8.9–10.3)
Chloride: 98 mmol/L (ref 98–111)
Creatinine, Ser: 5.75 mg/dL — ABNORMAL HIGH (ref 0.44–1.00)
GFR calc Af Amer: 10 mL/min — ABNORMAL LOW (ref >60)
GFR calc non Af Amer: 8 mL/min — ABNORMAL LOW (ref >60)
Glucose, Bld: 87 mg/dL (ref 70–99)
Potassium: 4 mmol/L (ref 3.5–5.1)
Sodium: 140 mmol/L (ref 135–145)

## 2020-03-15 LAB — HEMOGLOBIN A1C
Hgb A1c MFr Bld: 7.4 % — ABNORMAL HIGH (ref 4.8–5.6)
Mean Plasma Glucose: 165.68 mg/dL

## 2020-03-15 LAB — CBC
HCT: 33.6 % — ABNORMAL LOW (ref 36.0–46.0)
Hemoglobin: 10.3 g/dL — ABNORMAL LOW (ref 12.0–15.0)
MCH: 28.2 pg (ref 26.0–34.0)
MCHC: 30.7 g/dL (ref 30.0–36.0)
MCV: 92.1 fL (ref 80.0–100.0)
Platelets: 113 10*3/uL — ABNORMAL LOW (ref 150–400)
RBC: 3.65 MIL/uL — ABNORMAL LOW (ref 3.87–5.11)
RDW: 19.1 % — ABNORMAL HIGH (ref 11.5–15.5)
WBC: 15.3 10*3/uL — ABNORMAL HIGH (ref 4.0–10.5)
nRBC: 0 % (ref 0.0–0.2)

## 2020-03-15 LAB — CBG MONITORING, ED: Glucose-Capillary: 163 mg/dL — ABNORMAL HIGH (ref 70–99)

## 2020-03-15 LAB — HEPATITIS B SURFACE ANTIGEN: Hepatitis B Surface Ag: NONREACTIVE

## 2020-03-15 SURGERY — THROMBECTOMY AND REVISION OF ARTERIOVENTOUS (AV) GORETEX  GRAFT
Anesthesia: General | Site: Neck | Laterality: Right

## 2020-03-15 MED ORDER — SODIUM ZIRCONIUM CYCLOSILICATE 10 G PO PACK: 10.0000 g | PACK | Freq: Once | ORAL | Status: DC

## 2020-03-15 MED ORDER — SODIUM CHLORIDE 0.9 % IV SOLN
INTRAVENOUS | Status: DC | PRN
  Administered 2020-03-15: 500 mL

## 2020-03-15 MED ORDER — CALCIUM GLUCONATE-NACL 1-0.675 GM/50ML-% IV SOLN: 1.0000 g | Freq: Once | INTRAVENOUS | Status: DC

## 2020-03-15 MED ORDER — SODIUM CHLORIDE 0.9 % IV SOLN
1.0000 g | INTRAVENOUS | Status: DC
  Administered 2020-03-15 – 2020-03-16 (×2): 1 g via INTRAVENOUS
  Filled 2020-03-15 (×3): qty 1

## 2020-03-15 MED ORDER — HEPARIN SODIUM (PORCINE) 1000 UNIT/ML IJ SOLN
INTRAMUSCULAR | Status: AC
  Filled 2020-03-15: qty 1

## 2020-03-15 MED ORDER — LIDOCAINE 2% (20 MG/ML) 5 ML SYRINGE
INTRAMUSCULAR | Status: DC | PRN
  Administered 2020-03-15: 40 mg via INTRAVENOUS

## 2020-03-15 MED ORDER — LIDOCAINE HCL (PF) 1 % IJ SOLN
5.0000 mL | INTRAMUSCULAR | Status: DC | PRN
  Filled 2020-03-15: qty 5

## 2020-03-15 MED ORDER — ONDANSETRON HCL 4 MG/2ML IJ SOLN
INTRAMUSCULAR | Status: AC
  Filled 2020-03-15: qty 2

## 2020-03-15 MED ORDER — OXYCODONE-ACETAMINOPHEN 5-325 MG PO TABS: 1.0000 | ORAL_TABLET | Freq: Four times a day (QID) | ORAL | 0 refills | Status: DC | PRN

## 2020-03-15 MED ORDER — PROTAMINE SULFATE 10 MG/ML IV SOLN
INTRAVENOUS | Status: DC | PRN
  Administered 2020-03-15: 40 mg via INTRAVENOUS

## 2020-03-15 MED ORDER — HEPARIN SODIUM (PORCINE) 1000 UNIT/ML DIALYSIS: 20.0000 [IU]/kg | INTRAMUSCULAR | Status: DC | PRN

## 2020-03-15 MED ORDER — SODIUM CHLORIDE 0.9 % IV SOLN: INTRAVENOUS | Status: DC

## 2020-03-15 MED ORDER — ONDANSETRON HCL 4 MG/2ML IJ SOLN
INTRAMUSCULAR | Status: DC | PRN
  Administered 2020-03-15: 4 mg via INTRAVENOUS

## 2020-03-15 MED ORDER — SODIUM ZIRCONIUM CYCLOSILICATE 10 G PO PACK
20.0000 g | PACK | Freq: Once | ORAL | Status: AC
  Administered 2020-03-15: 20 g via ORAL
  Filled 2020-03-15: qty 2

## 2020-03-15 MED ORDER — LIDOCAINE-PRILOCAINE 2.5-2.5 % EX CREA: 1.0000 "application " | TOPICAL_CREAM | CUTANEOUS | Status: DC | PRN

## 2020-03-15 MED ORDER — ALTEPLASE 2 MG IJ SOLR: 2.0000 mg | Freq: Once | INTRAMUSCULAR | Status: DC | PRN

## 2020-03-15 MED ORDER — FENTANYL CITRATE (PF) 250 MCG/5ML IJ SOLN
INTRAMUSCULAR | Status: AC
  Filled 2020-03-15: qty 5

## 2020-03-15 MED ORDER — SODIUM CHLORIDE 0.9 % IV SOLN
INTRAVENOUS | Status: AC
  Filled 2020-03-15: qty 1.2

## 2020-03-15 MED ORDER — PROPOFOL 10 MG/ML IV BOLUS
INTRAVENOUS | Status: DC | PRN
  Administered 2020-03-15: 50 mg via INTRAVENOUS
  Administered 2020-03-15: 150 mg via INTRAVENOUS

## 2020-03-15 MED ORDER — EPHEDRINE 5 MG/ML INJ
INTRAVENOUS | Status: AC
  Filled 2020-03-15: qty 10

## 2020-03-15 MED ORDER — HEPARIN SODIUM (PORCINE) 1000 UNIT/ML IJ SOLN
INTRAMUSCULAR | Status: AC
  Filled 2020-03-15: qty 4

## 2020-03-15 MED ORDER — PHENYLEPHRINE 40 MCG/ML (10ML) SYRINGE FOR IV PUSH (FOR BLOOD PRESSURE SUPPORT)
PREFILLED_SYRINGE | INTRAVENOUS | Status: DC | PRN
  Administered 2020-03-15 (×2): 120 ug via INTRAVENOUS
  Administered 2020-03-15: 80 ug via INTRAVENOUS

## 2020-03-15 MED ORDER — INSULIN ASPART 100 UNIT/ML ~~LOC~~ SOLN
10.0000 [IU] | Freq: Once | SUBCUTANEOUS | Status: AC
  Administered 2020-03-15: 10 [IU] via SUBCUTANEOUS

## 2020-03-15 MED ORDER — HEPARIN SODIUM (PORCINE) 1000 UNIT/ML IJ SOLN
4000.0000 [IU] | INTRAMUSCULAR | Status: AC
  Filled 2020-03-15: qty 4

## 2020-03-15 MED ORDER — HYDROCORTISONE NA SUCCINATE PF 100 MG IJ SOLR
50.0000 mg | Freq: Once | INTRAMUSCULAR | Status: AC
  Administered 2020-03-15: 50 mg via INTRAVENOUS
  Filled 2020-03-15: qty 2

## 2020-03-15 MED ORDER — SODIUM CHLORIDE 0.9 % IV SOLN: 100.0000 mL | INTRAVENOUS | Status: DC | PRN

## 2020-03-15 MED ORDER — SUCCINYLCHOLINE CHLORIDE 200 MG/10ML IV SOSY
PREFILLED_SYRINGE | INTRAVENOUS | Status: AC
  Filled 2020-03-15: qty 10

## 2020-03-15 MED ORDER — FENTANYL CITRATE (PF) 100 MCG/2ML IJ SOLN
25.0000 ug | INTRAMUSCULAR | Status: AC | PRN
  Administered 2020-03-15: 25 ug via INTRAVENOUS
  Administered 2020-03-16: 50 ug via INTRAVENOUS
  Filled 2020-03-15 (×2): qty 2

## 2020-03-15 MED ORDER — ACETAMINOPHEN 325 MG PO TABS: 650.0000 mg | ORAL_TABLET | Freq: Four times a day (QID) | ORAL | Status: DC | PRN

## 2020-03-15 MED ORDER — LIDOCAINE 2% (20 MG/ML) 5 ML SYRINGE
INTRAMUSCULAR | Status: AC
  Filled 2020-03-15: qty 5

## 2020-03-15 MED ORDER — CALCIUM GLUCONATE-NACL 1-0.675 GM/50ML-% IV SOLN
1.0000 g | Freq: Once | INTRAVENOUS | Status: AC
  Administered 2020-03-15: 1000 mg via INTRAVENOUS
  Filled 2020-03-15: qty 50

## 2020-03-15 MED ORDER — PROTAMINE SULFATE 10 MG/ML IV SOLN
INTRAVENOUS | Status: AC
  Filled 2020-03-15: qty 5

## 2020-03-15 MED ORDER — HEPARIN SODIUM (PORCINE) 1000 UNIT/ML IJ SOLN
INTRAMUSCULAR | Status: DC | PRN
Start: 2020-03-15 — End: 2020-03-15
  Administered 2020-03-15: 3200 [IU]

## 2020-03-15 MED ORDER — HEPARIN SODIUM (PORCINE) 1000 UNIT/ML IJ SOLN
INTRAMUSCULAR | Status: DC | PRN
  Administered 2020-03-15: 6000 [IU] via INTRAVENOUS

## 2020-03-15 MED ORDER — SODIUM BICARBONATE 8.4 % IV SOLN
50.0000 meq | Freq: Once | INTRAVENOUS | Status: AC
  Administered 2020-03-15: 50 meq via INTRAVENOUS
  Filled 2020-03-15: qty 50

## 2020-03-15 MED ORDER — FENTANYL CITRATE (PF) 100 MCG/2ML IJ SOLN
INTRAMUSCULAR | Status: DC | PRN
  Administered 2020-03-15 (×3): 50 ug via INTRAVENOUS

## 2020-03-15 MED ORDER — PHENYLEPHRINE 40 MCG/ML (10ML) SYRINGE FOR IV PUSH (FOR BLOOD PRESSURE SUPPORT)
PREFILLED_SYRINGE | INTRAVENOUS | Status: AC
  Filled 2020-03-15: qty 10

## 2020-03-15 MED ORDER — DEXTROSE 50 % IV SOLN
12.5000 g | Freq: Once | INTRAVENOUS | Status: AC
  Administered 2020-03-15: 12.5 g via INTRAVENOUS
  Filled 2020-03-15: qty 50

## 2020-03-15 MED ORDER — 0.9 % SODIUM CHLORIDE (POUR BTL) OPTIME
TOPICAL | Status: DC | PRN
  Administered 2020-03-15: 1000 mL

## 2020-03-15 MED ORDER — LIDOCAINE-EPINEPHRINE 1 %-1:100000 IJ SOLN
INTRAMUSCULAR | Status: AC
  Filled 2020-03-15: qty 1

## 2020-03-15 MED ORDER — PENTAFLUOROPROP-TETRAFLUOROETH EX AERO: 1.0000 "application " | INHALATION_SPRAY | CUTANEOUS | Status: DC | PRN

## 2020-03-15 MED ORDER — LINEZOLID 600 MG/300ML IV SOLN
600.0000 mg | Freq: Once | INTRAVENOUS | Status: AC
  Administered 2020-03-15: 600 mg via INTRAVENOUS
  Filled 2020-03-15: qty 300

## 2020-03-15 MED ORDER — HEPARIN SODIUM (PORCINE) 1000 UNIT/ML DIALYSIS
1000.0000 [IU] | INTRAMUSCULAR | Status: DC | PRN
  Administered 2020-03-15: 1000 [IU] via INTRAVENOUS_CENTRAL
  Filled 2020-03-15: qty 1

## 2020-03-15 SURGICAL SUPPLY — 49 items
ADH SKN CLS APL DERMABOND .7 (GAUZE/BANDAGES/DRESSINGS) ×2
ARMBAND PINK RESTRICT EXTREMIT (MISCELLANEOUS) ×6 IMPLANT
BIOPATCH BLUE 3/4IN DISK W/1.5 (GAUZE/BANDAGES/DRESSINGS) ×2 IMPLANT
BNDG ELASTIC 4X5.8 VLCR STR LF (GAUZE/BANDAGES/DRESSINGS) ×2 IMPLANT
CANISTER SUCT 3000ML PPV (MISCELLANEOUS) ×4 IMPLANT
CANNULA VESSEL 3MM 2 BLNT TIP (CANNULA) IMPLANT
CATH EMB 3FR 80CM (CATHETERS) ×2 IMPLANT
CATH EMB 4FR 80CM (CATHETERS) ×4 IMPLANT
CATH EMB 5FR 80CM (CATHETERS) IMPLANT
CATH PALINDROME-P 19CM W/VT (CATHETERS) ×2 IMPLANT
CLIP VESOCCLUDE MED 6/CT (CLIP) ×4 IMPLANT
CLIP VESOCCLUDE SM WIDE 6/CT (CLIP) ×4 IMPLANT
COVER PROBE W GEL 5X96 (DRAPES) ×2 IMPLANT
COVER WAND RF STERILE (DRAPES) ×2 IMPLANT
DERMABOND ADVANCED (GAUZE/BANDAGES/DRESSINGS) ×2
DERMABOND ADVANCED .7 DNX12 (GAUZE/BANDAGES/DRESSINGS) ×2 IMPLANT
DRAPE C-ARM 42X72 X-RAY (DRAPES) ×2 IMPLANT
DRAPE C-ARM MINI 42X72 WSTRAPS (DRAPES) ×2 IMPLANT
DRAPE CHEST BREAST 15X10 FENES (DRAPES) ×2 IMPLANT
ELECT REM PT RETURN 9FT ADLT (ELECTROSURGICAL) ×4
ELECTRODE REM PT RTRN 9FT ADLT (ELECTROSURGICAL) ×2 IMPLANT
GAUZE SPONGE 4X4 12PLY STRL LF (GAUZE/BANDAGES/DRESSINGS) ×2 IMPLANT
GLOVE BIO SURGEON STRL SZ7.5 (GLOVE) ×4 IMPLANT
GLOVE SURG SS PI 6.5 STRL IVOR (GLOVE) ×2 IMPLANT
GOWN STRL REUS W/ TWL LRG LVL3 (GOWN DISPOSABLE) ×4 IMPLANT
GOWN STRL REUS W/ TWL XL LVL3 (GOWN DISPOSABLE) ×2 IMPLANT
GOWN STRL REUS W/TWL LRG LVL3 (GOWN DISPOSABLE) ×8
GOWN STRL REUS W/TWL XL LVL3 (GOWN DISPOSABLE) ×4
INSERT FOGARTY SM (MISCELLANEOUS) ×6 IMPLANT
KIT BASIN OR (CUSTOM PROCEDURE TRAY) ×4 IMPLANT
KIT TURNOVER KIT B (KITS) ×4 IMPLANT
NS IRRIG 1000ML POUR BTL (IV SOLUTION) ×4 IMPLANT
PACK CV ACCESS (CUSTOM PROCEDURE TRAY) ×4 IMPLANT
PAD ARMBOARD 7.5X6 YLW CONV (MISCELLANEOUS) ×8 IMPLANT
SET MICROPUNCTURE 5F STIFF (MISCELLANEOUS) ×2 IMPLANT
SPONGE GAUZE 2X2 8PLY STER LF (GAUZE/BANDAGES/DRESSINGS) ×1
SPONGE GAUZE 2X2 8PLY STRL LF (GAUZE/BANDAGES/DRESSINGS) ×1 IMPLANT
SUT ETHILON 3 0 PS 1 (SUTURE) ×2 IMPLANT
SUT GORETEX 6.0 TH-9 30 IN (SUTURE) ×2 IMPLANT
SUT GORETEX CV-6TTC-13 36IN (SUTURE) ×2 IMPLANT
SUT MNCRL AB 4-0 PS2 18 (SUTURE) ×2 IMPLANT
SUT PROLENE 6 0 BV (SUTURE) ×6 IMPLANT
SUT VIC AB 3-0 SH 27 (SUTURE) ×4
SUT VIC AB 3-0 SH 27X BRD (SUTURE) ×2 IMPLANT
SUT VICRYL 4-0 PS2 18IN ABS (SUTURE) ×4 IMPLANT
SYR TB 1ML LUER SLIP (SYRINGE) ×2 IMPLANT
TOWEL GREEN STERILE (TOWEL DISPOSABLE) ×4 IMPLANT
UNDERPAD 30X30 (UNDERPADS AND DIAPERS) ×4 IMPLANT
WATER STERILE IRR 1000ML POUR (IV SOLUTION) ×4 IMPLANT

## 2020-03-15 NOTE — Op Note (Signed)
    NAME: [name]    MRN: [id number] DOB: 15-Nov-1976    DATE OF OPERATION: 03/15/2020  PREOP DIAGNOSIS:    ESRD  POSTOP DIAGNOSIS:    Same  PROCEDURE:    Attempted thrombectomy of left forearm AV graft Ultrasound-guided placement of right IJ 19 cm palindrome catheter  SURGEON: Judeth Cornfield. Scot Dock, MD  ASSIST: Laurence Slate, PA  ANESTHESIA: General  EBL: Minimal  INDICATIONS:    [name] is a 44 y.o. female who presented with a clotted graft.  She had hyperkalemia and had a temporary femoral catheter placed for dialysis and underwent dialysis.  We were asked to attempt thrombectomy of her graft and place a catheter if needed.  She had multiple previous procedures in this graft with multiple stents in the past.  FINDINGS:   Given the extensive stents on both the arterial and venous limbs of the graft was unable to successfully perform thrombectomy of the graft.  Therefore a right IJ 19 cm tunneled dialysis catheter was placed.  TECHNIQUE:   The patient was taken to the operating room and received a general anesthetic.  The left arm was prepped and draped in usual sterile fashion.  Made a an incision over the distal loop of the graft as this is the only place where the graft was soft enough that I I thought that I could clamp it.  The graft was exposed here and I did open this transversely.  After multiple attempts with both a 3 and a 4 Fogarty catheter I was unable to pass the catheter up either limb.  The patient on exam has evidence of stents throughout both sides and a markedly degenerative graft.  Therefore each and I oversewed the graft with a running 6-0 Prolene suture.  Hemostasis was obtained in the wound and the wound was closed with a deep layer of 3-0 Vicryl and the skin closed with 4-0 Vicryl.  Attention was then turned to placement of the catheter.  Both IJ's appear to be patent by ultrasound although there was some collaterals in the right neck.   However I felt it would be worth trying on the right side first.  The neck and upper chest were prepped and draped in usual sterile fashion.  Under ultrasound guidance, after the skin was anesthetized I cannulated the right IJ.  A micropuncture sheath was introduced over the wire and then a J-wire was advanced into the right atrium and the tract dilated.  The catheter was brought through a tunnel and then the dilator and peel-away sheath advanced over the wire.  The dilator and wire were removed.  The cath was passed through the peel-away sheath and positioned in the right atrium.  Both ports withdrew easily with and flushed with heparinized saline and filled with concentrated heparin.  The catheter was secured at its exit site with a 3-0 nylon suture.  The IJ cannulation site was closed with a 4-0 subcuticular stitch.  Sterile dressing was applied.  Patient tolerated the procedure well.  All needle and sponge counts were correct.  Of note in recovery room I remove the femoral temporary dialysis catheter and pressure was held for hemostasis.  [name], MD, FACS [facility]  DATE OF DICTATION:   03/15/2020

## 2020-03-15 NOTE — Plan of Care (Signed)
  Problem: Pain Managment: Goal: General experience of comfort will improve Outcome: Progressing   

## 2020-03-15 NOTE — Progress Notes (Signed)
Patient ID: [name], female   DOB: 03/14/1976, 44 y.o.   MRN: [id number]  [facility] Progress Note   Assessment/ Plan:   1. Left upper arm thrombosed loop graft: Unfortunately presented to emergency room yesterday on direction from her dialysis unit after they were unable to set up outpatient thrombectomy.  I highly appreciate assistance from critical care with placing her temporary femoral dialysis catheter for emergent hemodialysis to rectify hyperkalemia as well as vascular surgery who will attempt thrombectomy there after and placement of a tunneled catheter if unsuccessful. 2. ESRD: She did not have hemodialysis since 4/22 and is undergoing emergent hemodialysis at this time for management of hyperkalemia/volume prior to attempted thrombectomy. 3. Anemia: Hemoglobin and hematocrit within acceptable range, no overt loss.  4. CKD-MBD: N.p.o. at this time for anticipated thrombectomy, continue renal diet and resume binders. 5.  Hyperkalemia: Secondary to graft thrombosis and lack of dialysis since 4/22-hemodialysis. 6. Hypertension: Elevated blood pressure noted, monitor with hemodialysis/ultrafiltration.  Subjective:   Reports some anxiety surrounding hospitalization and attempted thrombectomy.   Objective:   BP (!) 168/95   Pulse 85   Temp 97.6 F (36.4 C) (Oral)   Resp 15   SpO2 99%   Physical Exam: Gen: Appears comfortable on dialysis via right femoral catheter CVS: Pulse regular rhythm, normal rate, S1 and S2 normal Resp: Clear to auscultation, no rales/rhonchi Abd: Soft, obese, nontender Ext: Left lower extremity 2+ edema, right lower extremity trace to 1+ edema. Left upper arm AVG thrombosed  Labs: BMET Recent Labs  Lab 03/14/20 1219 03/15/20 0352  NA 138 136  K 6.3* 6.9*  CL 100 98  CO2 19* 19*  GLUCOSE 133* 246*  BUN 98* 108*  CREATININE 12.19* 13.59*  CALCIUM 8.8* 8.3*  PHOS  --  8.4*   CBC Recent Labs  Lab 03/14/20 1219  03/15/20 0352  WBC 14.2* 15.3*  HGB 10.5* 10.3*  HCT 34.7* 33.6*  MCV 93.5 92.1  PLT 119* 113*     Medications:    . calcium acetate (Phos Binder)  667 mg Oral TID WC & HS  . Chlorhexidine Gluconate Cloth  6 each Topical Q0600  . diltiazem  240 mg Oral QHS  . heparin sodium (porcine)  4,000 Units Intravenous STAT  . hydrALAZINE  25 mg Oral Daily  . lanthanum  1,000 mg Oral See admin instructions  . multivitamin  1 tablet Oral QHS  . pantoprazole  40 mg Oral Daily  . PARoxetine  30 mg Oral Daily  . predniSONE  10 mg Oral Q breakfast  . sevelamer carbonate  800 mg Oral Daily  . traZODone  50 mg Oral QHS   Elmarie Shiley, MD 03/15/2020, 11:09 AM

## 2020-03-15 NOTE — Procedures (Signed)
Patient seen on Hemodialysis. BP (!) 168/95   Pulse 85   Temp 97.6 F (36.4 C) (Oral)   Resp 15   SpO2 99%   QB 400, UF goal 3L Tolerating treatment without complaints at this time.   Elmarie Shiley MD [facility]. Office # [phone] Pager # [phone] 11:24 AM

## 2020-03-15 NOTE — Progress Notes (Signed)
PROGRESS NOTE  [name]  DOB: 1976-11-13  PCP: Leeanne Rio, MD IRS:[id number]  DOA: 03/14/2020  LOS: 0 days   Chief Complaint  Patient presents with  . Vascular Access Problem   Brief narrative: [name] is a 44 y.o. female with history of ESRD on hemodialysis on Tuesday Thursdays and Saturday, hypertension lupus on prednisone. Patient presented to the ED on 4/26 with clotted left AV fistula. Patient last had dialysis on Thursday 4/22.  On 4/24, Saturday she was found to have clotted left AV fistula and the plan was to contact vascular surgery but apparently that did not happen.   Patient did not have any chest pain shortness of breath but does feel some weakness.    In the ER, patient was hemodynamically stable.  Labs showed potassium elevated to 6.3 with EKG showing some concerning T wave changes for hyperkalemia, creatinine was 12.1.  Covid test was negative.   On-call vascular surgeon Dr. Carlis Abbott and Dr. Moshe Cipro of nephrology were consulted.  With a plan to declot the fistula in the morning, patient was admitted to hospitalist service. Patient was given Lokelma IV insulin and D50 for the hyperkalemia.    Subjective: Patient was seen and examined this morning in the ER.  Pleasant young African-American female.  Lying down in bed.  Critical care trying to obtain a femoral access.  Plan for dialysis soon after that. Patient denies any complaints.  Patient states he does not have a diagnosis of diabetes mellitus.  Assessment/Plan: Clotted AV fistula -Vascular surgery plans to declot it today.  ESRD on hemodialysis on Tuesday Thursday Saturday Hyperkalemia  -6.3 on presentation. Patient received Lokelma D50 IV insulin -Repeat potassium level this morning even higher at 6.9.  Lokelma was repeated this morning. -Patient is getting dialysis access at this time.  Plan is to dialyze her soon after that.  Chills and rigors with no fever -Blood culture  sent.  Empiric antibiotics started.  Hypertensive urgency  -Blood pressure elevated up to 180s. -Home meds include Cardizem, hydralazine, lisinopril. -Continue Cardizem and hydralazine.  Lisinopril remains on hold because of hyperkalemia.  -Continue to monitor blood pressure.  IV hydralazine as needed as well.  History of lupus -Continue prednisone.   Diabetes mellitus -A1c 7.4 on 4/27 -Patient denies any history of diabetes mellitus.   -On chart review I noted that her A1c in 2012 was 6.6.  She has been chronically on prednisone for lupus.  -Not on any diabetes medications at home.  A1c 7.4 today.  -We will discuss with nephrology about starting an oral agent.  Does not need insulin at this time.  Continue other home medicines including Paxil, trazodone, Protonix  DVT prophylaxis: SCDs for now.  Anticipation of procedure will hold off anticoagulation. Code Status: Full code. Family Communication: Discussed with patient. Disposition Plan: Home. Consults called: Vascular surgery and nephrology. Admission status: Observation.    There is no height or weight on file to calculate BMI. Mobility: Encourage ambulation Code Status:  Full code  DVT prophylaxis:  SCDs for now Antimicrobials:  On broad-spectrum empiric antibiotics Fluid: None Diet: Patient is n.p.o. at this time pending procedure.  Postprocedure, she can be started on renal diet.  Consultants: Vascular surgery, critical care, nephrology Family Communication:  Not at bedside.  Status is: Observation  The patient will require care spanning > 2 midnights and should be moved to inpatient because: Persistent severe electrolyte disturbances, Ongoing diagnostic testing needed not appropriate for outpatient work up  and Inpatient level of care appropriate due to severity of illness  Dispo: The patient is from: Home              Anticipated d/c is to: Home              Anticipated d/c date is: 2 days              Patient  currently is not medically stable to d/c.   Antimicrobials: Anti-infectives (From admission, onward)   Start     Dose/Rate Route Frequency Ordered Stop   03/15/20 0800  ceFEPIme (MAXIPIME) 1 g in sodium chloride 0.9 % 100 mL IVPB     1 g 200 mL/hr over 30 Minutes Intravenous Every 24 hours 03/15/20 0645     03/15/20 0700  linezolid (ZYVOX) IVPB 600 mg     600 mg 300 mL/hr over 60 Minutes Intravenous  Once 03/15/20 0644          Code Status: Full Code   Diet Order            Diet NPO time specified Except for: Sips with Meds, Ice Chips  Diet effective midnight              Infusions:  . ceFEPime (MAXIPIME) IV    . linezolid (ZYVOX) IV      Scheduled Meds: . calcium acetate (Phos Binder)  667 mg Oral TID WC & HS  . Chlorhexidine Gluconate Cloth  6 each Topical Q0600  . diltiazem  240 mg Oral QHS  . hydrALAZINE  25 mg Oral Daily  . lanthanum  1,000 mg Oral See admin instructions  . multivitamin  1 tablet Oral QHS  . pantoprazole  40 mg Oral Daily  . PARoxetine  30 mg Oral Daily  . predniSONE  10 mg Oral Q breakfast  . sevelamer carbonate  800 mg Oral Daily  . traZODone  50 mg Oral QHS    PRN meds: hydrALAZINE   Objective: Vitals:   03/15/20 0530 03/15/20 0600  BP: (!) 150/98 (!) 144/103  Pulse: 89 85  Resp: 17 19  Temp:    SpO2: 98% 99%   No intake or output data in the 24 hours ending 03/15/20 0813 There were no vitals filed for this visit. Weight change:  There is no height or weight on file to calculate BMI.   Physical Exam: General exam: Appears calm and comfortable.  Skin: No rashes, lesions or ulcers. HEENT: Atraumatic, normocephalic, supple neck, no obvious bleeding Lungs: Clear to auscultation bilaterally CVS: Regular rate and rhythm, no murmur GI/Abd soft, nontender, nondistended, bowel sound present CNS: Alert, awake, oriented x3 Psychiatry: Mood appropriate Extremities: No pedal edema, no calf tenderness  Data Review: I have personally  reviewed the laboratory data and studies available.  Recent Labs  Lab 03/14/20 1219 03/15/20 0352  WBC 14.2* 15.3*  HGB 10.5* 10.3*  HCT 34.7* 33.6*  MCV 93.5 92.1  PLT 119* 113*   Recent Labs  Lab 03/14/20 1219 03/15/20 0352  NA 138 136  K 6.3* 6.9*  CL 100 98  CO2 19* 19*  GLUCOSE 133* 246*  BUN 98* 108*  CREATININE 12.19* 13.59*  CALCIUM 8.8* 8.3*  PHOS  --  8.4*    Signed, Terrilee Croak, MD [facility] Pager: [phone] (Secure Chat preferred). 03/15/2020

## 2020-03-15 NOTE — ED Notes (Signed)
Received call from lab regarding pt's 6.9 potassium. Dr. Hal Hope notified and new orders received.

## 2020-03-15 NOTE — Progress Notes (Signed)
Pharmacy Antibiotic Note  [name] is a 44 y.o. female with ESRD on HD admitted on 03/14/2020 with chills/rigors, possible sepsis .  Pharmacy has been consulted for Cefepime  dosing.  Plan: Cefepime 1 g IV q24h     Temp (24hrs), Avg:97.9 F (36.6 C), Min:97.6 F (36.4 C), Max:98.1 F (36.7 C)  Recent Labs  Lab 03/14/20 1219 03/15/20 0352  WBC 14.2* 15.3*  CREATININE 12.19* 13.59*    CrCl cannot be calculated (Unknown ideal weight.).    Allergies  Allergen Reactions  . Amlodipine Hives    Swelling  . Sulfa Antibiotics Hives and Itching  . Vancomycin Hives and Itching  . Venlafaxine Other (See Comments)    Emotional    Caryl Pina 03/15/2020 6:24 AM

## 2020-03-15 NOTE — Transfer of Care (Signed)
Immediate Anesthesia Transfer of Care Note  Patient: [name]  Procedure(s) Performed: ATTEMPTED THROMBECTOMY OF LEFT ARM ARTERIOVENTOUS (AV) GORETEX  GRAFT (Left Arm Lower) Insertion Of Dialysis Catheter (Right Neck)  Patient Location: PACU  Anesthesia Type:General  Level of Consciousness: drowsy, patient cooperative and responds to stimulation  Airway & Oxygen Therapy: Patient Spontanous Breathing  Post-op Assessment: Report given to RN and Post -op Vital signs reviewed and stable  Post vital signs: Reviewed and stable  Last Vitals:  Vitals Value Taken Time  BP 149/102 03/15/20 1742  Temp    Pulse 106 03/15/20 1743  Resp 14 03/15/20 1743  SpO2 98 % 03/15/20 1743  Vitals shown include unvalidated device data.  Last Pain:  Vitals:   03/15/20 1434  TempSrc: Oral  PainSc: 0-No pain         Complications: No apparent anesthesia complications

## 2020-03-15 NOTE — Anesthesia Preprocedure Evaluation (Signed)
Anesthesia Evaluation  Patient identified by MRN, date of birth, ID band Patient awake    Reviewed: Allergy & Precautions, NPO status , Patient's Chart, lab work & pertinent test results  Airway Mallampati: II  TM Distance: >3 FB Neck ROM: Full    Dental  (+) Dental Advisory Given   Pulmonary neg pulmonary ROS,    breath sounds clear to auscultation       Cardiovascular hypertension, Pt. on medications  Rhythm:Regular Rate:Normal     Neuro/Psych negative neurological ROS     GI/Hepatic Neg liver ROS, PUD, GERD  ,  Endo/Other  negative endocrine ROS  Renal/GU ESRF and DialysisRenal disease     Musculoskeletal  (+) Arthritis ,   Abdominal   Peds  Hematology  (+) anemia ,   Anesthesia Other Findings   Reproductive/Obstetrics                             Anesthesia Physical Anesthesia Plan  ASA: III  Anesthesia Plan: General   Post-op Pain Management:    Induction: Intravenous  PONV Risk Score and Plan: 3 and Dexamethasone, Ondansetron and Treatment may vary due to age or medical condition  Airway Management Planned: LMA  Additional Equipment: None  Intra-op Plan:   Post-operative Plan: Extubation in OR  Informed Consent: I have reviewed the patients History and Physical, chart, labs and discussed the procedure including the risks, benefits and alternatives for the proposed anesthesia with the patient or authorized representative who has indicated his/her understanding and acceptance.     Dental advisory given  Plan Discussed with:   Anesthesia Plan Comments:         Anesthesia Quick Evaluation

## 2020-03-15 NOTE — ED Notes (Signed)
Critical care at bedside  

## 2020-03-15 NOTE — Anesthesia Procedure Notes (Signed)
Procedure Name: LMA Insertion Date/Time: 03/15/2020 3:59 PM Performed by: Alain Marion, CRNA Pre-anesthesia Checklist: Patient identified, Emergency Drugs available, Suction available and Patient being monitored Patient Re-evaluated:Patient Re-evaluated prior to induction Oxygen Delivery Method: Circle System Utilized Preoxygenation: Pre-oxygenation with 100% oxygen Induction Type: IV induction LMA: LMA inserted LMA Size: 4.0 Number of attempts: 1 Airway Equipment and Method: Bite block Placement Confirmation: positive ETCO2 Tube secured with: Tape Dental Injury: Teeth and Oropharynx as per pre-operative assessment

## 2020-03-15 NOTE — Procedures (Signed)
Hemodialysis Catheter Insertion Procedure Note [name] [id number] 1976-08-11  Procedure: Insertion of Hemodialysis Catheter Indications: Dialysis Access   Procedure Details Consent: Risks of procedure as well as the alternatives and risks of each were explained to the (patient/caregiver).  Consent for procedure obtained. Time Out: Verified patient identification, verified procedure, site/side was marked, verified correct patient position, special equipment/implants available, medications/allergies/relevent history reviewed, required imaging and test results available.  Performed Verbal consent confirmed by the patient at bedside  But done emergently for urgent HD  Real time [place] used to ID and cannulate vessel  Maximum sterile technique was used including antiseptics, cap, gloves, gown, hand hygiene, mask and sheet. Skin prep: Chlorhexidine; local anesthetic administered Triple lumen hemodialysis catheter was inserted into right femoral vein due to patient being a dialysis patient and emergent situation using the Seldinger technique.  Evaluation Blood flow good Complications: No apparent complications Patient did tolerate procedure well. Chest X-ray ordered to verify placement.  CXR: not indicated .   Clementeen Graham 03/15/2020

## 2020-03-16 DIAGNOSIS — D696 Thrombocytopenia, unspecified: Secondary | ICD-10-CM | POA: Diagnosis present

## 2020-03-16 DIAGNOSIS — E8889 Other specified metabolic disorders: Secondary | ICD-10-CM | POA: Diagnosis present

## 2020-03-16 DIAGNOSIS — I120 Hypertensive chronic kidney disease with stage 5 chronic kidney disease or end stage renal disease: Secondary | ICD-10-CM | POA: Diagnosis present

## 2020-03-16 DIAGNOSIS — E1165 Type 2 diabetes mellitus with hyperglycemia: Secondary | ICD-10-CM | POA: Diagnosis present

## 2020-03-16 DIAGNOSIS — D631 Anemia in chronic kidney disease: Secondary | ICD-10-CM | POA: Diagnosis present

## 2020-03-16 DIAGNOSIS — Z825 Family history of asthma and other chronic lower respiratory diseases: Secondary | ICD-10-CM | POA: Diagnosis not present

## 2020-03-16 DIAGNOSIS — E875 Hyperkalemia: Secondary | ICD-10-CM | POA: Diagnosis present

## 2020-03-16 DIAGNOSIS — E785 Hyperlipidemia, unspecified: Secondary | ICD-10-CM | POA: Diagnosis present

## 2020-03-16 DIAGNOSIS — E1122 Type 2 diabetes mellitus with diabetic chronic kidney disease: Secondary | ICD-10-CM | POA: Diagnosis present

## 2020-03-16 DIAGNOSIS — T82898A Other specified complication of vascular prosthetic devices, implants and grafts, initial encounter: Secondary | ICD-10-CM | POA: Diagnosis not present

## 2020-03-16 DIAGNOSIS — T82868A Thrombosis of vascular prosthetic devices, implants and grafts, initial encounter: Secondary | ICD-10-CM | POA: Diagnosis present

## 2020-03-16 DIAGNOSIS — Y832 Surgical operation with anastomosis, bypass or graft as the cause of abnormal reaction of the patient, or of later complication, without mention of misadventure at the time of the procedure: Secondary | ICD-10-CM | POA: Diagnosis present

## 2020-03-16 DIAGNOSIS — Z7952 Long term (current) use of systemic steroids: Secondary | ICD-10-CM | POA: Diagnosis not present

## 2020-03-16 DIAGNOSIS — I160 Hypertensive urgency: Secondary | ICD-10-CM | POA: Diagnosis present

## 2020-03-16 DIAGNOSIS — Z20822 Contact with and (suspected) exposure to covid-19: Secondary | ICD-10-CM | POA: Diagnosis present

## 2020-03-16 DIAGNOSIS — Z992 Dependence on renal dialysis: Secondary | ICD-10-CM | POA: Diagnosis not present

## 2020-03-16 DIAGNOSIS — N186 End stage renal disease: Secondary | ICD-10-CM | POA: Diagnosis present

## 2020-03-16 DIAGNOSIS — Z8249 Family history of ischemic heart disease and other diseases of the circulatory system: Secondary | ICD-10-CM | POA: Diagnosis not present

## 2020-03-16 NOTE — Care Management Obs Status (Signed)
[place] NOTIFICATION   Patient Details  Name: [name] MRN: [id number] Date of Birth: 1976-04-23   Medicare Observation Status Notification Given:  Yes    Carles Collet, RN 03/16/2020, 7:53 AM

## 2020-03-16 NOTE — Progress Notes (Addendum)
  Progress Note    03/16/2020 8:00 AM 1 Day Post-Op  Subjective:  Mild right neck/ chest soreness   Vitals:   03/16/20 0331 03/16/20 0733  BP: 131/87 (!) 145/94  Pulse: 90 94  Resp: 20 17  Temp: 98 F (36.7 C) 98.3 F (36.8 C)  SpO2: 95% 97%   Physical Exam: Cardiac:  regular Lungs: non labored Incisions: right IJ TDC site clean, dry and intact. Left arm ACE bandage clean, dry and intact Extremities: 2+ left radial pulse, left arm edematous, no tenderness. 5/5 grip strength Abdomen:  Soft non tender Neurologic: alert and oriented  CBC    Component Value Date/Time   WBC 15.3 (H) 03/15/2020 0352   RBC 3.65 (L) 03/15/2020 0352   HGB 10.3 (L) 03/15/2020 0352   HCT 33.6 (L) 03/15/2020 0352   PLT 113 (L) 03/15/2020 0352   MCV 92.1 03/15/2020 0352   MCH 28.2 03/15/2020 0352   MCHC 30.7 03/15/2020 0352   RDW 19.1 (H) 03/15/2020 0352   LYMPHSABS 0.4 (L) 09/24/2019 0954   MONOABS 0.9 09/24/2019 0954   EOSABS 0.0 09/24/2019 0954   BASOSABS 0.1 09/24/2019 0954    BMET    Component Value Date/Time   NA 140 03/15/2020 1445   K 4.0 03/15/2020 1445   CL 98 03/15/2020 1445   CO2 23 03/15/2020 1445   GLUCOSE 87 03/15/2020 1445   BUN 27 (H) 03/15/2020 1445   CREATININE 5.75 (H) 03/15/2020 1445   CALCIUM 8.7 (L) 03/15/2020 1445   CALCIUM 8.5 06/21/2011 0622   GFRNONAA 8 (L) 03/15/2020 1445   GFRAA 10 (L) 03/15/2020 1445    INR    Component Value Date/Time   INR 1.20 03/17/2016 0638     Intake/Output Summary (Last 24 hours) at 03/16/2020 0800 Last data filed at 03/15/2020 2045 Gross per 24 hour  Intake 798.9 ml  Output 2220 ml  Net -1421.1 ml     Assessment/Plan:  44 y.o. female is s/p attempted thrombectomy of left forearm AV graft and ultrasound guided placement of right IJ Palidrome catheter 1 Day Post-Op. Discussed with patient unsuccessful declot of her AV graft as she was unsure of what was found yesterday. Her TDC site and left upper extremity dressings  are clean dry and intact. Her hyperkalemia resolved following emergent HD yesterday. She will dialyze again 4/29. Likely will be okay to follow up as an outpatient for further planning of long term dialysis access   Karoline Caldwell, PA-C [facility] [phone] 03/16/2020 8:00 AM   I have seen and evaluated the patient. I agree with the PA note as documented above.  Unsuccessful declot of left upper arm loop graft by Dr. Scot Dock.  She has had multiple interventions with stents of the venous outflow.  Ultimately had a tunneled dialysis catheter placed will arrange outpatient follow-up with vein mapping to discuss right arm placement.  [name], MD [facility] [place] Office: [phone]

## 2020-03-16 NOTE — Anesthesia Postprocedure Evaluation (Signed)
Anesthesia Post Note  Patient: [name]  Procedure(s) Performed: ATTEMPTED THROMBECTOMY OF LEFT ARM ARTERIOVENTOUS (AV) GORETEX  GRAFT (Left Arm Lower) Insertion Of Dialysis Catheter (Right Neck)     Patient location during evaluation: PACU Anesthesia Type: General Level of consciousness: awake and alert Pain management: pain level controlled Vital Signs Assessment: post-procedure vital signs reviewed and stable Respiratory status: spontaneous breathing, nonlabored ventilation, respiratory function stable and patient connected to nasal cannula oxygen Cardiovascular status: blood pressure returned to baseline and stable Postop Assessment: no apparent nausea or vomiting Anesthetic complications: no    Last Vitals:  Vitals:   03/16/20 0733 03/16/20 1132  BP: (!) 145/94 (!) 156/97  Pulse: 94 95  Resp: 17   Temp: 36.8 C   SpO2: 97%     Last Pain:  Vitals:   03/16/20 0733  TempSrc: Oral  PainSc:                  Effie Berkshire

## 2020-03-16 NOTE — Discharge Summary (Signed)
PATIENT DETAILS Name: [name] Age: 44 y.o. Sex: female Date of Birth: [birth year] MRN: [id number]. Admitting Physician: Rise Patience, MD BOF:[id number], Nicole Kindred, MD  Admit Date: 03/14/2020 Discharge date: 03/16/2020  Recommendations for Outpatient Follow-up:  1. Follow up with PCP in 1-2 weeks 2. Please obtain CMP/CBC in one week 3. Follow blood culture unitl negative   Admitted From:  Home  Disposition: [place]: No  Equipment/Devices: None  Discharge Condition: Stable  CODE STATUS: FULL CODE  Diet recommendation:  Diet Order            Diet - low sodium heart healthy        Diet Carb Modified        Diet renal with fluid restriction Fluid restriction: 1200 mL Fluid; Room service appropriate? Yes; Fluid consistency: Thin  Diet effective now               Brief Summary: See H&P, Labs, Consult and Test reports for all details in brief, Mary Beahm Tottenis a 44 y.o.femalewithhistory of ESRD on hemodialysis on Tuesday Thursdays and Saturday, hypertension lupus on prednisone who t presented to the ED on 4/26 with clotted left AV fistula  Brief Hospital Course: Clotted AV fistula: unsuccessful thrombectomy by VVS-now has Right IJ HD catheter  ESRD on hemodialysis on Tuesday Thursday Saturday: resume outpatient HD  Hyperkalemia:resolved with HD  Chills and rigors with no fever: blood cultures negative (called micro lab)-no fever-no foci of infection apparent-stop all antibiotics and observe.PCP to follow cultures until final  Hypertensive urgency:BP better-resume Cardizem, hydralazine, lisinopril.  History of lupus:Continue prednisone.   Diabetes mellitus -A1c 7.4 on 4/27: follow with PCP for further care  Continue other home medicines including Paxil, trazodone, Protonix  Procedures 4/27>> Attempted thrombectomy of left forearm AV graft Ultrasound-guided placement of right IJ 19 cm palindrome  catheter  4/27>>Right femoral HD catheter  Discharge Diagnoses:  Principal Problem:   AV fistula occlusion, initial encounter [facility]) Active Problems:   ESRD on dialysis [facility])   Essential hypertension   Discharge Instructions:  Activity:  As tolerated   Discharge Instructions    Call MD for:  redness, tenderness, or signs of infection (pain, swelling, bleeding, redness, odor or green/yellow discharge around incision site)   Complete by: As directed    Call MD for:  severe or increased pain, loss or decreased feeling  in affected limb(s)   Complete by: As directed    Call MD for:  temperature >100.5   Complete by: As directed    Diet - low sodium heart healthy   Complete by: As directed    Diet Carb Modified   Complete by: As directed    Discharge instructions   Complete by: As directed    Keep catheter clean and dry   Follow with Primary MD  Leeanne Rio, MD in 1-2 weeks  Follow with your Dialysis center as previous  Please ask your primary MD or your renal MD to follow blood cultures until final-negative so far  Talk to your Primary care MD about starting medications for your diabetes  Please get a complete blood count and chemistry panel checked by your Primary MD at your next visit, and again as instructed by your Primary MD.  Get Medicines reviewed and adjusted: Please take all your medications with you for your next visit with your Primary MD  Laboratory/radiological data: Please request your Primary MD to go over all hospital tests and procedure/radiological results  at the follow up, please ask your Primary MD to get all Hospital records sent to his/her office.  In some cases, they will be blood work, cultures and biopsy results pending at the time of your discharge. Please request that your primary care M.D. follows up on these results.  Also Note the following: If you experience worsening of your admission symptoms, develop shortness of breath, life  threatening emergency, suicidal or homicidal thoughts you must seek medical attention immediately by calling 911 or calling your MD immediately  if symptoms less severe.  You must read complete instructions/literature along with all the possible adverse reactions/side effects for all the Medicines you take and that have been prescribed to you. Take any new Medicines after you have completely understood and accpet all the possible adverse reactions/side effects.   Do not drive when taking Pain medications or sleeping medications (Benzodaizepines)  Do not take more than prescribed Pain, Sleep and Anxiety Medications. It is not advisable to combine anxiety,sleep and pain medications without talking with your primary care practitioner  Special Instructions: If you have smoked or chewed Tobacco  in the last 2 yrs please stop smoking, stop any regular Alcohol  and or any Recreational drug use.  Wear Seat belts while driving.  Please note: You were cared for by a hospitalist during your hospital stay. Once you are discharged, your primary care physician will handle any further medical issues. Please note that NO REFILLS for any discharge medications will be authorized once you are discharged, as it is imperative that you return to your primary care physician (or establish a relationship with a primary care physician if you do not have one) for your post hospital discharge needs so that they can reassess your need for medications and monitor your lab values.   Increase activity slowly   Complete by: As directed    Resume previous diet   Complete by: As directed      Allergies as of 03/16/2020      Reactions   Amlodipine Hives   Swelling   Sulfa Antibiotics Hives, Itching   Vancomycin Hives, Itching   Venlafaxine Other (See Comments)   Emotional      Medication List    TAKE these medications   calcium acetate (Phos Binder) 667 MG/5ML Soln Commonly known as: PHOSLYRA Take 667 mg by mouth 4  (four) times daily -  with meals and at bedtime.   diltiazem 240 MG 24 hr capsule Commonly known as: TIAZAC Take 240 mg by mouth at bedtime.   hydrALAZINE 25 MG tablet Commonly known as: APRESOLINE TAKE 1 TABLET BY MOUTH TWICE DAILY What changed: when to take this   lanthanum 1000 MG chewable tablet Commonly known as: FOSRENOL Chew 1 tablet by mouth See admin instructions. 1 tablet with meals and 1/2 tablet with snacks   lisinopril 40 MG tablet Commonly known as: ZESTRIL Take 40 mg by mouth daily.   multivitamin Tabs tablet Take 1 tablet by mouth daily.   oxyCODONE-acetaminophen 5-325 MG tablet Commonly known as: PERCOCET/ROXICET Take 1 tablet by mouth every 6 (six) hours as needed.   pantoprazole 40 MG tablet Commonly known as: PROTONIX Take 1 tablet (40 mg total) by mouth daily.   PARoxetine 30 MG tablet Commonly known as: PAXIL Take 30 mg by mouth daily.   predniSONE 10 MG tablet Commonly known as: DELTASONE Take 1 tablet (10 mg total) by mouth daily with breakfast.   sevelamer carbonate 800 MG tablet Commonly known as: RENVELA  Take 800 mg by mouth in the morning and at bedtime. What changed: Another medication with the same name was removed. Continue taking this medication, and follow the directions you see here.   traZODone 50 MG tablet Commonly known as: DESYREL Take 0.5-1 tablets (25-50 mg total) by mouth at bedtime. What changed: how much to take      Follow-up Information    Angelia Mould, MD In 2 weeks.   Specialties: Vascular Surgery, Cardiology Why: Office will call you to arrange your appt (sent) Contact information: [address] [place] [address] [phone]          Allergies  Allergen Reactions  . Amlodipine Hives    Swelling  . Sulfa Antibiotics Hives and Itching  . Vancomycin Hives and Itching  . Venlafaxine Other (See Comments)    Emotional      Consultations:   VVS, Renal   Other Procedures/Studies: DG  Fluoro Guide CV Line-No Report  Result Date: 03/15/2020 Fluoroscopy was utilized by the requesting physician.  No radiographic interpretation.     TODAY-DAY OF DISCHARGE:  Subjective:   [name] today has no headache,no chest abdominal pain,no new weakness tingling or numbness, feels much better wants to go home today.   Objective:   Blood pressure (!) 145/94, pulse 94, temperature 98.3 F (36.8 C), temperature source Oral, resp. rate 17, weight 62 kg, SpO2 97 %.  Intake/Output Summary (Last 24 hours) at 03/16/2020 0931 Last data filed at 03/16/2020 0900 Gross per 24 hour  Intake 820 ml  Output 2220 ml  Net -1400 ml   Filed Weights   03/15/20 2100  Weight: 62 kg    Exam: Awake Alert, Oriented *3, No new F.N deficits, Normal affect [place].AT,PERRAL Supple Neck,No JVD, No cervical lymphadenopathy appriciated.  Symmetrical Chest wall movement, Good air movement bilaterally, CTAB RRR,No Gallops,Rubs or new Murmurs, No Parasternal Heave +ve B.Sounds, Abd Soft, Non tender, No organomegaly appriciated, No rebound -guarding or rigidity. No Cyanosis, Clubbing or edema, No new Rash or bruise   PERTINENT RADIOLOGIC STUDIES: DG Fluoro Guide CV Line-No Report  Result Date: 03/15/2020 Fluoroscopy was utilized by the requesting physician.  No radiographic interpretation.     PERTINENT LAB RESULTS: CBC: Recent Labs    03/14/20 1219 03/15/20 0352  WBC 14.2* 15.3*  HGB 10.5* 10.3*  HCT 34.7* 33.6*  PLT 119* 113*   CMET CMP     Component Value Date/Time   NA 140 03/15/2020 1445   K 4.0 03/15/2020 1445   CL 98 03/15/2020 1445   CO2 23 03/15/2020 1445   GLUCOSE 87 03/15/2020 1445   BUN 27 (H) 03/15/2020 1445   CREATININE 5.75 (H) 03/15/2020 1445   CALCIUM 8.7 (L) 03/15/2020 1445   CALCIUM 8.5 06/21/2011 0622   PROT 5.7 (L) 09/24/2019 0954   ALBUMIN 3.2 (L) 03/15/2020 0352   AST 15 09/24/2019 0954   ALT 17 09/24/2019 0954   ALKPHOS 73 09/24/2019 0954   BILITOT  0.4 09/24/2019 0954   GFRNONAA 8 (L) 03/15/2020 1445   GFRAA 10 (L) 03/15/2020 1445    GFR Estimated Creatinine Clearance: 10.8 mL/min (A) (by C-G formula based on SCr of 5.75 mg/dL (H)). No results for input(s): LIPASE, AMYLASE in the last 72 hours. No results for input(s): CKTOTAL, CKMB, CKMBINDEX, TROPONINI in the last 72 hours. Invalid input(s): POCBNP No results for input(s): DDIMER in the last 72 hours. Recent Labs    03/15/20 0352  HGBA1C 7.4*   No results for  input(s): CHOL, HDL, LDLCALC, TRIG, CHOLHDL, LDLDIRECT in the last 72 hours. No results for input(s): TSH, T4TOTAL, T3FREE, THYROIDAB in the last 72 hours.  Invalid input(s): FREET3 No results for input(s): VITAMINB12, FOLATE, FERRITIN, TIBC, IRON, RETICCTPCT in the last 72 hours. Coags: No results for input(s): INR in the last 72 hours.  Invalid input(s): PT Microbiology: Recent Results (from the past 240 hour(s))  Respiratory Panel by RT PCR (Flu A&B, Covid) - Nasopharyngeal Swab     Status: None   Collection Time: 03/14/20  5:56 PM   Specimen: Nasopharyngeal Swab  Result Value Ref Range Status   SARS Coronavirus 2 by RT PCR NEGATIVE NEGATIVE Final    Comment: (NOTE) SARS-CoV-2 target nucleic acids are NOT DETECTED. The SARS-CoV-2 RNA is generally detectable in upper respiratoy specimens during the acute phase of infection. The lowest concentration of SARS-CoV-2 viral copies this assay can detect is 131 copies/mL. A negative result does not preclude SARS-Cov-2 infection and should not be used as the sole basis for treatment or other patient management decisions. A negative result may occur with  improper specimen collection/handling, submission of specimen other than nasopharyngeal swab, presence of viral mutation(s) within the areas targeted by this assay, and inadequate number of viral copies (<131 copies/mL). A negative result must be combined with clinical observations, patient history, and  epidemiological information. The expected result is Negative. Fact Sheet for Patients:  PinkCheek.be Fact Sheet for Healthcare Providers:  GravelBags.it This test is not yet ap proved or cleared by the [place] FDA and  has been authorized for detection and/or diagnosis of SARS-CoV-2 by FDA under an Emergency Use Authorization (EUA). This EUA will remain  in effect (meaning this test can be used) for the duration of the COVID-19 declaration under Section 564(b)(1) of the Act, 21 U.S.C. section 360bbb-3(b)(1), unless the authorization is terminated or revoked sooner.    Influenza A by PCR NEGATIVE NEGATIVE Final   Influenza B by PCR NEGATIVE NEGATIVE Final    Comment: (NOTE) The Xpert Xpress SARS-CoV-2/FLU/RSV assay is intended as an aid in  the diagnosis of influenza from Nasopharyngeal swab specimens and  should not be used as a sole basis for treatment. Nasal washings and  aspirates are unacceptable for Xpert Xpress SARS-CoV-2/FLU/RSV  testing. Fact Sheet for Patients: PinkCheek.be Fact Sheet for Healthcare Providers: GravelBags.it This test is not yet approved or cleared by the [place] FDA and  has been authorized for detection and/or diagnosis of SARS-CoV-2 by  FDA under an Emergency Use Authorization (EUA). This EUA will remain  in effect (meaning this test can be used) for the duration of the  Covid-19 declaration under Section 564(b)(1) of the Act, 21  U.S.C. section 360bbb-3(b)(1), unless the authorization is  terminated or revoked. Performed at [facility], [place] [address], [place], [place] [address]   Culture, blood (routine x 2)     Status: None (Preliminary result)   Collection Time: 03/15/20  4:08 AM   Specimen: BLOOD RIGHT FOREARM  Result Value Ref Range Status   Specimen Description BLOOD RIGHT FOREARM UPPER  Final   Special  Requests   Final    BOTTLES DRAWN AEROBIC AND ANAEROBIC Blood Culture adequate volume   Culture   Final    NO GROWTH 1 DAY Performed at [facility], [place] [address], [place], [place] [address]    Report Status PENDING  Incomplete    FURTHER DISCHARGE INSTRUCTIONS:  Get Medicines reviewed and adjusted: Please take  all your medications with you for your next visit with your Primary MD  Laboratory/radiological data: Please request your Primary MD to go over all hospital tests and procedure/radiological results at the follow up, please ask your Primary MD to get all Hospital records sent to his/her office.  In some cases, they will be blood work, cultures and biopsy results pending at the time of your discharge. Please request that your primary care M.D. goes through all the records of your hospital data and follows up on these results.  Also Note the following: If you experience worsening of your admission symptoms, develop shortness of breath, life threatening emergency, suicidal or homicidal thoughts you must seek medical attention immediately by calling 911 or calling your MD immediately  if symptoms less severe.  You must read complete instructions/literature along with all the possible adverse reactions/side effects for all the Medicines you take and that have been prescribed to you. Take any new Medicines after you have completely understood and accpet all the possible adverse reactions/side effects.   Do not drive when taking Pain medications or sleeping medications (Benzodaizepines)  Do not take more than prescribed Pain, Sleep and Anxiety Medications. It is not advisable to combine anxiety,sleep and pain medications without talking with your primary care practitioner  Special Instructions: If you have smoked or chewed Tobacco  in the last 2 yrs please stop smoking, stop any regular Alcohol  and or any Recreational drug use.  Wear Seat belts while driving.  Please  note: You were cared for by a hospitalist during your hospital stay. Once you are discharged, your primary care physician will handle any further medical issues. Please note that NO REFILLS for any discharge medications will be authorized once you are discharged, as it is imperative that you return to your primary care physician (or establish a relationship with a primary care physician if you do not have one) for your post hospital discharge needs so that they can reassess your need for medications and monitor your lab values.  Total Time spent coordinating discharge including counseling, education and face to face time equals 25 minutes.  SignedOren Binet 03/16/2020 9:31 AM

## 2020-03-16 NOTE — Progress Notes (Signed)
[name] to be D/C'd Home per MD order. Discussed with the patient and all questions fully answered.  Allergies as of 03/16/2020      Reactions   Amlodipine Hives   Swelling   Sulfa Antibiotics Hives, Itching   Vancomycin Hives, Itching   Venlafaxine Other (See Comments)   Emotional      Medication List    TAKE these medications   calcium acetate (Phos Binder) 667 MG/5ML Soln Commonly known as: PHOSLYRA Take 667 mg by mouth 4 (four) times daily -  with meals and at bedtime.   diltiazem 240 MG 24 hr capsule Commonly known as: TIAZAC Take 240 mg by mouth at bedtime.   hydrALAZINE 25 MG tablet Commonly known as: APRESOLINE TAKE 1 TABLET BY MOUTH TWICE DAILY What changed: when to take this   lanthanum 1000 MG chewable tablet Commonly known as: FOSRENOL Chew 1 tablet by mouth See admin instructions. 1 tablet with meals and 1/2 tablet with snacks   lisinopril 40 MG tablet Commonly known as: ZESTRIL Take 40 mg by mouth daily.   multivitamin Tabs tablet Take 1 tablet by mouth daily.   oxyCODONE-acetaminophen 5-325 MG tablet Commonly known as: PERCOCET/ROXICET Take 1 tablet by mouth every 6 (six) hours as needed.   pantoprazole 40 MG tablet Commonly known as: PROTONIX Take 1 tablet (40 mg total) by mouth daily.   PARoxetine 30 MG tablet Commonly known as: PAXIL Take 30 mg by mouth daily.   predniSONE 10 MG tablet Commonly known as: DELTASONE Take 1 tablet (10 mg total) by mouth daily with breakfast.   sevelamer carbonate 800 MG tablet Commonly known as: RENVELA Take 800 mg by mouth in the morning and at bedtime. What changed: Another medication with the same name was removed. Continue taking this medication, and follow the directions you see here.   traZODone 50 MG tablet Commonly known as: DESYREL Take 0.5-1 tablets (25-50 mg total) by mouth at bedtime. What changed: how much to take       VVS, Skin clean, dry and intact without evidence of skin  break down, no evidence of skin tears noted.  IV catheter discontinued intact. Site without signs and symptoms of complications. Dressing and pressure applied.  An After Visit Summary was printed and given to the patient.  Patient escorted via [place], and D/C home via private auto.  Ascencion Dike  03/16/2020 1:50 PM

## 2020-03-20 LAB — CULTURE, BLOOD (ROUTINE X 2)
Culture: NO GROWTH
Culture: NO GROWTH
Special Requests: ADEQUATE
Special Requests: ADEQUATE

## 2020-04-01 ENCOUNTER — Other Ambulatory Visit: Payer: Self-pay | Admitting: *Deleted

## 2020-04-01 DIAGNOSIS — N186 End stage renal disease: Secondary | ICD-10-CM

## 2020-04-05 ENCOUNTER — Encounter (HOSPITAL_COMMUNITY): Payer: Medicare HMO

## 2020-04-13 ENCOUNTER — Other Ambulatory Visit: Payer: Self-pay

## 2020-04-13 ENCOUNTER — Ambulatory Visit (INDEPENDENT_AMBULATORY_CARE_PROVIDER_SITE_OTHER)
Admission: RE | Admit: 2020-04-13 | Discharge: 2020-04-13 | Disposition: A | Discharge status: Home / Self Care | Payer: Medicare HMO | Source: Ambulatory Visit | Attending: Family Medicine | Admitting: Family Medicine

## 2020-04-13 ENCOUNTER — Ambulatory Visit (INDEPENDENT_AMBULATORY_CARE_PROVIDER_SITE_OTHER): Payer: Medicare HMO | Admitting: Physician Assistant

## 2020-04-13 ENCOUNTER — Ambulatory Visit (HOSPITAL_COMMUNITY)
Admission: RE | Admit: 2020-04-13 | Discharge: 2020-04-13 | Disposition: A | Discharge status: Home / Self Care | Payer: Medicare HMO | Source: Ambulatory Visit | Attending: Vascular Surgery | Admitting: Vascular Surgery

## 2020-04-13 VITALS — BP 151/100 | HR 67 | Temp 97.0°F | Resp 16 | Ht 64.0 in | Wt 134.0 lb

## 2020-04-13 DIAGNOSIS — N186 End stage renal disease: Secondary | ICD-10-CM

## 2020-04-13 DIAGNOSIS — Z992 Dependence on renal dialysis: Secondary | ICD-10-CM

## 2020-04-13 NOTE — Progress Notes (Signed)
History of Present Illness:  Patient is a 44 y.o. year old female who presents for placement  hemodialysis access.  She recently had attempted thrombectomy left forearm AV graft that failed.  She had a TDC placed and scheduled for f/u vein mapping and plan for new access.       Past Medical History:  Diagnosis Date  . Allergy   . Anemia   . Arthritis   . Avascular necrosis of bone (HCC)    left tibial talus due to chronic prednisone use  . ESRD (end stage renal disease) on dialysis ([place])    tthsat [place]  . GERD (gastroesophageal reflux disease)   . Headache(784.0)   . Hyperlipemia   . Hypertension   . Lupus ([place])   . Secondary hyperparathyroidism (of renal origin)     Past Surgical History:  Procedure Laterality Date  . A/V FISTULAGRAM Left 12/02/2017   Procedure: A/V FISTULAGRAM - Left upper;  Surgeon: Angelia Mould, MD;  Location: [place] [facility];  Service: Cardiovascular;  Laterality: Left;  . AV FISTULA PLACEMENT Left   . AV FISTULA PLACEMENT Right 01/26/2014   Procedure: ARTERIOVENOUS (AV) FISTULA CREATION; ultrasound guided;  Surgeon: Conrad [place], MD;  Location: [place];  Service: Vascular;  Laterality: Right;  . ESOPHAGOGASTRODUODENOSCOPY N/A 03/17/2016   Procedure: ESOPHAGOGASTRODUODENOSCOPY (EGD);  Surgeon: Irene Shipper, MD;  Location: [facility] ENDOSCOPY;  Service: Endoscopy;  Laterality: N/A;  . FISTULOGRAM Left 09/24/2019   Procedure: FISTULOGRAM LEFT ARM;  Surgeon: [name], MD;  Location: [place];  Service: Vascular;  Laterality: Left;  . HEMATOMA EVACUATION Left 10/09/2013   Procedure: EVACUATION HEMATOMA;  Surgeon: Angelia Mould, MD;  Location: [place];  Service: Vascular;  Laterality: Left;  . HEMATOMA EVACUATION Left 09/24/2019   Procedure: EVACUATION HEMATOMA  LEFT ARM;  Surgeon: [name], MD;  Location: [place];  Service: Vascular;  Laterality: Left;  . INCISION AND DRAINAGE ABSCESS     gluteal  . INSERTION OF DIALYSIS  CATHETER N/A 10/09/2013   Procedure: INSERTION OF DIALYSIS CATHETER; ULTRASOUND GUIDED;  Surgeon: Angelia Mould, MD;  Location: [place];  Service: Vascular;  Laterality: N/A;  . INSERTION OF DIALYSIS CATHETER Left 09/24/2019   Procedure: INSERTION OF TUNNELLED DIALYSIS CATHETER - PALINDROME 15FR X 28CM;  Surgeon: [name], MD;  Location: [place];  Service: Vascular;  Laterality: Left;  . INSERTION OF DIALYSIS CATHETER Right 03/15/2020   Procedure: Insertion Of Dialysis Catheter;  Surgeon: Angelia Mould, MD;  Location: [place];  Service: Cardiovascular;  Laterality: Right;  . PERIPHERAL VASCULAR BALLOON ANGIOPLASTY Left 12/02/2017   Procedure: PERIPHERAL VASCULAR BALLOON ANGIOPLASTY;  Surgeon: Angelia Mould, MD;  Location: [place] CV [facility];  Service: Cardiovascular;  Laterality: Left;  . REVISION OF ARTERIOVENOUS GORETEX GRAFT Left 04/03/2019   Procedure: REVISION OF ARTERIOVENOUS GORETEX GRAFT PSEUDOANEURYSM;  Surgeon: Angelia Mould, MD;  Location: [place];  Service: Vascular;  Laterality: Left;  . SHUNTOGRAM N/A 01/07/2014   Procedure: fistulogram with possibe venoplasty left upper arm avg;  Surgeon: Angelia Mould, MD;  Location: [facility] CATH LAB;  Service: Cardiovascular;  Laterality: N/A;  . THROMBECTOMY AND REVISION OF ARTERIOVENTOUS (AV) GORETEX  GRAFT Left 03/15/2020   Procedure: ATTEMPTED THROMBECTOMY OF LEFT ARM ARTERIOVENTOUS (AV) GORETEX  GRAFT;  Surgeon: Angelia Mould, MD;  Location: [place];  Service: Cardiovascular;  Laterality: Left;  . tibiocalcaneal fusion  left Left   . ULTRASOUND GUIDANCE  FOR VASCULAR ACCESS  09/24/2019   Procedure: Ultrasound Guidance For Vascular Access;  Surgeon: [name], MD;  Location: [facility] OR;  Service: Vascular;;     Social History Social History   Tobacco Use  . Smoking status: Never Smoker  . Smokeless tobacco: Never Used  Substance Use Topics  . Alcohol use: No  . Drug use: No    Family  History Family History  Problem Relation Age of Onset  . Asthma Sister   . Hypertension Mother   . Heart attack Mother 36       died at 65  . COPD Maternal Uncle        prostate  . Mental illness Neg Hx     Allergies  Allergies  Allergen Reactions  . Amlodipine Hives    Swelling  . Sulfa Antibiotics Hives and Itching  . Vancomycin Hives and Itching  . Venlafaxine Other (See Comments)    Emotional     Current Outpatient Medications  Medication Sig Dispense Refill  . calcium acetate, Phos Binder, (PHOSLYRA) 667 MG/5ML SOLN Take 667 mg by mouth 4 (four) times daily -  with meals and at bedtime.     Marland Kitchen diltiazem (TIAZAC) 240 MG 24 hr capsule Take 240 mg by mouth at bedtime.     . hydrALAZINE (APRESOLINE) 25 MG tablet TAKE 1 TABLET BY MOUTH TWICE DAILY (Patient taking differently: Take 25 mg by mouth daily. ) 180 tablet 1  . lanthanum (FOSRENOL) 1000 MG chewable tablet Chew 1 tablet by mouth See admin instructions. 1 tablet with meals and 1/2 tablet with snacks    . lisinopril (ZESTRIL) 40 MG tablet Take 40 mg by mouth daily.    . multivitamin (RENA-VIT) TABS tablet Take 1 tablet by mouth daily. 30 tablet 0  . oxyCODONE-acetaminophen (PERCOCET/ROXICET) 5-325 MG tablet Take 1 tablet by mouth every 6 (six) hours as needed. 10 tablet 0  . pantoprazole (PROTONIX) 40 MG tablet Take 1 tablet (40 mg total) by mouth daily. 30 tablet 3  . PARoxetine (PAXIL) 30 MG tablet Take 30 mg by mouth daily.    . predniSONE (DELTASONE) 10 MG tablet Take 1 tablet (10 mg total) by mouth daily with breakfast. 90 tablet 3  . sevelamer carbonate (RENVELA) 800 MG tablet Take 800 mg by mouth in the morning and at bedtime.     . traZODone (DESYREL) 50 MG tablet Take 0.5-1 tablets (25-50 mg total) by mouth at bedtime. (Patient taking differently: Take 50 mg by mouth at bedtime. ) 90 tablet 3   No current facility-administered medications for this visit.    ROS:   General:  No weight loss, Fever,  chills  HEENT: No recent headaches, no nasal bleeding, no visual changes, no sore throat  Neurologic: No dizziness, blackouts, seizures. No recent symptoms of stroke or mini- stroke. No recent episodes of slurred speech, or temporary blindness.  Cardiac: No recent episodes of chest pain/pressure, no shortness of breath at rest.  No shortness of breath with exertion.  Denies history of atrial fibrillation or irregular heartbeat  Vascular: No history of rest pain in feet.  No history of claudication.  No history of non-healing ulcer, No history of DVT   Pulmonary: No home oxygen, no productive cough, no hemoptysis,  No asthma or wheezing  Musculoskeletal:  [ ]  Arthritis, [ ]  Low back pain,  [ ]  Joint pain  Hematologic:No history of hypercoagulable state.  No history of easy bleeding.  No history of anemia  Gastrointestinal: No hematochezia or melena,  No gastroesophageal reflux, no trouble swallowing  Urinary: [ ]  chronic Kidney disease, [x ] on HD - [ ]  MWF or [x ] TTHS, [ ]  Burning with urination, [ ]  Frequent urination, [ ]  Difficulty urinating;   Skin: No rashes  Psychological: No history of anxiety,  No history of depression   Physical Examination  Vitals:   04/13/20 1138  BP: (!) 151/100  Pulse: 67  Resp: 16  Temp: (!) 97 F (36.1 C)  TempSrc: Oral  SpO2: 100%  Weight: 134 lb (60.8 kg)  Height: 5\' 4"  (1.626 m)    Body mass index is 23 kg/m.  General:  Alert and oriented, no acute distress HEENT: Normal Neck: No bruit or JVD Pulmonary: Clear to auscultation bilaterally Cardiac: Regular Rate and Rhythm without murmur Gastrointestinal: Soft, non-tender, non-distended, no mass, no scars Skin: No rash Extremity Pulses:  2+ radial, brachial pulses bilaterally Musculoskeletal: left UE edema, healing incision  Neurologic: Upper and lower extremity motor 5/5 and symmetric  DATA:  She does not have acceptable veins on vein mapping in the right UE, she has good  arterial flow.  ASSESSMENT:  ESRD  PLAN: She will be scheduled right UE AV graft placement.  She has HD on TTS.  She agrees with this plan.  Roxy Horseman PA-C [facility] [place] [facility] [place] Office: [phone]  MD in clinic [place]

## 2020-04-22 ENCOUNTER — Encounter (HOSPITAL_COMMUNITY): Payer: Self-pay | Admitting: Vascular Surgery

## 2020-04-22 ENCOUNTER — Other Ambulatory Visit (HOSPITAL_COMMUNITY)
Admission: RE | Admit: 2020-04-22 | Discharge: 2020-04-22 | Disposition: A | Discharge status: Home / Self Care | Payer: Medicare Other | Source: Ambulatory Visit | Attending: Vascular Surgery | Admitting: Vascular Surgery

## 2020-04-22 DIAGNOSIS — Z20822 Contact with and (suspected) exposure to covid-19: Secondary | ICD-10-CM | POA: Diagnosis not present

## 2020-04-22 DIAGNOSIS — Z01812 Encounter for preprocedural laboratory examination: Secondary | ICD-10-CM | POA: Diagnosis present

## 2020-04-22 LAB — SARS CORONAVIRUS 2 (TAT 6-24 HRS): SARS Coronavirus 2: NEGATIVE

## 2020-04-24 NOTE — Anesthesia Preprocedure Evaluation (Addendum)
Anesthesia Evaluation  Patient identified by MRN, date of birth, ID band Patient awake    Reviewed: Allergy & Precautions, NPO status , Patient's Chart, lab work & pertinent test results  History of Anesthesia Complications Negative for: history of anesthetic complications  Airway Mallampati: I  TM Distance: >3 FB Neck ROM: Full    Dental  (+) Missing, Dental Advisory Given   Pulmonary neg pulmonary ROS,  04/22/2020 SARS coronavirus NEG   breath sounds clear to auscultation       Cardiovascular hypertension, Pt. on medications (-) angina Rhythm:Regular Rate:Normal  '19 ECHO: Severe LVH with EF 65-70%, mild-mod MR   Neuro/Psych  Headaches, Depression    GI/Hepatic Neg liver ROS, GERD  Medicated and Controlled,  Endo/Other  lupus  Renal/GU ESRF and DialysisRenal disease (K+ 4.1)     Musculoskeletal   Abdominal   Peds  Hematology negative hematology ROS (+)   Anesthesia Other Findings   Reproductive/Obstetrics                            Anesthesia Physical Anesthesia Plan  ASA: III  Anesthesia Plan: General   Post-op Pain Management:    Induction: Intravenous  PONV Risk Score and Plan: 3 and Ondansetron, Dexamethasone and Scopolamine patch - Pre-op  Airway Management Planned: LMA  Additional Equipment:   Intra-op Plan:   Post-operative Plan:   Informed Consent: I have reviewed the patients History and Physical, chart, labs and discussed the procedure including the risks, benefits and alternatives for the proposed anesthesia with the patient or authorized representative who has indicated his/her understanding and acceptance.     Dental advisory given  Plan Discussed with: CRNA and Surgeon  Anesthesia Plan Comments:        Anesthesia Quick Evaluation

## 2020-04-25 ENCOUNTER — Ambulatory Visit (HOSPITAL_COMMUNITY): Payer: Medicare Other

## 2020-04-25 ENCOUNTER — Encounter (HOSPITAL_COMMUNITY)
Admission: RE | Disposition: A | Discharge status: Home / Self Care | Payer: Self-pay | Source: Home / Self Care | Attending: Vascular Surgery

## 2020-04-25 ENCOUNTER — Ambulatory Visit (HOSPITAL_COMMUNITY): Payer: Medicare Other | Admitting: Anesthesiology

## 2020-04-25 ENCOUNTER — Encounter (HOSPITAL_COMMUNITY): Payer: Self-pay | Admitting: Vascular Surgery

## 2020-04-25 ENCOUNTER — Other Ambulatory Visit: Payer: Self-pay

## 2020-04-25 ENCOUNTER — Ambulatory Visit (HOSPITAL_COMMUNITY)
Admission: RE | Admit: 2020-04-25 | Discharge: 2020-04-25 | Disposition: A | Discharge status: Home / Self Care | Payer: Medicare Other | Attending: Vascular Surgery | Admitting: Vascular Surgery

## 2020-04-25 DIAGNOSIS — N186 End stage renal disease: Secondary | ICD-10-CM | POA: Insufficient documentation

## 2020-04-25 DIAGNOSIS — Z992 Dependence on renal dialysis: Secondary | ICD-10-CM | POA: Diagnosis not present

## 2020-04-25 DIAGNOSIS — Z79899 Other long term (current) drug therapy: Secondary | ICD-10-CM | POA: Diagnosis not present

## 2020-04-25 DIAGNOSIS — M3214 Glomerular disease in systemic lupus erythematosus: Secondary | ICD-10-CM | POA: Diagnosis not present

## 2020-04-25 DIAGNOSIS — E785 Hyperlipidemia, unspecified: Secondary | ICD-10-CM | POA: Diagnosis not present

## 2020-04-25 DIAGNOSIS — Z888 Allergy status to other drugs, medicaments and biological substances status: Secondary | ICD-10-CM | POA: Diagnosis not present

## 2020-04-25 DIAGNOSIS — Z9889 Other specified postprocedural states: Secondary | ICD-10-CM

## 2020-04-25 DIAGNOSIS — N2581 Secondary hyperparathyroidism of renal origin: Secondary | ICD-10-CM | POA: Insufficient documentation

## 2020-04-25 DIAGNOSIS — Z882 Allergy status to sulfonamides status: Secondary | ICD-10-CM | POA: Insufficient documentation

## 2020-04-25 DIAGNOSIS — I120 Hypertensive chronic kidney disease with stage 5 chronic kidney disease or end stage renal disease: Secondary | ICD-10-CM | POA: Insufficient documentation

## 2020-04-25 DIAGNOSIS — K219 Gastro-esophageal reflux disease without esophagitis: Secondary | ICD-10-CM | POA: Insufficient documentation

## 2020-04-25 DIAGNOSIS — N185 Chronic kidney disease, stage 5: Secondary | ICD-10-CM

## 2020-04-25 HISTORY — PX: AV FISTULA PLACEMENT: SHX1204

## 2020-04-25 LAB — POCT I-STAT, CHEM 8
BUN: 41 mg/dL — ABNORMAL HIGH (ref 6–20)
Calcium, Ion: 1.04 mmol/L — ABNORMAL LOW (ref 1.15–1.40)
Chloride: 100 mmol/L (ref 98–111)
Creatinine, Ser: 8.4 mg/dL — ABNORMAL HIGH (ref 0.44–1.00)
Glucose, Bld: 142 mg/dL — ABNORMAL HIGH (ref 70–99)
HCT: 32 % — ABNORMAL LOW (ref 36.0–46.0)
Hemoglobin: 10.9 g/dL — ABNORMAL LOW (ref 12.0–15.0)
Potassium: 4.1 mmol/L (ref 3.5–5.1)
Sodium: 136 mmol/L (ref 135–145)
TCO2: 24 mmol/L (ref 22–32)

## 2020-04-25 LAB — HCG, SERUM, QUALITATIVE: Preg, Serum: NEGATIVE

## 2020-04-25 SURGERY — ARTERIOVENOUS (AV) FISTULA CREATION
Anesthesia: General | Site: Arm Upper | Laterality: Right

## 2020-04-25 MED ORDER — LIDOCAINE HCL 1 % IJ SOLN
INTRAMUSCULAR | Status: DC | PRN
  Administered 2020-04-25: 30 mL via INTRADERMAL

## 2020-04-25 MED ORDER — MIDAZOLAM HCL 2 MG/2ML IJ SOLN
INTRAMUSCULAR | Status: AC
  Filled 2020-04-25: qty 2

## 2020-04-25 MED ORDER — MIDAZOLAM HCL 2 MG/2ML IJ SOLN: 0.5000 mg | Freq: Once | INTRAMUSCULAR | Status: DC | PRN

## 2020-04-25 MED ORDER — OXYCODONE HCL 5 MG/5ML PO SOLN: 5.0000 mg | Freq: Once | ORAL | Status: AC | PRN

## 2020-04-25 MED ORDER — FENTANYL CITRATE (PF) 100 MCG/2ML IJ SOLN
25.0000 ug | INTRAMUSCULAR | Status: DC | PRN
  Administered 2020-04-25: 50 ug via INTRAVENOUS

## 2020-04-25 MED ORDER — SODIUM CHLORIDE 0.9 % IV SOLN
INTRAVENOUS | Status: DC | PRN
  Administered 2020-04-25: 500 mL

## 2020-04-25 MED ORDER — HYDROCODONE-ACETAMINOPHEN 5-325 MG PO TABS: 1.0000 | ORAL_TABLET | ORAL | 0 refills | Status: DC | PRN

## 2020-04-25 MED ORDER — SCOPOLAMINE 1 MG/3DAYS TD PT72
MEDICATED_PATCH | TRANSDERMAL | Status: AC
  Filled 2020-04-25: qty 1

## 2020-04-25 MED ORDER — CHLORHEXIDINE GLUCONATE 4 % EX LIQD: 60.0000 mL | Freq: Once | CUTANEOUS | Status: DC

## 2020-04-25 MED ORDER — MIDAZOLAM HCL 2 MG/2ML IJ SOLN
INTRAMUSCULAR | Status: DC | PRN
  Administered 2020-04-25: 2 mg via INTRAVENOUS

## 2020-04-25 MED ORDER — SCOPOLAMINE 1 MG/3DAYS TD PT72
MEDICATED_PATCH | TRANSDERMAL | Status: DC | PRN
  Administered 2020-04-25: 1 via TRANSDERMAL

## 2020-04-25 MED ORDER — CEFAZOLIN SODIUM-DEXTROSE 2-4 GM/100ML-% IV SOLN
2.0000 g | INTRAVENOUS | Status: AC
  Administered 2020-04-25: 2 g via INTRAVENOUS
  Filled 2020-04-25: qty 100

## 2020-04-25 MED ORDER — ORAL CARE MOUTH RINSE: 15.0000 mL | Freq: Once | OROMUCOSAL | Status: AC

## 2020-04-25 MED ORDER — HEMOSTATIC AGENTS (NO CHARGE) OPTIME
TOPICAL | Status: DC | PRN
  Administered 2020-04-25: 1 via TOPICAL

## 2020-04-25 MED ORDER — 0.9 % SODIUM CHLORIDE (POUR BTL) OPTIME
TOPICAL | Status: DC | PRN
  Administered 2020-04-25: 1000 mL

## 2020-04-25 MED ORDER — MEPERIDINE HCL 25 MG/ML IJ SOLN: 6.2500 mg | INTRAMUSCULAR | Status: DC | PRN

## 2020-04-25 MED ORDER — SODIUM CHLORIDE 0.9 % IV SOLN: INTRAVENOUS | Status: DC

## 2020-04-25 MED ORDER — FENTANYL CITRATE (PF) 100 MCG/2ML IJ SOLN
INTRAMUSCULAR | Status: AC
  Filled 2020-04-25: qty 2

## 2020-04-25 MED ORDER — PHENYLEPHRINE HCL-NACL 10-0.9 MG/250ML-% IV SOLN
INTRAVENOUS | Status: DC | PRN
  Administered 2020-04-25: 25 ug/min via INTRAVENOUS

## 2020-04-25 MED ORDER — CHLORHEXIDINE GLUCONATE 0.12 % MT SOLN
OROMUCOSAL | Status: AC
  Administered 2020-04-25: 15 mL via OROMUCOSAL
  Filled 2020-04-25: qty 15

## 2020-04-25 MED ORDER — ONDANSETRON HCL 4 MG/2ML IJ SOLN
INTRAMUSCULAR | Status: DC | PRN
  Administered 2020-04-25: 4 mg via INTRAVENOUS

## 2020-04-25 MED ORDER — PROMETHAZINE HCL 25 MG/ML IJ SOLN: 6.2500 mg | INTRAMUSCULAR | Status: DC | PRN

## 2020-04-25 MED ORDER — FENTANYL CITRATE (PF) 250 MCG/5ML IJ SOLN
INTRAMUSCULAR | Status: AC
  Filled 2020-04-25: qty 5

## 2020-04-25 MED ORDER — HEPARIN SODIUM (PORCINE) 1000 UNIT/ML IJ SOLN
1.6000 mL | Freq: Once | INTRAMUSCULAR | Status: AC
  Administered 2020-04-25: 1600 [IU]
  Filled 2020-04-25: qty 1.6

## 2020-04-25 MED ORDER — CHLORHEXIDINE GLUCONATE 0.12 % MT SOLN: 15.0000 mL | Freq: Once | OROMUCOSAL | Status: AC

## 2020-04-25 MED ORDER — LIDOCAINE HCL (PF) 1 % IJ SOLN
INTRAMUSCULAR | Status: AC
  Filled 2020-04-25: qty 30

## 2020-04-25 MED ORDER — SODIUM CHLORIDE 0.9 % IV SOLN
INTRAVENOUS | Status: AC
  Filled 2020-04-25: qty 1.2

## 2020-04-25 MED ORDER — FENTANYL CITRATE (PF) 250 MCG/5ML IJ SOLN
INTRAMUSCULAR | Status: DC | PRN
  Administered 2020-04-25 (×4): 25 ug via INTRAVENOUS

## 2020-04-25 MED ORDER — PROPOFOL 10 MG/ML IV BOLUS
INTRAVENOUS | Status: DC | PRN
  Administered 2020-04-25: 150 mg via INTRAVENOUS

## 2020-04-25 MED ORDER — OXYCODONE HCL 5 MG PO TABS
ORAL_TABLET | ORAL | Status: AC
  Filled 2020-04-25: qty 1

## 2020-04-25 MED ORDER — OXYCODONE HCL 5 MG PO TABS
5.0000 mg | ORAL_TABLET | Freq: Once | ORAL | Status: AC | PRN
  Administered 2020-04-25: 5 mg via ORAL

## 2020-04-25 MED ORDER — HEPARIN SODIUM (PORCINE) 1000 UNIT/ML IJ SOLN
INTRAMUSCULAR | Status: DC | PRN
  Administered 2020-04-25: 3000 [IU] via INTRAVENOUS

## 2020-04-25 MED ORDER — DEXAMETHASONE SODIUM PHOSPHATE 10 MG/ML IJ SOLN
INTRAMUSCULAR | Status: DC | PRN
  Administered 2020-04-25: 5 mg via INTRAVENOUS

## 2020-04-25 SURGICAL SUPPLY — 41 items
ADH SKN CLS APL DERMABOND .7 (GAUZE/BANDAGES/DRESSINGS) ×1
AGENT HMST SPONGE THK3/8 (HEMOSTASIS)
ARMBAND PINK RESTRICT EXTREMIT (MISCELLANEOUS) ×4 IMPLANT
BLADE CLIPPER SURG (BLADE) ×3 IMPLANT
CANISTER SUCT 3000ML PPV (MISCELLANEOUS) ×3 IMPLANT
CLIP VESOCCLUDE MED 6/CT (CLIP) ×3 IMPLANT
CLIP VESOCCLUDE SM WIDE 6/CT (CLIP) ×3 IMPLANT
COVER PROBE W GEL 5X96 (DRAPES) ×3 IMPLANT
COVER WAND RF STERILE (DRAPES) ×1 IMPLANT
DECANTER SPIKE VIAL GLASS SM (MISCELLANEOUS) ×3 IMPLANT
DERMABOND ADVANCED (GAUZE/BANDAGES/DRESSINGS) ×2
DERMABOND ADVANCED .7 DNX12 (GAUZE/BANDAGES/DRESSINGS) ×1 IMPLANT
ELECT REM PT RETURN 9FT ADLT (ELECTROSURGICAL) ×3
ELECTRODE REM PT RTRN 9FT ADLT (ELECTROSURGICAL) ×1 IMPLANT
GLOVE BIO SURGEON STRL SZ 6.5 (GLOVE) ×1 IMPLANT
GLOVE BIO SURGEON STRL SZ7.5 (GLOVE) ×3 IMPLANT
GLOVE BIO SURGEONS STRL SZ 6.5 (GLOVE) ×1
GLOVE BIOGEL PI IND STRL 7.0 (GLOVE) IMPLANT
GLOVE BIOGEL PI IND STRL 8 (GLOVE) ×1 IMPLANT
GLOVE BIOGEL PI INDICATOR 7.0 (GLOVE) ×2
GLOVE BIOGEL PI INDICATOR 8 (GLOVE) ×2
GOWN STRL REUS W/ TWL LRG LVL3 (GOWN DISPOSABLE) ×2 IMPLANT
GOWN STRL REUS W/ TWL XL LVL3 (GOWN DISPOSABLE) ×2 IMPLANT
GOWN STRL REUS W/TWL LRG LVL3 (GOWN DISPOSABLE) ×6
GOWN STRL REUS W/TWL XL LVL3 (GOWN DISPOSABLE) ×6
HEMOSTAT SNOW SURGICEL 2X4 (HEMOSTASIS) ×2 IMPLANT
HEMOSTAT SPONGE AVITENE ULTRA (HEMOSTASIS) IMPLANT
KIT BASIN OR (CUSTOM PROCEDURE TRAY) ×3 IMPLANT
KIT TURNOVER KIT B (KITS) ×3 IMPLANT
LOOP VESSEL MINI RED (MISCELLANEOUS) ×2 IMPLANT
NS IRRIG 1000ML POUR BTL (IV SOLUTION) ×3 IMPLANT
PACK CV ACCESS (CUSTOM PROCEDURE TRAY) ×3 IMPLANT
PAD ARMBOARD 7.5X6 YLW CONV (MISCELLANEOUS) ×6 IMPLANT
SUT MNCRL AB 4-0 PS2 18 (SUTURE) ×3 IMPLANT
SUT PROLENE 6 0 BV (SUTURE) ×3 IMPLANT
SUT PROLENE 7 0 BV 1 (SUTURE) IMPLANT
SUT VIC AB 3-0 SH 27 (SUTURE) ×3
SUT VIC AB 3-0 SH 27X BRD (SUTURE) ×1 IMPLANT
TOWEL GREEN STERILE (TOWEL DISPOSABLE) ×3 IMPLANT
UNDERPAD 30X36 HEAVY ABSORB (UNDERPADS AND DIAPERS) ×3 IMPLANT
WATER STERILE IRR 1000ML POUR (IV SOLUTION) ×3 IMPLANT

## 2020-04-25 NOTE — Progress Notes (Signed)
50 mcg of Fentanyl wasted in stericycle with Jerene Bears, RN.

## 2020-04-25 NOTE — H&P (Signed)
History and Physical Interval Note:  04/25/2020 7:20 AM  [name]  has presented today for surgery, with the diagnosis of END STAGE RENAL DISEASE.  The various methods of treatment have been discussed with the patient and family. After consideration of risks, benefits and other options for treatment, the patient has consented to  Procedure(s): RIGHT UPPER EXTREMITY ARTERIOVENOUS (AV) GRAFT CREATION (Right) as a surgical intervention.  The patient's history has been reviewed, patient examined, no change in status, stable for surgery.  I have reviewed the patient's chart and labs.  Questions were answered to the patient's satisfaction.    Right upper extremity AV graft.  [name]  History of Present Illness: Patient is a 44 y.o. year old female who presents for placement hemodialysis access. She recently had attempted thrombectomy left forearm AV graft that failed. She had a TDC placed and scheduled for f/u vein mapping and plan for new access.      Past Medical History:  Diagnosis Date  . Allergy   . Anemia   . Arthritis   . Avascular necrosis of bone (HCC)    left tibial talus due to chronic prednisone use  . ESRD (end stage renal disease) on dialysis ([place])    tthsat [place]  . GERD (gastroesophageal reflux disease)   . Headache(784.0)   . Hyperlipemia   . Hypertension   . Lupus ([place])   . Secondary hyperparathyroidism (of renal origin)         Past Surgical History:  Procedure Laterality Date  . A/V FISTULAGRAM Left 12/02/2017   Procedure: A/V FISTULAGRAM - Left upper; Surgeon: Angelia Mould, MD; Location: [place] [facility]; Service: Cardiovascular; Laterality: Left;  . AV FISTULA PLACEMENT Left   . AV FISTULA PLACEMENT Right 01/26/2014   Procedure: ARTERIOVENOUS (AV) FISTULA CREATION; ultrasound guided; Surgeon: Conrad [place], MD; Location: [place]; Service: Vascular; Laterality: Right;  . ESOPHAGOGASTRODUODENOSCOPY N/A 03/17/2016   Procedure:  ESOPHAGOGASTRODUODENOSCOPY (EGD); Surgeon: Irene Shipper, MD; Location[facility] ENDOSCOPY; Service: Endoscopy; Laterality: N/A;  . FISTULOGRAM Left 09/24/2019   Procedure: FISTULOGRAM LEFT ARM; Surgeon: Mary Heck, MD; Location: [place]; Service: Vascular; Laterality: Left;  . HEMATOMA EVACUATION Left 10/09/2013   Procedure: EVACUATION HEMATOMA; Surgeon: Angelia Mould, MD; Location: [place]; Service: Vascular; Laterality: Left;  . HEMATOMA EVACUATION Left 09/24/2019   Procedure: EVACUATION HEMATOMA LEFT ARM; Surgeon: Mary Heck, MD; Location: [place]; Service: Vascular; Laterality: Left;  . INCISION AND DRAINAGE ABSCESS     gluteal  . INSERTION OF DIALYSIS CATHETER N/A 10/09/2013   Procedure: INSERTION OF DIALYSIS CATHETER; ULTRASOUND GUIDED; Surgeon: Angelia Mould, MD; Location: [place]; Service: Vascular; Laterality: N/A;  . INSERTION OF DIALYSIS CATHETER Left 09/24/2019   Procedure: INSERTION OF TUNNELLED DIALYSIS CATHETER - PALINDROME 15FR X 28CM; Surgeon: Mary Heck, MD; Location: [place]; Service: Vascular; Laterality: Left;  . INSERTION OF DIALYSIS CATHETER Right 03/15/2020   Procedure: Insertion Of Dialysis Catheter; Surgeon: Angelia Mould, MD; Location: [place]; Service: Cardiovascular; Laterality: Right;  . PERIPHERAL VASCULAR BALLOON ANGIOPLASTY Left 12/02/2017   Procedure: PERIPHERAL VASCULAR BALLOON ANGIOPLASTY; Surgeon: Angelia Mould, MD; Location: [place] [facility]; Service: Cardiovascular; Laterality: Left;  . REVISION OF ARTERIOVENOUS GORETEX GRAFT Left 04/03/2019   Procedure: REVISION OF ARTERIOVENOUS GORETEX GRAFT PSEUDOANEURYSM; Surgeon: Angelia Mould, MD; Location: [place]; Service: Vascular; Laterality: Left;  . SHUNTOGRAM N/A 01/07/2014   Procedure: fistulogram with possibe venoplasty left upper arm avg; Surgeon: Angelia Mould,  MD; Location: [place] CATH LAB; Service: Cardiovascular; Laterality: N/A;  . THROMBECTOMY AND  REVISION OF ARTERIOVENTOUS (AV) GORETEX GRAFT Left 03/15/2020   Procedure: ATTEMPTED THROMBECTOMY OF LEFT ARM ARTERIOVENTOUS (AV) GORETEX GRAFT; Surgeon: Angelia Mould, MD; Location: [place]; Service: Cardiovascular; Laterality: Left;  . tibiocalcaneal fusion left Left   . ULTRASOUND GUIDANCE FOR VASCULAR ACCESS  09/24/2019   Procedure: Ultrasound Guidance For Vascular Access; Surgeon: Mary Heck, MD; Location: [facility] OR; Service: Vascular;;   Social History  Social History       Tobacco Use  . Smoking status: Never Smoker  . Smokeless tobacco: Never Used  Substance Use Topics  . Alcohol use: No  . Drug use: No   Family History       Family History  Problem Relation Age of Onset  . Asthma Sister   . Hypertension Mother   . Heart attack Mother 28   died at 2  . COPD Maternal Uncle    prostate  . Mental illness Neg Hx    Allergies       Allergies  Allergen Reactions  . Amlodipine Hives    Swelling  . Sulfa Antibiotics Hives and Itching  . Vancomycin Hives and Itching  . Venlafaxine Other (See Comments)    Emotional         Current Outpatient Medications  Medication Sig Dispense Refill  . calcium acetate, Phos Binder, (PHOSLYRA) 667 MG/5ML SOLN Take 667 mg by mouth 4 (four) times daily - with meals and at bedtime.     Marland Kitchen diltiazem (TIAZAC) 240 MG 24 hr capsule Take 240 mg by mouth at bedtime.     . hydrALAZINE (APRESOLINE) 25 MG tablet TAKE 1 TABLET BY MOUTH TWICE DAILY (Patient taking differently: Take 25 mg by mouth daily. ) 180 tablet 1  . lanthanum (FOSRENOL) 1000 MG chewable tablet Chew 1 tablet by mouth See admin instructions. 1 tablet with meals and 1/2 tablet with snacks    . lisinopril (ZESTRIL) 40 MG tablet Take 40 mg by mouth daily.    . multivitamin (RENA-VIT) TABS tablet Take 1 tablet by mouth daily. 30 tablet 0  . oxyCODONE-acetaminophen (PERCOCET/ROXICET) 5-325 MG tablet Take 1 tablet by mouth every 6 (six) hours as needed. 10 tablet 0  .  pantoprazole (PROTONIX) 40 MG tablet Take 1 tablet (40 mg total) by mouth daily. 30 tablet 3  . PARoxetine (PAXIL) 30 MG tablet Take 30 mg by mouth daily.    . predniSONE (DELTASONE) 10 MG tablet Take 1 tablet (10 mg total) by mouth daily with breakfast. 90 tablet 3  . sevelamer carbonate (RENVELA) 800 MG tablet Take 800 mg by mouth in the morning and at bedtime.     . traZODone (DESYREL) 50 MG tablet Take 0.5-1 tablets (25-50 mg total) by mouth at bedtime. (Patient taking differently: Take 50 mg by mouth at bedtime. ) 90 tablet 3   No current facility-administered medications for this visit.   ROS:  General: No weight loss, Fever, chills  HEENT: No recent headaches, no nasal bleeding, no visual changes, no sore throat  Neurologic: No dizziness, blackouts, seizures. No recent symptoms of stroke or mini- stroke. No recent episodes of slurred speech, or temporary blindness.  Cardiac: No recent episodes of chest pain/pressure, no shortness of breath at rest. No shortness of breath with exertion. Denies history of atrial fibrillation or irregular heartbeat  Vascular: No history of rest pain in feet. No history of claudication. No history of non-healing ulcer, No  history of DVT  Pulmonary: No home oxygen, no productive cough, no hemoptysis, No asthma or wheezing  Musculoskeletal: [ ]  Arthritis, [ ]  Low back pain, [ ]  Joint pain  Hematologic:No history of hypercoagulable state. No history of easy bleeding. No history of anemia  Gastrointestinal: No hematochezia or melena, No gastroesophageal reflux, no trouble swallowing  Urinary: [ ]  chronic Kidney disease, [x ] on HD - [ ]  MWF or [x ] TTHS, [ ]  Burning with urination, [ ]  Frequent urination, [ ]  Difficulty urinating;  Skin: No rashes  Psychological: No history of anxiety, No history of depression  Physical Examination     Vitals:   04/13/20 1138  BP: (!) 151/100  Pulse: 67  Resp: 16  Temp: (!) 97 F (36.1 C)  TempSrc: Oral  SpO2: 100%   Weight: 134 lb (60.8 kg)  Height: 5\' 4"  (1.626 m)   Body mass index is 23 kg/m.  General: Alert and oriented, no acute distress  HEENT: Normal  Neck: No bruit or JVD  Pulmonary: Clear to auscultation bilaterally  Cardiac: Regular Rate and Rhythm without murmur  Gastrointestinal: Soft, non-tender, non-distended, no mass, no scars  Skin: No rash  Extremity Pulses: 2+ radial, brachial pulses bilaterally  Musculoskeletal: left UE edema, healing incision  Neurologic: Upper and lower extremity motor 5/5 and symmetric  DATA:  She does not have acceptable veins on vein mapping in the right UE, she has good arterial flow.  ASSESSMENT:  ESRD  PLAN:  She will be scheduled right UE AV graft placement. She has HD on TTS. She agrees with this plan.  Roxy Horseman  PA-C  [facility] [place]  Office: [phone]  MD in clinic [place]

## 2020-04-25 NOTE — Anesthesia Procedure Notes (Signed)
Procedure Name: LMA Insertion Date/Time: 04/25/2020 7:45 AM Performed by: Bryson Corona, CRNA Pre-anesthesia Checklist: Patient identified, Emergency Drugs available, Suction available and Patient being monitored Patient Re-evaluated:Patient Re-evaluated prior to induction Oxygen Delivery Method: Circle System Utilized Preoxygenation: Pre-oxygenation with 100% oxygen Induction Type: IV induction Ventilation: Mask ventilation without difficulty LMA: LMA inserted LMA Size: 4.0 Number of attempts: 1 Airway Equipment and Method: Bite block Placement Confirmation: positive ETCO2 Tube secured with: Tape Dental Injury: Teeth and Oropharynx as per pre-operative assessment

## 2020-04-25 NOTE — Progress Notes (Signed)
Dr. Carlis Abbott PA at beside about 12:45 and said she was ok for patient to go home as long as patient knew what to look for in arm and when to call doctors office. Pt. Feels good about going home.

## 2020-04-25 NOTE — Op Note (Signed)
OPERATIVE NOTE   PROCEDURE: right first stage basilic vein transposition (brachiobasilic arteriovenous fistula) placement  PRE-OPERATIVE DIAGNOSIS: ESRD  POST-OPERATIVE DIAGNOSIS: same  SURGEON: [name], MD  ASSISTANT(S): Paulo Fruit, PA  ANESTHESIA: LMA  ESTIMATED BLOOD LOSS: Minimal  FINDING(S): 1.  Basilic vein: 3 mm, acceptable 2.  Brachial artery: 2 mm, atherosclerotic disease evident (high brachial bifurcation at axilla, small artery at antecubital fossa) 3.  Venous outflow: palpable thrill  4.  Radial flow: dopplerable radial signal  SPECIMEN(S):  none  INDICATIONS:   [name] is a 44 y.o. female who presents with end-stage renal disease and the need for permanent dialysis access.  Ultimately she has a failed brachiobrachial fistula in the right arm placed by Dr. Bridgett Larsson.  She also had a left upper extremity loop graft that has failed.  She presents for planned right arm access.  We had previously discussed AV graft given vein mapping.  However when I evaluated her basilic vein with ultrasound in the OR this was greater than 3 mm at the antecubitum and elected to use this for first stage brachiobasilic fistula.  The artery was small given high bifurcation by the axilla and I sewed to the larger artery.  The patient is aware the risks include but are not limited to: bleeding, infection, steal syndrome, nerve damage, ischemic monomelic neuropathy, failure to mature, and need for additional procedures.  The patient is aware of the risks of the procedure and elects to proceed forward.   DESCRIPTION: After full informed written consent was obtained from the patient, the patient was brought back to the operating room and placed supine upon the operating table.  Prior to induction, the patient received IV antibiotics.   After obtaining adequate anesthesia, the patient was then prepped and draped in the standard fashion for a right arm access procedure.  I  turned my attention first to evaluating the patients surface veins and she had a usable basilic vein in the right arm even though her vein mapping suggested this was small.  Using SonoSite guidance, the location of these vessels were marked out on the skin.   At this point, I injected local anesthetic to obtain a field block of the antecubitum.  In total, I injected about 5 mL of 1% lidocaine without epinephrine.  I made a trasverse incision at the level of the antecubitum and dissected through the subcutaneous tissue and fascia to gain exposure of the brachial artery.  This was noted to be 2-2.5 mm in diameter externally given high bifurcation by the axilla.  I dissected out the larger arterial branch where she previously had a brachiobasilic fistula by Dr. Bridgett Larsson.  This was dissected out proximally and distally and controlled with vessel loops .  I then dissected out the basilic vein.  This was noted to be 3 mm in diameter externally.  The distal segment of the vein was ligated with a  2-0 silk, and the vein was transected.  The proximal segment was interrogated with serial dilators.  The vein accepted up to a 4 mm dilator without any difficulty.  I then instilled the heparinized saline into the vein and clamped it.  At this point, I reset my exposure of the brachial artery.  The patient was given 3000 units IV heparin.  I then placed the artery under tension proximally and distally.  I made an arteriotomy with a #11 blade, and then I extended the arteriotomy with a Potts scissor.  Ultimately I  passed dilators in each direction up to 2.5 mm dilator with good pulsatile inflow and backbleeding from the artery.  I injected heparinized saline proximal and distal to this arteriotomy.  The vein was then sewn to the artery in an end-to-side configuration with a running stitch of 6-0 Prolene.  Prior to completing this anastomosis, I allowed the vein and artery to backbleed.  There was no evidence of clot from any vessels.   I completed the anastomosis in the usual fashion and then released all vessel loops and clamps.   There was a palpable thrill in the venous outflow, and there was a dopplerable radial signal.  At this point, I irrigated out the surgical wound.  There was no further active bleeding.  The subcutaneous tissue was reapproximated with a running stitch of 3-0 Vicryl.  The skin was then reapproximated with a running subcuticular stitch of 4-0 Monocryl.  The skin was then cleaned, dried, and reinforced with Dermabond.  The patient tolerated this procedure well.   COMPLICATIONS: None  CONDITION: Stable   [name] MD [facility] [place] Office: [phone]  04/25/2020, 9:12 AM

## 2020-04-25 NOTE — Anesthesia Postprocedure Evaluation (Signed)
Anesthesia Post Note  Patient: [name]  Procedure(s) Performed: RIGHT UPPER ARM  BASILIC VEIN ARTERIOVENOUS (AV) FISTULA (Right Arm Upper)     Patient location during evaluation: PACU Anesthesia Type: General Level of consciousness: awake and alert, oriented and patient cooperative Pain management: pain level controlled Vital Signs Assessment: post-procedure vital signs reviewed and stable Respiratory status: spontaneous breathing, nonlabored ventilation and respiratory function stable Cardiovascular status: blood pressure returned to baseline and stable Postop Assessment: no apparent nausea or vomiting Anesthetic complications: no    Last Vitals:  Vitals:   04/25/20 1206 04/25/20 1236  BP: 123/86 136/88  Pulse: 73 70  Resp: (!) 7 14  Temp:  (!) 36.3 C  SpO2: 98% 95%    Last Pain:  Vitals:   04/25/20 1236  PainSc: 0-No pain                 Ziyonna Christner,E. Amour Trigg

## 2020-04-25 NOTE — Discharge Instructions (Signed)
[facility]  Discharge Instructions  AV Fistula or Graft Surgery for Dialysis Access  Please refer to the following instructions for your post-procedure care. Your surgeon or physician assistant will discuss any changes with you.  Activity  You may drive the day following your surgery, if you are comfortable and no longer taking prescription pain medication. Resume full activity as the soreness in your incision resolves.  Bathing/Showering  You may shower after you go home. Keep your incision dry for 48 hours. Do not soak in a bathtub, hot tub, or swim until the incision heals completely. You may not shower if you have a hemodialysis catheter.  Incision Care  Clean your incision with mild soap and water after 48 hours. Pat the area dry with a clean towel. You do not need a bandage unless otherwise instructed. Do not apply any ointments or creams to your incision. You may have skin glue on your incision. Do not peel it off. It will come off on its own in about one week. Your arm may swell a bit after surgery. To reduce swelling use pillows to elevate your arm so it is above your heart. Your doctor will tell you if you need to lightly wrap your arm with an ACE bandage.  Diet  Resume your normal diet. There are not special food restrictions following this procedure. In order to heal from your surgery, it is CRITICAL to get adequate nutrition. Your body requires vitamins, minerals, and protein. Vegetables are the best source of vitamins and minerals. Vegetables also provide the perfect balance of protein. Processed food has little nutritional value, so try to avoid this.  Medications  Resume taking all of your medications. If your incision is causing pain, you may take over-the counter pain relievers such as acetaminophen (Tylenol). If you were prescribed a stronger pain medication, please be aware these medications can cause nausea and constipation. Prevent  nausea by taking the medication with a snack or meal. Avoid constipation by drinking plenty of fluids and eating foods with high amount of fiber, such as fruits, vegetables, and grains.  Do not take Tylenol if you are taking prescription pain medications.  Follow up Your surgeon may want to see you in the office following your access surgery. If so, this will be arranged at the time of your surgery.  Please call us immediately for any of the following conditions:  . Increased pain, redness, drainage (pus) from your incision site . Fever of 101 degrees or higher . Severe or worsening pain at your incision site . Hand pain or numbness. .  Reduce your risk of vascular disease:  . Stop smoking. If you would like help, call [facility] at 1-800-QUIT-NOW (254)405-6457) or [place] at 670-485-1046  . Manage your cholesterol . Maintain a desired weight . Control your diabetes . Keep your blood pressure down  Dialysis  It will take several weeks to several months for your new dialysis access to be ready for use. Your surgeon will determine when it is okay to use it. Your nephrologist will continue to direct your dialysis. You can continue to use your Permcath until your new access is ready for use.   04/25/2020 [name] [id number] 11-01-1976  Surgeon(s): [name], MD  Procedure(s): RIGHT UPPER ARM  BASILIC VEIN ARTERIOVENOUS (AV) FISTULA   May stick graft immediately   May stick graft on designated area only:   X Do not stick right AV fistula for 12  weeks    If you have any questions, please call the office at [phone].

## 2020-04-25 NOTE — Progress Notes (Signed)
Pt. Having swelling at fistula site around 1045, MD made aware and came to see patient.  Bruit and thrill present, +2 radial pulse, and no numbness or tingling in hand throughout PACU stay. Dr. Carlis Abbott held pressure and wrapped arm in ACE wrap. Wants patient to stay at least an hour for someone from his team to check again before discharge. All other vitals are stable and patient is comfortable. Iona Beard, her ride, has been updated. Will continue to monitor closely.

## 2020-04-25 NOTE — Transfer of Care (Signed)
Immediate Anesthesia Transfer of Care Note  Patient: [name]  Procedure(s) Performed: RIGHT UPPER ARM  BASILIC VEIN ARTERIOVENOUS (AV) FISTULA (Right Arm Upper)  Patient Location: PACU  Anesthesia Type:General  Level of Consciousness: drowsy  Airway & Oxygen Therapy: Patient Spontanous Breathing and Patient connected to nasal cannula oxygen  Post-op Assessment: Report given to RN and Post -op Vital signs reviewed and stable  Post vital signs: Reviewed and stable  Last Vitals:  Vitals Value Taken Time  BP 156/107 04/25/20 0935  Temp    Pulse 101 04/25/20 0935  Resp 23 04/25/20 0935  SpO2 100 % 04/25/20 0935  Vitals shown include unvalidated device data.  Last Pain:  Vitals:   04/25/20 0626  PainSc: 0-No pain         Complications: No apparent anesthesia complications

## 2020-05-19 ENCOUNTER — Other Ambulatory Visit: Payer: Self-pay | Admitting: *Deleted

## 2020-05-19 DIAGNOSIS — N186 End stage renal disease: Secondary | ICD-10-CM

## 2020-05-31 ENCOUNTER — Ambulatory Visit (HOSPITAL_COMMUNITY): Payer: Medicare Other

## 2020-06-01 ENCOUNTER — Other Ambulatory Visit: Payer: Self-pay

## 2020-06-01 ENCOUNTER — Ambulatory Visit (HOSPITAL_COMMUNITY)
Admission: RE | Admit: 2020-06-01 | Discharge: 2020-06-01 | Disposition: A | Discharge status: Home / Self Care | Payer: Medicare Other | Source: Ambulatory Visit | Attending: Vascular Surgery | Admitting: Vascular Surgery

## 2020-06-01 ENCOUNTER — Ambulatory Visit (INDEPENDENT_AMBULATORY_CARE_PROVIDER_SITE_OTHER): Payer: Self-pay | Admitting: Physician Assistant

## 2020-06-01 VITALS — BP 148/102 | HR 102 | Temp 97.4°F | Resp 20 | Ht 64.0 in | Wt 133.8 lb

## 2020-06-01 DIAGNOSIS — N186 End stage renal disease: Secondary | ICD-10-CM

## 2020-06-01 DIAGNOSIS — Z992 Dependence on renal dialysis: Secondary | ICD-10-CM | POA: Diagnosis present

## 2020-06-01 NOTE — Progress Notes (Signed)
    Postoperative Access Visit   History of Present Illness   [name] is a 44 y.o. year old female who presents for postoperative follow-up for: right first stage basilic vein transposition on 04/25/20 by Dr. Carlis Abbott. The patient's wound is well healed.  The patient notes minimal steal symptoms. She does have numbness of her right 4th finger, it has improved some since her surgery. The patient is able to complete their activities of daily living.  She previously had undergone an attempted thrombectomy of her left forearm AV graft but it unfortunately failed. She has a right IJ TDC. She has been using the [facility] for HD on TTS  Physical Examination   Vitals:   06/01/20 0836  BP: (!) 148/102  Pulse: (!) 102  Resp: 20  Temp: (!) 97.4 F (36.3 C)  TempSrc: Temporal  SpO2: 97%  Weight: 133 lb 12.8 oz (60.7 kg)  Height: 5\' 4"  (1.626 m)   Body mass index is 22.97 kg/m.  right arm AC Incision is well healed, 2+ radial pulse, hand grip is 5/5, sensation in digits is  intact, palpable thrill, bruit can be auscultated     Medical Decision Making    [name] is a 44 y.o. year old female who presents s/p right first stage basilic vein transposition on 04/25/20 by Dr. Carlis Abbott. Her incision is well healed. Her duplex today shows that her vein is well matured but is too deep in the right upper extremity. She is currently without any steal symptoms. She is currently dialyzing TTS via right IJ TDC  I discussed with patient that she needs a second stage transposition of her fistula due to the depth of the fistula in her right upper extremity The patient is aware that the risks include but are not limited to: bleeding, infection, steal syndrome, nerve damage, thrombosis, failure to mature, and need for additional procedures.   The patient agrees to proceed with the procedure She is not on any blood thinners that need to be held for surgery I will schedule her for a second stage BV  transposition with Dr. Carlis Abbott on a non dialysis day   Karoline Caldwell, PA-C [facility] [place]: [phone]  Clinic MD: Oneida Alar

## 2020-06-06 ENCOUNTER — Other Ambulatory Visit: Payer: Self-pay

## 2020-06-06 NOTE — Progress Notes (Signed)
Pt scheduled for second stage BVT right upper extremity on 07/01/20. Reports Covid vaccine series completed on 01/28/20 and 02/25/20. Denies any Covid symptoms, close contact or international travel within 14 days.

## 2020-06-08 DIAGNOSIS — J160 Chlamydial pneumonia: Secondary | ICD-10-CM | POA: Insufficient documentation

## 2020-06-30 ENCOUNTER — Encounter (HOSPITAL_COMMUNITY): Payer: Self-pay | Admitting: Vascular Surgery

## 2020-06-30 ENCOUNTER — Other Ambulatory Visit: Payer: Self-pay

## 2020-06-30 ENCOUNTER — Other Ambulatory Visit (HOSPITAL_COMMUNITY)
Admission: RE | Admit: 2020-06-30 | Discharge: 2020-06-30 | Disposition: A | Discharge status: Home / Self Care | Payer: Medicare Other | Source: Ambulatory Visit | Attending: Vascular Surgery | Admitting: Vascular Surgery

## 2020-06-30 DIAGNOSIS — Z20822 Contact with and (suspected) exposure to covid-19: Secondary | ICD-10-CM | POA: Insufficient documentation

## 2020-06-30 DIAGNOSIS — Z01812 Encounter for preprocedural laboratory examination: Secondary | ICD-10-CM | POA: Insufficient documentation

## 2020-06-30 LAB — SARS CORONAVIRUS 2 (TAT 6-24 HRS): SARS Coronavirus 2: NEGATIVE

## 2020-06-30 NOTE — Progress Notes (Signed)
[name] denies chest pain or shortness of breath. Patient was tested for Covid today and has been in quarantine since tested.

## 2020-07-01 ENCOUNTER — Ambulatory Visit (HOSPITAL_COMMUNITY)
Admission: RE | Admit: 2020-07-01 | Discharge: 2020-07-01 | Disposition: A | Discharge status: Home / Self Care | Payer: Medicare Other | Attending: Vascular Surgery | Admitting: Vascular Surgery

## 2020-07-01 ENCOUNTER — Encounter (HOSPITAL_COMMUNITY)
Admission: RE | Disposition: A | Discharge status: Home / Self Care | Payer: Self-pay | Source: Home / Self Care | Attending: Vascular Surgery

## 2020-07-01 ENCOUNTER — Ambulatory Visit (HOSPITAL_COMMUNITY): Payer: Medicare Other | Admitting: Certified Registered Nurse Anesthetist

## 2020-07-01 ENCOUNTER — Encounter (HOSPITAL_COMMUNITY): Payer: Self-pay | Admitting: Vascular Surgery

## 2020-07-01 DIAGNOSIS — N186 End stage renal disease: Secondary | ICD-10-CM | POA: Diagnosis not present

## 2020-07-01 DIAGNOSIS — D649 Anemia, unspecified: Secondary | ICD-10-CM | POA: Diagnosis not present

## 2020-07-01 DIAGNOSIS — F419 Anxiety disorder, unspecified: Secondary | ICD-10-CM | POA: Diagnosis not present

## 2020-07-01 DIAGNOSIS — Z992 Dependence on renal dialysis: Secondary | ICD-10-CM | POA: Diagnosis not present

## 2020-07-01 DIAGNOSIS — K219 Gastro-esophageal reflux disease without esophagitis: Secondary | ICD-10-CM | POA: Diagnosis not present

## 2020-07-01 DIAGNOSIS — F329 Major depressive disorder, single episode, unspecified: Secondary | ICD-10-CM | POA: Insufficient documentation

## 2020-07-01 DIAGNOSIS — N2581 Secondary hyperparathyroidism of renal origin: Secondary | ICD-10-CM | POA: Insufficient documentation

## 2020-07-01 DIAGNOSIS — E785 Hyperlipidemia, unspecified: Secondary | ICD-10-CM | POA: Diagnosis not present

## 2020-07-01 DIAGNOSIS — N185 Chronic kidney disease, stage 5: Secondary | ICD-10-CM

## 2020-07-01 DIAGNOSIS — I120 Hypertensive chronic kidney disease with stage 5 chronic kidney disease or end stage renal disease: Secondary | ICD-10-CM | POA: Diagnosis not present

## 2020-07-01 HISTORY — DX: Anxiety disorder, unspecified: F41.9

## 2020-07-01 HISTORY — DX: Dyspnea, unspecified: R06.00

## 2020-07-01 HISTORY — DX: Pneumonia, unspecified organism: J18.9

## 2020-07-01 HISTORY — PX: BASCILIC VEIN TRANSPOSITION: SHX5742

## 2020-07-01 HISTORY — DX: Personal history of other diseases of the circulatory system: Z86.79

## 2020-07-01 HISTORY — DX: Depression, unspecified: F32.A

## 2020-07-01 LAB — POCT I-STAT, CHEM 8
BUN: 36 mg/dL — ABNORMAL HIGH (ref 6–20)
Calcium, Ion: 1.04 mmol/L — ABNORMAL LOW (ref 1.15–1.40)
Chloride: 98 mmol/L (ref 98–111)
Creatinine, Ser: 6 mg/dL — ABNORMAL HIGH (ref 0.44–1.00)
Glucose, Bld: 184 mg/dL — ABNORMAL HIGH (ref 70–99)
HCT: 37 % (ref 36.0–46.0)
Hemoglobin: 12.6 g/dL (ref 12.0–15.0)
Potassium: 4.1 mmol/L (ref 3.5–5.1)
Sodium: 136 mmol/L (ref 135–145)
TCO2: 28 mmol/L (ref 22–32)

## 2020-07-01 LAB — HCG, SERUM, QUALITATIVE: Preg, Serum: NEGATIVE

## 2020-07-01 SURGERY — TRANSPOSITION, VEIN, BASILIC
Anesthesia: General | Site: Arm Upper | Laterality: Right

## 2020-07-01 MED ORDER — DEXAMETHASONE SODIUM PHOSPHATE 10 MG/ML IJ SOLN
INTRAMUSCULAR | Status: DC | PRN
  Administered 2020-07-01: 5 mg via INTRAVENOUS

## 2020-07-01 MED ORDER — CHLORHEXIDINE GLUCONATE 4 % EX LIQD: 60.0000 mL | Freq: Once | CUTANEOUS | Status: DC

## 2020-07-01 MED ORDER — PROPOFOL 10 MG/ML IV BOLUS
INTRAVENOUS | Status: AC
  Filled 2020-07-01: qty 20

## 2020-07-01 MED ORDER — OXYCODONE HCL 5 MG PO TABS
5.0000 mg | ORAL_TABLET | Freq: Once | ORAL | Status: AC | PRN
  Administered 2020-07-01: 5 mg via ORAL

## 2020-07-01 MED ORDER — CEFAZOLIN SODIUM-DEXTROSE 2-4 GM/100ML-% IV SOLN
2.0000 g | INTRAVENOUS | Status: DC
  Filled 2020-07-01 (×2): qty 100

## 2020-07-01 MED ORDER — CHLORHEXIDINE GLUCONATE 0.12 % MT SOLN: 15.0000 mL | Freq: Once | OROMUCOSAL | Status: AC

## 2020-07-01 MED ORDER — CEFAZOLIN SODIUM-DEXTROSE 2-3 GM-%(50ML) IV SOLR
INTRAVENOUS | Status: DC | PRN
  Administered 2020-07-01: 2 g via INTRAVENOUS

## 2020-07-01 MED ORDER — FENTANYL CITRATE (PF) 250 MCG/5ML IJ SOLN
INTRAMUSCULAR | Status: DC | PRN
  Administered 2020-07-01 (×2): 25 ug via INTRAVENOUS
  Administered 2020-07-01: 50 ug via INTRAVENOUS
  Administered 2020-07-01 (×2): 25 ug via INTRAVENOUS

## 2020-07-01 MED ORDER — 0.9 % SODIUM CHLORIDE (POUR BTL) OPTIME
TOPICAL | Status: DC | PRN
  Administered 2020-07-01: 1000 mL

## 2020-07-01 MED ORDER — PHENYLEPHRINE HCL-NACL 10-0.9 MG/250ML-% IV SOLN
INTRAVENOUS | Status: DC | PRN
  Administered 2020-07-01: 15 ug/min via INTRAVENOUS

## 2020-07-01 MED ORDER — EPHEDRINE SULFATE 50 MG/ML IJ SOLN
INTRAMUSCULAR | Status: DC | PRN
  Administered 2020-07-01: 10 mg via INTRAVENOUS
  Administered 2020-07-01: 5 mg via INTRAVENOUS

## 2020-07-01 MED ORDER — ONDANSETRON HCL 4 MG/2ML IJ SOLN
INTRAMUSCULAR | Status: DC | PRN
  Administered 2020-07-01: 4 mg via INTRAVENOUS

## 2020-07-01 MED ORDER — LACTATED RINGERS IV SOLN
INTRAVENOUS | Status: DC
Start: 2020-07-01 — End: 2020-07-01

## 2020-07-01 MED ORDER — LIDOCAINE 2% (20 MG/ML) 5 ML SYRINGE
INTRAMUSCULAR | Status: DC | PRN
  Administered 2020-07-01: 60 mg via INTRAVENOUS

## 2020-07-01 MED ORDER — OXYCODONE HCL 5 MG PO TABS
ORAL_TABLET | ORAL | Status: AC
  Filled 2020-07-01: qty 1

## 2020-07-01 MED ORDER — FENTANYL CITRATE (PF) 250 MCG/5ML IJ SOLN
INTRAMUSCULAR | Status: AC
  Filled 2020-07-01: qty 5

## 2020-07-01 MED ORDER — ORAL CARE MOUTH RINSE: 15.0000 mL | Freq: Once | OROMUCOSAL | Status: AC

## 2020-07-01 MED ORDER — FENTANYL CITRATE (PF) 100 MCG/2ML IJ SOLN
INTRAMUSCULAR | Status: AC
  Filled 2020-07-01: qty 2

## 2020-07-01 MED ORDER — LIDOCAINE HCL (PF) 1 % IJ SOLN
INTRAMUSCULAR | Status: AC
  Filled 2020-07-01: qty 30

## 2020-07-01 MED ORDER — SODIUM CHLORIDE 0.9 % IV SOLN: INTRAVENOUS | Status: DC

## 2020-07-01 MED ORDER — LIDOCAINE HCL 1 % IJ SOLN: INTRAMUSCULAR | Status: DC | PRN

## 2020-07-01 MED ORDER — SODIUM CHLORIDE 0.9 % IV SOLN
INTRAVENOUS | Status: DC | PRN
  Administered 2020-07-01: 09:00:00 500 mL

## 2020-07-01 MED ORDER — CHLORHEXIDINE GLUCONATE 0.12 % MT SOLN
OROMUCOSAL | Status: AC
  Administered 2020-07-01: 15 mL via OROMUCOSAL
  Filled 2020-07-01: qty 15

## 2020-07-01 MED ORDER — SODIUM CHLORIDE 0.9 % IV SOLN
INTRAVENOUS | Status: AC
  Filled 2020-07-01: qty 1.2

## 2020-07-01 MED ORDER — FENTANYL CITRATE (PF) 100 MCG/2ML IJ SOLN
25.0000 ug | INTRAMUSCULAR | Status: DC | PRN
  Administered 2020-07-01 (×2): 50 ug via INTRAVENOUS

## 2020-07-01 MED ORDER — HEPARIN SODIUM (PORCINE) 1000 UNIT/ML IJ SOLN
INTRAMUSCULAR | Status: DC | PRN
  Administered 2020-07-01: 3000 [IU] via INTRAVENOUS

## 2020-07-01 MED ORDER — HEPARIN SODIUM (PORCINE) 1000 UNIT/ML IJ SOLN
1600.0000 [IU] | Freq: Once | INTRAMUSCULAR | Status: AC
  Administered 2020-07-01: 1600 [IU]

## 2020-07-01 MED ORDER — PROPOFOL 10 MG/ML IV BOLUS
INTRAVENOUS | Status: DC | PRN
  Administered 2020-07-01: 140 mg via INTRAVENOUS

## 2020-07-01 MED ORDER — OXYCODONE HCL 5 MG/5ML PO SOLN: 5.0000 mg | Freq: Once | ORAL | Status: AC | PRN

## 2020-07-01 MED ORDER — LIDOCAINE HCL (PF) 1 % IJ SOLN
INTRAMUSCULAR | Status: DC | PRN
  Administered 2020-07-01: 10 mL

## 2020-07-01 MED ORDER — PHENYLEPHRINE HCL (PRESSORS) 10 MG/ML IV SOLN: INTRAVENOUS | Status: DC | PRN

## 2020-07-01 MED ORDER — MIDAZOLAM HCL 2 MG/2ML IJ SOLN
INTRAMUSCULAR | Status: AC
  Filled 2020-07-01: qty 2

## 2020-07-01 MED ORDER — HYDROCODONE-ACETAMINOPHEN 5-325 MG PO TABS: 1.0000 | ORAL_TABLET | Freq: Four times a day (QID) | ORAL | 0 refills | Status: DC | PRN

## 2020-07-01 MED ORDER — MIDAZOLAM HCL 2 MG/2ML IJ SOLN
INTRAMUSCULAR | Status: DC | PRN
  Administered 2020-07-01: 2 mg via INTRAVENOUS

## 2020-07-01 MED ORDER — ONDANSETRON HCL 4 MG/2ML IJ SOLN: 4.0000 mg | Freq: Once | INTRAMUSCULAR | Status: DC | PRN

## 2020-07-01 MED ORDER — PHENYLEPHRINE HCL (PRESSORS) 10 MG/ML IV SOLN
INTRAVENOUS | Status: DC | PRN
  Administered 2020-07-01: 120 ug via INTRAVENOUS
  Administered 2020-07-01 (×2): 40 ug via INTRAVENOUS

## 2020-07-01 SURGICAL SUPPLY — 45 items
ADH SKN CLS APL DERMABOND .7 (GAUZE/BANDAGES/DRESSINGS) ×1
AGENT HMST SPONGE THK3/8 (HEMOSTASIS)
ARMBAND PINK RESTRICT EXTREMIT (MISCELLANEOUS) ×3 IMPLANT
CANISTER SUCT 3000ML PPV (MISCELLANEOUS) ×3 IMPLANT
CLIP VESOCCLUDE MED 24/CT (CLIP) ×3 IMPLANT
CLIP VESOCCLUDE SM WIDE 24/CT (CLIP) ×3 IMPLANT
COVER PROBE W GEL 5X96 (DRAPES) ×3 IMPLANT
COVER WAND RF STERILE (DRAPES) ×1 IMPLANT
DECANTER SPIKE VIAL GLASS SM (MISCELLANEOUS) ×3 IMPLANT
DERMABOND ADVANCED (GAUZE/BANDAGES/DRESSINGS) ×2
DERMABOND ADVANCED .7 DNX12 (GAUZE/BANDAGES/DRESSINGS) ×1 IMPLANT
ELECT REM PT RETURN 9FT ADLT (ELECTROSURGICAL) ×3
ELECTRODE REM PT RTRN 9FT ADLT (ELECTROSURGICAL) ×1 IMPLANT
GLOVE BIO SURGEON STRL SZ7.5 (GLOVE) ×3 IMPLANT
GLOVE BIOGEL PI IND STRL 6.5 (GLOVE) IMPLANT
GLOVE BIOGEL PI IND STRL 8 (GLOVE) ×1 IMPLANT
GLOVE BIOGEL PI INDICATOR 6.5 (GLOVE) ×2
GLOVE BIOGEL PI INDICATOR 8 (GLOVE) ×2
GLOVE ECLIPSE 6.5 STRL STRAW (GLOVE) ×4 IMPLANT
GLOVE ECLIPSE 7.0 STRL STRAW (GLOVE) ×2 IMPLANT
GOWN STRL REUS W/ TWL LRG LVL3 (GOWN DISPOSABLE) ×2 IMPLANT
GOWN STRL REUS W/ TWL XL LVL3 (GOWN DISPOSABLE) ×2 IMPLANT
GOWN STRL REUS W/TWL LRG LVL3 (GOWN DISPOSABLE) ×6
GOWN STRL REUS W/TWL XL LVL3 (GOWN DISPOSABLE) ×3
HEMOSTAT SPONGE AVITENE ULTRA (HEMOSTASIS) IMPLANT
KIT BASIN OR (CUSTOM PROCEDURE TRAY) ×3 IMPLANT
KIT TURNOVER KIT B (KITS) ×3 IMPLANT
NDL HYPO 25GX1X1/2 BEV (NEEDLE) ×1 IMPLANT
NEEDLE HYPO 25GX1X1/2 BEV (NEEDLE) ×3 IMPLANT
NS IRRIG 1000ML POUR BTL (IV SOLUTION) ×3 IMPLANT
PACK CV ACCESS (CUSTOM PROCEDURE TRAY) ×3 IMPLANT
PAD ARMBOARD 7.5X6 YLW CONV (MISCELLANEOUS) ×6 IMPLANT
SUT MNCRL AB 4-0 PS2 18 (SUTURE) ×3 IMPLANT
SUT PROLENE 6 0 BV (SUTURE) ×3 IMPLANT
SUT PROLENE 6 0 CC (SUTURE) ×2 IMPLANT
SUT PROLENE 7 0 BV 1 (SUTURE) IMPLANT
SUT SILK 2 0 SH (SUTURE) ×2 IMPLANT
SUT VIC AB 2-0 CT1 27 (SUTURE)
SUT VIC AB 2-0 CT1 TAPERPNT 27 (SUTURE) ×1 IMPLANT
SUT VIC AB 3-0 SH 27 (SUTURE) ×6
SUT VIC AB 3-0 SH 27X BRD (SUTURE) ×2 IMPLANT
SYR TOOMEY 50ML (SYRINGE) ×2 IMPLANT
TOWEL GREEN STERILE (TOWEL DISPOSABLE) ×3 IMPLANT
UNDERPAD 30X36 HEAVY ABSORB (UNDERPADS AND DIAPERS) ×3 IMPLANT
WATER STERILE IRR 1000ML POUR (IV SOLUTION) ×3 IMPLANT

## 2020-07-01 NOTE — Op Note (Signed)
    OPERATIVE NOTE   PROCEDURE: right second stage basilic vein transposition (brachiobasilic arteriovenous fistula) placement  PRE-OPERATIVE DIAGNOSIS: ESRD  POST-OPERATIVE DIAGNOSIS: same  SURGEON: [name], MD  ASSISTANT(S): OR staff  ANESTHESIA: general  ESTIMATED BLOOD LOSS: <75 mL  FINDING(S): Right upper arm basilic vein was mobilized through three skip incisions.  It was then transposed through a new tunnel after the vein was transected near the arterial anastomosis.  At completion there was an excellent thrill and a palpable radial pulse at the wrist.  SPECIMEN(S):  None  INDICATIONS:   [name] is a 44 y.o. female who presents with ESRD and need for permanent hemodialysis access.  The patient is scheduled for right second stage basilic vein transposition.  The patient is aware the risks include but are not limited to: bleeding, infection, steal syndrome, nerve damage, ischemic monomelic neuropathy, failure to mature, and need for additional procedures.  The patient is aware of the risks of the procedure and elects to proceed forward.   DESCRIPTION: After full informed written consent was obtained from the patient, the patient was brought back to the operating room and placed supine upon the operating table.  Prior to induction, the patient received IV antibiotics.   After obtaining adequate anesthesia, the patient was then prepped and draped in the standard fashion for a right arm access procedure.  I turned my attention first to identifying the patient's brachiobasilic arteriovenous fistula.  Using SonoSite guidance, the location of this fistula was marked out on the skin.    This was an excellent caliber vein.  I made three longitudinal incisions on the medial aspect of the right upper arm.  Through these incisions I dissected out circumferentially the basilic vein, taking care to protect the nerve.  Once the vein was fully mobilized, all side branches  were ligated between silk ties.  The vein was marked for orientation.  I then used a curved tunneler to create a subcutaneous tunnel.  The patient was given 3000 units IV heparin.  The vein was then transected near the antecubital crease.  It was then brought to the previously created tunnel making sure to maintain proper orientation.  A primary anastomosis was then performed between the two cut ends of the vein after it was spatulated with a running 6-0 Prolene.  Once this was done the clamps were released.  There was excellent flow through the fistula.  Hemostasis was then achieved.  The wound was irrigated.  The incision was closed with a deep layer of 3-0 Vicryl followed by a subcutaneous 4-0 Monocryl and Dermabond.  There were no immediate complications.  COMPLICATIONS: None  CONDITION: None  [name], MD [facility]: [phone]  07/01/2020, 9:51 AM

## 2020-07-01 NOTE — Transfer of Care (Signed)
Immediate Anesthesia Transfer of Care Note  Patient: [name]  Procedure(s) Performed: SECOND STAGE BASCILIC VEIN TRANSPOSITION RIGHT UPPER EXTREMITY (Right Arm Upper)  Patient Location: PACU  Anesthesia Type:General  Level of Consciousness: drowsy  Airway & Oxygen Therapy: Patient Spontanous Breathing and Patient connected to face mask oxygen  Post-op Assessment: Report given to RN and Post -op Vital signs reviewed and stable  Post vital signs: Reviewed and stable  Last Vitals:  Vitals Value Taken Time  BP 125/67 07/01/20 0955  Temp    Pulse 97 07/01/20 0957  Resp 13 07/01/20 0957  SpO2 97 % 07/01/20 0957  Vitals shown include unvalidated device data.  Last Pain:  Vitals:   07/01/20 0616  TempSrc: Temporal  PainSc:       Patients Stated Pain Goal: 3 (73/95/84 4171)  Complications: No complications documented.

## 2020-07-01 NOTE — Anesthesia Postprocedure Evaluation (Signed)
Anesthesia Post Note  Patient: [name]  Procedure(s) Performed: SECOND STAGE BASCILIC VEIN TRANSPOSITION RIGHT UPPER EXTREMITY (Right Arm Upper)     Patient location during evaluation: PACU Anesthesia Type: General Level of consciousness: awake and alert Pain management: pain level controlled Vital Signs Assessment: post-procedure vital signs reviewed and stable Respiratory status: spontaneous breathing, nonlabored ventilation and respiratory function stable Cardiovascular status: blood pressure returned to baseline and stable Postop Assessment: no apparent nausea or vomiting Anesthetic complications: no   No complications documented.  Last Vitals:  Vitals:   07/01/20 1010 07/01/20 1030  BP: 126/74 108/70  Pulse: 97 89  Resp: 11 16  Temp:  36.8 C  SpO2: 97% 99%    Last Pain:  Vitals:   07/01/20 1030  TempSrc:   PainSc: 7                  Nachum Derossett E Damione Robideau

## 2020-07-01 NOTE — Anesthesia Procedure Notes (Signed)
Procedure Name: LMA Insertion Date/Time: 07/01/2020 7:48 AM Performed by: Clearnce Sorrel, CRNA Pre-anesthesia Checklist: Patient identified, Emergency Drugs available, Suction available, Patient being monitored and Timeout performed Patient Re-evaluated:Patient Re-evaluated prior to induction Oxygen Delivery Method: Circle system utilized Preoxygenation: Pre-oxygenation with 100% oxygen Induction Type: IV induction LMA: LMA inserted LMA Size: 4.0 Number of attempts: 1 Placement Confirmation: positive ETCO2 and breath sounds checked- equal and bilateral Tube secured with: Tape Dental Injury: Teeth and Oropharynx as per pre-operative assessment

## 2020-07-01 NOTE — H&P (Addendum)
History and Physical Interval Note:  07/01/2020 8:08 AM  [name]  has presented today for surgery, with the diagnosis of END STAGE RENAL DISEASE.  The various methods of treatment have been discussed with the patient and family. After consideration of risks, benefits and other options for treatment, the patient has consented to  Procedure(s): [place] (Right) as a surgical intervention.  The patient's history has been reviewed, patient examined, no change in status, stable for surgery.  I have reviewed the patient's chart and labs.  Questions were answered to the patient's satisfaction.    Right 2nd stage BVT.  [name]   Postoperative Access Visit   History of Present Illness   [name] is a 44 y.o. year old female who presents for postoperative follow-up for: right first stage basilic vein transposition on 04/25/20 by Dr. Carlis Abbott. The patient's wound is well healed.  The patient notes minimal steal symptoms. She does have numbness of her right 4th finger, it has improved some since her surgery. The patient is able to complete their activities of daily living.  She previously had undergone an attempted thrombectomy of her left forearm AV graft but it unfortunately failed. She has a right IJ TDC. 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)    tthsat [facility] [place]   GERD (gastroesophageal reflux disease)    Headache(784.0)    History of high blood pressure    Hyperlipemia    Hypertension    Lupus ([place])    Pneumonia    Secondary hyperparathyroidism (of renal origin)     Past Surgical History:  Procedure Laterality Date    A/V FISTULAGRAM Left 12/02/2017   Procedure: A/V FISTULAGRAM - Left upper;  Surgeon: Angelia Mould, MD;  Location: [place] [facility];  Service: Cardiovascular;  Laterality: Left;   AV FISTULA PLACEMENT Left    AV FISTULA PLACEMENT Right 01/26/2014   Procedure: ARTERIOVENOUS (AV) FISTULA CREATION; ultrasound guided;  Surgeon: Conrad Morris, MD;  Location: [place];  Service: Vascular;  Laterality: Right;   AV FISTULA PLACEMENT Right 04/25/2020   Procedure: RIGHT UPPER ARM  BASILIC VEIN ARTERIOVENOUS (AV) FISTULA;  Surgeon: [name], MD;  Location: [place];  Service: Vascular;  Laterality: Right;   ESOPHAGOGASTRODUODENOSCOPY N/A 03/17/2016   Procedure: ESOPHAGOGASTRODUODENOSCOPY (EGD);  Surgeon: Irene Shipper, MD;  Location: [facility] ENDOSCOPY;  Service: Endoscopy;  Laterality: N/A;   FISTULOGRAM Left 09/24/2019   Procedure: FISTULOGRAM LEFT ARM;  Surgeon: [name], MD;  Location: [place];  Service: Vascular;  Laterality: Left;   HEMATOMA EVACUATION Left 10/09/2013   Procedure: EVACUATION HEMATOMA;  Surgeon: Angelia Mould, MD;  Location: [place];  Service: Vascular;  Laterality: Left;   HEMATOMA EVACUATION Left 09/24/2019   Procedure: EVACUATION HEMATOMA  LEFT ARM;  Surgeon: [name], MD;  Location: [place];  Service: Vascular;  Laterality: Left;   INCISION AND DRAINAGE ABSCESS     gluteal   INSERTION OF DIALYSIS CATHETER N/A 10/09/2013   Procedure: INSERTION OF DIALYSIS CATHETER; ULTRASOUND GUIDED;  Surgeon: Angelia Mould, MD;  Location: [place];  Service: Vascular;  Laterality: N/A;   INSERTION OF DIALYSIS CATHETER Left 09/24/2019   Procedure: INSERTION OF TUNNELLED DIALYSIS CATHETER - PALINDROME 15FR X 28CM;  Surgeon: [name], MD;  Location[facility] OR;  Service: Vascular;  Laterality: Left;   INSERTION OF DIALYSIS CATHETER Right 03/15/2020   Procedure: Insertion Of Dialysis Catheter;  Surgeon: Angelia Mould, MD;  Location: [place];   Service: Cardiovascular;  Laterality: Right;   PERIPHERAL VASCULAR BALLOON ANGIOPLASTY Left 12/02/2017   Procedure: PERIPHERAL VASCULAR BALLOON ANGIOPLASTY;  Surgeon: Angelia Mould, MD;  Location: [place] [facility];  Service: Cardiovascular;  Laterality: Left;   REVISION OF ARTERIOVENOUS GORETEX GRAFT Left 04/03/2019   Procedure: REVISION OF ARTERIOVENOUS GORETEX GRAFT PSEUDOANEURYSM;  Surgeon: Angelia Mould, MD;  Location: [place];  Service: Vascular;  Laterality: Left;   SHUNTOGRAM N/A 01/07/2014   Procedure: fistulogram with possibe venoplasty left upper arm avg;  Surgeon: Angelia Mould, MD;  Location: [facility] CATH LAB;  Service: Cardiovascular;  Laterality: N/A;   THROMBECTOMY AND REVISION OF ARTERIOVENTOUS (AV) GORETEX  GRAFT Left 03/15/2020   Procedure: ATTEMPTED THROMBECTOMY OF LEFT ARM ARTERIOVENTOUS (AV) GORETEX  GRAFT;  Surgeon: Angelia Mould, MD;  Location: [place];  Service: Cardiovascular;  Laterality: Left;   tibiocalcaneal fusion  left Left    ULTRASOUND GUIDANCE FOR VASCULAR ACCESS  09/24/2019   Procedure: Ultrasound Guidance For Vascular Access;  Surgeon: [name], MD;  Location: [facility] OR;  Service: Vascular;;      Physical Examination      Vitals:   06/01/20 0836  BP: (!) 148/102  Pulse: (!) 102  Resp: 20  Temp: (!) 97.4 F (36.3 C)  TempSrc: Temporal  SpO2: 97%  Weight: 133 lb 12.8 oz (60.7 kg)  Height: 5\' 4"  (1.626 m)   Body mass index is 22.97 kg/m.  right arm AC Incision is well healed, 2+ radial pulse, hand grip is 5/5, sensation in digits is  intact, palpable thrill, bruit can be auscultated     Medical Decision Making    [name] is a 44 y.o. year old female who presents s/p right first stage basilic vein transposition on 04/25/20 by Dr. Carlis Abbott. Her incision is well healed. Her duplex today shows that her vein is well matured but is too deep in the right upper extremity. She is currently without any  steal symptoms. She is currently dialyzing TTS via right IJ TDC  I discussed with patient that she needs a second stage transposition of her fistula due to the depth of the fistula in her right upper extremity  The patient is aware that the risks include but are not limited to: bleeding, infection, steal syndrome, nerve damage, thrombosis, failure to mature, and need for additional procedures.   The patient agrees to proceed with the procedure  She is not on any blood thinners that need to be held for surgery  I will schedule her for a second stage BV transposition with Dr. Carlis Abbott on a non dialysis day

## 2020-07-01 NOTE — Discharge Instructions (Signed)
° °  [facility] [place] ° °Discharge Instructions ° °AV Fistula or Graft Surgery for Dialysis Access ° °Please refer to the following instructions for your post-procedure care. Your surgeon or physician assistant will discuss any changes with you. ° °Activity ° °You may drive the day following your surgery, if you are comfortable and no longer taking prescription pain medication. Resume full activity as the soreness in your incision resolves. ° °Bathing/Showering ° °You may shower after you go home. Keep your incision dry for 48 hours. Do not soak in a bathtub, hot tub, or swim until the incision heals completely. You may not shower if you have a hemodialysis catheter. ° °Incision Care ° °Clean your incision with mild soap and water after 48 hours. Pat the area dry with a clean towel. You do not need a bandage unless otherwise instructed. Do not apply any ointments or creams to your incision. You may have skin glue on your incision. Do not peel it off. It will come off on its own in about one week. Your arm may swell a bit after surgery. To reduce swelling use pillows to elevate your arm so it is above your heart. Your doctor will tell you if you need to lightly wrap your arm with an ACE bandage. ° °Diet ° °Resume your normal diet. There are not special food restrictions following this procedure. In order to heal from your surgery, it is CRITICAL to get adequate nutrition. Your body requires vitamins, minerals, and protein. Vegetables are the best source of vitamins and minerals. Vegetables also provide the perfect balance of protein. Processed food has little nutritional value, so try to avoid this. ° °Medications ° °Resume taking all of your medications. If your incision is causing pain, you may take over-the counter pain relievers such as acetaminophen (Tylenol). If you were prescribed a stronger pain medication, please be aware these medications can cause nausea and constipation. Prevent  nausea by taking the medication with a snack or meal. Avoid constipation by drinking plenty of fluids and eating foods with high amount of fiber, such as fruits, vegetables, and grains. Do not take Tylenol if you are taking prescription pain medications. ° ° ° ° °Follow up °Your surgeon may want to see you in the office following your access surgery. If so, this will be arranged at the time of your surgery. ° °Please call us immediately for any of the following conditions: ° °Increased pain, redness, drainage (pus) from your incision site °Fever of 101 degrees or higher °Severe or worsening pain at your incision site °Hand pain or numbness. ° °Reduce your risk of vascular disease: ° °Stop smoking. If you would like help, call [facility] at 1-800-QUIT-NOW (1-800-784-8669) or [facility] at 336-586-4000 ° °Manage your cholesterol °Maintain a desired weight °Control your diabetes °Keep your blood pressure down ° °Dialysis ° °It will take several weeks to several months for your new dialysis access to be ready for use. Your surgeon will determine when it is OK to use it. Your nephrologist will continue to direct your dialysis. You can continue to use your Permcath until your new access is ready for use. ° °If you have any questions, please call the office at [phone]. ° °

## 2020-07-01 NOTE — Anesthesia Preprocedure Evaluation (Signed)
Anesthesia Evaluation  Patient identified by MRN, date of birth, ID band Patient awake    Reviewed: Allergy & Precautions, NPO status , Patient's Chart, lab work & pertinent test results  History of Anesthesia Complications Negative for: history of anesthetic complications  Airway Mallampati: II  TM Distance: >3 FB Neck ROM: Full    Dental  (+) Missing, Dental Advisory Given   Pulmonary neg pulmonary ROS,    Pulmonary exam normal        Cardiovascular hypertension, Normal cardiovascular exam+ Valvular Problems/Murmurs MR      Neuro/Psych Anxiety Depression negative neurological ROS     GI/Hepatic Neg liver ROS, PUD, GERD  ,  Endo/Other  Secondary hyperparathyroidism Chronic prednisone use  Renal/GU ESRF and DialysisRenal disease  negative genitourinary   Musculoskeletal  (+) Arthritis ,   Abdominal   Peds  Hematology negative hematology ROS (+)   Anesthesia Other Findings  Lupus  Echo 2019: severe LVH, EF 65-70%, gr1dd, mild MS, mild-mod regurgitation, PASP 31  Reproductive/Obstetrics                            Anesthesia Physical Anesthesia Plan  ASA: III  Anesthesia Plan: General   Post-op Pain Management:    Induction: Intravenous  PONV Risk Score and Plan: 3 and Ondansetron, Dexamethasone, Midazolam and Treatment may vary due to age or medical condition  Airway Management Planned: LMA  Additional Equipment: None  Intra-op Plan:   Post-operative Plan: Extubation in OR  Informed Consent: I have reviewed the patients History and Physical, chart, labs and discussed the procedure including the risks, benefits and alternatives for the proposed anesthesia with the patient or authorized representative who has indicated his/her understanding and acceptance.     Dental advisory given  Plan Discussed with:   Anesthesia Plan Comments:         Anesthesia Quick  Evaluation

## 2020-07-02 ENCOUNTER — Encounter (HOSPITAL_COMMUNITY): Payer: Self-pay | Admitting: Vascular Surgery

## 2020-07-19 ENCOUNTER — Ambulatory Visit: Payer: Medicare Other

## 2020-07-20 ENCOUNTER — Other Ambulatory Visit: Payer: Self-pay

## 2020-07-20 ENCOUNTER — Ambulatory Visit (INDEPENDENT_AMBULATORY_CARE_PROVIDER_SITE_OTHER): Payer: Self-pay | Admitting: Physician Assistant

## 2020-07-20 VITALS — BP 146/95 | HR 93 | Temp 98.1°F | Resp 20 | Ht 64.0 in | Wt 132.3 lb

## 2020-07-20 DIAGNOSIS — N186 End stage renal disease: Secondary | ICD-10-CM

## 2020-07-20 DIAGNOSIS — Z992 Dependence on renal dialysis: Secondary | ICD-10-CM

## 2020-07-20 NOTE — Progress Notes (Signed)
POST OPERATIVE OFFICE NOTE    CC:  F/u for surgery  HPI:  This is a 44 y.o. 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 Carlis Abbott.  Her original first stage was created on 04/25/2020 also by Dr. Carlis Abbott.  She was seen on 06/01/2020 and at that time, she was having numbness in her right 4th finger and it had improved some since the creation of hier fistula.  She previously had undergone an attempted thrombectomy of the left FA AVG but it did fail.    Pt returns today for follow up.  She states she is doing well.  The numbness in her right 4th finger has resolved and she is doing well.  She does not have any pain in her right hand.     The pt is on dialysis T/T/S at [facility] location with Dr. Theador Hawthorne.  She is currently using a right IJ TDC.    Allergies  Allergen Reactions  . Amlodipine Hives and Swelling  . Sulfa Antibiotics Hives and Itching  . Vancomycin Hives and Itching  . Venlafaxine Other (See Comments)    Emotional    Current Outpatient Medications  Medication Sig Dispense Refill  . acetaminophen (TYLENOL) 500 MG tablet Take 500 mg by mouth every 6 (six) hours as needed for moderate pain or headache.    . Carboxymethylcellul-Glycerin (LUBRICATING EYE DROPS OP) Place 1 drop into both eyes daily as needed (dry eyes).    Marland Kitchen diltiazem (TIAZAC) 240 MG 24 hr capsule Take 240 mg by mouth at bedtime.     . hydrALAZINE (APRESOLINE) 50 MG tablet Take 50 mg by mouth 2 (two) times daily.    Marland Kitchen HYDROcodone-acetaminophen (NORCO/VICODIN) 5-325 MG tablet Take 1 tablet by mouth every 6 (six) hours as needed for moderate pain. 12 tablet 0  . pantoprazole (PROTONIX) 40 MG tablet Take 1 tablet (40 mg total) by mouth daily. 30 tablet 3  . PARoxetine (PAXIL) 40 MG tablet Take 40 mg by mouth daily.    . predniSONE (DELTASONE) 10 MG tablet Take 1 tablet (10 mg total) by mouth daily with breakfast. 90 tablet 3  . sevelamer carbonate (RENVELA) 800 MG tablet Take 1,600-3,200 mg by mouth See admin  instructions. Take 3200 mg with each meal and 1600 mg with each snack    . traZODone (DESYREL) 100 MG tablet Take 100 mg by mouth at bedtime.     No current facility-administered medications for this visit.     ROS:  See HPI  Physical Exam:  Today's Vitals   07/20/20 0849  BP: (!) 146/95  Pulse: 93  Resp: 20  Temp: 98.1 F (36.7 C)  TempSrc: Temporal  SpO2: 97%  Weight: 132 lb 4.8 oz (60 kg)  Height: 5\' 4"  (1.626 m)  PainSc: 3   PainLoc: Arm   Body mass index is 22.71 kg/m.   Incision:  All incisions are healing well Extremities:   There is a palpable right radial pulse.   Motor and sensory are in tact.   There is a thrill/bruit present.     Assessment/Plan:  This is a 44 y.o. 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 Carlis Abbott.  Her original first stage was created on 04/25/2020 also by Dr. Carlis Abbott.  -the pt does not have evidence of steal.  The numbness in her right 4th finger has resolved.  -the fistula/graft can be used 08/03/2020. -If pt has a tunneled dialysis catheter and the access  has been used successfully to the satisfaction of the dialysis center, the tunneled catheter can be removed at their discretion.   -the pt will follow up as needed   Leontine Locket, [facility] [phone]  Clinic MD:  Scot Dock

## 2020-09-06 ENCOUNTER — Telehealth: Payer: Self-pay

## 2020-09-06 NOTE — Telephone Encounter (Signed)
Per Juanda Crumble HD nurse, patient's R fistula swells when the insert the needle, and she has numb fingers and bruising on the fistula site. Scheduled her for appt this week.

## 2020-09-09 ENCOUNTER — Ambulatory Visit (INDEPENDENT_AMBULATORY_CARE_PROVIDER_SITE_OTHER): Payer: Medicare Other | Admitting: Physician Assistant

## 2020-09-09 ENCOUNTER — Other Ambulatory Visit: Payer: Self-pay

## 2020-09-09 ENCOUNTER — Ambulatory Visit (HOSPITAL_COMMUNITY)
Admission: RE | Admit: 2020-09-09 | Discharge: 2020-09-09 | Disposition: A | Discharge status: Home / Self Care | Payer: Medicare Other | Source: Ambulatory Visit | Attending: Vascular Surgery | Admitting: Vascular Surgery

## 2020-09-09 VITALS — BP 141/90 | HR 93 | Temp 97.3°F | Resp 20 | Ht 64.0 in | Wt 133.1 lb

## 2020-09-09 DIAGNOSIS — N186 End stage renal disease: Secondary | ICD-10-CM

## 2020-09-09 DIAGNOSIS — Z992 Dependence on renal dialysis: Secondary | ICD-10-CM | POA: Insufficient documentation

## 2020-09-09 NOTE — Progress Notes (Signed)
Established Dialysis Access   History of Present Illness   [name] is a 44 y.o. (12/03/1975) female who presents for follow up of her right BVT fistula as she has had bruising, swelling and numbness after dialysis. 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 Carlis Abbott.  Her original first stage was created on 04/25/2020 also by Dr. Carlis Abbott. She has been doing well and without any steal symptoms, however when her fistula was able to be canalized starting on 08/03/20 it has infiltrated the three times they have attempted to access it. Since the three attempts the arm has been swollen and tender. Her bruising is slowly resolving and she does not report any numbness presently. The dialysis center has since been using her [facility], which continues to work well. She still dialyzes on Tues/ Thurs/ Sat at the [place] location.  The patient's PMH, PSH, SH, and FamHx were reviewed and are unchanged from her last visit  Current Outpatient Medications  Medication Sig Dispense Refill  . acetaminophen (TYLENOL) 500 MG tablet Take 500 mg by mouth every 6 (six) hours as needed for moderate pain or headache.    . Carboxymethylcellul-Glycerin (LUBRICATING EYE DROPS OP) Place 1 drop into both eyes daily as needed (dry eyes).    Marland Kitchen diltiazem (TIAZAC) 240 MG 24 hr capsule Take 240 mg by mouth at bedtime.     . hydrALAZINE (APRESOLINE) 50 MG tablet Take 50 mg by mouth 2 (two) times daily.    Marland Kitchen HYDROcodone-acetaminophen (NORCO/VICODIN) 5-325 MG tablet Take 1 tablet by mouth every 6 (six) hours as needed for moderate pain. 12 tablet 0  . pantoprazole (PROTONIX) 40 MG tablet Take 1 tablet (40 mg total) by mouth daily. 30 tablet 3  . PARoxetine (PAXIL) 40 MG tablet Take 40 mg by mouth daily.    . predniSONE (DELTASONE) 10 MG tablet Take 1 tablet (10 mg total) by mouth daily with breakfast. 90 tablet 3  . sevelamer carbonate (RENVELA) 800 MG tablet Take 1,600-3,200 mg by mouth See admin instructions. Take 3200 mg with  each meal and 1600 mg with each snack    . traZODone (DESYREL) 100 MG tablet Take 100 mg by mouth at bedtime.     No current facility-administered medications for this visit.    On ROS today: negative unless stated in HPI   Physical Examination   Vitals:   09/09/20 1433  BP: (!) 141/90  Pulse: 93  Resp: 20  Temp: (!) 97.3 F (36.3 C)  TempSrc: Temporal  SpO2: 97%  Weight: 133 lb 1.6 oz (60.4 kg)  Height: 5\' 4"  (1.626 m)   Body mass index is 22.85 kg/m.  General Well nourished, well appearing, not in acute distress  Pulmonary Non labored  Cardiac Regular rate and rhythm  Vascular Vessel Right Left  Radial Palpable Palpable  Brachial Palpable Palpable  Ulnar Faintly palpable Faintly palpable    Musculo- skeletal Right AV fistula with good thrill and audible bruit. Does not feel pulsatile. She does have edema and swelling of the left upper arm and forearm. Tenderness to palpation. No erythema M/S 5/5 throughout  , Extremities without ischemic changes    Neurologic A&O; CN grossly intact     Non-invasive Vascular Imaging   right Arm Access Duplex  (09/09/20):   In the proximal upper right arm there is an area of narrowing (PSV:  475 c/s), the mid upper arm there is partially occlusive thrombus and partially occlusive thrombus also visualized  at the [facility] fossa  Volume flow: 669 mL/min   Medical Decision Making   [name] is a 44 y.o. female who presents for follow up of right basilic vein fistula. This fistula was initially placed 04/25/20 and elevated on 07/01/20 by Dr. Carlis Abbott. The fistula is working well but duplex shows an area of stenosis in the proximal upper arm and two areas of thrombus. At dialysis she has had her fistula infiltrated on three attempts to use it now causing upper extremity pain and swelling. She otherwise denies any steal symptoms.   She currently dialyzes on Tues/ Thurs/ Sat at the [place] location. She is using her right IJ TDC  I have  advised her to continue to use her [facility] in the interim to give her RUE rest  Recommend she elevate her right upper arm to help with swelling  I have scheduled her for a right upper extremity Fistulogram with Dr. Carlis Abbott. Will try to have this scheduled on a non dialysis day but told her depending on scheduling her dialysis may need to be changed to accommodate her procedure   Mary Caldwell, PA-C [facility] [place] Office: [phone]  Clinic MD: Dr. Donzetta Matters

## 2020-09-17 ENCOUNTER — Other Ambulatory Visit: Payer: Self-pay

## 2020-09-17 ENCOUNTER — Emergency Department (HOSPITAL_COMMUNITY): Payer: Medicare Other

## 2020-09-17 ENCOUNTER — Inpatient Hospital Stay (HOSPITAL_COMMUNITY)
Admission: EM | Admit: 2020-09-17 | Discharge: 2020-09-23 | DRG: 040 | Disposition: A | Discharge status: Rehabilitation Facility | Payer: Medicare Other | Attending: Family Medicine | Admitting: Family Medicine

## 2020-09-17 ENCOUNTER — Encounter (HOSPITAL_COMMUNITY): Payer: Self-pay | Admitting: Emergency Medicine

## 2020-09-17 DIAGNOSIS — I351 Nonrheumatic aortic (valve) insufficiency: Secondary | ICD-10-CM | POA: Diagnosis not present

## 2020-09-17 DIAGNOSIS — Z882 Allergy status to sulfonamides status: Secondary | ICD-10-CM

## 2020-09-17 DIAGNOSIS — F419 Anxiety disorder, unspecified: Secondary | ICD-10-CM | POA: Diagnosis present

## 2020-09-17 DIAGNOSIS — R29700 NIHSS score 0: Secondary | ICD-10-CM | POA: Diagnosis present

## 2020-09-17 DIAGNOSIS — H5509 Other forms of nystagmus: Secondary | ICD-10-CM | POA: Diagnosis present

## 2020-09-17 DIAGNOSIS — I6389 Other cerebral infarction: Secondary | ICD-10-CM | POA: Diagnosis not present

## 2020-09-17 DIAGNOSIS — E1122 Type 2 diabetes mellitus with diabetic chronic kidney disease: Secondary | ICD-10-CM | POA: Diagnosis present

## 2020-09-17 DIAGNOSIS — I10 Essential (primary) hypertension: Secondary | ICD-10-CM | POA: Diagnosis not present

## 2020-09-17 DIAGNOSIS — Z8249 Family history of ischemic heart disease and other diseases of the circulatory system: Secondary | ICD-10-CM

## 2020-09-17 DIAGNOSIS — I959 Hypotension, unspecified: Secondary | ICD-10-CM | POA: Diagnosis not present

## 2020-09-17 DIAGNOSIS — D6859 Other primary thrombophilia: Secondary | ICD-10-CM | POA: Diagnosis present

## 2020-09-17 DIAGNOSIS — I63549 Cerebral infarction due to unspecified occlusion or stenosis of unspecified cerebellar artery: Secondary | ICD-10-CM | POA: Diagnosis present

## 2020-09-17 DIAGNOSIS — F32A Depression, unspecified: Secondary | ICD-10-CM | POA: Diagnosis not present

## 2020-09-17 DIAGNOSIS — E8889 Other specified metabolic disorders: Secondary | ICD-10-CM | POA: Diagnosis present

## 2020-09-17 DIAGNOSIS — R110 Nausea: Secondary | ICD-10-CM

## 2020-09-17 DIAGNOSIS — I120 Hypertensive chronic kidney disease with stage 5 chronic kidney disease or end stage renal disease: Secondary | ICD-10-CM | POA: Diagnosis present

## 2020-09-17 DIAGNOSIS — N186 End stage renal disease: Secondary | ICD-10-CM

## 2020-09-17 DIAGNOSIS — Z992 Dependence on renal dialysis: Secondary | ICD-10-CM | POA: Diagnosis not present

## 2020-09-17 DIAGNOSIS — Z8711 Personal history of peptic ulcer disease: Secondary | ICD-10-CM

## 2020-09-17 DIAGNOSIS — D6959 Other secondary thrombocytopenia: Secondary | ICD-10-CM | POA: Diagnosis present

## 2020-09-17 DIAGNOSIS — R270 Ataxia, unspecified: Secondary | ICD-10-CM | POA: Diagnosis present

## 2020-09-17 DIAGNOSIS — I63542 Cerebral infarction due to unspecified occlusion or stenosis of left cerebellar artery: Secondary | ICD-10-CM | POA: Diagnosis not present

## 2020-09-17 DIAGNOSIS — I340 Nonrheumatic mitral (valve) insufficiency: Secondary | ICD-10-CM | POA: Diagnosis not present

## 2020-09-17 DIAGNOSIS — G4700 Insomnia, unspecified: Secondary | ICD-10-CM | POA: Diagnosis present

## 2020-09-17 DIAGNOSIS — T380X5A Adverse effect of glucocorticoids and synthetic analogues, initial encounter: Secondary | ICD-10-CM

## 2020-09-17 DIAGNOSIS — E785 Hyperlipidemia, unspecified: Secondary | ICD-10-CM | POA: Diagnosis present

## 2020-09-17 DIAGNOSIS — F418 Other specified anxiety disorders: Secondary | ICD-10-CM | POA: Diagnosis present

## 2020-09-17 DIAGNOSIS — I63449 Cerebral infarction due to embolism of unspecified cerebellar artery: Secondary | ICD-10-CM | POA: Diagnosis not present

## 2020-09-17 DIAGNOSIS — R739 Hyperglycemia, unspecified: Secondary | ICD-10-CM | POA: Diagnosis not present

## 2020-09-17 DIAGNOSIS — I639 Cerebral infarction, unspecified: Secondary | ICD-10-CM | POA: Diagnosis present

## 2020-09-17 DIAGNOSIS — R42 Dizziness and giddiness: Secondary | ICD-10-CM

## 2020-09-17 DIAGNOSIS — Z20822 Contact with and (suspected) exposure to covid-19: Secondary | ICD-10-CM | POA: Diagnosis present

## 2020-09-17 DIAGNOSIS — Z888 Allergy status to other drugs, medicaments and biological substances status: Secondary | ICD-10-CM

## 2020-09-17 DIAGNOSIS — K219 Gastro-esophageal reflux disease without esophagitis: Secondary | ICD-10-CM | POA: Diagnosis present

## 2020-09-17 DIAGNOSIS — M8700 Idiopathic aseptic necrosis of unspecified bone: Secondary | ICD-10-CM

## 2020-09-17 DIAGNOSIS — E875 Hyperkalemia: Secondary | ICD-10-CM | POA: Diagnosis not present

## 2020-09-17 DIAGNOSIS — R2689 Other abnormalities of gait and mobility: Secondary | ICD-10-CM | POA: Diagnosis not present

## 2020-09-17 DIAGNOSIS — I4510 Unspecified right bundle-branch block: Secondary | ICD-10-CM | POA: Diagnosis present

## 2020-09-17 DIAGNOSIS — I083 Combined rheumatic disorders of mitral, aortic and tricuspid valves: Secondary | ICD-10-CM | POA: Diagnosis present

## 2020-09-17 DIAGNOSIS — M3214 Glomerular disease in systemic lupus erythematosus: Secondary | ICD-10-CM | POA: Diagnosis present

## 2020-09-17 DIAGNOSIS — R600 Localized edema: Secondary | ICD-10-CM | POA: Diagnosis present

## 2020-09-17 DIAGNOSIS — R197 Diarrhea, unspecified: Secondary | ICD-10-CM | POA: Diagnosis not present

## 2020-09-17 DIAGNOSIS — Z8673 Personal history of transient ischemic attack (TIA), and cerebral infarction without residual deficits: Secondary | ICD-10-CM | POA: Diagnosis not present

## 2020-09-17 DIAGNOSIS — D72829 Elevated white blood cell count, unspecified: Secondary | ICD-10-CM

## 2020-09-17 DIAGNOSIS — Z79899 Other long term (current) drug therapy: Secondary | ICD-10-CM

## 2020-09-17 DIAGNOSIS — D649 Anemia, unspecified: Secondary | ICD-10-CM

## 2020-09-17 DIAGNOSIS — I342 Nonrheumatic mitral (valve) stenosis: Secondary | ICD-10-CM | POA: Diagnosis not present

## 2020-09-17 DIAGNOSIS — M329 Systemic lupus erythematosus, unspecified: Secondary | ICD-10-CM | POA: Diagnosis not present

## 2020-09-17 DIAGNOSIS — Z7952 Long term (current) use of systemic steroids: Secondary | ICD-10-CM

## 2020-09-17 DIAGNOSIS — I361 Nonrheumatic tricuspid (valve) insufficiency: Secondary | ICD-10-CM | POA: Diagnosis not present

## 2020-09-17 HISTORY — DX: Cerebral infarction, unspecified: I63.9

## 2020-09-17 LAB — DIFFERENTIAL
Abs Immature Granulocytes: 0.17 10*3/uL — ABNORMAL HIGH (ref 0.00–0.07)
Basophils Absolute: 0.1 10*3/uL (ref 0.0–0.1)
Basophils Relative: 0 %
Eosinophils Absolute: 0.1 10*3/uL (ref 0.0–0.5)
Eosinophils Relative: 1 %
Immature Granulocytes: 1 %
Lymphocytes Relative: 6 %
Lymphs Abs: 0.7 10*3/uL (ref 0.7–4.0)
Monocytes Absolute: 1.2 10*3/uL — ABNORMAL HIGH (ref 0.1–1.0)
Monocytes Relative: 10 %
Neutro Abs: 9.9 10*3/uL — ABNORMAL HIGH (ref 1.7–7.7)
Neutrophils Relative %: 82 %

## 2020-09-17 LAB — COMPREHENSIVE METABOLIC PANEL
ALT: 105 U/L — ABNORMAL HIGH (ref 0–44)
AST: 78 U/L — ABNORMAL HIGH (ref 15–41)
Albumin: 3.1 g/dL — ABNORMAL LOW (ref 3.5–5.0)
Alkaline Phosphatase: 123 U/L (ref 38–126)
Anion gap: 14 (ref 5–15)
BUN: 56 mg/dL — ABNORMAL HIGH (ref 6–20)
CO2: 24 mmol/L (ref 22–32)
Calcium: 8.9 mg/dL (ref 8.9–10.3)
Chloride: 98 mmol/L (ref 98–111)
Creatinine, Ser: 8.21 mg/dL — ABNORMAL HIGH (ref 0.44–1.00)
GFR, Estimated: 6 mL/min — ABNORMAL LOW (ref >60)
Glucose, Bld: 170 mg/dL — ABNORMAL HIGH (ref 70–99)
Potassium: 5.3 mmol/L — ABNORMAL HIGH (ref 3.5–5.1)
Sodium: 136 mmol/L (ref 135–145)
Total Bilirubin: 0.9 mg/dL (ref 0.3–1.2)
Total Protein: 5.7 g/dL — ABNORMAL LOW (ref 6.5–8.1)

## 2020-09-17 LAB — RESPIRATORY PANEL BY RT PCR (FLU A&B, COVID)
Influenza A by PCR: NEGATIVE
Influenza B by PCR: NEGATIVE
SARS Coronavirus 2 by RT PCR: NEGATIVE

## 2020-09-17 LAB — CBC
HCT: 35.1 % — ABNORMAL LOW (ref 36.0–46.0)
Hemoglobin: 10.9 g/dL — ABNORMAL LOW (ref 12.0–15.0)
MCH: 28.5 pg (ref 26.0–34.0)
MCHC: 31.1 g/dL (ref 30.0–36.0)
MCV: 91.9 fL (ref 80.0–100.0)
Platelets: 78 10*3/uL — ABNORMAL LOW (ref 150–400)
RBC: 3.82 MIL/uL — ABNORMAL LOW (ref 3.87–5.11)
RDW: 19.3 % — ABNORMAL HIGH (ref 11.5–15.5)
WBC: 12.1 10*3/uL — ABNORMAL HIGH (ref 4.0–10.5)
nRBC: 0.2 % (ref 0.0–0.2)

## 2020-09-17 LAB — I-STAT CHEM 8, ED
BUN: 66 mg/dL — ABNORMAL HIGH (ref 6–20)
Calcium, Ion: 1.1 mmol/L — ABNORMAL LOW (ref 1.15–1.40)
Chloride: 97 mmol/L — ABNORMAL LOW (ref 98–111)
Creatinine, Ser: 8.6 mg/dL — ABNORMAL HIGH (ref 0.44–1.00)
Glucose, Bld: 166 mg/dL — ABNORMAL HIGH (ref 70–99)
HCT: 37 % (ref 36.0–46.0)
Hemoglobin: 12.6 g/dL (ref 12.0–15.0)
Potassium: 4.9 mmol/L (ref 3.5–5.1)
Sodium: 134 mmol/L — ABNORMAL LOW (ref 135–145)
TCO2: 28 mmol/L (ref 22–32)

## 2020-09-17 LAB — SAVE SMEAR(SSMR), FOR PROVIDER SLIDE REVIEW

## 2020-09-17 LAB — ETHANOL: Alcohol, Ethyl (B): <10 mg/dL (ref <10)

## 2020-09-17 LAB — PROTIME-INR
INR: 1 (ref 0.8–1.2)
Prothrombin Time: 12.9 seconds (ref 11.4–15.2)

## 2020-09-17 LAB — APTT: aPTT: 28 seconds (ref 24–36)

## 2020-09-17 LAB — I-STAT BETA HCG BLOOD, ED (MC, WL, AP ONLY): I-stat hCG, quantitative: <5 m[IU]/mL (ref <5)

## 2020-09-17 MED ORDER — ASPIRIN 325 MG PO TABS: 325.0000 mg | ORAL_TABLET | Freq: Every day | ORAL | Status: DC

## 2020-09-17 MED ORDER — CALCIUM ACETATE (PHOS BINDER) 667 MG/5ML PO SOLN
1334.0000 mg | Freq: Three times a day (TID) | ORAL | Status: DC
  Administered 2020-09-18 – 2020-09-23 (×13): 1334 mg via ORAL
  Filled 2020-09-17 (×20): qty 10

## 2020-09-17 MED ORDER — PENTAFLUOROPROP-TETRAFLUOROETH EX AERO: 1.0000 "application " | INHALATION_SPRAY | CUTANEOUS | Status: DC | PRN

## 2020-09-17 MED ORDER — ACETAMINOPHEN 650 MG RE SUPP: 650.0000 mg | Freq: Four times a day (QID) | RECTAL | Status: DC | PRN

## 2020-09-17 MED ORDER — ASPIRIN 325 MG PO TABS
325.0000 mg | ORAL_TABLET | Freq: Once | ORAL | Status: AC
  Administered 2020-09-18: 325 mg via ORAL
  Filled 2020-09-17: qty 1

## 2020-09-17 MED ORDER — MECLIZINE HCL 25 MG PO TABS
25.0000 mg | ORAL_TABLET | Freq: Once | ORAL | Status: AC
  Administered 2020-09-17: 25 mg via ORAL
  Filled 2020-09-17: qty 1

## 2020-09-17 MED ORDER — POLYVINYL ALCOHOL 1.4 % OP SOLN
2.0000 [drp] | Freq: Every day | OPHTHALMIC | Status: DC | PRN
  Filled 2020-09-17: qty 15

## 2020-09-17 MED ORDER — PAROXETINE HCL 20 MG PO TABS
40.0000 mg | ORAL_TABLET | Freq: Every day | ORAL | Status: DC
  Administered 2020-09-18 – 2020-09-23 (×7): 40 mg via ORAL
  Filled 2020-09-17 (×7): qty 2

## 2020-09-17 MED ORDER — ALTEPLASE 2 MG IJ SOLR: 2.0000 mg | Freq: Once | INTRAMUSCULAR | Status: DC | PRN

## 2020-09-17 MED ORDER — PREDNISONE 5 MG PO TABS
10.0000 mg | ORAL_TABLET | Freq: Every day | ORAL | Status: DC
  Administered 2020-09-18 – 2020-09-22 (×5): 10 mg via ORAL
  Filled 2020-09-17 (×5): qty 2

## 2020-09-17 MED ORDER — CHLORHEXIDINE GLUCONATE CLOTH 2 % EX PADS
6.0000 | MEDICATED_PAD | Freq: Every day | CUTANEOUS | Status: DC
  Administered 2020-09-18 – 2020-09-23 (×5): 6 via TOPICAL

## 2020-09-17 MED ORDER — ATORVASTATIN CALCIUM 40 MG PO TABS
40.0000 mg | ORAL_TABLET | Freq: Every day | ORAL | Status: DC
  Administered 2020-09-18 – 2020-09-23 (×7): 40 mg via ORAL
  Filled 2020-09-17 (×7): qty 1

## 2020-09-17 MED ORDER — HYDRALAZINE HCL 25 MG PO TABS: 25.0000 mg | ORAL_TABLET | Freq: Two times a day (BID) | ORAL | Status: DC

## 2020-09-17 MED ORDER — SODIUM CHLORIDE 0.9 % IV SOLN: 100.0000 mL | INTRAVENOUS | Status: DC | PRN

## 2020-09-17 MED ORDER — ACETAMINOPHEN 325 MG PO TABS: 650.0000 mg | ORAL_TABLET | Freq: Four times a day (QID) | ORAL | Status: DC | PRN

## 2020-09-17 MED ORDER — HEPARIN SODIUM (PORCINE) 1000 UNIT/ML IJ SOLN
INTRAMUSCULAR | Status: AC
  Filled 2020-09-17: qty 4

## 2020-09-17 MED ORDER — TRAZODONE HCL 100 MG PO TABS
100.0000 mg | ORAL_TABLET | Freq: Every day | ORAL | Status: DC
  Administered 2020-09-18 – 2020-09-22 (×6): 100 mg via ORAL
  Filled 2020-09-17 (×6): qty 1

## 2020-09-17 MED ORDER — DILTIAZEM HCL ER BEADS 240 MG PO CP24: 240.0000 mg | ORAL_CAPSULE | Freq: Every day | ORAL | Status: DC

## 2020-09-17 MED ORDER — HEPARIN SODIUM (PORCINE) 1000 UNIT/ML DIALYSIS: 1000.0000 [IU] | INTRAMUSCULAR | Status: DC | PRN

## 2020-09-17 MED ORDER — HEPARIN SODIUM (PORCINE) 1000 UNIT/ML DIALYSIS: 20.0000 [IU]/kg | INTRAMUSCULAR | Status: DC | PRN

## 2020-09-17 MED ORDER — PANTOPRAZOLE SODIUM 40 MG PO TBEC
40.0000 mg | DELAYED_RELEASE_TABLET | Freq: Every day | ORAL | Status: DC
  Administered 2020-09-18 – 2020-09-23 (×6): 40 mg via ORAL
  Filled 2020-09-17 (×6): qty 1

## 2020-09-17 MED ORDER — LIDOCAINE-PRILOCAINE 2.5-2.5 % EX CREA: 1.0000 "application " | TOPICAL_CREAM | CUTANEOUS | Status: DC | PRN

## 2020-09-17 MED ORDER — LIDOCAINE HCL (PF) 1 % IJ SOLN: 5.0000 mL | INTRAMUSCULAR | Status: DC | PRN

## 2020-09-17 NOTE — ED Provider Notes (Signed)
[place] EMERGENCY DEPARTMENT Provider Note   CSN: [id number] Arrival date & time: 09/17/20  6295     History Chief Complaint  Patient presents with  . Emesis  . Diarrhea  . Dizziness    [name] is a 44 y.o. female with PMH significant for ESRD on HD T/TH/SA, HTN, HLD, and lupus who presents the ED with complaints of room spinning dizziness, diarrhea, and nausea symptoms.    I reviewed patient's medical record and she was evaluated at [facility] last evening for same symptoms.  Comprehensive work-up was performed and she was diagnosed with likely vertigo.  She was scheduled for an outpatient MRI to be obtained 09/19/2020 and prescribed meclizine and Zofran in interim for her symptoms.  She was also encouraged to take Plavix until MRI is performed.  The note was signed just two hours prior to my initial examination as patient came straight here from [facility] ED.  Her troponins were mildly elevated, however given her ESRD on HD, lower suspicion for cardiac etiology.  No prior history of CVA.  Lateral nystagmus and dizziness worse with movement.  Troponin from 92-103.  Transaminitis.  Leukocytosis to 16.7.  CT head without contrast demonstrated subacute to chronic infarct of posterior left frontal lobe.  MRI may be helpful for further characterization.  Patient was treated with Zofran and Phenergan for her nausea symptoms.  Plan at discharge was for her to proceed with regular dialysis, as scheduled.  On my examination, patient is drowsy.  She tells me that her room spinning dizziness is constant rather than paroxysmal/intermittent.  She does endorse however that it is worse with certain movements such as standing up out of bed.  However, she continues to close her eyes and doze off during my examination.  Her sister informs me that that behavior is unusual for her, but not surprising given that she spent her entire night in the ER at [facility].  She tells  me that her symptoms of room spinning dizziness began at approximately 10 PM last night, shortly after crawling into bed to go to sleep.  It resulted in nausea and multiple episodes of emesis.  She went straight to the ER last night where she remained from approximately 1 AM until 6 AM.  When she was discharged home, she felt dissatisfied with the care that she had received as she still could not ambulate due to her profound ataxia.  She returns to the [facility] emergency department given concern for stroke.  She is now approximately 12 hours since symptom onset.  HPI     Past Medical History:  Diagnosis Date  . Allergy   . Anemia   . Anxiety   . Arthritis   . Avascular necrosis of bone (HCC)    left tibial talus due to chronic prednisone use  . Depression   . Dyspnea    too much  fluid  . ESRD (end stage renal disease) on dialysis ([place])    tthsat [place]  . GERD (gastroesophageal reflux disease)   . Headache(784.0)   . History of high blood pressure   . Hyperlipemia   . Hypertension   . Lupus ([place])   . Pneumonia   . Secondary hyperparathyroidism (of renal origin)     Patient Active Problem List   Diagnosis Date Noted  . CAP (community acquired pneumonia) due to Chlamydia species 06/08/2020  . AV fistula occlusion ([place]) 03/16/2020  . AV fistula occlusion, initial encounter ([place])  03/14/2020  . Hyperkalemia   . Hematoma of arm, left, initial encounter 09/25/2019  . AV graft malfunction ([place]) 09/24/2019  . Mild dysplasia of cervix (CIN I) bx provern 10/2018 11/26/2018  . Bilateral lower extremity edema 10/22/2018  . Pre-transplant evaluation for kidney transplant 08/22/2018  . Anxiety and depression 04/16/2018  . Difficulty sleeping 04/16/2018  . Heart palpitations 11/20/2017  . History of duodenal ulcer 10/21/2017  . Metabolic disorder 16/08/9603  . Osteopathy in diseases classified elsewhere, unspecified site 09/20/2017  . GERD (gastroesophageal reflux disease)  07/10/2016  . Acute blood loss anemia 03/17/2016  . Thrombocytopenia ([place]) 03/17/2016  . Duodenal ulcer hemorrhagic   . Esophageal stricture   . Adjustment disorder with mixed anxiety and depressed mood 10/25/2015  . Lupus ([place]) 04/29/2015  . ESRD on dialysis ([place]) 10/09/2013  . Anemia in chronic kidney disease 10/09/2013  . Secondary renal hyperparathyroidism ([place]) 10/09/2013  . Essential hypertension 10/09/2013    Past Surgical History:  Procedure Laterality Date  . A/V FISTULAGRAM Left 12/02/2017   Procedure: A/V FISTULAGRAM - Left upper;  Surgeon: Angelia Mould, MD;  Location: [place] CV LAB;  Service: Cardiovascular;  Laterality: Left;  . AV FISTULA PLACEMENT Left   . AV FISTULA PLACEMENT Right 01/26/2014   Procedure: ARTERIOVENOUS (AV) FISTULA CREATION; ultrasound guided;  Surgeon: Conrad Lakeview, MD;  Location: [place];  Service: Vascular;  Laterality: Right;  . AV FISTULA PLACEMENT Right 04/25/2020   Procedure: RIGHT UPPER ARM  BASILIC VEIN ARTERIOVENOUS (AV) FISTULA;  Surgeon: [name], MD;  Location: [place];  Service: Vascular;  Laterality: Right;  . BASCILIC VEIN TRANSPOSITION Right 07/01/2020   Procedure: SECOND STAGE BASCILIC VEIN TRANSPOSITION RIGHT UPPER EXTREMITY;  Surgeon: [name], MD;  Location: [facility] OR;  Service: Vascular;  Laterality: Right;  . ESOPHAGOGASTRODUODENOSCOPY N/A 03/17/2016   Procedure: ESOPHAGOGASTRODUODENOSCOPY (EGD);  Surgeon: Irene Shipper, MD;  Location: [facility] ENDOSCOPY;  Service: Endoscopy;  Laterality: N/A;  . FISTULOGRAM Left 09/24/2019   Procedure: FISTULOGRAM LEFT ARM;  Surgeon: [name], MD;  Location: [place];  Service: Vascular;  Laterality: Left;  . HEMATOMA EVACUATION Left 10/09/2013   Procedure: EVACUATION HEMATOMA;  Surgeon: Angelia Mould, MD;  Location: [place];  Service: Vascular;  Laterality: Left;  . HEMATOMA EVACUATION Left 09/24/2019   Procedure: EVACUATION HEMATOMA  LEFT ARM;  Surgeon: [name], MD;  Location: [place];  Service: Vascular;  Laterality: Left;  . INCISION AND DRAINAGE ABSCESS     gluteal  . INSERTION OF DIALYSIS CATHETER N/A 10/09/2013   Procedure: INSERTION OF DIALYSIS CATHETER; ULTRASOUND GUIDED;  Surgeon: Angelia Mould, MD;  Location: [place];  Service: Vascular;  Laterality: N/A;  . INSERTION OF DIALYSIS CATHETER Left 09/24/2019   Procedure: INSERTION OF TUNNELLED DIALYSIS CATHETER - PALINDROME 15FR X 28CM;  Surgeon: [name], MD;  Location: [place];  Service: Vascular;  Laterality: Left;  . INSERTION OF DIALYSIS CATHETER Right 03/15/2020   Procedure: Insertion Of Dialysis Catheter;  Surgeon: Angelia Mould, MD;  Location: [place];  Service: Cardiovascular;  Laterality: Right;  . PERIPHERAL VASCULAR BALLOON ANGIOPLASTY Left 12/02/2017   Procedure: PERIPHERAL VASCULAR BALLOON ANGIOPLASTY;  Surgeon: Angelia Mould, MD;  Location: [place] CV LAB;  Service: Cardiovascular;  Laterality: Left;  . REVISION OF ARTERIOVENOUS GORETEX GRAFT Left 04/03/2019   Procedure: REVISION OF ARTERIOVENOUS GORETEX GRAFT PSEUDOANEURYSM;  Surgeon: Angelia Mould, MD;  Location: [place];  Service: Vascular;  Laterality: Left;  .  SHUNTOGRAM N/A 01/07/2014   Procedure: fistulogram with possibe venoplasty left upper arm avg;  Surgeon: Angelia Mould, MD;  Location: [facility] CATH LAB;  Service: Cardiovascular;  Laterality: N/A;  . THROMBECTOMY AND REVISION OF ARTERIOVENTOUS (AV) GORETEX  GRAFT Left 03/15/2020   Procedure: ATTEMPTED THROMBECTOMY OF LEFT ARM ARTERIOVENTOUS (AV) GORETEX  GRAFT;  Surgeon: Angelia Mould, MD;  Location: [place];  Service: Cardiovascular;  Laterality: Left;  . tibiocalcaneal fusion  left Left   . ULTRASOUND GUIDANCE FOR VASCULAR ACCESS  09/24/2019   Procedure: Ultrasound Guidance For Vascular Access;  Surgeon: [name], MD;  Location: [facility] OR;  Service: Vascular;;     OB History    Gravida  0   Para  0     Term  0   Preterm  0   AB  0   Living  0     SAB  0   TAB  0   Ectopic  0   Multiple  0   Live Births  0           Family History  Problem Relation Age of Onset  . Asthma Sister   . Hypertension Mother   . Heart attack Mother 24       died at 50  . COPD Maternal Uncle        prostate  . Mental illness Neg Hx     Social History   Tobacco Use  . Smoking status: Never Smoker  . Smokeless tobacco: Never Used  Vaping Use  . Vaping Use: Never used  Substance Use Topics  . Alcohol use: No  . Drug use: No    Home Medications Prior to Admission medications   Medication Sig Start Date End Date Taking? Authorizing Provider  acetaminophen (TYLENOL) 500 MG tablet Take 1,000 mg by mouth every 6 (six) hours as needed for moderate pain or headache.     [provider]  calcium acetate, Phos Binder, (PHOSLYRA) 667 MG/5ML SOLN Take 2,001 mg by mouth See admin instructions. Take 2001 mg with each meal and snack    [provider]  Carboxymethylcellul-Glycerin (LUBRICATING EYE DROPS OP) Place 1 drop into both eyes daily as needed (dry eyes).    [provider]  diltiazem (TIAZAC) 240 MG 24 hr capsule Take 240 mg by mouth at bedtime.     [provider]  hydrALAZINE (APRESOLINE) 25 MG tablet Take 25 mg by mouth in the morning and at bedtime.    [provider]  HYDROcodone-acetaminophen (NORCO/VICODIN) 5-325 MG tablet Take 1 tablet by mouth every 6 (six) hours as needed for moderate pain. Patient not taking: Reported on 09/14/2020 07/01/20 07/01/21  Ulyses Amor, PA-C  pantoprazole (PROTONIX) 40 MG tablet Take 1 tablet (40 mg total) by mouth daily. 12/04/17   Hoyt Koch, MD  PARoxetine (PAXIL) 40 MG tablet Take 40 mg by mouth daily.    [provider]  predniSONE (DELTASONE) 10 MG tablet Take 1 tablet (10 mg total) by mouth daily with breakfast. 08/29/17   Hoyt Koch, MD  traZODone (DESYREL) 100 MG  tablet Take 100 mg by mouth at bedtime.    [provider]    Allergies    Amlodipine, Sulfa antibiotics, Vancomycin, and Venlafaxine  Review of Systems   Review of Systems  All other systems reviewed and are negative.   Physical Exam Updated Vital Signs BP (!) 141/98 (BP Location: Right Arm)   Pulse 73  Temp 98.1 F (36.7 C) (Oral)   Resp 16   Ht 5\' 4"  (1.626 m)   Wt 60.3 kg   LMP 08/10/2020   SpO2 98%   BMI 22.83 kg/m   Physical Exam Vitals and nursing note reviewed. Exam conducted with a chaperone present.  Constitutional:      Appearance: Normal appearance.  HENT:     Head: Normocephalic and atraumatic.  Eyes:     General: No scleral icterus.    Conjunctiva/sclera: Conjunctivae normal.  Pulmonary:     Effort: Pulmonary effort is normal.  Skin:    General: Skin is dry.  Neurological:     Mental Status: She is alert.     GCS: GCS eye subscore is 4. GCS verbal subscore is 5. GCS motor subscore is 6.  Psychiatric:        Mood and Affect: Mood normal.        Behavior: Behavior normal.        Thought Content: Thought content normal.     ED Results / Procedures / Treatments   Labs (all labs ordered are listed, but only abnormal results are displayed) Labs Reviewed  CBC - Abnormal; Notable for the following components:      Result Value   WBC 12.1 (*)    RBC 3.82 (*)    Hemoglobin 10.9 (*)    HCT 35.1 (*)    RDW 19.3 (*)    Platelets 78 (*)    All other components within normal limits  DIFFERENTIAL - Abnormal; Notable for the following components:   Neutro Abs 9.9 (*)    Monocytes Absolute 1.2 (*)    Abs Immature Granulocytes 0.17 (*)    All other components within normal limits  COMPREHENSIVE METABOLIC PANEL - Abnormal; Notable for the following components:   Potassium 5.3 (*)    Glucose, Bld 170 (*)    BUN 56 (*)    Creatinine, Ser 8.21 (*)    Total Protein 5.7 (*)    Albumin 3.1 (*)    AST 78 (*)    ALT 105 (*)    GFR, Estimated 6  (*)    All other components within normal limits  I-STAT CHEM 8, ED - Abnormal; Notable for the following components:   Sodium 134 (*)    Chloride 97 (*)    BUN 66 (*)    Creatinine, Ser 8.60 (*)    Glucose, Bld 166 (*)    Calcium, Ion 1.10 (*)    All other components within normal limits  RESPIRATORY PANEL BY RT PCR (FLU A&B, COVID)  ETHANOL  PROTIME-INR  APTT  RAPID URINE DRUG SCREEN, HOSP PERFORMED  URINALYSIS, ROUTINE W REFLEX MICROSCOPIC  I-STAT BETA HCG BLOOD, ED ([facility], [facility], [facility] ONLY)    EKG EKG Interpretation  Date/Time:  Saturday September 17 2020 11:00:32 EDT Ventricular Rate:  81 PR Interval:    QRS Duration: 138 QT Interval:  440 QTC Calculation: 511 R Axis:   44 Text Interpretation: Sinus rhythm Probable left atrial enlargement Right bundle branch block Baseline wander in lead(s) V4 Confirmed by Pattricia Boss [phone]) on 09/17/2020 11:11:58 AM   Radiology MR BRAIN WO CONTRAST  Result Date: 09/17/2020 CLINICAL DATA:  Dizziness EXAM: MRI HEAD WITHOUT CONTRAST TECHNIQUE: Multiplanar, multiecho pulse sequences of the brain and surrounding structures were obtained without intravenous contrast. COMPARISON:  None. FINDINGS: Motion artifact is present. Brain: There is an 8 mm focus of mildly reduced diffusion involving the midline inferior vermis. Chronic left  parietal infarct with chronic blood products. There is no intracranial mass, mass effect, or edema. There is no hydrocephalus or extra-axial fluid collection. Ventricles and sulci are normal in size and configuration. Vascular: Major vessel flow voids at the skull base are preserved. Skull and upper cervical spine: Normal marrow signal is preserved. Sinuses/Orbits: Paranasal sinuses are aerated. Orbits are unremarkable. Other: Sella is unremarkable.  Mastoid air cells are clear. IMPRESSION: Motion degraded. Subcentimeter acute infarct involving the midline inferior vermis. Chronic left parietal infarct. Electronically Signed    By: Macy Mis M.D.   On: 09/17/2020 13:59    Procedures .Critical Care Performed by: Corena Herter, PA-C Authorized by: Corena Herter, PA-C   Critical care provider statement:    Critical care time (minutes):  60   Critical care was necessary to treat or prevent imminent or life-threatening deterioration of the following conditions:  CNS failure or compromise   Critical care was time spent personally by me on the following activities:  Discussions with consultants, evaluation of patient's response to treatment, examination of patient, ordering and performing treatments and interventions, ordering and review of laboratory studies, ordering and review of radiographic studies, pulse oximetry, re-evaluation of patient's condition, obtaining history from patient or surrogate and review of old charts Comments:     Acute stroke   (including critical care time)  Medications Ordered in ED Medications  meclizine (ANTIVERT) tablet 25 mg (25 mg Oral Given 09/17/20 0916)    ED Course  I have reviewed the triage vital signs and the nursing notes.  Pertinent labs & imaging results that were available during my care of the patient were reviewed by me and considered in my medical decision making (see chart for details).  Clinical Course as of Sep 18 1439  Sat Sep 17, 2020  0948 I spoke with Dr. Leonel Ramsay.  Will obtain MRI brain without contrast given ESRD.  We will also repeat basic laboratory work-up given that it had been obtained at [facility].  Will reach out to Dr. Leonel Ramsay with results of MRI to determine neck steps.   [GG]  1249 I spoke with Dr. Leonel Ramsay who personally evaluated the patient.  He also expresses concern for central etiology.  Will await MRI findings.  If negative for an acute stroke, he states that she will need to be admitted to hospitalist services for labyrinthitis and vestibular rehabilitation.   [GG]  [id number]  Spoke with hospitalist who will consult  with nephrology re: dialysis and have her admitted for stroke work-up.  Asked that I add on repeat COVID-19 testing given that her one [place] [facility] was at separate facility.     [GG]    Clinical Course User Index [GG] Corena Herter, PA-C   MDM Rules/Calculators/A&P                          I spoke with Dr. Leonel Ramsay.  Will obtain MRI brain without contrast given ESRD.  We will also repeat basic laboratory work-up given that it had been obtained at [facility].  Will reach out to Dr. Leonel Ramsay with results of MRI to determine neck steps.  On subsequent evaluation, patient states that the symptoms of dizziness has not improved with meclizine.  She continues to endorse intractable spinning sensation.  I called down to MRI who will move her up to grab her for transport.    I spoke with Dr. Leonel Ramsay who personally evaluated the patient.  He  also expresses concern for central etiology.  Will await MRI findings.  If negative for an acute stroke, he states that she will need to be admitted to hospitalist services for labyrinthitis and vestibular rehabilitation.  MRI brain without contrast reveals a subcentimeter acute infarct involving the midline inferior vermis.    Spoke with hospitalist who will consult with nephrology re: dialysis and have her admitted for stroke work-up.  Asked that I add on repeat COVID-19 testing given that her one [place] [facility] was at separate facility.    Final Clinical Impression(s) / ED Diagnoses Final diagnoses:  Cerebellar stroke [facility])    Rx / DC Orders ED Discharge Orders    None       Corena Herter, PA-C 09/17/20 1440    Isla Pence, MD 09/17/20 1529

## 2020-09-17 NOTE — ED Notes (Signed)
Pt complaining of nausea.   

## 2020-09-17 NOTE — H&P (Addendum)
[facility] Admission History and Physical Service Pager: [phone]  Patient name: [name] Medical record number: [id number] Date of birth: [birth year] Age: 44 y.o. Gender: female  Primary Care Provider: Leeanne Rio, MD Consultants: neurology, nephrology Code Status: Full Preferred Emergency Contact: Danny Lawless [phone]  Chief Complaint: Dizziness with vomiting and bowel incontinence   Assessment and Plan: [name] is a 44 y.o. female presenting with dizziness accompanied with vomiting and one episode of diarrheal incontinence . PMH is significant for lupus, ESRD, HTN, and HLD.  CVA  Pt suddenly felt dizzy while lying in bed last night around 11 pm. This was accompanied by 4 episodes of vomiting and 1 episode of diarrheal incontinence. She went to [facility] and was diagnosed with vertigo, prescribed meclizine, scheduled for an outpatient MRI for 09/19/20 and was discharged. Her cousin picked her up and immediately brought her to [facility] ED.  Pt is still constantly dizzy and feels like the room is spinning around her counter-clockwise and she leans to the left when sitting up. She is oriented x4. MR Brain shows a subcentimeter acute infarct involving the midline inferior vermis and an age indeterminate infarct of the posterior left frontal lobe. Neurology evaluated patient for acute infarct. On exam, patient with profound dizziness with changes in position and lateral nystagmus. Neuro exam otherwise unremarkable. Will obtain risk stratification labs and evaluate for atherosclerosis.  -Admit to med-tele, attending Dr. Thompson Grayer -Neurology consulted, appreciate recommendations  - frequent neuro checks -SCDs for VTE prophylaxis -PT/OT/SLP eval and treat -Aspirin 325 mg, unclear why no Plavix recommended at this time -Vitals per unit routine  -HgbA1c, fasting lipid panel -Echocardiogram -Start Lipitor 40 mg  -CTA head and  neck -passed bedside swallow test -antiphospholipid syndrome testing  Lupus  Diagnoses with Lupus in 2005. Takes 10 mg prednisone. Follows with Dr. Elpidio Galea, rheumatologist but was last in 6 months. States her Lupus is in readmission.  - Continue home medications    HTN  BP on admission 139/95. Takes Ditalazem 240 mg  XR, Hydralazine 25 mg BID.  -Hold home medications    ESRD  Anemia of Chronic Disease  Lupus related ESRD.  Dialysis on T/Th/Sat . Last dialyzed on Thursday. Per chart review, pt to have fistulogram with Dr. Carlis Abbott has right IJ tunneled catheter. Home medications include Ren-a-vit 800 mg, Phos-lo.  K 5.3, Cr 8.21, BUN 56, Hgb 10.9 and MCV 91 -Nephrology consulted appreciate recommendations  -HD per nephrology -Continue home medications  - renal diet    ? Arrhythmia Pt states she was seen by a cardiologist for possible frequent APCs and wore a Holter monitor for about a week.  She states results were normal. However, she is on Diltiazem 240 mg qd and reports it is for her heart. Has seen a cardiologist with [facility] in [place]. On the monitor, patient has frequent PVCs. No chest pain or shortness of breath. ?PAC/PVC induced cardiomyopathy.  -Continue home medication  -Echocardiogram -EKG in am -TSH ordered  Thrombocytopenia Plt 78 on admission.  Baseline 110s in April 2021, appears chronic.  - blood smear  Transaminitis  AST 78 and ALT 105 on admission. Denies alcohol use or illicit drug use. Patient reports vomiting prior to admission (?viral gastritis). Hep B non-reactive in April 2021. - LFTs in AM  - Consider hepatitis panel and/or RUQ [place] if not improving  Anxiety  Home medications include Paroxetine 40 mg daily.  -Continue home medication  Insomnia  Home medications include Trazodone 100 mg nightly  -Continue at bedtime  GERD Home medications include Protonix 40 mg daily.  - Continue home medication    FEN/GI: Renal diet  Prophylaxis:  SCDs  Disposition: Admitted to med-tele  History of Present Illness:  [name] is a 44 y.o. female presenting with dizziness and admitted for acute CVA. She reports suddenly feeling sick around 11 pm while lying in bed and the room was "spinning". She denies pain. Cousin reports pt was vomiting (x4) and had 1 episode of diarrheal incontinence last night during this episode of dizziness. Niece called EMS and was transported to [facility] where she was diagnosed with vertigo, prescribed meclizine, scheduled for an outpatient MRI for 09/19/20 and was discharged. This morning, her cousin picked her up from [facility] and drove her to [facility] as pt continued to have dizziness and had felt jittery.  She did not take the meclizine. She feels like her body turns with the room and describes the room to be spinning to the left. Reports dizziness in the past which passed with breathing and drinking water. Dizziness occurs about once a month but last was about 2 months ago. Dizziness persists and was worse when she moves quickly.  Denies chest pain, shortness of breath, vision changes (blurry or spots), recent falls, and tinnitus. She has numbness and tingling in her felt with dialysis but usually stops. Wore heart monitor previously for about a week as her "heart was skipping." States results where normal ([facility] [place]). She reports she takes Diltiazem for her heart. Patient walks with a cane.    Review Of Systems: Per HPI with the following additions:   Review of Systems  Constitutional: Negative for fever.  HENT: Negative for tinnitus.   Eyes: Negative for blurred vision.       Vision changes  Respiratory: Negative for shortness of breath.   Cardiovascular: Negative for chest pain.  Gastrointestinal: Positive for diarrhea, nausea and vomiting.  Genitourinary:       ESRD does not make urine  Musculoskeletal: Negative for falls.  Neurological: Positive for dizziness and tingling  (both feet). Negative for loss of consciousness.    Patient Active Problem List   Diagnosis Date Noted  . CAP (community acquired pneumonia) due to Chlamydia species 06/08/2020  . 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 AV fistula occlusion, initial encounter ([place]) 03/14/2020  . Hyperkalemia   . Hematoma of arm, left, initial encounter 09/25/2019  . AV graft malfunction ([place]) 09/24/2019  . Mild dysplasia of cervix (CIN I) bx provern 10/2018 11/26/2018  . Bilateral lower extremity edema 10/22/2018  . Pre-transplant evaluation for kidney transplant 08/22/2018  . Anxiety and depression 04/16/2018  . Difficulty sleeping 04/16/2018  . Heart palpitations 11/20/2017  . History of duodenal ulcer 10/21/2017  . Metabolic disorder 25/03/3975  . Osteopathy in diseases classified elsewhere, unspecified site 09/20/2017  . GERD (gastroesophageal reflux disease) 07/10/2016  . Acute blood loss anemia 03/17/2016  . Thrombocytopenia ([place]) 03/17/2016  . Duodenal ulcer hemorrhagic   . Esophageal stricture   . Adjustment disorder with mixed anxiety and depressed mood 10/25/2015  . Lupus ([place]) 04/29/2015  . ESRD on dialysis ([place]) 10/09/2013  . Anemia in chronic kidney disease 10/09/2013  . Secondary renal hyperparathyroidism ([place]) 10/09/2013  . Essential hypertension 10/09/2013    Past Medical History: Past Medical History:  Diagnosis Date  . Allergy   . Anemia   . Anxiety   . Arthritis   .  Avascular necrosis of bone (HCC)    left tibial talus due to chronic prednisone use  . Depression   . Dyspnea    too much  fluid  . ESRD (end stage renal disease) on dialysis ([place])    [place]thsat [place]  . GERD (gastroesophageal reflux disease)   . Headache(784.0)   . History of high blood pressure   . Hyperlipemia   . Hypertension   . Lupus ([place])   . Pneumonia   . Secondary hyperparathyroidism (of renal origin)     Past Surgical History: Past Surgical History:  Procedure Laterality Date  .  A/V FISTULAGRAM Left 12/02/2017   Procedure: A/V FISTULAGRAM - Left upper;  Surgeon: Angelia Mould, MD;  Location: [place] [facility];  Service: Cardiovascular;  Laterality: Left;  . AV FISTULA PLACEMENT Left   . AV FISTULA PLACEMENT Right 01/26/2014   Procedure: ARTERIOVENOUS (AV) FISTULA CREATION; ultrasound guided;  Surgeon: Conrad Viborg, MD;  Location: [place];  Service: Vascular;  Laterality: Right;  . AV FISTULA PLACEMENT Right 04/25/2020   Procedure: RIGHT UPPER ARM  BASILIC VEIN ARTERIOVENOUS (AV) FISTULA;  Surgeon: [name], MD;  Location: [place];  Service: Vascular;  Laterality: Right;  . BASCILIC VEIN TRANSPOSITION Right 07/01/2020   Procedure: SECOND STAGE BASCILIC VEIN TRANSPOSITION RIGHT UPPER EXTREMITY;  Surgeon: [name], MD;  Location: [facility] OR;  Service: Vascular;  Laterality: Right;  . ESOPHAGOGASTRODUODENOSCOPY N/A 03/17/2016   Procedure: ESOPHAGOGASTRODUODENOSCOPY (EGD);  Surgeon: Irene Shipper, MD;  Location: [facility] ENDOSCOPY;  Service: Endoscopy;  Laterality: N/A;  . FISTULOGRAM Left 09/24/2019   Procedure: FISTULOGRAM LEFT ARM;  Surgeon: [name], MD;  Location: [place];  Service: Vascular;  Laterality: Left;  . HEMATOMA EVACUATION Left 10/09/2013   Procedure: EVACUATION HEMATOMA;  Surgeon: Angelia Mould, MD;  Location: [place];  Service: Vascular;  Laterality: Left;  . HEMATOMA EVACUATION Left 09/24/2019   Procedure: EVACUATION HEMATOMA  LEFT ARM;  Surgeon: [name], MD;  Location: [place];  Service: Vascular;  Laterality: Left;  . INCISION AND DRAINAGE ABSCESS     gluteal  . INSERTION OF DIALYSIS CATHETER N/A 10/09/2013   Procedure: INSERTION OF DIALYSIS CATHETER; ULTRASOUND GUIDED;  Surgeon: Angelia Mould, MD;  Location: [place];  Service: Vascular;  Laterality: N/A;  . INSERTION OF DIALYSIS CATHETER Left 09/24/2019   Procedure: INSERTION OF TUNNELLED DIALYSIS CATHETER - PALINDROME 15FR X 28CM;  Surgeon: [name], MD;  Location: [place];  Service: Vascular;  Laterality: Left;  . INSERTION OF DIALYSIS CATHETER Right 03/15/2020   Procedure: Insertion Of Dialysis Catheter;  Surgeon: Angelia Mould, MD;  Location: [place];  Service: Cardiovascular;  Laterality: Right;  . PERIPHERAL VASCULAR BALLOON ANGIOPLASTY Left 12/02/2017   Procedure: PERIPHERAL VASCULAR BALLOON ANGIOPLASTY;  Surgeon: Angelia Mould, MD;  Location: [place] [facility];  Service: Cardiovascular;  Laterality: Left;  . REVISION OF ARTERIOVENOUS GORETEX GRAFT Left 04/03/2019   Procedure: REVISION OF ARTERIOVENOUS GORETEX GRAFT PSEUDOANEURYSM;  Surgeon: Angelia Mould, MD;  Location: [place];  Service: Vascular;  Laterality: Left;  . SHUNTOGRAM N/A 01/07/2014   Procedure: fistulogram with possibe venoplasty left upper arm avg;  Surgeon: Angelia Mould, MD;  Location: [facility] CATH LAB;  Service: Cardiovascular;  Laterality: N/A;  . THROMBECTOMY AND REVISION OF ARTERIOVENTOUS (AV) GORETEX  GRAFT Left 03/15/2020   Procedure: ATTEMPTED THROMBECTOMY OF LEFT ARM ARTERIOVENTOUS (AV) GORETEX  GRAFT;  Surgeon: Angelia Mould, MD;  Location: [facility] OR;  Service: Cardiovascular;  Laterality: Left;  . tibiocalcaneal fusion  left Left   . ULTRASOUND GUIDANCE FOR VASCULAR ACCESS  09/24/2019   Procedure: Ultrasound Guidance For Vascular Access;  Surgeon: [name], MD;  Location: [facility] OR;  Service: Vascular;;    Social History: Social History   Tobacco Use  . Smoking status: Never Smoker  . Smokeless tobacco: Never Used  Vaping Use  . Vaping Use: Never used  Substance Use Topics  . Alcohol use: No  . Drug use: No   Additional social history:  Please also refer to relevant sections of EMR.  Family History: Family History  Problem Relation Age of Onset  . Asthma Sister   . Hypertension Mother   . Heart attack Mother 46       died at 70  . COPD Maternal Uncle        prostate  . Mental illness Neg Hx      Allergies and Medications: Allergies  Allergen Reactions  . Amlodipine Hives and Swelling  . Sulfa Antibiotics Hives and Itching  . Vancomycin Hives and Itching  . Venlafaxine Other (See Comments)    Emotional   No current facility-administered medications on file prior to encounter.   Current Outpatient Medications on File Prior to Encounter  Medication Sig Dispense Refill  . acetaminophen (TYLENOL) 500 MG tablet Take 1,000 mg by mouth every 6 (six) hours as needed for moderate pain or headache.     . calcium acetate, Phos Binder, (PHOSLYRA) 667 MG/5ML SOLN Take 2,001 mg by mouth See admin instructions. Take 2001 mg with each meal and snack    . Carboxymethylcellul-Glycerin (LUBRICATING EYE DROPS OP) Place 1 drop into both eyes daily as needed (dry eyes).    Marland Kitchen diltiazem (TIAZAC) 240 MG 24 hr capsule Take 240 mg by mouth at bedtime.     . hydrALAZINE (APRESOLINE) 25 MG tablet Take 25 mg by mouth in the morning and at bedtime.    Marland Kitchen HYDROcodone-acetaminophen (NORCO/VICODIN) 5-325 MG tablet Take 1 tablet by mouth every 6 (six) hours as needed for moderate pain. (Patient not taking: Reported on 09/14/2020) 12 tablet 0  . pantoprazole (PROTONIX) 40 MG tablet Take 1 tablet (40 mg total) by mouth daily. 30 tablet 3  . PARoxetine (PAXIL) 40 MG tablet Take 40 mg by mouth daily.    . predniSONE (DELTASONE) 10 MG tablet Take 1 tablet (10 mg total) by mouth daily with breakfast. 90 tablet 3  . traZODone (DESYREL) 100 MG tablet Take 100 mg by mouth at bedtime.      Objective: BP (!) 141/98 (BP Location: Right Arm)   Pulse 73   Temp 98.1 F (36.7 C) (Oral)   Resp 16   Ht 5\' 4"  (1.626 m)   Wt 60.3 kg   LMP 08/10/2020   SpO2 98%   BMI 22.83 kg/m  Exam: General: alert, oriented, in no acute distress Eyes: PERRLA, right lateral nystagmus, exophthalmos   ENTM: pink oral mucosa, no edema or erythema in mouth or throat  Neck: trachea midline Cardiovascular:  Murmur best heard at left upper  sternal border, RRR, normal S1 and S2. Right upper extremity fistula with good thrill, right upper chest catheter  Respiratory: CTA bilaterally, no increased work  Gastrointestinal: non tender, non distended, soft  MSK: 5/5 muscle strength in bilateral lower extremities  Derm: no rash, dry and warm  Neuro: CN 2-12 grossly intact, negative heel to shin and finger nose finger, sensation  testing normal for upper and lower extremities bilaterally, positional dizziness is apparent   Psych: affect appropriate to situation   Labs and Imaging: CBC BMET  Recent Labs  Lab 09/17/20 1303 09/17/20 1303 09/17/20 1311  WBC 12.1*  --   --   HGB 10.9*   < > 12.6  HCT 35.1*   < > 37.0  PLT 78*  --   --    < > = values in this interval not displayed.   Recent Labs  Lab 09/17/20 1303 09/17/20 1303 09/17/20 1311  NA 136   < > 134*  K 5.3*   < > 4.9  CL 98   < > 97*  CO2 24  --   --   BUN 56*   < > 66*  CREATININE 8.21*   < > 8.60*  GLUCOSE 170*   < > 166*  CALCIUM 8.9  --   --    < > = values in this interval not displayed.     EKG: heart rate 81 bpm, sinus rhythm, RBBB, normal axis    Micheline Rough, Medical Student 09/17/2020, 2:34 PM OMS-IV AI, [place] Intern pager: [phone], text pages welcome   [place] NOTE   I personally evaluated this patient along with the student, and verified all aspects of the history, physical exam, and medical decision making as documented by the student. I agree with the student's documentation and have made all necessary edits.  Lyndee Hensen, DO PGY-1, [facility] 09/17/2020 8:34 PM

## 2020-09-17 NOTE — Procedures (Signed)
Patient was seen on dialysis and the procedure was supervised.  BFR 400  Via TDC BP is  143/94.   Patient appears to be tolerating treatment well  [name] 09/17/2020

## 2020-09-17 NOTE — ED Triage Notes (Signed)
Pt. Stated, I went to hospital in [place] for diarrhea, throwing up and I got dizzy. I had everything done and they said I had vertigo or I had a stroke. So I left there and came here. Im suppose to have dialysis today.

## 2020-09-17 NOTE — ED Notes (Signed)
MRI notified that pt is ready for transport.

## 2020-09-17 NOTE — Consult Note (Addendum)
[facility] Renal Consultation Note    Indication for Consultation:  Management of ESRD/hemodialysis; anemia, hypertension/volume and secondary hyperparathyroidism   HPI: [name] is a 44 y.o. female with ESRD on HD, lupus, HTN. Dialyzes at [place]. She initially went to ED in [place] with acute onset of dizziness, she was discharged and presented to [facility] ED for further evaluation. Neurology consulted with concern for stroke. Brain MRI today showing subcentimeter acute infarct involving the midline inferior vermis. Labs Na 134, K 4.9, BUN 66 Cr 8.6, Hgb 12.6.  Due for routine dialysis today. Last dialysis was Thursday 10/28. Her clinic reports she is compliant with dialysis treatments. Using [facility] without issues. Has RUE AVF -currently resting d/t multiple infiltrations. Has been seen by [facility] and planning outpatient fistulogram.  Seen in her room, still having intermittent dizziness- no speech issues or weakness in extremities.  Says was due for an fgram this week.   Only swelling in her right arm-  Does not usually have any issues with her OP HD   Past Medical History:  Diagnosis Date  . Allergy   . Anemia   . Anxiety   . Arthritis   . Avascular necrosis of bone (HCC)    left tibial talus due to chronic prednisone use  . Depression   . Dyspnea    too much  fluid  . ESRD (end stage renal disease) on dialysis ([place])    tthsat [place]  . GERD (gastroesophageal reflux disease)   . Headache(784.0)   . History of high blood pressure   . Hyperlipemia   . Hypertension   . Lupus ([place])   . Pneumonia   . Secondary hyperparathyroidism (of renal origin)    Past Surgical History:  Procedure Laterality Date  . A/V FISTULAGRAM Left 12/02/2017   Procedure: A/V FISTULAGRAM - Left upper;  Surgeon: Angelia Mould, MD;  Location: [place] [facility];  Service: Cardiovascular;  Laterality: Left;  . AV FISTULA PLACEMENT Left   . AV FISTULA PLACEMENT Right  01/26/2014   Procedure: ARTERIOVENOUS (AV) FISTULA CREATION; ultrasound guided;  Surgeon: Conrad [place], MD;  Location: [place];  Service: Vascular;  Laterality: Right;  . AV FISTULA PLACEMENT Right 04/25/2020   Procedure: RIGHT UPPER ARM  BASILIC VEIN ARTERIOVENOUS (AV) FISTULA;  Surgeon: [name], MD;  Location: [place];  Service: Vascular;  Laterality: Right;  . BASCILIC VEIN TRANSPOSITION Right 07/01/2020   Procedure: SECOND STAGE BASCILIC VEIN TRANSPOSITION RIGHT UPPER EXTREMITY;  Surgeon: [name], MD;  Location: [facility] OR;  Service: Vascular;  Laterality: Right;  . ESOPHAGOGASTRODUODENOSCOPY N/A 03/17/2016   Procedure: ESOPHAGOGASTRODUODENOSCOPY (EGD);  Surgeon: Irene Shipper, MD;  Location: [facility] ENDOSCOPY;  Service: Endoscopy;  Laterality: N/A;  . FISTULOGRAM Left 09/24/2019   Procedure: FISTULOGRAM LEFT ARM;  Surgeon: [name], MD;  Location: [place];  Service: Vascular;  Laterality: Left;  . HEMATOMA EVACUATION Left 10/09/2013   Procedure: EVACUATION HEMATOMA;  Surgeon: Angelia Mould, MD;  Location: [place];  Service: Vascular;  Laterality: Left;  . HEMATOMA EVACUATION Left 09/24/2019   Procedure: EVACUATION HEMATOMA  LEFT ARM;  Surgeon: [name], MD;  Location: [place];  Service: Vascular;  Laterality: Left;  . INCISION AND DRAINAGE ABSCESS     gluteal  . INSERTION OF DIALYSIS CATHETER N/A 10/09/2013   Procedure: INSERTION OF DIALYSIS CATHETER; ULTRASOUND GUIDED;  Surgeon: Angelia Mould, MD;  Location: [place];  Service: Vascular;  Laterality: N/A;  .  INSERTION OF DIALYSIS CATHETER Left 09/24/2019   Procedure: INSERTION OF TUNNELLED DIALYSIS CATHETER - PALINDROME 15FR X 28CM;  Surgeon: [name], MD;  Location: [place];  Service: Vascular;  Laterality: Left;  . INSERTION OF DIALYSIS CATHETER Right 03/15/2020   Procedure: Insertion Of Dialysis Catheter;  Surgeon: Angelia Mould, MD;  Location: [place];  Service: Cardiovascular;   Laterality: Right;  . PERIPHERAL VASCULAR BALLOON ANGIOPLASTY Left 12/02/2017   Procedure: PERIPHERAL VASCULAR BALLOON ANGIOPLASTY;  Surgeon: Angelia Mould, MD;  Location: [place] [facility];  Service: Cardiovascular;  Laterality: Left;  . REVISION OF ARTERIOVENOUS GORETEX GRAFT Left 04/03/2019   Procedure: REVISION OF ARTERIOVENOUS GORETEX GRAFT PSEUDOANEURYSM;  Surgeon: Angelia Mould, MD;  Location: [place];  Service: Vascular;  Laterality: Left;  . SHUNTOGRAM N/A 01/07/2014   Procedure: fistulogram with possibe venoplasty left upper arm avg;  Surgeon: Angelia Mould, MD;  Location: [facility] CATH LAB;  Service: Cardiovascular;  Laterality: N/A;  . THROMBECTOMY AND REVISION OF ARTERIOVENTOUS (AV) GORETEX  GRAFT Left 03/15/2020   Procedure: ATTEMPTED THROMBECTOMY OF LEFT ARM ARTERIOVENTOUS (AV) GORETEX  GRAFT;  Surgeon: Angelia Mould, MD;  Location: [place];  Service: Cardiovascular;  Laterality: Left;  . tibiocalcaneal fusion  left Left   . ULTRASOUND GUIDANCE FOR VASCULAR ACCESS  09/24/2019   Procedure: Ultrasound Guidance For Vascular Access;  Surgeon: [name], MD;  Location: [facility] OR;  Service: Vascular;;   Family History  Problem Relation Age of Onset  . Asthma Sister   . Hypertension Mother   . Heart attack Mother 22       died at 70  . COPD Maternal Uncle        prostate  . Mental illness Neg Hx    Social History:  reports that she has never smoked. She has never used smokeless tobacco. She reports that she does not drink alcohol and does not use drugs. Allergies  Allergen Reactions  . Amlodipine Hives and Swelling  . Sulfa Antibiotics Hives and Itching  . Vancomycin Hives and Itching  . Venlafaxine Other (See Comments)    Emotional   Prior to Admission medications   Medication Sig Start Date End Date Taking? Authorizing Provider  acetaminophen (TYLENOL) 500 MG tablet Take 1,000 mg by mouth every 6 (six) hours as needed for moderate pain or  headache.     [provider]  calcium acetate, Phos Binder, (PHOSLYRA) 667 MG/5ML SOLN Take 2,001 mg by mouth See admin instructions. Take 2001 mg with each meal and snack    [provider]  Carboxymethylcellul-Glycerin (LUBRICATING EYE DROPS OP) Place 1 drop into both eyes daily as needed (dry eyes).    [provider]  diltiazem (TIAZAC) 240 MG 24 hr capsule Take 240 mg by mouth at bedtime.     [provider]  hydrALAZINE (APRESOLINE) 25 MG tablet Take 25 mg by mouth in the morning and at bedtime.    [provider]  HYDROcodone-acetaminophen (NORCO/VICODIN) 5-325 MG tablet Take 1 tablet by mouth every 6 (six) hours as needed for moderate pain. Patient not taking: Reported on 09/14/2020 07/01/20 07/01/21  Ulyses Amor, PA-C  pantoprazole (PROTONIX) 40 MG tablet Take 1 tablet (40 mg total) by mouth daily. 12/04/17   Hoyt Koch, MD  PARoxetine (PAXIL) 40 MG tablet Take 40 mg by mouth daily.    [provider]  predniSONE (DELTASONE) 10 MG tablet Take 1 tablet (10 mg total) by mouth daily with  breakfast. 08/29/17   Hoyt Koch, MD  traZODone (DESYREL) 100 MG tablet Take 100 mg by mouth at bedtime.    [provider]   No current facility-administered medications for this encounter.   Current Outpatient Medications  Medication Sig Dispense Refill  . acetaminophen (TYLENOL) 500 MG tablet Take 1,000 mg by mouth every 6 (six) hours as needed for moderate pain or headache.     . calcium acetate, Phos Binder, (PHOSLYRA) 667 MG/5ML SOLN Take 2,001 mg by mouth See admin instructions. Take 2001 mg with each meal and snack    . Carboxymethylcellul-Glycerin (LUBRICATING EYE DROPS OP) Place 1 drop into both eyes daily as needed (dry eyes).    Marland Kitchen diltiazem (TIAZAC) 240 MG 24 hr capsule Take 240 mg by mouth at bedtime.     . hydrALAZINE (APRESOLINE) 25 MG tablet Take 25 mg by mouth in the morning and at bedtime.    Marland Kitchen  HYDROcodone-acetaminophen (NORCO/VICODIN) 5-325 MG tablet Take 1 tablet by mouth every 6 (six) hours as needed for moderate pain. (Patient not taking: Reported on 09/14/2020) 12 tablet 0  . pantoprazole (PROTONIX) 40 MG tablet Take 1 tablet (40 mg total) by mouth daily. 30 tablet 3  . PARoxetine (PAXIL) 40 MG tablet Take 40 mg by mouth daily.    . predniSONE (DELTASONE) 10 MG tablet Take 1 tablet (10 mg total) by mouth daily with breakfast. 90 tablet 3  . traZODone (DESYREL) 100 MG tablet Take 100 mg by mouth at bedtime.       ROS: As per HPI otherwise negative.  Vitals:   09/17/20 0813 09/17/20 0817 09/17/20 0908 09/17/20 1458  BP: (!) 146/114 112/84 (!) 141/98 (!) 138/93  Pulse: 78 67 73 81  Resp: 18 18 16 13   Temp: (!) 97.4 F (36.3 C) 98.1 F (36.7 C)  (!) 97.5 F (36.4 C)  TempSrc: Oral Oral  Oral  SpO2: 99% 100% 98% 100%  Weight: 60.3 kg     Height: 5\' 4"  (1.626 m)        Physical Exam: Gen-  Pleasant BF, had episode of dizziness when I see her in the room, but it then passes HEENT-  Prominent eyes-  PERRLA, mucous membranes moist Neck- no JVD Lungs- mostly clear CV- RRR Abd- soft, non tender Ext- no peripheral LE edema-  Right arm swollen- non tender-   HD access- right sided TDC-  Also right upper arm AVF-  Thrill and bruit     Labs: Basic Metabolic Panel: Recent Labs  Lab 09/17/20 1303 09/17/20 1311  NA 136 134*  K 5.3* 4.9  CL 98 97*  CO2 24  --   GLUCOSE 170* 166*  BUN 56* 66*  CREATININE 8.21* 8.60*  CALCIUM 8.9  --    Liver Function Tests: Recent Labs  Lab 09/17/20 1303  AST 78*  ALT 105*  ALKPHOS 123  BILITOT 0.9  PROT 5.7*  ALBUMIN 3.1*   No results for input(s): LIPASE, AMYLASE in the last 168 hours. No results for input(s): AMMONIA in the last 168 hours. CBC: Recent Labs  Lab 09/17/20 1303 09/17/20 1311  WBC 12.1*  --   NEUTROABS 9.9*  --   HGB 10.9* 12.6  HCT 35.1* 37.0  MCV 91.9  --   PLT 78*  --    Cardiac Enzymes: No  results for input(s): CKTOTAL, CKMB, CKMBINDEX, TROPONINI in the last 168 hours. CBG: No results for input(s): GLUCAP in the last 168 hours. Iron Studies: No  results for input(s): IRON, TIBC, TRANSFERRIN, FERRITIN in the last 72 hours. Studies/Results: MR BRAIN WO CONTRAST  Result Date: 09/17/2020 CLINICAL DATA:  Dizziness EXAM: MRI HEAD WITHOUT CONTRAST TECHNIQUE: Multiplanar, multiecho pulse sequences of the brain and surrounding structures were obtained without intravenous contrast. COMPARISON:  None. FINDINGS: Motion artifact is present. Brain: There is an 8 mm focus of mildly reduced diffusion involving the midline inferior vermis. Chronic left parietal infarct with chronic blood products. There is no intracranial mass, mass effect, or edema. There is no hydrocephalus or extra-axial fluid collection. Ventricles and sulci are normal in size and configuration. Vascular: Major vessel flow voids at the skull base are preserved. Skull and upper cervical spine: Normal marrow signal is preserved. Sinuses/Orbits: Paranasal sinuses are aerated. Orbits are unremarkable. Other: Sella is unremarkable.  Mastoid air cells are clear. IMPRESSION: Motion degraded. Subcentimeter acute infarct involving the midline inferior vermis. Chronic left parietal infarct. Electronically Signed   By: Macy Mis M.D.   On: 09/17/2020 13:59    Dialysis Orders:  [facility] TTS 3h 400/600 2K/2.5Ca EDW 58kg TDC Hep bolus 1800 EPO 6600 q HD Venofer 50 q Mon  Assessment/Plan: 1. Dizziness/Ataxia. Stroke w/u in progress. MRI showing subcentimeter acute infarct involving the midline inferior vermis. Chronic left parietal infarct -per neuro/primary team. ASA, echo, CTA,  therapies consults 2. ESRD -  HD TTS via TDC. Will plan for HD today/tonight on schedule  3. Hypertension/volume  - BP borderline on admission/UF to EDW as tolerated.  Probably do not want to drop BP too acutely given clinical situation 4. Anemia  - Hgb 12.6  No ESA needs currently  5. Metabolic bone disease -  Ca ok. Continue home binders (phoslyra). No VDRA.  6.  SLE   Ogechi Larina Earthly PA-C [facility] 09/17/2020, 3:07 PM

## 2020-09-17 NOTE — Consult Note (Addendum)
NEURO HOSPITALIST CONSULT NOTE   Requesting physician: Dr. Gilford Raid  Reason for Consult: Vertigo  History obtained from:  Patient, chart  HPI:                                                                                                                                          [name] is an 44 y.o. female with a past medical history significant for lupus, ESRD (TThSa HD), HTN (controlled), secondary hyperparathyroidism, depression and anxiety who presents today with a ~12 hour history of room-spinning dizziness. [name] states that she has experienced dizzy spells in the past but they were self-limiting and resolved rather quickly. Her complaint today began around 11pm on 29Oct2021 which was accompanied by nausea, vomiting, stool incontinence and that it has gradually worsened. At the time of onset, she went to [facility] where they felt her dizziness was peripheral. The attending physician there provided antiemetics and diagnosed her with "classic vertigo" before discharge. Imaging as below. [name] cousin picked her up and immediately brought her to [facility] for a second opinion.   [name] denies changes in hearing, vision (no diplopia), no recent illnesses. She endorses a chronic tremor and lower extremity edema that are both normal for her. [name] has never missed a HD appointment. She is compliant with all of her medications. Cousin present at bedside.  Pertinent Medications Prednisone 10mg  QD Meclizine 25mg  once  Pertinent Imaging/Diagnostics 30Oct2021 [facility]) CT - Age indeterminate infarct of the postrior left frontal lobe, no intracranial hemorrhage  30Oct2021 [facility]) CXR - No acute cardiopulmonary disease   Past Medical History:  Diagnosis Date  . Allergy   . Anemia   . Anxiety   . Arthritis   . Avascular necrosis of bone (HCC)    left tibial talus due to chronic prednisone use  . Depression   . Dyspnea    too much  fluid  . ESRD (end stage  renal disease) on dialysis ([place])    tthsat [place]  . GERD (gastroesophageal reflux disease)   . Headache(784.0)   . History of high blood pressure   . Hyperlipemia   . Hypertension   . Lupus ([place])   . Pneumonia   . Secondary hyperparathyroidism (of renal origin)     Past Surgical History:  Procedure Laterality Date  . A/V FISTULAGRAM Left 12/02/2017   Procedure: A/V FISTULAGRAM - Left upper;  Surgeon: Angelia Mould, MD;  Location: [place] CV LAB;  Service: Cardiovascular;  Laterality: Left;  . AV FISTULA PLACEMENT Left   . AV FISTULA PLACEMENT Right 01/26/2014   Procedure: ARTERIOVENOUS (AV) FISTULA CREATION; ultrasound guided;  Surgeon: Conrad Salesville, MD;  Location: [place];  Service: Vascular;  Laterality: Right;  . AV FISTULA PLACEMENT Right 04/25/2020  Procedure: RIGHT UPPER ARM  BASILIC VEIN ARTERIOVENOUS (AV) FISTULA;  Surgeon: [name], MD;  Location: [place];  Service: Vascular;  Laterality: Right;  . BASCILIC VEIN TRANSPOSITION Right 07/01/2020   Procedure: SECOND STAGE BASCILIC VEIN TRANSPOSITION RIGHT UPPER EXTREMITY;  Surgeon: [name], MD;  Location: [facility] OR;  Service: Vascular;  Laterality: Right;  . ESOPHAGOGASTRODUODENOSCOPY N/A 03/17/2016   Procedure: ESOPHAGOGASTRODUODENOSCOPY (EGD);  Surgeon: Irene Shipper, MD;  Location: [facility] ENDOSCOPY;  Service: Endoscopy;  Laterality: N/A;  . FISTULOGRAM Left 09/24/2019   Procedure: FISTULOGRAM LEFT ARM;  Surgeon: [name], MD;  Location: [place];  Service: Vascular;  Laterality: Left;  . HEMATOMA EVACUATION Left 10/09/2013   Procedure: EVACUATION HEMATOMA;  Surgeon: Angelia Mould, MD;  Location: [place];  Service: Vascular;  Laterality: Left;  . HEMATOMA EVACUATION Left 09/24/2019   Procedure: EVACUATION HEMATOMA  LEFT ARM;  Surgeon: [name], MD;  Location: [place];  Service: Vascular;  Laterality: Left;  . INCISION AND DRAINAGE ABSCESS     gluteal  . INSERTION OF DIALYSIS  CATHETER N/A 10/09/2013   Procedure: INSERTION OF DIALYSIS CATHETER; ULTRASOUND GUIDED;  Surgeon: Angelia Mould, MD;  Location: [place];  Service: Vascular;  Laterality: N/A;  . INSERTION OF DIALYSIS CATHETER Left 09/24/2019   Procedure: INSERTION OF TUNNELLED DIALYSIS CATHETER - PALINDROME 15FR X 28CM;  Surgeon: [name], MD;  Location: [place];  Service: Vascular;  Laterality: Left;  . INSERTION OF DIALYSIS CATHETER Right 03/15/2020   Procedure: Insertion Of Dialysis Catheter;  Surgeon: Angelia Mould, MD;  Location: [place];  Service: Cardiovascular;  Laterality: Right;  . PERIPHERAL VASCULAR BALLOON ANGIOPLASTY Left 12/02/2017   Procedure: PERIPHERAL VASCULAR BALLOON ANGIOPLASTY;  Surgeon: Angelia Mould, MD;  Location: [place] CV LAB;  Service: Cardiovascular;  Laterality: Left;  . REVISION OF ARTERIOVENOUS GORETEX GRAFT Left 04/03/2019   Procedure: REVISION OF ARTERIOVENOUS GORETEX GRAFT PSEUDOANEURYSM;  Surgeon: Angelia Mould, MD;  Location: [place];  Service: Vascular;  Laterality: Left;  . SHUNTOGRAM N/A 01/07/2014   Procedure: fistulogram with possibe venoplasty left upper arm avg;  Surgeon: Angelia Mould, MD;  Location: [facility] CATH LAB;  Service: Cardiovascular;  Laterality: N/A;  . THROMBECTOMY AND REVISION OF ARTERIOVENTOUS (AV) GORETEX  GRAFT Left 03/15/2020   Procedure: ATTEMPTED THROMBECTOMY OF LEFT ARM ARTERIOVENTOUS (AV) GORETEX  GRAFT;  Surgeon: Angelia Mould, MD;  Location: [place];  Service: Cardiovascular;  Laterality: Left;  . tibiocalcaneal fusion  left Left   . ULTRASOUND GUIDANCE FOR VASCULAR ACCESS  09/24/2019   Procedure: Ultrasound Guidance For Vascular Access;  Surgeon: [name], MD;  Location: [facility] OR;  Service: Vascular;;    Family History  Problem Relation Age of Onset  . Asthma Sister   . Hypertension Mother   . Heart attack Mother 39       died at 62  . COPD Maternal Uncle        prostate  . Mental  illness Neg Hx     Social History:  reports that she has never smoked. She has never used smokeless tobacco. She reports that she does not drink alcohol and does not use drugs.  Allergies  Allergen Reactions  . Amlodipine Hives and Swelling  . Sulfa Antibiotics Hives and Itching  . Vancomycin Hives and Itching  . Venlafaxine Other (See Comments)    Emotional    MEDICATIONS:  No outpatient medications have been marked as taking for the 09/17/20 encounter [facility] Encounter).     Review Of Systems:                                                                                                           History obtained from the patient  General: Negative for chills, fever Psychological: Positive for depression and anxiety, medicated without concern Ophthalmic: Negative for double vision ENT: Negative for  abrupt loss of hearing, tinnitus. Positive vertigo Respiratory: Negative for cough, shortness of breath Cardiovascular: Negative for chest pain, slight LL edema, L>R (normal for her) Gastrointestinal: Currently no nausea, vomiting. See HPI. Musculoskeletal: Negative for muscular weakness Neurological: As noted in HPI Dermatological: Negative for rash or skin changes, numbness or tingling  Blood pressure (!) 141/98, pulse 73, temperature 98.1 F (36.7 C), temperature source Oral, resp. rate 16, height 5\' 4"  (1.626 m), weight 60.3 kg, last menstrual period 08/10/2020, SpO2 98 %.   Physical Examination:                                                                                                      General: WDWN female. Appears calm and comfortable in bed. HEENT:  Normocephalic, no lesions, without obvious abnormality.  Exophthalmos. Normal external ears, external nose. Cardiovascular:RRR Pulmonary: Breathing comfortably on room air Abdomen: soft Extremities: LL  edema, L>R (normal for her), hyperpigmentation on the LL bilaterally. Fistula in the right upper arm.  Musculoskeletal: Tone and bulk normal throughout; no atrophy noted  Neurological Examination:                                                                                               Mental Status: ARMA REINING is alert, oriented x 4, thought content appropriate.  Speech fluent without evidence of aphasia. Able to follow 3-step commands without difficulty. Cranial Nerves: II: Visual fields grossly normal, pupils are equal, round, reactive to light. III,IV, VI: Ptosis not present, extra-ocular muscle movements intact bilaterally, End-gaze nystagmus in all fields V,VII: Smile and eyebrow raise is symmetric. Facial light touch sensation intact bilaterally VIII: Hearing grossly intact IX,X: Uvula and palate rise symmetrically XI: SCM and bilateral shoulder shrug strength 5/5 XII: Midline tongue extension Motor: Tremor present (  normal for her) Right :     Upper extremity   5/5   Left:     Upper extremity   5/5          Lower extremity   5/5     Lower extremity   5/5 Pronator drift not present Sensory: Light touch intact throughout, bilaterally Deep Tendon Reflexes: 2+ and symmetric throughout Plantars: Right: downgoing   Left: downgoing Cerebellar: Finger-to-nose test without evidence of dysmetria or ataxia. Coordinated rapid alternating movements. Heel-to-shin test executed within normal limits. Gait: Not tested   When sitting [name] up, she turns her head and leans her body to the left. She can not sit up independently. After Dix-Hallpike maneuver with Dr. Leonel Ramsay (see below note), [name] exclusively looked to the left, which was not present during my previous interaction with her. The additional movement had worsened her vertigo, and she states that she's being "pulled" in toward the left.  Lab Results: Basic Metabolic Panel: Recent Labs  Lab 09/17/20 1311  NA 134*   K 4.9  CL 97*  GLUCOSE 166*  BUN 66*  CREATININE 8.60*    Liver Function Tests: No results for input(s): AST, ALT, ALKPHOS, BILITOT, PROT, ALBUMIN in the last 168 hours. No results for input(s): LIPASE, AMYLASE in the last 168 hours. No results for input(s): AMMONIA in the last 168 hours.  CBC: Recent Labs  Lab 09/17/20 1311  HGB 12.6  HCT 37.0    Cardiac Enzymes: No results for input(s): CKTOTAL, CKMB, CKMBINDEX, TROPONINI in the last 168 hours.  Lipid Panel: No results for input(s): CHOL, TRIG, HDL, CHOLHDL, VLDL, LDLCALC in the last 168 hours.  CBG: No results for input(s): GLUCAP in the last 168 hours.  Microbiology: Results for orders placed or performed during the hospital encounter of 06/30/20  SARS CORONAVIRUS 2 (TAT 6-24 HRS) Nasopharyngeal Nasopharyngeal Swab     Status: None   Collection Time: 06/30/20  2:22 PM   Specimen: Nasopharyngeal Swab  Result Value Ref Range Status   SARS Coronavirus 2 NEGATIVE NEGATIVE Final    Comment: (NOTE) SARS-CoV-2 target nucleic acids are NOT DETECTED.  The SARS-CoV-2 RNA is generally detectable in upper and lower respiratory specimens during the acute phase of infection. Negative results do not preclude SARS-CoV-2 infection, do not rule out co-infections with other pathogens, and should not be used as the sole basis for treatment or other patient management decisions. Negative results must be combined with clinical observations, patient history, and epidemiological information. The expected result is Negative.  Fact Sheet for Patients: SugarRoll.be  Fact Sheet for Healthcare Providers: https://www.woods-mathews.com/  This test is not yet approved or cleared by the [place] FDA and  has been authorized for detection and/or diagnosis of SARS-CoV-2 by FDA under an Emergency Use Authorization (EUA). This EUA will remain  in effect (meaning this test can be used) for the  duration of the COVID-19 declaration under Se ction 564(b)(1) of the Act, 21 U.S.C. section 360bbb-3(b)(1), unless the authorization is terminated or revoked sooner.  Performed at [facility], [place] [address] [place], [place] [address]     Coagulation Studies: No results for input(s): LABPROT, INR in the last 72 hours.  Imaging: No results found.   Solon Augusta PA-C [facility]  09/17/2020, 1:22 PM  I have seen the patient reviewed the above note.  She has multidirectional nystagmus on exam, she has tremor on exam bilaterally, though without past-pointing unclear if ataxia or her baseline  tremor.  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  She will need a full work-up for embolic phenomena including antiphospholipid antibodies considering her lupus diagnosis.  She will also likely need physical therapy  - HgbA1c, fasting lipid panel - Frequent neuro checks - Echocardiogram - CTA head and neck - Prophylactic therapy-Antiplatelet med: Aspirin - dose 325mg  PO or 300mg  PR - Risk factor modification - Telemetry monitoring - PT consult, OT consult, Speech consult - Stroke team to follow  Roland Rack, MD [facility] [phone]  If 7pm- 7am, please page neurology on call as listed in [place].

## 2020-09-18 ENCOUNTER — Inpatient Hospital Stay (HOSPITAL_COMMUNITY): Payer: Medicare Other

## 2020-09-18 DIAGNOSIS — I340 Nonrheumatic mitral (valve) insufficiency: Secondary | ICD-10-CM

## 2020-09-18 DIAGNOSIS — I361 Nonrheumatic tricuspid (valve) insufficiency: Secondary | ICD-10-CM | POA: Diagnosis not present

## 2020-09-18 DIAGNOSIS — I351 Nonrheumatic aortic (valve) insufficiency: Secondary | ICD-10-CM

## 2020-09-18 DIAGNOSIS — I639 Cerebral infarction, unspecified: Secondary | ICD-10-CM

## 2020-09-18 LAB — ECHOCARDIOGRAM COMPLETE
AR max vel: 2.4 cm2
AV Area VTI: 2.21 cm2
AV Area mean vel: 2.45 cm2
AV Mean grad: 4 mmHg
AV Peak grad: 8.1 mmHg
Ao pk vel: 1.42 m/s
Area-P 1/2: 1.98 cm2
Height: 64 in
MV M vel: 5.49 m/s
MV Peak grad: 120.6 mmHg
P 1/2 time: 816 msec
Radius: 0.6 cm
S' Lateral: 1.8 cm
Weight: 2169.33 oz

## 2020-09-18 LAB — HEMOGLOBIN A1C
Hgb A1c MFr Bld: 7.2 % — ABNORMAL HIGH (ref 4.8–5.6)
Mean Plasma Glucose: 159.94 mg/dL

## 2020-09-18 LAB — LIPID PANEL
Cholesterol: 209 mg/dL — ABNORMAL HIGH (ref 0–200)
HDL: 77 mg/dL (ref >40)
LDL Cholesterol: 105 mg/dL — ABNORMAL HIGH (ref 0–99)
Total CHOL/HDL Ratio: 2.7 RATIO
Triglycerides: 135 mg/dL (ref <150)
VLDL: 27 mg/dL (ref 0–40)

## 2020-09-18 LAB — RENAL FUNCTION PANEL
Albumin: 3 g/dL — ABNORMAL LOW (ref 3.5–5.0)
Anion gap: 12 (ref 5–15)
BUN: 18 mg/dL (ref 6–20)
CO2: 27 mmol/L (ref 22–32)
Calcium: 8.3 mg/dL — ABNORMAL LOW (ref 8.9–10.3)
Chloride: 96 mmol/L — ABNORMAL LOW (ref 98–111)
Creatinine, Ser: 4.24 mg/dL — ABNORMAL HIGH (ref 0.44–1.00)
GFR, Estimated: 13 mL/min — ABNORMAL LOW (ref >60)
Glucose, Bld: 135 mg/dL — ABNORMAL HIGH (ref 70–99)
Phosphorus: 3.5 mg/dL (ref 2.5–4.6)
Potassium: 3.2 mmol/L — ABNORMAL LOW (ref 3.5–5.1)
Sodium: 135 mmol/L (ref 135–145)

## 2020-09-18 LAB — CBC
HCT: 35.7 % — ABNORMAL LOW (ref 36.0–46.0)
Hemoglobin: 11.3 g/dL — ABNORMAL LOW (ref 12.0–15.0)
MCH: 28.6 pg (ref 26.0–34.0)
MCHC: 31.7 g/dL (ref 30.0–36.0)
MCV: 90.4 fL (ref 80.0–100.0)
Platelets: 75 10*3/uL — ABNORMAL LOW (ref 150–400)
RBC: 3.95 MIL/uL (ref 3.87–5.11)
RDW: 19.3 % — ABNORMAL HIGH (ref 11.5–15.5)
WBC: 9.4 10*3/uL (ref 4.0–10.5)
nRBC: 0.2 % (ref 0.0–0.2)

## 2020-09-18 LAB — VITAMIN B12: Vitamin B-12: 488 pg/mL (ref 180–914)

## 2020-09-18 LAB — ALT: ALT: 84 U/L — ABNORMAL HIGH (ref 0–44)

## 2020-09-18 LAB — TSH: TSH: 1.058 u[IU]/mL (ref 0.350–4.500)

## 2020-09-18 LAB — C-REACTIVE PROTEIN: CRP: 4.2 mg/dL — ABNORMAL HIGH (ref <1.0)

## 2020-09-18 LAB — RAPID HIV SCREEN (HIV 1/2 AB+AG)
HIV 1/2 Antibodies: NONREACTIVE
HIV-1 P24 Antigen - HIV24: NONREACTIVE

## 2020-09-18 LAB — HEPATITIS B SURFACE ANTIGEN: Hepatitis B Surface Ag: NONREACTIVE

## 2020-09-18 LAB — HEPATITIS B CORE ANTIBODY, TOTAL: Hep B Core Total Ab: NONREACTIVE

## 2020-09-18 LAB — SEDIMENTATION RATE: Sed Rate: 23 mm/hr — ABNORMAL HIGH (ref 0–22)

## 2020-09-18 MED ORDER — IOHEXOL 350 MG/ML SOLN
75.0000 mL | Freq: Once | INTRAVENOUS | Status: AC | PRN
  Administered 2020-09-18: 75 mL via INTRAVENOUS

## 2020-09-18 MED ORDER — ASPIRIN EC 81 MG PO TBEC
81.0000 mg | DELAYED_RELEASE_TABLET | Freq: Every day | ORAL | Status: DC
  Administered 2020-09-18 – 2020-09-20 (×3): 81 mg via ORAL
  Filled 2020-09-18 (×3): qty 1

## 2020-09-18 NOTE — Progress Notes (Signed)
Family Medicine Teaching Service Daily Progress Note Intern Pager: [phone]  Patient name: [name] Medical record number: [id number] Date of birth: [birth year] Age: 44 y.o. Gender: female  Primary Care Provider: Leeanne Rio, MD Consultants: Neurology   Code Status: Full Code  Pt Overview and Major Events to Date:  10/30 Admitted  Assessment and Plan: [name] is a 44 y.o. female who presents with acute dizziness and vomiting found to have acute cerebellar CVA. PMHx significant for SLE, ESRD, HTN, HLD.  Acute cerebellar CVA Acute-onset vertigo symptoms found to have acute cerebellar infarct involving the midline inferior vermis as well as age indeterminate infarct of the posterior left frontal lobe noted on MRI. CTA head/neck notable for moderate stenosis of the supraclinoid internal carotid arteries bilaterally. DVT study negative. Hypercoagulability labs (including APLA) in process given young age and history of SLE. Subjectively improving. - neurology following, appreciate recommendations - prophylactic ASA 325 mg - atorvastatin 40 mg - f/u TTE - f/u hypercoagulability labs - PT/OT - SLP  SLE Home meds: prednisone 10 mg. Diagnosed 2005, follows with [name]. - continue home meds  HTN Home meds: diltiazem 240 mg XR, hydralazine 25 mg BID. BP this AM 134/97. 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 TTS HD.  Frequent PVCs Seen on monitor here.  Previously seen by cardiologist for possible frequent PACs and wore a Holter monitor for 1 week, which she states results were normal. - continue diltiazem as above - cardiac monitoring - f/u TTE  Transaminitis Mildly elevated with AST/ALT 78/108 on admission. Improving. - consider further workup (hepatitis panel, RUQ [place]) if worsening  Other problems chronic and stable: Anxiety Insomnia GERD  FEN/GI: Renal diet PPx: SCDs  Disposition: med-tele  Subjective:  Reports feeling improvement in  her dizziness.  No other concerns at this time.  Objective: Temp:  [97.4 F (36.3 C)-98.6 F (37 C)] 98.1 F (36.7 C) (10/31 0500) Pulse Rate:  [67-88] 87 (10/30 2254) Resp:  [10-18] 12 (10/30 2254) BP: (112-146)/(76-114) 134/97 (10/31 0500) SpO2:  [96 %-100 %] 99 % (10/30 2254) Weight:  [60.3 kg-61.5 kg] 61.5 kg (10/30 1940) Physical Exam: General: Alert, middle-aged female resting in bed comfortably, NAD Cardiovascular: RRR, no murmurs Respiratory: CTAB Abdomen: soft, non-tender, +BS Extremities: WWP, no edema Neuro: CN II-XII intact, 5/5 strength bilateral upper and lower extremities, heel-to-shin intact bilaterally, finger-to-nose intact bilaterally, faint gaze-evoked horizontal nystagmus bilaterally and vertically, eyes fix to target with head impulse test, negative test of skew  Laboratory: Recent Labs  Lab 09/17/20 1303 09/17/20 1311 09/18/20 0201  WBC 12.1*  --  9.4  HGB 10.9* 12.6 11.3*  HCT 35.1* 37.0 35.7*  PLT 78*  --  75*   Recent Labs  Lab 09/17/20 1303 09/17/20 1311 09/18/20 0201  NA 136 134* 135  K 5.3* 4.9 3.2*  CL 98 97* 96*  CO2 24  --  27  BUN 56* 66* 18  CREATININE 8.21* 8.60* 4.24*  CALCIUM 8.9  --  8.3*  PROT 5.7*  --   --   BILITOT 0.9  --   --   ALKPHOS 123  --   --   ALT 105*  --  84*  AST 78*  --   --   GLUCOSE 170* 166* 135*    Imaging/Diagnostic Tests: CT ANGIO HEAD W OR WO CONTRAST  Result Date: 09/18/2020 CLINICAL DATA:  Small cerebellar stroke on MRI EXAM: CT ANGIOGRAPHY HEAD AND NECK TECHNIQUE: Multidetector CT  imaging of the head and neck was performed using the standard protocol during bolus administration of intravenous contrast. Multiplanar CT image reconstructions and MIPs were obtained to evaluate the vascular anatomy. Carotid stenosis measurements (when applicable) are obtained utilizing NASCET criteria, using the distal internal carotid diameter as the denominator. CONTRAST:  17mL OMNIPAQUE IOHEXOL 350 MG/ML SOLN  COMPARISON:  None. FINDINGS: CT HEAD Brain: There is a small area hypoattenuation corresponding to area of midline inferior cerebellar infarction on prior MRI. There is no acute intracranial hemorrhage or mass effect. No acute appearing loss of gray-white differentiation. Chronic left parietal infarct. There is no extra-axial fluid collection. Ventricles and sulci are within normal limits in size and configuration. Vascular: Better evaluated on CTA portion. Skull: Calvarium is unremarkable. Sinuses/Orbits: Bilateral proptosis.  No acute finding. Other: None. Review of the MIP images confirms the above findings CTA NECK Aortic arch: Great vessel origins are patent. There is direct origin of the left vertebral artery from the arch. Right carotid system: Patent. Minimal calcified plaque at the ICA origin without measurable stenosis. Left carotid system: Patent. Mild calcified plaque at the ICA origin without measurable stenosis. Vertebral arteries: Patent.  Left vertebral artery is dominant. Skeleton: No significant abnormality. Other neck: No mass or adenopathy. Upper chest: No apical lung mass. Right IJ approach catheter enters the SVC. Review of the MIP images confirms the above findings CTA HEAD Anterior circulation: Intracranial internal carotid arteries are patent with calcified plaque. Moderate stenosis of the supraclinoid portions bilaterally. Anterior and middle cerebral arteries are patent. Posterior circulation: Intracranial vertebral arteries are patent. Right vertebral artery becomes diminutive after PICA origin. Both PICA origins are patent. Basilar artery is patent. Superior cerebellar artery origins are patent. Posterior cerebral arteries are patent. Bilateral posterior communicating arteries are present with fetal origin of the left posterior cerebral artery. Venous sinuses: Patent as allowed by contrast bolus timing. Review of the MIP images confirms the above findings IMPRESSION: Small acute midline  cerebellar infarct as seen on MRI. No new intracranial abnormality. No hemodynamically significant stenosis in the neck. No proximal intracranial vessel occlusion. 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 Electronically Signed   By: Macy Mis M.D.   On: 09/18/2020 08:01   CT ANGIO NECK W OR WO CONTRAST  Result Date: 09/18/2020 CLINICAL DATA:  Small cerebellar stroke on MRI EXAM: CT ANGIOGRAPHY HEAD AND NECK TECHNIQUE: Multidetector CT imaging of the head and neck was performed using the standard protocol during bolus administration of intravenous contrast. Multiplanar CT image reconstructions and MIPs were obtained to evaluate the vascular anatomy. Carotid stenosis measurements (when applicable) are obtained utilizing NASCET criteria, using the distal internal carotid diameter as the denominator. CONTRAST:  60mL OMNIPAQUE IOHEXOL 350 MG/ML SOLN COMPARISON:  None. FINDINGS: CT HEAD Brain: There is a small area hypoattenuation corresponding to area of midline inferior cerebellar infarction on prior MRI. There is no acute intracranial hemorrhage or mass effect. No acute appearing loss of gray-white differentiation. Chronic left parietal infarct. There is no extra-axial fluid collection. Ventricles and sulci are within normal limits in size and configuration. Vascular: Better evaluated on CTA portion. Skull: Calvarium is unremarkable. Sinuses/Orbits: Bilateral proptosis.  No acute finding. Other: None. Review of the MIP images confirms the above findings CTA NECK Aortic arch: Great vessel origins are patent. There is direct origin of the left vertebral artery from the arch. Right carotid system: Patent. Minimal calcified plaque at the ICA origin without measurable stenosis. Left carotid system: Patent. Mild  calcified plaque at the ICA origin without measurable stenosis. Vertebral arteries: Patent.  Left vertebral artery is dominant. Skeleton: No significant abnormality.  Other neck: No mass or adenopathy. Upper chest: No apical lung mass. Right IJ approach catheter enters the SVC. Review of the MIP images confirms the above findings CTA HEAD Anterior circulation: Intracranial internal carotid arteries are patent with calcified plaque. Moderate stenosis of the supraclinoid portions bilaterally. Anterior and middle cerebral arteries are patent. Posterior circulation: Intracranial vertebral arteries are patent. Right vertebral artery becomes diminutive after PICA origin. Both PICA origins are patent. Basilar artery is patent. Superior cerebellar artery origins are patent. Posterior cerebral arteries are patent. Bilateral posterior communicating arteries are present with fetal origin of the left posterior cerebral artery. Venous sinuses: Patent as allowed by contrast bolus timing. Review of the MIP images confirms the above findings IMPRESSION: Small acute midline cerebellar infarct as seen on MRI. No new intracranial abnormality. No hemodynamically significant stenosis in the neck. No proximal intracranial vessel occlusion. 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 Electronically Signed   By: Macy Mis M.D.   On: 09/18/2020 08:01   MR BRAIN WO CONTRAST  Result Date: 09/17/2020 CLINICAL DATA:  Dizziness EXAM: MRI HEAD WITHOUT CONTRAST TECHNIQUE: Multiplanar, multiecho pulse sequences of the brain and surrounding structures were obtained without intravenous contrast. COMPARISON:  None. FINDINGS: Motion artifact is present. Brain: There is an 8 mm focus of mildly reduced diffusion involving the midline inferior vermis. Chronic left parietal infarct with chronic blood products. There is no intracranial mass, mass effect, or edema. There is no hydrocephalus or extra-axial fluid collection. Ventricles and sulci are normal in size and configuration. Vascular: Major vessel flow voids at the skull base are preserved. Skull and upper cervical spine:  Normal marrow signal is preserved. Sinuses/Orbits: Paranasal sinuses are aerated. Orbits are unremarkable. Other: Sella is unremarkable.  Mastoid air cells are clear. IMPRESSION: Motion degraded. Subcentimeter acute infarct involving the midline inferior vermis. Chronic left parietal infarct. Electronically Signed   By: Macy Mis M.D.   On: 09/17/2020 13:59   VAS [place] LOWER EXTREMITY VENOUS (DVT)  Result Date: 09/18/2020  Lower Venous DVT Study Indications: Stroke, and Lupus.  Comparison Study: No prior study Performing Technologist: Sharion Dove RVS  Examination Guidelines: A complete evaluation includes B-mode imaging, spectral Doppler, color Doppler, and power Doppler as needed of all accessible portions of each vessel. Bilateral testing is considered an integral part of a complete examination. Limited examinations for reoccurring indications may be performed as noted. The reflux portion of the exam is performed with the patient in reverse Trendelenburg.  +---------+---------------+---------+-----------+----------+--------------+ RIGHT    CompressibilityPhasicitySpontaneityPropertiesThrombus Aging +---------+---------------+---------+-----------+----------+--------------+ CFV      Full           Yes      Yes                                 +---------+---------------+---------+-----------+----------+--------------+ SFJ      Full                                                        +---------+---------------+---------+-----------+----------+--------------+ FV Prox  Full                                                        +---------+---------------+---------+-----------+----------+--------------+  FV Mid   Full                                                        +---------+---------------+---------+-----------+----------+--------------+ FV DistalFull                                                         +---------+---------------+---------+-----------+----------+--------------+ PFV      Full                                                        +---------+---------------+---------+-----------+----------+--------------+ POP      Full           Yes      Yes                                 +---------+---------------+---------+-----------+----------+--------------+ PTV      Full                                                        +---------+---------------+---------+-----------+----------+--------------+ PERO     Full                                                        +---------+---------------+---------+-----------+----------+--------------+   +---------+---------------+---------+-----------+----------+--------------+ LEFT     CompressibilityPhasicitySpontaneityPropertiesThrombus Aging +---------+---------------+---------+-----------+----------+--------------+ CFV      Full           Yes      Yes                                 +---------+---------------+---------+-----------+----------+--------------+ SFJ      Full                                                        +---------+---------------+---------+-----------+----------+--------------+ FV Prox  Full                                                        +---------+---------------+---------+-----------+----------+--------------+ FV Mid   Full                                                        +---------+---------------+---------+-----------+----------+--------------+  FV DistalFull                                                        +---------+---------------+---------+-----------+----------+--------------+ PFV      Full                                                        +---------+---------------+---------+-----------+----------+--------------+ POP      Full           Yes      Yes                                  +---------+---------------+---------+-----------+----------+--------------+ PTV      Full                                                        +---------+---------------+---------+-----------+----------+--------------+ PERO     Full                                                        +---------+---------------+---------+-----------+----------+--------------+     Summary: BILATERAL: - No evidence of deep vein thrombosis seen in the lower extremities, bilaterally. -   *See table(s) above for measurements and observations.    Preliminary      Zola Button, MD 09/18/2020, 7:33 AM PGY-1, Reading Intern pager: [phone], text pages welcome

## 2020-09-18 NOTE — Evaluation (Signed)
Physical Therapy Evaluation Patient Details Name: [name] MRN: [id number] DOB: 07/15/1976 Today's Date: 09/18/2020   History of Present Illness  44 yo admitted with dizziness with MRI demonstrating acute cerebellar infarct with chronic left parietal infarct. PMhx: ESRD, SLE, anemia, thrombocytopenia, HTN, HLD  Clinical Impression  Pt pleasant and wanting to get OOB. Pt with good bed mobility and able to stand with noted symptom of dizziness with initial standing with education for gaze stabilization throughout gait with pt able to walk with single UE support. Pt in static standing with head turns without symptoms with noted decreased bil LE coordination and balance. Pt does not have transportation other than intermittent assist of neighbor and would benefit from initial CIR stay if unable to achieve mod I prior to D/C to maximize balance and function. Pt encouraged to utilize gaze stabilization with mobility with staff and to ask for assist to move.     Follow Up Recommendations Supervision for mobility/OOB;CIR (pending progression and assist)    Equipment Recommendations  Other (comment) (TBD)    Recommendations for Other Services OT consult     Precautions / Restrictions Precautions Precautions: Fall      Mobility  Bed Mobility Overal bed mobility: Needs Assistance Bed Mobility: Supine to Sit     Supine to sit: Supervision     General bed mobility comments: supervision for safety to raise from low bed    Transfers Overall transfer level: Needs assistance   Transfers: Sit to/from Stand Sit to Stand: Min assist         General transfer comment: initial standing with min assist as pt rotating trunk and head toward left reporting dizziness with standing and needed multimodal cues to return to midline  Ambulation/Gait Ambulation/Gait assistance: Min assist Gait Distance (Feet): 175 Feet Assistive device: 1 person hand held assist Gait Pattern/deviations:  Step-through pattern   Gait velocity interpretation: 1.31 - 2.62 ft/sec, indicative of limited community ambulator General Gait Details: unsteady gait with HHA and gaze fixation to improve stability, pt at times staggering and needs support for balance  Stairs            Wheelchair Mobility    Modified Rankin (Stroke Patients Only) Modified Rankin (Stroke Patients Only) Pre-Morbid Rankin Score: Slight disability Modified Rankin: Moderately severe disability     Balance Overall balance assessment: Needs assistance   Sitting balance-Leahy Scale: Good     Standing balance support: Single extremity supported Standing balance-Leahy Scale: Poor Standing balance comment: UE support in standing, static standing with guarding     Tandem Stance - Right Leg: 5 Tandem Stance - Left Leg: 10 Rhomberg - Eyes Opened: 30                   Pertinent Vitals/Pain Pain Assessment: No/denies pain    Home Living Family/patient expects to be discharged to:: Private residence Living Arrangements: Alone Available Help at Discharge: Family;Neighbor (niece does not drive but neighbor does) Type of Home: Apartment Home Access: Stairs to enter;Elevator     Home Layout: One level Home Equipment: Cane - single point      Prior Function Level of Independence: Independent with assistive device(s)               Hand Dominance        Extremity/Trunk Assessment   Upper Extremity Assessment Upper Extremity Assessment: Overall WFL for tasks assessed (pt able to complete finger to thumb opposition, finger to nose and supination/pronation)    Lower  Extremity Assessment Lower Extremity Assessment: LLE deficits/detail;RLE deficits/detail RLE Deficits / Details: strength WFL with decreased RAM of toe tapping unable to maintain coordination together, good performance with heel to shin LLE Deficits / Details: strength WFL with decreased RAM of toe tapping unable to maintain  coordination together, good performance with heel to shin    Cervical / Trunk Assessment Cervical / Trunk Assessment: Normal  Communication   Communication: No difficulties  Cognition Arousal/Alertness: Awake/alert Behavior During Therapy: WFL for tasks assessed/performed Overall Cognitive Status: Within Functional Limits for tasks assessed                                        General Comments      Exercises     Assessment/Plan    PT Assessment Patient needs continued PT services  PT Problem List Decreased mobility;Decreased coordination;Decreased activity tolerance;Decreased balance       PT Treatment Interventions DME instruction;Therapeutic exercise;Gait training;Balance training;Functional mobility training;Therapeutic activities;Patient/family education;Neuromuscular re-education    PT Goals (Current goals can be found in the Care Plan section)  Acute Rehab PT Goals Patient Stated Goal: return home PT Goal Formulation: With patient Time For Goal Achievement: 10/02/20 Potential to Achieve Goals: Good    Frequency Min 4X/week   Barriers to discharge Decreased caregiver support      Co-evaluation               AM-PAC PT "6 Clicks" Mobility  Outcome Measure Help needed turning from your back to your side while in a flat bed without using bedrails?: A Little Help needed moving from lying on your back to sitting on the side of a flat bed without using bedrails?: A Little Help needed moving to and from a bed to a chair (including a wheelchair)?: A Little Help needed standing up from a chair using your arms (e.g., wheelchair or bedside chair)?: A Little Help needed to walk in hospital room?: A Little Help needed climbing 3-5 steps with a railing? : A Lot 6 Click Score: 17    End of Session Equipment Utilized During Treatment: Gait belt Activity Tolerance: Patient tolerated treatment well Patient left: in chair;with call bell/phone within  reach;with chair alarm set Nurse Communication: Mobility status PT Visit Diagnosis: Other abnormalities of gait and mobility (R26.89);Difficulty in walking, not elsewhere classified (R26.2);Other symptoms and signs involving the nervous system (R29.898)    Time: 0355-9741 PT Time Calculation (min) (ACUTE ONLY): 27 min   Charges:   PT Evaluation $PT Eval Moderate Complexity: 1 Mod PT Treatments $Gait Training: 8-22 mins        Kaz Auld P, PT Acute Rehabilitation Services Pager: [phone] Office: [place] 09/18/2020, 12:36 PM

## 2020-09-18 NOTE — Progress Notes (Signed)
   09/17/20 2254  Hand-Off documentation  Handoff Given Given to shift RN/LPN  Report given to (Full Name) Jerolyn Center, RN  Handoff Received Received from shift RN/LPN  Report received from (Full Name) Demaryius Imran  Vitals  Temp 98.5 F (36.9 C)  Temp Source Oral  BP 140/80  BP Location Left Arm  BP Method Automatic  Patient Position (if appropriate) Lying  Pulse Rate 87  Pulse Rate Source Monitor  Resp 12  Oxygen Therapy  SpO2 99 %  O2 Device Room Air  Pain Assessment  Pain Scale 0-10  Pain Score 0  Post-Hemodialysis Assessment  Rinseback Volume (mL) 250 mL  KECN 245 V  Dialyzer Clearance Lightly streaked  Duration of HD Treatment -hour(s) 3 hour(s)  Hemodialysis Intake (mL) 500 mL  UF Total -Machine (mL) 2500 mL  Net UF (mL) 2000 mL  Tolerated HD Treatment Yes  Post-Hemodialysis Comments tx achieved as expected, n complaints.  AVG/AVF Arterial Site Held (minutes) 0 minutes  AVG/AVF Venous Site Held (minutes) 0 minutes  Education / Care Plan  Dialysis Education Provided Yes  Documented Education in Care Plan Yes  Outpatient Plan of Care Reviewed and on Chart Yes  Note  Observations pt is stable

## 2020-09-18 NOTE — Evaluation (Signed)
Clinical/Bedside Swallow Evaluation Patient Details  Name: [name] MRN: [id number] Date of Birth: 11/08/1976  Today's Date: 09/18/2020 Time: SLP Start Time (ACUTE ONLY): 1155 SLP Stop Time (ACUTE ONLY): 1211 SLP Time Calculation (min) (ACUTE ONLY): 16 min  Past Medical History:  Past Medical History:  Diagnosis Date  . Allergy   . Anemia   . Anxiety   . Arthritis   . Avascular necrosis of bone (HCC)    left tibial talus due to chronic prednisone use  . Cerebellar stroke ([place])   . Depression   . Dyspnea    too much  fluid  . ESRD (end stage renal disease) on dialysis ([place])    tthsat [place]  . GERD (gastroesophageal reflux disease)   . Headache(784.0)   . History of high blood pressure   . Hyperlipemia   . Hypertension   . Lupus ([place])   . Pneumonia   . Secondary hyperparathyroidism (of renal origin)    Past Surgical History:  Past Surgical History:  Procedure Laterality Date  . A/V FISTULAGRAM Left 12/02/2017   Procedure: A/V FISTULAGRAM - Left upper;  Surgeon: Angelia Mould, MD;  Location: [place] CV LAB;  Service: Cardiovascular;  Laterality: Left;  . AV FISTULA PLACEMENT Left   . AV FISTULA PLACEMENT Right 01/26/2014   Procedure: ARTERIOVENOUS (AV) FISTULA CREATION; ultrasound guided;  Surgeon: Conrad [place], MD;  Location: [place];  Service: Vascular;  Laterality: Right;  . AV FISTULA PLACEMENT Right 04/25/2020   Procedure: RIGHT UPPER ARM  BASILIC VEIN ARTERIOVENOUS (AV) FISTULA;  Surgeon: [name], MD;  Location: [place];  Service: Vascular;  Laterality: Right;  . BASCILIC VEIN TRANSPOSITION Right 07/01/2020   Procedure: SECOND STAGE BASCILIC VEIN TRANSPOSITION RIGHT UPPER EXTREMITY;  Surgeon: [name], MD;  Location: [facility] OR;  Service: Vascular;  Laterality: Right;  . ESOPHAGOGASTRODUODENOSCOPY N/A 03/17/2016   Procedure: ESOPHAGOGASTRODUODENOSCOPY (EGD);  Surgeon: Irene Shipper, MD;  Location: [facility] ENDOSCOPY;  Service: Endoscopy;   Laterality: N/A;  . FISTULOGRAM Left 09/24/2019   Procedure: FISTULOGRAM LEFT ARM;  Surgeon: [name], MD;  Location: [place];  Service: Vascular;  Laterality: Left;  . HEMATOMA EVACUATION Left 10/09/2013   Procedure: EVACUATION HEMATOMA;  Surgeon: Angelia Mould, MD;  Location: [place];  Service: Vascular;  Laterality: Left;  . HEMATOMA EVACUATION Left 09/24/2019   Procedure: EVACUATION HEMATOMA  LEFT ARM;  Surgeon: [name], MD;  Location: [place];  Service: Vascular;  Laterality: Left;  . INCISION AND DRAINAGE ABSCESS     gluteal  . INSERTION OF DIALYSIS CATHETER N/A 10/09/2013   Procedure: INSERTION OF DIALYSIS CATHETER; ULTRASOUND GUIDED;  Surgeon: Angelia Mould, MD;  Location: [place];  Service: Vascular;  Laterality: N/A;  . INSERTION OF DIALYSIS CATHETER Left 09/24/2019   Procedure: INSERTION OF TUNNELLED DIALYSIS CATHETER - PALINDROME 15FR X 28CM;  Surgeon: [name], MD;  Location: [place];  Service: Vascular;  Laterality: Left;  . INSERTION OF DIALYSIS CATHETER Right 03/15/2020   Procedure: Insertion Of Dialysis Catheter;  Surgeon: Angelia Mould, MD;  Location: [place];  Service: Cardiovascular;  Laterality: Right;  . PERIPHERAL VASCULAR BALLOON ANGIOPLASTY Left 12/02/2017   Procedure: PERIPHERAL VASCULAR BALLOON ANGIOPLASTY;  Surgeon: Angelia Mould, MD;  Location: [place] CV LAB;  Service: Cardiovascular;  Laterality: Left;  . REVISION OF ARTERIOVENOUS GORETEX GRAFT Left 04/03/2019   Procedure: REVISION OF ARTERIOVENOUS GORETEX GRAFT PSEUDOANEURYSM;  Surgeon: Angelia Mould, MD;  Location: [facility] OR;  Service: Vascular;  Laterality: Left;  . SHUNTOGRAM N/A 01/07/2014   Procedure: fistulogram with possibe venoplasty left upper arm avg;  Surgeon: Angelia Mould, MD;  Location: [facility] CATH LAB;  Service: Cardiovascular;  Laterality: N/A;  . THROMBECTOMY AND REVISION OF ARTERIOVENTOUS (AV) GORETEX  GRAFT Left 03/15/2020    Procedure: ATTEMPTED THROMBECTOMY OF LEFT ARM ARTERIOVENTOUS (AV) GORETEX  GRAFT;  Surgeon: Angelia Mould, MD;  Location: [place];  Service: Cardiovascular;  Laterality: Left;  . tibiocalcaneal fusion  left Left   . ULTRASOUND GUIDANCE FOR VASCULAR ACCESS  09/24/2019   Procedure: Ultrasound Guidance For Vascular Access;  Surgeon: [name], MD;  Location: [facility] OR;  Service: Vascular;;   HPI:  Pt is a 44 y.o. female with PMH is significant for lupus, ESRD, HTN, and HLD who presented with dizziness accompanied with vomiting and one episode of diarrheal incontinence. MRI brain: Subcentimeter acute infarct involving the midline inferior vermis. Chronic left parietal infarct.   Assessment / Plan / Recommendation Clinical Impression  Pt was seen for bedside swallow evaluation. Pt reported that if she drinks water at night and then goes back to sleep, she gets "choked". Howeever, she denied having this symptom during the day and stated that it is restricted to water and not other liquids such as ginger ale. Pt denied any symptoms of oropharyngeal dysphagia with meals or any difficulty with p.o. intake since admission. Oral mechanism exam was [facility] and dentition was adequate. She tolerated all solids and liquids without signs or symptoms of oropharyngeal dysphagia. A regular texture diet with thin liquids is recommended at this time and further skilled SLP services are not clinically indicated for swallowing.  SLP Visit Diagnosis: Dysphagia, unspecified (R13.10)    Aspiration Risk  No limitations    Diet Recommendation Regular;Thin liquid   Liquid Administration via: Cup;Straw Medication Administration: Whole meds with liquid Supervision: Patient able to self feed Postural Changes: Seated upright at 90 degrees    Other  Recommendations Oral Care Recommendations: Oral care BID   Follow up Recommendations None      Frequency and Duration            Prognosis        Swallow  Study   General Date of Onset: 09/17/20 HPI: Pt is a 44 y.o. female with PMH is significant for lupus, ESRD, HTN, and HLD who presented with dizziness accompanied with vomiting and one episode of diarrheal incontinence. MRI brain: Subcentimeter acute infarct involving the midline inferior vermis. Chronic left parietal infarct. Type of Study: Bedside Swallow Evaluation Previous Swallow Assessment: NOne Diet Prior to this Study: Regular;Thin liquids Temperature Spikes Noted: No Respiratory Status: Room air History of Recent Intubation: No Behavior/Cognition: Alert;Cooperative;Pleasant mood Oral Cavity Assessment: Within Functional Limits Oral Care Completed by SLP: No Oral Cavity - Dentition: Adequate natural dentition Vision: Functional for self-feeding Self-Feeding Abilities: Able to feed self Patient Positioning: Upright in chair;Postural control adequate for testing Baseline Vocal Quality: Normal Volitional Cough: Strong Volitional Swallow: Able to elicit    Oral/Motor/Sensory Function Overall Oral Motor/Sensory Function: Within functional limits   Ice Chips Ice chips: Within functional limits Presentation: Spoon   Thin Liquid Thin Liquid: Within functional limits Presentation: Straw;Cup    Nectar Thick Nectar Thick Liquid: Not tested   Honey Thick Honey Thick Liquid: Not tested   Puree Puree: Within functional limits Presentation: Spoon   Solid     Solid: Within functional limits Presentation: Self Fed  [name], [place], [place] Office number [phone] Pager [phone]  Horton Marshall 09/18/2020,12:11 PM

## 2020-09-18 NOTE — Progress Notes (Signed)
VASCULAR LAB    Bilateral lower extremity venous duplex has been performed.  See CV proc for preliminary results.   Myan Suit, RVT 09/18/2020, 9:13 AM

## 2020-09-18 NOTE — Progress Notes (Signed)
  Echocardiogram 2D Echocardiogram has been performed.  [name] 09/18/2020, 9:46 AM

## 2020-09-18 NOTE — Progress Notes (Addendum)
[facility] Progress Note   Subjective:  Completed dialysis last night -net UF 2L. No issues on HD Seen in room. Feeling much better, but still some dizziness, unsteady Got up and walked with PT. Not sure if has completed all of the testing yet   Objective Vitals:   09/17/20 2254 09/17/20 2329 09/18/20 0500 09/18/20 1024  BP: 140/80  (!) 134/97 (!) 147/88  Pulse: 87   (!) 101  Resp: 12   14  Temp: 98.5 F (36.9 C) 98.6 F (37 C) 98.1 F (36.7 C)   TempSrc: Oral Oral Oral   SpO2: 99%   100%  Weight:      Height:         Additional Objective Labs: Basic Metabolic Panel: Recent Labs  Lab 09/17/20 1303 09/17/20 1311 09/18/20 0201  NA 136 134* 135  K 5.3* 4.9 3.2*  CL 98 97* 96*  CO2 24  --  27  GLUCOSE 170* 166* 135*  BUN 56* 66* 18  CREATININE 8.21* 8.60* 4.24*  CALCIUM 8.9  --  8.3*  PHOS  --   --  3.5   CBC: Recent Labs  Lab 09/17/20 1303 09/17/20 1311 09/18/20 0201  WBC 12.1*  --  9.4  NEUTROABS 9.9*  --   --   HGB 10.9* 12.6 11.3*  HCT 35.1* 37.0 35.7*  MCV 91.9  --  90.4  PLT 78*  --  75*   Blood Culture    Component Value Date/Time   SDES BLOOD RIGHT FOREARM UPPER 03/15/2020 0408   SPECREQUEST  03/15/2020 0408    BOTTLES DRAWN AEROBIC AND ANAEROBIC Blood Culture adequate volume   CULT  03/15/2020 0408    NO GROWTH 5 DAYS Performed at [facility], [address], [place], [place] [address]    REPTSTATUS 03/20/2020 FINAL 03/15/2020 0408     Physical Exam General: Sitting up in recliner, nad  Heart: RRR Lungs: Clear bilaterally Abdomen: Soft non-tender  Extremities: Bilat 1+ LE edema, some RUE edema  Dialysis Access: R IJ TDC dsg c,d,i; R AVF +bruit   Medications: . sodium chloride    . sodium chloride     . aspirin EC  81 mg Oral Daily  . atorvastatin  40 mg Oral Daily  . calcium acetate (Phos Binder)  1,334 mg Oral TID WC  . Chlorhexidine Gluconate Cloth  6 each Topical Daily  . pantoprazole  40 mg Oral  Daily  . PARoxetine  40 mg Oral Daily  . predniSONE  10 mg Oral Q breakfast  . traZODone  100 mg Oral QHS    Dialysis Orders:  [facility] TTS 3h 400/600 2K/2.5Ca EDW 58kg TDC Hep bolus 1800 EPO 6600 q HD Venofer 50 q Mon  Assessment/Plan: 1. Dizziness/Ataxia. Stroke w/u in progress. MRI showing subcentimeter acute infarct involving the midline inferior vermis. Chronic left parietal infarct -per neuro/primary team. ASA, echo, CTA,  therapies consults 2. ESRD -  HD TTS via TDC. (Had plans for OP fistulogram this week)  Continue on schedule. Next HD Tues 11/2.  3. Hypertension/volume  - BP borderline on admission. No meds here. Probably do not want to drop BP too acutely given clinical situation 4. Anemia  - Hgb 12>113. No ESA needs currently  5. Metabolic bone disease -  Ca ok. Continue home binders (phoslyra). No VDRA.  6.  SLE   Ogechi Larina Earthly PA-C [facility] 09/18/2020,11:48 AM   Patient seen and examined, agree with above  note with above modifications. 44 year old BF with ESRD presented with dizziness- found to have an acute CVA-  Getting work up and therapies. Have kept on regular HD schedule-  Will be due next on Tuesday  Corliss Parish, MD 09/18/2020

## 2020-09-18 NOTE — Plan of Care (Signed)

## 2020-09-18 NOTE — Progress Notes (Signed)
STROKE TEAM PROGRESS NOTE   INTERVAL HISTORY No family is at the bedside.  Patient is sitting in chair, having lunch.  She stated that she felt fine today.  Dizziness and nausea vomiting much improved.  MRI showed cerebellar vermis small infarct.  Given her current stroke and old stroke on the left parietal area, as well as history of lupus, will consider TEE.  CTA head and neck unremarkable.  OBJECTIVE Vitals:   09/17/20 2254 09/17/20 2329 09/18/20 0500 09/18/20 1024  BP: 140/80  (!) 134/97 (!) 147/88  Pulse: 87   (!) 101  Resp: 12   14  Temp: 98.5 F (36.9 C) 98.6 F (37 C) 98.1 F (36.7 C)   TempSrc: Oral Oral Oral   SpO2: 99%   100%  Weight:      Height:        CBC:  Recent Labs  Lab 09/17/20 1303 09/17/20 1303 09/17/20 1311 09/18/20 0201  WBC 12.1*  --   --  9.4  NEUTROABS 9.9*  --   --   --   HGB 10.9*   < > 12.6 11.3*  HCT 35.1*   < > 37.0 35.7*  MCV 91.9  --   --  90.4  PLT 78*  --   --  75*   < > = values in this interval not displayed.    Basic Metabolic Panel:  Recent Labs  Lab 09/17/20 1303 09/17/20 1303 09/17/20 1311 09/18/20 0201  NA 136   < > 134* 135  K 5.3*   < > 4.9 3.2*  CL 98   < > 97* 96*  CO2 24  --   --  27  GLUCOSE 170*   < > 166* 135*  BUN 56*   < > 66* 18  CREATININE 8.21*   < > 8.60* 4.24*  CALCIUM 8.9  --   --  8.3*  PHOS  --   --   --  3.5   < > = values in this interval not displayed.    Lipid Panel:     Component Value Date/Time   CHOL 209 (H) 09/18/2020 0201   TRIG 135 09/18/2020 0201   HDL 77 09/18/2020 0201   CHOLHDL 2.7 09/18/2020 0201   VLDL 27 09/18/2020 0201   LDLCALC 105 (H) 09/18/2020 0201   HgbA1c:  Lab Results  Component Value Date   HGBA1C 7.2 (H) 09/18/2020   Urine Drug Screen: No results found for: LABOPIA, COCAINSCRNUR, LABBENZ, AMPHETMU, THCU, LABBARB  Alcohol Level     Component Value Date/Time   ETH <10 09/17/2020 1303    IMAGING  CT ANGIO HEAD W OR WO CONTRAST CT ANGIO NECK W OR WO  CONTRAST 09/18/2020 IMPRESSION:  Small acute midline cerebellar infarct as seen on MRI. No new intracranial abnormality. No hemodynamically significant stenosis in the neck. No proximal intracranial vessel occlusion. 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   MR BRAIN WO CONTRAST 09/17/2020 IMPRESSION:  Motion degraded. Subcentimeter acute infarct involving the midline inferior vermis. Chronic left parietal infarct.   VAS [place] LOWER EXTREMITY VENOUS (DVT) 09/18/2020 Summary:  BILATERAL: - No evidence of deep vein thrombosis seen in the lower extremities, bilaterally. -  Transthoracic Echocardiogram  1. Left ventricular ejection fraction, by estimation, is 55 to 60%. The  left ventricle has normal function. The left ventricle has no regional  wall motion abnormalities. There is severe concentric left ventricular  hypertrophy. Diastolic function indeterminant due to moderate-to-severe MAC.  2. Right ventricular systolic function is mildly reduced. The right  ventricular size is normal. There is moderately elevated pulmonary artery  systolic pressure.  3. Left atrial size was severely dilated.  4. Right atrial size was mildly dilated.  5. The mitral valve is degenerative. Moderate mitral valve regurgitation.  Severe mitral stenosis with mean gradient 71mHg and MVA of 0.73cm2 by  continuity equation at HR 88bpm. Moderate to severe mitral annular  calcification.  6. Tricuspid valve regurgitation is severe.  7. The aortic valve is tricuspid. There is mild calcification of the  aortic valve. There is mild thickening of the aortic valve. Aortic valve  regurgitation is mild. Mild to moderate aortic valve  sclerosis/calcification is present, without any evidence  of aortic stenosis.  8. The inferior vena cava is normal in size with <50% respiratory  variability, suggesting right atrial pressure of 8 mmHg.   ECG - SR rate 89 BPM. (See cardiology  reading for complete details)   PHYSICAL EXAM  Temp:  [97.8 F (36.6 C)-98.8 F (37.1 C)] 98.8 F (37.1 C) (10/31 1530) Pulse Rate:  [83-101] 98 (10/31 1530) Resp:  [10-18] 18 (10/31 1530) BP: (134-147)/(76-97) 136/90 (10/31 1530) SpO2:  [96 %-100 %] 100 % (10/31 1530) Weight:  [61.5 kg] 61.5 kg (10/30 1940)  General - Well nourished, well developed, in no apparent distress.  Ophthalmologic - fundi not visualized due to noncooperation.  Cardiovascular - Regular rhythm and rate.  Mental Status -  Level of arousal and orientation to time, place, and person were intact. Language including expression, naming, repetition, comprehension was assessed and found intact. Fund of Knowledge was assessed and was intact.  Cranial Nerves II - XII - II - Visual field intact OU. III, IV, VI - Extraocular movements intact. V - Facial sensation intact bilaterally. VII - Facial movement intact bilaterally. VIII - Hearing & vestibular intact bilaterally. X - Palate elevates symmetrically. XI - Chin turning & shoulder shrug intact bilaterally. XII - Tongue protrusion intact.  Motor Strength - The patient's strength was normal in all extremities and pronator drift was absent.  Bulk was normal and fasciculations were absent.   Motor Tone - Muscle tone was assessed at the neck and appendages and was normal.  Reflexes - The patient's reflexes were symmetrical in all extremities and she had no pathological reflexes.  Sensory - Light touch, temperature/pinprick were assessed and were symmetrical.    Coordination - The patient had grossly normal movements in the hands and feet with no ataxia or dysmetria.  Tremor was absent.  Gait and Station - deferred.   ASSESSMENT/PLAN Ms. Mary LABUDAis a 44y.o. female with history of lupus, ESRD (TThSa HD), HTN (controlled), secondary hyperparathyroidism, depression, chronic tremor, extremity edema and anxiety who presents today with a ~12 hour  history of room-spinning dizziness accompanied by nausea, vomiting, and stool incontinence. Ms. TMuziostates that she has experienced dizzy spells in the past but they were self-limiting and resolved rather quickly. She did not receive IV t-PA due to late presentation (>4.5 hours from time of onset).   Stroke: acute cerebellar vermis small infarct.  Given history of lupus, chronic left parietal infarct, concerning for embolic stroke this time - source unknown.   MRI head - Subcentimeter acute infarct involving the midline inferior vermis. Chronic left parietal infarct.   CTA H&N - Small acute midline cerebellar infarct as seen on MRI. No new intracranial abnormality. No hemodynamically significant stenosis in the neck. No proximal intracranial  vessel occlusion. 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   2D Echo - EF 55 to 60%, severe left atrial dilatation, severe mitral stenosis  LE venous Doppler negative for DVT  TEE pending  Lacey Jensen Virus 2  - negative  LDL - 105  HgbA1c - 7.2  UDS - pending  VTE prophylaxis - SCDs  No antithrombotic prior to admission, now on aspirin 81 mg daily given thrombocytopenia.  Patient counseled to be compliant with her antithrombotic medications  Ongoing aggressive stroke risk factor management  Therapy recommendations:  pending  Disposition:  Pending  Lupus  Patient has been following with rheumatology  On prednisone 10 mg daily  Autoimmune labs pending  TEE pending to rule out Libman-Sacks endocarditis  Hypercarbia work-up pending to rule out antiphospholipid syndrome  Mitral stenosis  TTE showed left atrium severely dilated with severe mitral stenosis  TEE pending  Given acute stroke and old strokes, may consider anticoagulation  Hypertension  Home BP meds: diltiazem ; hydralazine  Current BP meds: none   Stable . Long-term BP goal normotensive  Hyperlipidemia  Home Lipid  lowering medication: none   LDL 105, goal < 70  Current lipid lowering medication: Lipitor 40 mg daily   Continue statin at discharge  Pre Diabetes ?  Home diabetic meds: none   HgbA1c 7.2, goal < 7.0  SSI  CBG monitoring  PCP follow-up  Thrombocytopenia  platelets - 78->75  Aspirin 81 only  Avoid DAPT for now  Other Stroke Risk Factors   History of stroke, left parietal chronic infarct on imaging  Other Active Problems  Code status - Full Code  Hypokalemia - 3.2 supplement  ESRD on HD (Tue - Thurs - Sat)   Anemia - Hgb - 11.3 (chronic disease)  Mild Leukocytosis - WBCs - 12.1 - prednisone - afebrile    Hospital day # 1  I spent  35 minutes in total face-to-face time with the patient, more than 50% of which was spent in counseling and coordination of care, reviewing test results, images and medication, and discussing the diagnosis, treatment plan and potential prognosis. This patient's care requiresreview of multiple databases, neurological assessment, discussion with family, other specialists and medical decision making of high complexity.  Rosalin Hawking, MD PhD Stroke Neurology 09/18/2020 3:51 PM    To contact Stroke Continuity provider, please refer to [email or web address] After hours, contact General Neurology

## 2020-09-18 NOTE — Progress Notes (Signed)
SLP Cancellation Note  Patient Details Name: [name] MRN: [id number] DOB: 07-Jan-1976   Cancelled treatment:       Reason Eval/Treat Not Completed: Patient at procedure or test/unavailable (Pt off unit at this time. SLP will f/u)  Tobie Poet I. Hardin Negus, [place], [place] Office number [phone] Pager [place] 09/18/2020, 9:56 AM

## 2020-09-18 NOTE — Progress Notes (Signed)
PT Cancellation Note  Patient Details Name: [name] MRN: [id number] DOB: 1976/10/08   Cancelled Treatment:    Reason Eval/Treat Not Completed: Patient at procedure or test/unavailable (pt leaving for vascular lab)   Hisako Bugh B Hyun Reali 09/18/2020, 8:45 AM  Bayard Males, PT Acute Rehabilitation Services Pager: [phone] Office: [phone]

## 2020-09-19 ENCOUNTER — Other Ambulatory Visit (HOSPITAL_COMMUNITY): Payer: Medicare Other

## 2020-09-19 DIAGNOSIS — I63449 Cerebral infarction due to embolism of unspecified cerebellar artery: Secondary | ICD-10-CM

## 2020-09-19 DIAGNOSIS — I342 Nonrheumatic mitral (valve) stenosis: Secondary | ICD-10-CM

## 2020-09-19 DIAGNOSIS — Z8673 Personal history of transient ischemic attack (TIA), and cerebral infarction without residual deficits: Secondary | ICD-10-CM

## 2020-09-19 DIAGNOSIS — I63542 Cerebral infarction due to unspecified occlusion or stenosis of left cerebellar artery: Secondary | ICD-10-CM

## 2020-09-19 DIAGNOSIS — I10 Essential (primary) hypertension: Secondary | ICD-10-CM

## 2020-09-19 DIAGNOSIS — I639 Cerebral infarction, unspecified: Secondary | ICD-10-CM | POA: Diagnosis not present

## 2020-09-19 LAB — ANTI-SMITH ANTIBODY: ENA SM Ab Ser-aCnc: <0.2 AI (ref 0.0–0.9)

## 2020-09-19 LAB — RENAL FUNCTION PANEL
Albumin: 2.6 g/dL — ABNORMAL LOW (ref 3.5–5.0)
Anion gap: 12 (ref 5–15)
BUN: 46 mg/dL — ABNORMAL HIGH (ref 6–20)
CO2: 25 mmol/L (ref 22–32)
Calcium: 8.1 mg/dL — ABNORMAL LOW (ref 8.9–10.3)
Chloride: 97 mmol/L — ABNORMAL LOW (ref 98–111)
Creatinine, Ser: 6.85 mg/dL — ABNORMAL HIGH (ref 0.44–1.00)
GFR, Estimated: 7 mL/min — ABNORMAL LOW (ref >60)
Glucose, Bld: 174 mg/dL — ABNORMAL HIGH (ref 70–99)
Phosphorus: 6.3 mg/dL — ABNORMAL HIGH (ref 2.5–4.6)
Potassium: 4.3 mmol/L (ref 3.5–5.1)
Sodium: 134 mmol/L — ABNORMAL LOW (ref 135–145)

## 2020-09-19 LAB — RPR: RPR Ser Ql: NONREACTIVE

## 2020-09-19 LAB — RHEUMATOID FACTOR: Rheumatoid fact SerPl-aCnc: <10 IU/mL (ref 0.0–13.9)

## 2020-09-19 LAB — ANCA TITERS
Atypical P-ANCA titer: <1:20 {titer}
C-ANCA: <1:20 {titer}
P-ANCA: <1:20 {titer}

## 2020-09-19 LAB — SICKLE CELL SCREEN: Sickle Cell Screen: NEGATIVE

## 2020-09-19 LAB — SJOGRENS SYNDROME-A EXTRACTABLE NUCLEAR ANTIBODY: SSA (Ro) (ENA) Antibody, IgG: >8 AI — ABNORMAL HIGH (ref 0.0–0.9)

## 2020-09-19 LAB — CBC
HCT: 33.6 % — ABNORMAL LOW (ref 36.0–46.0)
Hemoglobin: 10.4 g/dL — ABNORMAL LOW (ref 12.0–15.0)
MCH: 28.3 pg (ref 26.0–34.0)
MCHC: 31 g/dL (ref 30.0–36.0)
MCV: 91.6 fL (ref 80.0–100.0)
Platelets: 74 10*3/uL — ABNORMAL LOW (ref 150–400)
RBC: 3.67 MIL/uL — ABNORMAL LOW (ref 3.87–5.11)
RDW: 19.3 % — ABNORMAL HIGH (ref 11.5–15.5)
WBC: 10.9 10*3/uL — ABNORMAL HIGH (ref 4.0–10.5)
nRBC: 0 % (ref 0.0–0.2)

## 2020-09-19 LAB — ANTI-DNA ANTIBODY, DOUBLE-STRANDED: ds DNA Ab: 2 IU/mL (ref 0–9)

## 2020-09-19 LAB — SJOGRENS SYNDROME-B EXTRACTABLE NUCLEAR ANTIBODY: SSB (La) (ENA) Antibody, IgG: >8 AI — ABNORMAL HIGH (ref 0.0–0.9)

## 2020-09-19 LAB — HEPATITIS B SURFACE ANTIBODY, QUANTITATIVE: Hep B S AB Quant (Post): <3.1 m[IU]/mL — ABNORMAL LOW (ref >9.9)

## 2020-09-19 LAB — PATHOLOGIST SMEAR REVIEW

## 2020-09-19 MED ORDER — SODIUM CHLORIDE 0.9 % IV SOLN: INTRAVENOUS | Status: DC

## 2020-09-19 NOTE — Hospital Course (Addendum)
[name] is a 44 y.o. female who presented on 10/29 with dizziness accompanied with vomiting and one episode of diarrheal incontinence. 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 PMH is significant for lupus, ESRD, HTN, and HLD.   Acute Cerebellar CVA  Pt experienced dizziness throughout her admission. MR Brain showed a subcentimeter acute infarct involving the midline inferior vermis and an age indeterminate infarct of the posterior left frontal lobe. Pt on Aspirin 325 mg. DAPT held due to thrombocytopenia. Cardiology placed a loop recorder to r/o a fib as cause of strokes.  TEE ruled out Libman-Sacks endocarditis as a possible cause of her strokes.  Patient was evaluated by inpatient rehabilitation and approved for CIR.  Patient will continue rehab with inpatient rehabilitation.  Mitral Stenosis, LA dilation  Frequent PVCs seen on monitor here. Echocardiogram on 10/31 showed LVEF of 55 - 60%, severe concentric left ventricular hypertrophy, severely dilated LA, calcification of mitral valve leaflets with moderate regurgitation and severe mitral stenosis. Severe tricuspid regurgitation.Telemetry shows evidence of atrial tachyarrhythmias. TEE on 11/1 showed no evidence of mitral vegetations or LAA thrombi.   Lupus  Diagnosed with Lupus in 2005. Takes 10 mg prednisone. Follows with Dr. Elpidio Galea. States her Lupus is in remission. Home medications were continued.  HTN  Takes Diltiazem 240 mg  XR, Hydralazine 25 mg BID at home. Hydralazine d/c.Discharged on Diltiazem 120 mg and metoprolol 25 mg twice daily.  ESRD  Anemia of Chronic Disease  Lupus related ESRD.  Dialysis on T/Th/Sat. has right IJ tunneled catheter.  Home medications include Ren-a-vit 800 mg, Phos-lo. Home medications and dialysis continued.  Thrombocytopenia Plt 78 on admission.  Baseline 110s in April 2021, appears chronic.  95 on discharge.  Transaminitis  AST 78 and ALT 105 on admission, normal on 11/2. Denies alcohol use or  illicit drug use. Hep B non-reactive on 10/31.  Resolved on discharge.  Pre Diabetes ? No home diabetic medications, HgbA1c 7.2, goal < 7.0, blood glucose monitored, should follow up with PCP.  Hyperkalemia On 11/5 patient's potassium was 5.5.  Patient was dialyzed so this should have resolved.  Recommend recheck of BMP in week following discharge.  Other chronic problems including anxiety, insomnia and GERD, stable and treated with home medications

## 2020-09-19 NOTE — Plan of Care (Signed)
  Problem: Education: Goal: Knowledge of disease or condition will improve Outcome: Progressing Goal: Knowledge of secondary prevention will improve Outcome: Progressing Goal: Knowledge of patient specific risk factors addressed and post discharge goals established will improve Outcome: Progressing   Problem: Coping: Goal: Will verbalize positive feelings about self Outcome: Progressing Goal: Will identify appropriate support needs Outcome: Progressing   Problem: Intracerebral Hemorrhage Tissue Perfusion: Goal: Complications of Intracerebral Hemorrhage will be minimized Outcome: Progressing

## 2020-09-19 NOTE — Progress Notes (Addendum)
STROKE TEAM PROGRESS NOTE   INTERVAL HISTORY No family at the bedside.  Patient was on the phone with family members.  No acute event overnight, no complaints.  Given her TEE showing severe left atrial dilatation and mitral stenosis, as well as current and previous strokes, cardiology consult placed.  OBJECTIVE Vitals:   09/18/20 2034 09/19/20 0000 09/19/20 0350 09/19/20 1142  BP: (!) 144/96 (!) 147/98 (!) 149/92 (!) 152/107  Pulse: 100 99 100 95  Resp: 19   10  Temp: 98.2 F (36.8 C) 98.2 F (36.8 C) 98 F (36.7 C) 98.1 F (36.7 C)  TempSrc: Oral Oral Oral Oral  SpO2: 100% 99% 100% 100%  Weight:      Height:       CBC:  Recent Labs  Lab 09/17/20 1303 09/17/20 1311 09/18/20 0201 09/19/20 0242  WBC 12.1*   < > 9.4 10.9*  NEUTROABS 9.9*  --   --   --   HGB 10.9*   < > 11.3* 10.4*  HCT 35.1*   < > 35.7* 33.6*  MCV 91.9   < > 90.4 91.6  PLT 78*   < > 75* 74*   < > = values in this interval not displayed.   Basic Metabolic Panel:  Recent Labs  Lab 09/18/20 0201 09/19/20 0242  NA 135 134*  K 3.2* 4.3  CL 96* 97*  CO2 27 25  GLUCOSE 135* 174*  BUN 18 46*  CREATININE 4.24* 6.85*  CALCIUM 8.3* 8.1*  PHOS 3.5 6.3*   Lipid Panel:     Component Value Date/Time   CHOL 209 (H) 09/18/2020 0201   TRIG 135 09/18/2020 0201   HDL 77 09/18/2020 0201   CHOLHDL 2.7 09/18/2020 0201   VLDL 27 09/18/2020 0201   LDLCALC 105 (H) 09/18/2020 0201   HgbA1c:  Lab Results  Component Value Date   HGBA1C 7.2 (H) 09/18/2020   Urine Drug Screen: No results found for: LABOPIA, COCAINSCRNUR, LABBENZ, AMPHETMU, THCU, LABBARB  Alcohol Level     Component Value Date/Time   ETH <10 09/17/2020 1303    IMAGING  CT ANGIO HEAD W OR WO CONTRAST CT ANGIO NECK W OR WO CONTRAST 09/18/2020 IMPRESSION:  Small acute midline cerebellar infarct as seen on MRI. No new intracranial abnormality. No hemodynamically significant stenosis in the neck. No proximal intracranial vessel occlusion.  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   MR BRAIN WO CONTRAST 09/17/2020 IMPRESSION:  Motion degraded. Subcentimeter acute infarct involving the midline inferior vermis. Chronic left parietal infarct.   VAS [place] LOWER EXTREMITY VENOUS (DVT) 09/18/2020 Summary:  BILATERAL: - No evidence of deep vein thrombosis seen in the lower extremities, bilaterally. -  Transthoracic Echocardiogram  1. Left ventricular ejection fraction, by estimation, is 55 to 60%. The left ventricle has normal function. The left ventricle has no regional wall motion abnormalities. There is severe concentric left ventricular hypertrophy. Diastolic function indeterminant due to moderate-to-severe MAC.  2. Right ventricular systolic function is mildly reduced. The right ventricular size is normal. There is moderately elevated pulmonary artery systolic pressure.  3. Left atrial size was severely dilated.  4. Right atrial size was mildly dilated.  5. The mitral valve is degenerative. Moderate mitral valve regurgitation. Severe mitral stenosis with mean gradient 44mHg and MVA of 0.73cm2 by continuity equation at HR 88bpm. Moderate to severe mitral annular calcification.  6. Tricuspid valve regurgitation is severe.  7. The aortic valve is tricuspid. There is mild calcification of  the aortic valve. There is mild thickening of the aortic valve. Aortic valve regurgitation is mild. Mild to moderate aortic valve sclerosis/calcification is present, without any evidence of aortic stenosis.  8. The inferior vena cava is normal in size with <50% respiratory variability, suggesting right atrial pressure of 8 mmHg.   ECG - SR rate 89 BPM. (See cardiology reading for complete details)   PHYSICAL EXAM  Temp:  [98 F (36.7 C)-98.2 F (36.8 C)] 98.1 F (36.7 C) (11/01 1142) Pulse Rate:  [95-100] 95 (11/01 1142) Resp:  [10-19] 10 (11/01 1142) BP: (144-152)/(92-107) 152/107 (11/01  1142) SpO2:  [99 %-100 %] 100 % (11/01 1142)  General - Well nourished, well developed, in no apparent distress.  Ophthalmologic - fundi not visualized due to noncooperation.  Cardiovascular - Regular rhythm and rate.  Mental Status -  Level of arousal and orientation to time, place, and person were intact. Language including expression, naming, repetition, comprehension was assessed and found intact. Fund of Knowledge was assessed and was intact.  Cranial Nerves II - XII - II - Visual field intact OU. III, IV, VI - Extraocular movements intact. V - Facial sensation intact bilaterally. VII - Facial movement intact bilaterally. VIII - Hearing & vestibular intact bilaterally. X - Palate elevates symmetrically. XI - Chin turning & shoulder shrug intact bilaterally. XII - Tongue protrusion intact.  Motor Strength - The patient's strength was normal in all extremities and pronator drift was absent.  Bulk was normal and fasciculations were absent.   Motor Tone - Muscle tone was assessed at the neck and appendages and was normal.  Reflexes - The patient's reflexes were symmetrical in all extremities and she had no pathological reflexes.  Sensory - Light touch, temperature/pinprick were assessed and were symmetrical.    Coordination - The patient had grossly normal movements in the hands and feet with no ataxia or dysmetria.  Tremor was absent.  Gait and Station - deferred.   ASSESSMENT/PLAN [name] is a 44 y.o. female with history of lupus, ESRD (TThSa HD), HTN (controlled), secondary hyperparathyroidism, depression, chronic tremor, extremity edema and anxiety who presents today with a ~12 hour history of room-spinning dizziness accompanied by nausea, vomiting, and stool incontinence. [name] states that she has experienced dizzy spells in the past but they were self-limiting and resolved rather quickly. She did not receive IV t-PA due to late presentation (>4.5 hours  from time of onset).   Stroke: acute cerebellar vermis small infarct.  Given history of lupus, chronic left parietal infarct, concerning for embolic stroke this time - source unknown.   MRI head - Subcentimeter acute infarct involving the midline inferior vermis. Chronic left parietal infarct.   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   2D Echo - EF 55 to 60%, severe left atrial dilatation, severe mitral stenosis  LE venous Doppler negative for DVT  TEE pending Tues 11/2 at 1300  Cardiology recommends OP cardiac monitor to rule out arrhythmias  Lacey Jensen Virus 2  - negative  LDL - 105  HgbA1c - 7.2  VTE prophylaxis - SCDs  No antithrombotic prior to admission, now on aspirin 81 mg daily given thrombocytopenia.  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 Long-term BP goal normotensive  Hyperlipidemia  Home Lipid lowering medication: none   LDL 105, goal < 70  Current lipid lowering medication: Lipitor 40 mg daily   transaminitis AST / ALT 78/105-> ALT 84 -> pending   Continue statin at discharge  Pre Diabetes ?  Home diabetic meds: none   HgbA1c 7.2, goal < 7.0  SSI  CBG monitoring  PCP follow-up  Thrombocytopenia  platelets - 78->75->74  Aspirin 81 only  Avoid DAPT for now  Other Stroke Risk Factors    History of stroke, left parietal chronic infarct on imaging  Other Active Problems  Hypokalemia - 3.2 -> 4.3 - resolved  ESRD on HD (Tue - Thurs - Sat)   Anemia - Hgb - 11.3->10.4 (chronic disease)  Mild Leukocytosis - WBCs - 9.4->10.9 - prednisone - afebrile    Anxiety - on paroxetine  Insomnia - on trazodone  GERD - on PPI  Hospital day # 2   Rosalin Hawking, MD PhD Stroke Neurology 09/19/2020 4:56 PM    To contact Stroke Continuity provider, please refer to [email or web address] After hours, contact General Neurology

## 2020-09-19 NOTE — Evaluation (Signed)
Occupational Therapy Evaluation Patient Details Name: [name] MRN: [id number] DOB: 05-Jun-1976 Today's Date: 09/19/2020    History of Present Illness 44 yo admitted with dizziness with MRI demonstrating acute cerebellar infarct with chronic left parietal infarct. PMhx: ESRD, SLE, anemia, thrombocytopenia, HTN, HLD   Clinical Impression   PT admitted with CIR. Pt currently with functional limitiations due to the deficits listed below (see OT problem list). Pt reports baseline MODI and driving to HD TTS weekly. Pt reports having PRN (A) from neighbor but otherwise does everything alone. Pt reports having family in the [place] area. Pt currently required RW min guard (A) for sink level task due to balance deficits.  Pt will benefit from skilled OT to increase their independence and safety with adls and balance to allow discharge CIR.     Follow Up Recommendations  CIR    Equipment Recommendations  Other (comment) (RW)    Recommendations for Other Services Rehab consult     Precautions / Restrictions Precautions Precautions: Fall Restrictions Weight Bearing Restrictions: No      Mobility Bed Mobility Overal bed mobility: Modified Independent Bed Mobility: Supine to Sit     Supine to sit: Modified independent (Device/Increase time);HOB elevated     General bed mobility comments: Extra time, but able to come to sit EOB with HOB elevated safely.    Transfers Overall transfer level: Needs assistance Equipment used: None Transfers: Sit to/from Stand Sit to Stand: Min guard              Balance Overall balance assessment: Needs assistance Sitting-balance support: No upper extremity supported;Feet supported Sitting balance-Leahy Scale: Good Sitting balance - Comments: Able to maintain static sitting balance EOB without trunk sway or LOB.   Standing balance support: Single extremity supported Standing balance-Leahy Scale: Poor Standing balance comment: 1 UE  support in standing with mild trunk sway/unsteadiness noted.                           ADL either performed or assessed with clinical judgement   ADL Overall ADL's : Needs assistance/impaired     Grooming: Wash/dry face;Wash/dry hands;Oral care;Standing;Min guard   Upper Body Bathing: Min guard           Lower Body Dressing: Supervision/safety;Bed level Lower Body Dressing Details (indicate cue type and reason): able to don socks long sitting Toilet Transfer: Min guard;RW           Functional mobility during ADLs: Min guard;Rolling walker General ADL Comments: pt asking when she can progress past RW use but also reports unsteady at this time time without it     Vision Baseline Vision/History: Wears glasses Wears Glasses: At all times       Perception     Praxis      Pertinent Vitals/Pain Pain Assessment: No/denies pain     Hand Dominance Right   Extremity/Trunk Assessment Upper Extremity Assessment Upper Extremity Assessment: Generalized weakness;RUE deficits/detail RUE Deficits / Details: noted to have tremor pt reports is increased   Lower Extremity Assessment Lower Extremity Assessment: Defer to PT evaluation   Cervical / Trunk Assessment Cervical / Trunk Assessment: Normal   Communication Communication Communication: No difficulties   Cognition Arousal/Alertness: Awake/alert Behavior During Therapy: WFL for tasks assessed/performed Overall Cognitive Status: Within Functional Limits for tasks assessed  General Comments       Exercises     Shoulder Instructions      Home Living Family/patient expects to be discharged to:: Private residence Living Arrangements: Alone Available Help at Discharge: Family;Neighbor Type of Home: Apartment Home Access: Stairs to enter;Elevator     Home Layout: One level     Bathroom Shower/Tub: Teacher, early years/pre: Standard      Home Equipment: Cane - single point;Grab bars - toilet;Grab bars - tub/shower;Hand held shower head   Additional Comments: no animals      Prior Functioning/Environment Level of Independence: Independent with assistive device(s)        Comments: transportation via a neighbor        OT Problem List: Decreased strength;Decreased activity tolerance;Impaired balance (sitting and/or standing);Decreased safety awareness;Decreased knowledge of use of DME or AE;Decreased knowledge of precautions;Decreased cognition      OT Treatment/Interventions: Self-care/ADL training;Therapeutic exercise;Neuromuscular education;Energy conservation;DME and/or AE instruction;Manual therapy;Therapeutic activities;Patient/family education;Balance training    OT Goals(Current goals can be found in the care plan section) Acute Rehab OT Goals Patient Stated Goal: to be abel to go home and drive to HD OT Goal Formulation: With patient Time For Goal Achievement: 10/03/20 Potential to Achieve Goals: Good  OT Frequency: Min 2X/week   Barriers to D/C: Decreased caregiver support  reports only have neighbor to (A) but then reports later that she moved to [place] to be closer to family. does not offer any family resources when asked       Co-evaluation              AM-PAC OT "6 Clicks" Daily Activity     Outcome Measure Help from another person eating meals?: A Little Help from another person taking care of personal grooming?: A Little Help from another person toileting, which includes using toliet, bedpan, or urinal?: A Little Help from another person bathing (including washing, rinsing, drying)?: A Little Help from another person to put on and taking off regular upper body clothing?: A Little Help from another person to put on and taking off regular lower body clothing?: A Little 6 Click Score: 18   End of Session Equipment Utilized During Treatment: Gait belt;Rolling walker Nurse Communication:  Mobility status;Precautions  Activity Tolerance: Patient tolerated treatment well Patient left: in bed;with call bell/phone within reach;with bed alarm set (pending test)  OT Visit Diagnosis: Unsteadiness on feet (R26.81);Muscle weakness (generalized) (M62.81)                Time: 9480-1655 OT Time Calculation (min): 19 min Charges:  OT General Charges $OT Visit: 1 Visit OT Evaluation $OT Eval Moderate Complexity: 1 Mod   Brynn, OTR/L  Acute Rehabilitation Services Pager: [phone] Office: [phone] .   Jeri Modena 09/19/2020, 4:03 PM

## 2020-09-19 NOTE — Progress Notes (Signed)
Physical Therapy Treatment Patient Details Name: [name] MRN: [id number] DOB: [birth year] Today's Date: 09/19/2020    History of Present Illness 44 yo admitted with dizziness with MRI demonstrating acute cerebellar infarct with chronic left parietal infarct. PMhx: ESRD, SLE, anemia, thrombocytopenia, HTN, HLD    PT Comments    Pt making significant progress towards her goals as she was mod I for bed mobility with HOB elevated this date. However, she continues to report dizziness with changes in position which was confirmed with increased unsteadiness noted during gait when she would change head positions. She reports and displays anxiety with gait, but appeared to demonstrate improved steadiness as distance and possibly confidence improved. Practiced proper sequence and usage of SPC this date, with no LOB noted during gait. Changing recommendations to SNF rather than CIR due to her increased level of independence and safety but continued need to address her deficits to maximize her safety prior to returning home. Will continue to follow acutely.    Follow Up Recommendations  SNF;Supervision for mobility/OOB     Equipment Recommendations  Other (comment) (TBD)    Recommendations for Other Services OT consult     Precautions / Restrictions Precautions Precautions: Fall Restrictions Weight Bearing Restrictions: No    Mobility  Bed Mobility Overal bed mobility: Modified Independent Bed Mobility: Supine to Sit     Supine to sit: Modified independent (Device/Increase time);HOB elevated     General bed mobility comments: Extra time, but able to come to sit EOB with HOB elevated safely.  Transfers Overall transfer level: Needs assistance Equipment used: None Transfers: Sit to/from Stand Sit to Stand: Min guard         General transfer comment: VCs for hand placement on bed, coming to stand in appropriate amount of time but slight unsteadiness noted. Pt reported  anxiety and dizziness with changes in position.  Ambulation/Gait Ambulation/Gait assistance: Min guard Gait Distance (Feet): 250 Feet Assistive device: 1 person hand held assist;Straight cane Gait Pattern/deviations: Step-through pattern;Decreased step length - left Gait velocity: decreased Gait velocity interpretation: 1.31 - 2.62 ft/sec, indicative of limited community ambulator General Gait Details: Initially requiring HHA for steadying due to reported dizziness and anxiety, but progressed to [facility] in R hand performing 2-point gait pattern with improved stability noted with increased distance. Cued pt to inc L step length, with success. Increased unsteadiness noted with changes in head position.   Stairs             Wheelchair Mobility    Modified Rankin (Stroke Patients Only) Modified Rankin (Stroke Patients Only) Pre-Morbid Rankin Score: Slight disability Modified Rankin: Moderately severe disability     Balance Overall balance assessment: Needs assistance Sitting-balance support: No upper extremity supported;Feet supported Sitting balance-Leahy Scale: Good Sitting balance - Comments: Able to maintain static sitting balance EOB without trunk sway or LOB.   Standing balance support: Single extremity supported Standing balance-Leahy Scale: Poor Standing balance comment: 1 UE support in standing with mild trunk sway/unsteadiness noted.                            Cognition Arousal/Alertness: Awake/alert Behavior During Therapy: WFL for tasks assessed/performed Overall Cognitive Status: Within Functional Limits for tasks assessed                                        Exercises  General Comments        Pertinent Vitals/Pain Pain Assessment: No/denies pain    Home Living                      Prior Function            PT Goals (current goals can now be found in the care plan section) Acute Rehab PT Goals Patient  Stated Goal: to be able to cross the parking lot to get home PT Goal Formulation: With patient Time For Goal Achievement: 10/02/20 Potential to Achieve Goals: Good Progress towards PT goals: Progressing toward goals    Frequency    Min 4X/week      PT Plan Discharge plan needs to be updated    Co-evaluation              AM-PAC PT "6 Clicks" Mobility   Outcome Measure  Help needed turning from your back to your side while in a flat bed without using bedrails?: A Little Help needed moving from lying on your back to sitting on the side of a flat bed without using bedrails?: A Little Help needed moving to and from a bed to a chair (including a wheelchair)?: A Little Help needed standing up from a chair using your arms (e.g., wheelchair or bedside chair)?: A Little Help needed to walk in hospital room?: A Little Help needed climbing 3-5 steps with a railing? : A Lot 6 Click Score: 17    End of Session Equipment Utilized During Treatment: Gait belt Activity Tolerance: Patient tolerated treatment well Patient left: in chair;with call bell/phone within reach   PT Visit Diagnosis: Other abnormalities of gait and mobility (R26.89);Difficulty in walking, not elsewhere classified (R26.2);Other symptoms and signs involving the nervous system (R29.898);Unsteadiness on feet (R26.81)     Time: 6568-1275 PT Time Calculation (min) (ACUTE ONLY): 20 min  Charges:  $Gait Training: 8-22 mins                     Moishe Spice, PT, DPT Acute Rehabilitation Services  Pager: [phone] Office: [place] 09/19/2020, 3:54 PM

## 2020-09-19 NOTE — H&P (View-Only) (Signed)
 Cardiology Consultation:   Patient ID: [name]; [id number]; 06/24/1976   Admit date: 09/17/2020 Date of Consult: 09/19/2020  Primary Care Provider: Rucker, Mary Y, MD Primary Cardiologist: [name], Suresh, MD Primary Electrophysiologist: none   Patient Profile:   [name] is a 44 y.o. female with a hx of ESRD (TTS), HTN, HLD, SLE who is being seen today for the evaluation of severe mitral stenosis at the request of Dr. Pray.  History of Present Illness:   [name] presented to [facility] on 10/30 with acute onset dizziness while laying in bed with associated vomiting and diarrhea incontinence. She initially presented to an outside hospital where she was diagnosed with "vertigo". She was prescribed meclizine and set up with an outpatient MRI before being discharged. Patient's dizziness progressed, which prompted her to got to [facility] for further evaluation. Neurology was consulted and MRI/CTA of the head showed small acute midline cerebellar vermis infarct and chronic left parietal infarct. Echo was performed due to underlying concern for embolic etiology, which showed severe left atrial dilation and severe mitral stenosis in the setting of significant annular calcifications.    Past Medical History:  Diagnosis Date  . Allergy   . Anemia   . Anxiety   . Arthritis   . Avascular necrosis of bone (HCC)    left tibial talus due to chronic prednisone use  . Cerebellar stroke (HCC)   . Depression   . Dyspnea    too much  fluid  . ESRD (end stage renal disease) on dialysis (HCC)    tthsat [facility] [place]  . GERD (gastroesophageal reflux disease)   . Headache(784.0)   . History of high blood pressure   . Hyperlipemia   . Hypertension   . Lupus (HCC)   . Pneumonia   . Secondary hyperparathyroidism (of renal origin)     Past Surgical History:  Procedure Laterality Date  . A/V FISTULAGRAM Left 12/02/2017   Procedure: A/V FISTULAGRAM - Left upper;  Surgeon: Dickson,  Christopher S, MD;  [facility];  Service: Cardiovascular;  Laterality: Left;  . AV FISTULA PLACEMENT Left   . AV FISTULA PLACEMENT Right 01/26/2014   Procedure: ARTERIOVENOUS (AV) FISTULA CREATION; ultrasound guided;  Surgeon: Brian L Chen, MD;  [facility];  Service: Vascular;  Laterality: Right;  . AV FISTULA PLACEMENT Right 04/25/2020   Procedure: RIGHT UPPER ARM  BASILIC VEIN ARTERIOVENOUS (AV) FISTULA;  Surgeon: Clark, Christopher J, MD;  [facility];  Service: Vascular;  Laterality: Right;  . BASCILIC VEIN TRANSPOSITION Right 07/01/2020   Procedure: SECOND STAGE BASCILIC VEIN TRANSPOSITION RIGHT UPPER EXTREMITY;  Surgeon: Clark, Christopher J, MD;  [facility];  Service: Vascular;  Laterality: Right;  . ESOPHAGOGASTRODUODENOSCOPY N/A 03/17/2016   Procedure: ESOPHAGOGASTRODUODENOSCOPY (EGD);  Surgeon: John N Perry, MD;  [facility]: [facility];  Service: Endoscopy;  Laterality: N/A;  . FISTULOGRAM Left 09/24/2019   Procedure: FISTULOGRAM LEFT ARM;  Surgeon: Clark, Christopher J, MD;  [facility];  Service: Vascular;  Laterality: Left;  . HEMATOMA EVACUATION Left 10/09/2013   Procedure: EVACUATION HEMATOMA;  Surgeon: Christopher S Dickson, MD;  [facility];  Service: Vascular;  Laterality: Left;  . HEMATOMA EVACUATION Left 09/24/2019   Procedure: EVACUATION HEMATOMA  LEFT ARM;  Surgeon: Clark, Christopher J, MD;  [facility];  Service: Vascular;  Laterality: Left;  . INCISION AND DRAINAGE ABSCESS     gluteal  . INSERTION OF DIALYSIS CATHETER N/A 10/09/2013   Procedure: INSERTION   OF DIALYSIS CATHETER; ULTRASOUND GUIDED;  Surgeon: Angelia Mould, MD;  Location: [place];  Service: Vascular;  Laterality: N/A;  . INSERTION OF DIALYSIS CATHETER Left 09/24/2019   Procedure: INSERTION OF TUNNELLED DIALYSIS CATHETER - PALINDROME 15FR X 28CM;  Surgeon: [name], MD;  Location: [place];  Service: Vascular;  Laterality: Left;  . INSERTION OF DIALYSIS  CATHETER Right 03/15/2020   Procedure: Insertion Of Dialysis Catheter;  Surgeon: Angelia Mould, MD;  Location: [place];  Service: Cardiovascular;  Laterality: Right;  . PERIPHERAL VASCULAR BALLOON ANGIOPLASTY Left 12/02/2017   Procedure: PERIPHERAL VASCULAR BALLOON ANGIOPLASTY;  Surgeon: Angelia Mould, MD;  Location: [place] CV [facility];  Service: Cardiovascular;  Laterality: Left;  . REVISION OF ARTERIOVENOUS GORETEX GRAFT Left 04/03/2019   Procedure: REVISION OF ARTERIOVENOUS GORETEX GRAFT PSEUDOANEURYSM;  Surgeon: Angelia Mould, MD;  Location: [place];  Service: Vascular;  Laterality: Left;  . SHUNTOGRAM N/A 01/07/2014   Procedure: fistulogram with possibe venoplasty left upper arm avg;  Surgeon: Angelia Mould, MD;  Location: [facility] CATH LAB;  Service: Cardiovascular;  Laterality: N/A;  . THROMBECTOMY AND REVISION OF ARTERIOVENTOUS (AV) GORETEX  GRAFT Left 03/15/2020   Procedure: ATTEMPTED THROMBECTOMY OF LEFT ARM ARTERIOVENTOUS (AV) GORETEX  GRAFT;  Surgeon: Angelia Mould, MD;  Location: [place];  Service: Cardiovascular;  Laterality: Left;  . tibiocalcaneal fusion  left Left   . ULTRASOUND GUIDANCE FOR VASCULAR ACCESS  09/24/2019   Procedure: Ultrasound Guidance For Vascular Access;  Surgeon: [name], MD;  Location: [place];  Service: Vascular;;     Home Medications:  Prior to Admission medications   Medication Sig Start Date End Date Taking? Authorizing Provider  acetaminophen (TYLENOL) 500 MG tablet Take 1,000 mg by mouth every 6 (six) hours as needed for moderate pain or headache.    Yes [provider]  b complex-vitamin c-folic acid (NEPHRO-VITE) 0.8 MG TABS tablet Take 1 tablet by mouth daily at 12 noon.   Yes [provider]  calcium acetate, Phos Binder, (PHOSLYRA) 667 MG/5ML SOLN Take 2,001 mg by mouth See admin instructions. Take 15 mls (2001 mg) by mouth with each meal and snack - up to 9 times daily   Yes [provider]  Carboxymethylcellul-Glycerin (LUBRICATING EYE DROPS OP) Place 1 drop into both eyes daily as needed (dry eyes).   Yes [provider]  diltiazem (TIAZAC) 240 MG 24 hr capsule Take 240 mg by mouth at bedtime.    Yes [provider]  hydrALAZINE (APRESOLINE) 25 MG tablet Take 25 mg by mouth in the morning and at bedtime.   Yes [provider]  pantoprazole (PROTONIX) 40 MG tablet Take 1 tablet (40 mg total) by mouth daily. 12/04/17  Yes Hoyt Koch, MD  PARoxetine (PAXIL) 40 MG tablet Take 40 mg by mouth daily.   Yes [provider]  predniSONE (DELTASONE) 10 MG tablet Take 1 tablet (10 mg total) by mouth daily with breakfast. 08/29/17  Yes Hoyt Koch, MD  traZODone (DESYREL) 100 MG tablet Take 100 mg by mouth at bedtime.   Yes [provider]  clopidogrel (PLAVIX) 75 MG tablet Take 75 mg by mouth daily. 09/17/20   [provider]  HYDROcodone-acetaminophen (NORCO/VICODIN) 5-325 MG tablet Take 1 tablet by mouth every 6 (six) hours as needed for moderate pain. Patient not taking: Reported on 09/14/2020 07/01/20 07/01/21  Ulyses Amor, PA-C  meclizine (ANTIVERT) 25 MG tablet Take 25 mg by  mouth 3 (three) times daily as needed. 09/17/20   [provider]    Inpatient Medications: Scheduled Meds: . aspirin EC  81 mg Oral Daily  . atorvastatin  40 mg Oral Daily  . calcium acetate (Phos Binder)  1,334 mg Oral TID WC  . Chlorhexidine Gluconate Cloth  6 each Topical Daily  . pantoprazole  40 mg Oral Daily  . PARoxetine  40 mg Oral Daily  . predniSONE  10 mg Oral Q breakfast  . traZODone  100 mg Oral QHS   Continuous Infusions: . sodium chloride    . sodium chloride     PRN Meds: sodium chloride, sodium chloride, acetaminophen **OR** acetaminophen, alteplase, heparin, heparin, lidocaine (PF), lidocaine-prilocaine, pentafluoroprop-tetrafluoroeth, polyvinyl alcohol  Allergies:    Allergies    Allergen Reactions  . Amlodipine Hives and Swelling  . Sulfa Antibiotics Hives and Itching  . Vancomycin Hives and Itching  . Venlafaxine Other (See Comments)    Emotional    Social History:   Social History   Socioeconomic History  . Marital status: Single    Spouse name: none  . Number of children: 0  . Years of education: 14  . Highest education level: Some college, no degree  Occupational History  . Occupation: unemployed    Comment: disability  Tobacco Use  . Smoking status: Never Smoker  . Smokeless tobacco: Never Used  Vaping Use  . Vaping Use: Never used  Substance and Sexual Activity  . Alcohol use: No  . Drug use: No  . Sexual activity: Not Currently    Birth control/protection: None  Other Topics Concern  . Not on file  Social History Narrative   Lives alone   Moved to [place] 4 months ago to be closer to family   Previously lived in [place] , more active   Very close to sister tonya   Social Determinants of Health   Financial Resource Strain:   . Difficulty of Paying Living Expenses: Not on file  Food Insecurity:   . Worried About Running Out of Food in the Last Year: Not on file  . Ran Out of Food in the Last Year: Not on file  Transportation Needs:   . Lack of Transportation (Medical): Not on file  . Lack of Transportation (Non-Medical): Not on file  Physical Activity:   . Days of Exercise per Week: Not on file  . Minutes of Exercise per Session: Not on file  Stress:   . Feeling of Stress : Not on file  Social Connections:   . Frequency of Communication with Friends and Family: Not on file  . Frequency of Social Gatherings with Friends and Family: Not on file  . Attends Religious Services: Not on file  . Active Member of Clubs or Organizations: Not on file  . Attends Club or Organization Meetings: Not on file  . Marital Status: Not on file  Intimate Partner Violence:   . Fear of Current or Ex-Partner: Not on file  . Emotionally Abused: Not  on file  . Physically Abused: Not on file  . Sexually Abused: Not on file    Family History:   Family History  Problem Relation Age of Onset  . Asthma Sister   . Hypertension Mother   . Heart attack Mother 42       died at 42  . COPD Maternal Uncle        prostate  . Mental illness Neg Hx      ROS:    Please see the history of present illness.  Review of Systems  All other systems reviewed and are negative.  All other ROS reviewed and negative.     Physical Exam/Data:   Vitals:   09/18/20 2034 09/19/20 0000 09/19/20 0350 09/19/20 1142  BP: (!) 144/96 (!) 147/98 (!) 149/92 (!) 152/107  Pulse: 100 99 100 95  Resp: 19   10  Temp: 98.2 F (36.8 C) 98.2 F (36.8 C) 98 F (36.7 C) 98.1 F (36.7 C)  TempSrc: Oral Oral Oral Oral  SpO2: 100% 99% 100% 100%  Weight:      Height:        Intake/Output Summary (Last 24 hours) at 09/19/2020 1251 Last data filed at 09/18/2020 2100 Gross per 24 hour  Intake 100 ml  Output --  Net 100 ml   Filed Weights   09/17/20 0813 09/17/20 1940  Weight: 60.3 kg 61.5 kg   Body mass index is 23.27 kg/m.  General:  Well nourished, well developed, in no acute distress HEENT: normal Lymph: no adenopathy Neck: no JVD Endocrine:  No thryomegaly Vascular: No carotid bruits; FA pulses 2+ bilaterally without bruits  Cardiac:  Normal  S1,  Prominent S2 at left sternal boarder; RRR; no murmur  Lungs:  clear to auscultation bilaterally, no wheezing, rhonchi or rales  Abd: soft, nontender, no hepatomegaly  Ext: tace edema with stasis dermatitis present on bilateral LE Musculoskeletal:  No deformities, BUE and BLE strength normal and equal Skin: warm and dry  Neuro:  CNs 2-12 intact, no focal abnormalities noted Psych:  Normal affect   EKG:  The EKG was personally reviewed and demonstrates:  Sinus rhythm with right bundle branch block Telemetry:  Telemetry was personally reviewed and demonstrates:  Sinus tachycardia  Relevant CV  Studies:  Echocardiogram: (09/19/2020) 1. Left ventricular ejection fraction, by estimation, is 55 to 60%. The  left ventricle has normal function. The left ventricle has no regional  wall motion abnormalities. There is severe concentric left ventricular  hypertrophy. Diastolic function  indeterminant due to moderate-to-severe MAC.  2. Right ventricular systolic function is mildly reduced. The right  ventricular size is normal. There is moderately elevated pulmonary artery  systolic pressure.  3. Left atrial size was severely dilated.  4. Right atrial size was mildly dilated.  5. The mitral valve is degenerative. Moderate mitral valve regurgitation.  Severe mitral stenosis with mean gradient 50mHg and MVA of 0.73cm2 by  continuity equation at HR 88bpm. Moderate to severe mitral annular  calcification.  6. Tricuspid valve regurgitation is severe.  7. The aortic valve is tricuspid. There is mild calcification of the  aortic valve. There is mild thickening of the aortic valve. Aortic valve  regurgitation is mild. Mild to moderate aortic valve  sclerosis/calcification is present, without any evidence  of aortic stenosis.  8. The inferior vena cava is normal in size with <50% respiratory  variability, suggesting right atrial pressure of 8 mmHg.   Comparison(s): Compared to prior study on 11/2017, there is now severe  calcific mitral stenosis and severe tricuspid regurgitation.   Laboratory Data:  Chemistry Recent Labs  Lab 09/17/20 1303 09/17/20 1303 09/17/20 1311 09/18/20 0201 09/19/20 0242  NA 136   < > 134* 135 134*  K 5.3*   < > 4.9 3.2* 4.3  CL 98   < > 97* 96* 97*  CO2 24  --   --  27 25  GLUCOSE 170*   < > 166* 135* 174*  BUN 56*   < > 66* 18 46*  CREATININE 8.21*   < > 8.60* 4.24* 6.85*  CALCIUM 8.9  --   --  8.3* 8.1*  GFRNONAA 6*  --   --  13* 7*  ANIONGAP 14  --   --  12 12   < > = values in this interval not displayed.    Recent Labs  Lab  09/17/20 1303 09/18/20 0201 09/19/20 0242  PROT 5.7*  --   --   ALBUMIN 3.1* 3.0* 2.6*  AST 78*  --   --   ALT 105* 84*  --   ALKPHOS 123  --   --   BILITOT 0.9  --   --    Hematology Recent Labs  Lab 09/17/20 1303 09/17/20 1303 09/17/20 1311 09/18/20 0201 09/19/20 0242  WBC 12.1*  --   --  9.4 10.9*  RBC 3.82*  --   --  3.95 3.67*  HGB 10.9*   < > 12.6 11.3* 10.4*  HCT 35.1*   < > 37.0 35.7* 33.6*  MCV 91.9  --   --  90.4 91.6  MCH 28.5  --   --  28.6 28.3  MCHC 31.1  --   --  31.7 31.0  RDW 19.3*  --   --  19.3* 19.3*  PLT 78*  --   --  75* 74*   < > = values in this interval not displayed.   Cardiac EnzymesNo results for input(s): TROPONINI in the last 168 hours. No results for input(s): TROPIPOC in the last 168 hours.  BNPNo results for input(s): BNP, PROBNP in the last 168 hours.  DDimer No results for input(s): DDIMER in the last 168 hours.  Radiology/Studies:  CT ANGIO HEAD W OR WO CONTRAST  Result Date: 09/18/2020 CLINICAL DATA:  Small cerebellar stroke on MRI EXAM: CT ANGIOGRAPHY HEAD AND NECK TECHNIQUE: Multidetector CT imaging of the head and neck was performed using the standard protocol during bolus administration of intravenous contrast. Multiplanar CT image reconstructions and MIPs were obtained to evaluate the vascular anatomy. Carotid stenosis measurements (when applicable) are obtained utilizing NASCET criteria, using the distal internal carotid diameter as the denominator. CONTRAST:  75mL OMNIPAQUE IOHEXOL 350 MG/ML SOLN COMPARISON:  None. FINDINGS: CT HEAD Brain: There is a small area hypoattenuation corresponding to area of midline inferior cerebellar infarction on prior MRI. There is no acute intracranial hemorrhage or mass effect. No acute appearing loss of gray-white differentiation. Chronic left parietal infarct. There is no extra-axial fluid collection. Ventricles and sulci are within normal limits in size and configuration. Vascular: Better evaluated  on CTA portion. Skull: Calvarium is unremarkable. Sinuses/Orbits: Bilateral proptosis.  No acute finding. Other: None. Review of the MIP images confirms the above findings CTA NECK Aortic arch: Great vessel origins are patent. There is direct origin of the left vertebral artery from the arch. Right carotid system: Patent. Minimal calcified plaque at the ICA origin without measurable stenosis. Left carotid system: Patent. Mild calcified plaque at the ICA origin without measurable stenosis. Vertebral arteries: Patent.  Left vertebral artery is dominant. Skeleton: No significant abnormality. Other neck: No mass or adenopathy. Upper chest: No apical lung mass. Right IJ approach catheter enters the SVC. Review of the MIP images confirms the above findings CTA HEAD Anterior circulation: Intracranial internal carotid arteries are patent with calcified plaque. Moderate stenosis of the supraclinoid portions bilaterally. Anterior and middle cerebral arteries are patent. Posterior circulation: Intracranial vertebral arteries are patent.   Right vertebral artery becomes diminutive after PICA origin. Both PICA origins are patent. Basilar artery is patent. Superior cerebellar artery origins are patent. Posterior cerebral arteries are patent. Bilateral posterior communicating arteries are present with fetal origin of the left posterior cerebral artery. Venous sinuses: Patent as allowed by contrast bolus timing. Review of the MIP images confirms the above findings IMPRESSION: Small acute midline cerebellar infarct as seen on MRI. No new intracranial abnormality. No hemodynamically significant stenosis in the neck. No proximal intracranial vessel occlusion. 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 Electronically Signed   By: Macy Mis M.D.   On: 09/18/2020 08:01   CT ANGIO NECK W OR WO CONTRAST  Result Date: 09/18/2020 CLINICAL DATA:  Small cerebellar stroke on MRI EXAM: CT ANGIOGRAPHY  HEAD AND NECK TECHNIQUE: Multidetector CT imaging of the head and neck was performed using the standard protocol during bolus administration of intravenous contrast. Multiplanar CT image reconstructions and MIPs were obtained to evaluate the vascular anatomy. Carotid stenosis measurements (when applicable) are obtained utilizing NASCET criteria, using the distal internal carotid diameter as the denominator. CONTRAST:  40m OMNIPAQUE IOHEXOL 350 MG/ML SOLN COMPARISON:  None. FINDINGS: CT HEAD Brain: There is a small area hypoattenuation corresponding to area of midline inferior cerebellar infarction on prior MRI. There is no acute intracranial hemorrhage or mass effect. No acute appearing loss of gray-white differentiation. Chronic left parietal infarct. There is no extra-axial fluid collection. Ventricles and sulci are within normal limits in size and configuration. Vascular: Better evaluated on CTA portion. Skull: Calvarium is unremarkable. Sinuses/Orbits: Bilateral proptosis.  No acute finding. Other: None. Review of the MIP images confirms the above findings CTA NECK Aortic arch: Great vessel origins are patent. There is direct origin of the left vertebral artery from the arch. Right carotid system: Patent. Minimal calcified plaque at the ICA origin without measurable stenosis. Left carotid system: Patent. Mild calcified plaque at the ICA origin without measurable stenosis. Vertebral arteries: Patent.  Left vertebral artery is dominant. Skeleton: No significant abnormality. Other neck: No mass or adenopathy. Upper chest: No apical lung mass. Right IJ approach catheter enters the SVC. Review of the MIP images confirms the above findings CTA HEAD Anterior circulation: Intracranial internal carotid arteries are patent with calcified plaque. Moderate stenosis of the supraclinoid portions bilaterally. Anterior and middle cerebral arteries are patent. Posterior circulation: Intracranial vertebral arteries are patent.  Right vertebral artery becomes diminutive after PICA origin. Both PICA origins are patent. Basilar artery is patent. Superior cerebellar artery origins are patent. Posterior cerebral arteries are patent. Bilateral posterior communicating arteries are present with fetal origin of the left posterior cerebral artery. Venous sinuses: Patent as allowed by contrast bolus timing. Review of the MIP images confirms the above findings IMPRESSION: Small acute midline cerebellar infarct as seen on MRI. No new intracranial abnormality. No hemodynamically significant stenosis in the neck. No proximal intracranial vessel occlusion. 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 Electronically Signed   By: PMacy MisM.D.   On: 09/18/2020 08:01   MR BRAIN WO CONTRAST  Result Date: 09/17/2020 CLINICAL DATA:  Dizziness EXAM: MRI HEAD WITHOUT CONTRAST TECHNIQUE: Multiplanar, multiecho pulse sequences of the brain and surrounding structures were obtained without intravenous contrast. COMPARISON:  None. FINDINGS: Motion artifact is present. Brain: There is an 8 mm focus of mildly reduced diffusion involving the midline inferior vermis. Chronic left parietal infarct with chronic blood products. There is no intracranial mass, mass effect, or edema. There  is no hydrocephalus or extra-axial fluid collection. Ventricles and sulci are normal in size and configuration. Vascular: Major vessel flow voids at the skull base are preserved. Skull and upper cervical spine: Normal marrow signal is preserved. Sinuses/Orbits: Paranasal sinuses are aerated. Orbits are unremarkable. Other: Sella is unremarkable.  Mastoid air cells are clear. IMPRESSION: Motion degraded. Subcentimeter acute infarct involving the midline inferior vermis. Chronic left parietal infarct. Electronically Signed   By: Praneil  Patel M.D.   On: 09/17/2020 13:59   ECHOCARDIOGRAM COMPLETE  Result Date: 09/18/2020    ECHOCARDIOGRAM REPORT    Patient Name:   Rhona N Flahive Date of Exam: 09/18/2020 Medical Rec #:  [id number]          Height:       64.0 in Accession #:    [id number]         Weight:       135.6 lb Date of Birth:  06/03/1976           BSA:          1.658 m Patient Age:    44 years           BP:           134/97 mmHg Patient Gender: F                  HR:           87 bpm. Exam Location:  Inpatient Procedure: 2D Echo, Cardiac Doppler and Color Doppler Indications:    Stroke  History:        Patient has prior history of Echocardiogram examinations, most                 recent 12/18/2017. Mitral Valve Disease; Risk                 Factors:Hypertension. ESRD. Lupus.  Sonographer:    Arthur Guy RDCS (AE) Referring Phys: [id number] MARGARET E [name] IMPRESSIONS  1. Left ventricular ejection fraction, by estimation, is 55 to 60%. The left ventricle has normal function. The left ventricle has no regional wall motion abnormalities. There is severe concentric left ventricular hypertrophy. Diastolic function indeterminant due to moderate-to-severe MAC.  2. Right ventricular systolic function is mildly reduced. The right ventricular size is normal. There is moderately elevated pulmonary artery systolic pressure.  3. Left atrial size was severely dilated.  4. Right atrial size was mildly dilated.  5. The mitral valve is degenerative. Moderate mitral valve regurgitation. Severe mitral stenosis with mean gradient 16mmHg and MVA of 0.73cm2 by continuity equation at HR 88bpm. Moderate to severe mitral annular calcification.  6. Tricuspid valve regurgitation is severe.  7. The aortic valve is tricuspid. There is mild calcification of the aortic valve. There is mild thickening of the aortic valve. Aortic valve regurgitation is mild. Mild to moderate aortic valve sclerosis/calcification is present, without any evidence of aortic stenosis.  8. The inferior vena cava is normal in size with <50% respiratory variability, suggesting right atrial pressure of 8 mmHg.  Comparison(s): Compared to prior study on 11/2017, there is now severe calcific mitral stenosis and severe tricuspid regurgitation. FINDINGS  Left Ventricle: Left ventricular ejection fraction, by estimation, is 55 to 60%. The left ventricle has normal function. The left ventricle has no regional wall motion abnormalities. The left ventricular internal cavity size was normal in size. There is  severe concentric left ventricular hypertrophy. Indeterminant due to moderate-to-severe MAC. Right Ventricle: The right ventricular size is   normal. Right vetricular wall thickness was not well visualized. Right ventricular systolic function is mildly reduced. There is moderately elevated pulmonary artery systolic pressure. The tricuspid regurgitant velocity is 3.51 m/s, and with an assumed right atrial pressure of 8 mmHg, the estimated right ventricular systolic pressure is 09.6 mmHg. Left Atrium: Left atrial size was severely dilated. Right Atrium: Right atrial size was mildly dilated. Pericardium: There is no evidence of pericardial effusion. Mitral Valve: The mitral valve is degenerative in appearance. There is moderate thickening of the mitral valve leaflet(s). There is moderate calcification of the mitral valve leaflet(s). Moderate to severe mitral annular calcification. Moderate mitral valve regurgitation. Severe mitral stenosis with mean gradient 47mHg and MVA of 0.73cm2 by continuity equation. Tricuspid Valve: The tricuspid valve is normal in structure. Tricuspid valve regurgitation is severe. No evidence of tricuspid stenosis. Aortic Valve: The aortic valve is tricuspid. There is mild calcification of the aortic valve. There is mild thickening of the aortic valve. Aortic valve regurgitation is mild. Aortic regurgitation PHT measures 816 msec. Mild to moderate aortic valve sclerosis/calcification is present, without any evidence of aortic stenosis. Aortic valve mean gradient measures 4.0 mmHg. Aortic valve peak  gradient measures 8.1 mmHg. Aortic valve area, by VTI measures 2.21 cm. Pulmonic Valve: The pulmonic valve was normal in structure. Pulmonic valve regurgitation is not visualized. No evidence of pulmonic stenosis. Aorta: The aortic root and ascending aorta are structurally normal, with no evidence of dilitation. Venous: The inferior vena cava is normal in size with less than 50% respiratory variability, suggesting right atrial pressure of 8 mmHg. IAS/Shunts: No atrial level shunt detected by color flow Doppler.  LEFT VENTRICLE PLAX 2D LVIDd:         2.50 cm  Diastology LVIDs:         1.80 cm  LV e' medial:    5.25 cm/s LV PW:         1.80 cm  LV E/e' medial:  36.8 LV IVS:        2.00 cm  LV e' lateral:   3.98 cm/s LVOT diam:     1.90 cm  LV E/e' lateral: 48.5 LV SV:         54 LV SV Index:   32 LVOT Area:     2.84 cm  RIGHT VENTRICLE             IVC RV Basal diam:  3.50 cm     IVC diam: 1.70 cm RV S prime:     10.30 cm/s TAPSE (M-mode): 1.7 cm LEFT ATRIUM             Index       RIGHT ATRIUM           Index LA diam:        3.80 cm 2.29 cm/m  RA Area:     16.80 cm LA Vol (A2C):   77.5 ml 46.73 ml/m RA Volume:   46.20 ml  27.86 ml/m LA Vol (A4C):   71.0 ml 42.81 ml/m LA Biplane Vol: 74.8 ml 45.10 ml/m  AORTIC VALVE AV Area (Vmax):    2.40 cm AV Area (Vmean):   2.45 cm AV Area (VTI):     2.21 cm AV Vmax:           142.00 cm/s AV Vmean:          99.000 cm/s AV VTI:            0.244 m AV Peak Grad:  8.1 mmHg AV Mean Grad:      4.0 mmHg LVOT Vmax:         120.00 cm/s LVOT Vmean:        85.400 cm/s LVOT VTI:          0.190 m LVOT/AV VTI ratio: 0.78 AI PHT:            816 msec  AORTA Ao Root diam: 2.90 cm Ao Asc diam:  3.00 cm MITRAL VALVE                 TRICUSPID VALVE MV Area (PHT): 1.98 cm      TR Peak grad:   49.3 mmHg MV Peak grad:  26.8 mmHg     TR Vmax:        351.00 cm/s MV Mean grad:  16.0 mmHg MV Vmax:       2.59 m/s      SHUNTS MV Vmean:      188.0 cm/s    Systemic VTI:  0.19 m MV Decel Time: 383  msec      Systemic Diam: 1.90 cm MR Peak grad:    120.6 mmHg MR Mean grad:    84.0 mmHg MR Vmax:         549.00 cm/s MR Vmean:        437.0 cm/s MR PISA:         2.26 cm MR PISA Eff ROA: 16 mm MR PISA Radius:  0.60 cm MV E velocity: 193.00 cm/s MV A velocity: 221.00 cm/s MV E/A ratio:  0.87 Gwyndolyn Kaufman MD Electronically signed by Gwyndolyn Kaufman MD Signature Date/Time: 09/18/2020/1:21:39 PM    Final    VAS [place] LOWER EXTREMITY VENOUS (DVT)  Result Date: 09/18/2020  Lower Venous DVT Study Indications: Stroke, and Lupus.  Comparison Study: No prior study Performing Technologist: Sharion Dove RVS  Examination Guidelines: A complete evaluation includes B-mode imaging, spectral Doppler, color Doppler, and power Doppler as needed of all accessible portions of each vessel. Bilateral testing is considered an integral part of a complete examination. Limited examinations for reoccurring indications may be performed as noted. The reflux portion of the exam is performed with the patient in reverse Trendelenburg.  +---------+---------------+---------+-----------+----------+--------------+ RIGHT    CompressibilityPhasicitySpontaneityPropertiesThrombus Aging +---------+---------------+---------+-----------+----------+--------------+ CFV      Full           Yes      Yes                                 +---------+---------------+---------+-----------+----------+--------------+ SFJ      Full                                                        +---------+---------------+---------+-----------+----------+--------------+ FV Prox  Full                                                        +---------+---------------+---------+-----------+----------+--------------+ FV Mid   Full                                                        +---------+---------------+---------+-----------+----------+--------------+  FV DistalFull                                                         +---------+---------------+---------+-----------+----------+--------------+ PFV      Full                                                        +---------+---------------+---------+-----------+----------+--------------+ POP      Full           Yes      Yes                                 +---------+---------------+---------+-----------+----------+--------------+ PTV      Full                                                        +---------+---------------+---------+-----------+----------+--------------+ PERO     Full                                                        +---------+---------------+---------+-----------+----------+--------------+   +---------+---------------+---------+-----------+----------+--------------+ LEFT     CompressibilityPhasicitySpontaneityPropertiesThrombus Aging +---------+---------------+---------+-----------+----------+--------------+ CFV      Full           Yes      Yes                                 +---------+---------------+---------+-----------+----------+--------------+ SFJ      Full                                                        +---------+---------------+---------+-----------+----------+--------------+ FV Prox  Full                                                        +---------+---------------+---------+-----------+----------+--------------+ FV Mid   Full                                                        +---------+---------------+---------+-----------+----------+--------------+ FV DistalFull                                                        +---------+---------------+---------+-----------+----------+--------------+  PFV      Full                                                        +---------+---------------+---------+-----------+----------+--------------+ POP      Full           Yes      Yes                                  +---------+---------------+---------+-----------+----------+--------------+ PTV      Full                                                        +---------+---------------+---------+-----------+----------+--------------+ PERO     Full                                                        +---------+---------------+---------+-----------+----------+--------------+     Summary: BILATERAL: - No evidence of deep vein thrombosis seen in the lower extremities, bilaterally. -   *See table(s) above for measurements and observations. Electronically signed by [name] MD on 09/18/2020 at 2:08:18 PM.    Final     Assessment and Plan:   1. 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  There were no obvious vegetations on the mitral valve and the patient does not have any signs or symptoms consistent with bacterial endocarditis.  Patient will need a TEE to further visualize the mitral valve, particularly with her history of lupus to rule out infectious and noninfectious endocarditis.  Depending on the results of the study patient will need to be evaluated for need for anticoagulation versus antiplatelet therapy.  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  Patient is hypertensive and tachycardic today.  She does have a history of hypertension on hydralazine and diltiazem.  I think the patient would benefit from further additional negative chronotropic medications such as metoprolol as she has experienced worsening exercise tolerance likely secondary to her mitral stenosis. - Metoprolol 25 mg BID and discontinue hydralazine  - TEE scheduled for tomorrow. - Patient will need a cardiac monitor at discharge to rule out underlying arrhythmias.  2. HTN - Patient is hypertensive today at 150's/90's. She is currently not on her home medication of   diltiazem 240 mg and Hydralazine 25 mg BID.  - Discontinue hydralazine - Restart Diltiazem once out of permissive HTN - Start metoprolol 25 mg BID   3. Acute Cerebellar Vermis Infarct - Patient's symptoms have resolved today. She denies any further dizziness today.  Patient is found to have significant mitral stenosis with left atrial enlargement.  Started on aspirin 81 mg due to thrombocytopenia. Will continue atorvastatin 40 mg.  Plan for TEE tomorrow.  Defer further management to neurology and primary team.  4.  Lupus-patient has a history of lupus was diagnosed  in 2005.  She takes prednisone 10 mg daily.  Will defer management of lupus to primary team.  ESRD -patient has ESRD on hemodialysis (TTS).  ESRD secondary to lupus nephritis.  Defer further treatment to nephrology team.  For questions or updates, please contact [facility] Please consult [email or web address] for contact info under Cardiology/STEMI.   Signed, Marianna Payment, MD  09/19/2020 12:51 PM   Patient seen, examined. Available data reviewed. Agree with findings, assessment, and plan as outlined by Dr Marianna Payment.  The patient is independently interviewed and examined.  While she is currently hospitalized for an acute cerebellar stroke, she does admit to exercise intolerance and exertional shortness of breath that are fairly longstanding complaints.  The patient has been on hemodialysis now for 9 years.  She is alert, oriented, no distress.  JVP is normal, carotid upstrokes are normal without bruits, HEENT is normal, lungs are clear to auscultation bilaterally.  Heart is regular rate and rhythm with a 2/6 holosystolic murmur at the apex 2/6 low pitched diastolic murmur at the left lower sternal border.  Abdomen is soft and nontender with no masses, extremities trace pedal edema bilaterally.  EKG is reviewed and shows normal sinus rhythm 89 bpm with right bundle branch block and left axis deviation.  Personal review of her echocardiogram  demonstrates severe concentric left ventricular hypertrophy with a small LV chamber size, biatrial enlargement, and severe calcification of the mitral valve annulus with thickening of the mitral valve leaflets.  The patient's mean transvalvular gradient is 16 mmHg with a calculated mitral valve area less than 1 cm.  The patient presents with a cerebellar stroke now with good neurologic recovery and no significant residual deficit.  I agree that it is reasonable to evaluate her for cardiac source of embolus with a transesophageal echocardiogram.  I reviewed risks, indications, and alternatives of transesophageal echocardiography with the patient today.  She is willing to proceed and we will schedule this to be done tomorrow.  She will be n.p.o. after midnight.  I do not see any clear vegetation on her surface echo.  I think you should also have outpatient telemetry to evaluate for atrial fibrillation in the setting of left atrial enlargement and mitral stenosis.  She will have a 30-day event monitor for initial testing.  I agree with starting her on metoprolol to slow her heart rate which should allow for improved left ventricular filling.  Hydralazine will be stopped temporarily to evaluate her blood pressure response to the addition of metoprolol.  She is already treated with diltiazem.  Further recommendations pending her transesophageal echo results.  Sherren Mocha, M.D. 09/19/2020 4:47 PM

## 2020-09-19 NOTE — Progress Notes (Signed)
[facility] Progress Note   Subjective:  Patient feels well today.  Feels that her dizziness as well as her other symptoms are improving.  Objective Vitals:   09/18/20 1530 09/18/20 2034 09/19/20 0000 09/19/20 0350  BP: 136/90 (!) 144/96 (!) 147/98 (!) 149/92  Pulse: 98 100 99 100  Resp: 18 19    Temp: 98.8 F (37.1 C) 98.2 F (36.8 C) 98.2 F (36.8 C) 98 F (36.7 C)  TempSrc: Oral Oral Oral Oral  SpO2: 100% 100% 99% 100%  Weight:      Height:         Additional Objective Labs: Basic Metabolic Panel: Recent Labs  Lab 09/17/20 1303 09/17/20 1303 09/17/20 1311 09/18/20 0201 09/19/20 0242  NA 136   < > 134* 135 134*  K 5.3*   < > 4.9 3.2* 4.3  CL 98   < > 97* 96* 97*  CO2 24  --   --  27 25  GLUCOSE 170*   < > 166* 135* 174*  BUN 56*   < > 66* 18 46*  CREATININE 8.21*   < > 8.60* 4.24* 6.85*  CALCIUM 8.9  --   --  8.3* 8.1*  PHOS  --   --   --  3.5 6.3*   < > = values in this interval not displayed.   CBC: Recent Labs  Lab 09/17/20 1303 09/17/20 1303 09/17/20 1311 09/18/20 0201 09/19/20 0242  WBC 12.1*  --   --  9.4 10.9*  NEUTROABS 9.9*  --   --   --   --   HGB 10.9*   < > 12.6 11.3* 10.4*  HCT 35.1*   < > 37.0 35.7* 33.6*  MCV 91.9  --   --  90.4 91.6  PLT 78*  --   --  75* 74*   < > = values in this interval not displayed.   Blood Culture    Component Value Date/Time   SDES BLOOD RIGHT FOREARM UPPER 03/15/2020 0408   SPECREQUEST  03/15/2020 0408    BOTTLES DRAWN AEROBIC AND ANAEROBIC Blood Culture adequate volume   CULT  03/15/2020 0408    NO GROWTH 5 DAYS Performed at [facility], [place] [address] [place], [place] [address]    REPTSTATUS 03/20/2020 FINAL 03/15/2020 0408     Physical Exam General: lying in bed no distress Heart: normal rate Lungs: bilateral chest rise, no iwob Abdomen: Soft non-tender  Extremities: trace edema in ble Dialysis Access: R IJ TDC dsg c,d,i; R AVF +bruit   Medications: . sodium  chloride    . sodium chloride     . aspirin EC  81 mg Oral Daily  . atorvastatin  40 mg Oral Daily  . calcium acetate (Phos Binder)  1,334 mg Oral TID WC  . Chlorhexidine Gluconate Cloth  6 each Topical Daily  . pantoprazole  40 mg Oral Daily  . PARoxetine  40 mg Oral Daily  . predniSONE  10 mg Oral Q breakfast  . traZODone  100 mg Oral QHS    Dialysis Orders:  [facility] TTS 3h 400/600 2K/2.5Ca EDW 58kg TDC Hep bolus 1800 EPO 6600 q HD Venofer 50 q Mon  Assessment/Plan: 1. Dizziness/Ataxia. Stroke w/u in progress. MRI showing subcentimeter acute infarct involving the midline inferior vermis. Chronic left parietal infarct -per neuro/primary team. On antiplatelet therapy. Undergoing TEE to rule out embolic source. 2. ESRD -  HD TTS via TDC. (Had plans for OP  fistulogram this week)  Continue on schedule. Next HD Tues 11/2.  3. Hypertension/volume  - BP borderline on admission. No meds here. Probably do not want to drop BP too acutely given clinical situation 4. Anemia  - Hgb 10.4. No ESA needs currently  5. Metabolic bone disease -  Ca ok. Continue home binders (phoslyra). No VDRA.  6.  SLE

## 2020-09-19 NOTE — Consult Note (Addendum)
Cardiology Consultation:   Patient ID: [name]; [id number]; [birth year]   Admit date: 09/17/2020 Date of Consult: 09/19/2020  Primary Care Provider: Leeanne Rio, MD Primary Cardiologist: Kate Sable, MD Primary Electrophysiologist: none   Patient Profile:   [name] is a 44 y.o. female with a hx of ESRD (TTS), HTN, HLD, SLE who is being seen today for the evaluation of severe mitral stenosis at the request of Dr. Thompson Grayer.  History of Present Illness:   [name] presented to [facility] on 10/30 with acute onset dizziness while laying in bed with associated vomiting and diarrhea incontinence. She initially presented to an outside hospital where she was diagnosed with "vertigo". She was prescribed meclizine and set up with an outpatient MRI before being discharged. Patient's dizziness progressed, which prompted her to got to [facility] for further evaluation. Neurology was consulted and MRI/CTA of the head showed small acute midline cerebellar vermis infarct and chronic left parietal infarct. Echo was performed due to underlying concern for embolic etiology, which showed severe left atrial dilation and severe mitral stenosis in the setting of significant annular calcifications.    Past Medical History:  Diagnosis Date  . Allergy   . Anemia   . Anxiety   . Arthritis   . Avascular necrosis of bone (HCC)    left tibial talus due to chronic prednisone use  . Cerebellar stroke ([place])   . Depression   . Dyspnea    too much  fluid  . ESRD (end stage renal disease) on dialysis ([place])    tthsat [place]  . GERD (gastroesophageal reflux disease)   . Headache(784.0)   . History of high blood pressure   . Hyperlipemia   . Hypertension   . Lupus ([place])   . Pneumonia   . Secondary hyperparathyroidism (of renal origin)     Past Surgical History:  Procedure Laterality Date  . A/V FISTULAGRAM Left 12/02/2017   Procedure: A/V FISTULAGRAM - Left upper;  Surgeon: Angelia Mould, MD;  Location: [place] [facility];  Service: Cardiovascular;  Laterality: Left;  . AV FISTULA PLACEMENT Left   . AV FISTULA PLACEMENT Right 01/26/2014   Procedure: ARTERIOVENOUS (AV) FISTULA CREATION; ultrasound guided;  Surgeon: Conrad Largo, MD;  Location: [place];  Service: Vascular;  Laterality: Right;  . AV FISTULA PLACEMENT Right 04/25/2020   Procedure: RIGHT UPPER ARM  BASILIC VEIN ARTERIOVENOUS (AV) FISTULA;  Surgeon: [name], MD;  Location: [place];  Service: Vascular;  Laterality: Right;  . BASCILIC VEIN TRANSPOSITION Right 07/01/2020   Procedure: SECOND STAGE BASCILIC VEIN TRANSPOSITION RIGHT UPPER EXTREMITY;  Surgeon: [name], MD;  Location: [facility] OR;  Service: Vascular;  Laterality: Right;  . ESOPHAGOGASTRODUODENOSCOPY N/A 03/17/2016   Procedure: ESOPHAGOGASTRODUODENOSCOPY (EGD);  Surgeon: Irene Shipper, MD;  Location[facility] ENDOSCOPY;  Service: Endoscopy;  Laterality: N/A;  . FISTULOGRAM Left 09/24/2019   Procedure: FISTULOGRAM LEFT ARM;  Surgeon: [name], MD;  Location: [place];  Service: Vascular;  Laterality: Left;  . HEMATOMA EVACUATION Left 10/09/2013   Procedure: EVACUATION HEMATOMA;  Surgeon: Angelia Mould, MD;  Location: [place];  Service: Vascular;  Laterality: Left;  . HEMATOMA EVACUATION Left 09/24/2019   Procedure: EVACUATION HEMATOMA  LEFT ARM;  Surgeon: [name], MD;  Location: [place];  Service: Vascular;  Laterality: Left;  . INCISION AND DRAINAGE ABSCESS     gluteal  . INSERTION OF DIALYSIS CATHETER N/A 10/09/2013   Procedure: INSERTION  OF DIALYSIS CATHETER; ULTRASOUND GUIDED;  Surgeon: Angelia Mould, MD;  Location: [place];  Service: Vascular;  Laterality: N/A;  . INSERTION OF DIALYSIS CATHETER Left 09/24/2019   Procedure: INSERTION OF TUNNELLED DIALYSIS CATHETER - PALINDROME 15FR X 28CM;  Surgeon: [name], MD;  Location: [place];  Service: Vascular;  Laterality: Left;  . INSERTION OF DIALYSIS  CATHETER Right 03/15/2020   Procedure: Insertion Of Dialysis Catheter;  Surgeon: Angelia Mould, MD;  Location: [place];  Service: Cardiovascular;  Laterality: Right;  . PERIPHERAL VASCULAR BALLOON ANGIOPLASTY Left 12/02/2017   Procedure: PERIPHERAL VASCULAR BALLOON ANGIOPLASTY;  Surgeon: Angelia Mould, MD;  Location: [place] [facility];  Service: Cardiovascular;  Laterality: Left;  . REVISION OF ARTERIOVENOUS GORETEX GRAFT Left 04/03/2019   Procedure: REVISION OF ARTERIOVENOUS GORETEX GRAFT PSEUDOANEURYSM;  Surgeon: Angelia Mould, MD;  Location: [place];  Service: Vascular;  Laterality: Left;  . SHUNTOGRAM N/A 01/07/2014   Procedure: fistulogram with possibe venoplasty left upper arm avg;  Surgeon: Angelia Mould, MD;  Location: [facility] CATH LAB;  Service: Cardiovascular;  Laterality: N/A;  . THROMBECTOMY AND REVISION OF ARTERIOVENTOUS (AV) GORETEX  GRAFT Left 03/15/2020   Procedure: ATTEMPTED THROMBECTOMY OF LEFT ARM ARTERIOVENTOUS (AV) GORETEX  GRAFT;  Surgeon: Angelia Mould, MD;  Location: [place];  Service: Cardiovascular;  Laterality: Left;  . tibiocalcaneal fusion  left Left   . ULTRASOUND GUIDANCE FOR VASCULAR ACCESS  09/24/2019   Procedure: Ultrasound Guidance For Vascular Access;  Surgeon: [name], MD;  Location: [place];  Service: Vascular;;     Home Medications:  Prior to Admission medications   Medication Sig Start Date End Date Taking? Authorizing Provider  acetaminophen (TYLENOL) 500 MG tablet Take 1,000 mg by mouth every 6 (six) hours as needed for moderate pain or headache.    Yes [provider]  b complex-vitamin c-folic acid (NEPHRO-VITE) 0.8 MG TABS tablet Take 1 tablet by mouth daily at 12 noon.   Yes [provider]  calcium acetate, Phos Binder, (PHOSLYRA) 667 MG/5ML SOLN Take 2,001 mg by mouth See admin instructions. Take 15 mls (2001 mg) by mouth with each meal and snack - up to 9 times daily   Yes [provider]  Carboxymethylcellul-Glycerin (LUBRICATING EYE DROPS OP) Place 1 drop into both eyes daily as needed (dry eyes).   Yes [provider]  diltiazem (TIAZAC) 240 MG 24 hr capsule Take 240 mg by mouth at bedtime.    Yes [provider]  hydrALAZINE (APRESOLINE) 25 MG tablet Take 25 mg by mouth in the morning and at bedtime.   Yes [provider]  pantoprazole (PROTONIX) 40 MG tablet Take 1 tablet (40 mg total) by mouth daily. 12/04/17  Yes Hoyt Koch, MD  PARoxetine (PAXIL) 40 MG tablet Take 40 mg by mouth daily.   Yes [provider]  predniSONE (DELTASONE) 10 MG tablet Take 1 tablet (10 mg total) by mouth daily with breakfast. 08/29/17  Yes Hoyt Koch, MD  traZODone (DESYREL) 100 MG tablet Take 100 mg by mouth at bedtime.   Yes [provider]  clopidogrel (PLAVIX) 75 MG tablet Take 75 mg by mouth daily. 09/17/20   [provider]  HYDROcodone-acetaminophen (NORCO/VICODIN) 5-325 MG tablet Take 1 tablet by mouth every 6 (six) hours as needed for moderate pain. Patient not taking: Reported on 09/14/2020 07/01/20 07/01/21  Ulyses Amor, PA-C  meclizine (ANTIVERT) 25 MG tablet Take 25 mg by  mouth 3 (three) times daily as needed. 09/17/20   [provider]    Inpatient Medications: Scheduled Meds: . aspirin EC  81 mg Oral Daily  . atorvastatin  40 mg Oral Daily  . calcium acetate (Phos Binder)  1,334 mg Oral TID WC  . Chlorhexidine Gluconate Cloth  6 each Topical Daily  . pantoprazole  40 mg Oral Daily  . PARoxetine  40 mg Oral Daily  . predniSONE  10 mg Oral Q breakfast  . traZODone  100 mg Oral QHS   Continuous Infusions: . sodium chloride    . sodium chloride     PRN Meds: sodium chloride, sodium chloride, acetaminophen **OR** acetaminophen, alteplase, heparin, heparin, lidocaine (PF), lidocaine-prilocaine, pentafluoroprop-tetrafluoroeth, polyvinyl alcohol  Allergies:    Allergies    Allergen Reactions  . Amlodipine Hives and Swelling  . Sulfa Antibiotics Hives and Itching  . Vancomycin Hives and Itching  . Venlafaxine Other (See Comments)    Emotional    Social History:   Social History   Socioeconomic History  . Marital status: Single    Spouse name: none  . Number of children: 0  . Years of education: 13  . Highest education level: Some college, no degree  Occupational History  . Occupation: unemployed    Comment: disability  Tobacco Use  . Smoking status: Never Smoker  . Smokeless tobacco: Never Used  Vaping Use  . Vaping Use: Never used  Substance and Sexual Activity  . Alcohol use: No  . Drug use: No  . Sexual activity: Not Currently    Birth control/protection: None  Other Topics Concern  . Not on file  Social History Narrative   Lives alone   [place] to [place] 4 months ago to be closer to family   Previously lived in [place] , more active   Very close to sister [place]   Social Determinants of Health   Financial Resource Strain:   . Difficulty of Paying Living Expenses: Not on file  Food Insecurity:   . Worried About Charity fundraiser in the Last Year: Not on file  . Ran Out of Food in the Last Year: Not on file  Transportation Needs:   . Lack of Transportation (Medical): Not on file  . Lack of Transportation (Non-Medical): Not on file  Physical Activity:   . Days of Exercise per Week: Not on file  . Minutes of Exercise per Session: Not on file  Stress:   . Feeling of Stress : Not on file  Social Connections:   . Frequency of Communication with Friends and Family: Not on file  . Frequency of Social Gatherings with Friends and Family: Not on file  . Attends Religious Services: Not on file  . Active Member of Clubs or Organizations: Not on file  . Attends Archivist Meetings: Not on file  . Marital Status: Not on file  Intimate Partner Violence:   . Fear of Current or Ex-Partner: Not on file  . Emotionally Abused: Not  on file  . Physically Abused: Not on file  . Sexually Abused: Not on file    Family History:   Family History  Problem Relation Age of Onset  . Asthma Sister   . Hypertension Mother   . Heart attack Mother 94       died at 95  . COPD Maternal Uncle        prostate  . Mental illness Neg Hx      ROS:  Please see the history of present illness.  Review of Systems  All other systems reviewed and are negative.  All other ROS reviewed and negative.     Physical Exam/Data:   Vitals:   09/18/20 2034 09/19/20 0000 09/19/20 0350 09/19/20 1142  BP: (!) 144/96 (!) 147/98 (!) 149/92 (!) 152/107  Pulse: 100 99 100 95  Resp: 19   10  Temp: 98.2 F (36.8 C) 98.2 F (36.8 C) 98 F (36.7 C) 98.1 F (36.7 C)  TempSrc: Oral Oral Oral Oral  SpO2: 100% 99% 100% 100%  Weight:      Height:        Intake/Output Summary (Last 24 hours) at 09/19/2020 1251 Last data filed at 09/18/2020 2100 Gross per 24 hour  Intake 100 ml  Output --  Net 100 ml   Filed Weights   09/17/20 0813 09/17/20 1940  Weight: 60.3 kg 61.5 kg   Body mass index is 23.27 kg/m.  General:  Well nourished, well developed, in no acute distress HEENT: normal Lymph: no adenopathy Neck: no JVD Endocrine:  No thryomegaly Vascular: No carotid bruits; FA pulses 2+ bilaterally without bruits  Cardiac:  Normal  S1,  Prominent S2 at left sternal boarder; RRR; no murmur  Lungs:  clear to auscultation bilaterally, no wheezing, rhonchi or rales  Abd: soft, nontender, no hepatomegaly  Ext: tace edema with stasis dermatitis present on bilateral LE Musculoskeletal:  No deformities, BUE and BLE strength normal and equal Skin: warm and dry  Neuro:  CNs 2-12 intact, no focal abnormalities noted Psych:  Normal affect   EKG:  The EKG was personally reviewed and demonstrates:  Sinus rhythm with right bundle branch block Telemetry:  Telemetry was personally reviewed and demonstrates:  Sinus tachycardia  Relevant CV  Studies:  Echocardiogram: (09/19/2020) 1. Left ventricular ejection fraction, by estimation, is 55 to 60%. The  left ventricle has normal function. The left ventricle has no regional  wall motion abnormalities. There is severe concentric left ventricular  hypertrophy. Diastolic function  indeterminant due to moderate-to-severe MAC.  2. Right ventricular systolic function is mildly reduced. The right  ventricular size is normal. There is moderately elevated pulmonary artery  systolic pressure.  3. Left atrial size was severely dilated.  4. Right atrial size was mildly dilated.  5. The mitral valve is degenerative. Moderate mitral valve regurgitation.  Severe mitral stenosis with mean gradient 52mHg and MVA of 0.73cm2 by  continuity equation at HR 88bpm. Moderate to severe mitral annular  calcification.  6. Tricuspid valve regurgitation is severe.  7. The aortic valve is tricuspid. There is mild calcification of the  aortic valve. There is mild thickening of the aortic valve. Aortic valve  regurgitation is mild. Mild to moderate aortic valve  sclerosis/calcification is present, without any evidence  of aortic stenosis.  8. The inferior vena cava is normal in size with <50% respiratory  variability, suggesting right atrial pressure of 8 mmHg.   Comparison(s): Compared to prior study on 11/2017, there is now severe  calcific mitral stenosis and severe tricuspid regurgitation.   Laboratory Data:  Chemistry Recent Labs  Lab 09/17/20 1303 09/17/20 1303 09/17/20 1311 09/18/20 0201 09/19/20 0242  NA 136   < > 134* 135 134*  K 5.3*   < > 4.9 3.2* 4.3  CL 98   < > 97* 96* 97*  CO2 24  --   --  27 25  GLUCOSE 170*   < > 166* 135* 174*  BUN 56*   < > 66* 18 46*  CREATININE 8.21*   < > 8.60* 4.24* 6.85*  CALCIUM 8.9  --   --  8.3* 8.1*  GFRNONAA 6*  --   --  13* 7*  ANIONGAP 14  --   --  12 12   < > = values in this interval not displayed.    Recent Labs  Lab  09/17/20 1303 09/18/20 0201 09/19/20 0242  PROT 5.7*  --   --   ALBUMIN 3.1* 3.0* 2.6*  AST 78*  --   --   ALT 105* 84*  --   ALKPHOS 123  --   --   BILITOT 0.9  --   --    Hematology Recent Labs  Lab 09/17/20 1303 09/17/20 1303 09/17/20 1311 09/18/20 0201 09/19/20 0242  WBC 12.1*  --   --  9.4 10.9*  RBC 3.82*  --   --  3.95 3.67*  HGB 10.9*   < > 12.6 11.3* 10.4*  HCT 35.1*   < > 37.0 35.7* 33.6*  MCV 91.9  --   --  90.4 91.6  MCH 28.5  --   --  28.6 28.3  MCHC 31.1  --   --  31.7 31.0  RDW 19.3*  --   --  19.3* 19.3*  PLT 78*  --   --  75* 74*   < > = values in this interval not displayed.   Cardiac EnzymesNo results for input(s): TROPONINI in the last 168 hours. No results for input(s): TROPIPOC in the last 168 hours.  BNPNo results for input(s): BNP, PROBNP in the last 168 hours.  DDimer No results for input(s): DDIMER in the last 168 hours.  Radiology/Studies:  CT ANGIO HEAD W OR WO CONTRAST  Result Date: 09/18/2020 CLINICAL DATA:  Small cerebellar stroke on MRI EXAM: CT ANGIOGRAPHY HEAD AND NECK TECHNIQUE: Multidetector CT imaging of the head and neck was performed using the standard protocol during bolus administration of intravenous contrast. Multiplanar CT image reconstructions and MIPs were obtained to evaluate the vascular anatomy. Carotid stenosis measurements (when applicable) are obtained utilizing NASCET criteria, using the distal internal carotid diameter as the denominator. CONTRAST:  86m OMNIPAQUE IOHEXOL 350 MG/ML SOLN COMPARISON:  None. FINDINGS: CT HEAD Brain: There is a small area hypoattenuation corresponding to area of midline inferior cerebellar infarction on prior MRI. There is no acute intracranial hemorrhage or mass effect. No acute appearing loss of gray-white differentiation. Chronic left parietal infarct. There is no extra-axial fluid collection. Ventricles and sulci are within normal limits in size and configuration. Vascular: Better evaluated  on CTA portion. Skull: Calvarium is unremarkable. Sinuses/Orbits: Bilateral proptosis.  No acute finding. Other: None. Review of the MIP images confirms the above findings CTA NECK Aortic arch: Great vessel origins are patent. There is direct origin of the left vertebral artery from the arch. Right carotid system: Patent. Minimal calcified plaque at the ICA origin without measurable stenosis. Left carotid system: Patent. Mild calcified plaque at the ICA origin without measurable stenosis. Vertebral arteries: Patent.  Left vertebral artery is dominant. Skeleton: No significant abnormality. Other neck: No mass or adenopathy. Upper chest: No apical lung mass. Right IJ approach catheter enters the SVC. Review of the MIP images confirms the above findings CTA HEAD Anterior circulation: Intracranial internal carotid arteries are patent with calcified plaque. Moderate stenosis of the supraclinoid portions bilaterally. Anterior and middle cerebral arteries are patent. Posterior circulation: Intracranial vertebral arteries are patent.  Right vertebral artery becomes diminutive after PICA origin. Both PICA origins are patent. Basilar artery is patent. Superior cerebellar artery origins are patent. Posterior cerebral arteries are patent. Bilateral posterior communicating arteries are present with fetal origin of the left posterior cerebral artery. Venous sinuses: Patent as allowed by contrast bolus timing. Review of the MIP images confirms the above findings IMPRESSION: Small acute midline cerebellar infarct as seen on MRI. No new intracranial abnormality. No hemodynamically significant stenosis in the neck. No proximal intracranial vessel occlusion. 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 Electronically Signed   By: Macy Mis M.D.   On: 09/18/2020 08:01   CT ANGIO NECK W OR WO CONTRAST  Result Date: 09/18/2020 CLINICAL DATA:  Small cerebellar stroke on MRI EXAM: CT ANGIOGRAPHY  HEAD AND NECK TECHNIQUE: Multidetector CT imaging of the head and neck was performed using the standard protocol during bolus administration of intravenous contrast. Multiplanar CT image reconstructions and MIPs were obtained to evaluate the vascular anatomy. Carotid stenosis measurements (when applicable) are obtained utilizing NASCET criteria, using the distal internal carotid diameter as the denominator. CONTRAST:  37m OMNIPAQUE IOHEXOL 350 MG/ML SOLN COMPARISON:  None. FINDINGS: CT HEAD Brain: There is a small area hypoattenuation corresponding to area of midline inferior cerebellar infarction on prior MRI. There is no acute intracranial hemorrhage or mass effect. No acute appearing loss of gray-white differentiation. Chronic left parietal infarct. There is no extra-axial fluid collection. Ventricles and sulci are within normal limits in size and configuration. Vascular: Better evaluated on CTA portion. Skull: Calvarium is unremarkable. Sinuses/Orbits: Bilateral proptosis.  No acute finding. Other: None. Review of the MIP images confirms the above findings CTA NECK Aortic arch: Great vessel origins are patent. There is direct origin of the left vertebral artery from the arch. Right carotid system: Patent. Minimal calcified plaque at the ICA origin without measurable stenosis. Left carotid system: Patent. Mild calcified plaque at the ICA origin without measurable stenosis. Vertebral arteries: Patent.  Left vertebral artery is dominant. Skeleton: No significant abnormality. Other neck: No mass or adenopathy. Upper chest: No apical lung mass. Right IJ approach catheter enters the SVC. Review of the MIP images confirms the above findings CTA HEAD Anterior circulation: Intracranial internal carotid arteries are patent with calcified plaque. Moderate stenosis of the supraclinoid portions bilaterally. Anterior and middle cerebral arteries are patent. Posterior circulation: Intracranial vertebral arteries are patent.  Right vertebral artery becomes diminutive after PICA origin. Both PICA origins are patent. Basilar artery is patent. Superior cerebellar artery origins are patent. Posterior cerebral arteries are patent. Bilateral posterior communicating arteries are present with fetal origin of the left posterior cerebral artery. Venous sinuses: Patent as allowed by contrast bolus timing. Review of the MIP images confirms the above findings IMPRESSION: Small acute midline cerebellar infarct as seen on MRI. No new intracranial abnormality. No hemodynamically significant stenosis in the neck. No proximal intracranial vessel occlusion. 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 Electronically Signed   By: PMacy MisM.D.   On: 09/18/2020 08:01   MR BRAIN WO CONTRAST  Result Date: 09/17/2020 CLINICAL DATA:  Dizziness EXAM: MRI HEAD WITHOUT CONTRAST TECHNIQUE: Multiplanar, multiecho pulse sequences of the brain and surrounding structures were obtained without intravenous contrast. COMPARISON:  None. FINDINGS: Motion artifact is present. Brain: There is an 8 mm focus of mildly reduced diffusion involving the midline inferior vermis. Chronic left parietal infarct with chronic blood products. There is no intracranial mass, mass effect, or edema. There  is no hydrocephalus or extra-axial fluid collection. Ventricles and sulci are normal in size and configuration. Vascular: Major vessel flow voids at the skull base are preserved. Skull and upper cervical spine: Normal marrow signal is preserved. Sinuses/Orbits: Paranasal sinuses are aerated. Orbits are unremarkable. Other: Sella is unremarkable.  Mastoid air cells are clear. IMPRESSION: Motion degraded. Subcentimeter acute infarct involving the midline inferior vermis. Chronic left parietal infarct. Electronically Signed   By: Macy Mis M.D.   On: 09/17/2020 13:59   ECHOCARDIOGRAM COMPLETE  Result Date: 09/18/2020    ECHOCARDIOGRAM REPORT    Patient Name:   [name] Date of Exam: 09/18/2020 Medical Rec #:  [id number]          Height:       64.0 in Accession #:    [id number]         Weight:       135.6 lb Date of Birth:  1976/09/11           BSA:          1.658 m Patient Age:    35 years           BP:           134/97 mmHg Patient Gender: F                  HR:           87 bpm. Exam Location:  Inpatient Procedure: 2D Echo, Cardiac Doppler and Color Doppler Indications:    Stroke  History:        Patient has prior history of Echocardiogram examinations, most                 recent 12/18/2017. Mitral Valve Disease; Risk                 Factors:Hypertension. ESRD. Lupus.  Sonographer:    Clayton Lefort RDCS (AE) Referring Phys: [id number] [place]  1. Left ventricular ejection fraction, by estimation, is 55 to 60%. The left ventricle has normal function. The left ventricle has no regional wall motion abnormalities. There is severe concentric left ventricular hypertrophy. Diastolic function indeterminant due to moderate-to-severe MAC.  2. Right ventricular systolic function is mildly reduced. The right ventricular size is normal. There is moderately elevated pulmonary artery systolic pressure.  3. Left atrial size was severely dilated.  4. Right atrial size was mildly dilated.  5. The mitral valve is degenerative. Moderate mitral valve regurgitation. Severe mitral stenosis with mean gradient 35mHg and MVA of 0.73cm2 by continuity equation at HR 88bpm. Moderate to severe mitral annular calcification.  6. Tricuspid valve regurgitation is severe.  7. The aortic valve is tricuspid. There is mild calcification of the aortic valve. There is mild thickening of the aortic valve. Aortic valve regurgitation is mild. Mild to moderate aortic valve sclerosis/calcification is present, without any evidence of aortic stenosis.  8. The inferior vena cava is normal in size with <50% respiratory variability, suggesting right atrial pressure of 8 mmHg.  Comparison(s): Compared to prior study on 11/2017, there is now severe calcific mitral stenosis and severe tricuspid regurgitation. FINDINGS  Left Ventricle: Left ventricular ejection fraction, by estimation, is 55 to 60%. The left ventricle has normal function. The left ventricle has no regional wall motion abnormalities. The left ventricular internal cavity size was normal in size. There is  severe concentric left ventricular hypertrophy. Indeterminant due to moderate-to-severe MAC. Right Ventricle: The right ventricular size is  normal. Right vetricular wall thickness was not well visualized. Right ventricular systolic function is mildly reduced. There is moderately elevated pulmonary artery systolic pressure. The tricuspid regurgitant velocity is 3.51 m/s, and with an assumed right atrial pressure of 8 mmHg, the estimated right ventricular systolic pressure is 16.6 mmHg. Left Atrium: Left atrial size was severely dilated. Right Atrium: Right atrial size was mildly dilated. Pericardium: There is no evidence of pericardial effusion. Mitral Valve: The mitral valve is degenerative in appearance. There is moderate thickening of the mitral valve leaflet(s). There is moderate calcification of the mitral valve leaflet(s). Moderate to severe mitral annular calcification. Moderate mitral valve regurgitation. Severe mitral stenosis with mean gradient 31mHg and MVA of 0.73cm2 by continuity equation. Tricuspid Valve: The tricuspid valve is normal in structure. Tricuspid valve regurgitation is severe. No evidence of tricuspid stenosis. Aortic Valve: The aortic valve is tricuspid. There is mild calcification of the aortic valve. There is mild thickening of the aortic valve. Aortic valve regurgitation is mild. Aortic regurgitation PHT measures 816 msec. Mild to moderate aortic valve sclerosis/calcification is present, without any evidence of aortic stenosis. Aortic valve mean gradient measures 4.0 mmHg. Aortic valve peak  gradient measures 8.1 mmHg. Aortic valve area, by VTI measures 2.21 cm. Pulmonic Valve: The pulmonic valve was normal in structure. Pulmonic valve regurgitation is not visualized. No evidence of pulmonic stenosis. Aorta: The aortic root and ascending aorta are structurally normal, with no evidence of dilitation. Venous: The inferior vena cava is normal in size with less than 50% respiratory variability, suggesting right atrial pressure of 8 mmHg. IAS/Shunts: No atrial level shunt detected by color flow Doppler.  LEFT VENTRICLE PLAX 2D LVIDd:         2.50 cm  Diastology LVIDs:         1.80 cm  LV e' medial:    5.25 cm/s LV PW:         1.80 cm  LV E/e' medial:  36.8 LV IVS:        2.00 cm  LV e' lateral:   3.98 cm/s LVOT diam:     1.90 cm  LV E/e' lateral: 48.5 LV SV:         54 LV SV Index:   32 LVOT Area:     2.84 cm  RIGHT VENTRICLE             IVC RV Basal diam:  3.50 cm     IVC diam: 1.70 cm RV S prime:     10.30 cm/s TAPSE (M-mode): 1.7 cm LEFT ATRIUM             Index       RIGHT ATRIUM           Index LA diam:        3.80 cm 2.29 cm/m  RA Area:     16.80 cm LA Vol (A2C):   77.5 ml 46.73 ml/m RA Volume:   46.20 ml  27.86 ml/m LA Vol (A4C):   71.0 ml 42.81 ml/m LA Biplane Vol: 74.8 ml 45.10 ml/m  AORTIC VALVE AV Area (Vmax):    2.40 cm AV Area (Vmean):   2.45 cm AV Area (VTI):     2.21 cm AV Vmax:           142.00 cm/s AV Vmean:          99.000 cm/s AV VTI:            0.244 m AV Peak Grad:  8.1 mmHg AV Mean Grad:      4.0 mmHg LVOT Vmax:         120.00 cm/s LVOT Vmean:        85.400 cm/s LVOT VTI:          0.190 m LVOT/AV VTI ratio: 0.78 AI PHT:            816 msec  AORTA Ao Root diam: 2.90 cm Ao Asc diam:  3.00 cm MITRAL VALVE                 TRICUSPID VALVE MV Area (PHT): 1.98 cm      TR Peak grad:   49.3 mmHg MV Peak grad:  26.8 mmHg     TR Vmax:        351.00 cm/s MV Mean grad:  16.0 mmHg MV Vmax:       2.59 m/s      SHUNTS MV Vmean:      188.0 cm/s    Systemic VTI:  0.19 m MV Decel Time: 383  msec      Systemic Diam: 1.90 cm MR Peak grad:    120.6 mmHg MR Mean grad:    84.0 mmHg MR Vmax:         549.00 cm/s MR Vmean:        437.0 cm/s MR PISA:         2.26 cm MR PISA Eff ROA: 16 mm MR PISA Radius:  0.60 cm MV E velocity: 193.00 cm/s MV A velocity: 221.00 cm/s MV E/A ratio:  0.87 Gwyndolyn Kaufman MD Electronically signed by Gwyndolyn Kaufman MD Signature Date/Time: 09/18/2020/1:21:39 PM    Final    VAS [place] LOWER EXTREMITY VENOUS (DVT)  Result Date: 09/18/2020  Lower Venous DVT Study Indications: Stroke, and Lupus.  Comparison Study: No prior study Performing Technologist: Sharion Dove RVS  Examination Guidelines: A complete evaluation includes B-mode imaging, spectral Doppler, color Doppler, and power Doppler as needed of all accessible portions of each vessel. Bilateral testing is considered an integral part of a complete examination. Limited examinations for reoccurring indications may be performed as noted. The reflux portion of the exam is performed with the patient in reverse Trendelenburg.  +---------+---------------+---------+-----------+----------+--------------+ RIGHT    CompressibilityPhasicitySpontaneityPropertiesThrombus Aging +---------+---------------+---------+-----------+----------+--------------+ CFV      Full           Yes      Yes                                 +---------+---------------+---------+-----------+----------+--------------+ SFJ      Full                                                        +---------+---------------+---------+-----------+----------+--------------+ FV Prox  Full                                                        +---------+---------------+---------+-----------+----------+--------------+ FV Mid   Full                                                        +---------+---------------+---------+-----------+----------+--------------+  FV DistalFull                                                         +---------+---------------+---------+-----------+----------+--------------+ PFV      Full                                                        +---------+---------------+---------+-----------+----------+--------------+ POP      Full           Yes      Yes                                 +---------+---------------+---------+-----------+----------+--------------+ PTV      Full                                                        +---------+---------------+---------+-----------+----------+--------------+ PERO     Full                                                        +---------+---------------+---------+-----------+----------+--------------+   +---------+---------------+---------+-----------+----------+--------------+ LEFT     CompressibilityPhasicitySpontaneityPropertiesThrombus Aging +---------+---------------+---------+-----------+----------+--------------+ CFV      Full           Yes      Yes                                 +---------+---------------+---------+-----------+----------+--------------+ SFJ      Full                                                        +---------+---------------+---------+-----------+----------+--------------+ FV Prox  Full                                                        +---------+---------------+---------+-----------+----------+--------------+ FV Mid   Full                                                        +---------+---------------+---------+-----------+----------+--------------+ FV DistalFull                                                        +---------+---------------+---------+-----------+----------+--------------+  PFV      Full                                                        +---------+---------------+---------+-----------+----------+--------------+ POP      Full           Yes      Yes                                  +---------+---------------+---------+-----------+----------+--------------+ PTV      Full                                                        +---------+---------------+---------+-----------+----------+--------------+ PERO     Full                                                        +---------+---------------+---------+-----------+----------+--------------+     Summary: BILATERAL: - No evidence of deep vein thrombosis seen in the lower extremities, bilaterally. -   *See table(s) above for measurements and observations. Electronically signed by [name] MD on 09/18/2020 at 2:08:18 PM.    Final     Assessment and Plan:   1. 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  There were no obvious vegetations on the mitral valve and the patient does not have any signs or symptoms consistent with bacterial endocarditis.  Patient will need a TEE to further visualize the mitral valve, particularly with her history of lupus to rule out infectious and noninfectious endocarditis.  Depending on the results of the study patient will need to be evaluated for need for anticoagulation versus antiplatelet therapy.  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  Patient is hypertensive and tachycardic today.  She does have a history of hypertension on hydralazine and diltiazem.  I think the patient would benefit from further additional negative chronotropic medications such as metoprolol as she has experienced worsening exercise tolerance likely secondary to her mitral stenosis. - Metoprolol 25 mg BID and discontinue hydralazine  - TEE scheduled for tomorrow. - Patient will need a cardiac monitor at discharge to rule out underlying arrhythmias.  2. HTN - Patient is hypertensive today at 150's/90's. She is currently not on her home medication of   diltiazem 240 mg and Hydralazine 25 mg BID.  - Discontinue hydralazine - Restart Diltiazem once out of permissive HTN - Start metoprolol 25 mg BID   3. Acute Cerebellar Vermis Infarct - Patient's symptoms have resolved today. She denies any further dizziness today.  Patient is found to have significant mitral stenosis with left atrial enlargement.  Started on aspirin 81 mg due to thrombocytopenia. Will continue atorvastatin 40 mg.  Plan for TEE tomorrow.  Defer further management to neurology and primary team.  4.  Lupus-patient has a history of lupus was diagnosed  in 2005.  She takes prednisone 10 mg daily.  Will defer management of lupus to primary team.  ESRD -patient has ESRD on hemodialysis (TTS).  ESRD secondary to lupus nephritis.  Defer further treatment to nephrology team.  For questions or updates, please contact [place] Please consult [email or web address] for contact info under Cardiology/STEMI.   Signed, Marianna Payment, MD  09/19/2020 12:51 PM   Patient seen, examined. Available data reviewed. Agree with findings, assessment, and plan as outlined by Dr Marianna Payment.  The patient is independently interviewed and examined.  While she is currently hospitalized for an acute cerebellar stroke, she does admit to exercise intolerance and exertional shortness of breath that are fairly longstanding complaints.  The patient has been on hemodialysis now for 9 years.  She is alert, oriented, no distress.  JVP is normal, carotid upstrokes are normal without bruits, HEENT is normal, lungs are clear to auscultation bilaterally.  Heart is regular rate and rhythm with a 2/6 holosystolic murmur at the apex 2/6 low pitched diastolic murmur at the left lower sternal border.  Abdomen is soft and nontender with no masses, extremities trace pedal edema bilaterally.  EKG is reviewed and shows normal sinus rhythm 89 bpm with right bundle branch block and left axis deviation.  Personal review of her echocardiogram  demonstrates severe concentric left ventricular hypertrophy with a small LV chamber size, biatrial enlargement, and severe calcification of the mitral valve annulus with thickening of the mitral valve leaflets.  The patient's mean transvalvular gradient is 16 mmHg with a calculated mitral valve area less than 1 cm.  The patient presents with a cerebellar stroke now with good neurologic recovery and no significant residual deficit.  I agree that it is reasonable to evaluate her for cardiac source of embolus with a transesophageal echocardiogram.  I reviewed risks, indications, and alternatives of transesophageal echocardiography with the patient today.  She is willing to proceed and we will schedule this to be done tomorrow.  She will be n.p.o. after midnight.  I do not see any clear vegetation on her surface echo.  I think you should also have outpatient telemetry to evaluate for atrial fibrillation in the setting of left atrial enlargement and mitral stenosis.  She will have a 30-day event monitor for initial testing.  I agree with starting her on metoprolol to slow her heart rate which should allow for improved left ventricular filling.  Hydralazine will be stopped temporarily to evaluate her blood pressure response to the addition of metoprolol.  She is already treated with diltiazem.  Further recommendations pending her transesophageal echo results.  Sherren Mocha, M.D. 09/19/2020 4:47 PM

## 2020-09-19 NOTE — Progress Notes (Addendum)
Family Medicine Teaching Service Daily Progress Note Intern Pager: [phone]  Patient name: [name] Medical record number: [id number] Date of birth: September 30, 1976 Age: 44 y.o. Gender: female  Primary Care Provider: Leeanne Rio, MD Consultants: Neurology, cardiology Code Status: Full  Pt Overview and Major Events to Date:  Admitted 10/30  Assessment and Plan: [name] is a 44 y.o. female presenting with dizziness accompanied with vomiting and one episode of diarrheal incontinence. 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 PMH is significant for lupus, ESRD, HTN, and HLD.  Acute Cerebellar CVA  Pt suddenly felt dizzy while lying in bed on 10/29. This was accompanied by 4 episodes of vomiting and 1 episode of diarrheal incontinence. She went to [facility] and was diagnosed with vertigo, prescribed meclizine, scheduled for an outpatient MRI for 09/19/20 and was discharged. Her cousin picked her up and immediately brought her to [facility] ED.  Pt is still constantly dizzy and feels like the room is spinning around her counter-clockwise and she leans to the left when sitting up. She is oriented x4. MR Brain shows a subcentimeter acute infarct involving the midline inferior vermis and an age indeterminate infarct of the posterior left frontal lobe. Neurology evaluated patient for acute infarct. On exam, patient with profound dizziness with changes in position and lateral nystagmus. Neuro exam otherwise unremarkable. Will obtain risk stratification labs and evaluate for atherosclerosis.  -Neurology consulted, appreciate recommendations  - frequent neuro checks -SCDs for VTE prophylaxis -PT/OT/SLP eval and treat - Aspirin 81 mg -Vitals per unit routine  -Atorvastatin 40 mg -f/u TTE scheduled for today  Lupus  Diagnosed with Lupus in 2005. Takes 10 mg prednisone. Follows with Dr. Elpidio Galea. States her Lupus is in remission.  - Continue home medications    HTN  BP on admission  139/95. BP today 149/92. Takes Ditalazem 240 mg  XR, Hydralazine 25 mg BID.  -Hold home medications    ESRD  Anemia of Chronic Disease  Lupus related ESRD.  Dialysis on T/Th/Sat .  Per chart review, pt to have fistulogram with Dr. Carlis Abbott has right IJ tunneled catheter. Home medications include Ren-a-vit 800 mg, Phos-lo.  K 4.3, Cr 6.85, BUN 46, Hgb 10.4 and MCV 91.6. 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 Has seen a cardiologist with [facility] in [place]. Frequent PVCs seen on monitor here. Echocardiogram on  10/31 showed LVEF of 55 - 60%, severe concentric left ventricular hypertrophy, mildly reduced RV systolic function, severely dilated LA, mildly dilated RA, moderate thickening, calcification of mitral valve leaflets with moderate regurgitation and severe mitral stenosis. Severe tricuspid regurgitation. Mild calcification, thickening, and regurgitation of aortic valve.     -Cardiology consulted, appreciate recommendations - TEE scheduled for today  -No evidence of DVT with LE [place] -Diltiazem d/c  Thrombocytopenia Plt 78 on admission.  Baseline 110s in April 2021, appears chronic.  - blood smear  Transaminitis  AST 78 and ALT 105 on admission. ALT 84 on 10/31. Denies alcohol use or illicit drug use. Patient reports vomiting prior to admission (?viral gastritis). Hep B non-reactive on 10/31. - Consider hepatitis panel and/or RUQ [place] if not improving  Pre Diabetes ? -No home diabetic meds -HgbA1c 7.2, goal < 7.0 -CBG monitoring -PCP follow-up  Anxiety  Home medications include Paroxetine 40 mg daily.  -Continue home medication  Insomnia  Home medications  include Trazodone 100 mg nightly  -Continue at bedtime  GERD Home medications include Protonix 40 mg daily.  - Continue home medication    FEN/GI: NPO PPx: SCDs  Disposition:  Med-Tele  Subjective:  Pt states she feels less dizzy in general today but worse when sitting up. She admits to one episode of diarrhea last night. She denies abdominal pain, nausea, vomiting, chest pain, SOB.  She admits to a slight headache yesterday.    Objective: Temp:  [98 F (36.7 C)-98.8 F (37.1 C)] 98 F (36.7 C) (11/01 0350) Pulse Rate:  [98-101] 100 (11/01 0350) Resp:  [14-19] 19 (10/31 2034) BP: (134-149)/(80-98) 149/92 (11/01 0350) SpO2:  [99 %-100 %] 100 % (11/01 0350) Physical Exam: General: alert, no acute distress Cardiovascular: Murmur best heard at left upper sternal boarder, RRR, normal S1 and S2 Respiratory: CTA bilaterally  Abdomen: non tender, non distended  Extremities: edema of feet bilaterally, left worse.   Laboratory: Recent Labs  Lab 09/17/20 1303 09/17/20 1303 09/17/20 1311 09/18/20 0201 09/19/20 0242  WBC 12.1*  --   --  9.4 10.9*  HGB 10.9*   < > 12.6 11.3* 10.4*  HCT 35.1*   < > 37.0 35.7* 33.6*  PLT 78*  --   --  75* 74*   < > = values in this interval not displayed.   Recent Labs  Lab 09/17/20 1303 09/17/20 1303 09/17/20 1311 09/18/20 0201 09/19/20 0242  NA 136   < > 134* 135 134*  K 5.3*   < > 4.9 3.2* 4.3  CL 98   < > 97* 96* 97*  CO2 24  --   --  27 25  BUN 56*   < > 66* 18 46*  CREATININE 8.21*   < > 8.60* 4.24* 6.85*  CALCIUM 8.9  --   --  8.3* 8.1*  PROT 5.7*  --   --   --   --   BILITOT 0.9  --   --   --   --   ALKPHOS 123  --   --   --   --   ALT 105*  --   --  84*  --   AST 78*  --   --   --   --   GLUCOSE 170*   < > 166* 135* 174*   < > = values in this interval not displayed.      Imaging/Diagnostic Tests: CT ANGIO HEAD W OR WO CONTRAST  Result Date: 09/18/2020 CLINICAL DATA:  Small cerebellar stroke on MRI EXAM: CT ANGIOGRAPHY HEAD AND NECK TECHNIQUE: Multidetector CT imaging of the head and neck was performed using the standard protocol during bolus administration of intravenous contrast. Multiplanar CT  image reconstructions and MIPs were obtained to evaluate the vascular anatomy. Carotid stenosis measurements (when applicable) are obtained utilizing NASCET criteria, using the distal internal carotid diameter as the denominator. CONTRAST:  54m OMNIPAQUE IOHEXOL 350 MG/ML SOLN COMPARISON:  None. FINDINGS: CT HEAD Brain: There is a small area hypoattenuation corresponding to area of midline inferior cerebellar infarction on prior MRI. There is no acute intracranial hemorrhage or mass effect. No acute appearing loss of gray-white differentiation. Chronic left parietal infarct. There is no extra-axial fluid collection. Ventricles and sulci are within normal limits in size and configuration. Vascular: Better evaluated on CTA portion. Skull: Calvarium is unremarkable. Sinuses/Orbits: Bilateral proptosis.  No acute finding. Other: None. Review of the MIP images confirms the above findings CTA NECK Aortic arch:  Great vessel origins are patent. There is direct origin of the left vertebral artery from the arch. Right carotid system: Patent. Minimal calcified plaque at the ICA origin without measurable stenosis. Left carotid system: Patent. Mild calcified plaque at the ICA origin without measurable stenosis. Vertebral arteries: Patent.  Left vertebral artery is dominant. Skeleton: No significant abnormality. Other neck: No mass or adenopathy. Upper chest: No apical lung mass. Right IJ approach catheter enters the SVC. Review of the MIP images confirms the above findings CTA HEAD Anterior circulation: Intracranial internal carotid arteries are patent with calcified plaque. Moderate stenosis of the supraclinoid portions bilaterally. Anterior and middle cerebral arteries are patent. Posterior circulation: Intracranial vertebral arteries are patent. Right vertebral artery becomes diminutive after PICA origin. Both PICA origins are patent. Basilar artery is patent. Superior cerebellar artery origins are patent. Posterior cerebral  arteries are patent. Bilateral posterior communicating arteries are present with fetal origin of the left posterior cerebral artery. Venous sinuses: Patent as allowed by contrast bolus timing. Review of the MIP images confirms the above findings IMPRESSION: Small acute midline cerebellar infarct as seen on MRI. No new intracranial abnormality. No hemodynamically significant stenosis in the neck. No proximal intracranial vessel occlusion. 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 Electronically Signed   By: Macy Mis M.D.   On: 09/18/2020 08:01   CT ANGIO NECK W OR WO CONTRAST  Result Date: 09/18/2020 CLINICAL DATA:  Small cerebellar stroke on MRI EXAM: CT ANGIOGRAPHY HEAD AND NECK TECHNIQUE: Multidetector CT imaging of the head and neck was performed using the standard protocol during bolus administration of intravenous contrast. Multiplanar CT image reconstructions and MIPs were obtained to evaluate the vascular anatomy. Carotid stenosis measurements (when applicable) are obtained utilizing NASCET criteria, using the distal internal carotid diameter as the denominator. CONTRAST:  31m OMNIPAQUE IOHEXOL 350 MG/ML SOLN COMPARISON:  None. FINDINGS: CT HEAD Brain: There is a small area hypoattenuation corresponding to area of midline inferior cerebellar infarction on prior MRI. There is no acute intracranial hemorrhage or mass effect. No acute appearing loss of gray-white differentiation. Chronic left parietal infarct. There is no extra-axial fluid collection. Ventricles and sulci are within normal limits in size and configuration. Vascular: Better evaluated on CTA portion. Skull: Calvarium is unremarkable. Sinuses/Orbits: Bilateral proptosis.  No acute finding. Other: None. Review of the MIP images confirms the above findings CTA NECK Aortic arch: Great vessel origins are patent. There is direct origin of the left vertebral artery from the arch. Right carotid system:  Patent. Minimal calcified plaque at the ICA origin without measurable stenosis. Left carotid system: Patent. Mild calcified plaque at the ICA origin without measurable stenosis. Vertebral arteries: Patent.  Left vertebral artery is dominant. Skeleton: No significant abnormality. Other neck: No mass or adenopathy. Upper chest: No apical lung mass. Right IJ approach catheter enters the SVC. Review of the MIP images confirms the above findings CTA HEAD Anterior circulation: Intracranial internal carotid arteries are patent with calcified plaque. Moderate stenosis of the supraclinoid portions bilaterally. Anterior and middle cerebral arteries are patent. Posterior circulation: Intracranial vertebral arteries are patent. Right vertebral artery becomes diminutive after PICA origin. Both PICA origins are patent. Basilar artery is patent. Superior cerebellar artery origins are patent. Posterior cerebral arteries are patent. Bilateral posterior communicating arteries are present with fetal origin of the left posterior cerebral artery. Venous sinuses: Patent as allowed by contrast bolus timing. Review of the MIP images confirms the above findings IMPRESSION: Small acute midline cerebellar infarct  as seen on MRI. No new intracranial abnormality. No hemodynamically significant stenosis in the neck. No proximal intracranial vessel occlusion. 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 Electronically Signed   By: Macy Mis M.D.   On: 09/18/2020 08:01   MR BRAIN WO CONTRAST  Result Date: 09/17/2020 CLINICAL DATA:  Dizziness EXAM: MRI HEAD WITHOUT CONTRAST TECHNIQUE: Multiplanar, multiecho pulse sequences of the brain and surrounding structures were obtained without intravenous contrast. COMPARISON:  None. FINDINGS: Motion artifact is present. Brain: There is an 8 mm focus of mildly reduced diffusion involving the midline inferior vermis. Chronic left parietal infarct with chronic blood  products. There is no intracranial mass, mass effect, or edema. There is no hydrocephalus or extra-axial fluid collection. Ventricles and sulci are normal in size and configuration. Vascular: Major vessel flow voids at the skull base are preserved. Skull and upper cervical spine: Normal marrow signal is preserved. Sinuses/Orbits: Paranasal sinuses are aerated. Orbits are unremarkable. Other: Sella is unremarkable.  Mastoid air cells are clear. IMPRESSION: Motion degraded. Subcentimeter acute infarct involving the midline inferior vermis. Chronic left parietal infarct. Electronically Signed   By: Macy Mis M.D.   On: 09/17/2020 13:59   VAS US DUPLEX DIALYSIS ACCESS (AVF,AVG)  Result Date: 09/09/2020 DIALYSIS ACCESS Reason for Exam: Bruising on right fistula site, arm swelling. Access Site: Right Upper Extremity. Access Type: 2nd stage basilic vein transposition 07/01/2020. Performing Technologist: Ronal Fear RVS, RCS  Examination Guidelines: A complete evaluation includes B-mode imaging, spectral Doppler, color Doppler, and power Doppler as needed of all accessible portions of each vessel. Unilateral testing is considered an integral part of a complete examination. Limited examinations for reoccurring indications may be performed as noted.  Findings: +--------------------+----------+-----------------+--------+ AVF                 PSV (cm/s)Flow Vol (mL/min)Comments +--------------------+----------+-----------------+--------+ Native artery inflow   182           669                +--------------------+----------+-----------------+--------+ AVF Anastomosis        285                              +--------------------+----------+-----------------+--------+  +------------+----------+-------------+----------+-----------------------------+ OUTFLOW VEINPSV (cm/s)Diameter (cm)Depth (cm)          Describe             +------------+----------+-------------+----------+-----------------------------+ Shoulder       381        0.77        0.75                                 +------------+----------+-------------+----------+-----------------------------+ Prox UA         95        0.70        0.18   change in diameter to 0.39 cm                                                       & 475 cm/s           +------------+----------+-------------+----------+-----------------------------+ Mid UA         103  1.17        0.16        partially-occlusive      +------------+----------+-------------+----------+-----------------------------+ Dist UA        578        0.70        0.31                                 +------------+----------+-------------+----------+-----------------------------+ AC Fossa       422        0.55        0.81        partially-occlusive      +------------+----------+-------------+----------+-----------------------------+   Summary: Patent arteriovenous fistula with an area of dilatation and mild thrombus involving the mid segment of the basilic outflow vein. Mild thrombus at the anastomosis. Diameter change in the shoulder/proximal upper arm segment of the basilic outflow vein with increased velocity of 475 cm/s. *See table(s) above for measurements and observations.  Diagnosing physician: Servando Snare MD Electronically signed by Servando Snare MD on 09/09/2020 at 2:32:48 PM.    --------------------------------------------------------------------------------   Final    ECHOCARDIOGRAM COMPLETE  Result Date: 09/18/2020    ECHOCARDIOGRAM REPORT   Patient Name:   [name] Date of Exam: 09/18/2020 Medical Rec #:  [id number]          Height:       64.0 in Accession #:    [id number]         Weight:       135.6 lb Date of Birth:  23-Jul-1976           BSA:          1.658 m Patient Age:    13 years           BP:           134/97 mmHg Patient Gender: F                  HR:            87 bpm. Exam Location:  Inpatient Procedure: 2D Echo, Cardiac Doppler and Color Doppler Indications:    Stroke  History:        Patient has prior history of Echocardiogram examinations, most                 recent 12/18/2017. Mitral Valve Disease; Risk                 Factors:Hypertension. ESRD. Lupus.  Sonographer:    Clayton Lefort RDCS (AE) Referring Phys: [id number] Roundup  1. Left ventricular ejection fraction, by estimation, is 55 to 60%. The left ventricle has normal function. The left ventricle has no regional wall motion abnormalities. There is severe concentric left ventricular hypertrophy. Diastolic function indeterminant due to moderate-to-severe MAC.  2. Right ventricular systolic function is mildly reduced. The right ventricular size is normal. There is moderately elevated pulmonary artery systolic pressure.  3. Left atrial size was severely dilated.  4. Right atrial size was mildly dilated.  5. The mitral valve is degenerative. Moderate mitral valve regurgitation. Severe mitral stenosis with mean gradient 33mHg and MVA of 0.73cm2 by continuity equation at HR 88bpm. Moderate to severe mitral annular calcification.  6. Tricuspid valve regurgitation is severe.  7. The aortic valve is tricuspid. There is mild calcification of the aortic valve. There is mild thickening of the aortic valve. Aortic valve  regurgitation is mild. Mild to moderate aortic valve sclerosis/calcification is present, without any evidence of aortic stenosis.  8. The inferior vena cava is normal in size with <50% respiratory variability, suggesting right atrial pressure of 8 mmHg. Comparison(s): Compared to prior study on 11/2017, there is now severe calcific mitral stenosis and severe tricuspid regurgitation. FINDINGS  Left Ventricle: Left ventricular ejection fraction, by estimation, is 55 to 60%. The left ventricle has normal function. The left ventricle has no regional wall motion abnormalities. The left  ventricular internal cavity size was normal in size. There is  severe concentric left ventricular hypertrophy. Indeterminant due to moderate-to-severe MAC. Right Ventricle: The right ventricular size is normal. Right vetricular wall thickness was not well visualized. Right ventricular systolic function is mildly reduced. There is moderately elevated pulmonary artery systolic pressure. The tricuspid regurgitant velocity is 3.51 m/s, and with an assumed right atrial pressure of 8 mmHg, the estimated right ventricular systolic pressure is 07.3 mmHg. Left Atrium: Left atrial size was severely dilated. Right Atrium: Right atrial size was mildly dilated. Pericardium: There is no evidence of pericardial effusion. Mitral Valve: The mitral valve is degenerative in appearance. There is moderate thickening of the mitral valve leaflet(s). There is moderate calcification of the mitral valve leaflet(s). Moderate to severe mitral annular calcification. Moderate mitral valve regurgitation. Severe mitral stenosis with mean gradient 64mHg and MVA of 0.73cm2 by continuity equation. Tricuspid Valve: The tricuspid valve is normal in structure. Tricuspid valve regurgitation is severe. No evidence of tricuspid stenosis. Aortic Valve: The aortic valve is tricuspid. There is mild calcification of the aortic valve. There is mild thickening of the aortic valve. Aortic valve regurgitation is mild. Aortic regurgitation PHT measures 816 msec. Mild to moderate aortic valve sclerosis/calcification is present, without any evidence of aortic stenosis. Aortic valve mean gradient measures 4.0 mmHg. Aortic valve peak gradient measures 8.1 mmHg. Aortic valve area, by VTI measures 2.21 cm. Pulmonic Valve: The pulmonic valve was normal in structure. Pulmonic valve regurgitation is not visualized. No evidence of pulmonic stenosis. Aorta: The aortic root and ascending aorta are structurally normal, with no evidence of dilitation. Venous: The inferior vena  cava is normal in size with less than 50% respiratory variability, suggesting right atrial pressure of 8 mmHg. IAS/Shunts: No atrial level shunt detected by color flow Doppler.  LEFT VENTRICLE PLAX 2D LVIDd:         2.50 cm  Diastology LVIDs:         1.80 cm  LV e' medial:    5.25 cm/s LV PW:         1.80 cm  LV E/e' medial:  36.8 LV IVS:        2.00 cm  LV e' lateral:   3.98 cm/s LVOT diam:     1.90 cm  LV E/e' lateral: 48.5 LV SV:         54 LV SV Index:   32 LVOT Area:     2.84 cm  RIGHT VENTRICLE             IVC RV Basal diam:  3.50 cm     IVC diam: 1.70 cm RV S prime:     10.30 cm/s TAPSE (M-mode): 1.7 cm LEFT ATRIUM             Index       RIGHT ATRIUM           Index LA diam:        3.80 cm 2.29  cm/m  RA Area:     16.80 cm LA Vol (A2C):   77.5 ml 46.73 ml/m RA Volume:   46.20 ml  27.86 ml/m LA Vol (A4C):   71.0 ml 42.81 ml/m LA Biplane Vol: 74.8 ml 45.10 ml/m  AORTIC VALVE AV Area (Vmax):    2.40 cm AV Area (Vmean):   2.45 cm AV Area (VTI):     2.21 cm AV Vmax:           142.00 cm/s AV Vmean:          99.000 cm/s AV VTI:            0.244 m AV Peak Grad:      8.1 mmHg AV Mean Grad:      4.0 mmHg LVOT Vmax:         120.00 cm/s LVOT Vmean:        85.400 cm/s LVOT VTI:          0.190 m LVOT/AV VTI ratio: 0.78 AI PHT:            816 msec  AORTA Ao Root diam: 2.90 cm Ao Asc diam:  3.00 cm MITRAL VALVE                 TRICUSPID VALVE MV Area (PHT): 1.98 cm      TR Peak grad:   49.3 mmHg MV Peak grad:  26.8 mmHg     TR Vmax:        351.00 cm/s MV Mean grad:  16.0 mmHg MV Vmax:       2.59 m/s      SHUNTS MV Vmean:      188.0 cm/s    Systemic VTI:  0.19 m MV Decel Time: 383 msec      Systemic Diam: 1.90 cm MR Peak grad:    120.6 mmHg MR Mean grad:    84.0 mmHg MR Vmax:         549.00 cm/s MR Vmean:        437.0 cm/s MR PISA:         2.26 cm MR PISA Eff ROA: 16 mm MR PISA Radius:  0.60 cm MV E velocity: 193.00 cm/s MV A velocity: 221.00 cm/s MV E/A ratio:  0.87 Gwyndolyn Kaufman MD Electronically signed by  Gwyndolyn Kaufman MD Signature Date/Time: 09/18/2020/1:21:39 PM    Final    VAS [place] LOWER EXTREMITY VENOUS (DVT)  Result Date: 09/18/2020  Lower Venous DVT Study Indications: Stroke, and Lupus.  Comparison Study: No prior study Performing Technologist: Sharion Dove RVS  Examination Guidelines: A complete evaluation includes B-mode imaging, spectral Doppler, color Doppler, and power Doppler as needed of all accessible portions of each vessel. Bilateral testing is considered an integral part of a complete examination. Limited examinations for reoccurring indications may be performed as noted. The reflux portion of the exam is performed with the patient in reverse Trendelenburg.  +---------+---------------+---------+-----------+----------+--------------+ RIGHT    CompressibilityPhasicitySpontaneityPropertiesThrombus Aging +---------+---------------+---------+-----------+----------+--------------+ CFV      Full           Yes      Yes                                 +---------+---------------+---------+-----------+----------+--------------+ SFJ      Full                                                        +---------+---------------+---------+-----------+----------+--------------+  FV Prox  Full                                                        +---------+---------------+---------+-----------+----------+--------------+ FV Mid   Full                                                        +---------+---------------+---------+-----------+----------+--------------+ FV DistalFull                                                        +---------+---------------+---------+-----------+----------+--------------+ PFV      Full                                                        +---------+---------------+---------+-----------+----------+--------------+ POP      Full           Yes      Yes                                  +---------+---------------+---------+-----------+----------+--------------+ PTV      Full                                                        +---------+---------------+---------+-----------+----------+--------------+ PERO     Full                                                        +---------+---------------+---------+-----------+----------+--------------+   +---------+---------------+---------+-----------+----------+--------------+ LEFT     CompressibilityPhasicitySpontaneityPropertiesThrombus Aging +---------+---------------+---------+-----------+----------+--------------+ CFV      Full           Yes      Yes                                 +---------+---------------+---------+-----------+----------+--------------+ SFJ      Full                                                        +---------+---------------+---------+-----------+----------+--------------+ FV Prox  Full                                                        +---------+---------------+---------+-----------+----------+--------------+  FV Mid   Full                                                        +---------+---------------+---------+-----------+----------+--------------+ FV DistalFull                                                        +---------+---------------+---------+-----------+----------+--------------+ PFV      Full                                                        +---------+---------------+---------+-----------+----------+--------------+ POP      Full           Yes      Yes                                 +---------+---------------+---------+-----------+----------+--------------+ PTV      Full                                                        +---------+---------------+---------+-----------+----------+--------------+ PERO     Full                                                         +---------+---------------+---------+-----------+----------+--------------+     Summary: BILATERAL: - No evidence of deep vein thrombosis seen in the lower extremities, bilaterally. -   *See table(s) above for measurements and observations. Electronically signed by [name] MD on 09/18/2020 at 2:08:18 PM.    Final     Micheline Rough, Medical Student 09/19/2020, 7:27 AM OMS-IV AI, [place] Intern pager: [phone], text pages welcome  Resident Attestation  I saw and evaluated the patient, performing the key elements of the service.I  personally performed or re-performed the history, physical exam, and medical decision making activities of this service and have verified that the service and findings are accurately documented in the student's note. I developed the management plan that is described in the medical student's note, and I agree with the content, with my edits above.    Gifford Shave, PGY2

## 2020-09-20 ENCOUNTER — Encounter (HOSPITAL_COMMUNITY)
Admission: EM | Disposition: A | Discharge status: Rehabilitation Facility | Payer: Self-pay | Source: Home / Self Care | Attending: Family Medicine

## 2020-09-20 ENCOUNTER — Inpatient Hospital Stay (HOSPITAL_COMMUNITY): Payer: Medicare Other | Admitting: Anesthesiology

## 2020-09-20 ENCOUNTER — Inpatient Hospital Stay (HOSPITAL_COMMUNITY)
Admission: EM | Admit: 2020-09-20 | Discharge: 2020-09-20 | Disposition: A | Discharge status: Home / Self Care | Payer: Medicare Other | Source: Home / Self Care | Attending: Neurology | Admitting: Neurology

## 2020-09-20 DIAGNOSIS — I639 Cerebral infarction, unspecified: Secondary | ICD-10-CM | POA: Diagnosis not present

## 2020-09-20 DIAGNOSIS — R110 Nausea: Secondary | ICD-10-CM

## 2020-09-20 DIAGNOSIS — I351 Nonrheumatic aortic (valve) insufficiency: Secondary | ICD-10-CM

## 2020-09-20 DIAGNOSIS — R42 Dizziness and giddiness: Secondary | ICD-10-CM

## 2020-09-20 DIAGNOSIS — I342 Nonrheumatic mitral (valve) stenosis: Secondary | ICD-10-CM

## 2020-09-20 DIAGNOSIS — I361 Nonrheumatic tricuspid (valve) insufficiency: Secondary | ICD-10-CM | POA: Diagnosis not present

## 2020-09-20 DIAGNOSIS — I340 Nonrheumatic mitral (valve) insufficiency: Secondary | ICD-10-CM | POA: Diagnosis not present

## 2020-09-20 HISTORY — PX: BUBBLE STUDY: SHX6837

## 2020-09-20 HISTORY — PX: TEE WITHOUT CARDIOVERSION: SHX5443

## 2020-09-20 LAB — COMPREHENSIVE METABOLIC PANEL
ALT: 40 U/L (ref 0–44)
AST: 16 U/L (ref 15–41)
Albumin: 2.7 g/dL — ABNORMAL LOW (ref 3.5–5.0)
Alkaline Phosphatase: 100 U/L (ref 38–126)
Anion gap: 15 (ref 5–15)
BUN: 71 mg/dL — ABNORMAL HIGH (ref 6–20)
CO2: 22 mmol/L (ref 22–32)
Calcium: 8 mg/dL — ABNORMAL LOW (ref 8.9–10.3)
Chloride: 96 mmol/L — ABNORMAL LOW (ref 98–111)
Creatinine, Ser: 9.01 mg/dL — ABNORMAL HIGH (ref 0.44–1.00)
GFR, Estimated: 5 mL/min — ABNORMAL LOW (ref >60)
Glucose, Bld: 156 mg/dL — ABNORMAL HIGH (ref 70–99)
Potassium: 5.2 mmol/L — ABNORMAL HIGH (ref 3.5–5.1)
Sodium: 133 mmol/L — ABNORMAL LOW (ref 135–145)
Total Bilirubin: 0.5 mg/dL (ref 0.3–1.2)
Total Protein: 5.3 g/dL — ABNORMAL LOW (ref 6.5–8.1)

## 2020-09-20 LAB — HOMOCYSTEINE: Homocysteine: 18.9 umol/L — ABNORMAL HIGH (ref 0.0–14.5)

## 2020-09-20 LAB — CBC
HCT: 32.7 % — ABNORMAL LOW (ref 36.0–46.0)
Hemoglobin: 10.3 g/dL — ABNORMAL LOW (ref 12.0–15.0)
MCH: 28.4 pg (ref 26.0–34.0)
MCHC: 31.5 g/dL (ref 30.0–36.0)
MCV: 90.1 fL (ref 80.0–100.0)
Platelets: 75 10*3/uL — ABNORMAL LOW (ref 150–400)
RBC: 3.63 MIL/uL — ABNORMAL LOW (ref 3.87–5.11)
RDW: 18.8 % — ABNORMAL HIGH (ref 11.5–15.5)
WBC: 10.3 10*3/uL (ref 4.0–10.5)
nRBC: 0 % (ref 0.0–0.2)

## 2020-09-20 SURGERY — ECHOCARDIOGRAM, TRANSESOPHAGEAL
Anesthesia: Monitor Anesthesia Care

## 2020-09-20 MED ORDER — SODIUM CHLORIDE 0.9 % IV SOLN
INTRAVENOUS | Status: AC | PRN
  Administered 2020-09-20: 500 mL via INTRAMUSCULAR

## 2020-09-20 MED ORDER — ASPIRIN EC 325 MG PO TBEC
325.0000 mg | DELAYED_RELEASE_TABLET | Freq: Every day | ORAL | Status: DC
  Administered 2020-09-21 – 2020-09-23 (×3): 325 mg via ORAL
  Filled 2020-09-20 (×3): qty 1

## 2020-09-20 MED ORDER — DILTIAZEM HCL ER COATED BEADS 240 MG PO CP24
240.0000 mg | ORAL_CAPSULE | Freq: Every day | ORAL | Status: DC
  Administered 2020-09-20 – 2020-09-21 (×2): 240 mg via ORAL
  Filled 2020-09-20 (×2): qty 1

## 2020-09-20 MED ORDER — METOPROLOL TARTRATE 25 MG PO TABS: 25.0000 mg | ORAL_TABLET | Freq: Two times a day (BID) | ORAL | Status: DC

## 2020-09-20 MED ORDER — PROPOFOL 500 MG/50ML IV EMUL
INTRAVENOUS | Status: DC | PRN
  Administered 2020-09-20: 20 mg via INTRAVENOUS
  Administered 2020-09-20: 75 ug/kg/min via INTRAVENOUS

## 2020-09-20 MED ORDER — ONDANSETRON HCL 4 MG/2ML IJ SOLN
INTRAMUSCULAR | Status: DC | PRN
  Administered 2020-09-20: 4 mg via INTRAVENOUS

## 2020-09-20 MED ORDER — METOPROLOL TARTRATE 50 MG PO TABS
50.0000 mg | ORAL_TABLET | Freq: Two times a day (BID) | ORAL | Status: DC
  Administered 2020-09-20 – 2020-09-21 (×3): 50 mg via ORAL
  Filled 2020-09-20 (×3): qty 1

## 2020-09-20 MED ORDER — LIDOCAINE HCL (CARDIAC) PF 100 MG/5ML IV SOSY
PREFILLED_SYRINGE | INTRAVENOUS | Status: DC | PRN
  Administered 2020-09-20: 40 mg via INTRAVENOUS

## 2020-09-20 MED ORDER — BUTAMBEN-TETRACAINE-BENZOCAINE 2-2-14 % EX AERO
INHALATION_SPRAY | CUTANEOUS | Status: DC | PRN
  Administered 2020-09-20: 2 via TOPICAL

## 2020-09-20 NOTE — Progress Notes (Signed)
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 PMhx: ESRD, SLE, anemia, thrombocytopenia, HTN, HLD    PT Comments    Pt reports improved vision this date, but she continues to ambulate at a slow pace and resist changing head positions during gait. When she would change head positions she would decrease her speed or lose balance, with minA to recover. Her reliance on her vision for balance was further supported when she attempted to stand in semi-tandem stance without UE support and was able to do so for > 10 seconds with eyes open but was unable to maintain her balance > 1 second with her eyes closed. She would benefit from further skilled PT services acutely and in the CIR setting to address her deficits mentioned below to maximize her independence and safety with functional mobility in order to return home.   Follow Up Recommendations  CIR;Supervision for mobility/OOB     Equipment Recommendations  Other (comment) (TBD)    Recommendations for Other Services       Precautions / Restrictions Precautions Precautions: Fall Restrictions Weight Bearing Restrictions: No    Mobility  Bed Mobility Overal bed mobility: Modified Independent Bed Mobility: Supine to Sit;Sit to Supine     Supine to sit: Modified independent (Device/Increase time);HOB elevated Sit to supine: Modified independent (Device/Increase time);HOB elevated   General bed mobility comments: Extra time, but able to come to sit EOB with HOB elevated safely.  Transfers Overall transfer level: Needs assistance Equipment used: Straight cane Transfers: Sit to/from Stand Sit to Stand: Min assist         General transfer comment: STS from EOB with slightly increased time to power up to stand, but no  overt LOB. MinA for safety as unsteadiness was noted.  Ambulation/Gait Ambulation/Gait assistance: Min assist Gait Distance (Feet): 200 Feet Assistive device: Straight cane Gait Pattern/deviations: Step-through pattern;Decreased step length - right Gait velocity: decreased Gait velocity interpretation: <1.8 ft/sec, indicate of risk for recurrent falls General Gait Details: Utilized SPC in R hand, cuing pt for proper gait pattern with use of cane with success. Initially, demonstrated step-to gait pattern with R LE but corrected once provided cues. Cued pt to turn head laterally to look for objects, with gait speed decreasing and trunk sway increasing when attempting to change head positions during gait. 1x minor LOB with minA to recover.   Stairs             Wheelchair Mobility    Modified Rankin (Stroke Patients Only) Modified Rankin (Stroke Patients Only) Pre-Morbid Rankin Score: Slight disability Modified Rankin: Moderately severe disability     Balance Overall balance assessment: Needs assistance Sitting-balance support: No upper extremity supported;Feet supported Sitting balance-Leahy Scale: Good Sitting balance - Comments: Able to maintain static sitting balance EOB without trunk sway or LOB.   Standing balance support: No upper extremity supported Standing balance-Leahy Scale: Fair Standing balance comment: Standing statically at sink, cuing pt to remove hand support with feet in different positions. Able to maintain balance in semi-tandem stance for > 10 sec with min guard and eyes open, but LOB once eyes closed after ~1 second, min A to recover. Attempted tandem stance, but required 1 hand support to maintain balance >2 seconds with eyes open. Attempted SLS on L with pt able to maintain balance < 1  second without UE support and eyes open.                            Cognition Arousal/Alertness: Awake/alert Behavior During Therapy: WFL for tasks  assessed/performed Overall Cognitive Status: Within Functional Limits for tasks assessed                                        Exercises      General Comments        Pertinent Vitals/Pain Pain Assessment: Faces Faces Pain Scale: No hurt Pain Intervention(s): Monitored during session;Limited activity within patient's tolerance    Home Living                      Prior Function            PT Goals (current goals can now be found in the care plan section) Acute Rehab PT Goals Patient Stated Goal: to go home and get better PT Goal Formulation: With patient Time For Goal Achievement: 10/02/20 Potential to Achieve Goals: Good Progress towards PT goals: Progressing toward goals    Frequency    Min 4X/week      PT Plan Discharge plan needs to be updated    Co-evaluation              AM-PAC PT "6 Clicks" Mobility   Outcome Measure  Help needed turning from your back to your side while in a flat bed without using bedrails?: None Help needed moving from lying on your back to sitting on the side of a flat bed without using bedrails?: A Little Help needed moving to and from a bed to a chair (including a wheelchair)?: A Little Help needed standing up from a chair using your arms (e.g., wheelchair or bedside chair)?: A Little Help needed to walk in hospital room?: A Little Help needed climbing 3-5 steps with a railing? : A Lot 6 Click Score: 18    End of Session Equipment Utilized During Treatment: Gait belt Activity Tolerance: Patient tolerated treatment well Patient left: in bed;with call bell/phone within reach;with bed alarm set Nurse Communication: Mobility status (nurse cleared pt for session) PT Visit Diagnosis: Other abnormalities of gait and mobility (R26.89);Difficulty in walking, not elsewhere classified (R26.2);Other symptoms and signs involving the nervous system (R29.898);Unsteadiness on feet (R26.81)     Time:  7106-2694 PT Time Calculation (min) (ACUTE ONLY): 18 min  Charges:  $Gait Training: 8-22 mins                     Moishe Spice, PT, DPT Acute Rehabilitation Services  Pager: [phone] Office: [place] 09/20/2020, 3:44 PM

## 2020-09-20 NOTE — Consult Note (Signed)
Physical Medicine and Rehabilitation Consult Reason for Consult: Dizziness/vertigo with decreased functional mobility Referring Physician: [facility]  HPI: [name] is a 44 y.o. 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  History taken from chart review and patient.  Patient lives alone.  1 level home.  Independent with assistive device.  Patient does not drive and receives transportation via a neighbor.  She states she has a niece that came up with her if needed.  She presented on 09/17/20 with dizziness, nausea, vomitting as well as stool incontinence.  She initially presented to [facility] urgent care dizziness and provided antiemetics, diagnosed with classic vertigo discharged to home.  MRI of the brain showed subcentimeter acute infarct involving the midline inferior vermis.  Chronic left parietal infarct.  CT angiogram of head and neck no hemodynamically significant stenosis in the neck.  No proximal intracranial vessel occlusion.  Admission chemistries potassium 5.3, glucose 170, BUN 56, creatinine 8.21, AST 78, ALT 105, alcohol negative, WBC 12,100, hemoglobin 10.9.  Echocardiogram with severe left atrial dilation and mitral stenosis in the setting of significant annular calcifications..  TEE showed normal left ventricular function no AAS or thrombi.  Currently maintained on aspirin for CVA prophylaxis.  Hemodialysis ongoing as per renal services.  Patient is scheduled for an outpatient fistulogram.  She is tolerating a regular diet.  Therapy evaluations completed with recommendations of physical medicine rehab consult.  Review of Systems  Constitutional: Positive for malaise/fatigue. Negative for chills and fever.  HENT: Negative for hearing loss.   Eyes: Negative for blurred vision and double vision.  Respiratory: Negative for cough and shortness of  breath.   Cardiovascular: Positive for leg swelling. Negative for chest pain and palpitations.  Gastrointestinal: Positive for nausea and vomiting.       GERD  Genitourinary: Negative for dysuria, flank pain and hematuria.  Musculoskeletal: Positive for joint pain and myalgias.  Skin: Negative for rash.  Neurological: Positive for dizziness, weakness and headaches. Negative for sensory change.  Psychiatric/Behavioral: Positive for depression.       Anxiety  All other systems reviewed and are negative.  Past Medical History:  Diagnosis Date  . Allergy   . Anemia   . Anxiety   . Arthritis   . Avascular necrosis of bone (HCC)    left tibial talus due to chronic prednisone use  . Cerebellar stroke ([place])   . Depression   . Dyspnea    too much  fluid  . ESRD (end stage renal disease) on dialysis ([place])    tthsat [place]  . GERD (gastroesophageal reflux disease)   . Headache(784.0)   . History of high blood pressure   . Hyperlipemia   . Hypertension   . Lupus ([place])   . Pneumonia   . Secondary hyperparathyroidism (of renal origin)    Past Surgical History:  Procedure Laterality Date  . A/V FISTULAGRAM Left 12/02/2017   Procedure: A/V FISTULAGRAM - Left upper;  Surgeon: Angelia Mould, MD;  Location: [place] [facility];  Service: Cardiovascular;  Laterality: Left;  . AV FISTULA PLACEMENT Left   . AV FISTULA PLACEMENT Right 01/26/2014   Procedure: ARTERIOVENOUS (AV) FISTULA CREATION; ultrasound guided;  Surgeon: Conrad McDonald, MD;  Location: [place];  Service: Vascular;  Laterality: Right;  . AV FISTULA PLACEMENT Right 04/25/2020   Procedure: RIGHT UPPER ARM  BASILIC VEIN ARTERIOVENOUS (AV) FISTULA;  Surgeon: [name], MD;  Location: [place];  Service: Vascular;  Laterality: Right;  . BASCILIC VEIN TRANSPOSITION Right 07/01/2020   Procedure: SECOND STAGE BASCILIC VEIN TRANSPOSITION RIGHT UPPER EXTREMITY;  Surgeon: [name], MD;  Location: [facility];  Service:  Vascular;  Laterality: Right;  . ESOPHAGOGASTRODUODENOSCOPY N/A 03/17/2016   Procedure: ESOPHAGOGASTRODUODENOSCOPY (EGD);  Surgeon: Irene Shipper, MD;  Location: [facility] ENDOSCOPY;  Service: Endoscopy;  Laterality: N/A;  . FISTULOGRAM Left 09/24/2019   Procedure: FISTULOGRAM LEFT ARM;  Surgeon: [name], MD;  Location: [place];  Service: Vascular;  Laterality: Left;  . HEMATOMA EVACUATION Left 10/09/2013   Procedure: EVACUATION HEMATOMA;  Surgeon: Angelia Mould, MD;  Location: [place];  Service: Vascular;  Laterality: Left;  . HEMATOMA EVACUATION Left 09/24/2019   Procedure: EVACUATION HEMATOMA  LEFT ARM;  Surgeon: [name], MD;  Location: [place];  Service: Vascular;  Laterality: Left;  . INCISION AND DRAINAGE ABSCESS     gluteal  . INSERTION OF DIALYSIS CATHETER N/A 10/09/2013   Procedure: INSERTION OF DIALYSIS CATHETER; ULTRASOUND GUIDED;  Surgeon: Angelia Mould, MD;  Location: [place];  Service: Vascular;  Laterality: N/A;  . INSERTION OF DIALYSIS CATHETER Left 09/24/2019   Procedure: INSERTION OF TUNNELLED DIALYSIS CATHETER - PALINDROME 15FR X 28CM;  Surgeon: [name], MD;  Location: [place];  Service: Vascular;  Laterality: Left;  . INSERTION OF DIALYSIS CATHETER Right 03/15/2020   Procedure: Insertion Of Dialysis Catheter;  Surgeon: Angelia Mould, MD;  Location: [place];  Service: Cardiovascular;  Laterality: Right;  . PERIPHERAL VASCULAR BALLOON ANGIOPLASTY Left 12/02/2017   Procedure: PERIPHERAL VASCULAR BALLOON ANGIOPLASTY;  Surgeon: Angelia Mould, MD;  Location: [place] [facility];  Service: Cardiovascular;  Laterality: Left;  . REVISION OF ARTERIOVENOUS GORETEX GRAFT Left 04/03/2019   Procedure: REVISION OF ARTERIOVENOUS GORETEX GRAFT PSEUDOANEURYSM;  Surgeon: Angelia Mould, MD;  Location: [place];  Service: Vascular;  Laterality: Left;  . SHUNTOGRAM N/A 01/07/2014   Procedure: fistulogram with possibe venoplasty left upper arm avg;   Surgeon: Angelia Mould, MD;  Location: [facility] CATH LAB;  Service: Cardiovascular;  Laterality: N/A;  . THROMBECTOMY AND REVISION OF ARTERIOVENTOUS (AV) GORETEX  GRAFT Left 03/15/2020   Procedure: ATTEMPTED THROMBECTOMY OF LEFT ARM ARTERIOVENTOUS (AV) GORETEX  GRAFT;  Surgeon: Angelia Mould, MD;  Location: [place];  Service: Cardiovascular;  Laterality: Left;  . tibiocalcaneal fusion  left Left   . ULTRASOUND GUIDANCE FOR VASCULAR ACCESS  09/24/2019   Procedure: Ultrasound Guidance For Vascular Access;  Surgeon: [name], MD;  Location: [facility] OR;  Service: Vascular;;   Family History  Problem Relation Age of Onset  . Asthma Sister   . Hypertension Mother   . Heart attack Mother 3       died at 71  . COPD Maternal Uncle        prostate  . Mental illness Neg Hx    Social History:  reports that she has never smoked. She has never used smokeless tobacco. She reports that she does not drink alcohol and does not use drugs. Allergies:  Allergies  Allergen Reactions  . Amlodipine Hives and Swelling  . Sulfa Antibiotics Hives and Itching  . Vancomycin Hives and Itching  . Venlafaxine Other (See Comments)    Emotional   Medications Prior to Admission  Medication Sig Dispense Refill  . acetaminophen (TYLENOL) 500 MG tablet Take 1,000 mg by mouth every 6 (six) hours  as needed for moderate pain or headache.     . b complex-vitamin c-folic acid (NEPHRO-VITE) 0.8 MG TABS tablet Take 1 tablet by mouth daily at 12 noon.    . calcium acetate, Phos Binder, (PHOSLYRA) 667 MG/5ML SOLN Take 2,001 mg by mouth See admin instructions. Take 15 mls (2001 mg) by mouth with each meal and snack - up to 9 times daily    . Carboxymethylcellul-Glycerin (LUBRICATING EYE DROPS OP) Place 1 drop into both eyes daily as needed (dry eyes).    Marland Kitchen diltiazem (TIAZAC) 240 MG 24 hr capsule Take 240 mg by mouth at bedtime.     . hydrALAZINE (APRESOLINE) 25 MG tablet Take 25 mg by mouth in the morning and at  bedtime.    . pantoprazole (PROTONIX) 40 MG tablet Take 1 tablet (40 mg total) by mouth daily. 30 tablet 3  . PARoxetine (PAXIL) 40 MG tablet Take 40 mg by mouth daily.    . predniSONE (DELTASONE) 10 MG tablet Take 1 tablet (10 mg total) by mouth daily with breakfast. 90 tablet 3  . traZODone (DESYREL) 100 MG tablet Take 100 mg by mouth at bedtime.    . clopidogrel (PLAVIX) 75 MG tablet Take 75 mg by mouth daily.    Marland Kitchen HYDROcodone-acetaminophen (NORCO/VICODIN) 5-325 MG tablet Take 1 tablet by mouth every 6 (six) hours as needed for moderate pain. (Patient not taking: Reported on 09/14/2020) 12 tablet 0  . meclizine (ANTIVERT) 25 MG tablet Take 25 mg by mouth 3 (three) times daily as needed.      Home: Home Living Family/patient expects to be discharged to:: Private residence Living Arrangements: Alone Available Help at Discharge: Family, Neighbor Type of Home: Apartment Home Access: Stairs to enter, Media planner Home Layout: One level Bathroom Shower/Tub: Chiropodist: Standard Home Equipment: [employer] - single point, Grab bars - toilet, Grab bars - tub/shower, Hand held shower head Additional Comments: no animals  Functional History: Prior Function Level of Independence: Independent with assistive device(s) Comments: transportation via a neighbor Functional Status:  Mobility: Bed Mobility Overal bed mobility: Modified Independent Bed Mobility: Supine to Sit, Sit to Supine, Rolling Rolling: Modified independent (Device/Increase time) Supine to sit: Modified independent (Device/Increase time), HOB elevated Sit to supine: Modified independent (Device/Increase time), HOB elevated General bed mobility comments: Extra time, but able to come to transition supine <> sit EOB with HOB elevated safely. Rolling with utilization of bed rails. Transfers Overall transfer level: Needs assistance Equipment used: Straight cane Transfers: Sit to/from Stand, Stand Pivot Transfers Sit to  Stand: Min assist Stand pivot transfers: Min assist General transfer comment: STS 1x and stand step transfers 2x with slightly increased time to power up to stand, but no overt LOB. MinA for safety as unsteadiness was noted. Ambulation/Gait Ambulation/Gait assistance: Min assist Gait Distance (Feet): 50 Feet Assistive device: Straight cane Gait Pattern/deviations: Step-through pattern, Decreased stride length General Gait Details: SPC in R hand, displaying appropriate use of cane. Displayed decreased stride length and moderate trunk sway which increased as distance progressed and pt became SOB but SpO2 remained >/= 96%. Pt noted feeling weak and requested to sit down. MinA to maintain balance, esp when cued to rotate head during gait. Gait velocity: decreased Gait velocity interpretation: <1.8 ft/sec, indicate of risk for recurrent falls    ADL: ADL Overall ADL's : Needs assistance/impaired Grooming: Wash/dry face, Wash/dry hands, Oral care, Standing, Min guard Upper Body Bathing: Min guard Lower Body Dressing: Supervision/safety, Bed level Lower Body Dressing  Details (indicate cue type and reason): able to don socks long sitting Toilet Transfer: Min guard, RW Functional mobility during ADLs: Min guard, Rolling walker General ADL Comments: pt asking when she can progress past RW use but also reports unsteady at this time time without it  Cognition: Cognition Overall Cognitive Status: Within Functional Limits for tasks assessed Orientation Level: Oriented X4 Cognition Arousal/Alertness: Awake/alert Behavior During Therapy: WFL for tasks assessed/performed Overall Cognitive Status: Within Functional Limits for tasks assessed  Blood pressure 115/86, pulse 64, temperature 97.6 F (36.4 C), temperature source Oral, resp. rate 20, height 5\' 4"  (1.626 m), weight 57.8 kg, last menstrual period 08/10/2020, SpO2 100 %. Physical Exam Vitals reviewed.  Constitutional:      General: She is  not in acute distress.    Appearance: She is not ill-appearing.  HENT:     Head: Normocephalic and atraumatic.     Right Ear: External ear normal.     Left Ear: External ear normal.     Nose: Nose normal.  Eyes:     General:        Right eye: No discharge.        Left eye: No discharge.     Extraocular Movements: Extraocular movements intact.  Cardiovascular:     Rate and Rhythm: Normal rate and regular rhythm.  Pulmonary:     Effort: Pulmonary effort is normal. No respiratory distress.     Breath sounds: No stridor.  Abdominal:     General: Abdomen is flat. Bowel sounds are normal. There is no distension.  Musculoskeletal:     Cervical back: Normal range of motion and neck supple.     Comments: Left lower extremity edema.  No tenderness.  Skin:    General: Skin is warm and dry.     Comments: Lesions to bilateral lower extremities.  Neurological:     Mental Status: She is alert and oriented to person, place, and time.     Comments: Alert Follows commands. Motor: Bilateral upper extremities: 4/5 proximal distal Bilateral lower extremities: 4/5 proximal distal  Psychiatric:        Mood and Affect: Mood normal.        Behavior: Behavior normal.        Thought Content: Thought content normal.     No results found for this or any previous visit (from the past 24 hour(s)). ECHO TEE  Result Date: 09/20/2020    TRANSESOPHOGEAL ECHO REPORT   Patient Name:   JERMYA DOWDING Date of Exam: 09/20/2020 Medical Rec #:  [id number]          Height:       64.0 in Accession #:    [id number]         Weight:       127.4 lb Date of Birth:  May 10, 1976           BSA:          1.615 m Patient Age:    2 years           BP:           180/95 mmHg Patient Gender: F                  HR:           107 bpm. Exam Location:  Inpatient Procedure: Transesophageal Echo, 3D Echo, Color Doppler, Cardiac Doppler and            Saline Contrast Bubble Study Indications:  Stroke  History:         Patient has prior  history of Echocardiogram examinations, most                  recent 09/18/2020. Risk Factors:Hypertension, Dyslipidemia and                  Lupus.  Sonographer:     Raquel Sarna Senior RDCS Referring Phys:  [place] Diagnosing Phys: Mertie Moores MD PROCEDURE: After discussion of the risks and benefits of a TEE, an informed consent was obtained from the patient. The transesophogeal probe was passed without difficulty through the esophogus of the patient. Local oropharyngeal anesthetic was provided with Cetacaine. Sedation performed by different physician. The patient was monitored while under deep sedation. Anesthestetic sedation was provided intravenously by Anesthesiology: 161mg  of Propofol, 40mg  of Lidocaine. The patient developed no complications during the procedure. IMPRESSIONS  1. Left ventricular ejection fraction, by estimation, is 55 to 60%. The left ventricle has normal function.  2. Right ventricular systolic function is normal. The right ventricular size is normal.  3. No left atrial/left atrial appendage thrombus was detected.  4. The mean MS gradient is 14 mmhg.     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 This could be the cause of her stroke.     3-D images of the mitral valve were obtained. . The mitral valve is abnormal. Moderate to severe mitral valve regurgitation. Moderate to severe mitral stenosis. Moderate to severe mitral annular calcification.  5. Tricuspid valve regurgitation is moderate to severe.  6. The aortic valve is grossly normal. Aortic valve regurgitation is mild to moderate. FINDINGS  Left Ventricle: Left ventricular ejection fraction, by estimation, is 55 to 60%. The left ventricle has normal function. The left ventricular internal cavity size was normal in size. Right Ventricle: The right ventricular size is normal. No increase in right ventricular wall thickness. Right  ventricular systolic function is normal. Left Atrium: Left atrial size was not well visualized. No left atrial/left atrial appendage thrombus was detected. Right Atrium: Right atrial size was not well visualized. Pericardium: There is no evidence of pericardial effusion. Mitral Valve: The mean MS gradient is 14 mmhg. 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 This could be the cause of her stroke. 3-D images of the mitral valve were obtained. The mitral valve is abnormal. There is severe thickening of the mitral valve leaflet(s). There is severe calcification of the mitral valve leaflet(s). Moderate to severe mitral annular calcification. Moderate  to severe mitral valve regurgitation. Moderate to severe mitral valve stenosis. MV peak gradient, 22.1 mmHg. The mean mitral valve gradient is 14.0 mmHg. Pulmonary venous flow is normal. Tricuspid Valve: The tricuspid valve is grossly normal. Tricuspid valve regurgitation is moderate to severe. Aortic Valve: The aortic valve is grossly normal. Aortic valve regurgitation is mild to moderate. Pulmonic Valve: The pulmonic valve was grossly normal. Pulmonic valve regurgitation is not visualized. Aorta: The aortic root and ascending aorta are structurally normal, with no evidence of dilitation. IAS/Shunts: No atrial level shunt detected by color flow Doppler. Agitated saline contrast was given intravenously to evaluate for intracardiac shunting. With bubble contrast, there were bubbles that appeared in the LA aproximately 8-9 cardiac cycles after injection. These are most likely due to a transpulmonary AVM or  shunt. This is not c/w an ASD or PFO.  MITRAL VALVE MV Peak grad: 22.1 mmHg MV Mean grad: 14.0 mmHg MV Vmax:      2.35 m/s MV Vmean:     178.0 cm/s Mertie Moores MD Electronically signed by Mertie Moores MD Signature Date/Time: 09/20/2020/5:51:40 PM    Final      Assessment/Plan: Diagnosis: acute infarct involving the midline inferior vermis.   Labs independently reviewed.  Records reviewed and summated above.  1. Does the need for close, 24 hr/day medical supervision in concert with the patient's rehab needs make it unreasonable for this patient to be served in a less intensive setting? Yes  2. 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 Due to safety, disease management and patient education, does the patient require 24 hr/day rehab nursing? Yes 4. Does the patient require coordinated care of a physician, rehab nurse, therapy disciplines of PT/OT to address physical and functional deficits in the context of the above medical diagnosis(es)? Yes Addressing deficits in the following areas: balance, endurance, locomotion, strength, transferring, bathing, dressing, toileting and psychosocial support 5. Can the patient actively participate in an intensive therapy program of at least 3 hrs of therapy per day at least 5 days per week? Yes 6. The potential for patient to make measurable gains while on inpatient rehab is excellent 7. Anticipated functional outcomes upon discharge from inpatient rehab are modified independent and supervision  with PT, modified independent and supervision with OT, n/a with SLP. 8. Estimated rehab length of stay to reach the above functional goals is: 7-11 days. 9. Anticipated discharge destination: Home 10. Overall Rehab/Functional Prognosis:  good  RECOMMENDATIONS: This patient's condition is appropriate for continued rehabilitative care in the following setting: CIR Patient has agreed to participate in recommended program. Yes Note that insurance prior authorization may be required for reimbursement for recommended care.  Comment: Rehab Admissions Coordinator to follow up.  I have personally performed a face to face diagnostic evaluation, including, but not limited to relevant history and physical exam findings, of this patient and developed relevant assessment and plan.  Additionally, I have reviewed and concur with the physician assistant's documentation above.   Delice Lesch, MD, ABPMR Lavon Paganini Angiulli, PA-C 09/21/2020

## 2020-09-20 NOTE — Anesthesia Postprocedure Evaluation (Signed)
Anesthesia Post Note  Patient: [name]  Procedure(s) Performed: TRANSESOPHAGEAL ECHOCARDIOGRAM (TEE) (N/A ) BUBBLE STUDY     Patient location during evaluation: PACU Anesthesia Type: MAC Level of consciousness: awake and alert Pain management: pain level controlled Vital Signs Assessment: post-procedure vital signs reviewed and stable Respiratory status: spontaneous breathing and respiratory function stable Cardiovascular status: stable Postop Assessment: no apparent nausea or vomiting Anesthetic complications: no   No complications documented.  Last Vitals:  Vitals:   09/20/20 1240 09/20/20 1243  BP:  (!) 176/101  Pulse: 98 100  Resp: 13 14  Temp:    SpO2: 100% 100%    Last Pain:  Vitals:   09/20/20 1240  TempSrc:   PainSc: 0-No pain                 [name]

## 2020-09-20 NOTE — CV Procedure (Signed)
    Transesophageal Echocardiogram Note  [name] [id number] 1975/12/06  Procedure: Transesophageal Echocardiogram Indications: TIA   Procedure Details Consent: Obtained Time Out: Verified patient identification, verified procedure, site/side was marked, verified correct patient position, special equipment/implants available, Radiology Safety Procedures followed,  medications/allergies/relevent history reviewed, required imaging and test results available.  Performed  Medications:  During this procedure the patient is administered Lidocaine 40 mg IV followed by Propofol drip by Earnest Bailey, CRNA.   A total of Propofol 147 mg was used for the procedure  sedation.  The patient's heart rate, blood pressure, and oxygen saturation are monitored continuously during the procedure. The period of conscious sedation is  30  minutes, of which I was present face-to-face 100% of this time.  Left Ventrical:  Normal LV function   Mitral Valve: markedly calcified MV.   Severe mitral stenosis.  Mean MS gradient of 16 mmHg.   Moderate MR .   There is no reversal of flow in the pulmonary veins.   Aortic Valve:  Mild - mod AI.  No AS   Tricuspid Valve: severe TR   Pulmonic Valve: grossly normal   Left Atrium/ Left atrial appendage: seen well.  No LAA thrombi   Atrial septum: no evidence of ASD or PFO.   During the bubble study, there were bubbles that appeared in the LA after 8-9 cardiac cycles.   This is more c/w a transpulmonary shunt or AVM  Aorta: normal   Complications: No apparent complications Patient did tolerate procedure well.   Thayer Headings, Brooke Bonito., MD, [facility] 09/20/2020, 12:19 PM

## 2020-09-20 NOTE — Progress Notes (Addendum)
Progress Note  Patient Name: [name] Date of Encounter: 09/20/2020  Primary Cardiologist: Kate Sable, MD (Inactive)   Subjective   Patient resting comfortably in bed after TEE. She denies any further dizziness. She denies any chest pain, shortness of breath, abdominal bloating, or lower extremity edema.  Inpatient Medications    Scheduled Meds: . [MAR Hold] aspirin EC  81 mg Oral Daily  . [MAR Hold] atorvastatin  40 mg Oral Daily  . [MAR Hold] calcium acetate (Phos Binder)  1,334 mg Oral TID WC  . [MAR Hold] Chlorhexidine Gluconate Cloth  6 each Topical Daily  . [MAR Hold] diltiazem  240 mg Oral QHS  . [MAR Hold] metoprolol tartrate  25 mg Oral BID  . [MAR Hold] pantoprazole  40 mg Oral Daily  . [MAR Hold] PARoxetine  40 mg Oral Daily  . [MAR Hold] predniSONE  10 mg Oral Q breakfast  . [MAR Hold] traZODone  100 mg Oral QHS   Continuous Infusions: . sodium chloride Stopped (09/20/20 1228)   PRN Meds: [MAR Hold] acetaminophen **OR** [MAR Hold] acetaminophen, [MAR Hold] polyvinyl alcohol   Vital Signs    Vitals:   09/20/20 1223 09/20/20 1234 09/20/20 1240 09/20/20 1243  BP: (!) 161/85 (!) 192/89  (!) 176/101  Pulse: (!) 106  98 100  Resp: (!) 21  13 14   Temp: (!) 97.1 F (36.2 C)     TempSrc: Temporal     SpO2: 100%  100% 100%  Weight:      Height:        Intake/Output Summary (Last 24 hours) at 09/20/2020 1302 Last data filed at 09/20/2020 1242 Gross per 24 hour  Intake 120 ml  Output 1000 ml  Net -880 ml   Filed Weights   09/20/20 0700 09/20/20 1013 09/20/20 1113  Weight: 58.8 kg 57.8 kg 57.8 kg    Telemetry    Patient's telemetry was personally reviewed and showed to evidence of atrial tachyarrhythmias - Personally Reviewed  ECG    EKG was not performed today.  Physical Exam   GEN: No acute distress.   Neck: No JVD Cardiac: RRR, 2/6 holosystolic murmur at the apex, and a 2/6 low pitch diastolic murmur at the left lower sternal  boarder Respiratory: Clear to auscultation bilaterally. GI: Soft, nontender, non-distended  MS: No edema; No deformity. Neuro:  Nonfocal neurologic deficits  Psych: Normal affect   Labs    Chemistry Recent Labs  Lab 09/17/20 1303 09/17/20 1311 09/18/20 0201 09/19/20 0242 09/20/20 0511  NA 136   < > 135 134* 133*  K 5.3*   < > 3.2* 4.3 5.2*  CL 98   < > 96* 97* 96*  CO2 24   < > 27 25 22   GLUCOSE 170*   < > 135* 174* 156*  BUN 56*   < > 18 46* 71*  CREATININE 8.21*   < > 4.24* 6.85* 9.01*  CALCIUM 8.9   < > 8.3* 8.1* 8.0*  PROT 5.7*  --   --   --  5.3*  ALBUMIN 3.1*   < > 3.0* 2.6* 2.7*  AST 78*  --   --   --  16  ALT 105*  --  84*  --  40  ALKPHOS 123  --   --   --  100  BILITOT 0.9  --   --   --  0.5  GFRNONAA 6*   < > 13* 7* 5*  ANIONGAP 14   < >  12 12 15    < > = values in this interval not displayed.     Hematology Recent Labs  Lab 09/18/20 0201 09/19/20 0242 09/20/20 0511  WBC 9.4 10.9* 10.3  RBC 3.95 3.67* 3.63*  HGB 11.3* 10.4* 10.3*  HCT 35.7* 33.6* 32.7*  MCV 90.4 91.6 90.1  MCH 28.6 28.3 28.4  MCHC 31.7 31.0 31.5  RDW 19.3* 19.3* 18.8*  PLT 75* 74* 75*    Cardiac EnzymesNo results for input(s): TROPONINI in the last 168 hours. No results for input(s): TROPIPOC in the last 168 hours.   BNPNo results for input(s): BNP, PROBNP in the last 168 hours.   DDimer No results for input(s): DDIMER in the last 168 hours.   Radiology    No results found.  Cardiac Studies   Transesophageal Echocardiogram: Left Ventrical:  Normal LV function   Mitral Valve: markedly calcified MV.   Severe mitral stenosis.  Mean MS gradient of 16 mmHg.   Moderate MR .   There is no reversal of flow in the pulmonary veins.   Aortic Valve:  Mild - mod AI.  No AS   Tricuspid Valve: severe TR   Pulmonic Valve: grossly normal   Left Atrium/ Left atrial appendage: seen well.  No LAA thrombi   Atrial septum: no evidence of ASD or PFO.   During the bubble study,  there were bubbles that appeared in the LA after 8-9 cardiac cycles.   This is more c/w a transpulmonary shunt or AVM  Aorta: normal   Patient Profile     44 y.o. female [name] is a 44 y.o. female with a hx of ESRD (TTS), HTN, HLD, SLE who presented to [facility] for further evaluation and management of acute cerebellar infarct complicated by severe mitral stenosis and is now s/p TEE.   Assessment & Plan    1. Mitral Stenosis - Patient is s/p TEE to better evaluate her severe mitral stenosis. TEE was significant for normal LV function, severe tricuspid regurgitation, severe mitral stenosis (mean gradient of 16 mm Hg). There was no evidence of mitral vegetations or LAA thrombi. Bubble study concerning for possible transpulmonary shunt or AVM. Patient has not had any evidence of atrial tachyarrhythmias since admission. She is tolerating metoprolol and diltiazem well without hypotension. Patient will need an cardiac event monitor at discharge.  - Continue diltiazem 240 mg daily - Continue Metoprolol 25 mg BID.   2. HTN - Patient tolerating Diltiazem and metoprolol well without hypotension. She remained hypertensive and my benefit form restarting hydralazine.  - Restart Hydralazine if patient remain hypertensive today.  - Continue Diltiazem and Metoprolol   3. Acute Cerebellar Vermis Infarct - Patient continue to make good progress. She denies any dizziness today. TEE showed no evidence of LAA thrombi or valvular vegetations. Continue to to defer management to neurology and primary team. .  4.  SLE - patient has a history of lupus diagnosed in 2005. She takes prednisone 10 mg daily.  Will defer management of lupus to primary team.  5. 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  Defer further treatment to nephrology team.  [facility] will sign off.   Medication Recommendations:  Continue metoprolol 35 mg bid and diltiazem 240 mg daily  Other  recommendations (labs, testing, etc):  Cardiac event monitor at discharge Follow up as an outpatient:  Will arrange outpatient follow up.   For questions or updates, please contact [place] Please  consult [email or web address] for contact info under Cardiology/STEMI.     Signed, Marianna Payment, MD  09/20/2020, 1:02 PM    Patient seen, examined. Available data reviewed. Agree with findings, assessment, and plan as outlined by Marianna Payment, MD. the patient is independently interviewed and examined.  She is doing well after her TEE this afternoon.  She denies chest discomfort or shortness of breath.  Her exam is unchanged from yesterday.  JVP is normal, lungs are clear, heart is regular rate and rhythm with a 2/6 systolic murmur at the right upper sternal border and a 2/6 low pitched diastolic murmur at the left lower sternal border, abdomen is soft and nontender, extremities have no edema.  TEE images were personally reviewed.  She has dense calcification of the posterior mitral annulus with calcified echo density noted in that area.  There is no evidence of mobile vegetation.  There is moderate mitral regurgitation present.  LV function is normal.  There is no left atrial appendage thrombus or smoke.  Will increase the patient's beta-blockade to slow her heart rate in the setting of significant mitral stenosis.  I have asked our electrophysiology team to see her and consider an implantable loop recorder to evaluate for atrial fibrillation.  I discussed this with neurology who agrees.  I discussed the idea of a wearable monitor versus implantable monitor with the patient and she is open to the idea of an implantable loop monitor.  Sherren Mocha, M.D. 09/20/2020 3:56 PM

## 2020-09-20 NOTE — Progress Notes (Signed)
FPTS Interim Progress Note  S: She states she has had two episodes of bowel incontinence (11/1 and 10/31), watery diarrhea both times. She states she cannot feel it coming and she is concerned about this as it has never happened before 10/29 when she had the CVA. In addition to these episodes, she has had a BM both days that was a thicker consistency which she could feel coming and could control when they came. She can feel when she wipes.  O: BP (!) 176/101   Pulse 100   Temp (!) 97.1 F (36.2 C) (Temporal)   Resp 14   Ht 5\' 4"  (1.626 m)   Wt 57.8 kg   LMP 08/10/2020   SpO2 100%   BMI 21.87 kg/m     A/P: As pt can feel when she wipes, and she has had BMs that she can feel coming and can control, a rectal tone exam will be deferred at this time.    Micheline Rough, Medical Student 09/20/2020, 1:49 PM  [facility], [facility] pager [phone]

## 2020-09-20 NOTE — Progress Notes (Addendum)
[facility] Progress Note   Subjective:  Patient feels well today.  Feels like her symptoms continue to get better.  Plan for TEE today.  Severe mitral stenosis on TTE.  She was seen on dialysis and tolerating it well.  Objective Vitals:   09/20/20 0930 09/20/20 1000 09/20/20 1013 09/20/20 1020  BP: (!) 153/90 (!) 168/90 (!) 162/86 (!) 166/82  Pulse: 93 94 92 94  Resp: 15 15 13    Temp:    98.1 F (36.7 C)  TempSrc:    Oral  SpO2:   99%   Weight:   57.8 kg   Height:         Additional Objective Labs: Basic Metabolic Panel: Recent Labs  Lab 09/18/20 0201 09/19/20 0242 09/20/20 0511  NA 135 134* 133*  K 3.2* 4.3 5.2*  CL 96* 97* 96*  CO2 27 25 22   GLUCOSE 135* 174* 156*  BUN 18 46* 71*  CREATININE 4.24* 6.85* 9.01*  CALCIUM 8.3* 8.1* 8.0*  PHOS 3.5 6.3*  --    CBC: Recent Labs  Lab 09/17/20 1303 09/17/20 1311 09/18/20 0201 09/19/20 0242 09/20/20 0511  WBC 12.1*   < > 9.4 10.9* 10.3  NEUTROABS 9.9*  --   --   --   --   HGB 10.9*   < > 11.3* 10.4* 10.3*  HCT 35.1*   < > 35.7* 33.6* 32.7*  MCV 91.9  --  90.4 91.6 90.1  PLT 78*   < > 75* 74* 75*   < > = values in this interval not displayed.   Blood Culture    Component Value Date/Time   SDES BLOOD RIGHT FOREARM UPPER 03/15/2020 0408   SPECREQUEST  03/15/2020 0408    BOTTLES DRAWN AEROBIC AND ANAEROBIC Blood Culture adequate volume   CULT  03/15/2020 0408    NO GROWTH 5 DAYS Performed at [facility], [place] [address], [place],  [id number]    REPTSTATUS 03/20/2020 FINAL 03/15/2020 0408     Physical Exam General: lying in bed no distress Heart: normal rate Lungs: bilateral chest rise, no iwob Abdomen: Soft non-tender  Extremities: trace edema in ble Dialysis Access: R IJ TDC dsg c,d,i; R AVF +bruit   Medications: . sodium chloride     . aspirin EC  81 mg Oral Daily  . atorvastatin  40 mg Oral Daily  . calcium acetate (Phos Binder)  1,334 mg Oral TID WC  .  Chlorhexidine Gluconate Cloth  6 each Topical Daily  . diltiazem  240 mg Oral QHS  . metoprolol tartrate  25 mg Oral BID  . pantoprazole  40 mg Oral Daily  . PARoxetine  40 mg Oral Daily  . predniSONE  10 mg Oral Q breakfast  . traZODone  100 mg Oral QHS    Dialysis Orders:  [facility] TTS 3h 400/600 2K/2.5Ca EDW 58kg TDC Hep bolus 1800 EPO 6600 q HD Venofer 50 q Mon  Assessment/Plan: 1. Dizziness/Ataxia. Stroke w/u in progress. MRI showing subcentimeter acute infarct involving the midline inferior vermis. Chronic left parietal infarct -per neuro/primary team. On antiplatelet therapy. Undergoing TEE to rule out embolic source. 2. ESRD -  HD TTS via TDC. (Had plans for OP fistulogram this week)  Continue on schedule.  3. Hypertension/volume  - BP borderline on admission. No meds here. Probably do not want to drop BP too acutely given clinical situation 4. Anemia  - Hgb acceptable. No ESA needs currently  5. Metabolic bone disease -  Ca ok. Continue home binders (phoslyra). No VDRA.  6.  SLE - anti-dsdna norma, Ro and La high 7. Mitral stenosis: Found on TTE.  TEE today to better assess.  Wonder if lupus causing Libman-Sacks endocarditis could be a contributing factor.

## 2020-09-20 NOTE — Anesthesia Procedure Notes (Signed)
Procedure Name: MAC Date/Time: 09/20/2020 12:07 PM Performed by: Lavell Luster, CRNA Pre-anesthesia Checklist: Patient identified, Emergency Drugs available, Suction available, Patient being monitored and Timeout performed Patient Re-evaluated:Patient Re-evaluated prior to induction Oxygen Delivery Method: Nasal cannula Preoxygenation: Pre-oxygenation with 100% oxygen Induction Type: IV induction Placement Confirmation: breath sounds checked- equal and bilateral Dental Injury: Teeth and Oropharynx as per pre-operative assessment

## 2020-09-20 NOTE — Progress Notes (Signed)
Inpatient Rehab Admissions Coordinator Note:   Per therapy recommendations, pt was screened for CIR candidacy by Shann Medal, PT, DPT.  At this time we are recommending a CIR consult and I will request an order per our protocol.  Please contact me with questions.   Shann Medal, PT, DPT [phone] 09/20/20 1:45 PM

## 2020-09-20 NOTE — Progress Notes (Addendum)
STROKE TEAM PROGRESS NOTE   INTERVAL HISTORY No family at bedside.  Patient sitting in bed, not in distress.  Had a TEE today showed no LAA thrombus.  However, confirmed mitral valve severe stenosis with calcified valve.  Telemetry monitoring showed no A. fib.  Would consider long-term cardiac monitoring to rule out A. fib.  Dr. Burt Knack discussed with patient, who prefers loop recorder.   OBJECTIVE Vitals:   09/20/20 1223 09/20/20 1234 09/20/20 1240 09/20/20 1243  BP: (!) 161/85 (!) 192/89  (!) 176/101  Pulse: (!) 106  98 100  Resp: (!) _0 Temp: (!) 97.1 F (36.2 C)     TempSrc: Temporal     SpO2: 100%  100% 100%  Weight:      Height:       CBC:  Recent Labs  Lab 09/17/20 1303 09/17/20 1311 09/19/20 0242 09/20/20 0511  WBC 12.1*   < > 10.9* 10.3  NEUTROABS 9.9*  --   --   --   HGB 10.9*   < > 10.4* 10.3*  HCT 35.1*   < > 33.6* 32.7*  MCV 91.9   < > 91.6 90.1  PLT 78*   < > 74* 75*   < > = values in this interval not displayed.   Basic Metabolic Panel:  Recent Labs  Lab 09/18/20 0201 09/18/20 0201 09/19/20 0242 09/20/20 0511  NA 135   < > 134* 133*  K 3.2*   < > 4.3 5.2*  CL 96*   < > 97* 96*  CO2 27   < > 25 22  GLUCOSE 135*   < > 174* 156*  BUN 18   < > 46* 71*  CREATININE 4.24*   < > 6.85* 9.01*  CALCIUM 8.3*   < > 8.1* 8.0*  PHOS 3.5  --  6.3*  --    < > = values in this interval not displayed.   Lipid Panel:     Component Value Date/Time   CHOL 209 (H) 09/18/2020 0201   TRIG 135 09/18/2020 0201   HDL 77 09/18/2020 0201   CHOLHDL 2.7 09/18/2020 0201   VLDL 27 09/18/2020 0201   LDLCALC 105 (H) 09/18/2020 0201   HgbA1c:  Lab Results  Component Value Date   HGBA1C 7.2 (H) 09/18/2020   Urine Drug Screen: No results found for: LABOPIA, COCAINSCRNUR, LABBENZ, AMPHETMU, THCU, LABBARB  Alcohol Level     Component Value Date/Time   ETH <10 09/17/2020 1303    IMAGING  CT ANGIO HEAD W OR WO CONTRAST CT ANGIO NECK W OR WO  CONTRAST 09/18/2020 IMPRESSION:  Small acute midline cerebellar infarct as seen on MRI. No new intracranial abnormality. No hemodynamically significant stenosis in the neck. No proximal intracranial vessel occlusion. 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   MR BRAIN WO CONTRAST 09/17/2020 IMPRESSION:  Motion degraded. Subcentimeter acute infarct involving the midline inferior vermis. Chronic left parietal infarct.   VAS [place] LOWER EXTREMITY VENOUS (DVT) 09/18/2020 Summary:  BILATERAL: - No evidence of deep vein thrombosis seen in the lower extremities, bilaterally. -  Transthoracic Echocardiogram  1. Left ventricular ejection fraction, by estimation, is 55 to 60%. The left ventricle has normal function. The left ventricle has no regional wall motion abnormalities. There is severe concentric left ventricular hypertrophy. Diastolic function indeterminant due to moderate-to-severe MAC.  2. Right ventricular systolic function is mildly reduced. The right ventricular size is normal. There is moderately elevated pulmonary artery  systolic pressure.  3. Left atrial size was severely dilated.  4. Right atrial size was mildly dilated.  5. The mitral valve is degenerative. Moderate mitral valve regurgitation. Severe mitral stenosis with mean gradient 90mHg and MVA of 0.73cm2 by continuity equation at HR 88bpm. Moderate to severe mitral annular calcification.  6. Tricuspid valve regurgitation is severe.  7. The aortic valve is tricuspid. There is mild calcification of the aortic valve. There is mild thickening of the aortic valve. Aortic valve regurgitation is mild. Mild to moderate aortic valve sclerosis/calcification is present, without any evidence of aortic stenosis.  8. The inferior vena cava is normal in size with <50% respiratory variability, suggesting right atrial pressure of 8 mmHg.   TEE 09/20/2020  Left Ventrical:  Normal LV function  Mitral  Valve: markedly calcified MV.   Severe mitral stenosis.  Mean MS gradient of 16 mmHg.  Moderate MR .   There is no reversal of flow in the pulmonary veins.  Aortic Valve:  Mild - mod AI.  No AS  Tricuspid Valve: severe TR  Pulmonic Valve: grossly normal  Left Atrium/ Left atrial appendage: seen well.  No LAA thrombi  Atrial septum: no evidence of ASD or PFO.   During the bubble study, there were bubbles that appeared in the LA after 8-9 cardiac cycles.   This is more c/w a transpulmonary shunt or AVM Aorta: normal  ECG - SR rate 89 BPM. (See cardiology reading for complete details)   PHYSICAL EXAM   Temp:  [97.1 F (36.2 C)-98.3 F (36.8 C)] 97.1 F (36.2 C) (11/02 1223) Pulse Rate:  [92-106] 100 (11/02 1243) Resp:  [12-21] 14 (11/02 1243) BP: (145-192)/(82-103) 176/101 (11/02 1243) SpO2:  [98 %-100 %] 100 % (11/02 1243) Weight:  [57.8 kg-60.1 kg] 57.8 kg (11/02 1113)  General - Well nourished, well developed, in no apparent distress.  Ophthalmologic - fundi not visualized due to noncooperation.  Cardiovascular - Regular rhythm and rate.  Mental Status -  Level of arousal and orientation to time, place, and person were intact. Language including expression, naming, repetition, comprehension was assessed and found intact. Fund of Knowledge was assessed and was intact.  Cranial Nerves II - XII - II - Visual field intact OU. III, IV, VI - Extraocular movements intact. V - Facial sensation intact bilaterally. VII - Facial movement intact bilaterally. VIII - Hearing & vestibular intact bilaterally. X - Palate elevates symmetrically. XI - Chin turning & shoulder shrug intact bilaterally. XII - Tongue protrusion intact.  Motor Strength - The patient's strength was normal in all extremities and pronator drift was absent.  Bulk was normal and fasciculations were absent.   Motor Tone - Muscle tone was assessed at the neck and appendages and was normal.  Reflexes - The patient's  reflexes were symmetrical in all extremities and she had no pathological reflexes.  Sensory - Light touch, temperature/pinprick were assessed and were symmetrical.    Coordination - The patient had grossly normal movements in the hands and feet with no ataxia or dysmetria.  Tremor was absent.  Gait and Station - deferred.   ASSESSMENT/PLAN Ms. SHANNI MILFORDis a 44y.o. female with history of lupus, ESRD (TThSa HD), HTN (controlled), secondary hyperparathyroidism, depression, chronic tremor, extremity edema and anxiety who presents today with a ~12 hour history of room-spinning dizziness accompanied by nausea, vomiting, and stool incontinence. Ms. TVialpandostates that she has experienced dizzy spells in the past but they were self-limiting and resolved  rather quickly. She did not receive IV t-PA due to late presentation (>4.5 hours from time of onset).   Stroke: acute cerebellar vermis small infarct.  Given history of lupus, chronic left parietal infarct, concerning for embolic stroke this time - source unknown.   MRI head - Subcentimeter acute infarct involving the midline inferior vermis. Chronic left parietal infarct.   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   2D Echo - EF 55 to 60%, severe left atrial dilatation, severe mitral stenosis  LE venous Doppler negative for DVT  TEE no endocarditis or thrombus, severe MS, TR, no PFO or ASD, late bubbles c/w pulm shunt  Agree with Dr. Burt Knack to consider loop recorder to rule out afib  Lacey Jensen Virus 2  - negative  LDL - 105  HgbA1c - 7.2  VTE prophylaxis - SCDs  No antithrombotic prior to admission, increas aspirin 81 mg daily to ASA 325 mg daily, avoid DAPT now given thrombocytopenia.   Therapy recommendations:  CIR  Disposition:  Pending  Lupus  Patient has been following with rheumatology  On prednisone 10 mg daily  ESRD on HD due to lupus nephritis  Autoimmune labs  pending (SSA & SSB + >8.0, ESR 23, CRP 4.2, homo 18.9)  TEE no Libman-Sacks endocarditis  Hypercarbia work-up pending to rule out antiphospholipid syndrome  Mitral stenosis  TTE showed left atrium severely dilated with severe mitral stenosis  Cardiology consulted  TEE severe MS  Agree with Dr. Burt Knack to consider loop recorder to rule out afib  Hypertension  Home BP meds: diltiazem ; hydralazine  Current BP meds: diltiazem, metoprolol  Stable . Long-term BP goal normotensive  Hyperlipidemia  Home Lipid lowering medication: none   LDL 105, goal < 70  Current lipid lowering medication: Lipitor 40 mg daily   transaminitis AST / ALT 78/105-> 16/40  Continue statin at discharge  Diabetes, new diagnosis  Home diabetic meds: none   HgbA1c 7.2, goal < 7.0  SSI  CBG monitoring  PCP follow-up  Thrombocytopenia  platelets - 78->75->74->75  Platelet stable, will increase aspirin 81 to 325  Avoid DAPT for now  Other Stroke Risk Factors   History of stroke, left parietal chronic infarct on imaging  Other Active Problems  Hyperkalemia 5.2  ESRD on HD (Tue - Thurs - Sat)   Anemia - Hgb - 10.3 (chronic disease)  Mild Leukocytosis - WBCs - 10.3 - prednisone - afebrile  - resolved  Anxiety - on paroxetine  Insomnia - on trazodone  GERD - on PPI  Hospital day # 3   Rosalin Hawking, MD PhD Stroke Neurology 09/20/2020 4:27 PM    To contact Stroke Continuity provider, please refer to [email or web address] After hours, contact General Neurology

## 2020-09-20 NOTE — Progress Notes (Signed)
Echocardiogram Echocardiogram Transesophageal has been performed.  Oneal Deputy Kamilah Correia 09/20/2020, 12:45 PM

## 2020-09-20 NOTE — Interval H&P Note (Signed)
History and Physical Interval Note:  09/20/2020 11:33 AM  [name]  has presented today for surgery, with the diagnosis of Stroke.  The various methods of treatment have been discussed with the patient and family. After consideration of risks, benefits and other options for treatment, the patient has consented to  Procedure(s): TRANSESOPHAGEAL ECHOCARDIOGRAM (TEE) (N/A) as a surgical intervention.  The patient's history has been reviewed, patient examined, no change in status, stable for surgery.  I have reviewed the patient's chart and labs.  Questions were answered to the patient's satisfaction.     Mertie Moores

## 2020-09-20 NOTE — Plan of Care (Signed)
°  Problem: Education: Goal: Knowledge of disease or condition will improve Outcome: Progressing Goal: Knowledge of secondary prevention will improve Outcome: Progressing Goal: Knowledge of patient specific risk factors addressed and post discharge goals established will improve Outcome: Progressing   Problem: Coping: Goal: Will verbalize positive feelings about self Outcome: Progressing Goal: Will identify appropriate support needs Outcome: Progressing   Problem: Spontaneous Subarachnoid Hemorrhage Tissue Perfusion: Goal: Complications of Spontaneous Subarachnoid Hemorrhage will be minimized Outcome: Progressing   Problem: Ischemic Stroke/TIA Tissue Perfusion: Goal: Complications of ischemic stroke/TIA will be minimized Outcome: Progressing

## 2020-09-20 NOTE — Anesthesia Preprocedure Evaluation (Addendum)
Anesthesia Evaluation  Patient identified by MRN, date of birth, ID band Patient awake    Reviewed: Allergy & Precautions, H&P , NPO status , Patient's Chart, lab work & pertinent test results  Airway Mallampati: II  TM Distance: >3 FB Neck ROM: full    Dental no notable dental hx. (+)    Pulmonary shortness of breath,    breath sounds clear to auscultation       Cardiovascular Exercise Tolerance: Poor hypertension,  Rhythm:regular Rate:Normal     Neuro/Psych  Headaches, PSYCHIATRIC DISORDERS Anxiety Depression CVA    GI/Hepatic PUD, GERD  ,  Endo/Other  LUPUS  Renal/GU ESRF and DialysisRenal disease     Musculoskeletal  (+) Arthritis ,   Abdominal Normal abdominal exam  (+)   Peds  Hematology  (+) anemia ,   Anesthesia Other Findings   Reproductive/Obstetrics                            Anesthesia Physical Anesthesia Plan  ASA: III  Anesthesia Plan: MAC   Post-op Pain Management:    Induction: Intravenous  PONV Risk Score and Plan: 2 and Propofol infusion and Treatment may vary due to age or medical condition  Airway Management Planned: Nasal Cannula  Additional Equipment:   Intra-op Plan:   Post-operative Plan:   Informed Consent: I have reviewed the patients History and Physical, chart, labs and discussed the procedure including the risks, benefits and alternatives for the proposed anesthesia with the patient or authorized representative who has indicated his/her understanding and acceptance.       Plan Discussed with: CRNA, Anesthesiologist and Surgeon  Anesthesia Plan Comments:         Anesthesia Quick Evaluation

## 2020-09-20 NOTE — Progress Notes (Signed)
Pt scheduled for outpatient fistulogram tomorrow by Dr. Carlis Abbott.  Discussed with Dr. Joylene Grapes and will reschedule this for a couple of weeks given her current medical issues.  I have talked with our scheduler [facility] in the office.    Leontine Locket, [facility] 09/20/2020 1:27 PM

## 2020-09-20 NOTE — Transfer of Care (Signed)
Immediate Anesthesia Transfer of Care Note  Patient: [name]  Procedure(s) Performed: TRANSESOPHAGEAL ECHOCARDIOGRAM (TEE) (N/A ) BUBBLE STUDY  Patient Location: Endoscopy Unit  Anesthesia Type:MAC  Level of Consciousness: awake, alert  and sedated  Airway & Oxygen Therapy: Patient connected to nasal cannula oxygen  Post-op Assessment: Post -op Vital signs reviewed and stable  Post vital signs: stable  Last Vitals:  Vitals Value Taken Time  BP    Temp    Pulse    Resp    SpO2      Last Pain:  Vitals:   09/20/20 1113  TempSrc: Temporal  PainSc: 0-No pain         Complications: No complications documented.

## 2020-09-20 NOTE — Progress Notes (Addendum)
Family Medicine Teaching Service Daily Progress Note Intern Pager: [phone]  Patient name: [name] Medical record number: [id number] Date of birth: 12-Nov-1976 Age: 44 y.o. Gender: female  Primary Care Provider: Leeanne Rio, MD Consultants: Neurology, nephrology, and cardiology  Code Status: Full  Pt Overview and Major Events to Date:  Admitted 10/30  Assessment and Plan: Mary Pestka Tottenis a 44 y.o.femalepresenting with dizziness accompanied with vomiting and one episode of diarrheal incontinence. 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 PMH is significant for lupus, ESRD, HTN, and HLD.  Acute Cerebellar CVA This morning pt still a little dizzy, but improving significantly. No FND on exam.  - Neurologyconsulted, appreciate recommendations - PT/OT recs for CIR  - Aspirin 81 mg, statin 40 mg  -f/u TEE today to r/o Libman Sacks endocarditis   HTN  BP today 152/90. Denies chest pain  - Cards recs: d/c hydralazine and start metoprolol 25 mg BID - continue home diltiazem   Mitral stenosis, LA dilation Cardiology recommends OP cardiac monitor to r/o arrhythmias  - Cardiology consulted, appreciate recommendations - TEE today   Lupus, stable Follows with Dr. Elpidio Galea. States herLupusisin remission. 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 Patient  - Nephrologyconsulted appreciate recommendations - HD and medications per nephrology - Renal diet   Thrombocytopenia Likely due to lupus. No active bleeding. Platelets stable.  -Avoid DAPT, only on Aspirin 81 mg  HLD LDL 105 on 10/31, goal <70 -Continue atorvastatin  Diabetes  Not treated at home at this time. Hgb A1c 7.2.  -CBG monitoring - Outpatient f/u   Anxiety Home medications includeParoxetine 40 mg daily. -Continue home  medication  Insomnia Home medications includeTrazodone 100 mg nightly -Continue at bedtime  GERD Home medications includeProtonix 40 mg daily. - Continue home medication  FEN/GI: NPO PPx: SCDs  Disposition: Med-tele, Discharge to CIR per OT  Subjective:  Pt was seen in HD. States she is feeling better everyday and less dizzy with sitting.  The room is no longer spinning counter clockwise. She denies nausea, vomiting, chest pain, SOB.  Admits to a slight headache last night.  She states she has had two episodes of bowel incontinence (11/1 and 10/31), watery diarrhea both times. She states she cannot feel it coming and she is concerned about this as it has never happened before 10/29 when she had the CVA.   Objective: Temp:  [97.6 F (36.4 C)-98.3 F (36.8 C)] 98.1 F (36.7 C) (11/02 0700) Pulse Rate:  [92-100] 92 (11/02 0730) Resp:  [10-19] 12 (11/02 0730) BP: (145-166)/(89-107) 145/90 (11/02 0730) SpO2:  [98 %-100 %] 99 % (11/02 0700) Weight:  [58.8 kg-60.1 kg] 58.8 kg (11/02 0700) Physical Exam: General: alert, in no acute distress Cardiovascular: holosystolic murmur, normal S1 and S2, RRR Respiratory: CTA bilaterally Abdomen: non tender, non distended  Extremities: non-pitting edema of BLE, left worse than right.   Laboratory: Recent Labs  Lab 09/18/20 0201 09/19/20 0242 09/20/20 0511  WBC 9.4 10.9* 10.3  HGB 11.3* 10.4* 10.3*  HCT 35.7* 33.6* 32.7*  PLT 75* 74* 75*   Recent Labs  Lab 09/17/20 1303 09/17/20 1311 09/18/20 0201 09/19/20 0242 09/20/20 0511  NA 136   < > 135 134* 133*  K 5.3*   < > 3.2* 4.3 5.2*  CL 98   < > 96* 97* 96*  CO2 24   < >  27 25 22   BUN 56*   < > 18 46* 71*  CREATININE 8.21*   < > 4.24* 6.85* 9.01*  CALCIUM 8.9   < > 8.3* 8.1* 8.0*  PROT 5.7*  --   --   --  5.3*  BILITOT 0.9  --   --   --  0.5  ALKPHOS 123  --   --   --  100  ALT 105*  --  84*  --  40  AST 78*  --   --   --  16  GLUCOSE 170*   < > 135* 174* 156*   < >  = values in this interval not displayed.    Imaging/Diagnostic Tests: No results found.  Micheline Rough, Medical Student 09/20/2020, 8:04 AM OMS-IV AI, [place] Intern pager: [phone], text pages welcome

## 2020-09-21 ENCOUNTER — Ambulatory Visit (HOSPITAL_COMMUNITY): Admission: RE | Admit: 2020-09-21 | Payer: Medicare Other | Source: Home / Self Care | Admitting: Vascular Surgery

## 2020-09-21 ENCOUNTER — Encounter (HOSPITAL_COMMUNITY): Admission: RE | Payer: Self-pay | Source: Home / Self Care

## 2020-09-21 DIAGNOSIS — M329 Systemic lupus erythematosus, unspecified: Secondary | ICD-10-CM

## 2020-09-21 DIAGNOSIS — I10 Essential (primary) hypertension: Secondary | ICD-10-CM | POA: Diagnosis not present

## 2020-09-21 DIAGNOSIS — R42 Dizziness and giddiness: Secondary | ICD-10-CM | POA: Diagnosis not present

## 2020-09-21 DIAGNOSIS — Z992 Dependence on renal dialysis: Secondary | ICD-10-CM

## 2020-09-21 DIAGNOSIS — R739 Hyperglycemia, unspecified: Secondary | ICD-10-CM

## 2020-09-21 DIAGNOSIS — T380X5A Adverse effect of glucocorticoids and synthetic analogues, initial encounter: Secondary | ICD-10-CM

## 2020-09-21 DIAGNOSIS — I342 Nonrheumatic mitral (valve) stenosis: Secondary | ICD-10-CM | POA: Diagnosis not present

## 2020-09-21 DIAGNOSIS — I639 Cerebral infarction, unspecified: Secondary | ICD-10-CM | POA: Diagnosis not present

## 2020-09-21 DIAGNOSIS — D649 Anemia, unspecified: Secondary | ICD-10-CM

## 2020-09-21 DIAGNOSIS — N186 End stage renal disease: Secondary | ICD-10-CM

## 2020-09-21 DIAGNOSIS — F419 Anxiety disorder, unspecified: Secondary | ICD-10-CM

## 2020-09-21 DIAGNOSIS — F32A Depression, unspecified: Secondary | ICD-10-CM

## 2020-09-21 DIAGNOSIS — M8700 Idiopathic aseptic necrosis of unspecified bone: Secondary | ICD-10-CM

## 2020-09-21 LAB — RENAL FUNCTION PANEL
Albumin: 2.9 g/dL — ABNORMAL LOW (ref 3.5–5.0)
Anion gap: 16 — ABNORMAL HIGH (ref 5–15)
BUN: 50 mg/dL — ABNORMAL HIGH (ref 6–20)
CO2: 20 mmol/L — ABNORMAL LOW (ref 22–32)
Calcium: 8.1 mg/dL — ABNORMAL LOW (ref 8.9–10.3)
Chloride: 101 mmol/L (ref 98–111)
Creatinine, Ser: 6.64 mg/dL — ABNORMAL HIGH (ref 0.44–1.00)
GFR, Estimated: 7 mL/min — ABNORMAL LOW (ref >60)
Glucose, Bld: 188 mg/dL — ABNORMAL HIGH (ref 70–99)
Phosphorus: 6.6 mg/dL — ABNORMAL HIGH (ref 2.5–4.6)
Potassium: 5.2 mmol/L — ABNORMAL HIGH (ref 3.5–5.1)
Sodium: 137 mmol/L (ref 135–145)

## 2020-09-21 LAB — CBC
HCT: 38 % (ref 36.0–46.0)
Hemoglobin: 11.8 g/dL — ABNORMAL LOW (ref 12.0–15.0)
MCH: 28.3 pg (ref 26.0–34.0)
MCHC: 31.1 g/dL (ref 30.0–36.0)
MCV: 91.1 fL (ref 80.0–100.0)
Platelets: 106 10*3/uL — ABNORMAL LOW (ref 150–400)
RBC: 4.17 MIL/uL (ref 3.87–5.11)
RDW: 18.9 % — ABNORMAL HIGH (ref 11.5–15.5)
WBC: 11.9 10*3/uL — ABNORMAL HIGH (ref 4.0–10.5)
nRBC: 0 % (ref 0.0–0.2)

## 2020-09-21 LAB — LUPUS ANTICOAGULANT PANEL
DRVVT: 41.1 s (ref 0.0–47.0)
PTT Lupus Anticoagulant: 32.7 s (ref 0.0–51.9)

## 2020-09-21 SURGERY — A/V FISTULAGRAM
Anesthesia: LOCAL | Laterality: Right

## 2020-09-21 NOTE — Progress Notes (Signed)
Inpatient Rehab Admissions:  Inpatient Rehab Consult received.  I met with patient at the bedside for rehabilitation assessment and to discuss goals and expectations of an inpatient rehab admission.  We discussed supervision to mod I goals, with estimated length of stay to be about 7-10 days.  Pt has a niece that intermittently stays with her and can provide supervision, if needed, at discharge.  We discussed how therapy works with dialysis schedule and need for prior auth from insurance.  She is agreeable to CIR program and would like to begin insurance auth today, which I will do.  Expect to hear in answer in 24-48 hours regarding determination and will follow in the meantime for progress with therapy.   Signed: Shann Medal, PT, DPT Admissions Coordinator [phone] 09/21/20  3:38 PM

## 2020-09-21 NOTE — Progress Notes (Signed)
Progress Note  Patient Name: [name] Date of Encounter: 09/21/2020  [facility] Cardiologist: Kate Sable, MD (Inactive)   Subjective   Patient doing well.  No chest pain or shortness of breath today.  Inpatient Medications    Scheduled Meds: . aspirin EC  325 mg Oral Daily  . atorvastatin  40 mg Oral Daily  . calcium acetate (Phos Binder)  1,334 mg Oral TID WC  . Chlorhexidine Gluconate Cloth  6 each Topical Daily  . diltiazem  240 mg Oral QHS  . metoprolol tartrate  50 mg Oral BID  . pantoprazole  40 mg Oral Daily  . PARoxetine  40 mg Oral Daily  . predniSONE  10 mg Oral Q breakfast  . traZODone  100 mg Oral QHS   Continuous Infusions:  PRN Meds: acetaminophen **OR** acetaminophen, polyvinyl alcohol   Vital Signs    Vitals:   09/20/20 2204 09/20/20 2347 09/21/20 0337 09/21/20 0941  BP: (!) 156/95 (!) 130/99 113/68 115/86  Pulse: (!) 108 94 72 64  Resp: 17 10 16 20   Temp:  98 F (36.7 C) 97.6 F (36.4 C) 97.6 F (36.4 C)  TempSrc:  Oral Oral Oral  SpO2:  97% 100% 100%  Weight:      Height:        Intake/Output Summary (Last 24 hours) at 09/21/2020 1515 Last data filed at 09/21/2020 0942 Gross per 24 hour  Intake 118 ml  Output --  Net 118 ml   Last 3 Weights 09/20/2020 09/20/2020 09/20/2020  Weight (lbs) 127 lb 6.8 oz 127 lb 6.8 oz 129 lb 10.1 oz  Weight (kg) 57.8 kg 57.8 kg 58.8 kg      Telemetry    Normal sinus rhythm.  Heart rate is decreased from approximately 100 bpm down to 60 to 70 bpm over the past 24 hours - Personally Reviewed   Physical Exam  Alert, oriented, no distress GEN: No acute distress.   Neck: No JVD Cardiac: RRR, widely split S2, soft low pitched diastolic murmur and 2/6 systolic murmur at the left lower sternal border Respiratory: Clear to auscultation bilaterally. GI: Soft, nontender, non-distended  MS: No edema; No deformity. Neuro:  Nonfocal  Psych: Normal affect   Labs    High Sensitivity  Troponin:  No results for input(s): TROPONINIHS in the last 720 hours.    Chemistry Recent Labs  Lab 09/17/20 1303 09/17/20 1311 09/18/20 0201 09/19/20 0242 09/20/20 0511  NA 136   < > 135 134* 133*  K 5.3*   < > 3.2* 4.3 5.2*  CL 98   < > 96* 97* 96*  CO2 24   < > 27 25 22   GLUCOSE 170*   < > 135* 174* 156*  BUN 56*   < > 18 46* 71*  CREATININE 8.21*   < > 4.24* 6.85* 9.01*  CALCIUM 8.9   < > 8.3* 8.1* 8.0*  PROT 5.7*  --   --   --  5.3*  ALBUMIN 3.1*   < > 3.0* 2.6* 2.7*  AST 78*  --   --   --  16  ALT 105*  --  84*  --  40  ALKPHOS 123  --   --   --  100  BILITOT 0.9  --   --   --  0.5  GFRNONAA 6*   < > 13* 7* 5*  ANIONGAP 14   < > 12 12 15    < > =  values in this interval not displayed.     Hematology Recent Labs  Lab 09/18/20 0201 09/19/20 0242 09/20/20 0511  WBC 9.4 10.9* 10.3  RBC 3.95 3.67* 3.63*  HGB 11.3* 10.4* 10.3*  HCT 35.7* 33.6* 32.7*  MCV 90.4 91.6 90.1  MCH 28.6 28.3 28.4  MCHC 31.7 31.0 31.5  RDW 19.3* 19.3* 18.8*  PLT 75* 74* 75*    BNPNo results for input(s): BNP, PROBNP in the last 168 hours.   DDimer No results for input(s): DDIMER in the last 168 hours.   Radiology    ECHO TEE  Result Date: 09/20/2020    TRANSESOPHOGEAL ECHO REPORT   Patient Name:   [name] Date of Exam: 09/20/2020 Medical Rec #:  [id number]          Height:       64.0 in Accession #:    [id number]         Weight:       127.4 lb Date of Birth:  [birth year]           BSA:          1.615 m Patient Age:    44 years           BP:           180/95 mmHg Patient Gender: F                  HR:           107 bpm. Exam Location:  Inpatient Procedure: Transesophageal Echo, 3D Echo, Color Doppler, Cardiac Doppler and            Saline Contrast Bubble Study Indications:     Stroke  History:         Patient has prior history of Echocardiogram examinations, most                  recent 09/18/2020. Risk Factors:Hypertension, Dyslipidemia and                  Lupus.  Sonographer:      Raquel Sarna Senior RDCS Referring Phys:  [place] Diagnosing Phys: Mertie Moores MD PROCEDURE: After discussion of the risks and benefits of a TEE, an informed consent was obtained from the patient. The transesophogeal probe was passed without difficulty through the esophogus of the patient. Local oropharyngeal anesthetic was provided with Cetacaine. Sedation performed by different physician. The patient was monitored while under deep sedation. Anesthestetic sedation was provided intravenously by Anesthesiology: 161mg  of Propofol, 40mg  of Lidocaine. The patient developed no complications during the procedure. IMPRESSIONS  1. Left ventricular ejection fraction, by estimation, is 55 to 60%. The left ventricle has normal function.  2. Right ventricular systolic function is normal. The right ventricular size is normal.  3. No left atrial/left atrial appendage thrombus was detected.  4. The mean MS gradient is 14 mmhg.     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 This could be the cause of her stroke.     3-D images of the mitral valve were obtained. . The mitral valve is abnormal. Moderate to severe mitral valve regurgitation. Moderate to severe mitral stenosis. Moderate to severe mitral annular calcification.  5. Tricuspid valve regurgitation is moderate to severe.  6. The aortic valve is grossly normal. Aortic valve regurgitation is mild to moderate. FINDINGS  Left Ventricle: Left ventricular  ejection fraction, by estimation, is 55 to 60%. The left ventricle has normal function. The left ventricular internal cavity size was normal in size. Right Ventricle: The right ventricular size is normal. No increase in right ventricular wall thickness. Right ventricular systolic function is normal. Left Atrium: Left atrial size was not well visualized. No left atrial/left atrial appendage thrombus was detected. Right Atrium:  Right atrial size was not well visualized. Pericardium: There is no evidence of pericardial effusion. Mitral Valve: The mean MS gradient is 14 mmhg. 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 This could be the cause of her stroke. 3-D images of the mitral valve were obtained. The mitral valve is abnormal. There is severe thickening of the mitral valve leaflet(s). There is severe calcification of the mitral valve leaflet(s). Moderate to severe mitral annular calcification. Moderate  to severe mitral valve regurgitation. Moderate to severe mitral valve stenosis. MV peak gradient, 22.1 mmHg. The mean mitral valve gradient is 14.0 mmHg. Pulmonary venous flow is normal. Tricuspid Valve: The tricuspid valve is grossly normal. Tricuspid valve regurgitation is moderate to severe. Aortic Valve: The aortic valve is grossly normal. Aortic valve regurgitation is mild to moderate. Pulmonic Valve: The pulmonic valve was grossly normal. Pulmonic valve regurgitation is not visualized. Aorta: The aortic root and ascending aorta are structurally normal, with no evidence of dilitation. IAS/Shunts: No atrial level shunt detected by color flow Doppler. Agitated saline contrast was given intravenously to evaluate for intracardiac shunting. With bubble contrast, there were bubbles that appeared in the LA aproximately 8-9 cardiac cycles after injection. These are most likely due to a transpulmonary AVM or shunt. This is not c/w an ASD or PFO.  MITRAL VALVE MV Peak grad: 22.1 mmHg MV Mean grad: 14.0 mmHg MV Vmax:      2.35 m/s MV Vmean:     178.0 cm/s Mertie Moores MD Electronically signed by Mertie Moores MD Signature Date/Time: 09/20/2020/5:51:40 PM    Final     Cardiac Studies   TEE reviewed as above  Patient Profile     44 y.o. female with a hx of ESRD (TTS), HTN, HLD, SLEwho [facility] and management of  acute cerebellar infarct complicated by severe mitral stenosis and is now s/p TEE.   Assessment & Plan    1.  Severe mitral valve stenosis: Patient with severe mitral annular calcification and mitral leaflet thickening.  I have personally reviewed her TEE images and have also reviewed her images with some of our imaging specialist.  Her mitral valve changes with severe annular calcification are more consistent with valvular changes of end-stage renal disease.  This does not have the typical appearance of Libman-Sacks endocarditis that affects the leaflet tips.  Anticoagulation is not indicated at this time.  However, if atrial fibrillation is identified, she obviously should be anticoagulated.  With mitral stenosis and left atrial enlargement, the patient is at risk for atrial fibrillation and she therefore will undergo loop recorder implantation.  Appreciate the EP team seeing her.  Other problems are stable.  Her heart rate is much better controlled on metoprolol and diltiazem.  Hydralazine has been discontinued.  No further medication changes are recommended.  We will sign off on her case with plans for loop recorder implantation before she is discharged to [facility] inpatient rehab.  [facility] will sign off.   Medication Recommendations:  Continue same Rx Other  recommendations (labs, testing, etc):  Loop recorder implantation Follow up as an outpatient:  Will arrange  For questions or updates, please contact [place] Please consult [email or web address] for contact info under        Signed, Sherren Mocha, MD  09/21/2020, 3:15 PM

## 2020-09-21 NOTE — Progress Notes (Signed)
[facility] Progress Note   Subjective:  Patient underwent TEE yesterday without significant issues.  Mitral valve stenosis was present with also signs of Libman-Sacks endocarditis.  Patient feels well today.  Dizziness continues to improve.  Objective Vitals:   09/20/20 2204 09/20/20 2347 09/21/20 0337 09/21/20 0941  BP: (!) 156/95 (!) 130/99 113/68 115/86  Pulse: (!) 108 94 72 64  Resp: 17 10 16 20   Temp:  98 F (36.7 C) 97.6 F (36.4 C) 97.6 F (36.4 C)  TempSrc:  Oral Oral Oral  SpO2:  97% 100% 100%  Weight:      Height:         Additional Objective Labs: Basic Metabolic Panel: Recent Labs  Lab 09/18/20 0201 09/19/20 0242 09/20/20 0511  NA 135 134* 133*  K 3.2* 4.3 5.2*  CL 96* 97* 96*  CO2 27 25 22   GLUCOSE 135* 174* 156*  BUN 18 46* 71*  CREATININE 4.24* 6.85* 9.01*  CALCIUM 8.3* 8.1* 8.0*  PHOS 3.5 6.3*  --    CBC: Recent Labs  Lab 09/17/20 1303 09/17/20 1311 09/18/20 0201 09/19/20 0242 09/20/20 0511  WBC 12.1*   < > 9.4 10.9* 10.3  NEUTROABS 9.9*  --   --   --   --   HGB 10.9*   < > 11.3* 10.4* 10.3*  HCT 35.1*   < > 35.7* 33.6* 32.7*  MCV 91.9  --  90.4 91.6 90.1  PLT 78*   < > 75* 74* 75*   < > = values in this interval not displayed.   Blood Culture    Component Value Date/Time   SDES BLOOD RIGHT FOREARM UPPER 03/15/2020 0408   SPECREQUEST  03/15/2020 0408    BOTTLES DRAWN AEROBIC AND ANAEROBIC Blood Culture adequate volume   CULT  03/15/2020 0408    NO GROWTH 5 DAYS Performed at [facility], [place] [address] [place], [place] [address]    REPTSTATUS 03/20/2020 FINAL 03/15/2020 0408     Physical Exam General: lying in bed no distress Heart: normal rate Lungs: bilateral chest rise, no iwob Abdomen: Soft non-tender  Extremities: No lower extremity edema Dialysis Access: R IJ TDC dsg c,d,i; R AVF +bruit   Medications:  . aspirin EC  325 mg Oral Daily  . atorvastatin  40 mg Oral Daily  . calcium acetate  (Phos Binder)  1,334 mg Oral TID WC  . Chlorhexidine Gluconate Cloth  6 each Topical Daily  . diltiazem  240 mg Oral QHS  . metoprolol tartrate  50 mg Oral BID  . pantoprazole  40 mg Oral Daily  . PARoxetine  40 mg Oral Daily  . predniSONE  10 mg Oral Q breakfast  . traZODone  100 mg Oral QHS    Dialysis Orders:  [facility] TTS 3h 400/600 2K/2.5Ca EDW 58kg TDC Hep bolus 1800 EPO 6600 q HD Venofer 50 q Mon  Assessment/Plan: 1. Dizziness/Ataxia. MRI showing subcentimeter acute infarct involving the midline inferior vermis. Chronic left parietal infarct -per neuro/primary team. On antiplatelet therapy.  TEE demonstrated Libman-Sacks endocarditis.  I have not seen this very often and unsure if this can cause significant embolization and stroke.  Neurology recommends further testing to rule out atrial fibrillation. Should be discussed with cardiology whether surgical evaluation is necessary 2. ESRD -  HD TTS via TDC. OP fistulogram will be rescheduled 3. Hypertension/volume  - BP acceptable at this time 4. Anemia  - Hgb acceptable. No ESA needs currently  5. Metabolic bone disease -  Ca ok. Continue home binders (phoslyra). No VDRA.  6.  SLE - anti-dsdna norma, Ro and La high 7. Mitral stenosis: TEE w/ mitral stenosis and Libman-Sacks endocarditis. Concerns as above

## 2020-09-21 NOTE — Progress Notes (Signed)
Physical Therapy Treatment Patient Details Name: [name] MRN: [id number] DOB: 03-02-1976 Today's Date: 09/21/2020    History of Present Illness 44 yo admitted with dizziness with MRI demonstrating acute cerebellar infarct with chronic left parietal infarct. PMhx: ESRD, SLE, anemia, thrombocytopenia, HTN, HLD    PT Comments    Focused session on LE strengthening/endurance training as initial plan was to perform DGI but pt became weak/fatigued and requested to sit and rest. Her HR was in 60s and SpO2>96% throughout gait. She continues to display trunk sway in standing and mobility, resulting in her requiring minA for safety. Secondary to her high motivation and desire to participate and improve, young age, ability to withstand increased treatment durations, risk for falls, change in functional status compared to PLOF, and that she lives alone CIR will continue to be recommended. If CIR is not approved, then [facility] may be a possibility as she does report her niece could come stay with her and assist her 24/7 initially.   Follow Up Recommendations  CIR;Supervision for mobility/OOB     Equipment Recommendations  Other (comment) (TBD)    Recommendations for Other Services       Precautions / Restrictions Precautions Precautions: Fall Restrictions Weight Bearing Restrictions: No    Mobility  Bed Mobility Overal bed mobility: Modified Independent Bed Mobility: Supine to Sit;Sit to Supine;Rolling Rolling: Modified independent (Device/Increase time)   Supine to sit: Modified independent (Device/Increase time);HOB elevated Sit to supine: Modified independent (Device/Increase time);HOB elevated   General bed mobility comments: Extra time, but able to come to transition supine <> sit EOB with HOB elevated safely. Rolling with utilization of bed rails.  Transfers Overall transfer level: Needs assistance Equipment used: Straight cane Transfers: Sit to/from [employer] Sit to Stand: Min assist Stand pivot transfers: Min assist       General transfer comment: STS 1x and stand step transfers 2x with slightly increased time to power up to stand, but no overt LOB. MinA for safety as unsteadiness was noted.  Ambulation/Gait Ambulation/Gait assistance: Min assist Gait Distance (Feet): 50 Feet Assistive device: Straight cane Gait Pattern/deviations: Step-through pattern;Decreased stride length Gait velocity: decreased Gait velocity interpretation: <1.8 ft/sec, indicate of risk for recurrent falls General Gait Details: SPC in R hand, displaying appropriate use of cane. Displayed decreased stride length and moderate trunk sway which increased as distance progressed and pt became SOB but SpO2 remained >/= 96%. Pt noted feeling weak and requested to sit down. MinA to maintain balance, esp when cued to rotate head during gait.   Stairs             Wheelchair Mobility    Modified Rankin (Stroke Patients Only) Modified Rankin (Stroke Patients Only) Pre-Morbid Rankin Score: Slight disability Modified Rankin: Moderately severe disability     Balance Overall balance assessment: Needs assistance Sitting-balance support: No upper extremity supported;Feet supported Sitting balance-Leahy Scale: Good Sitting balance - Comments: Able to maintain static sitting balance EOB without trunk sway or LOB.   Standing balance support: Single extremity supported Standing balance-Leahy Scale: Fair Standing balance comment: Moderate trunk sway noted this date, requiring 1 UE support and minA at times to maintain balance.                            Cognition Arousal/Alertness: Awake/alert Behavior During Therapy: WFL for tasks assessed/performed Overall Cognitive Status: Within Functional Limits for tasks assessed  Exercises General Exercises - Lower Extremity Gluteal Sets:  AROM;Strengthening;Both;10 reps;Supine (bridging in bed) Long Arc Quad: AROM;Strengthening;Both;15 reps;Seated Hip ABduction/ADduction: AROM;Strengthening;Both;10 reps;Sidelying (clamshells) Hip Flexion/Marching: AROM;Strengthening;Both;15 reps;Seated    General Comments        Pertinent Vitals/Pain Pain Assessment: Faces Faces Pain Scale: No hurt Pain Intervention(s): Monitored during session;Limited activity within patient's tolerance    Home Living                      Prior Function            PT Goals (current goals can now be found in the care plan section) Acute Rehab PT Goals Patient Stated Goal: to go home and get better PT Goal Formulation: With patient Time For Goal Achievement: 10/02/20 Potential to Achieve Goals: Good Progress towards PT goals: Progressing toward goals    Frequency    Min 4X/week      PT Plan Current plan remains appropriate    Co-evaluation              AM-PAC PT "6 Clicks" Mobility   Outcome Measure  Help needed turning from your back to your side while in a flat bed without using bedrails?: None Help needed moving from lying on your back to sitting on the side of a flat bed without using bedrails?: A Little Help needed moving to and from a bed to a chair (including a wheelchair)?: A Little Help needed standing up from a chair using your arms (e.g., wheelchair or bedside chair)?: A Little Help needed to walk in hospital room?: A Little Help needed climbing 3-5 steps with a railing? : A Lot 6 Click Score: 18    End of Session Equipment Utilized During Treatment: Gait belt Activity Tolerance: Patient tolerated treatment well;Patient limited by fatigue Patient left: in bed;with call bell/phone within reach;with bed alarm set Nurse Communication: Mobility status PT Visit Diagnosis: Other abnormalities of gait and mobility (R26.89);Difficulty in walking, not elsewhere classified (R26.2);Other symptoms and signs  involving the nervous system (R29.898);Unsteadiness on feet (R26.81)     Time: 6808-8110 PT Time Calculation (min) (ACUTE ONLY): 19 min  Charges:  $Therapeutic Exercise: 8-22 mins                     Moishe Spice, PT, DPT Acute Rehabilitation Services  Pager: [phone] Office: [place] 09/21/2020, 12:26 PM

## 2020-09-21 NOTE — Discharge Summary (Addendum)
[facility] Discharge Summary  Patient name: [name] Medical record number: [id number] Date of birth: 1976-11-09 Age: 44 y.o. Gender: female Date of Admission: 09/17/2020  Date of Discharge: 09/23/20 Admitting Physician: Lyndee Hensen, DO  Primary Care Provider: Leeanne Rio, MD Consultants: Nephrology, neurology, cardiology   Indication for Hospitalization: Dizziness due to cerebellar CVA  Discharge Diagnoses/Problem List:  Acute cerebellar CVA HTN Mitral stenosis, LA dilation  Lupus ESRD Thrombocytopenia HLD Diabetes Anxiety Insomnia GERD  Disposition: CIR  Discharge Condition: Stable improved  Discharge Exam:  Temp:  [97.7 F (36.5 C)-98.1 F (36.7 C)] 97.8 F (36.6 C) (11/05 0316) Pulse Rate:  [59-68] 59 (11/05 0815) Resp:  [12-19] 13 (11/05 0815) BP: (85-125)/(60-84) 120/81 (11/05 0815) SpO2:  [98 %-100 %] 100 % (11/05 0815) Physical Exam: General:Alert, in no acute distress Cardiovascular:holosystolic murmur, RRR, normal S1 and S2 Respiratory:CTA bilaterally Abdomen:Non tender, non distended Extremities:non-pitting edema of BLE, left worse than right  Brief Hospital Course:  [name] is a 44 y.o. female who presented on 10/29 with dizziness accompanied with vomiting and one episode of diarrheal incontinence. 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 PMH is significant for lupus, ESRD, HTN, and HLD.   Acute Cerebellar CVA  Pt experienced dizziness throughout her admission. MR Brain showed a subcentimeter acute infarct involving the midline inferior vermis and an age indeterminate infarct of the posterior left frontal lobe. Pt on Aspirin 325 mg. DAPT held due to thrombocytopenia. Cardiology placed a loop recorder to r/o a fib as cause of strokes.  TEE ruled out Libman-Sacks endocarditis as a possible cause of her strokes.  Patient was evaluated by inpatient rehabilitation and approved for CIR.  Patient will  continue rehab with inpatient rehabilitation.  Mitral Stenosis, LA dilation  Frequent PVCs seen on monitor here. Echocardiogram on 10/31 showed LVEF of 55 - 60%, severe concentric left ventricular hypertrophy, severely dilated LA, calcification of mitral valve leaflets with moderate regurgitation and severe mitral stenosis. Severe tricuspid regurgitation.Telemetry shows evidence of atrial tachyarrhythmias. TEE on 11/1 showed no evidence of mitral vegetations or LAA thrombi.   Lupus  Diagnosed with Lupus in 2005. Takes 10 mg prednisone. Follows with Dr. Elpidio Galea. States her Lupus is in remission. Home medications were continued.  HTN  Takes Diltiazem 240 mg  XR, Hydralazine 25 mg BID at home. Hydralazine d/c.Discharged on Diltiazem 120 mg and metoprolol 25 mg twice daily.  ESRD  Anemia of Chronic Disease  Lupus related ESRD.  Dialysis on T/Th/Sat. has right IJ tunneled catheter.  Home medications include Ren-a-vit 800 mg, Phos-lo. Home medications and dialysis continued.  Thrombocytopenia Plt 78 on admission.  Baseline 110s in April 2021, appears chronic.  95 on discharge.  Transaminitis  AST 78 and ALT 105 on admission, normal on 11/2. Denies alcohol use or illicit drug use. Hep B non-reactive on 10/31.  Resolved on discharge.  Pre Diabetes ? No home diabetic medications, HgbA1c 7.2, goal < 7.0, blood glucose monitored, should follow up with PCP.  Hyperkalemia On 11/5 patient's potassium was 5.5.  Patient was dialyzed so this should have resolved.  Recommend recheck of BMP in week following discharge.  Other chronic problems including anxiety, insomnia and GERD, stable and treated with home medications    Issues for Follow Up:  1. Loop recorder placed. F/u with cardiology 2. Blood pressure follow-up.  Hydralazine was discontinued and the patient was started on 25 mg metoprolol twice daily.  Monitor blood pressures and determine need for further  agents. 3. Patient's potassium at  discharge was 5.5.  She was given [facility] before discharge but needs to have potassium checked in the next week.  Significant Procedures:  Echocardiogram TEE   Significant Labs and Imaging:  Recent Labs  Lab 09/21/20 1810 09/22/20 1230 09/23/20 0741  WBC 11.9* 12.4* 11.9*  HGB 11.8* 12.3 12.7  HCT 38.0 39.2 34.4*  PLT 106* 99* 95*   Recent Labs  Lab 09/17/20 1303 09/17/20 1311 09/18/20 0201 09/18/20 0201 09/19/20 0242 09/19/20 0242 09/20/20 0511 09/20/20 0511 09/21/20 1810 09/21/20 1810 09/22/20 0957 09/23/20 0154  NA 136   < > 135   < > 134*  --  133*  --  137  --  136 136  K 5.3*   < > 3.2*   < > 4.3   < > 5.2*   < > 5.2*   < > 5.7* 5.5*  CL 98   < > 96*   < > 97*  --  96*  --  101  --  98 100  CO2 24   < > 27   < > 25  --  22  --  20*  --  21* 20*  GLUCOSE 170*   < > 135*   < > 174*  --  156*  --  188*  --  103* 155*  BUN 56*   < > 18   < > 46*  --  71*  --  50*  --  65* 77*  CREATININE 8.21*   < > 4.24*   < > 6.85*  --  9.01*  --  6.64*  --  7.64* 8.83*  CALCIUM 8.9   < > 8.3*   < > 8.1*  --  8.0*  --  8.1*  --  8.2* 8.0*  PHOS  --   --  3.5  --  6.3*  --   --   --  6.6*  --  7.9* 7.9*  ALKPHOS 123  --   --   --   --   --  100  --   --   --   --   --   AST 78*  --   --   --   --   --  16  --   --   --   --   --   ALT 105*  --  84*  --   --   --  40  --   --   --   --   --   ALBUMIN 3.1*   < > 3.0*   < > 2.6*  --  2.7*  --  2.9*  --  3.0* 2.8*   < > = values in this interval not displayed.     Results/Tests Pending at Time of Discharge: None  Discharge Medications:  Allergies as of 09/23/2020      Reactions   Amlodipine Hives, Swelling   Sulfa Antibiotics Hives, Itching   Vancomycin Hives, Itching   Venlafaxine Other (See Comments)   Emotional      Medication List    STOP taking these medications   diltiazem 240 MG 24 hr capsule Commonly known as: TIAZAC   hydrALAZINE 25 MG tablet Commonly known as: APRESOLINE   HYDROcodone-acetaminophen 5-325  MG tablet Commonly known as: NORCO/VICODIN   meclizine 25 MG tablet Commonly known as: ANTIVERT     TAKE these medications   acetaminophen 500 MG tablet Commonly known as: TYLENOL Take  1,000 mg by mouth every 6 (six) hours as needed for moderate pain or headache.   aspirin 325 MG EC tablet Take 1 tablet (325 mg total) by mouth daily.   atorvastatin 40 MG tablet Commonly known as: LIPITOR Take 1 tablet (40 mg total) by mouth daily.   b complex-vitamin c-folic acid 0.8 MG Tabs tablet Take 1 tablet by mouth daily at 12 noon.   clopidogrel 75 MG tablet Commonly known as: PLAVIX Take 75 mg by mouth daily.   LUBRICATING EYE DROPS OP Place 1 drop into both eyes daily as needed (dry eyes).   metoprolol tartrate 25 MG tablet Commonly known as: LOPRESSOR Take 1 tablet (25 mg total) by mouth 2 (two) times daily.   pantoprazole 40 MG tablet Commonly known as: PROTONIX Take 1 tablet (40 mg total) by mouth daily.   PARoxetine 40 MG tablet Commonly known as: PAXIL Take 40 mg by mouth daily.   Phoslyra 667 MG/5ML Soln Generic drug: calcium acetate (Phos Binder) Take 2,001 mg by mouth See admin instructions. Take 15 mls (2001 mg) by mouth with each meal and snack - up to 9 times daily   predniSONE 10 MG tablet Commonly known as: DELTASONE Take 1 tablet (10 mg total) by mouth daily with breakfast.   traZODone 100 MG tablet Commonly known as: DESYREL Take 100 mg by mouth at bedtime.            Discharge Care Instructions  (From admission, onward)         Start     Ordered   09/23/20 0000  Discharge wound care:       Comments: Monitor for signs of infection   09/23/20 1401          Discharge Instructions: Please refer to Patient Instructions section of EMR for full details.  Patient was counseled important signs and symptoms that should prompt return to medical care, changes in medications, dietary instructions, activity restrictions, and follow up appointments.    Follow-Up Appointments:  Follow-up Information    Garvin Fila, MD. Schedule an appointment as soon as possible for a visit in 4 week(s).   Specialties: Neurology, Radiology Contact information: [address]  [place] [address] [place] Follow up on 10/05/2020.   Why: at 1145 for post loop recorder wound check.  Contact information: [place] [address] [phone]              Gifford Shave, MD 09/23/2020, 2:05 PM OMS-IV AI, [place]

## 2020-09-21 NOTE — Progress Notes (Signed)
STROKE TEAM PROGRESS NOTE   INTERVAL HISTORY No family at bedside.  Patient sitting in bed, no acute distress, no event overnight.  Cardiology EP planning for loop recorder before discharge.  PT OT recommend CIR.  OBJECTIVE Vitals:   09/20/20 2204 09/20/20 2347 09/21/20 0337 09/21/20 0941  BP: (!) 156/95 (!) 130/99 113/68 115/86  Pulse: (!) 108 94 72 64  Resp: _0 Temp:  98 F (36.7 C) 97.6 F (36.4 C) 97.6 F (36.4 C)  TempSrc:  Oral Oral Oral  SpO2:  97% 100% 100%  Weight:      Height:       CBC:  Recent Labs  Lab 09/17/20 1303 09/17/20 1311 09/19/20 0242 09/20/20 0511  WBC 12.1*   < > 10.9* 10.3  NEUTROABS 9.9*  --   --   --   HGB 10.9*   < > 10.4* 10.3*  HCT 35.1*   < > 33.6* 32.7*  MCV 91.9   < > 91.6 90.1  PLT 78*   < > 74* 75*   < > = values in this interval not displayed.   Basic Metabolic Panel:  Recent Labs  Lab 09/18/20 0201 09/18/20 0201 09/19/20 0242 09/20/20 0511  NA 135   < > 134* 133*  K 3.2*   < > 4.3 5.2*  CL 96*   < > 97* 96*  CO2 27   < > 25 22  GLUCOSE 135*   < > 174* 156*  BUN 18   < > 46* 71*  CREATININE 4.24*   < > 6.85* 9.01*  CALCIUM 8.3*   < > 8.1* 8.0*  PHOS 3.5  --  6.3*  --    < > = values in this interval not displayed.   Lipid Panel:     Component Value Date/Time   CHOL 209 (H) 09/18/2020 0201   TRIG 135 09/18/2020 0201   HDL 77 09/18/2020 0201   CHOLHDL 2.7 09/18/2020 0201   VLDL 27 09/18/2020 0201   LDLCALC 105 (H) 09/18/2020 0201   HgbA1c:  Lab Results  Component Value Date   HGBA1C 7.2 (H) 09/18/2020   Urine Drug Screen: No results found for: LABOPIA, COCAINSCRNUR, LABBENZ, AMPHETMU, THCU, LABBARB  Alcohol Level     Component Value Date/Time   ETH <10 09/17/2020 1303    IMAGING  CT ANGIO HEAD W OR WO CONTRAST CT ANGIO NECK W OR WO CONTRAST 09/18/2020 IMPRESSION:  Small acute midline cerebellar infarct as seen on MRI. No new intracranial abnormality. No hemodynamically significant stenosis in  the neck. No proximal intracranial vessel occlusion. 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   MR BRAIN WO CONTRAST 09/17/2020 IMPRESSION:  Motion degraded. Subcentimeter acute infarct involving the midline inferior vermis. Chronic left parietal infarct.   VAS [place] LOWER EXTREMITY VENOUS (DVT) 09/18/2020 Summary:  BILATERAL: - No evidence of deep vein thrombosis seen in the lower extremities, bilaterally. -  Transthoracic Echocardiogram  1. Left ventricular ejection fraction, by estimation, is 55 to 60%. The left ventricle has normal function. The left ventricle has no regional wall motion abnormalities. There is severe concentric left ventricular hypertrophy. Diastolic function indeterminant due to moderate-to-severe MAC.  2. Right ventricular systolic function is mildly reduced. The right ventricular size is normal. There is moderately elevated pulmonary artery systolic pressure.  3. Left atrial size was severely dilated.  4. Right atrial size was mildly dilated.  5. The mitral valve is degenerative. Moderate mitral valve  regurgitation. Severe mitral stenosis with mean gradient 86mHg and MVA of 0.73cm2 by continuity equation at HR 88bpm. Moderate to severe mitral annular calcification.  6. Tricuspid valve regurgitation is severe.  7. The aortic valve is tricuspid. There is mild calcification of the aortic valve. There is mild thickening of the aortic valve. Aortic valve regurgitation is mild. Mild to moderate aortic valve sclerosis/calcification is present, without any evidence of aortic stenosis.  8. The inferior vena cava is normal in size with <50% respiratory variability, suggesting right atrial pressure of 8 mmHg.   TEE 09/20/2020  Left Ventrical:  Normal LV function  Mitral Valve: markedly calcified MV.   Severe mitral stenosis.  Mean MS gradient of 16 mmHg.  Moderate MR .   There is no reversal of flow in the pulmonary veins.  Aortic  Valve:  Mild - mod AI.  No AS  Tricuspid Valve: severe TR  Pulmonic Valve: grossly normal  Left Atrium/ Left atrial appendage: seen well.  No LAA thrombi  Atrial septum: no evidence of ASD or PFO.   During the bubble study, there were bubbles that appeared in the LA after 8-9 cardiac cycles.   This is more c/w a transpulmonary shunt or AVM Aorta: normal  ECG - SR rate 89 BPM. (See cardiology reading for complete details)   PHYSICAL EXAM  Temp:  [97.1 F (36.2 C)-98.4 F (36.9 C)] 97.6 F (36.4 C) (11/03 0941) Pulse Rate:  [64-109] 64 (11/03 0941) Resp:  [10-21] 20 (11/03 0941) BP: (113-192)/(68-103) 115/86 (11/03 0941) SpO2:  [97 %-100 %] 100 % (11/03 0941)  General - Well nourished, well developed, in no apparent distress.  Ophthalmologic - fundi not visualized due to noncooperation.  Cardiovascular - Regular rhythm and rate.  Mental Status -  Level of arousal and orientation to time, place, and person were intact. Language including expression, naming, repetition, comprehension was assessed and found intact. Fund of Knowledge was assessed and was intact.  Cranial Nerves II - XII - II - Visual field intact OU. III, IV, VI - Extraocular movements intact. V - Facial sensation intact bilaterally. VII - Facial movement intact bilaterally. VIII - Hearing & vestibular intact bilaterally. X - Palate elevates symmetrically. XI - Chin turning & shoulder shrug intact bilaterally. XII - Tongue protrusion intact.  Motor Strength - The patient's strength was normal in all extremities and pronator drift was absent.  Bulk was normal and fasciculations were absent.   Motor Tone - Muscle tone was assessed at the neck and appendages and was normal.  Reflexes - The patient's reflexes were symmetrical in all extremities and she had no pathological reflexes.  Sensory - Light touch, temperature/pinprick were assessed and were symmetrical.    Coordination - The patient had grossly normal  movements in the hands and feet with no ataxia or dysmetria.  Tremor was absent.  Gait and Station - deferred.   ASSESSMENT/PLAN Ms. Mary VILLATOROis a 44y.o. female with history of lupus, ESRD (TThSa HD), HTN (controlled), secondary hyperparathyroidism, depression, chronic tremor, extremity edema and anxiety who presents today with a ~12 hour history of room-spinning dizziness accompanied by nausea, vomiting, and stool incontinence. Ms. TNoredstates that she has experienced dizzy spells in the past but they were self-limiting and resolved rather quickly. She did not receive IV t-PA due to late presentation (>4.5 hours from time of onset).   Stroke: acute cerebellar vermis small infarct.  Given history of lupus, chronic left parietal infarct, concerning for embolic  stroke this time - source unknown.   MRI head - Subcentimeter acute infarct involving the midline inferior vermis. Chronic left parietal infarct.   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   2D Echo - EF 55 to 60%, severe left atrial dilatation, severe mitral stenosis  LE venous Doppler negative for DVT  TEE no endocarditis or thrombus, severe MS, TR, no PFO or ASD, late bubbles c/w pulm shunt  Agree with Dr. Burt Knack to consider loop recorder to rule out afib prior to discharge  [employer] Virus 2  - negative  LDL - 105  HgbA1c - 7.2  VTE prophylaxis - SCDs  No antithrombotic prior to admission, now on ASA 325 mg daily, avoid DAPT now given thrombocytopenia.   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 Burt Knack to consider loop recorder to rule out afib  Hypertension  Home BP meds: diltiazem ; hydralazine  Current BP meds: diltiazem, metoprolol  Stable . Long-term BP goal normotensive  Hyperlipidemia  Home Lipid lowering medication: none   LDL 105, goal < 70  Current lipid lowering medication: Lipitor 40 mg daily   transaminitis AST / ALT 78/105-> 16/40  Continue statin at discharge  Diabetes, new diagnosis  Home diabetic meds: none   HgbA1c 7.2, goal < 7.0  SSI  CBG monitoring  PCP follow-up  Thrombocytopenia  platelets - 78->75->74->75  Platelet stable, increased aspirin 81 to 325  Avoid DAPT for now  Other Stroke Risk Factors   History of stroke, left parietal chronic infarct on imaging  Other Active Problems  Hyperkalemia 5.2  ESRD on HD (Tue - Thurs - Sat)   Anemia - Hgb - 10.3 (chronic disease)  Mild Leukocytosis - WBCs - 10.3 - likely 2/2 prednisone - afebrile   Anxiety - on paroxetine  Insomnia - on trazodone  GERD - on PPI  Hospital day # 4  Neurology will sign off. Please call with questions. Pt will follow up with stroke clinic Dr. Leonie Man at [facility] in about 4 weeks. Thanks for the consult.   Rosalin Hawking, MD PhD Stroke Neurology 09/21/2020 12:16 PM  To contact Stroke Continuity provider, please refer to [email or web address] After hours, contact General Neurology

## 2020-09-21 NOTE — Progress Notes (Addendum)
Family Medicine Teaching Service Daily Progress Note Intern Pager: [phone]  Patient name: [name] Medical record number: [id number] Date of birth: 1976-04-06 Age: 44 y.o. Gender: female  Primary Care Provider: Leeanne Rio, MD Consultants: Neurology, nephrology, and cardiology Code Status: Full  Pt Overview and Major Events to Date:  Admitted 10/30  Assessment and Plan: Kiauna Zywicki Tottenis a 44 y.o.femalepresenting with dizziness accompanied with vomiting and one episode of diarrheal incontinence.Found to have acute cerebellar CVA.PMH is significant for lupus, ESRD, HTN, and HLD.  Acute CerebellarCVA Source continues to be unknown. This morning pt still a little dizzy, but improving significantly. No FND on exam. 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  Reviewed this with heart care team who did not think that this was Donzetta Starch endocarditis.  We will continue plan of loop recorder placement at discharge to rule out A. fib as cause of strokes. - Neurologyconsulted, appreciate recommendations - PT/OT recs for CIR, CIR has been consulted  -Aspirin325mg , statin 40 mg   Mitral stenosis, LA dilation Cardiology recommends OP cardiac monitor to r/o arrhythmias  - Cardiology consulted, appreciate recommendations  HTN  BP today 113/68. Denies chest pain  - Continue diltiazem  - Metoprolol increased to 50 mg BID   Lupus, stable Follows with Dr. Elpidio Galea. States herLupusisin remission. 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   - Nephrologyconsulted appreciate recommendations - HD and medications per nephrology - Renal diet   Thrombocytopenia Likely due to lupus. No active bleeding. Platelets stable.  -Avoid DAPT,  on Aspirin 325 mg  HLD LDL 105  on 10/31, goal <70 -Continue atorvastatin  Diabetes  Not treated at home at this time. Hgb A1c 7.2.  -CBG monitoring - Outpatient f/u   Anxiety Home medications includeParoxetine 40 mg daily. -Continue home medication  Insomnia Home medications includeTrazodone 100 mg nightly -Continue at bedtime  GERD Home medications includeProtonix 40 mg daily. - Continue home medication  FEN/GI: renal diet w/ fluid restriction to 1200 ml  PPx: SCDs  Disposition: CIR consulted for admission.   Subjective:  Pt states she is not feeling dizzy today. She denies headache, chest pain, SOB, and abdominal pain. She admits to one hard BM yesterday.   Objective: Temp:  [97.1 F (36.2 C)-98.4 F (36.9 C)] 97.6 F (36.4 C) (11/03 0337) Pulse Rate:  [72-109] 72 (11/03 0337) Resp:  [10-21] 16 (11/03 0337) BP: (113-192)/(68-103) 113/68 (11/03 0337) SpO2:  [97 %-100 %] 100 % (11/03 0337) Weight:  [57.8 kg] 57.8 kg (11/02 1113) Physical Exam: General: alert, in no acute distress Cardiovascular: holosystolic murmur, RRR, normal S1 and S2 Respiratory: CTA bilaterally Abdomen: non tender, non distended  Extremities: non-pitting edema of BLE, left worse than right.  Laboratory: Recent Labs  Lab 09/18/20 0201 09/19/20 0242 09/20/20 0511  WBC 9.4 10.9* 10.3  HGB 11.3* 10.4* 10.3*  HCT 35.7* 33.6* 32.7*  PLT 75* 74* 75*   Recent Labs  Lab 09/17/20 1303 09/17/20 1311 09/18/20 0201 09/19/20 0242 09/20/20 0511  NA 136   < > 135 134* 133*  K 5.3*   < > 3.2* 4.3 5.2*  CL 98   < > 96* 97* 96*  CO2 24   < > 27 25 22   BUN 56*   < > 18 46* 71*  CREATININE 8.21*   < >  4.24* 6.85* 9.01*  CALCIUM 8.9   < > 8.3* 8.1* 8.0*  PROT 5.7*  --   --   --  5.3*  BILITOT 0.9  --   --   --  0.5  ALKPHOS 123  --   --   --  100  ALT 105*  --  84*  --  40  AST 78*  --   --   --  16  GLUCOSE 170*   < > 135* 174* 156*   < > = values in this interval not displayed.      Imaging/Diagnostic  Tests: ECHO TEE  Result Date: 09/20/2020    TRANSESOPHOGEAL ECHO REPORT   Patient Name:   PIERRA SKORA Date of Exam: 09/20/2020 Medical Rec #:  [id number]          Height:       64.0 in Accession #:    [id number]         Weight:       127.4 lb Date of Birth:  October 24, 1976           BSA:          1.615 m Patient Age:    74 years           BP:           180/95 mmHg Patient Gender: F                  HR:           107 bpm. Exam Location:  Inpatient Procedure: Transesophageal Echo, 3D Echo, Color Doppler, Cardiac Doppler and            Saline Contrast Bubble Study Indications:     Stroke  History:         Patient has prior history of Echocardiogram examinations, most                  recent 09/18/2020. Risk Factors:Hypertension, Dyslipidemia and                  Lupus.  Sonographer:     Raquel Sarna Senior RDCS Referring Phys:  [place] Diagnosing Phys: Mertie Moores MD PROCEDURE: After discussion of the risks and benefits of a TEE, an informed consent was obtained from the patient. The transesophogeal probe was passed without difficulty through the esophogus of the patient. Local oropharyngeal anesthetic was provided with Cetacaine. Sedation performed by different physician. The patient was monitored while under deep sedation. Anesthestetic sedation was provided intravenously by Anesthesiology: 161mg  of Propofol, 40mg  of Lidocaine. The patient developed no complications during the procedure. IMPRESSIONS  1. Left ventricular ejection fraction, by estimation, is 55 to 60%. The left ventricle has normal function.  2. Right ventricular systolic function is normal. The right ventricular size is normal.  3. No left atrial/left atrial appendage thrombus was detected.  4. The mean MS gradient is 14 mmhg.     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 This could be the cause of her stroke.     3-D images of the mitral valve  were obtained. . The mitral valve is abnormal. Moderate to severe mitral valve regurgitation. Moderate to severe mitral stenosis. Moderate to severe mitral annular calcification.  5. Tricuspid valve regurgitation is moderate to severe.  6. The aortic valve is grossly normal. Aortic valve regurgitation is mild to moderate.  FINDINGS  Left Ventricle: Left ventricular ejection fraction, by estimation, is 55 to 60%. The left ventricle has normal function. The left ventricular internal cavity size was normal in size. Right Ventricle: The right ventricular size is normal. No increase in right ventricular wall thickness. Right ventricular systolic function is normal. Left Atrium: Left atrial size was not well visualized. No left atrial/left atrial appendage thrombus was detected. Right Atrium: Right atrial size was not well visualized. Pericardium: There is no evidence of pericardial effusion. Mitral Valve: The mean MS gradient is 14 mmhg. 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 This could be the cause of her stroke. 3-D images of the mitral valve were obtained. The mitral valve is abnormal. There is severe thickening of the mitral valve leaflet(s). There is severe calcification of the mitral valve leaflet(s). Moderate to severe mitral annular calcification. Moderate  to severe mitral valve regurgitation. Moderate to severe mitral valve stenosis. MV peak gradient, 22.1 mmHg. The mean mitral valve gradient is 14.0 mmHg. Pulmonary venous flow is normal. Tricuspid Valve: The tricuspid valve is grossly normal. Tricuspid valve regurgitation is moderate to severe. Aortic Valve: The aortic valve is grossly normal. Aortic valve regurgitation is mild to moderate. Pulmonic Valve: The pulmonic valve was grossly normal. Pulmonic valve regurgitation is not visualized. Aorta: The aortic root and ascending aorta are structurally normal, with  no evidence of dilitation. IAS/Shunts: No atrial level shunt detected by color flow Doppler. Agitated saline contrast was given intravenously to evaluate for intracardiac shunting. With bubble contrast, there were bubbles that appeared in the LA aproximately 8-9 cardiac cycles after injection. These are most likely due to a transpulmonary AVM or shunt. This is not c/w an ASD or PFO.  MITRAL VALVE MV Peak grad: 22.1 mmHg MV Mean grad: 14.0 mmHg MV Vmax:      2.35 m/s MV Vmean:     178.0 cm/s Mertie Moores MD Electronically signed by Mertie Moores MD Signature Date/Time: 09/20/2020/5:51:40 PM    Final     Micheline Rough, Medical Student 09/21/2020, 7:42 AM OMS-IV AI, [place] Intern pager: [phone], text pages welcome  FPTS Upper-Level Resident Addendum I have independently interviewed and examined the patient. I have discussed the above with the original author and agree with their documentation. My edits for correction/addition/clarification are included. Please see also any attending notes.  [place] Service pager: [phone] (text pages welcome through [facility])  Wilber Oliphant, M.D.  PGY-3 09/21/2020 3:06 PM

## 2020-09-21 NOTE — H&P (View-Only) (Signed)
ELECTROPHYSIOLOGY CONSULT NOTE  Patient ID: [name] MRN: [id number], DOB/AGE: May 31, 1976   Admit date: 09/17/2020 Date of Consult: 09/21/2020  Primary Physician: Leeanne Rio, MD Primary Cardiologist: Kate Sable, MD (Inactive)  Primary Electrophysiologist: New to Dr. Rayann Heman Reason for Consultation: Cryptogenic stroke; recommendations regarding Implantable Loop Recorder Insurance: [employer]  History of Present Illness [name] is a 44 y.o. female with h/o lupus, ESRD (T/Th/Sa), HTN, secondary hyperparathyroidism, depression, chronic tremor, extremity edema and anxiety.   EP has been asked to evaluate [name] for placement of an implantable loop recorder to monitor for atrial fibrillation by Dr Erlinda Hong.  The patient was admitted on 09/17/2020 with room-spinning dizziness, N/V, and stool incontinence. IV t-PA not given due to late presentation (12 hours of symptoms)   They have undergone workup for stroke including echocardiogram, TEE, and below: Imaging demonstrated: MRI head 10/30- Subcentimeter acute infarct involving the midline inferior vermis. Chronic left parietal infarct.  CTA H&N 10/31- Moderate stenosis of the supraclinoid internal carotid arteries bilaterally.  2D Echo 10/31- EF 55 to 60%, severe left atrial dilatation, severe mitral stenosis LE venous Doppler 10/31 negative for DVT TEE 11/2 no endocarditis or thrombus, severe MS, TR, no PFO or ASD, late bubbles c/w pulm shunt or AVM  The patient has been monitored on telemetry which has demonstrated sinus rhythm with no arrhythmias.    Dr. Burt Knack was also consulted for her MS and recommended monitoring for AF with loop.  She was previously seen by Dr. Bronson Ing in 2019 for palpitations. 21 day monitor in Feb 2019 was negative for AF or arryhtmia. Showed NSR with rare PACs  Prior to admission, the patient denies chest pain, shortness of breath, dizziness, palpitations, or syncope.  They are  recovering from their stroke with plans to attend CIR  at discharge.  Past Medical History:  Diagnosis Date  . Allergy   . Anemia   . Anxiety   . Arthritis   . Avascular necrosis of bone (HCC)    left tibial talus due to chronic prednisone use  . Cerebellar stroke ([place])   . Depression   . Dyspnea    too much  fluid  . ESRD (end stage renal disease) on dialysis ([place])    tthsat [place]  . GERD (gastroesophageal reflux disease)   . Headache(784.0)   . History of high blood pressure   . Hyperlipemia   . Hypertension   . Lupus ([place])   . Pneumonia   . Secondary hyperparathyroidism (of renal origin)      Surgical History:  Past Surgical History:  Procedure Laterality Date  . A/V FISTULAGRAM Left 12/02/2017   Procedure: A/V FISTULAGRAM - Left upper;  Surgeon: Angelia Mould, MD;  Location: [place] [facility];  Service: Cardiovascular;  Laterality: Left;  . AV FISTULA PLACEMENT Left   . AV FISTULA PLACEMENT Right 01/26/2014   Procedure: ARTERIOVENOUS (AV) FISTULA CREATION; ultrasound guided;  Surgeon: Conrad Pecos, MD;  Location: [place];  Service: Vascular;  Laterality: Right;  . AV FISTULA PLACEMENT Right 04/25/2020   Procedure: RIGHT UPPER ARM  BASILIC VEIN ARTERIOVENOUS (AV) FISTULA;  Surgeon: [name], MD;  Location: [place];  Service: Vascular;  Laterality: Right;  . BASCILIC VEIN TRANSPOSITION Right 07/01/2020   Procedure: SECOND STAGE BASCILIC VEIN TRANSPOSITION RIGHT UPPER EXTREMITY;  Surgeon: [name], MD;  Location: [facility] OR;  Service: Vascular;  Laterality: Right;  . ESOPHAGOGASTRODUODENOSCOPY N/A 03/17/2016   Procedure: ESOPHAGOGASTRODUODENOSCOPY (  EGD);  Surgeon: Irene Shipper, MD;  Location: [place];  Service: Endoscopy;  Laterality: N/A;  . FISTULOGRAM Left 09/24/2019   Procedure: FISTULOGRAM LEFT ARM;  Surgeon: [name], MD;  Location: [place];  Service: Vascular;  Laterality: Left;  . HEMATOMA EVACUATION Left 10/09/2013    Procedure: EVACUATION HEMATOMA;  Surgeon: Angelia Mould, MD;  Location: [place];  Service: Vascular;  Laterality: Left;  . HEMATOMA EVACUATION Left 09/24/2019   Procedure: EVACUATION HEMATOMA  LEFT ARM;  Surgeon: [name], MD;  Location: [place];  Service: Vascular;  Laterality: Left;  . INCISION AND DRAINAGE ABSCESS     gluteal  . INSERTION OF DIALYSIS CATHETER N/A 10/09/2013   Procedure: INSERTION OF DIALYSIS CATHETER; ULTRASOUND GUIDED;  Surgeon: Angelia Mould, MD;  Location: [place];  Service: Vascular;  Laterality: N/A;  . INSERTION OF DIALYSIS CATHETER Left 09/24/2019   Procedure: INSERTION OF TUNNELLED DIALYSIS CATHETER - PALINDROME 15FR X 28CM;  Surgeon: [name], MD;  Location: [place];  Service: Vascular;  Laterality: Left;  . INSERTION OF DIALYSIS CATHETER Right 03/15/2020   Procedure: Insertion Of Dialysis Catheter;  Surgeon: Angelia Mould, MD;  Location: [place];  Service: Cardiovascular;  Laterality: Right;  . PERIPHERAL VASCULAR BALLOON ANGIOPLASTY Left 12/02/2017   Procedure: PERIPHERAL VASCULAR BALLOON ANGIOPLASTY;  Surgeon: Angelia Mould, MD;  Location: [place] [facility];  Service: Cardiovascular;  Laterality: Left;  . REVISION OF ARTERIOVENOUS GORETEX GRAFT Left 04/03/2019   Procedure: REVISION OF ARTERIOVENOUS GORETEX GRAFT PSEUDOANEURYSM;  Surgeon: Angelia Mould, MD;  Location: [place];  Service: Vascular;  Laterality: Left;  . SHUNTOGRAM N/A 01/07/2014   Procedure: fistulogram with possibe venoplasty left upper arm avg;  Surgeon: Angelia Mould, MD;  Location: [facility];  Service: Cardiovascular;  Laterality: N/A;  . THROMBECTOMY AND REVISION OF ARTERIOVENTOUS (AV) GORETEX  GRAFT Left 03/15/2020   Procedure: ATTEMPTED THROMBECTOMY OF LEFT ARM ARTERIOVENTOUS (AV) GORETEX  GRAFT;  Surgeon: Angelia Mould, MD;  Location: [place];  Service: Cardiovascular;  Laterality: Left;  . tibiocalcaneal fusion  left Left   .  ULTRASOUND GUIDANCE FOR VASCULAR ACCESS  09/24/2019   Procedure: Ultrasound Guidance For Vascular Access;  Surgeon: [name], MD;  Location: [place];  Service: Vascular;;     Medications Prior to Admission  Medication Sig Dispense Refill Last Dose  . acetaminophen (TYLENOL) 500 MG tablet Take 1,000 mg by mouth every 6 (six) hours as needed for moderate pain or headache.    month ago  . b complex-vitamin c-folic acid (NEPHRO-VITE) 0.8 MG TABS tablet Take 1 tablet by mouth daily at 12 noon.   09/16/2020  . calcium acetate, Phos Binder, (PHOSLYRA) 667 MG/5ML SOLN Take 2,001 mg by mouth See admin instructions. Take 15 mls (2001 mg) by mouth with each meal and snack - up to 9 times daily   09/16/2020 at pm  . Carboxymethylcellul-Glycerin (LUBRICATING EYE DROPS OP) Place 1 drop into both eyes daily as needed (dry eyes).   09/16/2020  . diltiazem (TIAZAC) 240 MG 24 hr capsule Take 240 mg by mouth at bedtime.    09/16/2020  . hydrALAZINE (APRESOLINE) 25 MG tablet Take 25 mg by mouth in the morning and at bedtime.   09/16/2020  . pantoprazole (PROTONIX) 40 MG tablet Take 1 tablet (40 mg total) by mouth daily. 30 tablet 3 09/16/2020 at am  . PARoxetine (PAXIL) 40 MG tablet Take 40 mg by mouth daily.  09/16/2020 at am  . predniSONE (DELTASONE) 10 MG tablet Take 1 tablet (10 mg total) by mouth daily with breakfast. 90 tablet 3 09/16/2020 at am  . traZODone (DESYREL) 100 MG tablet Take 100 mg by mouth at bedtime.   09/16/2020 at pm  . clopidogrel (PLAVIX) 75 MG tablet Take 75 mg by mouth daily.   not yet  . HYDROcodone-acetaminophen (NORCO/VICODIN) 5-325 MG tablet Take 1 tablet by mouth every 6 (six) hours as needed for moderate pain. (Patient not taking: Reported on 09/14/2020) 12 tablet 0 Not Taking at Unknown time  . meclizine (ANTIVERT) 25 MG tablet Take 25 mg by mouth 3 (three) times daily as needed.   not yet    Inpatient Medications:  . aspirin EC  325 mg Oral Daily  . atorvastatin  40 mg  Oral Daily  . calcium acetate (Phos Binder)  1,334 mg Oral TID WC  . Chlorhexidine Gluconate Cloth  6 each Topical Daily  . diltiazem  240 mg Oral QHS  . metoprolol tartrate  50 mg Oral BID  . pantoprazole  40 mg Oral Daily  . PARoxetine  40 mg Oral Daily  . predniSONE  10 mg Oral Q breakfast  . traZODone  100 mg Oral QHS    Allergies:  Allergies  Allergen Reactions  . Amlodipine Hives and Swelling  . Sulfa Antibiotics Hives and Itching  . Vancomycin Hives and Itching  . Venlafaxine Other (See Comments)    Emotional    Social History   Socioeconomic History  . Marital status: Single    Spouse name: none  . Number of children: 0  . Years of education: 7  . Highest education level: Some college, no degree  Occupational History  . Occupation: unemployed    Comment: disability  Tobacco Use  . Smoking status: Never Smoker  . Smokeless tobacco: Never Used  Vaping Use  . Vaping Use: Never used  Substance and Sexual Activity  . Alcohol use: No  . Drug use: No  . Sexual activity: Not Currently    Birth control/protection: None  Other Topics Concern  . Not on file  Social History Narrative   Lives alone   [place] to [place] 4 months ago to be closer to family   Previously lived in [place] , more active   Very close to sister [place]   Social Determinants of Health   Financial Resource Strain:   . Difficulty of Paying Living Expenses: Not on file  Food Insecurity:   . Worried About Charity fundraiser in the Last Year: Not on file  . Ran Out of Food in the Last Year: Not on file  Transportation Needs:   . Lack of Transportation (Medical): Not on file  . Lack of Transportation (Non-Medical): Not on file  Physical Activity:   . Days of Exercise per Week: Not on file  . Minutes of Exercise per Session: Not on file  Stress:   . Feeling of Stress : Not on file  Social Connections:   . Frequency of Communication with Friends and Family: Not on file  . Frequency of  Social Gatherings with Friends and Family: Not on file  . Attends Religious Services: Not on file  . Active Member of Clubs or Organizations: Not on file  . Attends Archivist Meetings: Not on file  . Marital Status: Not on file  Intimate Partner Violence:   . Fear of Current or Ex-Partner: Not on file  . Emotionally Abused:  Not on file  . Physically Abused: Not on file  . Sexually Abused: Not on file     Family History  Problem Relation Age of Onset  . Asthma Sister   . Hypertension Mother   . Heart attack Mother 72       died at 24  . COPD Maternal Uncle        prostate  . Mental illness Neg Hx       Review of Systems: All other systems reviewed and are otherwise negative except as noted above.  Physical Exam: Vitals:   09/20/20 2204 09/20/20 2347 09/21/20 0337 09/21/20 0941  BP: (!) 156/95 (!) 130/99 113/68 115/86  Pulse: (!) 108 94 72 64  Resp: _0 Temp:  98 F (36.7 C) 97.6 F (36.4 C) 97.6 F (36.4 C)  TempSrc:  Oral Oral Oral  SpO2:  97% 100% 100%  Weight:      Height:        GEN- The patient is well appearing, alert and oriented x 3 today.   Head- normocephalic, atraumatic Eyes-  Sclera clear, conjunctiva pink Ears- hearing intact Oropharynx- clear Neck- supple Lungs- Clear to ausculation bilaterally, normal work of breathing Heart- Regular rate and rhythm, no murmurs, rubs or gallops  GI- soft, NT, ND, + BS Extremities- no clubbing, cyanosis, or edema MS- no significant deformity or atrophy Skin- no rash or lesion Psych- euthymic mood, full affect   Labs:   Lab Results  Component Value Date   WBC 10.3 09/20/2020   HGB 10.3 (L) 09/20/2020   HCT 32.7 (L) 09/20/2020   MCV 90.1 09/20/2020   PLT 75 (L) 09/20/2020    Recent Labs  Lab 09/20/20 0511  NA 133*  K 5.2*  CL 96*  CO2 22  BUN 71*  CREATININE 9.01*  CALCIUM 8.0*  PROT 5.3*  BILITOT 0.5  ALKPHOS 100  ALT 40  AST 16  GLUCOSE 156*     Radiology/Studies:  CT ANGIO HEAD W OR WO CONTRAST  Result Date: 09/18/2020 CLINICAL DATA:  Small cerebellar stroke on MRI EXAM: CT ANGIOGRAPHY HEAD AND NECK TECHNIQUE: Multidetector CT imaging of the head and neck was performed using the standard protocol during bolus administration of intravenous contrast. Multiplanar CT image reconstructions and MIPs were obtained to evaluate the vascular anatomy. Carotid stenosis measurements (when applicable) are obtained utilizing NASCET criteria, using the distal internal carotid diameter as the denominator. CONTRAST:  37m OMNIPAQUE IOHEXOL 350 MG/ML SOLN COMPARISON:  None. FINDINGS: CT HEAD Brain: There is a small area hypoattenuation corresponding to area of midline inferior cerebellar infarction on prior MRI. There is no acute intracranial hemorrhage or mass effect. No acute appearing loss of gray-white differentiation. Chronic left parietal infarct. There is no extra-axial fluid collection. Ventricles and sulci are within normal limits in size and configuration. Vascular: Better evaluated on CTA portion. Skull: Calvarium is unremarkable. Sinuses/Orbits: Bilateral proptosis.  No acute finding. Other: None. Review of the MIP images confirms the above findings CTA NECK Aortic arch: Great vessel origins are patent. There is direct origin of the left vertebral artery from the arch. Right carotid system: Patent. Minimal calcified plaque at the ICA origin without measurable stenosis. Left carotid system: Patent. Mild calcified plaque at the ICA origin without measurable stenosis. Vertebral arteries: Patent.  Left vertebral artery is dominant. Skeleton: No significant abnormality. Other neck: No mass or adenopathy. Upper chest: No apical lung mass. Right IJ approach catheter enters the SVC. Review of  the MIP images confirms the above findings CTA HEAD Anterior circulation: Intracranial internal carotid arteries are patent with calcified plaque. Moderate stenosis of the supraclinoid portions  bilaterally. Anterior and middle cerebral arteries are patent. Posterior circulation: Intracranial vertebral arteries are patent. Right vertebral artery becomes diminutive after PICA origin. Both PICA origins are patent. Basilar artery is patent. Superior cerebellar artery origins are patent. Posterior cerebral arteries are patent. Bilateral posterior communicating arteries are present with fetal origin of the left posterior cerebral artery. Venous sinuses: Patent as allowed by contrast bolus timing. Review of the MIP images confirms the above findings IMPRESSION: Small acute midline cerebellar infarct as seen on MRI. No new intracranial abnormality. No hemodynamically significant stenosis in the neck. No proximal intracranial vessel occlusion. 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 Electronically Signed   By: Macy Mis M.D.   On: 09/18/2020 08:01   CT ANGIO NECK W OR WO CONTRAST  Result Date: 09/18/2020 CLINICAL DATA:  Small cerebellar stroke on MRI EXAM: CT ANGIOGRAPHY HEAD AND NECK TECHNIQUE: Multidetector CT imaging of the head and neck was performed using the standard protocol during bolus administration of intravenous contrast. Multiplanar CT image reconstructions and MIPs were obtained to evaluate the vascular anatomy. Carotid stenosis measurements (when applicable) are obtained utilizing NASCET criteria, using the distal internal carotid diameter as the denominator. CONTRAST:  41m OMNIPAQUE IOHEXOL 350 MG/ML SOLN COMPARISON:  None. FINDINGS: CT HEAD Brain: There is a small area hypoattenuation corresponding to area of midline inferior cerebellar infarction on prior MRI. There is no acute intracranial hemorrhage or mass effect. No acute appearing loss of gray-white differentiation. Chronic left parietal infarct. There is no extra-axial fluid collection. Ventricles and sulci are within normal limits in size and configuration. Vascular: Better evaluated on CTA  portion. Skull: Calvarium is unremarkable. Sinuses/Orbits: Bilateral proptosis.  No acute finding. Other: None. Review of the MIP images confirms the above findings CTA NECK Aortic arch: Great vessel origins are patent. There is direct origin of the left vertebral artery from the arch. Right carotid system: Patent. Minimal calcified plaque at the ICA origin without measurable stenosis. Left carotid system: Patent. Mild calcified plaque at the ICA origin without measurable stenosis. Vertebral arteries: Patent.  Left vertebral artery is dominant. Skeleton: No significant abnormality. Other neck: No mass or adenopathy. Upper chest: No apical lung mass. Right IJ approach catheter enters the SVC. Review of the MIP images confirms the above findings CTA HEAD Anterior circulation: Intracranial internal carotid arteries are patent with calcified plaque. Moderate stenosis of the supraclinoid portions bilaterally. Anterior and middle cerebral arteries are patent. Posterior circulation: Intracranial vertebral arteries are patent. Right vertebral artery becomes diminutive after PICA origin. Both PICA origins are patent. Basilar artery is patent. Superior cerebellar artery origins are patent. Posterior cerebral arteries are patent. Bilateral posterior communicating arteries are present with fetal origin of the left posterior cerebral artery. Venous sinuses: Patent as allowed by contrast bolus timing. Review of the MIP images confirms the above findings IMPRESSION: Small acute midline cerebellar infarct as seen on MRI. No new intracranial abnormality. No hemodynamically significant stenosis in the neck. No proximal intracranial vessel occlusion. 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 Electronically Signed   By: PMacy MisM.D.   On: 09/18/2020 08:01   MR BRAIN WO CONTRAST  Result Date: 09/17/2020 CLINICAL DATA:  Dizziness EXAM: MRI HEAD WITHOUT CONTRAST TECHNIQUE: Multiplanar,  multiecho pulse sequences of the brain and surrounding structures were obtained without intravenous contrast.  COMPARISON:  None. FINDINGS: Motion artifact is present. Brain: There is an 8 mm focus of mildly reduced diffusion involving the midline inferior vermis. Chronic left parietal infarct with chronic blood products. There is no intracranial mass, mass effect, or edema. There is no hydrocephalus or extra-axial fluid collection. Ventricles and sulci are normal in size and configuration. Vascular: Major vessel flow voids at the skull base are preserved. Skull and upper cervical spine: Normal marrow signal is preserved. Sinuses/Orbits: Paranasal sinuses are aerated. Orbits are unremarkable. Other: Sella is unremarkable.  Mastoid air cells are clear. IMPRESSION: Motion degraded. Subcentimeter acute infarct involving the midline inferior vermis. Chronic left parietal infarct. Electronically Signed   By: Macy Mis M.D.   On: 09/17/2020 13:59   VAS US DUPLEX DIALYSIS ACCESS (AVF,AVG)  Result Date: 09/09/2020 DIALYSIS ACCESS Reason for Exam: Bruising on right fistula site, arm swelling. Access Site: Right Upper Extremity. Access Type: 2nd stage basilic vein transposition 07/01/2020. Performing Technologist: Ronal Fear RVS, RCS  Examination Guidelines: A complete evaluation includes B-mode imaging, spectral Doppler, color Doppler, and power Doppler as needed of all accessible portions of each vessel. Unilateral testing is considered an integral part of a complete examination. Limited examinations for reoccurring indications may be performed as noted.  Findings: +--------------------+----------+-----------------+--------+ AVF                 PSV (cm/s)Flow Vol (mL/min)Comments +--------------------+----------+-----------------+--------+ Native artery inflow   182           669                +--------------------+----------+-----------------+--------+ AVF Anastomosis        285                               +--------------------+----------+-----------------+--------+  +------------+----------+-------------+----------+-----------------------------+ OUTFLOW VEINPSV (cm/s)Diameter (cm)Depth (cm)          Describe            +------------+----------+-------------+----------+-----------------------------+ Shoulder       381        0.77        0.75                                 +------------+----------+-------------+----------+-----------------------------+ Prox UA         95        0.70        0.18   change in diameter to 0.39 cm                                                       & 475 cm/s           +------------+----------+-------------+----------+-----------------------------+ Mid UA         103        1.17        0.16        partially-occlusive      +------------+----------+-------------+----------+-----------------------------+ Dist UA        578        0.70        0.31                                 +------------+----------+-------------+----------+-----------------------------+  AC Fossa       422        0.55        0.81        partially-occlusive      +------------+----------+-------------+----------+-----------------------------+   Summary: Patent arteriovenous fistula with an area of dilatation and mild thrombus involving the mid segment of the basilic outflow vein. Mild thrombus at the anastomosis. Diameter change in the shoulder/proximal upper arm segment of the basilic outflow vein with increased velocity of 475 cm/s. *See table(s) above for measurements and observations.  Diagnosing physician: Servando Snare MD Electronically signed by Servando Snare MD on 09/09/2020 at 2:32:48 PM.    --------------------------------------------------------------------------------   Final    ECHOCARDIOGRAM COMPLETE  Result Date: 09/18/2020    ECHOCARDIOGRAM REPORT   Patient Name:   MEIGHAN TRETO Date of Exam: 09/18/2020 Medical Rec #:  [id number]          Height:        64.0 in Accession #:    [id number]         Weight:       135.6 lb Date of Birth:  [birth year]           BSA:          1.658 m Patient Age:    89 years           BP:           134/97 mmHg Patient Gender: F                  HR:           87 bpm. Exam Location:  Inpatient Procedure: 2D Echo, Cardiac Doppler and Color Doppler Indications:    Stroke  History:        Patient has prior history of Echocardiogram examinations, most                 recent 12/18/2017. Mitral Valve Disease; Risk                 Factors:Hypertension. ESRD. Lupus.  Sonographer:    Clayton Lefort RDCS (AE) Referring Phys: [id number] [place]  1. Left ventricular ejection fraction, by estimation, is 55 to 60%. The left ventricle has normal function. The left ventricle has no regional wall motion abnormalities. There is severe concentric left ventricular hypertrophy. Diastolic function indeterminant due to moderate-to-severe MAC.  2. Right ventricular systolic function is mildly reduced. The right ventricular size is normal. There is moderately elevated pulmonary artery systolic pressure.  3. Left atrial size was severely dilated.  4. Right atrial size was mildly dilated.  5. The mitral valve is degenerative. Moderate mitral valve regurgitation. Severe mitral stenosis with mean gradient 31mHg and MVA of 0.73cm2 by continuity equation at HR 88bpm. Moderate to severe mitral annular calcification.  6. Tricuspid valve regurgitation is severe.  7. The aortic valve is tricuspid. There is mild calcification of the aortic valve. There is mild thickening of the aortic valve. Aortic valve regurgitation is mild. Mild to moderate aortic valve sclerosis/calcification is present, without any evidence of aortic stenosis.  8. The inferior vena cava is normal in size with <50% respiratory variability, suggesting right atrial pressure of 8 mmHg. Comparison(s): Compared to prior study on 11/2017, there is now severe calcific mitral stenosis and severe  tricuspid regurgitation. FINDINGS  Left Ventricle: Left ventricular ejection fraction, by estimation, is 55 to 60%. The left ventricle has normal function. The left ventricle has no  regional wall motion abnormalities. The left ventricular internal cavity size was normal in size. There is  severe concentric left ventricular hypertrophy. Indeterminant due to moderate-to-severe MAC. Right Ventricle: The right ventricular size is normal. Right vetricular wall thickness was not well visualized. Right ventricular systolic function is mildly reduced. There is moderately elevated pulmonary artery systolic pressure. The tricuspid regurgitant velocity is 3.51 m/s, and with an assumed right atrial pressure of 8 mmHg, the estimated right ventricular systolic pressure is 94.7 mmHg. Left Atrium: Left atrial size was severely dilated. Right Atrium: Right atrial size was mildly dilated. Pericardium: There is no evidence of pericardial effusion. Mitral Valve: The mitral valve is degenerative in appearance. There is moderate thickening of the mitral valve leaflet(s). There is moderate calcification of the mitral valve leaflet(s). Moderate to severe mitral annular calcification. Moderate mitral valve regurgitation. Severe mitral stenosis with mean gradient 76mHg and MVA of 0.73cm2 by continuity equation. Tricuspid Valve: The tricuspid valve is normal in structure. Tricuspid valve regurgitation is severe. No evidence of tricuspid stenosis. Aortic Valve: The aortic valve is tricuspid. There is mild calcification of the aortic valve. There is mild thickening of the aortic valve. Aortic valve regurgitation is mild. Aortic regurgitation PHT measures 816 msec. Mild to moderate aortic valve sclerosis/calcification is present, without any evidence of aortic stenosis. Aortic valve mean gradient measures 4.0 mmHg. Aortic valve peak gradient measures 8.1 mmHg. Aortic valve area, by VTI measures 2.21 cm. Pulmonic Valve: The pulmonic valve was  normal in structure. Pulmonic valve regurgitation is not visualized. No evidence of pulmonic stenosis. Aorta: The aortic root and ascending aorta are structurally normal, with no evidence of dilitation. Venous: The inferior vena cava is normal in size with less than 50% respiratory variability, suggesting right atrial pressure of 8 mmHg. IAS/Shunts: No atrial level shunt detected by color flow Doppler.  LEFT VENTRICLE PLAX 2D LVIDd:         2.50 cm  Diastology LVIDs:         1.80 cm  LV e' medial:    5.25 cm/s LV PW:         1.80 cm  LV E/e' medial:  36.8 LV IVS:        2.00 cm  LV e' lateral:   3.98 cm/s LVOT diam:     1.90 cm  LV E/e' lateral: 48.5 LV SV:         54 LV SV Index:   32 LVOT Area:     2.84 cm  RIGHT VENTRICLE             IVC RV Basal diam:  3.50 cm     IVC diam: 1.70 cm RV S prime:     10.30 cm/s TAPSE (M-mode): 1.7 cm LEFT ATRIUM             Index       RIGHT ATRIUM           Index LA diam:        3.80 cm 2.29 cm/m  RA Area:     16.80 cm LA Vol (A2C):   77.5 ml 46.73 ml/m RA Volume:   46.20 ml  27.86 ml/m LA Vol (A4C):   71.0 ml 42.81 ml/m LA Biplane Vol: 74.8 ml 45.10 ml/m  AORTIC VALVE AV Area (Vmax):    2.40 cm AV Area (Vmean):   2.45 cm AV Area (VTI):     2.21 cm AV Vmax:  142.00 cm/s AV Vmean:          99.000 cm/s AV VTI:            0.244 m AV Peak Grad:      8.1 mmHg AV Mean Grad:      4.0 mmHg LVOT Vmax:         120.00 cm/s LVOT Vmean:        85.400 cm/s LVOT VTI:          0.190 m LVOT/AV VTI ratio: 0.78 AI PHT:            816 msec  AORTA Ao Root diam: 2.90 cm Ao Asc diam:  3.00 cm MITRAL VALVE                 TRICUSPID VALVE MV Area (PHT): 1.98 cm      TR Peak grad:   49.3 mmHg MV Peak grad:  26.8 mmHg     TR Vmax:        351.00 cm/s MV Mean grad:  16.0 mmHg MV Vmax:       2.59 m/s      SHUNTS MV Vmean:      188.0 cm/s    Systemic VTI:  0.19 m MV Decel Time: 383 msec      Systemic Diam: 1.90 cm MR Peak grad:    120.6 mmHg MR Mean grad:    84.0 mmHg MR Vmax:         549.00  cm/s MR Vmean:        437.0 cm/s MR PISA:         2.26 cm MR PISA Eff ROA: 16 mm MR PISA Radius:  0.60 cm MV E velocity: 193.00 cm/s MV A velocity: 221.00 cm/s MV E/A ratio:  0.87 Gwyndolyn Kaufman MD Electronically signed by Gwyndolyn Kaufman MD Signature Date/Time: 09/18/2020/1:21:39 PM    Final    ECHO TEE  Result Date: 09/20/2020    TRANSESOPHOGEAL ECHO REPORT   Patient Name:   MAYLEEN BORRERO Date of Exam: 09/20/2020 Medical Rec #:  [id number]          Height:       64.0 in Accession #:    [id number]         Weight:       127.4 lb Date of Birth:  June 13, 1976           BSA:          1.615 m Patient Age:    68 years           BP:           180/95 mmHg Patient Gender: F                  HR:           107 bpm. Exam Location:  Inpatient Procedure: Transesophageal Echo, 3D Echo, Color Doppler, Cardiac Doppler and            Saline Contrast Bubble Study Indications:     Stroke  History:         Patient has prior history of Echocardiogram examinations, most                  recent 09/18/2020. Risk Factors:Hypertension, Dyslipidemia and                  Lupus.  Sonographer:     Raquel Sarna Senior RDCS Referring Phys:  [place] Diagnosing  Phys: Mertie Moores MD PROCEDURE: After discussion of the risks and benefits of a TEE, an informed consent was obtained from the patient. The transesophogeal probe was passed without difficulty through the esophogus of the patient. Local oropharyngeal anesthetic was provided with Cetacaine. Sedation performed by different physician. The patient was monitored while under deep sedation. Anesthestetic sedation was provided intravenously by Anesthesiology: 171m of Propofol, 442mof Lidocaine. The patient developed no complications during the procedure. IMPRESSIONS  1. Left ventricular ejection fraction, by estimation, is 55 to 60%. The left ventricle has normal function.  2. Right ventricular systolic function is normal. The right ventricular size is normal.  3. No left  atrial/left atrial appendage thrombus was detected.  4. The mean MS gradient is 14 mmhg.     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 This could be the cause of her stroke.     3-D images of the mitral valve were obtained. . The mitral valve is abnormal. Moderate to severe mitral valve regurgitation. Moderate to severe mitral stenosis. Moderate to severe mitral annular calcification.  5. Tricuspid valve regurgitation is moderate to severe.  6. The aortic valve is grossly normal. Aortic valve regurgitation is mild to moderate. FINDINGS  Left Ventricle: Left ventricular ejection fraction, by estimation, is 55 to 60%. The left ventricle has normal function. The left ventricular internal cavity size was normal in size. Right Ventricle: The right ventricular size is normal. No increase in right ventricular wall thickness. Right ventricular systolic function is normal. Left Atrium: Left atrial size was not well visualized. No left atrial/left atrial appendage thrombus was detected. Right Atrium: Right atrial size was not well visualized. Pericardium: There is no evidence of pericardial effusion. Mitral Valve: The mean MS gradient is 14 mmhg. 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 This could be the cause of her stroke. 3-D images of the mitral valve were obtained. The mitral valve is abnormal. There is severe thickening of the mitral valve leaflet(s). There is severe calcification of the mitral valve leaflet(s). Moderate to severe mitral annular calcification. Moderate  to severe mitral valve regurgitation. Moderate to severe mitral valve stenosis. MV peak gradient, 22.1 mmHg. The mean mitral valve gradient is 14.0 mmHg. Pulmonary venous flow is normal. Tricuspid Valve: The tricuspid valve is grossly normal. Tricuspid valve  regurgitation is moderate to severe. Aortic Valve: The aortic valve is grossly normal. Aortic valve regurgitation is mild to moderate. Pulmonic Valve: The pulmonic valve was grossly normal. Pulmonic valve regurgitation is not visualized. Aorta: The aortic root and ascending aorta are structurally normal, with no evidence of dilitation. IAS/Shunts: No atrial level shunt detected by color flow Doppler. Agitated saline contrast was given intravenously to evaluate for intracardiac shunting. With bubble contrast, there were bubbles that appeared in the LA aproximately 8-9 cardiac cycles after injection. These are most likely due to a transpulmonary AVM or shunt. This is not c/w an ASD or PFO.  MITRAL VALVE MV Peak grad: 22.1 mmHg MV Mean grad: 14.0 mmHg MV Vmax:      2.35 m/s MV Vmean:     178.0 cm/s PhMertie MooresD Electronically signed by PhMertie MooresD Signature Date/Time: 09/20/2020/5:51:40 PM    Final    VAS [place] EXTREMITY VENOUS (DVT)  Result Date: 09/18/2020  Lower Venous DVT Study Indications: Stroke, and  Lupus.  Comparison Study: No prior study Performing Technologist: Sharion Dove RVS  Examination Guidelines: A complete evaluation includes B-mode imaging, spectral Doppler, color Doppler, and power Doppler as needed of all accessible portions of each vessel. Bilateral testing is considered an integral part of a complete examination. Limited examinations for reoccurring indications may be performed as noted. The reflux portion of the exam is performed with the patient in reverse Trendelenburg.  +---------+---------------+---------+-----------+----------+--------------+ RIGHT    CompressibilityPhasicitySpontaneityPropertiesThrombus Aging +---------+---------------+---------+-----------+----------+--------------+ CFV      Full           Yes      Yes                                 +---------+---------------+---------+-----------+----------+--------------+ SFJ      Full                                                         +---------+---------------+---------+-----------+----------+--------------+ FV Prox  Full                                                        +---------+---------------+---------+-----------+----------+--------------+ FV Mid   Full                                                        +---------+---------------+---------+-----------+----------+--------------+ FV DistalFull                                                        +---------+---------------+---------+-----------+----------+--------------+ PFV      Full                                                        +---------+---------------+---------+-----------+----------+--------------+ POP      Full           Yes      Yes                                 +---------+---------------+---------+-----------+----------+--------------+ PTV      Full                                                        +---------+---------------+---------+-----------+----------+--------------+ PERO     Full                                                        +---------+---------------+---------+-----------+----------+--------------+   +---------+---------------+---------+-----------+----------+--------------+  LEFT     CompressibilityPhasicitySpontaneityPropertiesThrombus Aging +---------+---------------+---------+-----------+----------+--------------+ CFV      Full           Yes      Yes                                 +---------+---------------+---------+-----------+----------+--------------+ SFJ      Full                                                        +---------+---------------+---------+-----------+----------+--------------+ FV Prox  Full                                                        +---------+---------------+---------+-----------+----------+--------------+ FV Mid   Full                                                         +---------+---------------+---------+-----------+----------+--------------+ FV DistalFull                                                        +---------+---------------+---------+-----------+----------+--------------+ PFV      Full                                                        +---------+---------------+---------+-----------+----------+--------------+ POP      Full           Yes      Yes                                 +---------+---------------+---------+-----------+----------+--------------+ PTV      Full                                                        +---------+---------------+---------+-----------+----------+--------------+ PERO     Full                                                        +---------+---------------+---------+-----------+----------+--------------+     Summary: BILATERAL: - No evidence of deep vein thrombosis seen in the lower extremities, bilaterally. -   *See table(s) above for measurements and observations. Electronically signed by [name] MD on 09/18/2020 at 2:08:18 PM.    Final  12-lead ECG 09/19/2020 with NSR at 81 (personally reviewed) All prior EKG's in EPIC reviewed with no documented atrial fibrillation  Telemetry NSR/ ST 70-100s (personally reviewed)  Assessment and Plan:  1. 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  The patient has previously undergone TEE as above. I spoke at length with the patient about monitoring for afib with an implantable loop recorder, external event monitor, and anticoagulation. Discussed case with Dr. Rayann Heman.  With severe MS, LAE, HTN, and h/o stroke she has a very high risk of atrial fibrillation and recurrent stroke. Will discuss further with Dr. Burt Knack and Dr. Erlinda Hong.  Platelets 106 09/21/2020. Had been running in 59s. We discussed the risks and benefits for ILR implantation including bleeding and infection. She verbalizes understanding and agrees to  proceed pending MD discussions.  2. ESRD on HD T/Th/Sat Per primary/Nephrology  3. Lupus Stable.   4. Palpitations Previous monitoring negative for arrhyhtmia. + rare PACs  5. HTN Normo to Hypertensive on diltiazem and toprol given severe MS.  Will leave further management to neuro given recent stroke.   Wound care and restrictions reviewed with patient and will be placed on chart and AVS pending disposition.   Shirley Friar, PA-C 09/21/2020 10:42 AM  I have seen, examined the patient, and reviewed the above assessment and plan.  Changes to above are made where necessary.  On exam, RRR.  The patient is chronically ill appearing.  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  She has a h/o palpitations and almost certainly has afib. I worry about atrial stasis/ slow flow with her severe MS and atrial enlargement.  I have spoken with both Drs Burt Knack and Erlinda Hong.  I suspect the appropriate approach would be to initiate oral anticoagulation therapy rather than antiplatelet therapy.  Unfortunately, she is at risk for bleeding given renal failure and thrombocytopenia.  I will defer to neurology, cardiology and primary team.  I do think that it would be beneficial to proceed with ILR to evaluate for afib. Risks and benefits to long term monitoring with an implanted loop recorder were discussed in detail with the patient today who wishes to proceed. We will therefore proceed with ILR at this time.   Co Sign: Thompson Grayer, MD 09/22/2020 2:34 PM

## 2020-09-21 NOTE — Consult Note (Addendum)
ELECTROPHYSIOLOGY CONSULT NOTE  Patient ID: [name] MRN: [id number], DOB/AGE: May 31, 1976   Admit date: 09/17/2020 Date of Consult: 09/21/2020  Primary Physician: Leeanne Rio, MD Primary Cardiologist: Kate Sable, MD (Inactive)  Primary Electrophysiologist: New to Dr. Rayann Heman Reason for Consultation: Cryptogenic stroke; recommendations regarding Implantable Loop Recorder Insurance: [employer]  History of Present Illness [name] is a 44 y.o. female with h/o lupus, ESRD (T/Th/Sa), HTN, secondary hyperparathyroidism, depression, chronic tremor, extremity edema and anxiety.   EP has been asked to evaluate [name] for placement of an implantable loop recorder to monitor for atrial fibrillation by Dr Erlinda Hong.  The patient was admitted on 09/17/2020 with room-spinning dizziness, N/V, and stool incontinence. IV t-PA not given due to late presentation (12 hours of symptoms)   They have undergone workup for stroke including echocardiogram, TEE, and below: Imaging demonstrated: MRI head 10/30- Subcentimeter acute infarct involving the midline inferior vermis. Chronic left parietal infarct.  CTA H&N 10/31- Moderate stenosis of the supraclinoid internal carotid arteries bilaterally.  2D Echo 10/31- EF 55 to 60%, severe left atrial dilatation, severe mitral stenosis LE venous Doppler 10/31 negative for DVT TEE 11/2 no endocarditis or thrombus, severe MS, TR, no PFO or ASD, late bubbles c/w pulm shunt or AVM  The patient has been monitored on telemetry which has demonstrated sinus rhythm with no arrhythmias.    Dr. Burt Knack was also consulted for her MS and recommended monitoring for AF with loop.  She was previously seen by Dr. Bronson Ing in 2019 for palpitations. 21 day monitor in Feb 2019 was negative for AF or arryhtmia. Showed NSR with rare PACs  Prior to admission, the patient denies chest pain, shortness of breath, dizziness, palpitations, or syncope.  They are  recovering from their stroke with plans to attend CIR  at discharge.  Past Medical History:  Diagnosis Date  . Allergy   . Anemia   . Anxiety   . Arthritis   . Avascular necrosis of bone (HCC)    left tibial talus due to chronic prednisone use  . Cerebellar stroke ([place])   . Depression   . Dyspnea    too much  fluid  . ESRD (end stage renal disease) on dialysis ([place])    tthsat [place]  . GERD (gastroesophageal reflux disease)   . Headache(784.0)   . History of high blood pressure   . Hyperlipemia   . Hypertension   . Lupus ([place])   . Pneumonia   . Secondary hyperparathyroidism (of renal origin)      Surgical History:  Past Surgical History:  Procedure Laterality Date  . A/V FISTULAGRAM Left 12/02/2017   Procedure: A/V FISTULAGRAM - Left upper;  Surgeon: Angelia Mould, MD;  Location: [place] [facility];  Service: Cardiovascular;  Laterality: Left;  . AV FISTULA PLACEMENT Left   . AV FISTULA PLACEMENT Right 01/26/2014   Procedure: ARTERIOVENOUS (AV) FISTULA CREATION; ultrasound guided;  Surgeon: Conrad Pecos, MD;  Location: [place];  Service: Vascular;  Laterality: Right;  . AV FISTULA PLACEMENT Right 04/25/2020   Procedure: RIGHT UPPER ARM  BASILIC VEIN ARTERIOVENOUS (AV) FISTULA;  Surgeon: [name], MD;  Location: [place];  Service: Vascular;  Laterality: Right;  . BASCILIC VEIN TRANSPOSITION Right 07/01/2020   Procedure: SECOND STAGE BASCILIC VEIN TRANSPOSITION RIGHT UPPER EXTREMITY;  Surgeon: [name], MD;  Location: [facility] OR;  Service: Vascular;  Laterality: Right;  . ESOPHAGOGASTRODUODENOSCOPY N/A 03/17/2016   Procedure: ESOPHAGOGASTRODUODENOSCOPY (  EGD);  Surgeon: Irene Shipper, MD;  Location: [place];  Service: Endoscopy;  Laterality: N/A;  . FISTULOGRAM Left 09/24/2019   Procedure: FISTULOGRAM LEFT ARM;  Surgeon: [name], MD;  Location: [place];  Service: Vascular;  Laterality: Left;  . HEMATOMA EVACUATION Left 10/09/2013    Procedure: EVACUATION HEMATOMA;  Surgeon: Angelia Mould, MD;  Location: [place];  Service: Vascular;  Laterality: Left;  . HEMATOMA EVACUATION Left 09/24/2019   Procedure: EVACUATION HEMATOMA  LEFT ARM;  Surgeon: [name], MD;  Location: [place];  Service: Vascular;  Laterality: Left;  . INCISION AND DRAINAGE ABSCESS     gluteal  . INSERTION OF DIALYSIS CATHETER N/A 10/09/2013   Procedure: INSERTION OF DIALYSIS CATHETER; ULTRASOUND GUIDED;  Surgeon: Angelia Mould, MD;  Location: [place];  Service: Vascular;  Laterality: N/A;  . INSERTION OF DIALYSIS CATHETER Left 09/24/2019   Procedure: INSERTION OF TUNNELLED DIALYSIS CATHETER - PALINDROME 15FR X 28CM;  Surgeon: [name], MD;  Location: [place];  Service: Vascular;  Laterality: Left;  . INSERTION OF DIALYSIS CATHETER Right 03/15/2020   Procedure: Insertion Of Dialysis Catheter;  Surgeon: Angelia Mould, MD;  Location: [place];  Service: Cardiovascular;  Laterality: Right;  . PERIPHERAL VASCULAR BALLOON ANGIOPLASTY Left 12/02/2017   Procedure: PERIPHERAL VASCULAR BALLOON ANGIOPLASTY;  Surgeon: Angelia Mould, MD;  Location: [place] [facility];  Service: Cardiovascular;  Laterality: Left;  . REVISION OF ARTERIOVENOUS GORETEX GRAFT Left 04/03/2019   Procedure: REVISION OF ARTERIOVENOUS GORETEX GRAFT PSEUDOANEURYSM;  Surgeon: Angelia Mould, MD;  Location: [place];  Service: Vascular;  Laterality: Left;  . SHUNTOGRAM N/A 01/07/2014   Procedure: fistulogram with possibe venoplasty left upper arm avg;  Surgeon: Angelia Mould, MD;  Location: [facility];  Service: Cardiovascular;  Laterality: N/A;  . THROMBECTOMY AND REVISION OF ARTERIOVENTOUS (AV) GORETEX  GRAFT Left 03/15/2020   Procedure: ATTEMPTED THROMBECTOMY OF LEFT ARM ARTERIOVENTOUS (AV) GORETEX  GRAFT;  Surgeon: Angelia Mould, MD;  Location: [place];  Service: Cardiovascular;  Laterality: Left;  . tibiocalcaneal fusion  left Left   .  ULTRASOUND GUIDANCE FOR VASCULAR ACCESS  09/24/2019   Procedure: Ultrasound Guidance For Vascular Access;  Surgeon: [name], MD;  Location: [place];  Service: Vascular;;     Medications Prior to Admission  Medication Sig Dispense Refill Last Dose  . acetaminophen (TYLENOL) 500 MG tablet Take 1,000 mg by mouth every 6 (six) hours as needed for moderate pain or headache.    month ago  . b complex-vitamin c-folic acid (NEPHRO-VITE) 0.8 MG TABS tablet Take 1 tablet by mouth daily at 12 noon.   09/16/2020  . calcium acetate, Phos Binder, (PHOSLYRA) 667 MG/5ML SOLN Take 2,001 mg by mouth See admin instructions. Take 15 mls (2001 mg) by mouth with each meal and snack - up to 9 times daily   09/16/2020 at pm  . Carboxymethylcellul-Glycerin (LUBRICATING EYE DROPS OP) Place 1 drop into both eyes daily as needed (dry eyes).   09/16/2020  . diltiazem (TIAZAC) 240 MG 24 hr capsule Take 240 mg by mouth at bedtime.    09/16/2020  . hydrALAZINE (APRESOLINE) 25 MG tablet Take 25 mg by mouth in the morning and at bedtime.   09/16/2020  . pantoprazole (PROTONIX) 40 MG tablet Take 1 tablet (40 mg total) by mouth daily. 30 tablet 3 09/16/2020 at am  . PARoxetine (PAXIL) 40 MG tablet Take 40 mg by mouth daily.  09/16/2020 at am  . predniSONE (DELTASONE) 10 MG tablet Take 1 tablet (10 mg total) by mouth daily with breakfast. 90 tablet 3 09/16/2020 at am  . traZODone (DESYREL) 100 MG tablet Take 100 mg by mouth at bedtime.   09/16/2020 at pm  . clopidogrel (PLAVIX) 75 MG tablet Take 75 mg by mouth daily.   not yet  . HYDROcodone-acetaminophen (NORCO/VICODIN) 5-325 MG tablet Take 1 tablet by mouth every 6 (six) hours as needed for moderate pain. (Patient not taking: Reported on 09/14/2020) 12 tablet 0 Not Taking at Unknown time  . meclizine (ANTIVERT) 25 MG tablet Take 25 mg by mouth 3 (three) times daily as needed.   not yet    Inpatient Medications:  . aspirin EC  325 mg Oral Daily  . atorvastatin  40 mg  Oral Daily  . calcium acetate (Phos Binder)  1,334 mg Oral TID WC  . Chlorhexidine Gluconate Cloth  6 each Topical Daily  . diltiazem  240 mg Oral QHS  . metoprolol tartrate  50 mg Oral BID  . pantoprazole  40 mg Oral Daily  . PARoxetine  40 mg Oral Daily  . predniSONE  10 mg Oral Q breakfast  . traZODone  100 mg Oral QHS    Allergies:  Allergies  Allergen Reactions  . Amlodipine Hives and Swelling  . Sulfa Antibiotics Hives and Itching  . Vancomycin Hives and Itching  . Venlafaxine Other (See Comments)    Emotional    Social History   Socioeconomic History  . Marital status: Single    Spouse name: none  . Number of children: 0  . Years of education: 7  . Highest education level: Some college, no degree  Occupational History  . Occupation: unemployed    Comment: disability  Tobacco Use  . Smoking status: Never Smoker  . Smokeless tobacco: Never Used  Vaping Use  . Vaping Use: Never used  Substance and Sexual Activity  . Alcohol use: No  . Drug use: No  . Sexual activity: Not Currently    Birth control/protection: None  Other Topics Concern  . Not on file  Social History Narrative   Lives alone   [place] to [place] 4 months ago to be closer to family   Previously lived in [place] , more active   Very close to sister [place]   Social Determinants of Health   Financial Resource Strain:   . Difficulty of Paying Living Expenses: Not on file  Food Insecurity:   . Worried About Charity fundraiser in the Last Year: Not on file  . Ran Out of Food in the Last Year: Not on file  Transportation Needs:   . Lack of Transportation (Medical): Not on file  . Lack of Transportation (Non-Medical): Not on file  Physical Activity:   . Days of Exercise per Week: Not on file  . Minutes of Exercise per Session: Not on file  Stress:   . Feeling of Stress : Not on file  Social Connections:   . Frequency of Communication with Friends and Family: Not on file  . Frequency of  Social Gatherings with Friends and Family: Not on file  . Attends Religious Services: Not on file  . Active Member of Clubs or Organizations: Not on file  . Attends Archivist Meetings: Not on file  . Marital Status: Not on file  Intimate Partner Violence:   . Fear of Current or Ex-Partner: Not on file  . Emotionally Abused:  Not on file  . Physically Abused: Not on file  . Sexually Abused: Not on file     Family History  Problem Relation Age of Onset  . Asthma Sister   . Hypertension Mother   . Heart attack Mother 109       died at 63  . COPD Maternal Uncle        prostate  . Mental illness Neg Hx       Review of Systems: All other systems reviewed and are otherwise negative except as noted above.  Physical Exam: Vitals:   09/20/20 2204 09/20/20 2347 09/21/20 0337 09/21/20 0941  BP: (!) 156/95 (!) 130/99 113/68 115/86  Pulse: (!) 108 94 72 64  Resp: _0 Temp:  98 F (36.7 C) 97.6 F (36.4 C) 97.6 F (36.4 C)  TempSrc:  Oral Oral Oral  SpO2:  97% 100% 100%  Weight:      Height:        GEN- The patient is well appearing, alert and oriented x 3 today.   Head- normocephalic, atraumatic Eyes-  Sclera clear, conjunctiva pink Ears- hearing intact Oropharynx- clear Neck- supple Lungs- Clear to ausculation bilaterally, normal work of breathing Heart- Regular rate and rhythm, no murmurs, rubs or gallops  GI- soft, NT, ND, + BS Extremities- no clubbing, cyanosis, or edema MS- no significant deformity or atrophy Skin- no rash or lesion Psych- euthymic mood, full affect   Labs:   Lab Results  Component Value Date   WBC 10.3 09/20/2020   HGB 10.3 (L) 09/20/2020   HCT 32.7 (L) 09/20/2020   MCV 90.1 09/20/2020   PLT 75 (L) 09/20/2020    Recent Labs  Lab 09/20/20 0511  NA 133*  K 5.2*  CL 96*  CO2 22  BUN 71*  CREATININE 9.01*  CALCIUM 8.0*  PROT 5.3*  BILITOT 0.5  ALKPHOS 100  ALT 40  AST 16  GLUCOSE 156*     Radiology/Studies:  CT ANGIO HEAD W OR WO CONTRAST  Result Date: 09/18/2020 CLINICAL DATA:  Small cerebellar stroke on MRI EXAM: CT ANGIOGRAPHY HEAD AND NECK TECHNIQUE: Multidetector CT imaging of the head and neck was performed using the standard protocol during bolus administration of intravenous contrast. Multiplanar CT image reconstructions and MIPs were obtained to evaluate the vascular anatomy. Carotid stenosis measurements (when applicable) are obtained utilizing NASCET criteria, using the distal internal carotid diameter as the denominator. CONTRAST:  44m OMNIPAQUE IOHEXOL 350 MG/ML SOLN COMPARISON:  None. FINDINGS: CT HEAD Brain: There is a small area hypoattenuation corresponding to area of midline inferior cerebellar infarction on prior MRI. There is no acute intracranial hemorrhage or mass effect. No acute appearing loss of gray-white differentiation. Chronic left parietal infarct. There is no extra-axial fluid collection. Ventricles and sulci are within normal limits in size and configuration. Vascular: Better evaluated on CTA portion. Skull: Calvarium is unremarkable. Sinuses/Orbits: Bilateral proptosis.  No acute finding. Other: None. Review of the MIP images confirms the above findings CTA NECK Aortic arch: Great vessel origins are patent. There is direct origin of the left vertebral artery from the arch. Right carotid system: Patent. Minimal calcified plaque at the ICA origin without measurable stenosis. Left carotid system: Patent. Mild calcified plaque at the ICA origin without measurable stenosis. Vertebral arteries: Patent.  Left vertebral artery is dominant. Skeleton: No significant abnormality. Other neck: No mass or adenopathy. Upper chest: No apical lung mass. Right IJ approach catheter enters the SVC. Review of  the MIP images confirms the above findings CTA HEAD Anterior circulation: Intracranial internal carotid arteries are patent with calcified plaque. Moderate stenosis of the supraclinoid portions  bilaterally. Anterior and middle cerebral arteries are patent. Posterior circulation: Intracranial vertebral arteries are patent. Right vertebral artery becomes diminutive after PICA origin. Both PICA origins are patent. Basilar artery is patent. Superior cerebellar artery origins are patent. Posterior cerebral arteries are patent. Bilateral posterior communicating arteries are present with fetal origin of the left posterior cerebral artery. Venous sinuses: Patent as allowed by contrast bolus timing. Review of the MIP images confirms the above findings IMPRESSION: Small acute midline cerebellar infarct as seen on MRI. No new intracranial abnormality. No hemodynamically significant stenosis in the neck. No proximal intracranial vessel occlusion. 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 Electronically Signed   By: Macy Mis M.D.   On: 09/18/2020 08:01   CT ANGIO NECK W OR WO CONTRAST  Result Date: 09/18/2020 CLINICAL DATA:  Small cerebellar stroke on MRI EXAM: CT ANGIOGRAPHY HEAD AND NECK TECHNIQUE: Multidetector CT imaging of the head and neck was performed using the standard protocol during bolus administration of intravenous contrast. Multiplanar CT image reconstructions and MIPs were obtained to evaluate the vascular anatomy. Carotid stenosis measurements (when applicable) are obtained utilizing NASCET criteria, using the distal internal carotid diameter as the denominator. CONTRAST:  41m OMNIPAQUE IOHEXOL 350 MG/ML SOLN COMPARISON:  None. FINDINGS: CT HEAD Brain: There is a small area hypoattenuation corresponding to area of midline inferior cerebellar infarction on prior MRI. There is no acute intracranial hemorrhage or mass effect. No acute appearing loss of gray-white differentiation. Chronic left parietal infarct. There is no extra-axial fluid collection. Ventricles and sulci are within normal limits in size and configuration. Vascular: Better evaluated on CTA  portion. Skull: Calvarium is unremarkable. Sinuses/Orbits: Bilateral proptosis.  No acute finding. Other: None. Review of the MIP images confirms the above findings CTA NECK Aortic arch: Great vessel origins are patent. There is direct origin of the left vertebral artery from the arch. Right carotid system: Patent. Minimal calcified plaque at the ICA origin without measurable stenosis. Left carotid system: Patent. Mild calcified plaque at the ICA origin without measurable stenosis. Vertebral arteries: Patent.  Left vertebral artery is dominant. Skeleton: No significant abnormality. Other neck: No mass or adenopathy. Upper chest: No apical lung mass. Right IJ approach catheter enters the SVC. Review of the MIP images confirms the above findings CTA HEAD Anterior circulation: Intracranial internal carotid arteries are patent with calcified plaque. Moderate stenosis of the supraclinoid portions bilaterally. Anterior and middle cerebral arteries are patent. Posterior circulation: Intracranial vertebral arteries are patent. Right vertebral artery becomes diminutive after PICA origin. Both PICA origins are patent. Basilar artery is patent. Superior cerebellar artery origins are patent. Posterior cerebral arteries are patent. Bilateral posterior communicating arteries are present with fetal origin of the left posterior cerebral artery. Venous sinuses: Patent as allowed by contrast bolus timing. Review of the MIP images confirms the above findings IMPRESSION: Small acute midline cerebellar infarct as seen on MRI. No new intracranial abnormality. No hemodynamically significant stenosis in the neck. No proximal intracranial vessel occlusion. 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 Electronically Signed   By: PMacy MisM.D.   On: 09/18/2020 08:01   MR BRAIN WO CONTRAST  Result Date: 09/17/2020 CLINICAL DATA:  Dizziness EXAM: MRI HEAD WITHOUT CONTRAST TECHNIQUE: Multiplanar,  multiecho pulse sequences of the brain and surrounding structures were obtained without intravenous contrast.  COMPARISON:  None. FINDINGS: Motion artifact is present. Brain: There is an 8 mm focus of mildly reduced diffusion involving the midline inferior vermis. Chronic left parietal infarct with chronic blood products. There is no intracranial mass, mass effect, or edema. There is no hydrocephalus or extra-axial fluid collection. Ventricles and sulci are normal in size and configuration. Vascular: Major vessel flow voids at the skull base are preserved. Skull and upper cervical spine: Normal marrow signal is preserved. Sinuses/Orbits: Paranasal sinuses are aerated. Orbits are unremarkable. Other: Sella is unremarkable.  Mastoid air cells are clear. IMPRESSION: Motion degraded. Subcentimeter acute infarct involving the midline inferior vermis. Chronic left parietal infarct. Electronically Signed   By: Macy Mis M.D.   On: 09/17/2020 13:59   VAS US DUPLEX DIALYSIS ACCESS (AVF,AVG)  Result Date: 09/09/2020 DIALYSIS ACCESS Reason for Exam: Bruising on right fistula site, arm swelling. Access Site: Right Upper Extremity. Access Type: 2nd stage basilic vein transposition 07/01/2020. Performing Technologist: Ronal Fear RVS, RCS  Examination Guidelines: A complete evaluation includes B-mode imaging, spectral Doppler, color Doppler, and power Doppler as needed of all accessible portions of each vessel. Unilateral testing is considered an integral part of a complete examination. Limited examinations for reoccurring indications may be performed as noted.  Findings: +--------------------+----------+-----------------+--------+ AVF                 PSV (cm/s)Flow Vol (mL/min)Comments +--------------------+----------+-----------------+--------+ Native artery inflow   182           669                +--------------------+----------+-----------------+--------+ AVF Anastomosis        285                               +--------------------+----------+-----------------+--------+  +------------+----------+-------------+----------+-----------------------------+ OUTFLOW VEINPSV (cm/s)Diameter (cm)Depth (cm)          Describe            +------------+----------+-------------+----------+-----------------------------+ Shoulder       381        0.77        0.75                                 +------------+----------+-------------+----------+-----------------------------+ Prox UA         95        0.70        0.18   change in diameter to 0.39 cm                                                       & 475 cm/s           +------------+----------+-------------+----------+-----------------------------+ Mid UA         103        1.17        0.16        partially-occlusive      +------------+----------+-------------+----------+-----------------------------+ Dist UA        578        0.70        0.31                                 +------------+----------+-------------+----------+-----------------------------+  AC Fossa       422        0.55        0.81        partially-occlusive      +------------+----------+-------------+----------+-----------------------------+   Summary: Patent arteriovenous fistula with an area of dilatation and mild thrombus involving the mid segment of the basilic outflow vein. Mild thrombus at the anastomosis. Diameter change in the shoulder/proximal upper arm segment of the basilic outflow vein with increased velocity of 475 cm/s. *See table(s) above for measurements and observations.  Diagnosing physician: Servando Snare MD Electronically signed by Servando Snare MD on 09/09/2020 at 2:32:48 PM.    --------------------------------------------------------------------------------   Final    ECHOCARDIOGRAM COMPLETE  Result Date: 09/18/2020    ECHOCARDIOGRAM REPORT   Patient Name:   [name] Date of Exam: 09/18/2020 Medical Rec #:  [id number]          Height:        64.0 in Accession #:    [id number]         Weight:       135.6 lb Date of Birth:  [birth year]           BSA:          1.658 m Patient Age:    89 years           BP:           134/97 mmHg Patient Gender: F                  HR:           87 bpm. Exam Location:  Inpatient Procedure: 2D Echo, Cardiac Doppler and Color Doppler Indications:    Stroke  History:        Patient has prior history of Echocardiogram examinations, most                 recent 12/18/2017. Mitral Valve Disease; Risk                 Factors:Hypertension. ESRD. Lupus.  Sonographer:    Clayton Lefort RDCS (AE) Referring Phys: [id number] [place]  1. Left ventricular ejection fraction, by estimation, is 55 to 60%. The left ventricle has normal function. The left ventricle has no regional wall motion abnormalities. There is severe concentric left ventricular hypertrophy. Diastolic function indeterminant due to moderate-to-severe MAC.  2. Right ventricular systolic function is mildly reduced. The right ventricular size is normal. There is moderately elevated pulmonary artery systolic pressure.  3. Left atrial size was severely dilated.  4. Right atrial size was mildly dilated.  5. The mitral valve is degenerative. Moderate mitral valve regurgitation. Severe mitral stenosis with mean gradient 31mHg and MVA of 0.73cm2 by continuity equation at HR 88bpm. Moderate to severe mitral annular calcification.  6. Tricuspid valve regurgitation is severe.  7. The aortic valve is tricuspid. There is mild calcification of the aortic valve. There is mild thickening of the aortic valve. Aortic valve regurgitation is mild. Mild to moderate aortic valve sclerosis/calcification is present, without any evidence of aortic stenosis.  8. The inferior vena cava is normal in size with <50% respiratory variability, suggesting right atrial pressure of 8 mmHg. Comparison(s): Compared to prior study on 11/2017, there is now severe calcific mitral stenosis and severe  tricuspid regurgitation. FINDINGS  Left Ventricle: Left ventricular ejection fraction, by estimation, is 55 to 60%. The left ventricle has normal function. The left ventricle has no  regional wall motion abnormalities. The left ventricular internal cavity size was normal in size. There is  severe concentric left ventricular hypertrophy. Indeterminant due to moderate-to-severe MAC. Right Ventricle: The right ventricular size is normal. Right vetricular wall thickness was not well visualized. Right ventricular systolic function is mildly reduced. There is moderately elevated pulmonary artery systolic pressure. The tricuspid regurgitant velocity is 3.51 m/s, and with an assumed right atrial pressure of 8 mmHg, the estimated right ventricular systolic pressure is 95.0 mmHg. Left Atrium: Left atrial size was severely dilated. Right Atrium: Right atrial size was mildly dilated. Pericardium: There is no evidence of pericardial effusion. Mitral Valve: The mitral valve is degenerative in appearance. There is moderate thickening of the mitral valve leaflet(s). There is moderate calcification of the mitral valve leaflet(s). Moderate to severe mitral annular calcification. Moderate mitral valve regurgitation. Severe mitral stenosis with mean gradient 69mHg and MVA of 0.73cm2 by continuity equation. Tricuspid Valve: The tricuspid valve is normal in structure. Tricuspid valve regurgitation is severe. No evidence of tricuspid stenosis. Aortic Valve: The aortic valve is tricuspid. There is mild calcification of the aortic valve. There is mild thickening of the aortic valve. Aortic valve regurgitation is mild. Aortic regurgitation PHT measures 816 msec. Mild to moderate aortic valve sclerosis/calcification is present, without any evidence of aortic stenosis. Aortic valve mean gradient measures 4.0 mmHg. Aortic valve peak gradient measures 8.1 mmHg. Aortic valve area, by VTI measures 2.21 cm. Pulmonic Valve: The pulmonic valve was  normal in structure. Pulmonic valve regurgitation is not visualized. No evidence of pulmonic stenosis. Aorta: The aortic root and ascending aorta are structurally normal, with no evidence of dilitation. Venous: The inferior vena cava is normal in size with less than 50% respiratory variability, suggesting right atrial pressure of 8 mmHg. IAS/Shunts: No atrial level shunt detected by color flow Doppler.  LEFT VENTRICLE PLAX 2D LVIDd:         2.50 cm  Diastology LVIDs:         1.80 cm  LV e' medial:    5.25 cm/s LV PW:         1.80 cm  LV E/e' medial:  36.8 LV IVS:        2.00 cm  LV e' lateral:   3.98 cm/s LVOT diam:     1.90 cm  LV E/e' lateral: 48.5 LV SV:         54 LV SV Index:   32 LVOT Area:     2.84 cm  RIGHT VENTRICLE             IVC RV Basal diam:  3.50 cm     IVC diam: 1.70 cm RV S prime:     10.30 cm/s TAPSE (M-mode): 1.7 cm LEFT ATRIUM             Index       RIGHT ATRIUM           Index LA diam:        3.80 cm 2.29 cm/m  RA Area:     16.80 cm LA Vol (A2C):   77.5 ml 46.73 ml/m RA Volume:   46.20 ml  27.86 ml/m LA Vol (A4C):   71.0 ml 42.81 ml/m LA Biplane Vol: 74.8 ml 45.10 ml/m  AORTIC VALVE AV Area (Vmax):    2.40 cm AV Area (Vmean):   2.45 cm AV Area (VTI):     2.21 cm AV Vmax:  142.00 cm/s AV Vmean:          99.000 cm/s AV VTI:            0.244 m AV Peak Grad:      8.1 mmHg AV Mean Grad:      4.0 mmHg LVOT Vmax:         120.00 cm/s LVOT Vmean:        85.400 cm/s LVOT VTI:          0.190 m LVOT/AV VTI ratio: 0.78 AI PHT:            816 msec  AORTA Ao Root diam: 2.90 cm Ao Asc diam:  3.00 cm MITRAL VALVE                 TRICUSPID VALVE MV Area (PHT): 1.98 cm      TR Peak grad:   49.3 mmHg MV Peak grad:  26.8 mmHg     TR Vmax:        351.00 cm/s MV Mean grad:  16.0 mmHg MV Vmax:       2.59 m/s      SHUNTS MV Vmean:      188.0 cm/s    Systemic VTI:  0.19 m MV Decel Time: 383 msec      Systemic Diam: 1.90 cm MR Peak grad:    120.6 mmHg MR Mean grad:    84.0 mmHg MR Vmax:         549.00  cm/s MR Vmean:        437.0 cm/s MR PISA:         2.26 cm MR PISA Eff ROA: 16 mm MR PISA Radius:  0.60 cm MV E velocity: 193.00 cm/s MV A velocity: 221.00 cm/s MV E/A ratio:  0.87 Gwyndolyn Kaufman MD Electronically signed by Gwyndolyn Kaufman MD Signature Date/Time: 09/18/2020/1:21:39 PM    Final    ECHO TEE  Result Date: 09/20/2020    TRANSESOPHOGEAL ECHO REPORT   Patient Name:   [name] Date of Exam: 09/20/2020 Medical Rec #:  [id number]          Height:       64.0 in Accession #:    [id number]         Weight:       127.4 lb Date of Birth:  June 13, 1976           BSA:          1.615 m Patient Age:    68 years           BP:           180/95 mmHg Patient Gender: F                  HR:           107 bpm. Exam Location:  Inpatient Procedure: Transesophageal Echo, 3D Echo, Color Doppler, Cardiac Doppler and            Saline Contrast Bubble Study Indications:     Stroke  History:         Patient has prior history of Echocardiogram examinations, most                  recent 09/18/2020. Risk Factors:Hypertension, Dyslipidemia and                  Lupus.  Sonographer:     Raquel Sarna Senior RDCS Referring Phys:  [place] Diagnosing  Phys: Mertie Moores MD PROCEDURE: After discussion of the risks and benefits of a TEE, an informed consent was obtained from the patient. The transesophogeal probe was passed without difficulty through the esophogus of the patient. Local oropharyngeal anesthetic was provided with Cetacaine. Sedation performed by different physician. The patient was monitored while under deep sedation. Anesthestetic sedation was provided intravenously by Anesthesiology: 171m of Propofol, 442mof Lidocaine. The patient developed no complications during the procedure. IMPRESSIONS  1. Left ventricular ejection fraction, by estimation, is 55 to 60%. The left ventricle has normal function.  2. Right ventricular systolic function is normal. The right ventricular size is normal.  3. No left  atrial/left atrial appendage thrombus was detected.  4. The mean MS gradient is 14 mmhg.     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 This could be the cause of her stroke.     3-D images of the mitral valve were obtained. . The mitral valve is abnormal. Moderate to severe mitral valve regurgitation. Moderate to severe mitral stenosis. Moderate to severe mitral annular calcification.  5. Tricuspid valve regurgitation is moderate to severe.  6. The aortic valve is grossly normal. Aortic valve regurgitation is mild to moderate. FINDINGS  Left Ventricle: Left ventricular ejection fraction, by estimation, is 55 to 60%. The left ventricle has normal function. The left ventricular internal cavity size was normal in size. Right Ventricle: The right ventricular size is normal. No increase in right ventricular wall thickness. Right ventricular systolic function is normal. Left Atrium: Left atrial size was not well visualized. No left atrial/left atrial appendage thrombus was detected. Right Atrium: Right atrial size was not well visualized. Pericardium: There is no evidence of pericardial effusion. Mitral Valve: The mean MS gradient is 14 mmhg. 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 This could be the cause of her stroke. 3-D images of the mitral valve were obtained. The mitral valve is abnormal. There is severe thickening of the mitral valve leaflet(s). There is severe calcification of the mitral valve leaflet(s). Moderate to severe mitral annular calcification. Moderate  to severe mitral valve regurgitation. Moderate to severe mitral valve stenosis. MV peak gradient, 22.1 mmHg. The mean mitral valve gradient is 14.0 mmHg. Pulmonary venous flow is normal. Tricuspid Valve: The tricuspid valve is grossly normal. Tricuspid valve  regurgitation is moderate to severe. Aortic Valve: The aortic valve is grossly normal. Aortic valve regurgitation is mild to moderate. Pulmonic Valve: The pulmonic valve was grossly normal. Pulmonic valve regurgitation is not visualized. Aorta: The aortic root and ascending aorta are structurally normal, with no evidence of dilitation. IAS/Shunts: No atrial level shunt detected by color flow Doppler. Agitated saline contrast was given intravenously to evaluate for intracardiac shunting. With bubble contrast, there were bubbles that appeared in the LA aproximately 8-9 cardiac cycles after injection. These are most likely due to a transpulmonary AVM or shunt. This is not c/w an ASD or PFO.  MITRAL VALVE MV Peak grad: 22.1 mmHg MV Mean grad: 14.0 mmHg MV Vmax:      2.35 m/s MV Vmean:     178.0 cm/s PhMertie MooresD Electronically signed by PhMertie MooresD Signature Date/Time: 09/20/2020/5:51:40 PM    Final    VAS [place]OWER EXTREMITY VENOUS (DVT)  Result Date: 09/18/2020  Lower Venous DVT Study Indications: Stroke, and  Lupus.  Comparison Study: No prior study Performing Technologist: Sharion Dove RVS  Examination Guidelines: A complete evaluation includes B-mode imaging, spectral Doppler, color Doppler, and power Doppler as needed of all accessible portions of each vessel. Bilateral testing is considered an integral part of a complete examination. Limited examinations for reoccurring indications may be performed as noted. The reflux portion of the exam is performed with the patient in reverse Trendelenburg.  +---------+---------------+---------+-----------+----------+--------------+ RIGHT    CompressibilityPhasicitySpontaneityPropertiesThrombus Aging +---------+---------------+---------+-----------+----------+--------------+ CFV      Full           Yes      Yes                                 +---------+---------------+---------+-----------+----------+--------------+ SFJ      Full                                                         +---------+---------------+---------+-----------+----------+--------------+ FV Prox  Full                                                        +---------+---------------+---------+-----------+----------+--------------+ FV Mid   Full                                                        +---------+---------------+---------+-----------+----------+--------------+ FV DistalFull                                                        +---------+---------------+---------+-----------+----------+--------------+ PFV      Full                                                        +---------+---------------+---------+-----------+----------+--------------+ POP      Full           Yes      Yes                                 +---------+---------------+---------+-----------+----------+--------------+ PTV      Full                                                        +---------+---------------+---------+-----------+----------+--------------+ PERO     Full                                                        +---------+---------------+---------+-----------+----------+--------------+   +---------+---------------+---------+-----------+----------+--------------+  LEFT     CompressibilityPhasicitySpontaneityPropertiesThrombus Aging +---------+---------------+---------+-----------+----------+--------------+ CFV      Full           Yes      Yes                                 +---------+---------------+---------+-----------+----------+--------------+ SFJ      Full                                                        +---------+---------------+---------+-----------+----------+--------------+ FV Prox  Full                                                        +---------+---------------+---------+-----------+----------+--------------+ FV Mid   Full                                                         +---------+---------------+---------+-----------+----------+--------------+ FV DistalFull                                                        +---------+---------------+---------+-----------+----------+--------------+ PFV      Full                                                        +---------+---------------+---------+-----------+----------+--------------+ POP      Full           Yes      Yes                                 +---------+---------------+---------+-----------+----------+--------------+ PTV      Full                                                        +---------+---------------+---------+-----------+----------+--------------+ PERO     Full                                                        +---------+---------------+---------+-----------+----------+--------------+     Summary: BILATERAL: - No evidence of deep vein thrombosis seen in the lower extremities, bilaterally. -   *See table(s) above for measurements and observations. Electronically signed by [name] MD on 09/18/2020 at 2:08:18 PM.    Final  12-lead ECG 09/19/2020 with NSR at 81 (personally reviewed) All prior EKG's in EPIC reviewed with no documented atrial fibrillation  Telemetry NSR/ ST 70-100s (personally reviewed)  Assessment and Plan:  1. 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  The patient has previously undergone TEE as above. I spoke at length with the patient about monitoring for afib with an implantable loop recorder, external event monitor, and anticoagulation. Discussed case with Dr. Rayann Heman.  With severe MS, LAE, HTN, and h/o stroke she has a very high risk of atrial fibrillation and recurrent stroke. Will discuss further with Dr. Burt Knack and Dr. Erlinda Hong.  Platelets 106 09/21/2020. Had been running in 106s. We discussed the risks and benefits for ILR implantation including bleeding and infection. She verbalizes understanding and agrees to  proceed pending MD discussions.  2. ESRD on HD T/Th/Sat Per primary/Nephrology  3. Lupus Stable.   4. Palpitations Previous monitoring negative for arrhyhtmia. + rare PACs  5. HTN Normo to Hypertensive on diltiazem and toprol given severe MS.  Will leave further management to neuro given recent stroke.   Wound care and restrictions reviewed with patient and will be placed on chart and AVS pending disposition.   Shirley Friar, PA-C 09/21/2020 10:42 AM  I have seen, examined the patient, and reviewed the above assessment and plan.  Changes to above are made where necessary.  On exam, RRR.  The patient is chronically ill appearing.  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  She has a h/o palpitations and almost certainly has afib. I worry about atrial stasis/ slow flow with her severe MS and atrial enlargement.  I have spoken with both Drs Burt Knack and Erlinda Hong.  I suspect the appropriate approach would be to initiate oral anticoagulation therapy rather than antiplatelet therapy.  Unfortunately, she is at risk for bleeding given renal failure and thrombocytopenia.  I will defer to neurology, cardiology and primary team.  I do think that it would be beneficial to proceed with ILR to evaluate for afib. Risks and benefits to long term monitoring with an implanted loop recorder were discussed in detail with the patient today who wishes to proceed. We will therefore proceed with ILR at this time.   Co Sign: Thompson Grayer, MD 09/22/2020 2:34 PM

## 2020-09-21 NOTE — Progress Notes (Signed)
  Have discussed with patient and reviewed chart.  She has agreed to loop placement, pending discharge timing. Full note pending disposition.   Previous EKGs and tele reviewed with no known AF.   Legrand Como [address] [place], PA-C  09/21/2020 12:38 PM

## 2020-09-22 ENCOUNTER — Encounter (HOSPITAL_COMMUNITY)
Admission: EM | Disposition: A | Discharge status: Rehabilitation Facility | Payer: Self-pay | Source: Home / Self Care | Attending: Family Medicine

## 2020-09-22 ENCOUNTER — Encounter (HOSPITAL_COMMUNITY): Payer: Self-pay | Admitting: Cardiovascular Disease

## 2020-09-22 ENCOUNTER — Inpatient Hospital Stay (HOSPITAL_COMMUNITY): Payer: Medicare Other

## 2020-09-22 DIAGNOSIS — I639 Cerebral infarction, unspecified: Secondary | ICD-10-CM | POA: Diagnosis not present

## 2020-09-22 DIAGNOSIS — I6389 Other cerebral infarction: Secondary | ICD-10-CM

## 2020-09-22 HISTORY — PX: LOOP RECORDER INSERTION: EP1214

## 2020-09-22 LAB — CBC WITH DIFFERENTIAL/PLATELET
Abs Immature Granulocytes: 0.4 10*3/uL — ABNORMAL HIGH (ref 0.00–0.07)
Basophils Absolute: 0.1 10*3/uL (ref 0.0–0.1)
Basophils Relative: 1 %
Eosinophils Absolute: 0.1 10*3/uL (ref 0.0–0.5)
Eosinophils Relative: 1 %
HCT: 39.2 % (ref 36.0–46.0)
Hemoglobin: 12.3 g/dL (ref 12.0–15.0)
Immature Granulocytes: 3 %
Lymphocytes Relative: 10 %
Lymphs Abs: 1.2 10*3/uL (ref 0.7–4.0)
MCH: 28.5 pg (ref 26.0–34.0)
MCHC: 31.4 g/dL (ref 30.0–36.0)
MCV: 90.7 fL (ref 80.0–100.0)
Monocytes Absolute: 1.8 10*3/uL — ABNORMAL HIGH (ref 0.1–1.0)
Monocytes Relative: 15 %
Neutro Abs: 8.8 10*3/uL — ABNORMAL HIGH (ref 1.7–7.7)
Neutrophils Relative %: 70 %
Platelets: 99 10*3/uL — ABNORMAL LOW (ref 150–400)
RBC: 4.32 MIL/uL (ref 3.87–5.11)
RDW: 18.7 % — ABNORMAL HIGH (ref 11.5–15.5)
WBC: 12.4 10*3/uL — ABNORMAL HIGH (ref 4.0–10.5)
nRBC: 0 % (ref 0.0–0.2)

## 2020-09-22 LAB — RENAL FUNCTION PANEL
Albumin: 3 g/dL — ABNORMAL LOW (ref 3.5–5.0)
Anion gap: 17 — ABNORMAL HIGH (ref 5–15)
BUN: 65 mg/dL — ABNORMAL HIGH (ref 6–20)
CO2: 21 mmol/L — ABNORMAL LOW (ref 22–32)
Calcium: 8.2 mg/dL — ABNORMAL LOW (ref 8.9–10.3)
Chloride: 98 mmol/L (ref 98–111)
Creatinine, Ser: 7.64 mg/dL — ABNORMAL HIGH (ref 0.44–1.00)
GFR, Estimated: 6 mL/min — ABNORMAL LOW (ref >60)
Glucose, Bld: 103 mg/dL — ABNORMAL HIGH (ref 70–99)
Phosphorus: 7.9 mg/dL — ABNORMAL HIGH (ref 2.5–4.6)
Potassium: 5.7 mmol/L — ABNORMAL HIGH (ref 3.5–5.1)
Sodium: 136 mmol/L (ref 135–145)

## 2020-09-22 LAB — ANTIPHOSPHOLIPID SYNDROME EVAL, BLD
Anticardiolipin IgA: <9 APL U/mL (ref 0–11)
Anticardiolipin IgG: <9 GPL U/mL (ref 0–14)
Anticardiolipin IgM: 89 MPL U/mL — ABNORMAL HIGH (ref 0–12)
DRVVT: 41.6 s (ref 0.0–47.0)
PTT Lupus Anticoagulant: 33 s (ref 0.0–51.9)
Phosphatydalserine, IgA: 1 APS IgA (ref 0–20)
Phosphatydalserine, IgG: <10 Units (ref 0–30)
Phosphatydalserine, IgM: 39 MPS IgM — ABNORMAL HIGH (ref <22)

## 2020-09-22 SURGERY — LOOP RECORDER INSERTION

## 2020-09-22 MED ORDER — DILTIAZEM HCL ER COATED BEADS 120 MG PO CP24: 120.0000 mg | ORAL_CAPSULE | Freq: Every day | ORAL | Status: DC

## 2020-09-22 MED ORDER — METOPROLOL TARTRATE 25 MG PO TABS
25.0000 mg | ORAL_TABLET | Freq: Two times a day (BID) | ORAL | Status: DC
  Filled 2020-09-22: qty 1

## 2020-09-22 MED ORDER — METOPROLOL TARTRATE 25 MG PO TABS: 25.0000 mg | ORAL_TABLET | Freq: Two times a day (BID) | ORAL | Status: DC

## 2020-09-22 MED ORDER — LIDOCAINE-EPINEPHRINE 1 %-1:100000 IJ SOLN
INTRAMUSCULAR | Status: DC | PRN
  Administered 2020-09-22: 10 mL

## 2020-09-22 MED ORDER — SODIUM ZIRCONIUM CYCLOSILICATE 10 G PO PACK
10.0000 g | PACK | Freq: Once | ORAL | Status: AC
  Administered 2020-09-22: 10 g via ORAL
  Filled 2020-09-22: qty 1

## 2020-09-22 SURGICAL SUPPLY — 2 items
MONITOR REVEAL LINQ II (Prosthesis & Implant Heart) ×2 IMPLANT
PACK LOOP INSERTION (CUSTOM PROCEDURE TRAY) ×3 IMPLANT

## 2020-09-22 NOTE — Progress Notes (Addendum)
FPTS Interim Progress Note  S: Went to bedside to evaluate patient given continued low blood pressures.  Patient reports that she feels a little bit dizzy, but not as bad as when she came in, which was her initial chief complaint.  Patient reports that she has not gotten out of bed.  She denies any cough, shortness of breath, chest pain, abdominal pain, dysuria. O: BP (!) 85/60 (BP Location: Left Arm)   Pulse 62   Temp 97.7 F (36.5 C) (Oral)   Resp 16   Ht 5\' 4"  (1.626 m)   Wt 57.8 kg   LMP 08/10/2020   SpO2 100%   BMI 21.87 kg/m   General: Well-appearing female, no acute distress, nontoxic.  Conversing normally with no obvious neurologic deficit.  A/P: Unclear cause of hypotension at this time.  Can consider new metoprolol 50 mg twice daily.  Her last dose was yesterday evening.  Her dose was held this morning.  Have also decreased her diltiazem dose to 120 mg from 240 mg nightly.  Otherwise, no clear sign of infection, acute blood loss. Will obtain add on CBC to this morning's lab draw. Spoke to Dr. Joylene Grapes, who recommends holding dialysis today and will provide [facility] for her potassium of 5.7.   Can consider providing small fluid bolus as she does not look volume overloaded, however, would be very cautious given her history of severe mitral stenosis/regurge & severe tricuspid regurg.  Continue to monitor closely.  Will call RN to ensure BP cuff size and placement.   Wilber Oliphant, MD 09/22/2020, 12:15 PM PGY-3, [facility] pager [phone]   Addendum CBC returned with mildly elevated leukocytosis at 12.4, up from 11.9 yesterday (9.4 on admission).  Given continued hypotension, will obtain blood cultures and chest x-ray.  We will not start antibiotics at this time.

## 2020-09-22 NOTE — Interval H&P Note (Signed)
History and Physical Interval Note:  09/22/2020 2:37 PM  [name]  has presented today for surgery, with the diagnosis of Cryptogenic stroke.  The various methods of treatment have been discussed with the patient and family. After consideration of risks, benefits and other options for treatment, the patient has consented to  Procedure(s): LOOP RECORDER INSERTION (N/A) as a surgical intervention.  The patient's history has been reviewed, patient examined, no change in status, stable for surgery.  I have reviewed the patient's chart and labs.  Questions were answered to the patient's satisfaction.     Thompson Grayer

## 2020-09-22 NOTE — Progress Notes (Signed)
[facility] Progress Note   Subjective:  Patient feels well today with no complaints.  Some low blood pressures but asymptomatic.  Awaiting rehab admission  Objective Vitals:   09/22/20 0031 09/22/20 0400 09/22/20 0834 09/22/20 1100  BP: 111/82 92/72 90/70  (!) 85/60  Pulse: 66 66 62   Resp: 17  16   Temp: 98.8 F (37.1 C) 98.5 F (36.9 C) 97.7 F (36.5 C)   TempSrc: Oral Oral Oral   SpO2: 97% 100% 100%   Weight:      Height:         Additional Objective Labs: Basic Metabolic Panel: Recent Labs  Lab 09/19/20 0242 09/19/20 0242 09/20/20 0511 09/21/20 1810 09/22/20 0957  NA 134*   < > 133* 137 136  K 4.3   < > 5.2* 5.2* 5.7*  CL 97*   < > 96* 101 98  CO2 25   < > 22 20* 21*  GLUCOSE 174*   < > 156* 188* 103*  BUN 46*   < > 71* 50* 65*  CREATININE 6.85*   < > 9.01* 6.64* 7.64*  CALCIUM 8.1*   < > 8.0* 8.1* 8.2*  PHOS 6.3*  --   --  6.6* 7.9*   < > = values in this interval not displayed.   CBC: Recent Labs  Lab 09/17/20 1303 09/17/20 1311 09/18/20 0201 09/18/20 0201 09/19/20 0242 09/20/20 0511 09/21/20 1810  WBC 12.1*   < > 9.4   < > 10.9* 10.3 11.9*  NEUTROABS 9.9*  --   --   --   --   --   --   HGB 10.9*   < > 11.3*   < > 10.4* 10.3* 11.8*  HCT 35.1*   < > 35.7*   < > 33.6* 32.7* 38.0  MCV 91.9  --  90.4  --  91.6 90.1 91.1  PLT 78*   < > 75*   < > 74* 75* 106*   < > = values in this interval not displayed.   Blood Culture    Component Value Date/Time   SDES BLOOD RIGHT FOREARM UPPER 03/15/2020 0408   SPECREQUEST  03/15/2020 0408    BOTTLES DRAWN AEROBIC AND ANAEROBIC Blood Culture adequate volume   CULT  03/15/2020 0408    NO GROWTH 5 DAYS Performed at [facility], [place] [address], [place], [place] [address]    REPTSTATUS 03/20/2020 FINAL 03/15/2020 0408     Physical Exam General: lying in bed no distress Heart: normal rate Lungs: bilateral chest rise, no iwob Abdomen: Soft non-tender  Extremities: No lower  extremity edema Dialysis Access: R IJ TDC dsg c,d,i; R AVF +bruit   Medications:  . aspirin EC  325 mg Oral Daily  . atorvastatin  40 mg Oral Daily  . calcium acetate (Phos Binder)  1,334 mg Oral TID WC  . Chlorhexidine Gluconate Cloth  6 each Topical Daily  . diltiazem  120 mg Oral QHS  . metoprolol tartrate  25 mg Oral BID  . pantoprazole  40 mg Oral Daily  . PARoxetine  40 mg Oral Daily  . predniSONE  10 mg Oral Q breakfast  . traZODone  100 mg Oral QHS    Dialysis Orders:  [facility] TTS 3h 400/600 2K/2.5Ca EDW 58kg TDC Hep bolus 1800 EPO 6600 q HD Venofer 50 q Mon  Assessment/Plan: 1. Dizziness/Ataxia. MRI showing subcentimeter acute infarct involving the midline inferior vermis. Chronic left parietal infarct -per neuro/primary  team. On antiplatelet therapy.  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  Cardiology reviewed further and thinks this is simply calcification.  Undergoing implanted loop recorder to rule out atrial fibrillation 2. ESRD -  HD TTS via TDC.  Continue dialysis schedule.  OP fistulogram will be rescheduled 3. Hypertension/volume  -blood pressure lower today.  Decrease diltiazem to 120 mg daily.  Hold medications this morning before dialysis 4. Anemia  - Hgb acceptable. No ESA needs currently  5. Metabolic bone disease -  Ca ok. Continue home binders (phoslyra). No VDRA.  6.  SLE - anti-dsdna norma, Ro and La high 7. Mitral stenosis: Conservative management at this time.  Cardiology following

## 2020-09-22 NOTE — H&P (Signed)
Physical Medicine and Rehabilitation Admission H&P    Chief Complaint  Patient presents with  . Emesis  . Diarrhea  . 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  Patient lives alone 1 level home.  Independent with assistive device.  Patient does not drive and receive transportation via a neighbor.  She states she has a niece that assists as needed.  Presented 09/17/2020 with dizziness, nausea, vomiting as well as stool incontinence.  She initially presented to [facility] urgent care with dizziness and provided with antiemetics diagnosed with classic vertigo discharged home.  Patient with progressive symptoms and patient came to [facility] for further evaluation.  MRI of the brain showed subcentimeter acute infarct involving the midline inferior vermis.  Chronic left parietal infarct.  CT angiogram of head and neck no hemodynamically significant stenosis in the neck.  No proximal intracranial vessel occlusion.  Admission chemistries potassium 5.3 glucose 170 BUN 56 creatinine 8.21 AST 78 ALT 105 alcohol negative.  WBC 12,100 hemoglobin 10.9 echocardiogram with severe left atrial dilation and mitral stenosis in the setting of significant annular calcifications.  TEE showed normal left ventricular function no AAS or thrombi.  Currently maintained on aspirin 325 mg for CVA prophylaxis and plan for loop recorder placement.  Hemodialysis ongoing as per renal services with plan schedule for outpatient fistulogram.  She is tolerating a regular consistency diet.  Therapy evaluations completed and patient was admitted for a comprehensive rehab program.  Review of Systems  Constitutional: Positive for malaise/fatigue. Negative for chills and fever.  HENT: Negative for hearing loss.   Eyes: Negative for  blurred vision and double vision.  Respiratory: Negative for cough and shortness of breath.   Cardiovascular: Positive for leg swelling. Negative for chest pain and palpitations.  Gastrointestinal: Positive for nausea and vomiting.       GERD  Genitourinary: Negative for dysuria, flank pain and hematuria.  Musculoskeletal: Positive for joint pain and myalgias.  Skin: Negative for rash.  Neurological: Positive for dizziness, weakness and headaches.  Psychiatric/Behavioral: Positive for depression. The patient has insomnia.        Anxiety  All other systems reviewed and are negative.  Past Medical History:  Diagnosis Date  . Allergy   . Anemia   . Anxiety   . Arthritis   . Avascular necrosis of bone (HCC)    left tibial talus due to chronic prednisone use  . Cerebellar stroke ([place])   . Depression   . Dyspnea    too much  fluid  . ESRD (end stage renal disease) on dialysis ([place])    tthsat [place]  . GERD (gastroesophageal reflux disease)   . Headache(784.0)   . History of high blood pressure   . Hyperlipemia   . Hypertension   . Lupus ([place])   . Pneumonia   . Secondary hyperparathyroidism (of renal origin)    Past Surgical History:  Procedure Laterality Date  . A/V FISTULAGRAM Left 12/02/2017   Procedure: A/V FISTULAGRAM - Left upper;  Surgeon: Angelia Mould, MD;  Location: [place] [facility];  Service: Cardiovascular;  Laterality: Left;  . AV FISTULA PLACEMENT Left   . AV FISTULA PLACEMENT Right 01/26/2014   Procedure: ARTERIOVENOUS (AV) FISTULA CREATION; ultrasound guided;  Surgeon: Conrad [place], MD;  Location: [place];  Service: Vascular;  Laterality: Right;  . AV FISTULA PLACEMENT Right 04/25/2020   Procedure: RIGHT UPPER ARM  BASILIC VEIN ARTERIOVENOUS (AV) FISTULA;  Surgeon: [name], MD;  Location: [place];  Service: Vascular;  Laterality: Right;  . BASCILIC VEIN TRANSPOSITION Right 07/01/2020   Procedure: SECOND STAGE BASCILIC VEIN TRANSPOSITION  RIGHT UPPER EXTREMITY;  Surgeon: [name], MD;  Location: [facility] OR;  Service: Vascular;  Laterality: Right;  . ESOPHAGOGASTRODUODENOSCOPY N/A 03/17/2016   Procedure: ESOPHAGOGASTRODUODENOSCOPY (EGD);  Surgeon: Irene Shipper, MD;  Location: [facility] ENDOSCOPY;  Service: Endoscopy;  Laterality: N/A;  . FISTULOGRAM Left 09/24/2019   Procedure: FISTULOGRAM LEFT ARM;  Surgeon: [name], MD;  Location: [place];  Service: Vascular;  Laterality: Left;  . HEMATOMA EVACUATION Left 10/09/2013   Procedure: EVACUATION HEMATOMA;  Surgeon: Angelia Mould, MD;  Location: [place];  Service: Vascular;  Laterality: Left;  . HEMATOMA EVACUATION Left 09/24/2019   Procedure: EVACUATION HEMATOMA  LEFT ARM;  Surgeon: [name], MD;  Location: [place];  Service: Vascular;  Laterality: Left;  . INCISION AND DRAINAGE ABSCESS     gluteal  . INSERTION OF DIALYSIS CATHETER N/A 10/09/2013   Procedure: INSERTION OF DIALYSIS CATHETER; ULTRASOUND GUIDED;  Surgeon: Angelia Mould, MD;  Location: [place];  Service: Vascular;  Laterality: N/A;  . INSERTION OF DIALYSIS CATHETER Left 09/24/2019   Procedure: INSERTION OF TUNNELLED DIALYSIS CATHETER - PALINDROME 15FR X 28CM;  Surgeon: [name], MD;  Location: [place];  Service: Vascular;  Laterality: Left;  . INSERTION OF DIALYSIS CATHETER Right 03/15/2020   Procedure: Insertion Of Dialysis Catheter;  Surgeon: Angelia Mould, MD;  Location: [place];  Service: Cardiovascular;  Laterality: Right;  . PERIPHERAL VASCULAR BALLOON ANGIOPLASTY Left 12/02/2017   Procedure: PERIPHERAL VASCULAR BALLOON ANGIOPLASTY;  Surgeon: Angelia Mould, MD;  Location: [place] [facility];  Service: Cardiovascular;  Laterality: Left;  . REVISION OF ARTERIOVENOUS GORETEX GRAFT Left 04/03/2019   Procedure: REVISION OF ARTERIOVENOUS GORETEX GRAFT PSEUDOANEURYSM;  Surgeon: Angelia Mould, MD;  Location: [place];  Service: Vascular;  Laterality: Left;  .  SHUNTOGRAM N/A 01/07/2014   Procedure: fistulogram with possibe venoplasty left upper arm avg;  Surgeon: Angelia Mould, MD;  Location: [facility] CATH LAB;  Service: Cardiovascular;  Laterality: N/A;  . THROMBECTOMY AND REVISION OF ARTERIOVENTOUS (AV) GORETEX  GRAFT Left 03/15/2020   Procedure: ATTEMPTED THROMBECTOMY OF LEFT ARM ARTERIOVENTOUS (AV) GORETEX  GRAFT;  Surgeon: Angelia Mould, MD;  Location: [place];  Service: Cardiovascular;  Laterality: Left;  . tibiocalcaneal fusion  left Left   . ULTRASOUND GUIDANCE FOR VASCULAR ACCESS  09/24/2019   Procedure: Ultrasound Guidance For Vascular Access;  Surgeon: [name], MD;  Location: [facility] OR;  Service: Vascular;;   Family History  Problem Relation Age of Onset  . Asthma Sister   . Hypertension Mother   . Heart attack Mother 4       died at 50  . COPD Maternal Uncle        prostate  . Mental illness Neg Hx    Social History:  reports that she has never smoked. She has never used smokeless tobacco. She reports that she does not drink alcohol and does not use drugs. Allergies:  Allergies  Allergen Reactions  . Amlodipine Hives and Swelling  . Sulfa Antibiotics Hives and Itching  . Vancomycin Hives and Itching  . Venlafaxine Other (See Comments)    Emotional   Medications Prior to  Admission  Medication Sig Dispense Refill  . acetaminophen (TYLENOL) 500 MG tablet Take 1,000 mg by mouth every 6 (six) hours as needed for moderate pain or headache.     . b complex-vitamin c-folic acid (NEPHRO-VITE) 0.8 MG TABS tablet Take 1 tablet by mouth daily at 12 noon.    . calcium acetate, Phos Binder, (PHOSLYRA) 667 MG/5ML SOLN Take 2,001 mg by mouth See admin instructions. Take 15 mls (2001 mg) by mouth with each meal and snack - up to 9 times daily    . Carboxymethylcellul-Glycerin (LUBRICATING EYE DROPS OP) Place 1 drop into both eyes daily as needed (dry eyes).    Marland Kitchen diltiazem (TIAZAC) 240 MG 24 hr capsule Take 240 mg by mouth at  bedtime.     . hydrALAZINE (APRESOLINE) 25 MG tablet Take 25 mg by mouth in the morning and at bedtime.    . pantoprazole (PROTONIX) 40 MG tablet Take 1 tablet (40 mg total) by mouth daily. 30 tablet 3  . PARoxetine (PAXIL) 40 MG tablet Take 40 mg by mouth daily.    . predniSONE (DELTASONE) 10 MG tablet Take 1 tablet (10 mg total) by mouth daily with breakfast. 90 tablet 3  . traZODone (DESYREL) 100 MG tablet Take 100 mg by mouth at bedtime.    . clopidogrel (PLAVIX) 75 MG tablet Take 75 mg by mouth daily.    Marland Kitchen HYDROcodone-acetaminophen (NORCO/VICODIN) 5-325 MG tablet Take 1 tablet by mouth every 6 (six) hours as needed for moderate pain. (Patient not taking: Reported on 09/14/2020) 12 tablet 0  . meclizine (ANTIVERT) 25 MG tablet Take 25 mg by mouth 3 (three) times daily as needed.      Drug Regimen Review Drug regimen was reviewed and remains appropriate with no significant issues identified  Home: Home Living Family/patient expects to be discharged to:: Private residence Living Arrangements: Alone Available Help at Discharge: Family, Neighbor Type of Home: Apartment Home Access: Stairs to enter, Media planner Home Layout: One level Bathroom Shower/Tub: Chiropodist: Standard Home Equipment: Cane - single point, Grab bars - toilet, Grab bars - tub/shower, Hand held shower head Additional Comments: no animals   Functional History: Prior Function Level of Independence: Independent with assistive device(s) Comments: transportation via a neighbor  Functional Status:  Mobility: Bed Mobility Overal bed mobility: Modified Independent Bed Mobility: Supine to Sit, Sit to Supine, Rolling Rolling: Modified independent (Device/Increase time) Supine to sit: Modified independent (Device/Increase time), HOB elevated Sit to supine: Modified independent (Device/Increase time), HOB elevated General bed mobility comments: Extra time, but able to come to transition supine <> sit  EOB with HOB elevated safely. Rolling with utilization of bed rails. Transfers Overall transfer level: Needs assistance Equipment used: Straight cane Transfers: Sit to/from Stand, Stand Pivot Transfers Sit to Stand: Min assist Stand pivot transfers: Min assist General transfer comment: STS 1x and stand step transfers 2x with slightly increased time to power up to stand, but no overt LOB. MinA for safety as unsteadiness was noted. Ambulation/Gait Ambulation/Gait assistance: Min assist Gait Distance (Feet): 50 Feet Assistive device: Straight cane Gait Pattern/deviations: Step-through pattern, Decreased stride length General Gait Details: SPC in R hand, displaying appropriate use of cane. Displayed decreased stride length and moderate trunk sway which increased as distance progressed and pt became SOB but SpO2 remained >/= 96%. Pt noted feeling weak and requested to sit down. MinA to maintain balance, esp when cued to rotate head during gait. Gait velocity: decreased Gait velocity interpretation: <1.8 ft/sec,  indicate of risk for recurrent falls    ADL: ADL Overall ADL's : Needs assistance/impaired Grooming: Wash/dry face, Wash/dry hands, Oral care, Standing, Min guard Upper Body Bathing: Min guard Lower Body Dressing: Supervision/safety, Bed level Lower Body Dressing Details (indicate cue type and reason): able to don socks long sitting Toilet Transfer: Min guard, RW Functional mobility during ADLs: Min guard, Rolling walker General ADL Comments: pt asking when she can progress past RW use but also reports unsteady at this time time without it  Cognition: Cognition Overall Cognitive Status: Within Functional Limits for tasks assessed Orientation Level: Oriented X4 Cognition Arousal/Alertness: Awake/alert Behavior During Therapy: WFL for tasks assessed/performed Overall Cognitive Status: Within Functional Limits for tasks assessed  Physical Exam: Blood pressure 92/72, pulse 66,  temperature 98.5 F (36.9 C), temperature source Oral, resp. rate 17, height 5\' 4"  (1.626 m), weight 57.8 kg, last menstrual period 08/10/2020, SpO2 100 %. General: Alert and oriented x 3, No apparent distress, fatigued HEENT: Head is normocephalic, atraumatic, PERRLA, EOMI, sclera anicteric, oral mucosa pink and moist, dentition intact, ext ear canals clear,  Neck: Supple without JVD or lymphadenopathy Heart: Reg rate and rhythm. No murmurs rubs or gallops Chest: CTA bilaterally without wheezes, rales, or rhonchi; no distress Abdomen: Soft, non-tender, non-distended, bowel sounds positive. Extremities: Nonpitting edema, left>right Skin: Clean and intact without signs of breakdown Neuro: Patient is alert.  No acute distress and cooperative with exam.  Makes eye contact with examiner.  Oriented x3 and follows commands. 4+/5 strength throughout Psych: Pt's affect is appropriate. Pt is cooperative  Results for orders placed or performed during the hospital encounter of 09/17/20 (from the past 48 hour(s))  CBC     Status: Abnormal   Collection Time: 09/21/20  6:10 PM  Result Value Ref Range   WBC 11.9 (H) 4.0 - 10.5 K/uL   RBC 4.17 3.87 - 5.11 MIL/uL   Hemoglobin 11.8 (L) 12.0 - 15.0 g/dL   HCT 38.0 36 - 46 %   MCV 91.1 80.0 - 100.0 fL   MCH 28.3 26.0 - 34.0 pg   MCHC 31.1 30.0 - 36.0 g/dL   RDW 18.9 (H) 11.5 - 15.5 %   Platelets 106 (L) 150 - 400 K/uL    Comment: REPEATED TO VERIFY Immature Platelet Fraction may be clinically indicated, consider ordering this additional test [id number] CONSISTENT WITH PREVIOUS RESULT    nRBC 0.0 0.0 - 0.2 %    Comment: Performed at [facility], [place] [address], [place], [place] [address]  Renal function panel     Status: Abnormal   Collection Time: 09/21/20  6:10 PM  Result Value Ref Range   Sodium 137 135 - 145 mmol/L   Potassium 5.2 (H) 3.5 - 5.1 mmol/L   Chloride 101 98 - 111 mmol/L   CO2 20 (L) 22 - 32 mmol/L   Glucose, Bld 188 (H) 70 -  99 mg/dL    Comment: Glucose reference range applies only to samples taken after fasting for at least 8 hours.   BUN 50 (H) 6 - 20 mg/dL   Creatinine, Ser 6.64 (H) 0.44 - 1.00 mg/dL   Calcium 8.1 (L) 8.9 - 10.3 mg/dL   Phosphorus 6.6 (H) 2.5 - 4.6 mg/dL   Albumin 2.9 (L) 3.5 - 5.0 g/dL   GFR, Estimated 7 (L) >60 mL/min    Comment: (NOTE) Calculated using the CKD-EPI Creatinine Equation (2021)    Anion gap 16 (H) 5 - 15    Comment:  Performed at [facility], [place] [address] [place], [place] [address]   ECHO TEE  Result Date: 09/20/2020    TRANSESOPHOGEAL ECHO REPORT   Patient Name:   [name] Date of Exam: 09/20/2020 Medical Rec #:  [id number]          Height:       64.0 in Accession #:    [id number]         Weight:       127.4 lb Date of Birth:  May 16, 1976           BSA:          1.615 m Patient Age:    84 years           BP:           180/95 mmHg Patient Gender: F                  HR:           107 bpm. Exam Location:  Inpatient Procedure: Transesophageal Echo, 3D Echo, Color Doppler, Cardiac Doppler and            Saline Contrast Bubble Study Indications:     Stroke  History:         Patient has prior history of Echocardiogram examinations, most                  recent 09/18/2020. Risk Factors:Hypertension, Dyslipidemia and                  Lupus.  Sonographer:     Raquel Sarna Senior RDCS Referring Phys:  [place] Diagnosing Phys: Mertie Moores MD PROCEDURE: After discussion of the risks and benefits of a TEE, an informed consent was obtained from the patient. The transesophogeal probe was passed without difficulty through the esophogus of the patient. Local oropharyngeal anesthetic was provided with Cetacaine. Sedation performed by different physician. The patient was monitored while under deep sedation. Anesthestetic sedation was provided intravenously by Anesthesiology: 161mg  of Propofol, 40mg  of Lidocaine. The patient developed no complications during the procedure.  IMPRESSIONS  1. Left ventricular ejection fraction, by estimation, is 55 to 60%. The left ventricle has normal function.  2. Right ventricular systolic function is normal. The right ventricular size is normal.  3. No left atrial/left atrial appendage thrombus was detected.  4. The mean MS gradient is 14 mmhg.     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 This could be the cause of her stroke.     3-D images of the mitral valve were obtained. . The mitral valve is abnormal. Moderate to severe mitral valve regurgitation. Moderate to severe mitral stenosis. Moderate to severe mitral annular calcification.  5. Tricuspid valve regurgitation is moderate to severe.  6. The aortic valve is grossly normal. Aortic valve regurgitation is mild to moderate. FINDINGS  Left Ventricle: Left ventricular ejection fraction, by estimation, is 55 to 60%. The left ventricle has normal function. The left ventricular internal cavity size was normal in size. Right Ventricle: The right ventricular size is normal. No increase in right ventricular wall thickness. Right ventricular systolic function is normal. Left Atrium: Left atrial size was not well visualized. No left atrial/left atrial appendage thrombus was detected. Right Atrium: Right atrial size was not well visualized. Pericardium: There is no evidence of pericardial effusion. Mitral Valve: The mean MS  gradient is 14 mmhg. 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 This could be the cause of her stroke. 3-D images of the mitral valve were obtained. The mitral valve is abnormal. There is severe thickening of the mitral valve leaflet(s). There is severe calcification of the mitral valve leaflet(s). Moderate to severe mitral annular calcification. Moderate  to severe mitral valve regurgitation.  Moderate to severe mitral valve stenosis. MV peak gradient, 22.1 mmHg. The mean mitral valve gradient is 14.0 mmHg. Pulmonary venous flow is normal. Tricuspid Valve: The tricuspid valve is grossly normal. Tricuspid valve regurgitation is moderate to severe. Aortic Valve: The aortic valve is grossly normal. Aortic valve regurgitation is mild to moderate. Pulmonic Valve: The pulmonic valve was grossly normal. Pulmonic valve regurgitation is not visualized. Aorta: The aortic root and ascending aorta are structurally normal, with no evidence of dilitation. IAS/Shunts: No atrial level shunt detected by color flow Doppler. Agitated saline contrast was given intravenously to evaluate for intracardiac shunting. With bubble contrast, there were bubbles that appeared in the LA aproximately 8-9 cardiac cycles after injection. These are most likely due to a transpulmonary AVM or shunt. This is not c/w an ASD or PFO.  MITRAL VALVE MV Peak grad: 22.1 mmHg MV Mean grad: 14.0 mmHg MV Vmax:      2.35 m/s MV Vmean:     178.0 cm/s Mertie Moores MD Electronically signed by Mertie Moores MD Signature Date/Time: 09/20/2020/5:51:40 PM    Final     Medical Problem List and Plan: 1.  Vertigo, dizziness and unstable gait secondary to acute cerebellar vermis small infarct.  Status post loop recorder 11/4  -patient may shower  -ELOS/Goals: modI 10-12 days 2.  Antithrombotics: -DVT/anticoagulation: SCDs  -antiplatelet therapy: Aspirin 325 mg daily 3. Pain Management: Tylenol as needed 4. Mood: Paxil 40 mg daily, trazodone 100 mg nightly  -antipsychotic agents: N/A 5. Neuropsych: This patient is capable of making decisions on her own behalf. 6. Skin/Wound Care: Routine skin checks 7. Fluids/Electrolytes/Nutrition: Routine in and outs with follow-up chemistries 8.  End-stage renal disease.  Continue hemodialysis per renal services.  Outpatient fistulogram to be scheduled 9.  Lupus.  Chronic prednisone 10 mg daily.  Follow-up  outpatient rheumatology services 10.  Mitral stenosis.  Follow-up cardiology services. Loop recorder placed 11/4. 11.  Hypertension.  Lopressor 25 mg twice daily, Cardizem 120 mg nightly.  Monitor with increased mobility. Has had low Bps w/ lightheadedness 11/4 and medications were decreased as a result. 12.  Hyperlipidemia.  Lipitor 40mg  daily.  Lavon Paganini Angiulli, PA-C 09/22/2020

## 2020-09-22 NOTE — Progress Notes (Signed)
   09/22/20 1100  Assess: MEWS Score  Temp 98 F (36.7 C)  BP (!) 85/60  Pulse Rate 61  ECG Heart Rate 62  Resp 13  SpO2 99 %  O2 Device Room Air  Patient Activity (if Appropriate) In bed  Assess: MEWS Score  MEWS Temp 0  MEWS Systolic 1  MEWS Pulse 0  MEWS RR 1  MEWS LOC 0  MEWS Score 2  MEWS Score Color Yellow  Assess: if the MEWS score is Yellow or Red  Were vital signs taken at a resting state? Yes  Focused Assessment Change from prior assessment (see assessment flowsheet)  Early Detection of Sepsis Score *See Row Information* Low  MEWS guidelines implemented *See Row Information* Yes  Treat  MEWS Interventions Escalated (See documentation below);Other (Comment) (Assessed, MD notified)  Pain Scale 0-10  Pain Score 0  Take Vital Signs  Increase Vital Sign Frequency  Yellow: Q 2hr X 2 then Q 4hr X 2, if remains yellow, continue Q 4hrs  Notify: Provider  Provider Name/Title Dr. Ardelia Mems, Attending MD   Date Provider Notified 09/22/20  Time Provider Notified 1130 (Notified previously at 1029 for low BP)  Notification Type Page  Notification Reason Change in status  Response No new orders  Date of Provider Response 09/22/20  Time of Provider Response 1046

## 2020-09-22 NOTE — Plan of Care (Signed)

## 2020-09-22 NOTE — Progress Notes (Addendum)
Family Medicine Teaching Service Daily Progress Note Intern Pager: [phone]  Patient name: HARLAN VINAL Medical record number: [id number] Date of birth: 11-03-1976 Age: 44 y.o. 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  Acute CerebellarCVA Source continues to be unknown. TEE ruled out [place] endocarditis. Neurology lab work up for APLA negative. Cardiology recs for ILR to rule out afib.  This morning pt denies feeling dizzy.No FND on exam. - Neurology signed off -Will have loop recorder placed before discharge to CIR -PT/OT recs for CIR, CIR has been consulted, pending insurance approval -Aspirin325mg , statin 40 mg  Mitral stenosis, LA dilation Cardiology recommends OP cardiac monitor to r/o arrhythmias - Cardiology signed off - Will have loop recorder placed before discharge to CIR  HTN  BP today90/70. Feel lightheaded.Denies chest pain.  - decrease diltiazem to 120 mg and metoprolol 25mg . Hold if low BP.   Lupus, stable Follows with Dr. Elpidio Galea. States herLupusisin remission. 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  - Nephrologyconsulted appreciate recommendations - HDand medicationsper nephrology -Renal diet  - AM RFP  Thrombocytopenia Likely due to lupus. No active bleeding. Platelets stable. - continuing ASA 325  HLD -Continue atorvastatin  Diabetes  Not treated at home at  this time. Hgb A1c 7.2.  Surgars in high 100's since admission. Will not start any medications at this time given other acute issues.  -CBG monitoring w/ daily RFP - follow up outpatient   Anxiety Home medications includeParoxetine 40 mg daily. -Continue home medication  Insomnia Home medications includeTrazodone 100 mg nightly -Continue at bedtime  GERD Home medications includeProtonix 40 mg daily. - Continue home medication  FEN/GI: renal diet w/ fluid restriction to 1200 ml  PPx: SCDs  Disposition: Discharge to CIR pending insurance approval   Subjective:  Pt admits she started feeling exhausted and lightheaded during  PT yesterday and this feeling has continued. Yesterday her metoprolol was changed from 25 mg to 50 mg.  Her BP today is 92/72. She denies any dizziness today.  She denies headache, chest pain, and abdominal pain. She admits to SOB only when exerting herself during PT.    Objective: Temp:  [97.6 F (36.4 C)-98.8 F (37.1 C)] 98.5 F (36.9 C) (11/04 0400) Pulse Rate:  [64-66] 66 (11/04 0400) Resp:  [17-20] 17 (11/04 0031) BP: (92-115)/(68-86) 92/72 (11/04 0400) SpO2:  [97 %-100 %] 100 % (11/04 0400)  Physical Exam: General: Alert, in no acute distress Cardiovascular: holosystolic murmur, RRR, normal S1 and S2 Respiratory: CTA bilaterally  Abdomen: Non tender, non distended  Extremities: non-pitting edema of BLE, left worse than right  Laboratory: Recent Labs  Lab 09/19/20 0242 09/20/20 0511 09/21/20 1810  WBC 10.9* 10.3 11.9*  HGB 10.4* 10.3* 11.8*  HCT 33.6* 32.7* 38.0  PLT 74* 75* 106*   Recent Labs  Lab 09/17/20 1303 09/17/20 1311 09/18/20 0201 09/18/20 0201 09/19/20 0242 09/20/20 0511 09/21/20 1810  NA 136   < > 135   < > 134* 133* 137  K 5.3*   < > 3.2*   < >  4.3 5.2* 5.2*  CL 98   < > 96*   < > 97* 96* 101  CO2 24   < > 27   < > 25 22 20*  BUN 56*   < > 18   < > 46* 71* 50*  CREATININE 8.21*   < > 4.24*   < > 6.85*  9.01* 6.64*  CALCIUM 8.9   < > 8.3*   < > 8.1* 8.0* 8.1*  PROT 5.7*  --   --   --   --  5.3*  --   BILITOT 0.9  --   --   --   --  0.5  --   ALKPHOS 123  --   --   --   --  100  --   ALT 105*  --  84*  --   --  40  --   AST 78*  --   --   --   --  16  --   GLUCOSE 170*   < > 135*   < > 174* 156* 188*   < > = values in this interval not displayed.      Imaging/Diagnostic Tests: No results found.  Micheline Rough, Medical Student 09/22/2020, 8:27 AM OMS-IV AI, [place] Intern pager: [phone], text pages welcome  FPTS Upper-Level Resident Addendum I have independently interviewed and examined the patient. I have discussed the above with the original author and agree with their documentation. My edits for correction/addition/clarification are included. Please see also any attending notes.  [place] Service pager: [phone] (text pages welcome through [facility])  Wilber Oliphant, M.D.  PGY-3 09/22/2020 3:38 PM

## 2020-09-22 NOTE — Progress Notes (Signed)
Inpatient Rehab Admissions Coordinator:   I did receive insurance authorization for pt to admit to CIR. I do not have a bed available for this patient to admit today but will f/u for possible admit tomorrow, pending bed availability.   Shann Medal, PT, DPT Admissions Coordinator [phone] 09/22/20  3:16 PM

## 2020-09-22 NOTE — Progress Notes (Signed)
Occupational Therapy Treatment Patient Details Name: [name] MRN: [id number] DOB: 1976-02-22 Today's Date: 09/22/2020    History of present illness 44 yo admitted with dizziness with MRI demonstrating acute cerebellar infarct with chronic left parietal infarct. PMhx: ESRD, SLE, anemia, thrombocytopenia, HTN, HLD   OT comments  Pt eager to work with therapy and working towards OT goals this session. Orthostatic vitals taken (see vital flow sheets) and improving from earlier. Pt maintaining MAP for at least 65 through transition from supine to sitting EOB to standing. Pt required SUE support (hand held assist this session) for transfer to recliner. While in recliner Pt eager to participate in grooming. OT next session will plan on navigation/cognitive tasks next session as Pt was independent and driving PTA. CIR remains essential and beneficial due to Pt's young age, motivation, and ability to maximize safety and independence in ADL/IADL and functional transfers.   Follow Up Recommendations  CIR    Equipment Recommendations  Other (comment) (RW)    Recommendations for Other Services      Precautions / Restrictions Precautions Precautions: Fall Restrictions Weight Bearing Restrictions: No       Mobility Bed Mobility Overal bed mobility: Modified Independent             General bed mobility comments: Extra time, but able to come to transition supine <> sit EOB with HOB elevated safely  Transfers Overall transfer level: Needs assistance Equipment used: 1 person hand held assist Transfers: Stand Pivot Transfers   Stand pivot transfers: Min assist            Balance Overall balance assessment: Needs assistance Sitting-balance support: No upper extremity supported;Feet supported Sitting balance-Leahy Scale: Good Sitting balance - Comments: Able to maintain static sitting balance EOB without trunk sway or LOB.   Standing balance support: Single extremity  supported Standing balance-Leahy Scale: Fair                             ADL either performed or assessed with clinical judgement   ADL Overall ADL's : Needs assistance/impaired     Grooming: Wash/dry face;Wash/dry hands;Oral care;Min guard;Sitting Grooming Details (indicate cue type and reason): limited by BP - so in sitting for safety             Lower Body Dressing: Supervision/safety;Bed level Lower Body Dressing Details (indicate cue type and reason): able to don socks long sitting Toilet Transfer: Min Designer, jewellery Details (indicate cue type and reason): HHA today - Pt has been using cane with PT         Functional mobility during ADLs: Min guard;Rolling walker General ADL Comments: BP limiting this session     Vision       Perception     Praxis      Cognition Arousal/Alertness: Awake/alert Behavior During Therapy: WFL for tasks assessed/performed Overall Cognitive Status: Within Functional Limits for tasks assessed                                          Exercises     Shoulder Instructions       General Comments limited session due to BP, which is improving. Pt with lots of good questions about CIR and types of tasks that she will be accomplishing    Pertinent Vitals/ Pain  Pain Assessment: Faces Faces Pain Scale: No hurt Pain Intervention(s): Monitored during session  Home Living                                          Prior Functioning/Environment              Frequency  Min 2X/week        Progress Toward Goals  OT Goals(current goals can now be found in the care plan section)  Progress towards OT goals: Progressing toward goals  Acute Rehab OT Goals Patient Stated Goal: to be as independent as possible and keep driving OT Goal Formulation: With patient Time For Goal Achievement: 10/03/20 Potential to Achieve Goals: Good  Plan Discharge plan remains  appropriate;Frequency remains appropriate    Co-evaluation                 AM-PAC OT "6 Clicks" Daily Activity     Outcome Measure   Help from another person eating meals?: A Little Help from another person taking care of personal grooming?: A Little Help from another person toileting, which includes using toliet, bedpan, or urinal?: A Little Help from another person bathing (including washing, rinsing, drying)?: A Little Help from another person to put on and taking off regular upper body clothing?: A Little Help from another person to put on and taking off regular lower body clothing?: A Little 6 Click Score: 18    End of Session Equipment Utilized During Treatment: Gait belt  OT Visit Diagnosis: Unsteadiness on feet (R26.81);Muscle weakness (generalized) (M62.81)   Activity Tolerance Patient tolerated treatment well   Patient Left in chair;with call bell/phone within reach;with nursing/sitter in room   Nurse Communication Mobility status        Time: 1771-1657 OT Time Calculation (min): 31 min  Charges: OT General Charges $OT Visit: 1 Visit OT Treatments $Self Care/Home Management : 8-22 mins $Therapeutic Activity: 8-22 mins  Jesse Sans OTR/L Acute Rehabilitation Services Pager: [phone] Office: [place] 09/22/2020, 1:42 PM

## 2020-09-22 NOTE — Progress Notes (Signed)
INTERIM PROGRESS NOTE  Nurse Wilburn Cornelia recheck patient's blood pressure, much improved from previously now at 125/84.  We will plan to recheck in 3-4 hours.  Milus Banister, [employer], PGY-3 09/22/2020 11:44 PM

## 2020-09-22 NOTE — Progress Notes (Signed)
Physical Therapy Treatment Patient Details Name: [name] MRN: [id number] DOB: 02/26/1976 Today's Date: 09/22/2020    History of Present Illness 44 yo admitted with dizziness with MRI demonstrating acute cerebellar infarct with chronic left parietal infarct. PMhx: ESRD, SLE, anemia, thrombocytopenia, HTN, HLD    PT Comments    Pt limited this session by unsafe orthostatic BP changes, listed below. Nurse was notified. Unable to safely transfer, ambulate, or perform supine therapeutic exercise this date due to unsafe range of hypotension. Only able to come to sit EOB 1x. Pt did not experience signs or symptoms of hypotension and is eager to participate and improve. Will continue to follow acutely.    Follow Up Recommendations  CIR;Supervision for mobility/OOB     Equipment Recommendations  Other (comment) (TBD)    Recommendations for Other Services       Precautions / Restrictions Precautions Precautions: Fall Precaution Comments: orthostatic BP Restrictions Weight Bearing Restrictions: No    Mobility  Bed Mobility Overal bed mobility: Modified Independent Bed Mobility: Supine to Sit;Sit to Supine     Supine to sit: Modified independent (Device/Increase time);HOB elevated Sit to supine: Modified independent (Device/Increase time);HOB elevated   General bed mobility comments: Extra time, but able to come to transition supine <> sit EOB with HOB elevated safely  Transfers Overall transfer level: Needs assistance Equipment used: 1 person hand held assist Transfers: Stand Pivot Transfers   Stand pivot transfers: Min assist       General transfer comment: Unable to transfer due to orthostatic BP this date  Ambulation/Gait             General Gait Details: Unable to ambulate due to orthostatic BP this date   Stairs             Wheelchair Mobility    Modified Rankin (Stroke Patients Only) Modified Rankin (Stroke Patients Only) Pre-Morbid  Rankin Score: Slight disability Modified Rankin: Moderately severe disability     Balance Overall balance assessment: Needs assistance Sitting-balance support: No upper extremity supported;Feet supported Sitting balance-Leahy Scale: Good Sitting balance - Comments: Able to maintain static sitting balance EOB without trunk sway or LOB.   Standing balance support: Single extremity supported Standing balance-Leahy Scale: Fair                              Cognition Arousal/Alertness: Awake/alert Behavior During Therapy: WFL for tasks assessed/performed Overall Cognitive Status: Within Functional Limits for tasks assessed                                        Exercises      General Comments General comments (skin integrity, edema, etc.): BP this date: 90/70 with HOB slightly elevated at start of session, 80/48 upon coming to sit EOB, pt returned to supine with HOB slightly elevated as it was at start of session 80/48 then after several min in this position 76/30, placed bed in trendelenburg position with LEs elevated 84/41 then several min later 89/57, returned pt to [facility] elevated ~12 degrees and LEs elevated to pt's reported comfort level to end session 77/41 (nurse notified of all BP readings)      Pertinent Vitals/Pain Pain Assessment: No/denies pain Faces Pain Scale: No hurt Pain Intervention(s): Monitored during session;Limited activity within patient's tolerance    Home Living  Prior Function            PT Goals (current goals can now be found in the care plan section) Acute Rehab PT Goals Patient Stated Goal: to get better and go home PT Goal Formulation: With patient Time For Goal Achievement: 10/02/20 Potential to Achieve Goals: Good Progress towards PT goals: Not progressing toward goals - comment (BP limiting session this date)    Frequency    Min 4X/week      PT Plan Current plan remains  appropriate    Co-evaluation              AM-PAC PT "6 Clicks" Mobility   Outcome Measure  Help needed turning from your back to your side while in a flat bed without using bedrails?: None Help needed moving from lying on your back to sitting on the side of a flat bed without using bedrails?: A Little Help needed moving to and from a bed to a chair (including a wheelchair)?: A Little Help needed standing up from a chair using your arms (e.g., wheelchair or bedside chair)?: A Little Help needed to walk in hospital room?: A Little Help needed climbing 3-5 steps with a railing? : A Lot 6 Click Score: 18    End of Session   Activity Tolerance: Treatment limited secondary to medical complications (Comment) (orthostatic BP) Patient left: in bed;with call bell/phone within reach;with bed alarm set Nurse Communication: Other (comment) (BP during session) PT Visit Diagnosis: Other abnormalities of gait and mobility (R26.89);Difficulty in walking, not elsewhere classified (R26.2);Other symptoms and signs involving the nervous system (R29.898);Unsteadiness on feet (R26.81)     Time: 1771-1657 PT Time Calculation (min) (ACUTE ONLY): 16 min  Charges:  $Therapeutic Activity: 8-22 mins                     Moishe Spice, PT, DPT Acute Rehabilitation Services  Pager: [phone] Office: [place] 09/22/2020, 2:20 PM

## 2020-09-22 NOTE — PMR Pre-admission (Signed)
PMR Admission Coordinator Pre-Admission Assessment  Patient: [name] is an 44 y.o., female MRN: [id number] DOB: Aug 17, 1976 Height: _0  (162.6 cm) Weight: 58.5 kg              Insurance Information HMO: yes    PPO:      PCP:      IPA:      80/20:      OTHER:  PRIMARY: UHC Medicare      Policy#: [id number]      Subscriber: pt CM Name: [place]      Phone#: [phone]     Fax#: [phone] Pre-Cert#: [id number] auth for CIR provided by [place] at [place] with updates due to fax listed above on 11/10      Employer:  Benefits:  Phone #: [phone]     Name:  Eff. Date: 05/19/20     Deduct: $0      Out of Pocket Max: [phone] (met [phone])      Life Max: n/a  CIR: $1400 copay      SNF: 20 full days Outpatient: 80%     Co-Ins: 20% Home Health: 80%      Co-Ins: 20% DME: 80%     Co-Ins: 20% Providers:  SECONDARY:       Policy#:       Phone#:   Development worker, community:       Phone#:   The Engineer, petroleum" for patients in Inpatient Rehabilitation Facilities with attached "Privacy Act [place] Records" was provided and verbally reviewed with: Patient  Emergency Contact Information Contact Information    Name Relation Home Work Mobile   [place] Relative   [phone]   Aeriel, Boulay [phone]     [facility] Relative [phone]     [place] [phone]       Current Medical History  Patient Admitting Diagnosis: R cerebellar CVA   History of Present Illness: [name] is a 44 year old right-handed female with history of lupus, end-stage renal disease with hemodialysis, hypertension, depression and anxiety as well as 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  Patient lives alone 1 level home.  Independent with assistive device.  Patient does not drive and receive transportation via a neighbor.  She states she has a niece that assists as needed.  Presented 09/17/2020 with dizziness, nausea,  vomiting as well as stool incontinence.  She initially presented to [facility] urgent care with dizziness and provided with antiemetics diagnosed with classic vertigo discharged home.  Patient with progressive symptoms and patient came to [facility] for further evaluation.  MRI of the brain showed subcentimeter acute infarct involving the midline inferior vermis.  Chronic left parietal infarct.  CT angiogram of head and neck no hemodynamically significant stenosis in the neck.  No proximal intracranial vessel occlusion.  Admission chemistries potassium 5.3 glucose 170 BUN 56 creatinine 8.21 AST 78 ALT 105 alcohol negative.  WBC 12,100 hemoglobin 10.9 echocardiogram with severe left atrial dilation and mitral stenosis in the setting of significant annular calcifications.  TEE showed normal left ventricular function no AAS or thrombi.  Currently maintained on aspirin 325 mg for CVA prophylaxis and plan for loop recorder placement.  Hemodialysis ongoing as per renal services with plan schedule for outpatient fistulogram.  She is tolerating a regular consistency diet.  Therapy evaluations completed and patient was admitted for a comprehensive rehab program  Complete NIHSS TOTAL: 1 Glasgow Coma Scale Score: 15  Past Medical History  Past Medical History:  Diagnosis Date  .  Allergy   . Anemia   . Anxiety   . Arthritis   . Avascular necrosis of bone (HCC)    left tibial talus due to chronic prednisone use  . Cerebellar stroke ([place])   . Depression   . Dyspnea    too much  fluid  . ESRD (end stage renal disease) on dialysis ([place])    [place]  . GERD (gastroesophageal reflux disease)   . Headache(784.0)   . History of high blood pressure   . Hyperlipemia   . Hypertension   . Lupus ([place])   . Pneumonia   . Secondary hyperparathyroidism (of renal origin)     Family History  family history includes Asthma in her sister; COPD in her maternal uncle; Heart attack (age of onset: 50) in her  mother; Hypertension in her mother.  Prior Rehab/Hospitalizations:  Has the patient had prior rehab or hospitalizations prior to admission? No  Has the patient had major surgery during 100 days prior to admission? Yes  Current Medications   Current Facility-Administered Medications:  .  acetaminophen (TYLENOL) tablet 650 mg, 650 mg, Oral, Q6H PRN **OR** acetaminophen (TYLENOL) suppository 650 mg, 650 mg, Rectal, Q6H PRN, Nahser, Wonda Cheng, MD .  aspirin EC tablet 325 mg, 325 mg, Oral, Daily, Rosalin Hawking, MD, 325 mg at 09/22/20 1119 .  atorvastatin (LIPITOR) tablet 40 mg, 40 mg, Oral, Daily, Nahser, Wonda Cheng, MD, 40 mg at 09/22/20 1120 .  calcium acetate (Phos Binder) (PHOSLYRA) 667 MG/5ML oral solution 1,334 mg, 1,334 mg, Oral, TID WC, Nahser, Wonda Cheng, MD, 1,334 mg at 09/22/20 1424 .  Chlorhexidine Gluconate Cloth 2 % PADS 6 each, 6 each, Topical, Daily, Nahser, Wonda Cheng, MD, 6 each at 09/21/20 [phone] .  metoprolol tartrate (LOPRESSOR) tablet 25 mg, 25 mg, Oral, BID, Wilber Oliphant, MD .  pantoprazole (PROTONIX) EC tablet 40 mg, 40 mg, Oral, Daily, Nahser, Wonda Cheng, MD, 40 mg at 09/22/20 1124 .  PARoxetine (PAXIL) tablet 40 mg, 40 mg, Oral, Daily, Nahser, Wonda Cheng, MD, 40 mg at 09/22/20 1120 .  polyvinyl alcohol (LIQUIFILM TEARS) 1.4 % ophthalmic solution 2 drop, 2 drop, Both Eyes, Daily PRN, Nahser, Wonda Cheng, MD .  predniSONE (DELTASONE) tablet 10 mg, 10 mg, Oral, Q breakfast, Nahser, Wonda Cheng, MD, 10 mg at 09/22/20 0902 .  traZODone (DESYREL) tablet 100 mg, 100 mg, Oral, QHS, Nahser, Wonda Cheng, MD, 100 mg at 09/22/20 2116  Patients Current Diet:  Diet Order            Diet renal with fluid restriction Fluid restriction: 1200 mL Fluid; Room service appropriate? Yes; Fluid consistency: Thin  Diet effective now                 Precautions / Restrictions Precautions Precautions: Fall Precaution Comments: orthostatic BP Restrictions Weight Bearing Restrictions: No   Has the patient  had 2 or more falls or a fall with injury in the past year?No  Prior Activity Level Limited Community (1-2x/wk): independent prior to admit, living alone using [facility] for mobility, neighbors and family drive to appointments  Prior Functional Level Prior Function Level of Independence: Independent with assistive device(s) Comments: transportation via a neighbor  Self Care: Did the patient need help bathing, dressing, using the toilet or eating?  Independent  Indoor Mobility: Did the patient need assistance with walking from room to room (with or without device)? Independent  Stairs: Did the patient need assistance with internal or external stairs (with or  without device)? Independent  Functional Cognition: Did the patient need help planning regular tasks such as shopping or remembering to take medications? Independent  Home Assistive Devices / Equipment Home Equipment: Cane - single point, Grab bars - toilet, Grab bars - tub/shower, Hand held shower head  Prior Device Use: Indicate devices/aids used by the patient prior to current illness, exacerbation or injury? cane  Current Functional Level Cognition  Overall Cognitive Status: Within Functional Limits for tasks assessed Orientation Level: Oriented X4    Extremity Assessment (includes Sensation/Coordination)  Upper Extremity Assessment: Generalized weakness RUE Deficits / Details: noted to have tremor pt reports is increased  Lower Extremity Assessment: Defer to PT evaluation RLE Deficits / Details: strength WFL with decreased RAM of toe tapping unable to maintain coordination together, good performance with heel to shin LLE Deficits / Details: strength WFL with decreased RAM of toe tapping unable to maintain coordination together, good performance with heel to shin    ADLs  Overall ADL's : Needs assistance/impaired Grooming: Wash/dry face, Wash/dry hands, Oral care, Min guard, Sitting Grooming Details (indicate cue type and  reason): limited by BP - so in sitting for safety Upper Body Bathing: Min guard Lower Body Dressing: Supervision/safety, Bed level Lower Body Dressing Details (indicate cue type and reason): able to don socks long sitting Toilet Transfer: Min guard, Buyer, retail Details (indicate cue type and reason): HHA today - Pt has been using cane with PT Functional mobility during ADLs: Min guard, Rolling walker General ADL Comments: BP limiting this session    Mobility  Overal bed mobility: Modified Independent Bed Mobility: Supine to Sit, Sit to Supine Rolling: Modified independent (Device/Increase time) Supine to sit: Modified independent (Device/Increase time), HOB elevated Sit to supine: Modified independent (Device/Increase time), HOB elevated General bed mobility comments: Extra time, but able to come to transition supine <> sit EOB with HOB elevated safely    Transfers  Overall transfer level: Needs assistance Equipment used: 1 person hand held assist Transfers: Stand Pivot Transfers Sit to Stand: Min assist Stand pivot transfers: Min assist General transfer comment: Unable to transfer due to orthostatic BP this date    Ambulation / Gait / Stairs / Emergency planning/management officer  Ambulation/Gait Ambulation/Gait assistance: Herbalist (Feet): 50 Feet Assistive device: Straight cane Gait Pattern/deviations: Step-through pattern, Decreased stride length General Gait Details: Unable to ambulate due to orthostatic BP this date Gait velocity: decreased Gait velocity interpretation: <1.8 ft/sec, indicate of risk for recurrent falls    Posture / Balance Dynamic Sitting Balance Sitting balance - Comments: Able to maintain static sitting balance EOB without trunk sway or LOB. Static Standing Balance Tandem Stance - Right Leg: 5 Tandem Stance - Left Leg: 10 Rhomberg - Eyes Opened: 30 Balance Overall balance assessment: Needs assistance Sitting-balance support: No upper  extremity supported, Feet supported Sitting balance-Leahy Scale: Good Sitting balance - Comments: Able to maintain static sitting balance EOB without trunk sway or LOB. Standing balance support: Single extremity supported Standing balance-Leahy Scale: Fair Standing balance comment: Moderate trunk sway noted this date, requiring 1 UE support and minA at times to maintain balance. Tandem Stance - Right Leg: 5 Tandem Stance - Left Leg: 10 Rhomberg - Eyes Opened: 30    Special needs/care consideration Dialysis: Hemodialysis Tuesday, Thursday and Saturday     Previous Home Environment (from acute therapy documentation) Living Arrangements: Alone Available Help at Discharge: Family, Neighbor Type of Home: Apartment Home Layout: One level Home Access: Stairs to enter, Media planner  Bathroom Shower/Tub: Chiropodist: Standard Additional Comments: no animals  Discharge Living Setting Plans for Discharge Living Setting: Patient's home Type of Home at Discharge: Apartment Discharge Home Layout: One level Discharge Home Access: Elevator, Stairs to enter Discharge Bathroom Shower/Tub: Tub/shower unit Discharge Bathroom Toilet: Standard Discharge Bathroom Accessibility: Yes How Accessible: Accessible via walker Does the patient have any problems obtaining your medications?: No  Social/Family/Support Systems Anticipated Caregiver: neice, Aijalon Demuro Anticipated Caregiver's Contact Information: [phone] Ability/Limitations of Caregiver: supervision Caregiver Availability: 24/7 Discharge Plan Discussed with Primary Caregiver: Yes (pt discussed prior to consult) Is Caregiver In Agreement with Plan?: Yes Does Caregiver/Family have Issues with Lodging/Transportation while Pt is in Rehab?: No   Goals Patient/Family Goal for Rehab: PT/OT intermittent supervision, no SLP Expected length of stay: 7-10 days Additional Information: HD TRS Pt/Family Agrees to Admission and  willing to participate: Yes Program Orientation Provided & Reviewed with Pt/Caregiver Including Roles  & Responsibilities: Yes  Barriers to Discharge: Insurance for SNF coverage   Decrease burden of Care through IP rehab admission: n/a  Possible need for SNF placement upon discharge: Not anticipated  Patient Condition: This patient's medical and functional status has changed since the consult dated: 11/3 in which the Rehabilitation Physician determined and documented that the patient's condition is appropriate for intensive rehabilitative care in an inpatient rehabilitation facility. See "History of Present Illness" (above) for medical update. Functional changes are: min assist. Patient's medical and functional status update has been discussed with the Rehabilitation physician and patient remains appropriate for inpatient rehabilitation. Will admit to inpatient rehab today.  Preadmission Screen Completed By:  Michel Santee, PT, DPT 09/23/2020 12:42 PM ______________________________________________________________________   Discussed status with Dr. Ranell Patrick on 09/23/20 at  12:42 PM  and received approval for admission today.  Admission Coordinator:  Michel Santee, PT, DPT time 12:42 PM Sudie Grumbling 09/23/20

## 2020-09-22 NOTE — Progress Notes (Signed)
Inpatient Rehab Admissions Coordinator:   Awaiting determination from insurance company regarding auth for CIR.   Shann Medal, PT, DPT Admissions Coordinator [phone] 09/22/20  9:48 AM

## 2020-09-23 ENCOUNTER — Encounter (HOSPITAL_COMMUNITY): Payer: Self-pay | Admitting: Internal Medicine

## 2020-09-23 ENCOUNTER — Inpatient Hospital Stay (HOSPITAL_COMMUNITY)
Admission: RE | Admit: 2020-09-23 | Discharge: 2020-09-30 | DRG: 091 | Disposition: A | Discharge status: Home Health | Payer: Medicare Other | Source: Intra-hospital | Attending: Physical Medicine and Rehabilitation | Admitting: Physical Medicine and Rehabilitation

## 2020-09-23 ENCOUNTER — Other Ambulatory Visit: Payer: Self-pay

## 2020-09-23 DIAGNOSIS — Z7952 Long term (current) use of systemic steroids: Secondary | ICD-10-CM | POA: Diagnosis not present

## 2020-09-23 DIAGNOSIS — I120 Hypertensive chronic kidney disease with stage 5 chronic kidney disease or end stage renal disease: Secondary | ICD-10-CM | POA: Diagnosis present

## 2020-09-23 DIAGNOSIS — Z79891 Long term (current) use of opiate analgesic: Secondary | ICD-10-CM

## 2020-09-23 DIAGNOSIS — M879 Osteonecrosis, unspecified: Secondary | ICD-10-CM | POA: Diagnosis present

## 2020-09-23 DIAGNOSIS — K219 Gastro-esophageal reflux disease without esophagitis: Secondary | ICD-10-CM | POA: Diagnosis present

## 2020-09-23 DIAGNOSIS — R34 Anuria and oliguria: Secondary | ICD-10-CM | POA: Diagnosis present

## 2020-09-23 DIAGNOSIS — Z8673 Personal history of transient ischemic attack (TIA), and cerebral infarction without residual deficits: Secondary | ICD-10-CM

## 2020-09-23 DIAGNOSIS — Z7902 Long term (current) use of antithrombotics/antiplatelets: Secondary | ICD-10-CM | POA: Diagnosis not present

## 2020-09-23 DIAGNOSIS — E875 Hyperkalemia: Secondary | ICD-10-CM | POA: Diagnosis not present

## 2020-09-23 DIAGNOSIS — N186 End stage renal disease: Secondary | ICD-10-CM | POA: Diagnosis present

## 2020-09-23 DIAGNOSIS — Z881 Allergy status to other antibiotic agents status: Secondary | ICD-10-CM | POA: Diagnosis not present

## 2020-09-23 DIAGNOSIS — E785 Hyperlipidemia, unspecified: Secondary | ICD-10-CM | POA: Diagnosis present

## 2020-09-23 DIAGNOSIS — Z7982 Long term (current) use of aspirin: Secondary | ICD-10-CM | POA: Diagnosis not present

## 2020-09-23 DIAGNOSIS — D631 Anemia in chronic kidney disease: Secondary | ICD-10-CM | POA: Diagnosis present

## 2020-09-23 DIAGNOSIS — I342 Nonrheumatic mitral (valve) stenosis: Secondary | ICD-10-CM | POA: Diagnosis present

## 2020-09-23 DIAGNOSIS — M79602 Pain in left arm: Secondary | ICD-10-CM | POA: Diagnosis not present

## 2020-09-23 DIAGNOSIS — Z992 Dependence on renal dialysis: Secondary | ICD-10-CM | POA: Diagnosis not present

## 2020-09-23 DIAGNOSIS — G4700 Insomnia, unspecified: Secondary | ICD-10-CM | POA: Diagnosis present

## 2020-09-23 DIAGNOSIS — R2689 Other abnormalities of gait and mobility: Principal | ICD-10-CM | POA: Diagnosis present

## 2020-09-23 DIAGNOSIS — F419 Anxiety disorder, unspecified: Secondary | ICD-10-CM | POA: Diagnosis present

## 2020-09-23 DIAGNOSIS — Z79899 Other long term (current) drug therapy: Secondary | ICD-10-CM

## 2020-09-23 DIAGNOSIS — F32A Depression, unspecified: Secondary | ICD-10-CM | POA: Diagnosis present

## 2020-09-23 DIAGNOSIS — Z882 Allergy status to sulfonamides status: Secondary | ICD-10-CM | POA: Diagnosis not present

## 2020-09-23 DIAGNOSIS — Z888 Allergy status to other drugs, medicaments and biological substances status: Secondary | ICD-10-CM

## 2020-09-23 DIAGNOSIS — N2581 Secondary hyperparathyroidism of renal origin: Secondary | ICD-10-CM | POA: Diagnosis present

## 2020-09-23 DIAGNOSIS — M329 Systemic lupus erythematosus, unspecified: Secondary | ICD-10-CM | POA: Diagnosis present

## 2020-09-23 DIAGNOSIS — Z981 Arthrodesis status: Secondary | ICD-10-CM | POA: Diagnosis not present

## 2020-09-23 LAB — RENAL FUNCTION PANEL
Albumin: 2.8 g/dL — ABNORMAL LOW (ref 3.5–5.0)
Anion gap: 16 — ABNORMAL HIGH (ref 5–15)
BUN: 77 mg/dL — ABNORMAL HIGH (ref 6–20)
CO2: 20 mmol/L — ABNORMAL LOW (ref 22–32)
Calcium: 8 mg/dL — ABNORMAL LOW (ref 8.9–10.3)
Chloride: 100 mmol/L (ref 98–111)
Creatinine, Ser: 8.83 mg/dL — ABNORMAL HIGH (ref 0.44–1.00)
GFR, Estimated: 5 mL/min — ABNORMAL LOW (ref >60)
Glucose, Bld: 155 mg/dL — ABNORMAL HIGH (ref 70–99)
Phosphorus: 7.9 mg/dL — ABNORMAL HIGH (ref 2.5–4.6)
Potassium: 5.5 mmol/L — ABNORMAL HIGH (ref 3.5–5.1)
Sodium: 136 mmol/L (ref 135–145)

## 2020-09-23 LAB — CBC
HCT: 34.4 % — ABNORMAL LOW (ref 36.0–46.0)
Hemoglobin: 12.7 g/dL (ref 12.0–15.0)
MCH: 33.7 pg (ref 26.0–34.0)
MCHC: 36.9 g/dL — ABNORMAL HIGH (ref 30.0–36.0)
MCV: 91.2 fL (ref 80.0–100.0)
Platelets: 95 10*3/uL — ABNORMAL LOW (ref 150–400)
RBC: 3.77 MIL/uL — ABNORMAL LOW (ref 3.87–5.11)
RDW: 18.6 % — ABNORMAL HIGH (ref 11.5–15.5)
WBC: 11.9 10*3/uL — ABNORMAL HIGH (ref 4.0–10.5)
nRBC: 0 % (ref 0.0–0.2)

## 2020-09-23 LAB — ANTINUCLEAR ANTIBODIES, IFA: ANA Ab, IFA: NEGATIVE

## 2020-09-23 LAB — MPO/PR-3 (ANCA) ANTIBODIES
ANCA Proteinase 3: <3.5 U/mL (ref 0.0–3.5)
Myeloperoxidase Abs: <9 U/mL (ref 0.0–9.0)

## 2020-09-23 MED ORDER — CHLORHEXIDINE GLUCONATE CLOTH 2 % EX PADS: 6.0000 | MEDICATED_PAD | Freq: Every day | CUTANEOUS | Status: DC

## 2020-09-23 MED ORDER — ASPIRIN 325 MG PO TBEC: 325.0000 mg | DELAYED_RELEASE_TABLET | Freq: Every day | ORAL | 0 refills | Status: AC

## 2020-09-23 MED ORDER — METOPROLOL TARTRATE 25 MG PO TABS
25.0000 mg | ORAL_TABLET | Freq: Two times a day (BID) | ORAL | Status: DC
Start: 2020-09-23 — End: 2020-09-29

## 2020-09-23 MED ORDER — SODIUM ZIRCONIUM CYCLOSILICATE 10 G PO PACK
10.0000 g | PACK | Freq: Once | ORAL | Status: AC
  Administered 2020-09-23: 10 g via ORAL
  Filled 2020-09-23: qty 1

## 2020-09-23 MED ORDER — ACETAMINOPHEN 325 MG PO TABS
650.0000 mg | ORAL_TABLET | Freq: Four times a day (QID) | ORAL | Status: DC | PRN
  Administered 2020-09-29: 650 mg via ORAL
  Filled 2020-09-23: qty 2

## 2020-09-23 MED ORDER — METOPROLOL TARTRATE 25 MG PO TABS
25.0000 mg | ORAL_TABLET | Freq: Two times a day (BID) | ORAL | Status: DC
  Administered 2020-09-23 – 2020-09-29 (×11): 25 mg via ORAL
  Filled 2020-09-23 (×14): qty 1

## 2020-09-23 MED ORDER — ATORVASTATIN CALCIUM 40 MG PO TABS
40.0000 mg | ORAL_TABLET | Freq: Every day | ORAL | Status: DC
  Administered 2020-09-24 – 2020-09-30 (×7): 40 mg via ORAL
  Filled 2020-09-23 (×7): qty 1

## 2020-09-23 MED ORDER — ACETAMINOPHEN 650 MG RE SUPP: 650.0000 mg | Freq: Four times a day (QID) | RECTAL | Status: DC | PRN

## 2020-09-23 MED ORDER — CALCIUM ACETATE (PHOS BINDER) 667 MG/5ML PO SOLN
1334.0000 mg | Freq: Three times a day (TID) | ORAL | Status: DC
  Administered 2020-09-24 (×2): 1334 mg via ORAL
  Filled 2020-09-23 (×3): qty 10

## 2020-09-23 MED ORDER — ASPIRIN EC 325 MG PO TBEC
325.0000 mg | DELAYED_RELEASE_TABLET | Freq: Every day | ORAL | Status: DC
  Administered 2020-09-24 – 2020-09-30 (×7): 325 mg via ORAL
  Filled 2020-09-23 (×7): qty 1

## 2020-09-23 MED ORDER — PREDNISONE 10 MG PO TABS
10.0000 mg | ORAL_TABLET | Freq: Every day | ORAL | Status: DC
  Administered 2020-09-24 – 2020-09-30 (×7): 10 mg via ORAL
  Filled 2020-09-23 (×7): qty 1

## 2020-09-23 MED ORDER — PANTOPRAZOLE SODIUM 40 MG PO TBEC
40.0000 mg | DELAYED_RELEASE_TABLET | Freq: Every day | ORAL | Status: DC
  Administered 2020-09-24 – 2020-09-30 (×7): 40 mg via ORAL
  Filled 2020-09-23 (×7): qty 1

## 2020-09-23 MED ORDER — HEPARIN SODIUM (PORCINE) 1000 UNIT/ML IJ SOLN
INTRAMUSCULAR | Status: AC
  Administered 2020-09-23: 2000 [IU]
  Filled 2020-09-23: qty 6

## 2020-09-23 MED ORDER — PAROXETINE HCL 20 MG PO TABS
40.0000 mg | ORAL_TABLET | Freq: Every day | ORAL | Status: DC
  Administered 2020-09-24 – 2020-09-30 (×7): 40 mg via ORAL
  Filled 2020-09-23 (×7): qty 2

## 2020-09-23 MED ORDER — TRAZODONE HCL 50 MG PO TABS
100.0000 mg | ORAL_TABLET | Freq: Every day | ORAL | Status: DC
  Administered 2020-09-23 – 2020-09-29 (×7): 100 mg via ORAL
  Filled 2020-09-23 (×7): qty 2

## 2020-09-23 MED ORDER — POLYVINYL ALCOHOL 1.4 % OP SOLN
2.0000 [drp] | Freq: Every day | OPHTHALMIC | Status: DC | PRN
  Filled 2020-09-23: qty 15

## 2020-09-23 MED ORDER — ATORVASTATIN CALCIUM 40 MG PO TABS
40.0000 mg | ORAL_TABLET | Freq: Every day | ORAL | Status: DC
Start: 2020-09-23 — End: 2020-09-29

## 2020-09-23 MED ORDER — CALCIUM ACETATE (PHOS BINDER) 667 MG/5ML PO SOLN
1334.0000 mg | Freq: Three times a day (TID) | ORAL | Status: DC
  Filled 2020-09-23: qty 10

## 2020-09-23 NOTE — Progress Notes (Addendum)
Family Medicine Teaching Service Daily Progress Note Intern Pager: [phone]  Patient name: [name] Medical record number: [id number] Date of birth: 1976/08/12 Age: 44 y.o. Gender: female  Primary Care Provider: Leeanne Rio, MD Consultants: nephrology, cardiology consulted-signed off, neurology consulted-signed off   Code Status: Full  Pt Overview and Major Events to Date:  Admitted 10/30 09/20/20: TEE - negative to [name] endocarditis   Assessment and Plan: [name] a 44 y.o.femalepresenting with dizziness accompanied with vomiting and one episode of diarrheal incontinence.Found to have acute cerebellar CVA.PMH is significant for lupus, ESRD, HTN, and HLD.  Acute CerebellarCVA Source continues to be unknown. TEE ruled out [name] endocarditis. Neurology lab work up for APLA negative. Cardiology placed ILR yesterday to rule out afib. This morning pt denies feeling dizzy.No FND on exam. - Neurology signed off -Had loop recorder placed yesterday - CIR received insurance approval , bed received.  Discharged today to CIR -Aspirin325mg , statin 40 mg  Mitral stenosis, LA dilation Cardiology recommends OP cardiac monitor to r/o arrhythmias - Cardiology signed off -  loop recorder placed yesterday  HTN  Low BPs yesterday, pt felt lightheaded, BP medications held yesterday. Cause not identified, possibly due to adding metoprolol 50 mg bid. BP [id number]. Denies feeling lightheaded.Denies chest pain.  -decrease diltiazem to 120 mg and metoprolol 25mg . Hold if low BP.   Lupus, stable Follows with Dr. Elpidio Galea. States herLupusisin remission. 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 Dialysis held yesterday due to low BP.  Potassium was 5.7 yesterday, Lokelma given, potassium 5.5 this morning. In dialysis this morning.  - Nephrologyconsulted appreciate  recommendations - HDand medicationsper nephrology -Renal diet    Thrombocytopenia Likely due to lupus. No active bleeding. Platelets stable. - continuing ASA 325  HLD -Continue atorvastatin  Diabetes  Not treated at home at this time. Hgb A1c 7.2.  Surgars in high 100's since admission. Will not start any medications at this time given other acute issues.  -CBG monitoring w/ daily RFP - follow up outpatient   Anxiety Home medications includeParoxetine 40 mg daily. -Continue home medication  Insomnia Home medications includeTrazodone 100 mg nightly -Continue at bedtime  GERD Home medications includeProtonix 40 mg daily. - Continue home medication   FEN/GI: Renal diet PPx: SCDs  Disposition: Discharged to CIR today, medically stable for CIR  Subjective:  Pt states she feels well today. She denies dizziness, light headedness, chest pain, SOB, headache and abdominal pain. She is ready to be discharged to CIR when they have a bed available.   Objective: Temp:  [97.7 F (36.5 C)-98.1 F (36.7 C)] 97.8 F (36.6 C) (11/05 0316) Pulse Rate:  [59-68] 59 (11/05 0815) Resp:  [12-19] 13 (11/05 0815) BP: (85-125)/(60-84) 120/81 (11/05 0815) SpO2:  [98 %-100 %] 100 % (11/05 0815) Physical Exam: General: Alert, in no acute distress Cardiovascular: holosystolic murmur, RRR, normal S1 and S2 Respiratory: CTA bilaterally  Abdomen: Non tender, non distended  Extremities: non-pitting edema of BLE, left worse than right  Laboratory: Recent Labs  Lab 09/21/20 1810 09/22/20 1230 09/23/20 0741  WBC 11.9* 12.4* 11.9*  HGB 11.8* 12.3 12.7  HCT 38.0 39.2 34.4*  PLT 106* 99* 95*   Recent Labs  Lab 09/17/20 1303 09/17/20 1311 09/18/20 0201 09/19/20 0242 09/20/20 0511 09/20/20 0511 09/21/20 1810 09/22/20 0957 09/23/20 0154  NA 136   < > 135   < > 133*   < > 137 136  136  K 5.3*   < > 3.2*   < > 5.2*   < > 5.2* 5.7* 5.5*  CL 98   < > 96*   < > 96*   <  > 101 98 100  CO2 24   < > 27   < > 22   < > 20* 21* 20*  BUN 56*   < > 18   < > 71*   < > 50* 65* 77*  CREATININE 8.21*   < > 4.24*   < > 9.01*   < > 6.64* 7.64* 8.83*  CALCIUM 8.9   < > 8.3*   < > 8.0*   < > 8.1* 8.2* 8.0*  PROT 5.7*  --   --   --  5.3*  --   --   --   --   BILITOT 0.9  --   --   --  0.5  --   --   --   --   ALKPHOS 123  --   --   --  100  --   --   --   --   ALT 105*  --  84*  --  40  --   --   --   --   AST 78*  --   --   --  16  --   --   --   --   GLUCOSE 170*   < > 135*   < > 156*   < > 188* 103* 155*   < > = values in this interval not displayed.      Imaging/Diagnostic Tests: EP PPM/ICD IMPLANT  Result Date: 09/22/2020 SURGEON:  Thompson Grayer, MD   PREPROCEDURE DIAGNOSIS:  Cryptogenic Stroke   POSTPROCEDURE DIAGNOSIS:  Cryptogenic Stroke    PROCEDURES:  1. Implantable loop recorder implantation   INTRODUCTION:  [name] is a 44 y.o. female with a history of unexplained stroke who presents today for implantable loop implantation.  The patient has had a cryptogenic stroke.  Despite an extensive workup by neurology, no reversible causes have been identified.  she has worn telemetry during which she did not have arrhythmias.  There is significant concern for possible atrial fibrillation as the cause for the patients stroke.  The patient therefore presents today for implantable loop implantation.   DESCRIPTION OF PROCEDURE:  Informed written consent was obtained.  The patient required no sedation for the procedure today.  The patients left chest was prepped and draped. Mapping over the patient's chest was performed to identify the appropriate ILR site.  This area was found to be the left  parasternal region over the 3rd-4th intercostal space.  The skin overlying this region was infiltrated with lidocaine for local analgesia.  A 0.5-cm incision was made at the implant site.  A subcutaneous ILR pocket was fashioned using a combination of sharp and blunt dissection.  A  Medtronic Reveal Linq model [id number] implantable loop recorder was then placed into the pocket R waves were very prominent and measured > 0.2 mV. EBL<1 ml.  Steri- Strips and a sterile dressing were then applied.  There were no early apparent complications.   CONCLUSIONS:  1. Successful implantation of a Medtronic Reveal LINQ implantable loop recorder for cryptogenic stroke  2. No early apparent complications. Thompson Grayer MD, [facility] 09/22/2020 3:04 PM   DG Chest Port 1 View  Result Date: 09/22/2020 CLINICAL DATA:  Leukocytosis EXAM: PORTABLE CHEST 1 VIEW COMPARISON:  Radiograph 09/17/2020 FINDINGS: Dual lumen right IJ catheter tip approximates the right atrium in similar position to prior. Implantable loop recorder projects over the left heart. Telemetry leads overlie the chest. Bandlike areas of opacity in the lung bases likely reflecting combination of subsegmental atelectasis or scarring. Suspect some small bilateral effusions with some adjacent passive atelectasis. Underlying airspace disease would be difficult to exclude though no other focal consolidative process is seen. Mild central vascular congestion and cuffing is present which could bowl I some mild pulmonary edema. Cardiac contours are unchanged from prior with a mildly calcified aorta. 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 Vascular stenting in the left axilla without acute complication of the visible segment. Remaining soft tissues are free of acute abnormality. IMPRESSION: 1. Bandlike areas of opacity in the lung bases likely reflecting combination of subsegmental atelectasis or scarring. 2. Suspect least mild interstitial edema and small bilateral effusions with some adjacent passive atelectasis. Underlying airspace disease/infection cannot be fully excluded on radiography. Electronically Signed   By: Lovena Le M.D.   On: 09/22/2020 20:20     Micheline Rough, Medical Student 09/23/2020, 8:27 AM  [facility], [place] Intern pager: [phone], text pages welcome  Resident Attestation  I saw and evaluated the patient, performing the key elements of the service.I  personally performed or re-performed the history, physical exam, and medical decision making activities of this service and have verified that the service and findings are accurately documented in the student's note. I developed the management plan that is described in the medical student's note, and I agree with the content, with my edits above.    Gifford Shave, PGY2

## 2020-09-23 NOTE — Progress Notes (Signed)
Pt was transferred from #w on w/c, accommodated by staff. Pt alert and responsive. No acute distress. Denies any pain or discomfort. Belongings ( clothes, eyeglasses, cell phone.) with the pt. Pt oriented to the unit .Education materials and schedule were given to pt.

## 2020-09-23 NOTE — Progress Notes (Signed)
Physical Medicine and Rehabilitation Consult Reason for Consult: Dizziness/vertigo with decreased functional mobility Referring Physician: [facility]ad   HPI: [name] is a 44 y.o. 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  History taken from chart review and patient.  Patient lives alone.  1 level home.  Independent with assistive device.  Patient does not drive and receives transportation via a neighbor.  She states she has a niece that came up with her if needed.  She presented on 09/17/20 with dizziness, nausea, vomitting as well as stool incontinence.  She initially presented to [facility] urgent care dizziness and provided antiemetics, diagnosed with classic vertigo discharged to home.  MRI of the brain showed subcentimeter acute infarct involving the midline inferior vermis.  Chronic left parietal infarct.  CT angiogram of head and neck no hemodynamically significant stenosis in the neck.  No proximal intracranial vessel occlusion.  Admission chemistries potassium 5.3, glucose 170, BUN 56, creatinine 8.21, AST 78, ALT 105, alcohol negative, WBC 12,100, hemoglobin 10.9.  Echocardiogram with severe left atrial dilation and mitral stenosis in the setting of significant annular calcifications..  TEE showed normal left ventricular function no AAS or thrombi.  Currently maintained on aspirin for CVA prophylaxis.  Hemodialysis ongoing as per renal services.  Patient is scheduled for an outpatient fistulogram.  She is tolerating a regular diet.  Therapy evaluations completed with recommendations of physical medicine rehab consult.   Review of Systems  Constitutional: Positive for malaise/fatigue. Negative for chills and fever.  HENT: Negative for hearing loss.   Eyes: Negative for blurred vision and double vision.  Respiratory: Negative for cough and shortness  of breath.   Cardiovascular: Positive for leg swelling. Negative for chest pain and palpitations.  Gastrointestinal: Positive for nausea and vomiting.       GERD  Genitourinary: Negative for dysuria, flank pain and hematuria.  Musculoskeletal: Positive for joint pain and myalgias.  Skin: Negative for rash.  Neurological: Positive for dizziness, weakness and headaches. Negative for sensory change.  Psychiatric/Behavioral: Positive for depression.       Anxiety  All other systems reviewed and are negative.       Past Medical History:  Diagnosis Date  . Allergy    . Anemia    . Anxiety    . Arthritis    . Avascular necrosis of bone (HCC)      left tibial talus due to chronic prednisone use  . Cerebellar stroke ([place])    . Depression    . Dyspnea      too much  fluid  . ESRD (end stage renal disease) on dialysis ([place])      tthsat [place]  . GERD (gastroesophageal reflux disease)    . Headache(784.0)    . History of high blood pressure    . Hyperlipemia    . Hypertension    . Lupus ([place])    . Pneumonia    . Secondary hyperparathyroidism (of renal origin)           Past Surgical History:  Procedure Laterality Date  . A/V FISTULAGRAM Left 12/02/2017    Procedure: A/V FISTULAGRAM - Left upper;  Surgeon: Angelia Mould, MD;  Location: [place] [facility];  Service: Cardiovascular;  Laterality: Left;  . AV FISTULA PLACEMENT Left    . AV FISTULA PLACEMENT Right 01/26/2014    Procedure: ARTERIOVENOUS (  AV) FISTULA CREATION; ultrasound guided;  Surgeon: Conrad Redan, MD;  Location: [place];  Service: Vascular;  Laterality: Right;  . AV FISTULA PLACEMENT Right 04/25/2020    Procedure: RIGHT UPPER ARM  BASILIC VEIN ARTERIOVENOUS (AV) FISTULA;  Surgeon: [name], MD;  Location: [place];  Service: Vascular;  Laterality: Right;  . BASCILIC VEIN TRANSPOSITION Right 07/01/2020    Procedure: SECOND STAGE BASCILIC VEIN TRANSPOSITION RIGHT UPPER EXTREMITY;  Surgeon: [name], MD;  Location: [facility] OR;  Service: Vascular;  Laterality: Right;  . ESOPHAGOGASTRODUODENOSCOPY N/A 03/17/2016    Procedure: ESOPHAGOGASTRODUODENOSCOPY (EGD);  Surgeon: Irene Shipper, MD;  Location: [facility] ENDOSCOPY;  Service: Endoscopy;  Laterality: N/A;  . FISTULOGRAM Left 09/24/2019    Procedure: FISTULOGRAM LEFT ARM;  Surgeon: [name], MD;  Location: [place];  Service: Vascular;  Laterality: Left;  . HEMATOMA EVACUATION Left 10/09/2013    Procedure: EVACUATION HEMATOMA;  Surgeon: Angelia Mould, MD;  Location: [place];  Service: Vascular;  Laterality: Left;  . HEMATOMA EVACUATION Left 09/24/2019    Procedure: EVACUATION HEMATOMA  LEFT ARM;  Surgeon: [name], MD;  Location: [place];  Service: Vascular;  Laterality: Left;  . INCISION AND DRAINAGE ABSCESS        gluteal  . INSERTION OF DIALYSIS CATHETER N/A 10/09/2013    Procedure: INSERTION OF DIALYSIS CATHETER; ULTRASOUND GUIDED;  Surgeon: Angelia Mould, MD;  Location: [place];  Service: Vascular;  Laterality: N/A;  . INSERTION OF DIALYSIS CATHETER Left 09/24/2019    Procedure: INSERTION OF TUNNELLED DIALYSIS CATHETER - PALINDROME 15FR X 28CM;  Surgeon: [name], MD;  Location: [place];  Service: Vascular;  Laterality: Left;  . INSERTION OF DIALYSIS CATHETER Right 03/15/2020    Procedure: Insertion Of Dialysis Catheter;  Surgeon: Angelia Mould, MD;  Location: [place];  Service: Cardiovascular;  Laterality: Right;  . PERIPHERAL VASCULAR BALLOON ANGIOPLASTY Left 12/02/2017    Procedure: PERIPHERAL VASCULAR BALLOON ANGIOPLASTY;  Surgeon: Angelia Mould, MD;  Location: [place] [facility];  Service: Cardiovascular;  Laterality: Left;  . REVISION OF ARTERIOVENOUS GORETEX GRAFT Left 04/03/2019    Procedure: REVISION OF ARTERIOVENOUS GORETEX GRAFT PSEUDOANEURYSM;  Surgeon: Angelia Mould, MD;  Location: [place];  Service: Vascular;  Laterality: Left;  . SHUNTOGRAM N/A 01/07/2014     Procedure: fistulogram with possibe venoplasty left upper arm avg;  Surgeon: Angelia Mould, MD;  Location: [facility];  Service: Cardiovascular;  Laterality: N/A;  . THROMBECTOMY AND REVISION OF ARTERIOVENTOUS (AV) GORETEX  GRAFT Left 03/15/2020    Procedure: ATTEMPTED THROMBECTOMY OF LEFT ARM ARTERIOVENTOUS (AV) GORETEX  GRAFT;  Surgeon: Angelia Mould, MD;  Location: [place];  Service: Cardiovascular;  Laterality: Left;  . tibiocalcaneal fusion  left Left    . ULTRASOUND GUIDANCE FOR VASCULAR ACCESS   09/24/2019    Procedure: Ultrasound Guidance For Vascular Access;  Surgeon: [name], MD;  Location: [facility] OR;  Service: Vascular;;         Family History  Problem Relation Age of Onset  . Asthma Sister    . Hypertension Mother    . Heart attack Mother 73        died at 15  . COPD Maternal Uncle          prostate  . Mental illness Neg Hx      Social History:  reports that she has never smoked. She has never used smokeless tobacco. She reports  that she does not drink alcohol and does not use drugs. Allergies:       Allergies  Allergen Reactions  . Amlodipine Hives and Swelling  . Sulfa Antibiotics Hives and Itching  . Vancomycin Hives and Itching  . Venlafaxine Other (See Comments)      Emotional    Medications Prior to Admission  Medication Sig Dispense Refill  . acetaminophen (TYLENOL) 500 MG tablet Take 1,000 mg by mouth every 6 (six) hours as needed for moderate pain or headache.       . b complex-vitamin c-folic acid (NEPHRO-VITE) 0.8 MG TABS tablet Take 1 tablet by mouth daily at 12 noon.      . calcium acetate, Phos Binder, (PHOSLYRA) 667 MG/5ML SOLN Take 2,001 mg by mouth See admin instructions. Take 15 mls (2001 mg) by mouth with each meal and snack - up to 9 times daily      . Carboxymethylcellul-Glycerin (LUBRICATING EYE DROPS OP) Place 1 drop into both eyes daily as needed (dry eyes).      Marland Kitchen diltiazem (TIAZAC) 240 MG 24 hr capsule Take 240 mg by  mouth at bedtime.       . hydrALAZINE (APRESOLINE) 25 MG tablet Take 25 mg by mouth in the morning and at bedtime.      . pantoprazole (PROTONIX) 40 MG tablet Take 1 tablet (40 mg total) by mouth daily. 30 tablet 3  . PARoxetine (PAXIL) 40 MG tablet Take 40 mg by mouth daily.      . predniSONE (DELTASONE) 10 MG tablet Take 1 tablet (10 mg total) by mouth daily with breakfast. 90 tablet 3  . traZODone (DESYREL) 100 MG tablet Take 100 mg by mouth at bedtime.      . clopidogrel (PLAVIX) 75 MG tablet Take 75 mg by mouth daily.      Marland Kitchen HYDROcodone-acetaminophen (NORCO/VICODIN) 5-325 MG tablet Take 1 tablet by mouth every 6 (six) hours as needed for moderate pain. (Patient not taking: Reported on 09/14/2020) 12 tablet 0  . meclizine (ANTIVERT) 25 MG tablet Take 25 mg by mouth 3 (three) times daily as needed.          Home: Home Living Family/patient expects to be discharged to:: Private residence Living Arrangements: Alone Available Help at Discharge: Family, Neighbor Type of Home: Apartment Home Access: Stairs to enter, Media planner Home Layout: One level Bathroom Shower/Tub: Chiropodist: Standard Home Equipment: [employer] - single point, Grab bars - toilet, Grab bars - tub/shower, Hand held shower head Additional Comments: no animals  Functional History: Prior Function Level of Independence: Independent with assistive device(s) Comments: transportation via a neighbor Functional Status:  Mobility: Bed Mobility Overal bed mobility: Modified Independent Bed Mobility: Supine to Sit, Sit to Supine, Rolling Rolling: Modified independent (Device/Increase time) Supine to sit: Modified independent (Device/Increase time), HOB elevated Sit to supine: Modified independent (Device/Increase time), HOB elevated General bed mobility comments: Extra time, but able to come to transition supine <> sit EOB with HOB elevated safely. Rolling with utilization of bed rails. Transfers Overall  transfer level: Needs assistance Equipment used: Straight cane Transfers: Sit to/from Stand, Stand Pivot Transfers Sit to Stand: Min assist Stand pivot transfers: Min assist General transfer comment: STS 1x and stand step transfers 2x with slightly increased time to power up to stand, but no overt LOB. MinA for safety as unsteadiness was noted. Ambulation/Gait Ambulation/Gait assistance: Min assist Gait Distance (Feet): 50 Feet Assistive device: Straight cane Gait Pattern/deviations: Step-through pattern, Decreased stride length  General Gait Details: SPC in R hand, displaying appropriate use of cane. Displayed decreased stride length and moderate trunk sway which increased as distance progressed and pt became SOB but SpO2 remained >/= 96%. Pt noted feeling weak and requested to sit down. MinA to maintain balance, esp when cued to rotate head during gait. Gait velocity: decreased Gait velocity interpretation: <1.8 ft/sec, indicate of risk for recurrent falls   ADL: ADL Overall ADL's : Needs assistance/impaired Grooming: Wash/dry face, Wash/dry hands, Oral care, Standing, Min guard Upper Body Bathing: Min guard Lower Body Dressing: Supervision/safety, Bed level Lower Body Dressing Details (indicate cue type and reason): able to don socks long sitting Toilet Transfer: Min guard, RW Functional mobility during ADLs: Min guard, Rolling walker General ADL Comments: pt asking when she can progress past RW use but also reports unsteady at this time time without it   Cognition: Cognition Overall Cognitive Status: Within Functional Limits for tasks assessed Orientation Level: Oriented X4 Cognition Arousal/Alertness: Awake/alert Behavior During Therapy: WFL for tasks assessed/performed Overall Cognitive Status: Within Functional Limits for tasks assessed   Blood pressure 115/86, pulse 64, temperature 97.6 F (36.4 C), temperature source Oral, resp. rate 20, height 5\' 4"  (1.626 m), weight 57.8  kg, last menstrual period 08/10/2020, SpO2 100 %. Physical Exam Vitals reviewed.  Constitutional:      General: She is not in acute distress.    Appearance: She is not ill-appearing.  HENT:     Head: Normocephalic and atraumatic.     Right Ear: External ear normal.     Left Ear: External ear normal.     Nose: Nose normal.  Eyes:     General:        Right eye: No discharge.        Left eye: No discharge.     Extraocular Movements: Extraocular movements intact.  Cardiovascular:     Rate and Rhythm: Normal rate and regular rhythm.  Pulmonary:     Effort: Pulmonary effort is normal. No respiratory distress.     Breath sounds: No stridor.  Abdominal:     General: Abdomen is flat. Bowel sounds are normal. There is no distension.  Musculoskeletal:     Cervical back: Normal range of motion and neck supple.     Comments: Left lower extremity edema.  No tenderness.  Skin:    General: Skin is warm and dry.     Comments: Lesions to bilateral lower extremities.  Neurological:     Mental Status: She is alert and oriented to person, place, and time.     Comments: Alert Follows commands. Motor: Bilateral upper extremities: 4/5 proximal distal Bilateral lower extremities: 4/5 proximal distal  Psychiatric:        Mood and Affect: Mood normal.        Behavior: Behavior normal.        Thought Content: Thought content normal.        Lab Results Last 24 Hours  No results found for this or any previous visit (from the past 24 hour(s)).    Imaging Results (Last 48 hours)  ECHO TEE   Result Date: 09/20/2020    TRANSESOPHOGEAL ECHO REPORT   Patient Name:   NANCI LAKATOS Date of Exam: 09/20/2020 Medical Rec #:  [id number]          Height:       64.0 in Accession #:    [id number]         Weight:  127.4 lb Date of Birth:  1976/10/17           BSA:          1.615 m Patient Age:    55 years           BP:           180/95 mmHg Patient Gender: F                  HR:           107 bpm. Exam  Location:  Inpatient Procedure: Transesophageal Echo, 3D Echo, Color Doppler, Cardiac Doppler and            Saline Contrast Bubble Study Indications:     Stroke  History:         Patient has prior history of Echocardiogram examinations, most                  recent 09/18/2020. Risk Factors:Hypertension, Dyslipidemia and                  Lupus.  Sonographer:     Raquel Sarna Senior RDCS Referring Phys:  [place] Diagnosing Phys: Mertie Moores MD PROCEDURE: After discussion of the risks and benefits of a TEE, an informed consent was obtained from the patient. The transesophogeal probe was passed without difficulty through the esophogus of the patient. Local oropharyngeal anesthetic was provided with Cetacaine. Sedation performed by different physician. The patient was monitored while under deep sedation. Anesthestetic sedation was provided intravenously by Anesthesiology: 161mg  of Propofol, 40mg  of Lidocaine. The patient developed no complications during the procedure. IMPRESSIONS  1. Left ventricular ejection fraction, by estimation, is 55 to 60%. The left ventricle has normal function.  2. Right ventricular systolic function is normal. The right ventricular size is normal.  3. No left atrial/left atrial appendage thrombus was detected.  4. The mean MS gradient is 14 mmhg.     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 This could be the cause of her stroke.     3-D images of the mitral valve were obtained. . The mitral valve is abnormal. Moderate to severe mitral valve regurgitation. Moderate to severe mitral stenosis. Moderate to severe mitral annular calcification.  5. Tricuspid valve regurgitation is moderate to severe.  6. The aortic valve is grossly normal. Aortic valve regurgitation is mild to moderate. FINDINGS  Left Ventricle: Left ventricular ejection fraction, by estimation, is 55 to 60%. The left ventricle  has normal function. The left ventricular internal cavity size was normal in size. Right Ventricle: The right ventricular size is normal. No increase in right ventricular wall thickness. Right ventricular systolic function is normal. Left Atrium: Left atrial size was not well visualized. No left atrial/left atrial appendage thrombus was detected. Right Atrium: Right atrial size was not well visualized. Pericardium: There is no evidence of pericardial effusion. Mitral Valve: The mean MS gradient is 14 mmhg. 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 This could be the cause of her stroke. 3-D images of the mitral valve were obtained. The mitral valve is abnormal. There is severe thickening of the mitral valve leaflet(s). There is severe calcification of the mitral valve leaflet(s). Moderate to severe mitral annular calcification. Moderate  to severe mitral valve regurgitation. Moderate to severe mitral valve  stenosis. MV peak gradient, 22.1 mmHg. The mean mitral valve gradient is 14.0 mmHg. Pulmonary venous flow is normal. Tricuspid Valve: The tricuspid valve is grossly normal. Tricuspid valve regurgitation is moderate to severe. Aortic Valve: The aortic valve is grossly normal. Aortic valve regurgitation is mild to moderate. Pulmonic Valve: The pulmonic valve was grossly normal. Pulmonic valve regurgitation is not visualized. Aorta: The aortic root and ascending aorta are structurally normal, with no evidence of dilitation. IAS/Shunts: No atrial level shunt detected by color flow Doppler. Agitated saline contrast was given intravenously to evaluate for intracardiac shunting. With bubble contrast, there were bubbles that appeared in the LA aproximately 8-9 cardiac cycles after injection. These are most likely due to a transpulmonary AVM or shunt. This is not c/w an ASD or PFO.  MITRAL VALVE MV Peak grad: 22.1 mmHg MV Mean  grad: 14.0 mmHg MV Vmax:      2.35 m/s MV Vmean:     178.0 cm/s Mertie Moores MD Electronically signed by Mertie Moores MD Signature Date/Time: 09/20/2020/5:51:40 PM    Final        Assessment/Plan: Diagnosis: acute infarct involving the midline inferior vermis.   Labs independently reviewed.  Records reviewed and summated above.   1. Does the need for close, 24 hr/day medical supervision in concert with the patient's rehab needs make it unreasonable for this patient to be served in a less intensive setting? Yes  2. 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 Due to safety, disease management and patient education, does the patient require 24 hr/day rehab nursing? Yes 4. Does the patient require coordinated care of a physician, rehab nurse, therapy disciplines of PT/OT to address physical and functional deficits in the context of the above medical diagnosis(es)? Yes Addressing deficits in the following areas: balance, endurance, locomotion, strength, transferring, bathing, dressing, toileting and psychosocial support 5. Can the patient actively participate in an intensive therapy program of at least 3 hrs of therapy per day at least 5 days per week? Yes 6. The potential for patient to make measurable gains while on inpatient rehab is excellent 7. Anticipated functional outcomes upon discharge from inpatient rehab are modified independent and supervision  with PT, modified independent and supervision with OT, n/a with SLP. 8. Estimated  rehab length of stay to reach the above functional goals is: 7-11 days. 9. Anticipated discharge destination: Home 10. Overall Rehab/Functional Prognosis: good   RECOMMENDATIONS: This patient's condition is appropriate for continued rehabilitative care in the following setting: CIR Patient has agreed to participate in recommended program. Yes Note that insurance prior authorization may be required for reimbursement for recommended care.   Comment: Rehab Admissions Coordinator to follow up.   I have personally performed a face to face diagnostic evaluation, including, but not limited to relevant history and physical exam findings, of this patient and developed relevant assessment and plan.  Additionally, I have reviewed and concur with the physician assistant's documentation above.    Delice Lesch, MD, ABPMR Lavon Paganini Angiulli, PA-C 09/21/2020

## 2020-09-23 NOTE — Plan of Care (Signed)
Patient is currently resting in bed. Denies pain. VSS. AOx4. OOB w/ walker. Given medication for Potassium at 5.5. call bell within reach. No complaints at this time.   Problem: Education: Goal: Knowledge of disease or condition will improve Outcome: Progressing Goal: Knowledge of secondary prevention will improve Outcome: Progressing Goal: Knowledge of patient specific risk factors addressed and post discharge goals established will improve Outcome: Progressing   Problem: Coping: Goal: Will verbalize positive feelings about self Outcome: Progressing Goal: Will identify appropriate support needs Outcome: Progressing   Problem: Health Behavior/Discharge Planning: Goal: Ability to manage health-related needs will improve Outcome: Progressing   Problem: Self-Care: Goal: Ability to participate in self-care as condition permits will improve Outcome: Progressing Goal: Verbalization of feelings and concerns over difficulty with self-care will improve Outcome: Progressing Goal: Ability to communicate needs accurately will improve Outcome: Progressing   Problem: Nutrition: Goal: Risk of aspiration will decrease Outcome: Progressing Goal: Dietary intake will improve Outcome: Progressing   Problem: Intracerebral Hemorrhage Tissue Perfusion: Goal: Complications of Intracerebral Hemorrhage will be minimized Outcome: Progressing   Problem: Ischemic Stroke/TIA Tissue Perfusion: Goal: Complications of ischemic stroke/TIA will be minimized Outcome: Progressing   Problem: Spontaneous Subarachnoid Hemorrhage Tissue Perfusion: Goal: Complications of Spontaneous Subarachnoid Hemorrhage will be minimized Outcome: Progressing   Problem: Education: Goal: Knowledge of General Education information will improve Description: Including pain rating scale, medication(s)/side effects and non-pharmacologic comfort measures Outcome: Progressing   Problem: Health Behavior/Discharge Planning: Goal:  Ability to manage health-related needs will improve Outcome: Progressing   Problem: Clinical Measurements: Goal: Ability to maintain clinical measurements within normal limits will improve Outcome: Progressing Goal: Will remain free from infection Outcome: Progressing Goal: Diagnostic test results will improve Outcome: Progressing Goal: Respiratory complications will improve Outcome: Progressing Goal: Cardiovascular complication will be avoided Outcome: Progressing   Problem: Activity: Goal: Risk for activity intolerance will decrease Outcome: Progressing   Problem: Nutrition: Goal: Adequate nutrition will be maintained Outcome: Progressing   Problem: Coping: Goal: Level of anxiety will decrease Outcome: Progressing   Problem: Elimination: Goal: Will not experience complications related to bowel motility Outcome: Progressing Goal: Will not experience complications related to urinary retention Outcome: Progressing   Problem: Pain Managment: Goal: General experience of comfort will improve Outcome: Progressing   Problem: Safety: Goal: Ability to remain free from injury will improve Outcome: Progressing   Problem: Skin Integrity: Goal: Risk for impaired skin integrity will decrease Outcome: Progressing

## 2020-09-23 NOTE — Progress Notes (Signed)
Physical Therapy Treatment Patient Details Name: [name] MRN: [id number] DOB: Mar 13, 1976 Today's Date: 09/23/2020    History of Present Illness 44 yo admitted with dizziness with MRI demonstrating acute cerebellar infarct with chronic left parietal infarct. PMhx: ESRD, SLE, anemia, thrombocytopenia, HTN, HLD    PT Comments    Pt able to come to stand this date, but her BP did decline and thus pt returned to supine. She continues to display balance deficits, resulting in her requiring minA to maintain safety when coming to stand. Focused remaining of session on LE strengthening in bed as BP was within safe range supine in bed. Will continue to follow acutely and recommend CIR upon d/c to address her deficits to maximize her independence and safety with all functional mobility.    Follow Up Recommendations  CIR;Supervision for mobility/OOB     Equipment Recommendations  Other (comment) (TBD)    Recommendations for Other Services       Precautions / Restrictions Precautions Precautions: Fall Precaution Comments: orthostatic BP Restrictions Weight Bearing Restrictions: No    Mobility  Bed Mobility Overal bed mobility: Modified Independent Bed Mobility: Supine to Sit;Sit to Supine     Supine to sit: Modified independent (Device/Increase time);HOB elevated Sit to supine: Modified independent (Device/Increase time);HOB elevated   General bed mobility comments: Extra time, but able to come to transition supine <> sit EOB with HOB elevated safely  Transfers Overall transfer level: Needs assistance Equipment used: Rolling walker (2 wheeled) Transfers: Sit to/from Stand Sit to Stand: Min assist         General transfer comment: STS with minA for safety due to unsteadiness noted upon coming to stand.  Ambulation/Gait             General Gait Details: Unable to ambulate due to hypotension this date.   Stairs             Wheelchair Mobility     Modified Rankin (Stroke Patients Only) Modified Rankin (Stroke Patients Only) Pre-Morbid Rankin Score: Slight disability Modified Rankin: Moderately severe disability     Balance Overall balance assessment: Needs assistance Sitting-balance support: No upper extremity supported;Feet supported Sitting balance-Leahy Scale: Good Sitting balance - Comments: Able to maintain static sitting balance EOB without trunk sway or LOB.   Standing balance support: Bilateral upper extremity supported Standing balance-Leahy Scale: Fair Standing balance comment: Trunk sway noted with B UE support on RW during static standing, min guard for safety.                            Cognition Arousal/Alertness: Awake/alert Behavior During Therapy: WFL for tasks assessed/performed Overall Cognitive Status: Within Functional Limits for tasks assessed                                        Exercises General Exercises - Lower Extremity Gluteal Sets: AROM;Strengthening;Both;10 reps;Supine (bridging in bed) Long Arc Quad: Strengthening;Both;10 reps;Seated (resistance manually provided by therapist) Hip ABduction/ADduction: AROM;Strengthening;Both;10 reps;Sidelying (clamshells) Straight Leg Raises: AROM;Strengthening;Both;10 reps;Supine    General Comments General comments (skin integrity, edema, etc.): BP this date: 109/55 upon satrt of session long-sitting in bed, 99/54 once sitting EOB, 93/50 static standing with RW, returned to supine 104/65. No symptoms/signs of hypotension noted. HR 70s adn 96-99% SpO2.      Pertinent Vitals/Pain Pain Assessment: No/denies pain Pain Intervention(s): Limited  activity within patient's tolerance;Monitored during session    Home Living                      Prior Function            PT Goals (current goals can now be found in the care plan section) Acute Rehab PT Goals Patient Stated Goal: to get better and go home PT Goal  Formulation: With patient Time For Goal Achievement: 10/02/20 Potential to Achieve Goals: Good Progress towards PT goals: Progressing toward goals    Frequency    Min 4X/week      PT Plan Current plan remains appropriate    Co-evaluation              AM-PAC PT "6 Clicks" Mobility   Outcome Measure  Help needed turning from your back to your side while in a flat bed without using bedrails?: None Help needed moving from lying on your back to sitting on the side of a flat bed without using bedrails?: A Little Help needed moving to and from a bed to a chair (including a wheelchair)?: A Little Help needed standing up from a chair using your arms (e.g., wheelchair or bedside chair)?: A Little Help needed to walk in hospital room?: A Little Help needed climbing 3-5 steps with a railing? : A Lot 6 Click Score: 18    End of Session Equipment Utilized During Treatment: Gait belt Activity Tolerance: Treatment limited secondary to medical complications (Comment) (orthostatic BP) Patient left: in bed;with call bell/phone within reach;with bed alarm set   PT Visit Diagnosis: Other abnormalities of gait and mobility (R26.89);Difficulty in walking, not elsewhere classified (R26.2);Other symptoms and signs involving the nervous system (R29.898);Unsteadiness on feet (R26.81)     Time: 9480-1655 PT Time Calculation (min) (ACUTE ONLY): 17 min  Charges:  $Therapeutic Exercise: 8-22 mins                     Moishe Spice, PT, DPT Acute Rehabilitation Services  Pager: [phone] Office: [phone]    Orvan Falconer 09/23/2020, 3:19 PM

## 2020-09-23 NOTE — Progress Notes (Signed)
PMR Admission Coordinator Pre-Admission Assessment   Patient: [name] is an 44 y.o., female MRN: [id number] DOB: [birth year] Height: _0  (162.6 cm) Weight: 58.5 kg                                                                                                                                                  Insurance Information HMO: yes    PPO:      PCP:      IPA:      80/20:      OTHER:  PRIMARY: U[employer] Medicare      Policy#: [id number]      Subscriber: pt CM Name: [place]      Phone#: [phone]     Fax#: [phone] Pre-Cert#: [id number] auth for CIR provided by [place] at [place] with updates due to fax listed above on 11/10      Employer:  Benefits:  Phone #: [phone]     Name:  Eff. Date: 05/19/20     Deduct: $0      Out of Pocket Max: [phone] (met [phone])      Life Max: n/a  CIR: $1400 copay      SNF: 20 full days Outpatient: 80%     Co-Ins: 20% Home Health: 80%      Co-Ins: 20% DME: 80%     Co-Ins: 20% Providers:  SECONDARY:       Policy#:       Phone#:    Development worker, community:       Phone#:    The Engineer, petroleum" for patients in Inpatient Rehabilitation Facilities with attached "Privacy Act Prospect Records" was provided and verbally reviewed with: Patient   Emergency Contact Information         Contact Information     Name Relation Home Work Mobile    [place] Relative     [phone]    Noya, Santarelli [phone]        [facility] Relative [phone]        [place] [phone]           Current Medical History  Patient Admitting Diagnosis: R cerebellar CVA    History of Present Illness: [name] is a 44 year old right-handed female with history of lupus, end-stage renal disease with hemodialysis, hypertension, depression and anxiety as well as 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  Patient lives alone 1 level home.  Independent with assistive device.  Patient  does not drive and receive transportation via a neighbor.  She states she has a niece that assists as needed.  Presented 09/17/2020 with dizziness, nausea, vomiting as well as stool incontinence.  She initially presented to [facility] urgent care with dizziness and provided with antiemetics diagnosed with classic vertigo discharged home.  Patient with progressive symptoms and patient came to [facility] for further  evaluation.  MRI of the brain showed subcentimeter acute infarct involving the midline inferior vermis.  Chronic left parietal infarct.  CT angiogram of head and neck no hemodynamically significant stenosis in the neck.  No proximal intracranial vessel occlusion.  Admission chemistries potassium 5.3 glucose 170 BUN 56 creatinine 8.21 AST 78 ALT 105 alcohol negative.  WBC 12,100 hemoglobin 10.9 echocardiogram with severe left atrial dilation and mitral stenosis in the setting of significant annular calcifications.  TEE showed normal left ventricular function no AAS or thrombi.  Currently maintained on aspirin 325 mg for CVA prophylaxis and plan for loop recorder placement.  Hemodialysis ongoing as per renal services with plan schedule for outpatient fistulogram.  She is tolerating a regular consistency diet.  Therapy evaluations completed and patient was admitted for a comprehensive rehab program   Complete NIHSS TOTAL: 1 Glasgow Coma Scale Score: 15   Past Medical History      Past Medical History:  Diagnosis Date  . Allergy    . Anemia    . Anxiety    . Arthritis    . Avascular necrosis of bone (HCC)      left tibial talus due to chronic prednisone use  . Cerebellar stroke ([place])    . Depression    . Dyspnea      too much  fluid  . ESRD (end stage renal disease) on dialysis ([place])      [place]  . GERD (gastroesophageal reflux disease)    . Headache(784.0)    . History of high blood pressure    . Hyperlipemia    . Hypertension    . Lupus ([place])    . Pneumonia    .  Secondary hyperparathyroidism (of renal origin)        Family History  family history includes Asthma in her sister; COPD in her maternal uncle; Heart attack (age of onset: 56) in her mother; Hypertension in her mother.   Prior Rehab/Hospitalizations:  Has the patient had prior rehab or hospitalizations prior to admission? No   Has the patient had major surgery during 100 days prior to admission? Yes   Current Medications    Current Facility-Administered Medications:  .  acetaminophen (TYLENOL) tablet 650 mg, 650 mg, Oral, Q6H PRN **OR** acetaminophen (TYLENOL) suppository 650 mg, 650 mg, Rectal, Q6H PRN, Nahser, Wonda Cheng, MD .  aspirin EC tablet 325 mg, 325 mg, Oral, Daily, Rosalin Hawking, MD, 325 mg at 09/22/20 1119 .  atorvastatin (LIPITOR) tablet 40 mg, 40 mg, Oral, Daily, Nahser, Wonda Cheng, MD, 40 mg at 09/22/20 1120 .  calcium acetate (Phos Binder) (PHOSLYRA) 667 MG/5ML oral solution 1,334 mg, 1,334 mg, Oral, TID WC, Nahser, Wonda Cheng, MD, 1,334 mg at 09/22/20 1424 .  Chlorhexidine Gluconate Cloth 2 % PADS 6 each, 6 each, Topical, Daily, Nahser, Wonda Cheng, MD, 6 each at 09/21/20 [id number] .  metoprolol tartrate (LOPRESSOR) tablet 25 mg, 25 mg, Oral, BID, Wilber Oliphant, MD .  pantoprazole (PROTONIX) EC tablet 40 mg, 40 mg, Oral, Daily, Nahser, Wonda Cheng, MD, 40 mg at 09/22/20 1124 .  PARoxetine (PAXIL) tablet 40 mg, 40 mg, Oral, Daily, Nahser, Wonda Cheng, MD, 40 mg at 09/22/20 1120 .  polyvinyl alcohol (LIQUIFILM TEARS) 1.4 % ophthalmic solution 2 drop, 2 drop, Both Eyes, Daily PRN, Nahser, Wonda Cheng, MD .  predniSONE (DELTASONE) tablet 10 mg, 10 mg, Oral, Q breakfast, Nahser, Wonda Cheng, MD, 10 mg at 09/22/20 0902 .  traZODone (DESYREL) tablet 100 mg,  100 mg, Oral, QHS, Nahser, Wonda Cheng, MD, 100 mg at 09/22/20 2116   Patients Current Diet:     Diet Order                      Diet renal with fluid restriction Fluid restriction: 1200 mL Fluid; Room service appropriate? Yes; Fluid consistency: Thin   Diet effective now                      Precautions / Restrictions Precautions Precautions: Fall Precaution Comments: orthostatic BP Restrictions Weight Bearing Restrictions: No    Has the patient had 2 or more falls or a fall with injury in the past year?No   Prior Activity Level Limited Community (1-2x/wk): independent prior to admit, living alone using [facility] for mobility, neighbors and family drive to appointments   Prior Functional Level Prior Function Level of Independence: Independent with assistive device(s) Comments: transportation via a neighbor   Self Care: Did the patient need help bathing, dressing, using the toilet or eating?  Independent   Indoor Mobility: Did the patient need assistance with walking from room to room (with or without device)? Independent   Stairs: Did the patient need assistance with internal or external stairs (with or without device)? Independent   Functional Cognition: Did the patient need help planning regular tasks such as shopping or remembering to take medications? Independent   Home Assistive Devices / Equipment Home Equipment: Cane - single point, Grab bars - toilet, Grab bars - tub/shower, Hand held shower head   Prior Device Use: Indicate devices/aids used by the patient prior to current illness, exacerbation or injury? cane   Current Functional Level Cognition   Overall Cognitive Status: Within Functional Limits for tasks assessed Orientation Level: Oriented X4    Extremity Assessment (includes Sensation/Coordination)   Upper Extremity Assessment: Generalized weakness RUE Deficits / Details: noted to have tremor pt reports is increased  Lower Extremity Assessment: Defer to PT evaluation RLE Deficits / Details: strength WFL with decreased RAM of toe tapping unable to maintain coordination together, good performance with heel to shin LLE Deficits / Details: strength WFL with decreased RAM of toe tapping unable to maintain  coordination together, good performance with heel to shin     ADLs   Overall ADL's : Needs assistance/impaired Grooming: Wash/dry face, Wash/dry hands, Oral care, Min guard, Sitting Grooming Details (indicate cue type and reason): limited by BP - so in sitting for safety Upper Body Bathing: Min guard Lower Body Dressing: Supervision/safety, Bed level Lower Body Dressing Details (indicate cue type and reason): able to don socks long sitting Toilet Transfer: Min guard, Buyer, retail Details (indicate cue type and reason): HHA today - Pt has been using cane with PT Functional mobility during ADLs: Min guard, Rolling walker General ADL Comments: BP limiting this session     Mobility   Overal bed mobility: Modified Independent Bed Mobility: Supine to Sit, Sit to Supine Rolling: Modified independent (Device/Increase time) Supine to sit: Modified independent (Device/Increase time), HOB elevated Sit to supine: Modified independent (Device/Increase time), HOB elevated General bed mobility comments: Extra time, but able to come to transition supine <> sit EOB with HOB elevated safely     Transfers   Overall transfer level: Needs assistance Equipment used: 1 person hand held assist Transfers: Stand Pivot Transfers Sit to Stand: Min assist Stand pivot transfers: Min assist General transfer comment: Unable to transfer due to orthostatic BP this date  Ambulation / Gait / Stairs / Wheelchair Mobility   Ambulation/Gait Ambulation/Gait assistance: Herbalist (Feet): 50 Feet Assistive device: Straight cane Gait Pattern/deviations: Step-through pattern, Decreased stride length General Gait Details: Unable to ambulate due to orthostatic BP this date Gait velocity: decreased Gait velocity interpretation: <1.8 ft/sec, indicate of risk for recurrent falls     Posture / Balance Dynamic Sitting Balance Sitting balance - Comments: Able to maintain static sitting balance  EOB without trunk sway or LOB. Static Standing Balance Tandem Stance - Right Leg: 5 Tandem Stance - Left Leg: 10 Rhomberg - Eyes Opened: 30 Balance Overall balance assessment: Needs assistance Sitting-balance support: No upper extremity supported, Feet supported Sitting balance-Leahy Scale: Good Sitting balance - Comments: Able to maintain static sitting balance EOB without trunk sway or LOB. Standing balance support: Single extremity supported Standing balance-Leahy Scale: Fair Standing balance comment: Moderate trunk sway noted this date, requiring 1 UE support and minA at times to maintain balance. Tandem Stance - Right Leg: 5 Tandem Stance - Left Leg: 10 Rhomberg - Eyes Opened: 30     Special needs/care consideration Dialysis: Hemodialysis Tuesday, Thursday and Saturday        Previous Home Environment (from acute therapy documentation) Living Arrangements: Alone Available Help at Discharge: Family, Neighbor Type of Home: Apartment Home Layout: One level Home Access: Stairs to enter, Building control surveyor Shower/Tub: Chiropodist: Standard Additional Comments: no animals   Discharge Living Setting Plans for Discharge Living Setting: Patient's home Type of Home at Discharge: Apartment Discharge Home Layout: One level Discharge Home Access: Elevator, Stairs to enter Discharge Bathroom Shower/Tub: Tub/shower unit Discharge Bathroom Toilet: Standard Discharge Bathroom Accessibility: Yes How Accessible: Accessible via walker Does the patient have any problems obtaining your medications?: No   Social/Family/Support Systems Anticipated Caregiver: neice, Boni Maclellan Anticipated Caregiver's Contact Information: [phone] Ability/Limitations of Caregiver: supervision Caregiver Availability: 24/7 Discharge Plan Discussed with Primary Caregiver: Yes (pt discussed prior to consult) Is Caregiver In Agreement with Plan?: Yes Does Caregiver/Family have Issues  with Lodging/Transportation while Pt is in Rehab?: No     Goals Patient/Family Goal for Rehab: PT/OT intermittent supervision, no SLP Expected length of stay: 7-10 days Additional Information: HD TRS Pt/Family Agrees to Admission and willing to participate: Yes Program Orientation Provided & Reviewed with Pt/Caregiver Including Roles  & Responsibilities: Yes  Barriers to Discharge: Insurance for SNF coverage     Decrease burden of Care through IP rehab admission: n/a   Possible need for SNF placement upon discharge: Not anticipated   Patient Condition: This patient's medical and functional status has changed since the consult dated: 11/3 in which the Rehabilitation Physician determined and documented that the patient's condition is appropriate for intensive rehabilitative care in an inpatient rehabilitation facility. See "History of Present Illness" (above) for medical update. Functional changes are: min assist. Patient's medical and functional status update has been discussed with the Rehabilitation physician and patient remains appropriate for inpatient rehabilitation. Will admit to inpatient rehab today.   Preadmission Screen Completed By:  Michel Santee, PT, DPT 09/23/2020 12:42 PM ______________________________________________________________________   Discussed status with Dr. Ranell Patrick on 09/23/20 at  12:42 PM  and received approval for admission today.   Admission Coordinator:  Michel Santee, PT, DPT time 12:42 PM Sudie Grumbling 09/23/20              Cosigned by: Izora Ribas, MD at 09/23/2020 12:45 PM

## 2020-09-23 NOTE — Progress Notes (Addendum)
INTERIM PROGRESS NOTE  Nurse Wilburn Cornelia recheck patient's blood pressure at 0300, was 125/84 w/ MAP 98, is now 101/78 with MAP of 86.  Plan to recheck in 4 hours.  Additionally, RN Wilburn Cornelia text-paged team regarding patient's potassium. K still elevated at 5.5, however improved from previous 5.7. Patient received 1 dose of Lokelma 10 yesterday for elevated K. Will repeat another 1-time dose of Lokelma 10mg . Plan discussed with [place]ia who is in agreement.    Milus Banister, [place], PGY-3 09/23/2020 3:27 AM

## 2020-09-23 NOTE — Progress Notes (Signed)
Report given to Dialysis RN. Transport will be here for patient in 20 mins.

## 2020-09-23 NOTE — Plan of Care (Signed)
°  Problem: Education: Goal: Knowledge of disease or condition will improve Outcome: Progressing   Problem: Coping: Goal: Will identify appropriate support needs Outcome: Progressing   Problem: Self-Care: Goal: Ability to participate in self-care as condition permits will improve Outcome: Progressing   Problem: Self-Care: Goal: Ability to communicate needs accurately will improve Outcome: Progressing   Problem: Nutrition: Goal: Dietary intake will improve Outcome: Progressing   Problem: Activity: Goal: Risk for activity intolerance will decrease Outcome: Progressing

## 2020-09-23 NOTE — Progress Notes (Addendum)
[facility] Progress Note   Subjective:  Patient feels well today with no complaints.  Some low blood pressures but asymptomatic.  Awaiting rehab admission  Objective Vitals:   09/23/20 0945 09/23/20 1015 09/23/20 1030 09/23/20 1100  BP: 116/77 104/69 114/82 107/86  Pulse: 64 65 63 71  Resp: 12 14 10 16   Temp:   97.9 F (36.6 C) 97.8 F (36.6 C)  TempSrc:   Oral Oral  SpO2: 99% 100% 99% 100%  Weight:   58.5 kg   Height:         Additional Objective Labs: Basic Metabolic Panel: Recent Labs  Lab 09/21/20 1810 09/22/20 0957 09/23/20 0154  NA 137 136 136  K 5.2* 5.7* 5.5*  CL 101 98 100  CO2 20* 21* 20*  GLUCOSE 188* 103* 155*  BUN 50* 65* 77*  CREATININE 6.64* 7.64* 8.83*  CALCIUM 8.1* 8.2* 8.0*  PHOS 6.6* 7.9* 7.9*   CBC: Recent Labs  Lab 09/17/20 1303 09/17/20 1311 09/19/20 0242 09/19/20 0242 09/20/20 0511 09/20/20 0511 09/21/20 1810 09/22/20 1230 09/23/20 0741  WBC 12.1*   < > 10.9*   < > 10.3   < > 11.9* 12.4* 11.9*  NEUTROABS 9.9*  --   --   --   --   --   --  8.8*  --   HGB 10.9*   < > 10.4*   < > 10.3*   < > 11.8* 12.3 12.7  HCT 35.1*   < > 33.6*   < > 32.7*   < > 38.0 39.2 34.4*  MCV 91.9   < > 91.6  --  90.1  --  91.1 90.7 91.2  PLT 78*   < > 74*   < > 75*   < > 106* 99* 95*   < > = values in this interval not displayed.   Blood Culture    Component Value Date/Time   SDES BLOOD BLOOD LEFT HAND 09/22/2020 1414   SPECREQUEST AEROBIC BOTTLE ONLY Blood Culture adequate volume 09/22/2020 1414   CULT  09/22/2020 1414    NO GROWTH < 24 HOURS Performed at [facility], [place] [address], [place], [place] [address]    REPTSTATUS PENDING 09/22/2020 1414     Physical Exam General: lying in bed no distress Heart: normal rate Lungs: bilateral chest rise, no iwob Abdomen: Soft non-tender  Extremities: No lower extremity edema Dialysis Access: R IJ TDC dsg c,d,i; R AVF +bruit   Medications:  . aspirin EC  325 mg Oral Daily   . atorvastatin  40 mg Oral Daily  . calcium acetate (Phos Binder)  1,334 mg Oral TID WC  . Chlorhexidine Gluconate Cloth  6 each Topical Daily  . metoprolol tartrate  25 mg Oral BID  . pantoprazole  40 mg Oral Daily  . PARoxetine  40 mg Oral Daily  . predniSONE  10 mg Oral Q breakfast  . traZODone  100 mg Oral QHS    Dialysis Orders:  [facility] TTS 3h 400/600 2K/2.5Ca EDW 58kg TDC Hep bolus 1800 EPO 6600 q HD Venofer 50 q Mon  Assessment/Plan: 1. CVA. MRI showing subcentimeter acute infarct involving the midline inferior vermis. Chronic left parietal infarct -per neuro/primary team. On antiplatelet therapy.  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  Cardiology reviewed further and thinks this is simply calcification.  Undergoing implanted loop recorder to rule out atrial fibrillation. Cardiolipin IgM is elevated. Defer to other providers regarding consideration of hypercoagulable state requiring  anticoagulation 2. ESRD -  HD TTS via TDC. Held dialysis yesterday due to hypotension. Short session of dialysis tomorrow to resume TTS schedule. OP fistulogram rescheduled 3. Hypertension/volume  -lower blood pressure recently.  Stop diltiazem.  Continue metoprolol but would hold if systolic blood pressures less than 110. 4. Anemia  - Hgb acceptable. No ESA needs currently  5. Metabolic bone disease -  Ca ok. Continue home binders (phoslyra). No VDRA.  6.  SLE - anti-dsdna norma, Ro and La high.  Anticardiolipin IgM high 7. Mitral stenosis: Conservative management at this time.  Cardiology following

## 2020-09-23 NOTE — TOC Transition Note (Signed)
Transition of Care [facility]) - CM/SW Discharge Note   Patient Details  Name: [name] MRN: [id number] Date of Birth: 1975-11-27  [facility] CM/SW Contact:  Pollie Friar, RN Phone Number: 09/23/2020, 11:37 AM   Clinical Narrative:    Pt is discharging to CIR today. CM signing off.   Final next level of care: IP Rehab Facility Barriers to Discharge: No Barriers Identified   Patient Goals and CMS Choice        Discharge Placement                       Discharge Plan and Services                                     Social Determinants of Health (SDOH) Interventions     Readmission Risk Interventions No flowsheet data found.

## 2020-09-23 NOTE — Progress Notes (Signed)
Inpatient Rehab Admissions Coordinator:   I have a bed available for pt to admit to CIR today.  Awaiting confirmation from MD that pt is ready to admit.  Will let pt/family and TOC team know.   Shann Medal, PT, DPT Admissions Coordinator [phone] 09/23/20  10:36 AM

## 2020-09-23 NOTE — Discharge Instructions (Signed)
Thank you for letting us care for you during your stay.  You were admitted to the [facility] Medicine Teaching Service.   You were admitted for dizziness.  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    We recommend follow up with cardiology to assess the results of the cardiac loop recorder, with Dr. Leonie Man at the stroke clinic in about 4 weeks, and with nephrology to reschedule your fistulogram.   Please follow up with your primary care physician in 1 week.   If your symptoms worsen or return, please return to the hospital.  Please let us know if you have questions about your stay at [facility] After Your Loop Recorder  . You have a [employer] Loop Recorder   . Monitor your cardiac device site for redness, swelling, and drainage. Call the device clinic at [phone] if you experience these symptoms or fever/chills.  . You may shower 3 days after your loop recorder implant and wash your incision with soap and water. Avoid lotions, ointments, or perfumes over your incision until it is well-healed.  . You may use a hot tub or a pool AFTER your wound check appointment if the incision is completely closed.  . Your device is MRI compatible.   . Remote monitoring is used to monitor your cardiac device from home. This monitoring is scheduled every month days by our office. It allows [place] to keep an eye on the functioning of your device to ensure it is working properly.

## 2020-09-23 NOTE — Plan of Care (Signed)
  Problem: Consults Goal: RH STROKE PATIENT EDUCATION Description: See Patient Education module for education specifics . Patient will be educated about stroke with min assist Outcome: Progressing   Problem: RH SKIN INTEGRITY Goal: RH STG SKIN FREE OF INFECTION/BREAKDOWN Description: Pt will be free of skin infection or breakdown with min assist Outcome: Progressing Goal: RH STG MAINTAIN SKIN INTEGRITY WITH ASSISTANCE Description: STG Maintain Skin Integrity With min Assistance. Outcome: Progressing Goal: RH STG ABLE TO PERFORM INCISION/WOUND CARE W/ASSISTANCE Description: STG Able To Perform Incision/Wound Care With min Assistance. Outcome: Progressing   Problem: RH SAFETY Goal: RH STG ADHERE TO SAFETY PRECAUTIONS W/ASSISTANCE/DEVICE Description: STG Adhere to Safety Precautions With min Assistance/Device. Outcome: Progressing Goal: RH STG DECREASED RISK OF FALL WITH ASSISTANCE Description: STG Decreased Risk of Fall With min Assistance. Outcome: Progressing   Problem: RH COGNITION-NURSING Goal: RH STG ANTICIPATES NEEDS/CALLS FOR ASSIST W/ASSIST/CUES Description: STG Anticipates Needs/Calls for Assist With min Assistance/Cues. Outcome: Progressing   Problem: RH PAIN MANAGEMENT Goal: RH STG PAIN MANAGED AT OR BELOW PT'S PAIN GOAL Description: Pain < 3/10. Outcome: Progressing   Problem: RH KNOWLEDGE DEFICIT Goal: RH STG INCREASE KNOWLEDGE OF HYPERTENSION Description: Pt will have increased knowledge about hypertension with min assist Outcome: Progressing Goal: RH STG INCREASE KNOWLEGDE OF HYPERLIPIDEMIA Description: Pt will have increase knowledge about hyperlipidemia with min assist Outcome: Progressing Goal: RH STG INCREASE KNOWLEDGE OF STROKE PROPHYLAXIS Description: Patient will be knowledgeable on stroke prophylaxis with min assist Outcome: Progressing

## 2020-09-24 ENCOUNTER — Inpatient Hospital Stay (HOSPITAL_COMMUNITY): Payer: Medicare Other

## 2020-09-24 ENCOUNTER — Inpatient Hospital Stay (HOSPITAL_COMMUNITY): Payer: Medicare Other | Admitting: Occupational Therapy

## 2020-09-24 DIAGNOSIS — Z8673 Personal history of transient ischemic attack (TIA), and cerebral infarction without residual deficits: Secondary | ICD-10-CM

## 2020-09-24 LAB — RENAL FUNCTION PANEL
Albumin: 2.6 g/dL — ABNORMAL LOW (ref 3.5–5.0)
Anion gap: 12 (ref 5–15)
BUN: 42 mg/dL — ABNORMAL HIGH (ref 6–20)
CO2: 26 mmol/L (ref 22–32)
Calcium: 8 mg/dL — ABNORMAL LOW (ref 8.9–10.3)
Chloride: 101 mmol/L (ref 98–111)
Creatinine, Ser: 5.93 mg/dL — ABNORMAL HIGH (ref 0.44–1.00)
GFR, Estimated: 8 mL/min — ABNORMAL LOW (ref >60)
Glucose, Bld: 168 mg/dL — ABNORMAL HIGH (ref 70–99)
Phosphorus: 4.4 mg/dL (ref 2.5–4.6)
Potassium: 3.5 mmol/L (ref 3.5–5.1)
Sodium: 139 mmol/L (ref 135–145)

## 2020-09-24 LAB — CBC
HCT: 31.3 % — ABNORMAL LOW (ref 36.0–46.0)
Hemoglobin: 9.8 g/dL — ABNORMAL LOW (ref 12.0–15.0)
MCH: 29.2 pg (ref 26.0–34.0)
MCHC: 31.3 g/dL (ref 30.0–36.0)
MCV: 93.2 fL (ref 80.0–100.0)
Platelets: 99 10*3/uL — ABNORMAL LOW (ref 150–400)
RBC: 3.36 MIL/uL — ABNORMAL LOW (ref 3.87–5.11)
RDW: 18.5 % — ABNORMAL HIGH (ref 11.5–15.5)
WBC: 9 10*3/uL (ref 4.0–10.5)
nRBC: 0 % (ref 0.0–0.2)

## 2020-09-24 MED ORDER — CLOPIDOGREL BISULFATE 75 MG PO TABS
75.0000 mg | ORAL_TABLET | Freq: Every day | ORAL | Status: DC
  Administered 2020-09-24 – 2020-09-26 (×3): 75 mg via ORAL
  Filled 2020-09-24 (×4): qty 1

## 2020-09-24 MED ORDER — CHLORHEXIDINE GLUCONATE CLOTH 2 % EX PADS
6.0000 | MEDICATED_PAD | Freq: Two times a day (BID) | CUTANEOUS | Status: DC
  Administered 2020-09-24 – 2020-09-30 (×11): 6 via TOPICAL

## 2020-09-24 MED ORDER — CALCIUM ACETATE (PHOS BINDER) 667 MG/5ML PO SOLN
2001.0000 mg | Freq: Three times a day (TID) | ORAL | Status: DC
  Administered 2020-09-24 – 2020-09-30 (×16): 2001 mg via ORAL
  Filled 2020-09-24 (×23): qty 15

## 2020-09-24 MED ORDER — RENA-VITE PO TABS
1.0000 | ORAL_TABLET | Freq: Every day | ORAL | Status: DC
  Administered 2020-09-24 – 2020-09-29 (×6): 1 via ORAL
  Filled 2020-09-24 (×7): qty 1

## 2020-09-24 MED ORDER — HEPARIN SODIUM (PORCINE) 1000 UNIT/ML IJ SOLN
INTRAMUSCULAR | Status: AC
  Administered 2020-09-24: 1000 [IU]
  Filled 2020-09-24: qty 6

## 2020-09-24 NOTE — Progress Notes (Addendum)
Inpatient Rehabilitation Medication Review by a Pharmacist  A complete drug regimen review was completed for this patient to identify any potential clinically significant medication issues.  Clinically significant medication issues were identified:  yes   Type of Medication Issue Identified Description of Issue Urgent (address now) Non-Urgent (address on AM team rounds) Plan Plan Accepted by Provider? (Yes / No / Pending AM Rounds)  Drug Interaction(s) (clinically significant)       Duplicate Therapy       Allergy       No Medication Administration End Date       Incorrect Dose       Additional Drug Therapy Needed  Meds recommended to start/continue on transfer to CIR but not ordered: - Plavix 75 mg daily - B complex/vitamin C/folic acid Urgent Messaged MD Yes - ordered per MD  Other  Phoslyra dose continued from [facility] inpatient to CIR. Discharge instructions to resume home dose  2001 mg with each meal. Non-urgent Messaged MD Yes - dose adjusted per MD    Name of provider notified for urgent issues identified: Raulkar  Provider Method of Notification: secure chat   For non-urgent medication issues to be resolved on team rounds tomorrow morning a [facility] Secure Chat Handoff was sent to:    Pharmacist comments:   Time spent performing this drug regimen review (minutes):  [place], PharmD PGY-1 Pharmacy Resident 09/24/2020 4:09 PM Please see AMION for all pharmacy numbers

## 2020-09-24 NOTE — H&P (Signed)
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  Patient lives alone 1 level home.  Independent with assistive device.  Patient does not drive and receive transportation via a neighbor.  She states she has a niece that assists as needed.  Presented 09/17/2020 with dizziness, nausea, vomiting as well as stool incontinence.  She initially presented to [facility] urgent care with dizziness and provided with antiemetics diagnosed with classic vertigo discharged home.  Patient with progressive symptoms and patient came to [facility] for further evaluation.  MRI of the brain showed subcentimeter acute infarct involving the midline inferior vermis.  Chronic left parietal infarct.  CT angiogram of head and neck no hemodynamically significant stenosis in the neck.  No proximal intracranial vessel occlusion.  Admission chemistries potassium 5.3 glucose 170 BUN 56 creatinine 8.21 AST 78 ALT 105 alcohol negative.  WBC 12,100 hemoglobin 10.9 echocardiogram with severe left atrial dilation and mitral stenosis in the setting of significant annular calcifications.  TEE showed normal left ventricular function no AAS or thrombi.  Currently maintained on aspirin 325 mg for CVA prophylaxis and plan for loop recorder placement.  Hemodialysis ongoing as per renal services with plan schedule for outpatient fistulogram.  She is tolerating a regular consistency diet.  Therapy evaluations completed and patient was admitted for a comprehensive rehab program.  Review of Systems  Constitutional: Positive for malaise/fatigue. Negative for chills and fever.  HENT: Negative for hearing loss.   Eyes: Negative for blurred vision and double vision.  Respiratory: Negative for cough and  shortness of breath.   Cardiovascular: Positive for leg swelling. Negative for chest pain and palpitations.  Gastrointestinal: Positive for nausea and vomiting.       GERD  Genitourinary: Negative for dysuria, flank pain and hematuria.  Musculoskeletal: Positive for joint pain and myalgias.  Skin: Negative for rash.  Neurological: Positive for dizziness, weakness and headaches.  Psychiatric/Behavioral: Positive for depression. The patient has insomnia.        Anxiety  All other systems reviewed and are negative.  Past Medical History:  Diagnosis Date  . Allergy   . Anemia   . Anxiety   . Arthritis   . Avascular necrosis of bone (HCC)    left tibial talus due to chronic prednisone use  . Cerebellar stroke ([place])   . Depression   . Dyspnea    too much  fluid  . ESRD (end stage renal disease) on dialysis ([place])    tthsat [place]  . GERD (gastroesophageal reflux disease)   . Headache(784.0)   . History of high blood pressure   . Hyperlipemia   . Hypertension   . Lupus ([place])   . Pneumonia   . Secondary hyperparathyroidism (of renal origin)    Past Surgical History:  Procedure Laterality Date  . A/V FISTULAGRAM Left 12/02/2017   Procedure: A/V FISTULAGRAM - Left upper;  Surgeon: Angelia Mould, MD;  Location: [place] CV LAB;  Service: Cardiovascular;  Laterality: Left;  . AV FISTULA PLACEMENT Left   . AV FISTULA PLACEMENT Right 01/26/2014   Procedure: ARTERIOVENOUS (AV) FISTULA CREATION; ultrasound guided;  Surgeon: Conrad Benson, MD;  Location: [place];  Service: Vascular;  Laterality: Right;  . AV FISTULA PLACEMENT Right 04/25/2020   Procedure: RIGHT UPPER ARM  BASILIC VEIN ARTERIOVENOUS (AV) FISTULA;  Surgeon: [name], MD;  Location: [place];  Service: Vascular;  Laterality: Right;  . BASCILIC VEIN TRANSPOSITION Right 07/01/2020   Procedure: SECOND STAGE BASCILIC VEIN TRANSPOSITION RIGHT UPPER EXTREMITY;  Surgeon: [name], MD;  Location: [place];   Service: Vascular;  Laterality: Right;  . BUBBLE STUDY  09/20/2020   Procedure: BUBBLE STUDY;  Surgeon: Thayer Headings, MD;  Location: [facility] ENDOSCOPY;  Service: Cardiovascular;;  . ESOPHAGOGASTRODUODENOSCOPY N/A 03/17/2016   Procedure: ESOPHAGOGASTRODUODENOSCOPY (EGD);  Surgeon: Irene Shipper, MD;  Location: [facility] ENDOSCOPY;  Service: Endoscopy;  Laterality: N/A;  . FISTULOGRAM Left 09/24/2019   Procedure: FISTULOGRAM LEFT ARM;  Surgeon: [name], MD;  Location: [place];  Service: Vascular;  Laterality: Left;  . HEMATOMA EVACUATION Left 10/09/2013   Procedure: EVACUATION HEMATOMA;  Surgeon: Angelia Mould, MD;  Location: [place];  Service: Vascular;  Laterality: Left;  . HEMATOMA EVACUATION Left 09/24/2019   Procedure: EVACUATION HEMATOMA  LEFT ARM;  Surgeon: [name], MD;  Location: [place];  Service: Vascular;  Laterality: Left;  . INCISION AND DRAINAGE ABSCESS     gluteal  . INSERTION OF DIALYSIS CATHETER N/A 10/09/2013   Procedure: INSERTION OF DIALYSIS CATHETER; ULTRASOUND GUIDED;  Surgeon: Angelia Mould, MD;  Location: [place];  Service: Vascular;  Laterality: N/A;  . INSERTION OF DIALYSIS CATHETER Left 09/24/2019   Procedure: INSERTION OF TUNNELLED DIALYSIS CATHETER - PALINDROME 15FR X 28CM;  Surgeon: [name], MD;  Location: [place];  Service: Vascular;  Laterality: Left;  . INSERTION OF DIALYSIS CATHETER Right 03/15/2020   Procedure: Insertion Of Dialysis Catheter;  Surgeon: Angelia Mould, MD;  Location: [place];  Service: Cardiovascular;  Laterality: Right;  . LOOP RECORDER INSERTION N/A 09/22/2020   Procedure: LOOP RECORDER INSERTION;  Surgeon: Thompson Grayer, MD;  Location: [place] CV LAB;  Service: Cardiovascular;  Laterality: N/A;  . PERIPHERAL VASCULAR BALLOON ANGIOPLASTY Left 12/02/2017   Procedure: PERIPHERAL VASCULAR BALLOON ANGIOPLASTY;  Surgeon: Angelia Mould, MD;  Location: [place] CV LAB;  Service: Cardiovascular;   Laterality: Left;  . REVISION OF ARTERIOVENOUS GORETEX GRAFT Left 04/03/2019   Procedure: REVISION OF ARTERIOVENOUS GORETEX GRAFT PSEUDOANEURYSM;  Surgeon: Angelia Mould, MD;  Location: [place];  Service: Vascular;  Laterality: Left;  . SHUNTOGRAM N/A 01/07/2014   Procedure: fistulogram with possibe venoplasty left upper arm avg;  Surgeon: Angelia Mould, MD;  Location: [facility] CATH LAB;  Service: Cardiovascular;  Laterality: N/A;  . TEE WITHOUT CARDIOVERSION N/A 09/20/2020   Procedure: TRANSESOPHAGEAL ECHOCARDIOGRAM (TEE);  Surgeon: Acie Fredrickson, Wonda Cheng, MD;  Location: [place];  Service: Cardiovascular;  Laterality: N/A;  . THROMBECTOMY AND REVISION OF ARTERIOVENTOUS (AV) GORETEX  GRAFT Left 03/15/2020   Procedure: ATTEMPTED THROMBECTOMY OF LEFT ARM ARTERIOVENTOUS (AV) GORETEX  GRAFT;  Surgeon: Angelia Mould, MD;  Location: [place];  Service: Cardiovascular;  Laterality: Left;  . tibiocalcaneal fusion  left Left   . ULTRASOUND GUIDANCE FOR VASCULAR ACCESS  09/24/2019   Procedure: Ultrasound Guidance For Vascular Access;  Surgeon: [name], MD;  [facility];  Service: Vascular;;   Family History  Problem Relation Age of Onset  . Asthma Sister   . Hypertension Mother   . Heart attack Mother 61       died at 28  . COPD Maternal Uncle        prostate  . Mental illness Neg Hx    Social  History:  reports that she has never smoked. She has never used smokeless tobacco. She reports that she does not drink alcohol and does not use drugs. Allergies:  Allergies  Allergen Reactions  . Amlodipine Hives and Swelling  . Sulfa Antibiotics Hives and Itching  . Vancomycin Hives and Itching  . Venlafaxine Other (See Comments)    Emotional   Medications Prior to Admission  Medication Sig Dispense Refill  . acetaminophen (TYLENOL) 500 MG tablet Take 1,000 mg by mouth every 6 (six) hours as needed for moderate pain or headache.     Marland Kitchen aspirin EC 325 MG EC tablet Take 1  tablet (325 mg total) by mouth daily. 30 tablet 0  . atorvastatin (LIPITOR) 40 MG tablet Take 1 tablet (40 mg total) by mouth daily.    Marland Kitchen b complex-vitamin c-folic acid (NEPHRO-VITE) 0.8 MG TABS tablet Take 1 tablet by mouth daily at 12 noon.    . calcium acetate, Phos Binder, (PHOSLYRA) 667 MG/5ML SOLN Take 2,001 mg by mouth See admin instructions. Take 15 mls (2001 mg) by mouth with each meal and snack - up to 9 times daily    . Carboxymethylcellul-Glycerin (LUBRICATING EYE DROPS OP) Place 1 drop into both eyes daily as needed (dry eyes).    . clopidogrel (PLAVIX) 75 MG tablet Take 75 mg by mouth daily.    . metoprolol tartrate (LOPRESSOR) 25 MG tablet Take 1 tablet (25 mg total) by mouth 2 (two) times daily.    . pantoprazole (PROTONIX) 40 MG tablet Take 1 tablet (40 mg total) by mouth daily. 30 tablet 3  . PARoxetine (PAXIL) 40 MG tablet Take 40 mg by mouth daily.    . predniSONE (DELTASONE) 10 MG tablet Take 1 tablet (10 mg total) by mouth daily with breakfast. 90 tablet 3  . traZODone (DESYREL) 100 MG tablet Take 100 mg by mouth at bedtime.      Drug Regimen Review Drug regimen was reviewed and remains appropriate with no significant issues identified  Home: Home Living Family/patient expects to be discharged to:: Private residence Living Arrangements: Alone Available Help at Discharge: Family, Neighbor Type of Home: Apartment Home Access: Stairs to enter, Media planner Home Layout: One level Bathroom Shower/Tub: Chiropodist: Standard Home Equipment: Cane - single point, Grab bars - toilet, Grab bars - tub/shower, Hand held shower head Additional Comments: no animals   Functional History: Prior Function Level of Independence: Independent with assistive device(s) Comments: transportation via a neighbor  Functional Status:  Mobility: Bed Mobility Overal bed mobility: Modified Independent Bed Mobility: Supine to Sit, Sit to Supine, Rolling Rolling: Modified  independent (Device/Increase time) Supine to sit: Modified independent (Device/Increase time), HOB elevated Sit to supine: Modified independent (Device/Increase time), HOB elevated General bed mobility comments: Extra time, but able to come to transition supine <> sit EOB with HOB elevated safely. Rolling with utilization of bed rails. Transfers Overall transfer level: Needs assistance Equipment used: Straight cane Transfers: Sit to/from Stand, Stand Pivot Transfers Sit to Stand: Min assist Stand pivot transfers: Min assist General transfer comment: STS 1x and stand step transfers 2x with slightly increased time to power up to stand, but no overt LOB. MinA for safety as unsteadiness was noted. Ambulation/Gait Ambulation/Gait assistance: Min assist Gait Distance (Feet): 50 Feet Assistive device: Straight cane Gait Pattern/deviations: Step-through pattern, Decreased stride length General Gait Details: SPC in R hand, displaying appropriate use of cane. Displayed decreased stride length and moderate trunk sway which increased as distance progressed  and pt became SOB but SpO2 remained >/= 96%. Pt noted feeling weak and requested to sit down. MinA to maintain balance, esp when cued to rotate head during gait. Gait velocity: decreased Gait velocity interpretation: <1.8 ft/sec, indicate of risk for recurrent falls  ADL: ADL Overall ADL's : Needs assistance/impaired Grooming: Wash/dry face, Wash/dry hands, Oral care, Standing, Min guard Upper Body Bathing: Min guard Lower Body Dressing: Supervision/safety, Bed level Lower Body Dressing Details (indicate cue type and reason): able to don socks long sitting Toilet Transfer: Min guard, RW Functional mobility during ADLs: Min guard, Rolling walker General ADL Comments: pt asking when she can progress past RW use but also reports unsteady at this time time without it  Cognition: Cognition Overall Cognitive Status: Within Functional Limits for  tasks assessed Orientation Level: Oriented X4 Cognition Arousal/Alertness: Awake/alert Behavior During Therapy: WFL for tasks assessed/performed Overall Cognitive Status: Within Functional Limits for tasks assessed  Physical Exam: Blood pressure 108/86, pulse 68, temperature 97.7 F (36.5 C), temperature source Oral, resp. rate 15, height 5\' 4"  (1.626 m), weight 58.1 kg, SpO2 99 %. General: Alert and oriented x 3, No apparent distress, fatigued HEENT: Head is normocephalic, atraumatic, PERRLA, EOMI, sclera anicteric, oral mucosa pink and moist, dentition intact, ext ear canals clear,  Neck: Supple without JVD or lymphadenopathy Heart: Reg rate and rhythm. No murmurs rubs or gallops Chest: CTA bilaterally without wheezes, rales, or rhonchi; no distress Abdomen: Soft, non-tender, non-distended, bowel sounds positive. Extremities: Nonpitting edema, left>right Skin: Clean and intact without signs of breakdown Neuro: Patient is alert.  No acute distress and cooperative with exam.  Makes eye contact with examiner.  Oriented x3 and follows commands. 4+/5 strength throughout Psych: Pt's affect is appropriate. Pt is cooperative  Results for orders placed or performed during the hospital encounter of 09/17/20 (from the past 48 hour(s))  Renal function panel     Status: Abnormal   Collection Time: 09/22/20  9:57 AM  Result Value Ref Range   Sodium 136 135 - 145 mmol/L   Potassium 5.7 (H) 3.5 - 5.1 mmol/L   Chloride 98 98 - 111 mmol/L   CO2 21 (L) 22 - 32 mmol/L   Glucose, Bld 103 (H) 70 - 99 mg/dL    Comment: Glucose reference range applies only to samples taken after fasting for at least 8 hours.   BUN 65 (H) 6 - 20 mg/dL   Creatinine, Ser 7.64 (H) 0.44 - 1.00 mg/dL   Calcium 8.2 (L) 8.9 - 10.3 mg/dL   Phosphorus 7.9 (H) 2.5 - 4.6 mg/dL   Albumin 3.0 (L) 3.5 - 5.0 g/dL   GFR, Estimated 6 (L) >60 mL/min    Comment: (NOTE) Calculated using the CKD-EPI Creatinine Equation (2021)    Anion  gap 17 (H) 5 - 15    Comment: Performed at [facility] [address] [place], [place] [address]  CBC with Differential/Platelet     Status: Abnormal   Collection Time: 09/22/20 12:30 PM  Result Value Ref Range   WBC 12.4 (H) 4.0 - 10.5 K/uL   RBC 4.32 3.87 - 5.11 MIL/uL   Hemoglobin 12.3 12.0 - 15.0 g/dL   HCT 39.2 36 - 46 %   MCV 90.7 80.0 - 100.0 fL   MCH 28.5 26.0 - 34.0 pg   MCHC 31.4 30.0 - 36.0 g/dL   RDW 18.7 (H) 11.5 - 15.5 %   Platelets 99 (L) 150 - 400 K/uL    Comment: Immature  Platelet Fraction may be clinically indicated, consider ordering this additional test [id number]    nRBC 0.0 0.0 - 0.2 %   Neutrophils Relative % 70 %   Neutro Abs 8.8 (H) 1.7 - 7.7 K/uL   Lymphocytes Relative 10 %   Lymphs Abs 1.2 0.7 - 4.0 K/uL   Monocytes Relative 15 %   Monocytes Absolute 1.8 (H) 0.1 - 1.0 K/uL   Eosinophils Relative 1 %   Eosinophils Absolute 0.1 0.0 - 0.5 K/uL   Basophils Relative 1 %   Basophils Absolute 0.1 0.0 - 0.1 K/uL   Immature Granulocytes 3 %   Abs Immature Granulocytes 0.40 (H) 0.00 - 0.07 K/uL    Comment: Performed at [facility], [address], [place], [place] [address]  Culture, blood (routine x 2)     Status: None (Preliminary result)   Collection Time: 09/22/20  2:08 PM   Specimen: BLOOD  Result Value Ref Range   Specimen Description BLOOD LEFT ANTECUBITAL    Special Requests      AEROBIC BOTTLE ONLY Blood Culture results may not be optimal due to an inadequate volume of blood received in culture bottles   Culture      NO GROWTH 2 DAYS Performed at [facility] [address] [place], [place] [address]    Report Status PENDING   Culture, blood (routine x 2)     Status: None (Preliminary result)   Collection Time: 09/22/20  2:14 PM   Specimen: BLOOD LEFT HAND  Result Value Ref Range   Specimen Description BLOOD LEFT HAND    Special Requests AEROBIC BOTTLE ONLY Blood Culture adequate volume    Culture      NO GROWTH 2  DAYS Performed at [facility], [place] [address] [place], [place] [address]    Report Status PENDING   Renal function panel     Status: Abnormal   Collection Time: 09/23/20  1:54 AM  Result Value Ref Range   Sodium 136 135 - 145 mmol/L   Potassium 5.5 (H) 3.5 - 5.1 mmol/L   Chloride 100 98 - 111 mmol/L   CO2 20 (L) 22 - 32 mmol/L   Glucose, Bld 155 (H) 70 - 99 mg/dL    Comment: Glucose reference range applies only to samples taken after fasting for at least 8 hours.   BUN 77 (H) 6 - 20 mg/dL   Creatinine, Ser 8.83 (H) 0.44 - 1.00 mg/dL   Calcium 8.0 (L) 8.9 - 10.3 mg/dL   Phosphorus 7.9 (H) 2.5 - 4.6 mg/dL   Albumin 2.8 (L) 3.5 - 5.0 g/dL   GFR, Estimated 5 (L) >60 mL/min    Comment: (NOTE) Calculated using the CKD-EPI Creatinine Equation (2021)    Anion gap 16 (H) 5 - 15    Comment: Performed at [facility] [address], [place], [place] [address]  CBC     Status: Abnormal   Collection Time: 09/23/20  7:41 AM  Result Value Ref Range   WBC 11.9 (H) 4.0 - 10.5 K/uL   RBC 3.77 (L) 3.87 - 5.11 MIL/uL   Hemoglobin 12.7 12.0 - 15.0 g/dL   HCT 34.4 (L) 36 - 46 %   MCV 91.2 80.0 - 100.0 fL   MCH 33.7 26.0 - 34.0 pg   MCHC 36.9 (H) 30.0 - 36.0 g/dL   RDW 18.6 (H) 11.5 - 15.5 %   Platelets 95 (L) 150 - 400 K/uL  Comment: REPEATED TO VERIFY CONSISTENT WITH PREVIOUS RESULT    nRBC 0.0 0.0 - 0.2 %    Comment: Performed at [facility], [place] [address], [place], [place] [address]   EP PPM/ICD IMPLANT  Result Date: 09/22/2020 SURGEON:  Thompson Grayer, MD   PREPROCEDURE DIAGNOSIS:  Cryptogenic Stroke   POSTPROCEDURE DIAGNOSIS:  Cryptogenic Stroke    PROCEDURES:  1. Implantable loop recorder implantation   INTRODUCTION:  AYANA IMHOF is a 44 y.o. female with a history of unexplained stroke who presents today for implantable loop implantation.  The patient has had a cryptogenic stroke.  Despite an extensive workup by neurology, no reversible causes have been  identified.  she has worn telemetry during which she did not have arrhythmias.  There is significant concern for possible atrial fibrillation as the cause for the patients stroke.  The patient therefore presents today for implantable loop implantation.   DESCRIPTION OF PROCEDURE:  Informed written consent was obtained.  The patient required no sedation for the procedure today.  The patients left chest was prepped and draped. Mapping over the patient's chest was performed to identify the appropriate ILR site.  This area was found to be the left  parasternal region over the 3rd-4th intercostal space.  The skin overlying this region was infiltrated with lidocaine for local analgesia.  A 0.5-cm incision was made at the implant site.  A subcutaneous ILR pocket was fashioned using a combination of sharp and blunt dissection.  A Medtronic Reveal Linq model [id number] implantable loop recorder was then placed into the pocket R waves were very prominent and measured > 0.2 mV. EBL<1 ml.  Steri- Strips and a sterile dressing were then applied.  There were no early apparent complications.   CONCLUSIONS:  1. Successful implantation of a Medtronic Reveal LINQ implantable loop recorder for cryptogenic stroke  2. No early apparent complications. Thompson Grayer MD, [facility] 09/22/2020 3:04 PM   DG Chest Port 1 View  Result Date: 09/22/2020 CLINICAL DATA:  Leukocytosis EXAM: PORTABLE CHEST 1 VIEW COMPARISON:  Radiograph 09/17/2020 FINDINGS: Dual lumen right IJ catheter tip approximates the right atrium in similar position to prior. Implantable loop recorder projects over the left heart. Telemetry leads overlie the chest. Bandlike areas of opacity in the lung bases likely reflecting combination of subsegmental atelectasis or scarring. Suspect some small bilateral effusions with some adjacent passive atelectasis. Underlying airspace disease would be difficult to exclude though no other focal consolidative process is seen. Mild central vascular  congestion and cuffing is present which could bowl I some mild pulmonary edema. Cardiac contours are unchanged from prior with a mildly calcified aorta. 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 Vascular stenting in the left axilla without acute complication of the visible segment. Remaining soft tissues are free of acute abnormality. IMPRESSION: 1. Bandlike areas of opacity in the lung bases likely reflecting combination of subsegmental atelectasis or scarring. 2. Suspect least mild interstitial edema and small bilateral effusions with some adjacent passive atelectasis. Underlying airspace disease/infection cannot be fully excluded on radiography. Electronically Signed   By: Lovena Le M.D.   On: 09/22/2020 20:20    Medical Problem List and Plan: 1.  Vertigo, dizziness and unstable gait secondary to acute cerebellar vermis small infarct.  Status post loop recorder 11/4  -patient may shower  -ELOS/Goals: modI 10-12 days  -Admit to CIR 2.  Antithrombotics: -DVT/anticoagulation: SCDs  -antiplatelet therapy:  Aspirin 325 mg daily 3. Pain Management: Tylenol as needed 4. Mood: Paxil 40 mg daily, trazodone 100 mg nightly  -antipsychotic agents: N/A 5. Neuropsych: This patient is capable of making decisions on her own behalf. 6. Skin/Wound Care: Routine skin checks 7. Fluids/Electrolytes/Nutrition: Routine in and outs with follow-up chemistries 8.  End-stage renal disease.  Continue hemodialysis per renal services.  Outpatient fistulogram to be scheduled 9.  Lupus.  Chronic prednisone 10 mg daily.  Follow-up outpatient rheumatology services 10.  Mitral stenosis.  Follow-up cardiology services. Loop recorder placed 11/4. 11.  Hypertension.  Lopressor 25 mg twice daily, Cardizem 120 mg nightly.  Monitor with increased mobility. Has had low Bps w/ lightheadedness 11/4 and medications were decreased as a  result. 12.  Hyperlipidemia.  Lipitor 40mg  daily.  Izora Ribas, MD 09/24/2020

## 2020-09-24 NOTE — Progress Notes (Signed)
Physical Therapy Note  Patient Details  Name: [name] MRN: [id number] Date of Birth: 07/14/1976 Today's Date: 09/24/2020    Pt missed 45 min of skilled PT session due to being off unit for HD. Will continue POC.   Canary Brim Ivory Broad, PT, DPT, CBIS  09/24/2020, 3:01 PM

## 2020-09-24 NOTE — Progress Notes (Addendum)
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 Allergy   . Anemia   . Anxiety   . Arthritis   . Avascular necrosis of bone (HCC)    left tibial talus due to chronic prednisone use  . Cerebellar stroke ([place])   . Depression   . Dyspnea    too much  fluid  . ESRD (end stage renal disease) on dialysis ([place])    tthsat [facility]  . GERD (gastroesophageal reflux disease)   . Headache(784.0)   . History of high blood pressure   . Hyperlipemia   . Hypertension   . Lupus ([place])   . Pneumonia   . Secondary hyperparathyroidism (of renal origin)    Past Surgical History:  Past Surgical History:  Procedure Laterality Date  . A/V FISTULAGRAM Left 12/02/2017   Procedure: A/V FISTULAGRAM - Left upper;  Surgeon: Angelia Mould, MD;  Location: [place] [facility];  Service: Cardiovascular;  Laterality: Left;  . AV FISTULA PLACEMENT Left   . AV FISTULA PLACEMENT Right 01/26/2014   Procedure: ARTERIOVENOUS (AV) FISTULA CREATION; ultrasound guided;  Surgeon: Conrad Monticello, MD;  Location: [place];  Service: Vascular;  Laterality: Right;  . AV FISTULA PLACEMENT Right 04/25/2020   Procedure: RIGHT UPPER ARM  BASILIC VEIN ARTERIOVENOUS (AV) FISTULA;  Surgeon: [name], MD;  Location: [place];  Service: Vascular;  Laterality: Right;  . BASCILIC VEIN TRANSPOSITION Right 07/01/2020   Procedure: SECOND STAGE BASCILIC VEIN TRANSPOSITION RIGHT UPPER EXTREMITY;  Surgeon: [name], MD;  Location: [place];   Service: Vascular;  Laterality: Right;  . BUBBLE STUDY  09/20/2020   Procedure: BUBBLE STUDY;  Surgeon: Thayer Headings, MD;  Location: [facility] ENDOSCOPY;  Service: Cardiovascular;;  . ESOPHAGOGASTRODUODENOSCOPY N/A 03/17/2016   Procedure: ESOPHAGOGASTRODUODENOSCOPY (EGD);  Surgeon: Irene Shipper, MD;  Location: [facility] ENDOSCOPY;  Service: Endoscopy;  Laterality: N/A;  . FISTULOGRAM Left 09/24/2019   Procedure: FISTULOGRAM LEFT ARM;  Surgeon: [name], MD;  Location: [place];  Service: Vascular;  Laterality: Left;  . HEMATOMA EVACUATION Left 10/09/2013   Procedure: EVACUATION HEMATOMA;  Surgeon: Angelia Mould, MD;  Location: [place];  Service: Vascular;  Laterality: Left;  . HEMATOMA EVACUATION Left 09/24/2019   Procedure: EVACUATION HEMATOMA  LEFT ARM;  Surgeon: [name], MD;  Location: [place];  Service: Vascular;  Laterality: Left;  . INCISION AND DRAINAGE ABSCESS     gluteal  . INSERTION OF DIALYSIS CATHETER N/A 10/09/2013   Procedure: INSERTION OF DIALYSIS CATHETER; ULTRASOUND GUIDED;  Surgeon: Angelia Mould, MD;  Location: [place];  Service: Vascular;  Laterality: N/A;  . INSERTION OF DIALYSIS CATHETER Left 09/24/2019   Procedure: INSERTION OF TUNNELLED DIALYSIS CATHETER - PALINDROME 15FR X 28CM;  Surgeon: [name], MD;  Location: [place];  Service: Vascular;  Laterality: Left;  . INSERTION OF DIALYSIS CATHETER Right 03/15/2020   Procedure: Insertion Of Dialysis Catheter;  Surgeon: Angelia Mould, MD;  Location: [place];  Service: Cardiovascular;  Laterality: Right;  .  LOOP RECORDER INSERTION N/A 09/22/2020   Procedure: LOOP RECORDER INSERTION;  Surgeon: Thompson Grayer, MD;  Location: [place] [facility];  Service: Cardiovascular;  Laterality: N/A;  . PERIPHERAL VASCULAR BALLOON ANGIOPLASTY Left 12/02/2017   Procedure: PERIPHERAL VASCULAR BALLOON ANGIOPLASTY;  Surgeon: Angelia Mould, MD;  Location: [place] [facility];  Service: Cardiovascular;   Laterality: Left;  . REVISION OF ARTERIOVENOUS GORETEX GRAFT Left 04/03/2019   Procedure: REVISION OF ARTERIOVENOUS GORETEX GRAFT PSEUDOANEURYSM;  Surgeon: Angelia Mould, MD;  Location: [place];  Service: Vascular;  Laterality: Left;  . SHUNTOGRAM N/A 01/07/2014   Procedure: fistulogram with possibe venoplasty left upper arm avg;  Surgeon: Angelia Mould, MD;  Location[facility];  Service: Cardiovascular;  Laterality: N/A;  . TEE WITHOUT CARDIOVERSION N/A 09/20/2020   Procedure: TRANSESOPHAGEAL ECHOCARDIOGRAM (TEE);  Surgeon: Acie Fredrickson, Wonda Cheng, MD;  Location: [facility];  Service: Cardiovascular;  Laterality: N/A;  . THROMBECTOMY AND REVISION OF ARTERIOVENTOUS (AV) GORETEX  GRAFT Left 03/15/2020   Procedure: ATTEMPTED THROMBECTOMY OF LEFT ARM ARTERIOVENTOUS (AV) GORETEX  GRAFT;  Surgeon: Angelia Mould, MD;  Location: [place];  Service: Cardiovascular;  Laterality: Left;  . tibiocalcaneal fusion  left Left   . ULTRASOUND GUIDANCE FOR VASCULAR ACCESS  09/24/2019   Procedure: Ultrasound Guidance For Vascular Access;  Surgeon: [name], MD;  Location: [facility] OR;  Service: Vascular;;    Assessment & Plan Clinical Impression: 44 year old right-handed female with history of lupus, end-stage renal disease with hemodialysis, hypertension, depression and anxiety as well as 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  Patient lives alone 1 level home.  Independent with assistive device.  Patient does not drive and receive transportation via a neighbor.  She states she has a niece that assists as needed.  Presented 09/17/2020 with dizziness, nausea, vomiting as well as stool incontinence.  She initially presented to [facility] urgent care with dizziness and provided with antiemetics diagnosed with classic vertigo discharged home.  Patient with progressive symptoms and patient came to [facility] for further evaluation.  MRI of the brain showed subcentimeter acute infarct  involving the midline inferior vermis.  Chronic left parietal infarct.  CT angiogram of head and neck no hemodynamically significant stenosis in the neck.  No proximal intracranial vessel occlusion.  Admission chemistries potassium 5.3 glucose 170 BUN 56 creatinine 8.21 AST 78 ALT 105 alcohol negative.  WBC 12,100 hemoglobin 10.9 echocardiogram with severe left atrial dilation and mitral stenosis in the setting of significant annular calcifications.  TEE showed normal left ventricular function no AAS or thrombi.  Currently maintained on aspirin 325 mg for CVA prophylaxis and plan for loop recorder placement.  Hemodialysis ongoing as per renal services with plan schedule for outpatient fistulogram.  She is tolerating a regular consistency diet. Patient transferred to CIR on 09/23/2020 .    Patient currently requires min with basic self-care skills and IADL secondary to muscle weakness, decreased cardiorespiratoy endurance and impaired timing and sequencing and decreased coordination.  Prior to hospitalization, patient could complete BADLs/IADLs with modified independence.  Patient will benefit from skilled intervention to decrease level of assist with basic self-care skills and increase independence with basic self-care skills prior to discharge home with care partner.  Anticipate patient will require intermittent supervision and follow up home health.  OT - End of Session Activity Tolerance: Tolerates 30+ min activity with multiple rests Endurance Deficit: Yes OT Assessment Rehab Potential (ACUTE ONLY): Good OT Barriers to Discharge: Decreased caregiver support OT  Patient demonstrates impairments in the following area(s): Balance;Edema;Endurance;Motor;Pain OT Basic ADL's Functional Problem(s): Grooming;Bathing;Dressing;Toileting OT Advanced ADL's Functional Problem(s): Simple Meal Preparation;Laundry;Light Housekeeping OT Transfers Functional Problem(s): Toilet;Tub/Shower OT Additional Impairment(s):  None OT Plan OT Intensity: Minimum of 1-2 x/day, 45 to 90 minutes OT Frequency: 5 out of 7 days OT Duration/Estimated Length of Stay: 7-10 days OT Treatment/Interventions: Balance/vestibular training;Community reintegration;Discharge planning;Disease mangement/prevention;DME/adaptive equipment instruction;Functional mobility training;Pain management;Patient/family education;Self Care/advanced ADL retraining;Therapeutic Activities;Therapeutic Exercise;UE/LE Strength taining/ROM;UE/LE Coordination activities OT Self Feeding Anticipated Outcome(s): Independent OT Basic Self-Care Anticipated Outcome(s): Mod I OT Toileting Anticipated Outcome(s): Mod I OT Bathroom Transfers Anticipated Outcome(s): Mod I OT Recommendation Patient destination: Home Follow Up Recommendations: Home health OT;24 hour supervision/assistance Equipment Recommended: To be determined   OT Evaluation Precautions/Restrictions  Precautions Precautions: Fall Precaution Comments: orthostatic BP Restrictions Weight Bearing Restrictions: No General Chart Reviewed: Yes Family/Caregiver Present: No Vital Signs Therapy Vitals Temp: 97.7 F (36.5 C) Temp Source: Oral Pulse Rate: 68 Resp: 15 BP: 108/86 Patient Position (if appropriate): Lying Oxygen Therapy SpO2: 99 % O2 Device: Room Air Pain Pain Assessment Pain Scale: 0-10 Pain Score: 6  Pain Type: Acute pain Pain Location: Arm Pain Orientation: Left Pain Descriptors / Indicators: Other (Comment) (Pulling) Home Living/Prior [place] expects to be discharged to:: Private residence Living Arrangements: Alone Available Help at Discharge: Family, Neighbor Type of Home: Apartment Home Access: Stairs to enter, Media planner Home Layout: One level Bathroom Shower/Tub: Chiropodist: Standard  Lives With: Spouse IADL History Homemaking Responsibilities: Yes Meal Prep Responsibility: Therapist, occupational Responsibility:  Primary Cleaning Responsibility: Primary Current License: Yes Mode of Transportation: Car Occupation: On disability Prior Function Level of Independence: Independent with basic ADLs, Independent with homemaking with ambulation Driving: Yes Leisure: Hobbies-yes (Comment) Comments: Reading Vision Baseline Vision/History: Wears glasses Wears Glasses: At all times Patient Visual Report: Blurring of vision Vision Assessment?: Yes Eye Alignment: Within Functional Limits Ocular Range of Motion: Within Functional Limits Alignment/Gaze Preference: Within Defined Limits Tracking/Visual Pursuits: Able to track stimulus in all quads without difficulty Saccades: Within functional limits Convergence: Within functional limits Visual Fields: No apparent deficits Perception  Perception: Within Functional Limits Praxis Praxis: Intact Cognition Overall Cognitive Status: Within Functional Limits for tasks assessed Arousal/Alertness: Awake/alert Orientation Level: Person;Place;Situation Person: Oriented Place: Oriented Situation: Oriented Year: 2021 Month: November Day of Week: Correct Memory: Appears intact Immediate Memory Recall: Sock;Blue;Bed Memory Recall Sock: Without Cue Memory Recall Blue: Without Cue Memory Recall Bed: Without Cue Awareness: Appears intact Problem Solving: Appears intact Safety/Judgment: Appears intact Sensation Sensation Light Touch: Appears Intact Hot/Cold: Appears Intact Proprioception: Appears Intact Coordination Gross Motor Movements are Fluid and Coordinated: No Fine Motor Movements are Fluid and Coordinated: No Finger Nose Finger Test: Slight dysmetria R>L. Patient notes that this is baseline for her. Motor  Motor Motor: Within Functional Limits Motor - Skilled Clinical Observations: Slight dysmetria bilaterally  Trunk/Postural Assessment  Cervical Assessment Cervical Assessment: Within Functional Limits Thoracic Assessment Thoracic Assessment:  Within Functional Limits Lumbar Assessment Lumbar Assessment: Within Functional Limits Postural Control Postural Control: Deficits on evaluation Righting Reactions: Delayed  Balance Balance Balance Assessed: Yes Static Sitting Balance Static Sitting - Level of Assistance: 6: Modified independent (Device/Increase time) Dynamic Sitting Balance Dynamic Sitting - Level of Assistance: 5: Stand by assistance Sitting balance - Comments: Able to maintain static sitting balance EOB without trunk sway or LOB. Static Standing Balance Static Standing - Level of Assistance: 4: Min assist Dynamic Standing Balance Dynamic Standing - Level of Assistance: 4:  Min assist Extremity/Trunk Assessment RUE Assessment RUE Assessment: Within Functional Limits Passive Range of Motion (PROM) Comments: WFL Active Range of Motion (AROM) Comments: WFL General Strength Comments: MMT grossly 4-/5 at shoulder, elbow and wrist. 4+/5 at digits. LUE Assessment LUE Assessment: Exceptions to [facility] Passive Range of Motion (PROM) Comments: WFL Active Range of Motion (AROM) Comments: Limited shoulder flexion/abduction 2/2 pain at IV site General Strength Comments: MMT grossly 3+/5 at shoulder. 4-/5 at elbow and wrist. 4+/5 at digits.  Care Tool Care Tool Self Care Eating   Eating Assist Level: Set up assist    Oral Care    Oral Care Assist Level: Set up assist    Bathing   Body parts bathed by patient: Right arm;Left arm;Chest;Abdomen;Front perineal area;Buttocks;Right upper leg;Left upper leg;Face Body parts bathed by helper: Right lower leg;Left lower leg   Assist Level: Minimal Assistance - Patient > 75%    Upper Body Dressing(including orthotics)   What is the patient wearing?: Pull over shirt   Assist Level: Set up assist    Lower Body Dressing (excluding footwear)   What is the patient wearing?: Pants;Underwear/pull up Assist for lower body dressing: Minimal Assistance - Patient > 75%    Putting  on/Taking off footwear   What is the patient wearing?: Non-skid slipper socks Assist for footwear: Set up assist       Care Tool Toileting Toileting activity   Assist for toileting: Contact Guard/Touching assist     Care Tool Bed Mobility Roll left and right activity   Roll left and right assist level: Supervision/Verbal cueing    Sit to lying activity   Sit to lying assist level: Supervision/Verbal cueing    Lying to sitting edge of bed activity   Lying to sitting edge of bed assist level: Supervision/Verbal cueing     Care Tool Transfers Sit to stand transfer   Sit to stand assist level: Contact Guard/Touching assist    Chair/bed transfer   Chair/bed transfer assist level: Contact Guard/Touching assist     Toilet transfer   Assist Level: Contact Guard/Touching assist     Care Tool Cognition Expression of Ideas and Wants Expression of Ideas and Wants: Without difficulty (complex and basic) - expresses complex messages without difficulty and with speech that is clear and easy to understand   Understanding Verbal and Non-Verbal Content Understanding Verbal and Non-Verbal Content: Understands (complex and basic) - clear comprehension without cues or repetitions   Memory/Recall Ability *first 3 days only Memory/Recall Ability *first 3 days only: Current season;Location of own room;That he or she is in a hospital/hospital unit    Refer to Care Plan for [place] [facility] OT Short Term Goal 1 (Week 1): STG=LTG 2/2 ELOS  Recommendations for other services: Other: TBD  Patient met lying supine in bed in agreement with OT assessment. Patient education on process of rehab, ELOS, and role of OT. Patient expressed verbal understanding. Patient with questions about imaging and diagnosis. OT provided education on location of stroke and patient's deficits. Patient again expressed verbal understanding. Orthostatic vitals monitored with BP 108/86 in supine, 116/94  seated EOB, and 120/98 in standing. Bed mobility with supervision A for supine to EOB and sit to stand with CGA for safety without use of AD. Functional mobility to commode in bathroom and toilet transfer with CGA. Patient demonstrates some decreased dynamic standing balance and delayed righting reactions with mobility this date. Toileting/hygiene/clothing management in sitting/standing with CGA. At sink  level, patient completed UB bathing/dressing with set-up A and LB bathing/dressing with CGA to Min A. Return to supine with patient able to lower trunk and advance BLE from EOB to bed level. Session concluded with patient lying supine in bed with call bell within reach, bed alarm activated, and all needs met.   Skilled Therapeutic Intervention  ADL ADL Eating: Independent Where Assessed-Eating: Bed level Grooming: Contact guard Where Assessed-Grooming: Standing at sink Upper Body Bathing: Contact guard Where Assessed-Upper Body Bathing: Standing at sink Lower Body Bathing: Minimal assistance Where Assessed-Lower Body Bathing: Sitting at sink;Standing at sink Upper Body Dressing: Setup Where Assessed-Upper Body Dressing: Chair Lower Body Dressing: Minimal assistance Where Assessed-Lower Body Dressing: Chair Toileting: Contact guard Where Assessed-Toileting: Glass blower/designer: Therapist, music Method: Ambulating Tub/Shower Transfer: Unable to assess (Pt with dialysis port) Mobility  Bed Mobility Bed Mobility: Supine to Sit;Sit to Supine Supine to Sit: Supervision/Verbal cueing Sit to Supine: Supervision/Verbal cueing Transfers Sit to Stand: Contact Guard/Touching assist Stand to Sit: Contact Guard/Touching assist   Discharge Criteria: Patient will be discharged from OT if patient refuses treatment 3 consecutive times without medical reason, if treatment goals not met, if there is a change in medical status, if patient makes no progress towards goals or if patient is  discharged from hospital.  The above assessment, treatment plan, treatment alternatives and goals were discussed and mutually agreed upon: by patient  Easter Kennebrew R [place]-Davis 09/24/2020, 8:01 AM

## 2020-09-24 NOTE — Evaluation (Signed)
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 Allergy   . Anemia   . Anxiety   . Arthritis   . Avascular necrosis of bone (HCC)    left tibial talus due to chronic prednisone use  . Cerebellar stroke ([place])   . Depression   . Dyspnea    too much  fluid  . ESRD (end stage renal disease) on dialysis ([place])    tthsat [place]  . GERD (gastroesophageal reflux disease)   . Headache(784.0)   . History of high blood pressure   . Hyperlipemia   . Hypertension   . Lupus ([place])   . Pneumonia   . Secondary hyperparathyroidism (of renal origin)    Past Surgical History:  Past Surgical History:  Procedure Laterality Date  . A/V FISTULAGRAM Left 12/02/2017   Procedure: A/V FISTULAGRAM - Left upper;  Surgeon: Angelia Mould, MD;  Location: [place] [facility];  Service: Cardiovascular;  Laterality: Left;  . AV FISTULA PLACEMENT Left   . AV FISTULA PLACEMENT Right 01/26/2014   Procedure: ARTERIOVENOUS (AV) FISTULA CREATION; ultrasound guided;  Surgeon: Conrad [place], MD;  Location: [place];  Service: Vascular;  Laterality: Right;  . AV FISTULA PLACEMENT Right 04/25/2020   Procedure: RIGHT UPPER ARM  BASILIC VEIN ARTERIOVENOUS (AV) FISTULA;  Surgeon: [name], MD;  Location: [place];  Service: Vascular;  Laterality: Right;  . BASCILIC VEIN TRANSPOSITION Right 07/01/2020   Procedure: SECOND STAGE BASCILIC VEIN TRANSPOSITION RIGHT UPPER EXTREMITY;  Surgeon: [name], MD;  Location: [place];  Service:  Vascular;  Laterality: Right;  . BUBBLE STUDY  09/20/2020   Procedure: BUBBLE STUDY;  Surgeon: Thayer Headings, MD;  Location[facility] END[facility];  Service: Cardiovascular;;  . ESOPHAGOGASTRODUODENOSCOPY N/A 03/17/2016   Procedure: ESOPHAGOGASTRODUODENOSCOPY (EGD);  Surgeon: Irene Shipper, MD;  Location: [facility] ENDOSCOPY;  Service: Endoscopy;  Laterality: N/A;  . FISTULOGRAM Left 09/24/2019   Procedure: FISTULOGRAM LEFT ARM;  Surgeon: [name], MD;  Location: [place];  Service: Vascular;  Laterality: Left;  . HEMATOMA EVACUATION Left 10/09/2013   Procedure: EVACUATION HEMATOMA;  Surgeon: Angelia Mould, MD;  Location: [place];  Service: Vascular;  Laterality: Left;  . HEMATOMA EVACUATION Left 09/24/2019   Procedure: EVACUATION HEMATOMA  LEFT ARM;  Surgeon: [name], MD;  Location: [place];  Service: Vascular;  Laterality: Left;  . INCISION AND DRAINAGE ABSCESS     gluteal  . INSERTION OF DIALYSIS CATHETER N/A 10/09/2013   Procedure: INSERTION OF DIALYSIS CATHETER; ULTRASOUND GUIDED;  Surgeon: Angelia Mould, MD;  Location: [place];  Service: Vascular;  Laterality: N/A;  . INSERTION OF DIALYSIS CATHETER Left 09/24/2019   Procedure: INSERTION OF TUNNELLED DIALYSIS CATHETER - PALINDROME 15FR X 28CM;  Surgeon: [name], MD;  Location: [place];  Service: Vascular;  Laterality: Left;  . INSERTION OF DIALYSIS CATHETER Right 03/15/2020   Procedure: Insertion Of Dialysis Catheter;  Surgeon: Angelia Mould, MD;  Location: [place];  Service: Cardiovascular;  Laterality: Right;  . LOOP RECORDER INSERTION N/A  09/22/2020   Procedure: LOOP RECORDER INSERTION;  Surgeon: Thompson Grayer, MD;  Location: [place] [facility];  Service: Cardiovascular;  Laterality: N/A;  . PERIPHERAL VASCULAR BALLOON ANGIOPLASTY Left 12/02/2017   Procedure: PERIPHERAL VASCULAR BALLOON ANGIOPLASTY;  Surgeon: Angelia Mould, MD;  Location: [place] [facility];  Service: Cardiovascular;  Laterality:  Left;  . REVISION OF ARTERIOVENOUS GORETEX GRAFT Left 04/03/2019   Procedure: REVISION OF ARTERIOVENOUS GORETEX GRAFT PSEUDOANEURYSM;  Surgeon: Angelia Mould, MD;  Location: [place];  Service: Vascular;  Laterality: Left;  . SHUNTOGRAM N/A 01/07/2014   Procedure: fistulogram with possibe venoplasty left upper arm avg;  Surgeon: Angelia Mould, MD;  Location: [facility] CATH LAB;  Service: Cardiovascular;  Laterality: N/A;  . TEE WITHOUT CARDIOVERSION N/A 09/20/2020   Procedure: TRANSESOPHAGEAL ECHOCARDIOGRAM (TEE);  Surgeon: Acie Fredrickson, Wonda Cheng, MD;  Location: [place];  Service: Cardiovascular;  Laterality: N/A;  . THROMBECTOMY AND REVISION OF ARTERIOVENTOUS (AV) GORETEX  GRAFT Left 03/15/2020   Procedure: ATTEMPTED THROMBECTOMY OF LEFT ARM ARTERIOVENTOUS (AV) GORETEX  GRAFT;  Surgeon: Angelia Mould, MD;  Location: [place];  Service: Cardiovascular;  Laterality: Left;  . tibiocalcaneal fusion  left Left   . ULTRASOUND GUIDANCE FOR VASCULAR ACCESS  09/24/2019   Procedure: Ultrasound Guidance For Vascular Access;  Surgeon: [name], MD;  Location: [facility] OR;  Service: Vascular;;    Assessment & Plan Clinical Impression: Patient is a 44 year old right-handed female with history of lupus, end-stage renal disease with hemodialysis, hypertension, depression and anxiety as well as 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 Patient lives alone 1 level home. Independent with assistive device. Patient does not drive and receive transportation via a neighbor. She states she has a niece that assists as needed. Presented 09/17/2020 with dizziness, nausea, vomiting as well as stool incontinence. She initially presented to [facility] urgent care with dizziness and provided with antiemetics diagnosed with classic vertigo discharged home. Patient with progressive symptoms and patient came to [facility] for further evaluation. MRI of the brain showed subcentimeter acute infarct  involving the midline inferior vermis. Chronic left parietal infarct. CT angiogram of head and neck no hemodynamically significant stenosis in the neck. No proximal intracranial vessel occlusion. Admission chemistries potassium 5.3 glucose 170 BUN 56 creatinine 8.21 AST 78 ALT 105 alcohol negative. WBC 12,100 hemoglobin 10.9 echocardiogram with severe left atrial dilation and mitral stenosis in the setting of significant annular calcifications. TEE showed normal left ventricular function no AAS or thrombi. Currently maintained on aspirin 325 mg for CVA prophylaxis and plan for loop recorder placement. Hemodialysis ongoing as per renal services with plan schedule for outpatient fistulogram. She is tolerating a regular consistency diet. Patient transferred to CIR on 09/23/2020 .   Patient currently requires min with mobility secondary to muscle weakness and muscle joint tightness, decreased cardiorespiratoy endurance and decreased standing balance, decreased postural control and decreased balance strategies.  Prior to hospitalization, patient was modified independent  with mobility and lived with Alone (niece will stay with her at d/c) in a [place] home.  Home access is  Stairs to enter, Media planner.  Patient will benefit from skilled PT intervention to maximize safe functional mobility, minimize fall risk and decrease caregiver burden for planned discharge home with 24 hour supervision.  Anticipate patient will benefit from follow up [place] at discharge.  PT - End of Session Activity Tolerance: Decreased this session Endurance Deficit: Yes Endurance Deficit Description: rest breaks needed due to fatigue PT Assessment Rehab Potential (ACUTE/IP  ONLY): Good PT Patient demonstrates impairments in the following area(s): Balance;Edema;Endurance;Motor;Sensory PT Transfers Functional Problem(s): Bed Mobility;Bed to Chair;Car;Furniture;Floor PT Locomotion Functional Problem(s): Ambulation;Stairs PT Plan PT  Intensity: Minimum of 1-2 x/day ,45 to 90 minutes PT Frequency: 5 out of 7 days PT Duration Estimated Length of Stay: 5-7 days PT Treatment/Interventions: Ambulation/gait training;Balance/vestibular training;Community reintegration;Discharge planning;Disease management/prevention;DME/adaptive equipment instruction;Functional mobility training;Neuromuscular re-education;Pain management;Patient/family education;Psychosocial support;Skin care/wound management;Splinting/orthotics;Stair training;Therapeutic Activities;Therapeutic Exercise;UE/LE Strength taining/ROM;UE/LE Coordination activities;Wheelchair propulsion/positioning PT Transfers Anticipated Outcome(s): mod I basic transfers PT Locomotion Anticipated Outcome(s): mod I household mobility; supervision stairs PT Recommendation Recommendations for Other Services: Therapeutic Recreation consult Therapeutic Recreation Interventions: Outing/community reintergration Follow Up Recommendations: Home health PT Patient destination: Home Equipment Recommended: To be determined Equipment Details: has SPC at home   PT Evaluation Precautions/Restrictions Precautions Precautions: Fall Restrictions Weight Bearing Restrictions: No General   Vital SignsTherapy Vitals Pulse Rate: 74 BP: 130/96 Patient Position (if appropriate): Lying  Pain   Home Living/Prior Functioning Home Living Available Help at Discharge: Available 24 hours/day Type of Home: Apartment Home Access: Stairs to enter;Elevator Home Layout: One level  Lives With: Alone (niece will stay with her at d/c) Prior Function Level of Independence: Independent with basic ADLs;Independent with homemaking with ambulation;Independent with gait;Requires assistive device for independence Driving: Yes Vision/Perception  Vision - History Baseline Vision: Wears glasses all the time Patient Visual Report:  (reports some blurring from a distance) Perception Perception: Within Functional  Limits  Cognition Overall Cognitive Status: Within Functional Limits for tasks assessed Orientation Level: Oriented X4 Safety/Judgment: Appears intact Sensation Sensation Light Touch: Appears Intact (reports tingling on bottoms of Bilateral feet) Proprioception: Appears Intact Coordination Gross Motor Movements are Fluid and Coordinated: Yes Motor  Motor Motor: Abnormal postural alignment and control;Other (comment) Motor - Skilled Clinical Observations: generalized weakness and decreased balance strategies. Reports L slightly weaker   Trunk/Postural Assessment  Cervical Assessment Cervical Assessment: Within Functional Limits Thoracic Assessment Thoracic Assessment: Within Functional Limits Lumbar Assessment Lumbar Assessment: Within Functional Limits Postural Control Postural Control: Deficits on evaluation (decreased balance strategies)  Balance Balance Balance Assessed: Yes Standardized Balance Assessment Standardized Balance Assessment: Timed Up and Go Test;Berg Balance Test Berg Balance Test Sit to Stand: Able to stand using hands after several tries Standing Unsupported: Able to stand 2 minutes with supervision Sitting with Back Unsupported but Feet Supported on Floor or Stool: Able to sit safely and securely 2 minutes Stand to Sit: Controls descent by using hands Transfers: Needs one person to assist Standing Unsupported with Eyes Closed: Able to stand 10 seconds with supervision Standing Ubsupported with Feet Together: Needs help to attain position but able to stand for 30 seconds with feet together From Standing, Reach Forward with Outstretched Arm: Can reach forward >12 cm safely (5") From Standing Position, Pick up Object from Floor: Unable to try/needs assist to keep balance (able to pick up with CGA) From Standing Position, Turn to Look Behind Over each Shoulder: Needs supervision when turning Turn 360 Degrees: Needs close supervision or verbal cueing Standing  Unsupported, Alternately Place Feet on Step/Stool: Able to complete >2 steps/needs minimal assist Standing Unsupported, One Foot in Front: Loses balance while stepping or standing Standing on One Leg: Tries to lift leg/unable to hold 3 seconds but remains standing independently Total Score: 24 Timed Up and Go Test TUG: Normal TUG (avg 3 trials) Normal TUG (seconds): 25 Static Sitting Balance Static Sitting - Level of Assistance: 6: Modified independent (Device/Increase time) Dynamic Sitting Balance Dynamic Sitting - Level of  Assistance: 5: Stand by assistance Static Standing Balance Static Standing - Level of Assistance: 4: Min assist Dynamic Standing Balance Dynamic Standing - Level of Assistance: 4: Min assist Extremity Assessment      RLE Assessment RLE Assessment: Exceptions to [facility] General Strength Comments: grossly 3+/5 LLE Assessment LLE Assessment: Exceptions to [facility] General Strength Comments: grossly 3+/5; edema in L ankle noted   Evaluation completed and education on PT POC and goals and individual treatment initiated.   Care Tool Care Tool Bed Mobility Roll left and right activity   Roll left and right assist level: Supervision/Verbal cueing    Sit to lying activity   Sit to lying assist level: Supervision/Verbal cueing    Lying to sitting edge of bed activity   Lying to sitting edge of bed assist level: Supervision/Verbal cueing     Care Tool Transfers Sit to stand transfer   Sit to stand assist level: Contact Guard/Touching assist    Chair/bed transfer   Chair/bed transfer assist level: Minimal Assistance - Patient > 75%     Physiological scientist transfer assist level: Minimal Assistance - Patient > 75%      Care Tool Locomotion Ambulation   Assist level: Minimal Assistance - Patient > 75% Assistive device: No Device Max distance: 150'  Walk 10 feet activity   Assist level: Minimal Assistance - Patient > 75% Assistive device:  No Device   Walk 50 feet with 2 turns activity   Assist level: Minimal Assistance - Patient > 75% Assistive device: No Device  Walk 150 feet activity   Assist level: Minimal Assistance - Patient > 75% Assistive device: No Device  Walk 10 feet on uneven surfaces activity   Assist level: Minimal Assistance - Patient > 75%    Stairs   Assist level: Contact Guard/Touching assist Stairs assistive device: 2 hand rails Max number of stairs: 8  Walk up/down 1 step activity   Walk up/down 1 step (curb) assist level: Contact Guard/Touching assist Walk up/down 1 step or curb assistive device: 2 hand rails    Walk up/down 4 steps activity Walk up/down 4 steps assist level: Contact Guard/Touching assist Walk up/down 4 steps assistive device: 2 hand rails  Walk up/down 12 steps activity Walk up/down 12 steps activity did not occur: Safety/medical concerns (endurance)      Pick up small objects from floor   Pick up small object from the floor assist level: Contact Guard/Touching assist    Wheelchair Will patient use wheelchair at discharge?: No          Wheel 50 feet with 2 turns activity      Wheel 150 feet activity        Refer to Care Plan for Long Term Goals  SHORT TERM GOAL WEEK 1 PT Short Term Goal 1 (Week 1): = LTGs  Recommendations for other services: Therapeutic Recreation  Outing/community reintegration  Skilled Therapeutic Intervention Mobility Bed Mobility Bed Mobility: Rolling Right;Supine to Sit;Sit to Supine Rolling Right: Supervision/verbal cueing Supine to Sit: Supervision/Verbal cueing Sit to Supine: Supervision/Verbal cueing Transfers Transfers: Sit to Stand;Stand to Sit;Stand Pivot Transfers Sit to Stand: Contact Guard/Touching assist Stand to Sit: Contact Guard/Touching assist Stand Pivot Transfers: Minimal Assistance - Patient > 75% Locomotion  Gait Gait Distance (Feet): 150 Feet Assistive device: None Gait Gait Pattern: Impaired Stairs /  Additional Locomotion Stairs: Yes Stairs Assistance: Contact Guard/Touching assist Stair Management Technique: Two rails Number of  Stairs: 8 Height of Stairs: 6 Ramp: Minimal Assistance - Patient >75% Wheelchair Mobility Wheelchair Mobility: No   Functional transfers throughout session with cues and facilitation for weightshift due to impaired overall balance without UE support. Discussed fall risk assessment and current recommendation in therapies to practice without device to progress overall balance and strength. Pt in agreement. Will continue to assess if SPC vs RW will be recommended at d/c. During gait without device, pt with slow gait velocity, shorter step length, but able to perform step through. Min assist for occasional mild LOB during turning or in distracting environments.      Discharge Criteria: Patient will be discharged from PT if patient refuses treatment 3 consecutive times without medical reason, if treatment goals not met, if there is a change in medical status, if patient makes no progress towards goals or if patient is discharged from hospital.  The above assessment, treatment plan, treatment alternatives and goals were discussed and mutually agreed upon: by patient  Juanna Cao, PT, DPT, CBIS  09/24/2020, 12:23 PM

## 2020-09-24 NOTE — Progress Notes (Signed)
[facility] Progress Note   Subjective:  Patient feels well with no complaints.  Working with rehab  Objective Vitals:   09/23/20 2130 09/23/20 2227 09/24/20 0520 09/24/20 0712  BP: (!) 131/94  109/86 108/86  Pulse: 80  70 68  Resp: 18  15   Temp: 97.9 F (36.6 C)  97.7 F (36.5 C)   TempSrc: Oral  Oral   SpO2: 100%  100% 99%  Weight:  58.1 kg    Height:  5\' 4"  (1.626 m)       Additional Objective Labs: Basic Metabolic Panel: Recent Labs  Lab 09/21/20 1810 09/22/20 0957 09/23/20 0154  NA 137 136 136  K 5.2* 5.7* 5.5*  CL 101 98 100  CO2 20* 21* 20*  GLUCOSE 188* 103* 155*  BUN 50* 65* 77*  CREATININE 6.64* 7.64* 8.83*  CALCIUM 8.1* 8.2* 8.0*  PHOS 6.6* 7.9* 7.9*   CBC: Recent Labs  Lab 09/17/20 1303 09/17/20 1311 09/19/20 0242 09/19/20 0242 09/20/20 0511 09/20/20 0511 09/21/20 1810 09/22/20 1230 09/23/20 0741  WBC 12.1*   < > 10.9*   < > 10.3   < > 11.9* 12.4* 11.9*  NEUTROABS 9.9*  --   --   --   --   --   --  8.8*  --   HGB 10.9*   < > 10.4*   < > 10.3*   < > 11.8* 12.3 12.7  HCT 35.1*   < > 33.6*   < > 32.7*   < > 38.0 39.2 34.4*  MCV 91.9   < > 91.6  --  90.1  --  91.1 90.7 91.2  PLT 78*   < > 74*   < > 75*   < > 106* 99* 95*   < > = values in this interval not displayed.   Blood Culture    Component Value Date/Time   SDES BLOOD LEFT HAND 09/22/2020 1414   SPECREQUEST AEROBIC BOTTLE ONLY Blood Culture adequate volume 09/22/2020 1414   CULT  09/22/2020 1414    NO GROWTH 2 DAYS Performed at [facility], [place] [address], [place], [place] [address]    REPTSTATUS PENDING 09/22/2020 1414     Physical Exam General: lying in bed no distress Heart: normal rate Lungs: bilateral chest rise, no iwob Abdomen: Soft non-tender  Extremities: No lower extremity edema Dialysis Access: R IJ TDC dsg c,d,i; R AVF +bruit   Medications:  . aspirin EC  325 mg Oral Daily  . atorvastatin  40 mg Oral Daily  . calcium acetate (Phos  Binder)  1,334 mg Oral TID WC  . Chlorhexidine Gluconate Cloth  6 each Topical BID  . metoprolol tartrate  25 mg Oral BID  . pantoprazole  40 mg Oral Daily  . PARoxetine  40 mg Oral Daily  . predniSONE  10 mg Oral Q breakfast  . traZODone  100 mg Oral QHS    Dialysis Orders:  [facility] TTS 3h 400/600 2K/2.5Ca EDW 58kg TDC Hep bolus 1800 EPO 6600 q HD Venofer 50 q Mon  Assessment/Plan: 1. CVA. MRI showing subcentimeter acute infarct involving the midline inferior vermis. Chronic left parietal infarct -per neuro/primary team. On antiplatelet therapy.  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  Cardiology reviewed further and thinks this is simply calcification.  Undergoing implanted loop recorder to rule out atrial fibrillation. Cardiolipin IgM is elevated. Defer to other providers regarding consideration of hypercoagulable state requiring anticoagulation 2. ESRD -  HD  TTS via TDC.  Short session of dialysis today to resume her outpatient schedule. OP fistulogram rescheduled 3. Hypertension/volume  -lower blood pressure recently.  Stop diltiazem.  Continue metoprolol but would hold if systolic blood pressures less than 110. 4. Anemia  - Hgb acceptable. No ESA needs currently  5. Metabolic bone disease -  Ca ok. Continue home binders (phoslyra). No VDRA.  6.  SLE - anti-dsdna norma, [place] and [place] high.  Anticardiolipin IgM high 7. Mitral stenosis: Conservative management at this time.  Cardiology following

## 2020-09-24 NOTE — Progress Notes (Signed)
[place] PHYSICAL MEDICINE & REHABILITATION PROGRESS NOTE   Subjective/Complaints: No complaints this morning Appreciate pharmacy medication review- restarted Plavix and B complex/vitamin C/folive acid, home dose of Phoslyra  ROS: denies pain  Objective:   DG Chest Port 1 View  Result Date: 09/22/2020 CLINICAL DATA:  Leukocytosis EXAM: PORTABLE CHEST 1 VIEW COMPARISON:  Radiograph 09/17/2020 FINDINGS: Dual lumen right IJ catheter tip approximates the right atrium in similar position to prior. Implantable loop recorder projects over the left heart. Telemetry leads overlie the chest. Bandlike areas of opacity in the lung bases likely reflecting combination of subsegmental atelectasis or scarring. Suspect some small bilateral effusions with some adjacent passive atelectasis. Underlying airspace disease would be difficult to exclude though no other focal consolidative process is seen. Mild central vascular congestion and cuffing is present which could bowl I some mild pulmonary edema. Cardiac contours are unchanged from prior with a mildly calcified aorta. 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 Vascular stenting in the left axilla without acute complication of the visible segment. Remaining soft tissues are free of acute abnormality. IMPRESSION: 1. Bandlike areas of opacity in the lung bases likely reflecting combination of subsegmental atelectasis or scarring. 2. Suspect least mild interstitial edema and small bilateral effusions with some adjacent passive atelectasis. Underlying airspace disease/infection cannot be fully excluded on radiography. Electronically Signed   By: Lovena Le M.D.   On: 09/22/2020 20:20   Recent Labs    09/23/20 0741 09/24/20 1436  WBC 11.9* 9.0  HGB 12.7 9.8*  HCT 34.4* 31.3*  PLT 95* 99*   Recent Labs    09/23/20 0154 09/24/20 1437  NA 136 139  K 5.5* 3.5   CL 100 101  CO2 20* 26  GLUCOSE 155* 168*  BUN 77* 42*  CREATININE 8.83* 5.93*  CALCIUM 8.0* 8.0*    Intake/Output Summary (Last 24 hours) at 09/24/2020 1757 Last data filed at 09/24/2020 1300 Gross per 24 hour  Intake 480 ml  Output --  Net 480 ml        Physical Exam: Vital Signs Blood pressure (!) (P) 126/92, pulse (P) 69, temperature 98.2 F (36.8 C), temperature source Oral, resp. rate 16, height 5\' 4"  (1.626 m), weight 58.8 kg, SpO2 96 %. General: Alert and oriented x 3, No apparent distress HEENT: Head is normocephalic, atraumatic, PERRLA, EOMI, sclera anicteric, oral mucosa pink and moist, dentition intact, ext ear canals clear,  Neck: Supple without JVD or lymphadenopathy Heart: Reg rate and rhythm. No murmurs rubs or gallops Chest: CTA bilaterally without wheezes, rales, or rhonchi; no distress Abdomen: Soft, non-tender, non-distended, bowel sounds positive. Extremities: Nonpitting edema, left>right Skin: Clean and intact without signs of breakdown Neuro: Patient is alert.  No acute distress and cooperative with exam.  Makes eye contact with examiner.  Oriented x3 and follows commands. 4+/5 strength throughout Psych: Pt's affect is appropriate. Pt is cooperative    Assessment/Plan: 1. Functional deficits secondary to CVA which require 3+ hours per day of interdisciplinary therapy in a comprehensive inpatient rehab setting.  Physiatrist is providing close team supervision and 24 hour management of active medical problems listed below.  Physiatrist and rehab team continue to assess barriers to discharge/monitor patient progress toward functional and medical goals  Care Tool:  Bathing    Body parts bathed by patient: Right arm, Left arm, Chest, Abdomen, Front perineal area, Buttocks, Right upper leg, Left upper leg, Face  Body parts bathed by helper: Right lower leg, Left lower leg     Bathing assist Assist Level: Minimal Assistance - Patient > 75%      Upper Body Dressing/Undressing Upper body dressing   What is the patient wearing?: Pull over shirt    Upper body assist Assist Level: Set up assist    Lower Body Dressing/Undressing Lower body dressing      What is the patient wearing?: Pants, Underwear/pull up     Lower body assist Assist for lower body dressing: Minimal Assistance - Patient > 75%     Toileting Toileting    Toileting assist Assist for toileting: Contact Guard/Touching assist     Transfers Chair/bed transfer  Transfers assist     Chair/bed transfer assist level: Minimal Assistance - Patient > 75%     Locomotion Ambulation   Ambulation assist      Assist level: Minimal Assistance - Patient > 75% Assistive device: No Device Max distance: 150'   Walk 10 feet activity   Assist     Assist level: Minimal Assistance - Patient > 75% Assistive device: No Device   Walk 50 feet activity   Assist    Assist level: Minimal Assistance - Patient > 75% Assistive device: No Device    Walk 150 feet activity   Assist    Assist level: Minimal Assistance - Patient > 75% Assistive device: No Device    Walk 10 feet on uneven surface  activity   Assist     Assist level: Minimal Assistance - Patient > 75%     Wheelchair     Assist Will patient use wheelchair at discharge?: No             Wheelchair 50 feet with 2 turns activity    Assist            Wheelchair 150 feet activity     Assist          Blood pressure (!) (P) 126/92, pulse (P) 69, temperature 98.2 F (36.8 C), temperature source Oral, resp. rate 16, height 5\' 4"  (1.626 m), weight 58.8 kg, SpO2 96 %.    Medical Problem List and Plan: 1.  Vertigo, dizziness and unstable gait secondary to acute cerebellar vermis small infarct.  Status post loop recorder 11/4             -patient may shower             -ELOS/Goals: modI 10-12 days             -Initial CIR therapies today 2.   Antithrombotics: -DVT/anticoagulation: SCDs             -antiplatelet therapy: Aspirin 325 mg daily 3. Pain Management: Tylenol as needed. Well controlled 4. Mood: Paxil 40 mg daily, trazodone 100 mg nightly             -antipsychotic agents: N/A 5. Neuropsych: This patient is capable of making decisions on her own behalf. 6. Skin/Wound Care: Routine skin checks 7. Fluids/Electrolytes/Nutrition: Routine in and outs with follow-up chemistries 8.  End-stage renal disease.  Continue hemodialysis per renal services.  Outpatient fistulogram to be scheduled. T/Th/Sa schedule. 9.  Lupus.  Chronic prednisone 10 mg daily.  Follow-up outpatient rheumatology services 10.  Mitral stenosis.  Follow-up cardiology services. Loop recorder placed 11/4. 11.  Hypertension.  Lopressor 25 mg twice daily, Cardizem 120 mg nightly.  Monitor with increased mobility. Has had low Bps w/ lightheadedness 11/4 and medications were  decreased as a result.  11/6: BP is currently with normal systolic and elevated diastolic.  12.  Hyperlipidemia.  Lipitor 40mg  daily.  LOS: 1 days A FACE TO FACE EVALUATION WAS PERFORMED  [name] 09/24/2020, 5:57 PM

## 2020-09-25 NOTE — Progress Notes (Signed)
[place] PHYSICAL MEDICINE & REHABILITATION PROGRESS NOTE   Subjective/Complaints: No complaints this morning. Denies pain, constipation, insomnia.   ROS: denies pain  Objective:   No results found. Recent Labs    09/23/20 0741 09/24/20 1436  WBC 11.9* 9.0  HGB 12.7 9.8*  HCT 34.4* 31.3*  PLT 95* 99*   Recent Labs    09/23/20 0154 09/24/20 1437  NA 136 139  K 5.5* 3.5  CL 100 101  CO2 20* 26  GLUCOSE 155* 168*  BUN 77* 42*  CREATININE 8.83* 5.93*  CALCIUM 8.0* 8.0*    Intake/Output Summary (Last 24 hours) at 09/25/2020 1523 Last data filed at 09/25/2020 1334 Gross per 24 hour  Intake 720 ml  Output 800 ml  Net -80 ml        Physical Exam: Vital Signs Blood pressure 124/88, pulse 79, temperature 97.8 F (36.6 C), resp. rate 15, height 5\' 4"  (1.626 m), weight 58.8 kg, SpO2 100 %.  General: Alert and oriented x 3, No apparent distress HEENT: Head is normocephalic, poor dentition Neck: Supple without JVD or lymphadenopathy Heart: Reg rate and rhythm. No murmurs rubs or gallops Chest: CTA bilaterally without wheezes, rales, or rhonchi; no distress Abdomen: Soft, non-tender, non-distended, bowel sounds positive.  Extremities: Nonpitting edema, left>right Skin: Clean and intact without signs of breakdown Neuro: Patient is alert.  No acute distress and cooperative with exam.  Makes eye contact with examiner.  Oriented x3 and follows commands. 4+/5 strength throughout Psych: Pt's affect is appropriate. Pt is cooperative    Assessment/Plan: 1. Functional deficits secondary to CVA which require 3+ hours per day of interdisciplinary therapy in a comprehensive inpatient rehab setting.  Physiatrist is providing close team supervision and 24 hour management of active medical problems listed below.  Physiatrist and rehab team continue to assess barriers to discharge/monitor patient progress toward functional and medical goals  Care Tool:  Bathing    Body  parts bathed by patient: Right arm, Left arm, Chest, Abdomen, Front perineal area, Buttocks, Right upper leg, Left upper leg, Face   Body parts bathed by helper: Right lower leg, Left lower leg     Bathing assist Assist Level: Minimal Assistance - Patient > 75%     Upper Body Dressing/Undressing Upper body dressing   What is the patient wearing?: Pull over shirt    Upper body assist Assist Level: Set up assist    Lower Body Dressing/Undressing Lower body dressing      What is the patient wearing?: Pants     Lower body assist Assist for lower body dressing: Minimal Assistance - Patient > 75%     Toileting Toileting    Toileting assist Assist for toileting: Contact Guard/Touching assist     Transfers Chair/bed transfer  Transfers assist     Chair/bed transfer assist level: Minimal Assistance - Patient > 75%     Locomotion Ambulation   Ambulation assist      Assist level: Minimal Assistance - Patient > 75% Assistive device: No Device Max distance: 150'   Walk 10 feet activity   Assist     Assist level: Minimal Assistance - Patient > 75% Assistive device: No Device   Walk 50 feet activity   Assist    Assist level: Minimal Assistance - Patient > 75% Assistive device: No Device    Walk 150 feet activity   Assist    Assist level: Minimal Assistance - Patient > 75% Assistive device: No Device    Walk  10 feet on uneven surface  activity   Assist     Assist level: Minimal Assistance - Patient > 75%     Wheelchair     Assist Will patient use wheelchair at discharge?: No             Wheelchair 50 feet with 2 turns activity    Assist            Wheelchair 150 feet activity     Assist          Blood pressure 124/88, pulse 79, temperature 97.8 F (36.6 C), resp. rate 15, height 5\' 4"  (1.626 m), weight 58.8 kg, SpO2 100 %.    Medical Problem List and Plan: 1.  Vertigo, dizziness and unstable gait secondary  to acute cerebellar vermis small infarct.  Status post loop recorder 11/4             -patient may shower             -ELOS/Goals: modI 10-12 days             -Continue CIR 2.  Antithrombotics: -DVT/anticoagulation: SCDs             -antiplatelet therapy: Aspirin 325 mg daily 3. Pain Management: Tylenol as needed. Well controlled 4. Mood: Paxil 40 mg daily, trazodone 100 mg nightly. In positive mood.             -antipsychotic agents: N/A 5. Neuropsych: This patient is capable of making decisions on her own behalf. 6. Skin/Wound Care: Routine skin checks 7. Fluids/Electrolytes/Nutrition: Routine in and outs with follow-up chemistries 8.  End-stage renal disease.  Continue hemodialysis per renal services.  Outpatient fistulogram to be scheduled. T/Th/Sa schedule. 9.  Lupus.  Chronic prednisone 10 mg daily.  Follow-up outpatient rheumatology services 10.  Mitral stenosis.  Follow-up cardiology services. Loop recorder placed 11/4. 11.  Hypertension.  Lopressor 25 mg twice daily, Cardizem 120 mg nightly.  Monitor with increased mobility. Has had low Bps w/ lightheadedness 11/4 and medications were decreased as a result.  11/7: elevated diastolic- continue to monitor  12.  Hyperlipidemia.  Lipitor 40mg  daily.  LOS: 2 days A FACE TO FACE EVALUATION WAS PERFORMED  Clide Deutscher [name] 09/25/2020, 3:23 PM

## 2020-09-25 NOTE — Progress Notes (Addendum)
[facility] Progress Note   Subjective:  States the rehab is going well.  No issues with dialysis yesterday.  Was able to walk some stairs  Objective Vitals:   09/24/20 1713 09/24/20 1800 09/24/20 2029 09/25/20 0302  BP: 133/71 (!) 126/96 (!) 141/93 (!) 129/97  Pulse: 71 78 79 76  Resp: 16 18 17 18   Temp: 98.4 F (36.9 C) 98.3 F (36.8 C) 98.4 F (36.9 C) 98.9 F (37.2 C)  TempSrc: Oral Oral  Oral  SpO2:  100% 100% 100%  Weight:      Height:         Additional Objective Labs: Basic Metabolic Panel: Recent Labs  Lab 09/22/20 0957 09/23/20 0154 09/24/20 1437  NA 136 136 139  K 5.7* 5.5* 3.5  CL 98 100 101  CO2 21* 20* 26  GLUCOSE 103* 155* 168*  BUN 65* 77* 42*  CREATININE 7.64* 8.83* 5.93*  CALCIUM 8.2* 8.0* 8.0*  PHOS 7.9* 7.9* 4.4   CBC: Recent Labs  Lab 09/20/20 0511 09/20/20 0511 09/21/20 1810 09/21/20 1810 09/22/20 1230 09/23/20 0741 09/24/20 1436  WBC 10.3   < > 11.9*   < > 12.4* 11.9* 9.0  NEUTROABS  --   --   --   --  8.8*  --   --   HGB 10.3*   < > 11.8*   < > 12.3 12.7 9.8*  HCT 32.7*   < > 38.0   < > 39.2 34.4* 31.3*  MCV 90.1  --  91.1  --  90.7 91.2 93.2  PLT 75*   < > 106*   < > 99* 95* 99*   < > = values in this interval not displayed.   Blood Culture    Component Value Date/Time   SDES BLOOD LEFT HAND 09/22/2020 1414   SPECREQUEST AEROBIC BOTTLE ONLY Blood Culture adequate volume 09/22/2020 1414   CULT  09/22/2020 1414    NO GROWTH 2 DAYS Performed at [facility], [place] [address], [place], [place] [address]    REPTSTATUS PENDING 09/22/2020 1414     Physical Exam General: lying in bed no distress Heart: normal rate Lungs: bilateral chest rise, no iwob Abdomen: Soft non-tender  Extremities: No lower extremity edema Dialysis Access: R IJ TDC dsg c,d,i; R AVF +bruit   Medications:  . aspirin EC  325 mg Oral Daily  . atorvastatin  40 mg Oral Daily  . calcium acetate (Phos Binder)  2,001 mg Oral TID WC   . Chlorhexidine Gluconate Cloth  6 each Topical BID  . clopidogrel  75 mg Oral Daily  . metoprolol tartrate  25 mg Oral BID  . multivitamin  1 tablet Oral QHS  . pantoprazole  40 mg Oral Daily  . PARoxetine  40 mg Oral Daily  . predniSONE  10 mg Oral Q breakfast  . traZODone  100 mg Oral QHS    Dialysis Orders:  [facility] TTS 3h 400/600 2K/2.5Ca EDW 58kg TDC Hep bolus 1800 EPO 6600 q HD Venofer 50 q Mon  Assessment/Plan: 1. CVA. MRI showing subcentimeter acute infarct involving the midline inferior vermis. Chronic left parietal infarct -per neuro/primary team. On antiplatelet therapy.  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  Cardiology reviewed further and thinks this is simply calcification.  Undergoing implanted loop recorder to rule out atrial fibrillation. Cardiolipin IgM is elevated. Defer to other providers regarding consideration of hypercoagulable state requiring anticoagulation 2. ESRD -  HD TTS via TDC. tolerated  dialysis yesterday with no issues.  Continue TTS schedule.  Has an outpatient fistulogram that has been rescheduled 3. Hypertension/volume  - previously some low blood pressures and diltiazem was stopped..  Continue metoprolol but would hold if systolic blood pressures less than 110. 4. Anemia  - Hgb acceptable but dropped from  12.7 to 9.8. No obvious bleeding. CTM. No ESA needs currently  5. Metabolic bone disease -  Ca ok. Continue home binders (phoslyra). No VDRA.  6.  SLE - anti-dsdna norma, Ro and [place] high.  Anticardiolipin IgM high 7. Mitral stenosis: Conservative management at this time.  Cardiology following

## 2020-09-26 ENCOUNTER — Inpatient Hospital Stay (HOSPITAL_COMMUNITY): Payer: Medicare Other

## 2020-09-26 ENCOUNTER — Inpatient Hospital Stay (HOSPITAL_COMMUNITY): Payer: Medicare Other | Admitting: Occupational Therapy

## 2020-09-26 MED ORDER — DARBEPOETIN ALFA 60 MCG/0.3ML IJ SOSY
60.0000 ug | PREFILLED_SYRINGE | INTRAMUSCULAR | Status: DC
  Filled 2020-09-26: qty 0.3

## 2020-09-26 MED ORDER — CHLORHEXIDINE GLUCONATE CLOTH 2 % EX PADS
6.0000 | MEDICATED_PAD | Freq: Every day | CUTANEOUS | Status: DC
  Administered 2020-09-26 – 2020-09-30 (×2): 6 via TOPICAL

## 2020-09-26 NOTE — Progress Notes (Signed)
Physical Therapy Session Note  Patient Details  Name: [name] MRN: [id number] Date of Birth: 1976/04/13  Today's Date: 09/26/2020 PT Individual Time: 1135-1205 PT Individual Time Calculation (min): 30 min   Short Term Goals: Week 1:  PT Short Term Goal 1 (Week 1): = LTGs  Skilled Therapeutic Interventions/Progress Updates:     Patient in bed upon PT arrival. Patient alert and agreeable to PT session. Patient denied pain during session.  Therapeutic Activity: Bed Mobility: Patient performed supine to/from sit independently in a flat bed.  Transfers: Patient performed sit to/from stand from various surfaces with increased upper extremity use with close supervision for safety. Provided verbal cues for scooting forward and forward weight shift prior to standing for improved balance and decreased effort.  Gait Training:  Patient ambulated >200 feet x2 without an AD with close supervision for safety. Ambulated with decreased gait speed, decreased step length, decreased trunk rotation and arm swing, and decreased B weight shift. Provided verbal cues for increased arm swing for improved balance and gait speed, increased forward propulsion, and increased glut med activation in stance for increased stability. Deficits improved following cues and NMR, see below. Patient ascended/descended 12 steps for increased activity tolerance and lower extremity strengthening using B rails x4 steps and 1 rail using B hands for support with CGA. Performed step-to gait pattern performing side stepping technique with 1 rail for increased lateral hip strengthening with a functional activity. Provided cues for technique and sequencing.   Neuromuscular Re-ed: Patient performed the following dynamic gait and balance activities for increased balance and motor control with functional mobility: -ambulating forwards and backwards 20 ft x4 in front of a mirror focused on increased arm swing, symmetrical gait, and  increased gait speed -side stepping in mini-squat with green band around thighs for hip abductor strengthening R/L 2x20 ft -sit to stand without upper extremity support x10 progressing from min A to supervision, provided cues and facilitation for forward weight shift x4, focused on forward weight shift, and equal motor control throughout movement with a mirror for visual feedback  Patient in bed at end of session with breaks locked, bed alarm set, and all needs within reach.    Therapy Documentation Precautions:  Precautions Precautions: Fall Precaution Comments: orthostatic BP Restrictions Weight Bearing Restrictions: No   Therapy/Group: Individual Therapy  Geovanie Winnett L Uziah Sorter PT, DPT  09/26/2020, 4:42 PM

## 2020-09-26 NOTE — Progress Notes (Signed)
°   09/26/20 1245  Clinical Encounter Type  Visited With Patient  Visit Type Initial  Referral From Nurse  Consult/Referral To Chaplain  Spiritual Encounters  Spiritual Needs Literature (AD)  Chaplain responded to the Consult  for Ms. Kopko  AD was left with the Pt to review and informed her if she need further assistance please have the nurse to page the chaplain.  Chaplain Ravinder Hofland [place] [phone]

## 2020-09-26 NOTE — Progress Notes (Signed)
Occupational Therapy Session Note  Patient Details  Name: [name] MRN: [id number] Date of Birth: February 28, 1976  Today's Date: 09/26/2020 OT Individual Time: 3735-7897 OT Individual Time Calculation (min): 73 min    Short Term Goals: Week 1:  OT Short Term Goal 1 (Week 1): STG=LTG 2/2 ELOS  Skilled Therapeutic Interventions/Progress Updates:    Pt greeted at time of session reclined in bed restng agreeable to OT session, mild c/o LUE discomfort at IV site, MD aware and did not limit during session. Supine to sit Supervision, ambulated to sink level with RW CGA and performed UB/LB bathing at sink level, pt aware she is not cleared to shower at this time. UB bathing set up, LB bathing CGA/close supervision including at sit to stand level for buttocks and periarea. Dried off in same manner, UB dress set up, LB dress supervision/CGA including with instructions for hemidressing techniques. Note pt to be ambulating around room sink <> wheelchair short distances without AD with CGA. Brought to apartment via wheelchair for time management and reviewed IADL/kitchen management techniques with safety and stabilization techniques for item retrieval including transfers to various surfaces such as bed and low couch all with close supervsision/CGA with RW. Once back in room, set up in recliner with alarm on call bell in reach.    Therapy Documentation Precautions:  Precautions Precautions: Fall Precaution Comments: orthostatic BP Restrictions Weight Bearing Restrictions: No    Therapy/Group: Individual Therapy  [name] 09/26/2020, 8:45 AM

## 2020-09-26 NOTE — Progress Notes (Signed)
[name] NEPHROLOGY PROGRESS NOTE  Assessment/ Plan: Pt is a 44 y.o. yo female ESRD on HD with a stroke now in patient rehab. Dialysis Orders:  [facility] TTS 3h 400/600 2K/2.5Ca EDW 58kg TDC Hep bolus 1800 EPO 6600 q HD Venofer 50 q Mon  #Stroke: MRI with acute stroke and chronic left parietal infarction.  On antiplatelet treatment and undergoing inpatient rehab.  Further management per primary team and neurologist.  # ESRD TTS via [facility], plan for next dialysis tomorrow.  Needs outpatient fistulogram.  # Anemia due to ESRD: Monitor hemoglobin, drop in hemoglobin noted, start aranesp.  Will monitor lab.   # Secondary hyperparathyroidism: Continue binders.  Monitor calcium phosphorus.  # HTN/volume: On metoprolol.  Volume status acceptable.  Subjective: Undergoing therapy in CIR.  Doing well.  Denies nausea vomiting chest pain shortness of breath.  No new event. Objective Vital signs in last 24 hours: Vitals:   09/25/20 0302 09/25/20 1332 09/25/20 1958 09/26/20 0520  BP: (!) 129/97 124/88 117/73 (!) 131/91  Pulse: 76 79 78 78  Resp: 18 15 17 18   Temp: 98.9 F (37.2 C) 97.8 F (36.6 C) 97.8 F (36.6 C) 97.9 F (36.6 C)  TempSrc: Oral     SpO2: 100% 100% 100% 99%  Weight:      Height:       Weight change:   Intake/Output Summary (Last 24 hours) at 09/26/2020 1032 Last data filed at 09/26/2020 0900 Gross per 24 hour  Intake 740 ml  Output --  Net 740 ml       Labs: Basic Metabolic Panel: Recent Labs  Lab 09/22/20 0957 09/23/20 0154 09/24/20 1437  NA 136 136 139  K 5.7* 5.5* 3.5  CL 98 100 101  CO2 21* 20* 26  GLUCOSE 103* 155* 168*  BUN 65* 77* 42*  CREATININE 7.64* 8.83* 5.93*  CALCIUM 8.2* 8.0* 8.0*  PHOS 7.9* 7.9* 4.4   Liver Function Tests: Recent Labs  Lab 09/20/20 0511 09/21/20 1810 09/22/20 0957 09/23/20 0154 09/24/20 1437  AST 16  --   --   --   --   ALT 40  --   --   --   --   ALKPHOS 100  --   --   --   --   BILITOT  0.5  --   --   --   --   PROT 5.3*  --   --   --   --   ALBUMIN 2.7*   < > 3.0* 2.8* 2.6*   < > = values in this interval not displayed.   No results for input(s): LIPASE, AMYLASE in the last 168 hours. No results for input(s): AMMONIA in the last 168 hours. CBC: Recent Labs  Lab 09/20/20 0511 09/20/20 0511 09/21/20 1810 09/21/20 1810 09/22/20 1230 09/23/20 0741 09/24/20 1436  WBC 10.3   < > 11.9*   < > 12.4* 11.9* 9.0  NEUTROABS  --   --   --   --  8.8*  --   --   HGB 10.3*   < > 11.8*   < > 12.3 12.7 9.8*  HCT 32.7*   < > 38.0   < > 39.2 34.4* 31.3*  MCV 90.1  --  91.1  --  90.7 91.2 93.2  PLT 75*   < > 106*   < > 99* 95* 99*   < > = values in this interval not displayed.   Cardiac Enzymes: No results  for input(s): CKTOTAL, CKMB, CKMBINDEX, TROPONINI in the last 168 hours. CBG: No results for input(s): GLUCAP in the last 168 hours.  Iron Studies: No results for input(s): IRON, TIBC, TRANSFERRIN, FERRITIN in the last 72 hours. Studies/Results: No results found.  Medications: Infusions:   Scheduled Medications:  aspirin EC  325 mg Oral Daily   atorvastatin  40 mg Oral Daily   calcium acetate (Phos Binder)  2,001 mg Oral TID WC   Chlorhexidine Gluconate Cloth  6 each Topical BID   clopidogrel  75 mg Oral Daily   metoprolol tartrate  25 mg Oral BID   multivitamin  1 tablet Oral QHS   pantoprazole  40 mg Oral Daily   PARoxetine  40 mg Oral Daily   predniSONE  10 mg Oral Q breakfast   traZODone  100 mg Oral QHS    have reviewed scheduled and prn medications.  Physical Exam: General:NAD, comfortable Heart:RRR, s1s2 nl Lungs:clear b/l, no crackle Abdomen:soft, Non-tender, non-distended Extremities:No edema Dialysis Access: Right IJ TDC  Coleby Yett Prasad Lezly Rumpf 09/26/2020,10:32 AM  LOS: 3 days  Pager: [phone]

## 2020-09-26 NOTE — Progress Notes (Signed)
Physical Therapy Session Note  Patient Details  Name: [name] MRN: [id number] Date of Birth: 1975-12-20  Today's Date: 09/26/2020 PT Individual Time: 1000-1100 and 1415-1500 PT Individual Time Calculation (min): 60 min and 45 min  Short Term Goals: Week 1:  PT Short Term Goal 1 (Week 1): = LTGs  Skilled Therapeutic Interventions/Progress Updates:    AM SESSION PAIN Denies pain Pt initially supine and eager to participate. Supine to sit w/supervision donns shoes independently. STS w/cga. Gait without AD x 128ft w/cga, mildly unsteady, fatigued and required seated rest x 4-5 min  Pt educated re: use of maxiski for balance training. Pt stood several min w/supervision while therapist donned harness/set up [place].  Pt then perfomed functional gait training at increasing speeds, sidestepping, backing all 52ft x 4.  Seated Rest breaks between efforts.  Decreased stability at increased speeds and w/backing.  Discussed functional application of backing/sidestepping.  Balance training  Stepping over varying height surfaces from 2in to 5 in, stepping onto and over steps from 2in to 5 in, decreased stability w/activites requiring increased single limb stance time.  Gait 160ft no AD, CGA, mildly unsteady, turn/sit to bed w/cga.  Sit to supine w/supervision. Pt left supine w/rails up x 3, alarm set, bed in lowest position, and needs in reach.    PM SESSION PAIN denies pain Pt initially states she is fatigued, requested 30 min additional rest prior to session.  Agreed to return later in day for session. Returned at 1415 and pt agreeable. Supine to sit, dons shoes w/supervision.   Gait 55ft to commode, commode transfer w/cga. Pt continent and independent w/hygiene. Gait 15 ft to sink w/cga to heabvy min assist w/balance loss at door transition/min assist for recovery. Stood at sink w/close supervision and washes hands /no assist for task.  Gait 150 x2 w/min to cga, mild  unsteadiness, to/from gym for session.  Functional gait: Weaving around cones placed at 4-6 ft intervals w/full turns on 2 of 8 cones and cga Repeated carrying 4.4lb ball for increased functional challenge.  Stairs: Ascends step over step, descends step 2 x 8 steps w/2 rails and close supervision.  Balance: Standing on foam - mild increased sway, cga        Cervical nods and rotation, initially increased sway, decreases w/repitition, cga        alteranating arm raises cga         repeated Forward reach - cga to lt min assist  Standing ball toss into rebounder performed at 2.28ft and 4 ft from rebounder for balance and endurance challenge, 10 reps each.   Pt left seated in bed w/rails up x 3, alarm set, bed in lowest position, and needs in reach.   Therapy Documentation Precautions:  Precautions Precautions: Fall Precaution Comments: orthostatic BP Restrictions Weight Bearing Restrictions: No    Therapy/Group: Individual Therapy  Callie Fielding, [place] 09/26/2020, 12:24 PM

## 2020-09-26 NOTE — IPOC Note (Signed)
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     Functional Problem List: Nursing Medication Management, Safety, Nutrition, Skin Integrity, Edema  PT Balance, Edema, Endurance, Motor, Sensory  OT Balance, Edema, Endurance, Motor, Pain  SLP    TR         Basic ADL's: OT Grooming, Bathing, Dressing, Toileting     Advanced  ADL's: OT Simple Meal Preparation, Laundry, Light Housekeeping     Transfers: PT Bed Mobility, Bed to Chair, Car, Furniture, Floor  OT Toilet, Tub/Shower     Locomotion: PT Ambulation, Stairs     Additional Impairments: OT None  SLP        TR      Anticipated Outcomes Item Anticipated Outcome  Self Feeding Independent  Swallowing      Basic self-care  Mod I  Toileting  Mod I   Bathroom Transfers Mod I  Bowel/Bladder  pt will be continent of bowel, pt anuric  Transfers  mod I basic transfers  Locomotion  mod I household mobility; supervision stairs  Communication     Cognition     Pain  pain<3  Safety/Judgment  pt will be free of fall or injury   Therapy Plan: PT Intensity: Minimum of 1-2 x/day ,45 to 90 minutes PT Frequency: 5 out of 7 days PT Duration Estimated Length of Stay: 5-7 days OT Intensity: Minimum of 1-2 x/day, 45 to 90 minutes OT Frequency: 5 out of 7 days OT Duration/Estimated Length of Stay: 5-7 days     Due to the current state of emergency, patients may not be receiving their 3-hours of Medicare-mandated therapy.   Team Interventions: Nursing Interventions Patient/Family Education, Disease Management/Prevention, Medication Management, Skin Care/Wound Management, Discharge Planning  PT interventions Ambulation/gait training, Balance/vestibular training, Community  reintegration, Discharge planning, Disease management/prevention, DME/adaptive equipment instruction, Functional mobility training, Neuromuscular re-education, Pain management, Patient/family education, Psychosocial support, Skin care/wound management, Splinting/orthotics, Stair training, Therapeutic Activities, Therapeutic Exercise, UE/LE Strength taining/ROM, UE/LE Coordination activities, Wheelchair propulsion/positioning  OT Interventions Training and development officer, Academic librarian, Discharge planning, Disease mangement/prevention, Engineer, drilling, Functional mobility training, Pain management, Patient/family education, Self Care/advanced ADL retraining, Therapeutic Activities, Therapeutic Exercise, UE/LE Strength taining/ROM, UE/LE Coordination activities  SLP Interventions    TR Interventions    SW/CM Interventions Discharge Planning, Psychosocial Support, Patient/Family Education   Barriers to Discharge MD  Medical stability, Home enviroment access/loayout, Lack of/limited family support, Weight and Hemodialysis  Nursing Other (comments) (no insurance )    PT      OT Decreased caregiver support    SLP      SW       Team Discharge Planning: Destination: PT-Home ,OT- Home , SLP-  Projected Follow-up: PT-Home health PT, OT-  Home health OT, 24 hour supervision/assistance, SLP-  Projected Equipment Needs: PT-To be determined, OT- To be determined, SLP-  Equipment Details: PT-has SPC at home, OT-  Patient/family involved in discharge planning: PT- Patient,  OT-Patient, SLP-   MD ELOS: 5-7 days  Medical Rehab Prognosis:  Good Assessment: Pt is a 44 yr old female with lupus on HD and HTN s/p vermis cerebellar stroke- - Goals Mod I by d/c.     See Team Conference Notes for weekly updates to the plan of care

## 2020-09-26 NOTE — Progress Notes (Signed)
[facility] PHYSICAL MEDICINE & REHABILITATION PROGRESS NOTE   Subjective/Complaints:  Pt reports LUE is sore- has IV and was getting BP used on it- Has HD catheter so can't shower right now.   Will get IV removed since not using right now.   ROS:  Pt denies SOB, abd pain, CP, N/V/C/D, and vision changes  Objective:   No results found. Recent Labs    09/24/20 1436  WBC 9.0  HGB 9.8*  HCT 31.3*  PLT 99*   Recent Labs    09/24/20 1437  NA 139  K 3.5  CL 101  CO2 26  GLUCOSE 168*  BUN 42*  CREATININE 5.93*  CALCIUM 8.0*    Intake/Output Summary (Last 24 hours) at 09/26/2020 1635 Last data filed at 09/26/2020 0900 Gross per 24 hour  Intake 500 ml  Output --  Net 500 ml        Physical Exam: Vital Signs Blood pressure (!) 129/91, pulse 75, temperature 97.9 F (36.6 C), resp. rate 14, height 5\' 4"  (1.626 m), weight 58.8 kg, SpO2 100 %.  General: sitting up in bed- OT in room, appropriate, NAD HEENT: poor dentition Neck: Supple without JVD or lymphadenopathy Heart: RRR Chest: CTA B/L- no W/R/R- good air movement Abdomen: Soft, NT, ND, (+)BS   Extremities: Nonpitting edema, left>right- IV in L upper arm- sore over BP cuff area where IV is- no hematoma palpated Skin: Clean and intact without signs of breakdown; HDD catheter in R chest- looks OK- L upper arm IV looks OK Neuro: Patient is alert.  No acute distress and cooperative with exam.  Makes eye contact with examiner.  Oriented x3 and follows commands. 4+/5 strength throughout Psych: Pt's affect is appropriate. Pt is cooperative    Assessment/Plan: 1. Functional deficits secondary to CVA which require 3+ hours per day of interdisciplinary therapy in a comprehensive inpatient rehab setting.  Physiatrist is providing close team supervision and 24 hour management of active medical problems listed below.  Physiatrist and rehab team continue to assess barriers to discharge/monitor patient progress toward  functional and medical goals  Care Tool:  Bathing    Body parts bathed by patient: Right arm, Left arm, Chest, Abdomen, Front perineal area, Buttocks, Right upper leg, Left upper leg, Face, Right lower leg, Left lower leg   Body parts bathed by helper: Right lower leg, Left lower leg     Bathing assist Assist Level: Contact Guard/Touching assist     Upper Body Dressing/Undressing Upper body dressing   What is the patient wearing?: Pull over shirt    Upper body assist Assist Level: Set up assist    Lower Body Dressing/Undressing Lower body dressing      What is the patient wearing?: Pants, Underwear/pull up     Lower body assist Assist for lower body dressing: Contact Guard/Touching assist     Toileting Toileting    Toileting assist Assist for toileting: Independent with assistive device     Transfers Chair/bed transfer  Transfers assist     Chair/bed transfer assist level: Contact Guard/Touching assist     Locomotion Ambulation   Ambulation assist      Assist level: Contact Guard/Touching assist Assistive device: No Device Max distance: 150'   Walk 10 feet activity   Assist     Assist level: Contact Guard/Touching assist Assistive device: No Device   Walk 50 feet activity   Assist    Assist level: Contact Guard/Touching assist Assistive device: No Device  Walk 150 feet activity   Assist    Assist level: Contact Guard/Touching assist Assistive device: No Device    Walk 10 feet on uneven surface  activity   Assist     Assist level: Minimal Assistance - Patient > 75%     Wheelchair     Assist Will patient use wheelchair at discharge?: No             Wheelchair 50 feet with 2 turns activity    Assist            Wheelchair 150 feet activity     Assist          Blood pressure (!) 129/91, pulse 75, temperature 97.9 F (36.6 C), resp. rate 14, height 5\' 4"  (1.626 m), weight 58.8 kg, SpO2 100  %.    Medical Problem List and Plan: 1.  Vertigo, dizziness and unstable gait secondary to acute cerebellar vermis small infarct.  Status post loop recorder 11/4             -patient may shower             -ELOS/Goals: modI 10-12 days             -Continue CIR 2.  Antithrombotics: -DVT/anticoagulation: SCDs             -antiplatelet therapy: Aspirin 325 mg daily 3. Pain Management: Tylenol as needed. Well controlled  11/8- take for LUE pain- prn-con't 4. Mood: Paxil 40 mg daily, trazodone 100 mg nightly. In positive mood.             -antipsychotic agents: N/A 5. Neuropsych: This patient is capable of making decisions on her own behalf. 6. Skin/Wound Care: Routine skin checks  11/8- remove LUE IV so BP cuff doesn't push on IV 7. Fluids/Electrolytes/Nutrition: Routine in and outs with follow-up chemistries 8.  End-stage renal disease.  Continue hemodialysis per renal services.  Outpatient fistulogram to be scheduled. T/Th/Sa schedule. 9.  Lupus.  Chronic prednisone 10 mg daily.  Follow-up outpatient rheumatology services 10.  Mitral stenosis.  Follow-up cardiology services. Loop recorder placed 11/4. 11.  Hypertension.  Lopressor 25 mg twice daily, Cardizem 120 mg nightly.  Monitor with increased mobility. Has had low Bps w/ lightheadedness 11/4 and medications were decreased as a result.  11/7: elevated diastolic- continue to monitor   11/8- DBP slightly elevated, but otherwise SBP controlled- con't regimen for now 12.  Hyperlipidemia.  Lipitor 40mg  daily.  LOS: 3 days A FACE TO FACE EVALUATION WAS PERFORMED  [name] 09/26/2020, 4:35 PM

## 2020-09-26 NOTE — Progress Notes (Signed)
Inpatient Rehabilitation  Patient information reviewed and entered into eRehab system by Shatika Grinnell M. Kirah Stice, M.A., CCC/SLP, PPS Coordinator.  Information including medical coding, functional ability and quality indicators will be reviewed and updated through discharge.    

## 2020-09-26 NOTE — Progress Notes (Signed)
Occupational Therapy Session Note  Patient Details  Name: [name] MRN: [id number] Date of Birth: 1976-06-03  Today's Date: 09/26/2020 OT Individual Time: 9030-1499 OT Individual Time Calculation (min): 51 min    Short Term Goals: Week 1:  OT Short Term Goal 1 (Week 1): STG=LTG 2/2 ELOS  Skilled Therapeutic Interventions/Progress Updates:    Pt greeted at time of session semireclined in bed on phone with family member, agreeable to an additional OT session despite it being not scheduled. Pt agreeable to additional time. Supine to sit Supervision, ambulated short distance to wheelchair CGA no AD. Brought to gym via wheelchair for time management and stand pivot to mat CGA/supervision. Dynamic seated progressing to standing rebounder activity with 1kg red ball 4x20 primarily in standing with graded challenge adding blue balance foam. Dynamic dynavision in standing no AD for 2 minutes with equal reaction speed L/R to challenge balance and tolerance. 1x15 forward reaches with large therapy ball for back stretch, long sitting on mat for 2x10 hamstring stretches d/t pt report of tightness. Focus session on bimanual integration and NMR for balance training in standing. Transported back to room via wheelchair for time management, ambulated form doorway to bed CGA/supervision no AD. Alarm on, call bell in reach.   Therapy Documentation Precautions:  Precautions Precautions: Fall Precaution Comments: orthostatic BP Restrictions Weight Bearing Restrictions: No     Therapy/Group: Individual Therapy  [name] 09/26/2020, 4:35 PM

## 2020-09-26 NOTE — Progress Notes (Signed)
Inpatient Rehabilitation Center Individual Statement of Services  Patient Name:  [name]  Date:  09/26/2020  Welcome to the [facility].  Our goal is to provide you with an individualized program based on your diagnosis and situation, designed to meet your specific needs.  With this comprehensive rehabilitation program, you will be expected to participate in at least 3 hours of rehabilitation therapies Monday-Friday, with modified therapy programming on the weekends.  Your rehabilitation program will include the following services:  Physical Therapy (PT), Occupational Therapy (OT), Speech Therapy (ST), 24 hour per day rehabilitation nursing, Therapeutic Recreaction (TR), Neuropsychology, Care Coordinator, Rehabilitation Medicine, Nutrition Services, Pharmacy Services and Other  Weekly team conferences will be held on Tuesday to discuss your progress.  Your Inpatient Rehabilitation Care Coordinator will talk with you frequently to get your input and to update you on team discussions.  Team conferences with you and your family in attendance may also be held.  Expected length of stay: 7-10 Days  Overall anticipated outcome: Intermittent Supervision   Depending on your progress and recovery, your program may change. Your Inpatient Rehabilitation Care Coordinator will coordinate services and will keep you informed of any changes. Your Inpatient Rehabilitation Care Coordinator's name and contact numbers are listed  below.  The following services may also be recommended but are not provided by the [facility]:    [place] will be made to provide these services after discharge if needed.  Arrangements include referral to agencies that provide these services.  Your insurance has been verified to be:  [place] [employer] Your primary doctor is:  Catalina Antigua, MD  Pertinent  information will be shared with your doctor and your insurance company.  Inpatient Rehabilitation Care Coordinator:  Erlene Quan, [place] or [phone]  Information discussed with and copy given to patient by: Dyanne Iha, 09/26/2020, 11:49 AM

## 2020-09-27 ENCOUNTER — Inpatient Hospital Stay (HOSPITAL_COMMUNITY): Payer: Medicare Other

## 2020-09-27 ENCOUNTER — Inpatient Hospital Stay (HOSPITAL_COMMUNITY): Payer: Medicare Other | Admitting: Physical Therapy

## 2020-09-27 ENCOUNTER — Inpatient Hospital Stay (HOSPITAL_COMMUNITY): Payer: Medicare Other | Admitting: Occupational Therapy

## 2020-09-27 LAB — RENAL FUNCTION PANEL
Albumin: 2.8 g/dL — ABNORMAL LOW (ref 3.5–5.0)
Anion gap: 16 — ABNORMAL HIGH (ref 5–15)
BUN: 70 mg/dL — ABNORMAL HIGH (ref 6–20)
CO2: 22 mmol/L (ref 22–32)
Calcium: 8.6 mg/dL — ABNORMAL LOW (ref 8.9–10.3)
Chloride: 97 mmol/L — ABNORMAL LOW (ref 98–111)
Creatinine, Ser: 9.31 mg/dL — ABNORMAL HIGH (ref 0.44–1.00)
GFR, Estimated: 5 mL/min — ABNORMAL LOW (ref >60)
Glucose, Bld: 297 mg/dL — ABNORMAL HIGH (ref 70–99)
Phosphorus: 5.5 mg/dL — ABNORMAL HIGH (ref 2.5–4.6)
Potassium: 5.2 mmol/L — ABNORMAL HIGH (ref 3.5–5.1)
Sodium: 135 mmol/L (ref 135–145)

## 2020-09-27 LAB — CULTURE, BLOOD (ROUTINE X 2)
Culture: NO GROWTH
Culture: NO GROWTH
Special Requests: ADEQUATE

## 2020-09-27 LAB — CBC
HCT: 34.6 % — ABNORMAL LOW (ref 36.0–46.0)
Hemoglobin: 10.5 g/dL — ABNORMAL LOW (ref 12.0–15.0)
MCH: 28.6 pg (ref 26.0–34.0)
MCHC: 30.3 g/dL (ref 30.0–36.0)
MCV: 94.3 fL (ref 80.0–100.0)
Platelets: 158 10*3/uL (ref 150–400)
RBC: 3.67 MIL/uL — ABNORMAL LOW (ref 3.87–5.11)
RDW: 18.6 % — ABNORMAL HIGH (ref 11.5–15.5)
WBC: 11.4 10*3/uL — ABNORMAL HIGH (ref 4.0–10.5)
nRBC: 0 % (ref 0.0–0.2)

## 2020-09-27 MED ORDER — ALTEPLASE 2 MG IJ SOLR: 2.0000 mg | Freq: Once | INTRAMUSCULAR | Status: DC | PRN

## 2020-09-27 MED ORDER — PENTAFLUOROPROP-TETRAFLUOROETH EX AERO: 1.0000 "application " | INHALATION_SPRAY | CUTANEOUS | Status: DC | PRN

## 2020-09-27 MED ORDER — DARBEPOETIN ALFA 60 MCG/0.3ML IJ SOSY
PREFILLED_SYRINGE | INTRAMUSCULAR | Status: AC
  Administered 2020-09-27: 60 ug via INTRAVENOUS
  Filled 2020-09-27: qty 0.3

## 2020-09-27 MED ORDER — SODIUM CHLORIDE 0.9 % IV SOLN: 100.0000 mL | INTRAVENOUS | Status: DC | PRN

## 2020-09-27 MED ORDER — HEPARIN SODIUM (PORCINE) 1000 UNIT/ML DIALYSIS: 1000.0000 [IU] | INTRAMUSCULAR | Status: DC | PRN

## 2020-09-27 MED ORDER — LIDOCAINE-PRILOCAINE 2.5-2.5 % EX CREA: 1.0000 "application " | TOPICAL_CREAM | CUTANEOUS | Status: DC | PRN

## 2020-09-27 MED ORDER — LIDOCAINE HCL (PF) 1 % IJ SOLN: 5.0000 mL | INTRAMUSCULAR | Status: DC | PRN

## 2020-09-27 MED ORDER — HEPARIN SODIUM (PORCINE) 1000 UNIT/ML IJ SOLN
INTRAMUSCULAR | Status: AC
  Administered 2020-09-27: 1000 [IU] via INTRAVENOUS_CENTRAL
  Filled 2020-09-27: qty 4

## 2020-09-27 MED ORDER — HEPARIN SODIUM (PORCINE) 1000 UNIT/ML DIALYSIS: 20.0000 [IU]/kg | INTRAMUSCULAR | Status: DC | PRN

## 2020-09-27 NOTE — Progress Notes (Signed)
Physical Therapy Session Note  Patient Details  Name: [name] MRN: [id number] Date of Birth: 09-12-1976  Today's Date: 09/27/2020 PT Individual Time: 1100-1200 PT Individual Time Calculation (min): 60 min   Short Term Goals: Week 1:  PT Short Term Goal 1 (Week 1): = LTGs  Skilled Therapeutic Interventions/Progress Updates:    pt received in bed and agreeable to therapy. Pt denied pain at start and end of session. Pt directed in supine>sit supervision; Sit to stand without AD CGA, directed in gait training without AD for 75' and requested to sit down reporting she felt very fatigued. Pt took seated rest break, then directed in additional 60' to gym CGA with VC for trunk extension, increased stride length. Pt directed in NuStep for 6 mins L3 with emphasis being increased tolerance to activity, BLE Strengthening and increasing mobility in BLE joints as pt reported she felt "tight" when she woke for therapy session. Pt directed in seated BLE strengthening exercises of marching, LAQ, hip abduction and adduction, 2x15 with 2.5#, 3x5 Sit to stand from mat without UE support at [facility]. Pt reported feeling very fatigued and required prolonged rest break. Pt directed in standing reaching activity on level surface without AD or external support, x25 in multiple planes including ground and overhead outside Base of support without LOB, grossly CGA. Pt directed in gait to return to room and requested to return to bed, CGA grossly for gait and supervision for sit>supine. Pt left in bed, alarm set, All needs in reach and in good condition. Call light in hand.    Therapy Documentation Precautions:  Precautions Precautions: Fall Precaution Comments: orthostatic BP Restrictions Weight Bearing Restrictions: No General: Pain: Pain Assessment Pain Scale: 0-10 Pain Score: 0-No pain   Therapy/Group: Individual Therapy  Junie Panning 09/27/2020, 3:27 PM

## 2020-09-27 NOTE — Progress Notes (Signed)
Physical Therapy Session Note  Patient Details  Name: [name] MRN: [id number] Date of Birth: 19-Apr-1976  Today's Date: 09/27/2020 PT Individual Time: 0930-1030 PT Individual Time Calculation (min): 60 min   Short Term Goals: Week 1:  PT Short Term Goal 1 (Week 1): = LTGs  Skilled Therapeutic Interventions/Progress Updates:    Patient in supine, sat up with S and donned shoes at EOB S.  Sit to stand CGA and ambulated to therapy gym with CGA no device x 120'.  C/o dizziness initially, BP measured initially 130/101 seated, 153/106 standing.  Noted SOB after curb practice SpO2 100%, HR 72.  Patient ambulated without device negotiating ramp and curb walking through mulch with CGA and cues for sequencing and to use rail if needed.  Patient standing for R LE forced use with L LE up on 6" step to reach for and toss horseshoes with CGA.  Standing static between tosses while PT picked up horseshoes distant S.  Patient practiced picking up items from floor with S to CGA cups and horse shoes.  Patient performed step ups onto 6" step with HHA x 5 each leg, side stepping over bolsters at counter x 5 trips, then quadruped alternate hip extension with cues for core activation and min A.  Patient ambulated to room with CGA and returned to supine with S.  Left in bed with needs in reach and bed alarm activated.  Therapy Documentation Precautions:  Precautions Precautions: Fall Precaution Comments: orthostatic BP Restrictions Weight Bearing Restrictions: No GePain: Pain Assessment Pain Scale: 0-10 Pain Score: 0-No pain    Therapy/Group: Individual Therapy  Reginia Naas  [place], PT 09/27/2020, 10:02 AM

## 2020-09-27 NOTE — Progress Notes (Signed)
[facility] NEPHROLOGY PROGRESS NOTE  Assessment/ Plan: Pt is a 44 y.o. yo female ESRD on HD with a stroke now in patient rehab. Dialysis Orders:  [facility] TTS 3h 400/600 2K/2.5Ca EDW 58kg TDC Hep bolus 1800 EPO 6600 q HD Venofer 50 q Mon  #Stroke: MRI with acute stroke and chronic left parietal infarction.  On antiplatelet treatment and undergoing inpatient rehab.  Further management per primary team and neurologist.  # ESRD TTS via [facility], plan for dialysis today.  Needs outpatient fistulogram.  # Anemia due to ESRD: Monitor hemoglobin, drop in hemoglobin noted, start aranesp.  Will monitor lab.   # Secondary hyperparathyroidism: Continue binders.  Monitor calcium phosphorus.  # HTN/volume: On metoprolol.  Volume status acceptable.  Subjective: Undergoing therapy in CIR.  Doing well.  Denies nausea vomiting chest pain shortness of breath.  No new event. Objective Vital signs in last 24 hours: Vitals:   09/25/20 1958 09/26/20 0520 09/26/20 1313 09/27/20 0615  BP: 117/73 (!) 131/91 (!) 129/91 (!) 130/91  Pulse: 78 78 75 70  Resp: 17 18 14 17   Temp: 97.8 F (36.6 C) 97.9 F (36.6 C) 97.9 F (36.6 C) 97.6 F (36.4 C)  TempSrc:    Oral  SpO2: 100% 99% 100% 100%  Weight:      Height:       Weight change:   Intake/Output Summary (Last 24 hours) at 09/27/2020 0910 Last data filed at 09/27/2020 0900 Gross per 24 hour  Intake 540 ml  Output --  Net 540 ml       Labs: Basic Metabolic Panel: Recent Labs  Lab 09/22/20 0957 09/23/20 0154 09/24/20 1437  NA 136 136 139  K 5.7* 5.5* 3.5  CL 98 100 101  CO2 21* 20* 26  GLUCOSE 103* 155* 168*  BUN 65* 77* 42*  CREATININE 7.64* 8.83* 5.93*  CALCIUM 8.2* 8.0* 8.0*  PHOS 7.9* 7.9* 4.4   Liver Function Tests: Recent Labs  Lab 09/22/20 0957 09/23/20 0154 09/24/20 1437  ALBUMIN 3.0* 2.8* 2.6*   No results for input(s): LIPASE, AMYLASE in the last 168 hours. No results for input(s): AMMONIA in the  last 168 hours. CBC: Recent Labs  Lab 09/21/20 1810 09/21/20 1810 09/22/20 1230 09/23/20 0741 09/24/20 1436  WBC 11.9*   < > 12.4* 11.9* 9.0  NEUTROABS  --   --  8.8*  --   --   HGB 11.8*   < > 12.3 12.7 9.8*  HCT 38.0   < > 39.2 34.4* 31.3*  MCV 91.1  --  90.7 91.2 93.2  PLT 106*   < > 99* 95* 99*   < > = values in this interval not displayed.   Cardiac Enzymes: No results for input(s): CKTOTAL, CKMB, CKMBINDEX, TROPONINI in the last 168 hours. CBG: No results for input(s): GLUCAP in the last 168 hours.  Iron Studies: No results for input(s): IRON, TIBC, TRANSFERRIN, FERRITIN in the last 72 hours. Studies/Results: No results found.  Medications: Infusions:   Scheduled Medications: . aspirin EC  325 mg Oral Daily  . atorvastatin  40 mg Oral Daily  . calcium acetate (Phos Binder)  2,001 mg Oral TID WC  . Chlorhexidine Gluconate Cloth  6 each Topical BID  . Chlorhexidine Gluconate Cloth  6 each Topical Q0600  . darbepoetin (ARANESP) injection - DIALYSIS  60 mcg Intravenous Q Tue-HD  . metoprolol tartrate  25 mg Oral BID  . multivitamin  1 tablet Oral QHS  .  pantoprazole  40 mg Oral Daily  . PARoxetine  40 mg Oral Daily  . predniSONE  10 mg Oral Q breakfast  . traZODone  100 mg Oral QHS    have reviewed scheduled and prn medications.  Physical Exam: General:NAD, comfortable Heart:RRR, s1s2 nl Lungs:clear b/l, no crackle Abdomen:soft, Non-tender, non-distended Extremities:No edema Dialysis Access: Right IJ TDC  Edson Deridder Prasad Dijon Kohlman 09/27/2020,9:10 AM  LOS: 4 days  Pager: [phone]

## 2020-09-27 NOTE — Progress Notes (Signed)
[place] PHYSICAL MEDICINE & REHABILITATION PROGRESS NOTE   Subjective/Complaints:  Pt reports LUE pain MUCH improved with IV out- so can take BP's better.  LBM this AM Is anuric- doesn't form any urine.   ROS:   Pt denies SOB, abd pain, CP, N/V/C/D, and vision changes   Objective:   No results found. Recent Labs    09/24/20 1436  WBC 9.0  HGB 9.8*  HCT 31.3*  PLT 99*   Recent Labs    09/24/20 1437  NA 139  K 3.5  CL 101  CO2 26  GLUCOSE 168*  BUN 42*  CREATININE 5.93*  CALCIUM 8.0*    Intake/Output Summary (Last 24 hours) at 09/27/2020 0850 Last data filed at 09/26/2020 2133 Gross per 24 hour  Intake 720 ml  Output --  Net 720 ml        Physical Exam: Vital Signs Blood pressure (!) 130/91, pulse 70, temperature 97.6 F (36.4 C), temperature source Oral, resp. rate 17, height 5\' 4"  (1.626 m), weight 58.8 kg, SpO2 100 %.  General: sitting up in manual w/c at sink, OT in room, NAD HEENT: poor dentition Neck: Supple without JVD or lymphadenopathy Heart: RRR Chest: CTA B/L- no W/R/R- good air movement- HD catheter in R chest Abdomen: Soft, NT, ND, (+)BS  Extremities: Nonpitting edema, L upper arm- IV out- less swollen Skin: Clean and intact without signs of breakdown; HDD catheter in R chest- looks OK- L upper arm IV looks OK Neuro: Patient is alert.  No acute distress and cooperative with exam.  Makes eye contact with examiner.  Oriented x3 and follows commands. 4+/5 strength throughout Psych: Pt's affect is appropriate. Pt is cooperative    Assessment/Plan: 1. Functional deficits secondary to CVA which require 3+ hours per day of interdisciplinary therapy in a comprehensive inpatient rehab setting.  Physiatrist is providing close team supervision and 24 hour management of active medical problems listed below.  Physiatrist and rehab team continue to assess barriers to discharge/monitor patient progress toward functional and medical goals  Care  Tool:  Bathing    Body parts bathed by patient: Right arm, Left arm, Chest, Abdomen, Front perineal area, Buttocks, Right upper leg, Left upper leg, Face, Right lower leg, Left lower leg   Body parts bathed by helper: Right lower leg, Left lower leg     Bathing assist Assist Level: Contact Guard/Touching assist     Upper Body Dressing/Undressing Upper body dressing   What is the patient wearing?: Pull over shirt    Upper body assist Assist Level: Set up assist    Lower Body Dressing/Undressing Lower body dressing      What is the patient wearing?: Pants, Underwear/pull up     Lower body assist Assist for lower body dressing: Contact Guard/Touching assist     Toileting Toileting    Toileting assist Assist for toileting: Independent with assistive device     Transfers Chair/bed transfer  Transfers assist     Chair/bed transfer assist level: Supervision/Verbal cueing     Locomotion Ambulation   Ambulation assist      Assist level: Supervision/Verbal cueing Assistive device: No Device Max distance: >200 ft   Walk 10 feet activity   Assist     Assist level: Supervision/Verbal cueing Assistive device: No Device   Walk 50 feet activity   Assist    Assist level: Supervision/Verbal cueing Assistive device: No Device    Walk 150 feet activity   Assist    Assist  level: Supervision/Verbal cueing Assistive device: No Device    Walk 10 feet on uneven surface  activity   Assist     Assist level: Minimal Assistance - Patient > 75%     Wheelchair     Assist Will patient use wheelchair at discharge?: No             Wheelchair 50 feet with 2 turns activity    Assist            Wheelchair 150 feet activity     Assist          Blood pressure (!) 130/91, pulse 70, temperature 97.6 F (36.4 C), temperature source Oral, resp. rate 17, height 5\' 4"  (1.626 m), weight 58.8 kg, SpO2 100 %.    Medical Problem List  and Plan: 1.  Vertigo, dizziness and unstable gait secondary to acute cerebellar vermis small infarct.  Status post loop recorder 11/4             -patient may shower             -ELOS/Goals: modI 10-12 days             -Continue CIR 2.  Antithrombotics: -DVT/anticoagulation: SCDs             -antiplatelet therapy: Aspirin 325 mg daily 3. Pain Management: Tylenol as needed. Well controlled  11/8- take for LUE pain- prn-con't  11/9- pain better- con't meds prn 4. Mood: Paxil 40 mg daily, trazodone 100 mg nightly. In positive mood.             -antipsychotic agents: N/A 5. Neuropsych: This patient is capable of making decisions on her own behalf. 6. Skin/Wound Care: Routine skin checks  11/8- remove LUE IV so BP cuff doesn't push on IV 7. Fluids/Electrolytes/Nutrition: Routine in and outs with follow-up chemistries 8.  End-stage renal disease.  Continue hemodialysis per renal services.  Outpatient fistulogram to be scheduled. T/Th/Sa schedule. 9.  Lupus.  Chronic prednisone 10 mg daily.  Follow-up outpatient rheumatology services 10.  Mitral stenosis.  Follow-up cardiology services. Loop recorder placed 11/4. 11.  Hypertension.  Lopressor 25 mg twice daily, Cardizem 120 mg nightly.  Monitor with increased mobility. Has had low Bps w/ lightheadedness 11/4 and medications were decreased as a result.  11/7: elevated diastolic- continue to monitor   11/8- DBP slightly elevated, but otherwise SBP controlled- con't regimen for now  11/9- DBP still elevated slightly 130/91- won't change meds as of yet.  12.  Hyperlipidemia.  Lipitor 40mg  daily.  LOS: 4 days A FACE TO FACE EVALUATION WAS PERFORMED  [name] 09/27/2020, 8:50 AM

## 2020-09-27 NOTE — Progress Notes (Signed)
Occupational Therapy Session Note  Patient Details  Name: [name] MRN: [id number] Date of Birth: [birth year]  Today's Date: 09/27/2020 OT Individual Time: 6294-7654 OT Individual Time Calculation (min): 58 min    Short Term Goals: Week 1:  OT Short Term Goal 1 (Week 1): STG=LTG 2/2 ELOS  Skilled Therapeutic Interventions/Progress Updates:    pt greeted at time of session reclined in bed sleeping but easily woken and agreeable to OT session, no c/o pain. Ambulated short distance to sink level with close supervision no AD and doffed clothing with supervision in sitting and standing. UB/LB bathing at sink level as not cleared to shower with set up for UB and Supervision for LB at standing level at sink, no LOB. Pt able to stand for bathing tasks for approx 10 minutes without fatigue or complaint. UB/LB dressing with set up for UB and Supervision for LB including underwear and pants. Oral hygiene supervisoin only for standing balance. Ambulated thorughout room to bathroom no AD, CGA for toilet transfer for safety only no physical assist, 3/3 toileting tasks supervision. Pt ambulated throughout hall with no AD approx 50 feet to nurses station with close wheelchair follow. Wheelchair transport remaining distance to shower room and performed shower transfer with shower seat holding grab bar (pt has in her shower already) and side stepping over tub wall with CGA/close supervision for safety only as this was a new tasks. Pt agreeable to shower seat and wants to purchase on her own. Wheelchair transport back to room for time management and transferred to bed Supervision, alarm on and call bell in reach.   Therapy Documentation Precautions:  Precautions Precautions: Fall Precaution Comments: orthostatic BP Restrictions Weight Bearing Restrictions: No    Therapy/Group: Individual Therapy  [name] 09/27/2020, 9:55 AM

## 2020-09-27 NOTE — Patient Care Conference (Signed)
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 Courtney Heys Care Coodinator Present: Dorthula Nettles, RN, BSN, CRRN;Christina Roosevelt, [place] Nurse Present: Serena Croissant, LPN PT Present: Magda Kiel, PT OT Present: Lillia Corporal, OT PPS Coordinator present : Gunnar Fusi, Novella Olive, PT     Current Status/Progress Goal Weekly Team Focus  Bowel/Bladder   pt is cont of b and bladder, is anuric. LBM 09/25/20  remain cont  assess q shift and prn   Swallow/Nutrition/ Hydration             ADL's   CGA/close supervision LB bathe/dress, bathing at sink level, functional mobility in room no AD CGA, edema in [place] I, lives alone but will have niece when she leaves  ADL retraining, functional mobility during ADL, standing balance/NMR, LB ADLs, IADLs   Mobility   contact guard no AD 116ft  mod I w/all  endurance, high level balance, functional gait   Communication             Safety/Cognition/ Behavioral Observations            Pain   pt has no current complaints of pain  remain free of pain  Assess q shift and prn   Skin   pt has incision where loop recorder was placed. Closed with steri strips  Healthy healing of incision site and no break down or infection  Assess q shift and orn     Discharge Planning:  Discharging independent  with assistance from niece. 1 Level home with elevator. Has: Single point cane, grab bars, and hand held showerr head   Team Discussion: Patient is anuric, continent bowel. Loop recorder site looks good, education on how to set that up at home has been completed. OT reports patient is bathing at sink level with supervision. Patient has mod I goals. PT reports they are focusing on strengthening, core strength, balance, with mod I goals. Patient has single point cane, grab bars in place, and HD on TTS. Patient on target to meet rehab goals: yes  *See Care Plan and progress notes for long and short-term goals.   Revisions to Treatment Plan:  Not at this time.  Teaching Needs: Continue family education.  Current Barriers to Discharge: Decreased caregiver support, Home enviroment access/layout, Wound care, Lack of/limited family support and Hemodialysis  Possible Resolutions to Barriers: Continue with current medications, reinforce Loop recorder education, wound care education on loop recorder site.     Medical Summary Current Status: anuric; here for CVA; LBM this AM; continent; loop recorder on board- looks good;  Barriers to Discharge: Decreased family/caregiver support;Home enviroment access/layout;Hemodialysis;Other (comments);Medical stability  Barriers to Discharge Comments: anuric; cerebellar CVA- poor balance- goal d/c mod I- niece to help at home- to move in; HD T/H/S; ESRD; regular BMs so far. Possible Resolutions to [employer]: can't shower due  to HD catheter; BP occ low- reduced meds; no assistive device in room CGA; focusing on balance/RLE strength- mod I goals- d/c 09/30/20- no driving x 1 month   Continued Need for Acute Rehabilitation Level of Care: The patient requires daily medical management by a physician with specialized training in physical medicine and rehabilitation for the following reasons: Direction of a multidisciplinary physical rehabilitation program to  maximize functional independence : Yes Medical management of patient stability for increased activity during participation in an intensive rehabilitation regime.: Yes Analysis of laboratory values and/or radiology reports with any subsequent need for medication adjustment and/or medical intervention. : Yes   I attest that I was present, lead the team conference, and concur with the assessment and plan of the team.   Cristi Loron 09/27/2020, 5:43 PM

## 2020-09-28 ENCOUNTER — Inpatient Hospital Stay (HOSPITAL_COMMUNITY): Payer: Medicare Other | Admitting: Occupational Therapy

## 2020-09-28 ENCOUNTER — Inpatient Hospital Stay (HOSPITAL_COMMUNITY): Payer: Medicare Other

## 2020-09-28 MED ORDER — CHLORHEXIDINE GLUCONATE CLOTH 2 % EX PADS
6.0000 | MEDICATED_PAD | Freq: Every day | CUTANEOUS | Status: DC
  Administered 2020-09-28 – 2020-09-30 (×2): 6 via TOPICAL

## 2020-09-28 NOTE — Progress Notes (Signed)
Patient ID: [name], female   DOB: 09-30-1976, 44 y.o.   MRN: [id number]   Patient turned down by [facility] due to [facility] being on hold.  [place], [place]

## 2020-09-28 NOTE — Progress Notes (Signed)
[place] PHYSICAL MEDICINE & REHABILITATION PROGRESS NOTE   Subjective/Complaints:  Pt ate 90% of breakfast- appropriate, no issues   ROS:   Pt denies SOB, abd pain, CP, N/V/C/D, and vision changes   Objective:   No results found. Recent Labs    09/27/20 1337  WBC 11.4*  HGB 10.5*  HCT 34.6*  PLT 158   Recent Labs    09/27/20 1337  NA 135  K 5.2*  CL 97*  CO2 22  GLUCOSE 297*  BUN 70*  CREATININE 9.31*  CALCIUM 8.6*    Intake/Output Summary (Last 24 hours) at 09/28/2020 0846 Last data filed at 09/28/2020 0809 Gross per 24 hour  Intake 537 ml  Output 2500 ml  Net -1963 ml        Physical Exam: Vital Signs Blood pressure 117/89, pulse 72, temperature 98.4 F (36.9 C), temperature source Oral, resp. rate 18, height 5\' 4"  (1.626 m), weight 57.6 kg, SpO2 100 %.  General: sitting up in bed- appropriate, NAD HEENT: poor denitition Neck: Supple without JVD or lymphadenopathy Heart: RRR Chest: CTA B/L- no W/R/R- good air movement HD catheter in R chest Abdomen: Soft, NT, ND, (+)BS  Extremities: Nonpitting edema, L upper arm- IV out-much less swollen Skin: Clean and intact without signs of breakdown; HDD catheter in R chest- looks OK- L upper arm IV looks OK Neuro: Ox3 . 4+/5 strength throughout Psych: pt appropriate    Assessment/Plan: 1. Functional deficits secondary to CVA which require 3+ hours per day of interdisciplinary therapy in a comprehensive inpatient rehab setting.  Physiatrist is providing close team supervision and 24 hour management of active medical problems listed below.  Physiatrist and rehab team continue to assess barriers to discharge/monitor patient progress toward functional and medical goals  Care Tool:  Bathing    Body parts bathed by patient: Right arm, Left arm, Chest, Abdomen, Front perineal area, Buttocks, Right upper leg, Left upper leg, Face, Right lower leg, Left lower leg   Body parts bathed by helper: Right lower  leg, Left lower leg     Bathing assist Assist Level: Supervision/Verbal cueing     Upper Body Dressing/Undressing Upper body dressing   What is the patient wearing?: Pull over shirt    Upper body assist Assist Level: Set up assist    Lower Body Dressing/Undressing Lower body dressing      What is the patient wearing?: Pants, Underwear/pull up     Lower body assist Assist for lower body dressing: Supervision/Verbal cueing     Toileting Toileting    Toileting assist Assist for toileting: Supervision/Verbal cueing     Transfers Chair/bed transfer  Transfers assist     Chair/bed transfer assist level: Contact Guard/Touching assist     Locomotion Ambulation   Ambulation assist      Assist level: Contact Guard/Touching assist Assistive device: No Device Max distance: 150   Walk 10 feet activity   Assist     Assist level: Supervision/Verbal cueing Assistive device: No Device   Walk 50 feet activity   Assist    Assist level: Contact Guard/Touching assist Assistive device: No Device    Walk 150 feet activity   Assist    Assist level: Contact Guard/Touching assist Assistive device: No Device    Walk 10 feet on uneven surface  activity   Assist     Assist level: Contact Guard/Touching assist Assistive device:  (no device)   Wheelchair     Assist Will patient use wheelchair at  discharge?: No             Wheelchair 50 feet with 2 turns activity    Assist            Wheelchair 150 feet activity     Assist          Blood pressure 117/89, pulse 72, temperature 98.4 F (36.9 C), temperature source Oral, resp. rate 18, height 5\' 4"  (1.626 m), weight 57.6 kg, SpO2 100 %.    Medical Problem List and Plan: 1.  Vertigo, dizziness and unstable gait secondary to acute cerebellar vermis small infarct.  Status post loop recorder 11/4             -patient may shower             -ELOS/Goals: modI 10-12 days              -Continue CIR 2.  Antithrombotics: -DVT/anticoagulation: SCDs             -antiplatelet therapy: Aspirin 325 mg daily  11/10- stop plavix per neurology 3. Pain Management: Tylenol as needed. Well controlled  11/8- take for LUE pain- prn-con't  11/9- pain better- con't meds prn 4. Mood: Paxil 40 mg daily, trazodone 100 mg nightly. In positive mood.             -antipsychotic agents: N/A 5. Neuropsych: This patient is capable of making decisions on her own behalf. 6. Skin/Wound Care: Routine skin checks  11/8- remove LUE IV so BP cuff doesn't push on IV 7. Fluids/Electrolytes/Nutrition: Routine in and outs with follow-up chemistries 8.  End-stage renal disease.  Continue hemodialysis per renal services.  Outpatient fistulogram to be scheduled. T/Th/Sa schedule. 9.  Lupus.  Chronic prednisone 10 mg daily.  Follow-up outpatient rheumatology services 10.  Mitral stenosis.  Follow-up cardiology services. Loop recorder placed 11/4. 11.  Hypertension.  Lopressor 25 mg twice daily, Cardizem 120 mg nightly.  Monitor with increased mobility. Has had low Bps w/ lightheadedness 11/4 and medications were decreased as a result.  11/7: elevated diastolic- continue to monitor   11/8- DBP slightly elevated, but otherwise SBP controlled- con't regimen for now  11/9- DBP still elevated slightly 130/91- won't change meds as of yet.   11/10- BP controlled this AM- con't regimen 12.  Hyperlipidemia.  Lipitor 40mg  daily. 13. Hyperkalemia  11/10- pt's K+ 5.2- will d/w nephrology so they can change treatment/treat-   LOS: 5 days A FACE TO FACE EVALUATION WAS PERFORMED  Osaze Hubbert 09/28/2020, 8:46 AM

## 2020-09-28 NOTE — Progress Notes (Signed)
Patient ID: [name], female   DOB: 08/30/1976, 44 y.o.   MRN: [id number]   [name] ordered through [facility]. Patient preference to get bathroom DME privately.  [place], [place]

## 2020-09-28 NOTE — Progress Notes (Signed)
Patient ID: [name], female   DOB: Jun 30, 1976, 44 y.o.   MRN: [id number]   Patient turned down by [facility] due to insurance  [place], [place]

## 2020-09-28 NOTE — Progress Notes (Signed)
Physical Therapy Session Note  Patient Details  Name: [name] MRN: [id number] Date of Birth: 06/10/1976  Today's Date: 09/28/2020 PT Individual Time:Session1: 1130-1200; [place]: 1515-1600 PT Individual Time Calculation (min): 30 min & 45 min  Short Term Goals: Week 1:  PT Short Term Goal 1 (Week 1): = LTGs  Skilled Therapeutic Interventions/Progress Updates:    Session1: Patient in supine, independent to sit up and donned shoes EOB.  Patient S for sit to stand and stood momentarily to ensure not light headed.  Ambulated with S no device to gym x 160'.  Discussed using cane in the community and practiced after education on sequencing of cane with SPC x 200' with S min cues for sequencing and gait pattern.  Patient seated EOB for trunk rotation using weighted blue ball going hip to hip for core strength.  Negotiated 4 6" steps with rails vs rail and cane and close S cues for sequencing.  Patient reports neighbor can driver her to dialysis when mentioning conference yesterday and that MD states cannot drive for couple months.  Patient ambulated to room x 160' with S using SPC and left seated EOB with call bell in reach and bed alarm activated.  [place]:  Patient in supine and reports feeling fatigued.  Independent supine to sit and to don shoes.  Patient sit to stand S and increased time to regain balance with cane initially due to LE weakness. Patient ambulated initially CGA due to LE weakness, backing off to S with cane x 140'.  Patient seated for HEP instruction.  Patient performed sit to stand x 5 no UE support.  Standing at counter hip abduction, heel raises, marching, and hamstring curls all x 10 with UE support.  Issued all in HEP as noted below.  Patient on Nu Step x 5 minutes with 3 rest breaks UE/LE's at level 1.  Performed Berg balance assessment, score 43/56.  Discussed fall risk and using cane, footwear, lighting, rubber mat and grabbars in bathroom for fall prevention.  Also  educated on energy conservation.  Patient ambulated to her room with S with SPC and left in bed with call bell in reach and bed alarm set.   Access Code: [email or web address] URL: [email or web address] Date: 09/28/2020 Prepared by: Caren Griffins  Exercises Sit to Stand - 2 x daily - 7 x weekly - 1 sets - 5 reps Standing Hip Abduction with Counter Support - 2 x daily - 7 x weekly - 1 sets - 10 reps Standing March with Counter Support - 2 x daily - 7 x weekly - 1 sets - 10 reps Heel rises with counter support - 2 x daily - 7 x weekly - 1 sets - 10 reps Standing Alternating Knee Flexion - 2 x daily - 7 x weekly - 1 sets - 10 reps  Therapy Documentation Precautions:  Precautions Precautions: Fall Precaution Comments: orthostatic BP Restrictions Weight Bearing Restrictions: No Pain: Pain Assessment Pain Scale: 0-10 Pain Score: 0-No pain  Balance: Balance Balance Assessed: Yes Berg Balance Test Sit to Stand: Able to stand  independently using hands Standing Unsupported: Able to stand safely 2 minutes Sitting with Back Unsupported but Feet Supported on Floor or Stool: Able to sit safely and securely 2 minutes Stand to Sit: Sits safely with minimal use of hands Transfers: Able to transfer safely, definite need of hands Standing Unsupported with Eyes Closed: Able to stand 10 seconds safely Standing Ubsupported with Feet Together: Able to place feet together independently and stand 1  minute safely From Standing, Reach Forward with Outstretched Arm: Can reach confidently >25 cm (10") From Standing Position, Pick up Object from Floor: Able to pick up shoe safely and easily From Standing Position, Turn to Look Behind Over each Shoulder: Turn sideways only but maintains balance Turn 360 Degrees: Able to turn 360 degrees safely but slowly Standing Unsupported, Alternately Place Feet on Step/Stool: Able to complete 4 steps without aid or supervision Standing Unsupported, One Foot in Front: Able  to plae foot ahead of the other independently and hold 30 seconds Standing on One Leg: Unable to try or needs assist to prevent fall Total Score: 43    Therapy/Group: Individual Therapy  Reginia Naas  Magda Kiel, PT 09/28/2020, 8:26 AM

## 2020-09-28 NOTE — Discharge Summary (Signed)
Physician Discharge Summary  Patient ID: [name] MRN: [id number] DOB/AGE: 1975/11/28 44 y.o.  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 Multiplanar CT image reconstructions and MIPs were obtained to evaluate the vascular anatomy. Carotid stenosis measurements (when applicable) are obtained utilizing NASCET criteria, using the distal internal carotid diameter as the denominator. CONTRAST:  68m OMNIPAQUE IOHEXOL 350 MG/ML SOLN COMPARISON:  None. FINDINGS: CT HEAD Brain: There is a small area hypoattenuation corresponding to area of midline inferior cerebellar infarction on prior MRI. There is no acute intracranial hemorrhage or mass effect. No acute appearing loss of gray-white differentiation. Chronic left parietal infarct. There is no extra-axial fluid collection. Ventricles and sulci are within normal limits in size and configuration. Vascular: Better evaluated on CTA portion. Skull: Calvarium is unremarkable. Sinuses/Orbits: Bilateral proptosis.  No acute finding. Other: None. Review of the MIP images confirms the above findings CTA NECK Aortic arch: Great vessel origins are patent. There is direct origin of the left vertebral artery from the arch. Right carotid system: Patent. Minimal calcified plaque at the ICA origin without measurable stenosis. Left carotid system: Patent. Mild calcified plaque at the ICA  origin without measurable stenosis. Vertebral arteries: Patent.  Left vertebral artery is dominant. Skeleton: No significant abnormality. Other neck: No mass or adenopathy. Upper chest: No apical lung mass. Right IJ approach catheter enters the SVC. Review of the MIP images confirms the above findings CTA HEAD Anterior circulation: Intracranial internal carotid arteries are patent with calcified plaque. Moderate stenosis of the supraclinoid portions bilaterally. Anterior and middle cerebral arteries are patent. Posterior circulation: Intracranial vertebral arteries are patent. Right vertebral artery becomes diminutive after PICA origin. Both PICA origins are patent. Basilar artery is patent. Superior cerebellar artery origins are patent. Posterior cerebral arteries are patent. Bilateral posterior communicating arteries are present with fetal origin of the left posterior cerebral artery. Venous sinuses: Patent as allowed by contrast bolus timing. Review of the MIP images confirms the above findings IMPRESSION: Small acute midline cerebellar infarct as seen on MRI. No new intracranial abnormality. No hemodynamically significant stenosis in the neck. No proximal intracranial vessel occlusion. 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 Electronically Signed   By: PMacy MisM.D.   On: 09/18/2020 08:01   CT ANGIO NECK W OR WO CONTRAST  Result Date: 09/18/2020 CLINICAL DATA:  Small cerebellar stroke on MRI EXAM: CT ANGIOGRAPHY HEAD AND NECK TECHNIQUE: Multidetector CT imaging of the head and neck was performed using the standard protocol during bolus administration of intravenous contrast. Multiplanar CT image reconstructions and MIPs were obtained to evaluate the vascular anatomy. Carotid stenosis measurements (when applicable) are obtained utilizing NASCET criteria, using the distal internal carotid diameter as the denominator. CONTRAST:  773mOMNIPAQUE IOHEXOL 350 MG/ML SOLN  COMPARISON:  None. FINDINGS: CT HEAD Brain: There is a small area hypoattenuation corresponding to area of midline inferior cerebellar infarction on prior MRI. There is no acute intracranial hemorrhage or mass effect. No acute appearing loss of gray-white differentiation. Chronic left parietal infarct.  There is no extra-axial fluid collection. Ventricles and sulci are within normal limits in size and configuration. Vascular: Better evaluated on CTA portion. Skull: Calvarium is unremarkable. Sinuses/Orbits: Bilateral proptosis.  No acute finding. Other: None. Review of the MIP images confirms the above findings CTA NECK Aortic arch: Great vessel origins are patent. There is direct origin of the left vertebral artery from the arch. Right carotid system: Patent. Minimal calcified plaque at the ICA origin without measurable stenosis. Left carotid system: Patent. Mild calcified plaque at the ICA origin without measurable stenosis. Vertebral arteries: Patent.  Left vertebral artery is dominant. Skeleton: No significant abnormality. Other neck: No mass or adenopathy. Upper chest: No apical lung mass. Right IJ approach catheter enters the SVC. Review of the MIP images confirms the above findings CTA HEAD Anterior circulation: Intracranial internal carotid arteries are patent with calcified plaque. Moderate stenosis of the supraclinoid portions bilaterally. Anterior and middle cerebral arteries are patent. Posterior circulation: Intracranial vertebral arteries are patent. Right vertebral artery becomes diminutive after PICA origin. Both PICA origins are patent. Basilar artery is patent. Superior cerebellar artery origins are patent. Posterior cerebral arteries are patent. Bilateral posterior communicating arteries are present with fetal origin of the left posterior cerebral artery. Venous sinuses: Patent as allowed by contrast bolus timing. Review of the MIP images confirms the above findings IMPRESSION: Small acute midline  cerebellar infarct as seen on MRI. No new intracranial abnormality. No hemodynamically significant stenosis in the neck. No proximal intracranial vessel occlusion. 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 Electronically Signed   By: Macy Mis M.D.   On: 09/18/2020 08:01   MR BRAIN WO CONTRAST  Result Date: 09/17/2020 CLINICAL DATA:  Dizziness EXAM: MRI HEAD WITHOUT CONTRAST TECHNIQUE: Multiplanar, multiecho pulse sequences of the brain and surrounding structures were obtained without intravenous contrast. COMPARISON:  None. FINDINGS: Motion artifact is present. Brain: There is an 8 mm focus of mildly reduced diffusion involving the midline inferior vermis. Chronic left parietal infarct with chronic blood products. There is no intracranial mass, mass effect, or edema. There is no hydrocephalus or extra-axial fluid collection. Ventricles and sulci are normal in size and configuration. Vascular: Major vessel flow voids at the skull base are preserved. Skull and upper cervical spine: Normal marrow signal is preserved. Sinuses/Orbits: Paranasal sinuses are aerated. Orbits are unremarkable. Other: Sella is unremarkable.  Mastoid air cells are clear. IMPRESSION: Motion degraded. Subcentimeter acute infarct involving the midline inferior vermis. Chronic left parietal infarct. Electronically Signed   By: Macy Mis M.D.   On: 09/17/2020 13:59   EP PPM/ICD IMPLANT  Result Date: 09/22/2020 SURGEON:  Thompson Grayer, MD   PREPROCEDURE DIAGNOSIS:  Cryptogenic Stroke   POSTPROCEDURE DIAGNOSIS:  Cryptogenic Stroke    PROCEDURES:  1. Implantable loop recorder implantation   INTRODUCTION:  ALLANTE BEANE is a 44 y.o. female with a history of unexplained stroke who presents today for implantable loop implantation.  The patient has had a cryptogenic stroke.  Despite an extensive workup by neurology, no reversible causes have been identified.  she has worn telemetry during which  she did not have arrhythmias.  There is significant concern for possible atrial fibrillation as the cause for the patients stroke.  The patient therefore presents today for implantable loop implantation.   DESCRIPTION OF PROCEDURE:  Informed written consent was obtained.  The patient required no sedation for the procedure today.  The patients left chest was prepped and draped. Mapping over the patient's chest was  performed to identify the appropriate ILR site.  This area was found to be the left  parasternal region over the 3rd-4th intercostal space.  The skin overlying this region was infiltrated with lidocaine for local analgesia.  A 0.5-cm incision was made at the implant site.  A subcutaneous ILR pocket was fashioned using a combination of sharp and blunt dissection.  A Medtronic Reveal Linq model [id number] implantable loop recorder was then placed into the pocket R waves were very prominent and measured > 0.2 mV. EBL<1 ml.  Steri- Strips and a sterile dressing were then applied.  There were no early apparent complications.   CONCLUSIONS:  1. Successful implantation of a Medtronic Reveal LINQ implantable loop recorder for cryptogenic stroke  2. No early apparent complications. Thompson Grayer MD, [facility] 09/22/2020 3:04 PM   DG Chest Port 1 View  Result Date: 09/22/2020 CLINICAL DATA:  Leukocytosis EXAM: PORTABLE CHEST 1 VIEW COMPARISON:  Radiograph 09/17/2020 FINDINGS: Dual lumen right IJ catheter tip approximates the right atrium in similar position to prior. Implantable loop recorder projects over the left heart. Telemetry leads overlie the chest. Bandlike areas of opacity in the lung bases likely reflecting combination of subsegmental atelectasis or scarring. Suspect some small bilateral effusions with some adjacent passive atelectasis. Underlying airspace disease would be difficult to exclude though no other focal consolidative process is seen. Mild central vascular congestion and cuffing is present which could  bowl I some mild pulmonary edema. Cardiac contours are unchanged from prior with a mildly calcified aorta. 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 Vascular stenting in the left axilla without acute complication of the visible segment. Remaining soft tissues are free of acute abnormality. IMPRESSION: 1. Bandlike areas of opacity in the lung bases likely reflecting combination of subsegmental atelectasis or scarring. 2. Suspect least mild interstitial edema and small bilateral effusions with some adjacent passive atelectasis. Underlying airspace disease/infection cannot be fully excluded on radiography. Electronically Signed   By: Lovena Le M.D.   On: 09/22/2020 20:20   VAS US DUPLEX DIALYSIS ACCESS (AVF,AVG)  Result Date: 09/09/2020 DIALYSIS ACCESS Reason for Exam: Bruising on right fistula site, arm swelling. Access Site: Right Upper Extremity. Access Type: 2nd stage basilic vein transposition 07/01/2020. Performing Technologist: Ronal Fear RVS, RCS  Examination Guidelines: A complete evaluation includes B-mode imaging, spectral Doppler, color Doppler, and power Doppler as needed of all accessible portions of each vessel. Unilateral testing is considered an integral part of a complete examination. Limited examinations for reoccurring indications may be performed as noted.  Findings: +--------------------+----------+-----------------+--------+ AVF                 PSV (cm/s)Flow Vol (mL/min)Comments +--------------------+----------+-----------------+--------+ Native artery inflow   182           669                +--------------------+----------+-----------------+--------+ AVF Anastomosis        285                              +--------------------+----------+-----------------+--------+  +------------+----------+-------------+----------+-----------------------------+ OUTFLOW VEINPSV  (cm/s)Diameter (cm)Depth (cm)          Describe            +------------+----------+-------------+----------+-----------------------------+ Shoulder       381        0.77        0.75                                 +------------+----------+-------------+----------+-----------------------------+  Prox UA         95        0.70        0.18   change in diameter to 0.39 cm                                                       & 475 cm/s           +------------+----------+-------------+----------+-----------------------------+ Mid UA         103        1.17        0.16        partially-occlusive      +------------+----------+-------------+----------+-----------------------------+ Dist UA        578        0.70        0.31                                 +------------+----------+-------------+----------+-----------------------------+ AC Fossa       422        0.55        0.81        partially-occlusive      +------------+----------+-------------+----------+-----------------------------+   Summary: Patent arteriovenous fistula with an area of dilatation and mild thrombus involving the mid segment of the basilic outflow vein. Mild thrombus at the anastomosis. Diameter change in the shoulder/proximal upper arm segment of the basilic outflow vein with increased velocity of 475 cm/s. *See table(s) above for measurements and observations.  Diagnosing physician: Servando Snare MD Electronically signed by Servando Snare MD on 09/09/2020 at 2:32:48 PM.    --------------------------------------------------------------------------------   Final    ECHOCARDIOGRAM COMPLETE  Result Date: 09/18/2020    ECHOCARDIOGRAM REPORT   Patient Name:   HARJIT DOUDS Date of Exam: 09/18/2020 Medical Rec #:  [id number]          Height:       64.0 in Accession #:    [id number]         Weight:       135.6 lb Date of Birth:  08-16-1976           BSA:          1.658 m Patient Age:    80 years           BP:            134/97 mmHg Patient Gender: F                  HR:           87 bpm. Exam Location:  Inpatient Procedure: 2D Echo, Cardiac Doppler and Color Doppler Indications:    Stroke  History:        Patient has prior history of Echocardiogram examinations, most                 recent 12/18/2017. Mitral Valve Disease; Risk                 Factors:Hypertension. ESRD. Lupus.  Sonographer:    Clayton Lefort RDCS (AE) Referring Phys: [id number] [place]  1. Left ventricular ejection fraction, by estimation, is 55 to 60%. The left ventricle has normal function. The left ventricle has no regional wall  motion abnormalities. There is severe concentric left ventricular hypertrophy. Diastolic function indeterminant due to moderate-to-severe MAC.  2. Right ventricular systolic function is mildly reduced. The right ventricular size is normal. There is moderately elevated pulmonary artery systolic pressure.  3. Left atrial size was severely dilated.  4. Right atrial size was mildly dilated.  5. The mitral valve is degenerative. Moderate mitral valve regurgitation. Severe mitral stenosis with mean gradient 9mHg and MVA of 0.73cm2 by continuity equation at HR 88bpm. Moderate to severe mitral annular calcification.  6. Tricuspid valve regurgitation is severe.  7. The aortic valve is tricuspid. There is mild calcification of the aortic valve. There is mild thickening of the aortic valve. Aortic valve regurgitation is mild. Mild to moderate aortic valve sclerosis/calcification is present, without any evidence of aortic stenosis.  8. The inferior vena cava is normal in size with <50% respiratory variability, suggesting right atrial pressure of 8 mmHg. Comparison(s): Compared to prior study on 11/2017, there is now severe calcific mitral stenosis and severe tricuspid regurgitation. FINDINGS  Left Ventricle: Left ventricular ejection fraction, by estimation, is 55 to 60%. The left ventricle has normal function. The left ventricle  has no regional wall motion abnormalities. The left ventricular internal cavity size was normal in size. There is  severe concentric left ventricular hypertrophy. Indeterminant due to moderate-to-severe MAC. Right Ventricle: The right ventricular size is normal. Right vetricular wall thickness was not well visualized. Right ventricular systolic function is mildly reduced. There is moderately elevated pulmonary artery systolic pressure. The tricuspid regurgitant velocity is 3.51 m/s, and with an assumed right atrial pressure of 8 mmHg, the estimated right ventricular systolic pressure is 581.1mmHg. Left Atrium: Left atrial size was severely dilated. Right Atrium: Right atrial size was mildly dilated. Pericardium: There is no evidence of pericardial effusion. Mitral Valve: The mitral valve is degenerative in appearance. There is moderate thickening of the mitral valve leaflet(s). There is moderate calcification of the mitral valve leaflet(s). Moderate to severe mitral annular calcification. Moderate mitral valve regurgitation. Severe mitral stenosis with mean gradient 143mg and MVA of 0.73cm2 by continuity equation. Tricuspid Valve: The tricuspid valve is normal in structure. Tricuspid valve regurgitation is severe. No evidence of tricuspid stenosis. Aortic Valve: The aortic valve is tricuspid. There is mild calcification of the aortic valve. There is mild thickening of the aortic valve. Aortic valve regurgitation is mild. Aortic regurgitation PHT measures 816 msec. Mild to moderate aortic valve sclerosis/calcification is present, without any evidence of aortic stenosis. Aortic valve mean gradient measures 4.0 mmHg. Aortic valve peak gradient measures 8.1 mmHg. Aortic valve area, by VTI measures 2.21 cm. Pulmonic Valve: The pulmonic valve was normal in structure. Pulmonic valve regurgitation is not visualized. No evidence of pulmonic stenosis. Aorta: The aortic root and ascending aorta are structurally normal, with  no evidence of dilitation. Venous: The inferior vena cava is normal in size with less than 50% respiratory variability, suggesting right atrial pressure of 8 mmHg. IAS/Shunts: No atrial level shunt detected by color flow Doppler.  LEFT VENTRICLE PLAX 2D LVIDd:         2.50 cm  Diastology LVIDs:         1.80 cm  LV e' medial:    5.25 cm/s LV PW:         1.80 cm  LV E/e' medial:  36.8 LV IVS:        2.00 cm  LV e' lateral:   3.98 cm/s LVOT diam:  1.90 cm  LV E/e' lateral: 48.5 LV SV:         54 LV SV Index:   32 LVOT Area:     2.84 cm  RIGHT VENTRICLE             IVC RV Basal diam:  3.50 cm     IVC diam: 1.70 cm RV S prime:     10.30 cm/s TAPSE (M-mode): 1.7 cm LEFT ATRIUM             Index       RIGHT ATRIUM           Index LA diam:        3.80 cm 2.29 cm/m  RA Area:     16.80 cm LA Vol (A2C):   77.5 ml 46.73 ml/m RA Volume:   46.20 ml  27.86 ml/m LA Vol (A4C):   71.0 ml 42.81 ml/m LA Biplane Vol: 74.8 ml 45.10 ml/m  AORTIC VALVE AV Area (Vmax):    2.40 cm AV Area (Vmean):   2.45 cm AV Area (VTI):     2.21 cm AV Vmax:           142.00 cm/s AV Vmean:          99.000 cm/s AV VTI:            0.244 m AV Peak Grad:      8.1 mmHg AV Mean Grad:      4.0 mmHg LVOT Vmax:         120.00 cm/s LVOT Vmean:        85.400 cm/s LVOT VTI:          0.190 m LVOT/AV VTI ratio: 0.78 AI PHT:            816 msec  AORTA Ao Root diam: 2.90 cm Ao Asc diam:  3.00 cm MITRAL VALVE                 TRICUSPID VALVE MV Area (PHT): 1.98 cm      TR Peak grad:   49.3 mmHg MV Peak grad:  26.8 mmHg     TR Vmax:        351.00 cm/s MV Mean grad:  16.0 mmHg MV Vmax:       2.59 m/s      SHUNTS MV Vmean:      188.0 cm/s    Systemic VTI:  0.19 m MV Decel Time: 383 msec      Systemic Diam: 1.90 cm MR Peak grad:    120.6 mmHg MR Mean grad:    84.0 mmHg MR Vmax:         549.00 cm/s MR Vmean:        437.0 cm/s MR PISA:         2.26 cm MR PISA Eff ROA: 16 mm MR PISA Radius:  0.60 cm MV E velocity: 193.00 cm/s MV A velocity: 221.00 cm/s MV E/A ratio:   0.87 Gwyndolyn Kaufman MD Electronically signed by Gwyndolyn Kaufman MD Signature Date/Time: 09/18/2020/1:21:39 PM    Final    ECHO TEE  Result Date: 09/20/2020    TRANSESOPHOGEAL ECHO REPORT   Patient Name:   AMAREA MACDOWELL Date of Exam: 09/20/2020 Medical Rec #:  [id number]          Height:       64.0 in Accession #:    [id number]         Weight:  127.4 lb Date of Birth:  [birth year]           BSA:          1.615 m Patient Age:    47 years           BP:           180/95 mmHg Patient Gender: F                  HR:           107 bpm. Exam Location:  Inpatient Procedure: Transesophageal Echo, 3D Echo, Color Doppler, Cardiac Doppler and            Saline Contrast Bubble Study Indications:     Stroke  History:         Patient has prior history of Echocardiogram examinations, most                  recent 09/18/2020. Risk Factors:Hypertension, Dyslipidemia and                  Lupus.  Sonographer:     Raquel Sarna Senior RDCS Referring Phys:  [place] Diagnosing Phys: Mertie Moores MD PROCEDURE: After discussion of the risks and benefits of a TEE, an informed consent was obtained from the patient. The transesophogeal probe was passed without difficulty through the esophogus of the patient. Local oropharyngeal anesthetic was provided with Cetacaine. Sedation performed by different physician. The patient was monitored while under deep sedation. Anesthestetic sedation was provided intravenously by Anesthesiology: 15m of Propofol, 493mof Lidocaine. The patient developed no complications during the procedure. IMPRESSIONS  1. Left ventricular ejection fraction, by estimation, is 55 to 60%. The left ventricle has normal function.  2. Right ventricular systolic function is normal. The right ventricular size is normal.  3. No left atrial/left atrial appendage thrombus was detected.  4. The mean MS gradient is 14 mmhg.     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 This could be the cause of her stroke.     3-D images of the mitral valve were obtained. . The mitral valve is abnormal. Moderate to severe mitral valve regurgitation. Moderate to severe mitral stenosis. Moderate to severe mitral annular calcification.  5. Tricuspid valve regurgitation is moderate to severe.  6. The aortic valve is grossly normal. Aortic valve regurgitation is mild to moderate. FINDINGS  Left Ventricle: Left ventricular ejection fraction, by estimation, is 55 to 60%. The left ventricle has normal function. The left ventricular internal cavity size was normal in size. Right Ventricle: The right ventricular size is normal. No increase in right ventricular wall thickness. Right ventricular systolic function is normal. Left Atrium: Left atrial size was not well visualized. No left atrial/left atrial appendage thrombus was detected. Right Atrium: Right atrial size was not well visualized. Pericardium: There is no evidence of pericardial effusion. Mitral Valve: The mean MS gradient is 14 mmhg. 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 This could be the cause of her stroke. 3-D images of the mitral valve were obtained. The mitral valve is abnormal. There is severe thickening of the mitral valve leaflet(s). There is severe calcification of the mitral valve leaflet(s). Moderate to severe mitral annular calcification. Moderate  to severe mitral valve regurgitation. Moderate to severe mitral valve stenosis.  MV peak gradient, 22.1 mmHg. The mean mitral valve gradient is 14.0 mmHg. Pulmonary venous flow is normal. Tricuspid Valve: The tricuspid valve is grossly normal. Tricuspid valve regurgitation is moderate to severe. Aortic Valve: The aortic valve is grossly normal. Aortic valve regurgitation is mild to moderate. Pulmonic Valve: The pulmonic valve was grossly  normal. Pulmonic valve regurgitation is not visualized. Aorta: The aortic root and ascending aorta are structurally normal, with no evidence of dilitation. IAS/Shunts: No atrial level shunt detected by color flow Doppler. Agitated saline contrast was given intravenously to evaluate for intracardiac shunting. With bubble contrast, there were bubbles that appeared in the LA aproximately 8-9 cardiac cycles after injection. These are most likely due to a transpulmonary AVM or shunt. This is not c/w an ASD or PFO.  MITRAL VALVE MV Peak grad: 22.1 mmHg MV Mean grad: 14.0 mmHg MV Vmax:      2.35 m/s MV Vmean:     178.0 cm/s Mertie Moores MD Electronically signed by Mertie Moores MD Signature Date/Time: 09/20/2020/5:51:40 PM    Final    VAS [place] LOWER EXTREMITY VENOUS (DVT)  Result Date: 09/18/2020  Lower Venous DVT Study Indications: Stroke, and Lupus.  Comparison Study: No prior study Performing Technologist: Sharion Dove RVS  Examination Guidelines: A complete evaluation includes B-mode imaging, spectral Doppler, color Doppler, and power Doppler as needed of all accessible portions of each vessel. Bilateral testing is considered an integral part of a complete examination. Limited examinations for reoccurring indications may be performed as noted. The reflux portion of the exam is performed with the patient in reverse Trendelenburg.  +---------+---------------+---------+-----------+----------+--------------+ RIGHT    CompressibilityPhasicitySpontaneityPropertiesThrombus Aging +---------+---------------+---------+-----------+----------+--------------+ CFV      Full           Yes      Yes                                 +---------+---------------+---------+-----------+----------+--------------+ SFJ      Full                                                        +---------+---------------+---------+-----------+----------+--------------+ FV Prox  Full                                                         +---------+---------------+---------+-----------+----------+--------------+ FV Mid   Full                                                        +---------+---------------+---------+-----------+----------+--------------+ FV DistalFull                                                        +---------+---------------+---------+-----------+----------+--------------+ PFV      Full                                                        +---------+---------------+---------+-----------+----------+--------------+  POP      Full           Yes      Yes                                 +---------+---------------+---------+-----------+----------+--------------+ PTV      Full                                                        +---------+---------------+---------+-----------+----------+--------------+ PERO     Full                                                        +---------+---------------+---------+-----------+----------+--------------+   +---------+---------------+---------+-----------+----------+--------------+ LEFT     CompressibilityPhasicitySpontaneityPropertiesThrombus Aging +---------+---------------+---------+-----------+----------+--------------+ CFV      Full           Yes      Yes                                 +---------+---------------+---------+-----------+----------+--------------+ SFJ      Full                                                        +---------+---------------+---------+-----------+----------+--------------+ FV Prox  Full                                                        +---------+---------------+---------+-----------+----------+--------------+ FV Mid   Full                                                        +---------+---------------+---------+-----------+----------+--------------+ FV DistalFull                                                         +---------+---------------+---------+-----------+----------+--------------+ PFV      Full                                                        +---------+---------------+---------+-----------+----------+--------------+ POP      Full           Yes      Yes                                 +---------+---------------+---------+-----------+----------+--------------+  PTV      Full                                                        +---------+---------------+---------+-----------+----------+--------------+ PERO     Full                                                        +---------+---------------+---------+-----------+----------+--------------+     Summary: BILATERAL: - No evidence of deep vein thrombosis seen in the lower extremities, bilaterally. -   *See table(s) above for measurements and observations. Electronically signed by [name] MD on 09/18/2020 at 2:08:18 PM.    Final     Labs:  Basic Metabolic Panel: Recent Labs  Lab 09/24/20 1437 09/27/20 1337 09/29/20 1446  NA 139 135 135  K 3.5 5.2* 5.1  CL 101 97* 99  CO2 _0 GLUCOSE 168* 297* 281*  BUN 42* 70* 74*  CREATININE 5.93* 9.31* 8.80*  CALCIUM 8.0* 8.6* 8.5*  PHOS 4.4 5.5* 5.5*    CBC: Recent Labs  Lab 09/24/20 1436 09/27/20 1337 09/29/20 1524  WBC 9.0 11.4* 10.2  HGB 9.8* 10.5* 10.0*  HCT 31.3* 34.6* 32.3*  MCV 93.2 94.3 92.3  PLT 99* 158 120*    CBG: No results for input(s): GLUCAP in the last 168 hours.  Family history.  Sister with asthma mother with hypertension and myocardial infarction.  Maternal uncle with COPD.  Denies any esophageal cancer diabetes mellitus or rectal cancer  Brief HPI:   JANICIA MONTERROSA is a 44 y.o. right-handed female with history of lupus, end-stage renal disease with hemodialysis, hypertension depression anxiety as well as avascular necrosis secondary chronic prednisone use.  Patient lives alone 1 level home independent with assistive  device.  Patient does not drive receive transportation via a neighbor.  She states she has a niece that assist as needed.  Presented 09/17/2020 with dizziness nausea vomiting as well as stool incontinence.  She initially presented to [facility] urgent care with dizziness and provided with antiemetics diagnosed with classic vertigo discharged to home.  Patient with progressive symptoms and came to [facility] for further evaluation.  MRI of the brain showed subcentimeter acute infarction involving the midline inferior vermis.  Chronic left parietal infarction.  CT angiogram of head and neck no hemodynamically significant stenosis in the neck.  No proximal intracranial vessel occlusion.  Admission chemistries potassium 5.3 glucose 170 BUN 56 creatinine 8.21 AST 78 ALT 105 alcohol negative hemoglobin 10.9.  Echocardiogram with severe left atrial dilation and mitral stenosis in the setting of significant annular calcification.  TEE showed normal left ventricular function no AAS or thrombi.  Maintained on aspirin as well as plan for loop recorder placement.  No DAPT due to thrombocytopenia.  Hemodialysis ongoing as per renal services with plan to schedule outpatient fistulogram.  Therapy evaluations completed and patient was admitted for a comprehensive rehab program   Hospital Course: CHEYENNE BORDEAUX was admitted to rehab 09/23/2020 for inpatient therapies to consist of PT, ST and OT at least three hours five days a week. Past admission physiatrist, therapy team and rehab RN have worked together  to provide customized collaborative inpatient rehab.  Pertaining to patient's acute cerebellar vermis infarction remained stable maintained on aspirin therapy.  Avoid DAPT given bouts of thrombocytopenia.  Patient would follow-up neurology services.  Hospital course noted mitral stenosis placing a loop recorder 09/22/2020 follow-up per cardiology services.  No chest pain or shortness of breath.  Hemodialysis ongoing as per  renal services plan for outpatient fistulogram.  Patient did have a history of lupus chronic prednisone use follow-up rheumatology services.  Blood pressure controlled on Lopressor.  Chronic anemia Aranesp as advised follow-up CBCs with hemodialysis.  Mood stabilization with Paxil she was attending full therapy.  Trazodone was used at nighttime to aid in sleep.  Lipitor for hyperlipidemia.   Blood pressures were monitored on TID basis and controlled      Rehab course: During patient's stay in rehab weekly team conferences were held to monitor patient's progress, set goals and discuss barriers to discharge. At admission, patient required minimal assist stand pivot transfers modified independent supine to sit.  Minimal assist 50 feet straight cane.  Supervision lower body bathing minimal guard upper body bathing minimal guard transfers  Physical exam.  Blood pressure 108/86 pulse 68 temperature 97.7 respirations 18 oxygen saturation 9 9% room air Constitutional.  No acute distress HEENT Head.  Normocephalic and atraumatic Eyes.  Pupils round and reactive to light no discharge without nystagmus Neck.  Supple nontender no JVD without thyromegaly Cardiac regular rate rhythm without extra sounds or murmur heard Abdomen.  Soft nontender positive bowel sounds not rebound Respiratory effort normal no respiratory distress without wheeze Extremities.  Nonpitting edema left greater than right Skin.  Clean and intact Neurologic.  Alert oriented follows commands.  Strength grossly graded 4+/5 throughout  /She  has had improvement in activity tolerance, balance, postural control as well as ability to compensate for deficits. Marlana Salvage has had improvement in functional use RUE/LUE  and RLE/LLE as well as improvement in awareness.  Working with energy conservation techniques.  Patient supine to sit supervision sit to stand without assistive device contact-guard ambulates 75 feet with contact-guard assist.   Ambulates short distance of the sink for ADLs close supervision no assistive device doff clothing with supervision in sitting and standing upper lower body bathing at sink with supervision.  Able to stand for bathing task for approximately 10 minutes.  Full family teaching completed plan discharge to home       Disposition: Discharged to home    Diet: Renal diet  Special Instructions: No driving smoking or alcohol  Continue hemodialysis as directed  Medications at discharge 1.  Tylenol as needed 2.  Aspirin 325 mg p.o. daily 3.  Lipitor 40 mg p.o. daily 4.  Calcium acetate 2001 mg 3 times daily 5.  Lopressor 25 mg twice daily 6.  Rena-Vite 1 tablet nightly 7.  Protonix 40 mg p.o. daily 8.  Paxil 40 mg p.o. daily 9.  Prednisone 10 mg p.o. breakfast 10.  Trazodone 100 mg nightly  30-35 minutes were spent completing discharge summary discharge planning  Discharge Instructions    Ambulatory referral to Neurology   Complete by: As directed    Follow up with Dr. Leonie Man at [facility] in 2 weeks. Needs to be seen ASAP after discharge. Thanks.   Ambulatory referral to Physical Medicine Rehab   Complete by: As directed    Moderate complexity follow up 1-2 weeks cerebellar vermis infarction       Follow-up Information    Lovorn, Jinny Blossom, MD Follow up.  Specialty: Physical Medicine and Rehabilitation Why: Office to call for appointment Contact information: [address] [place] [address] [phone]        Herminio Commons, MD Follow up.   Specialty: Cardiology Why: Call for appointment       Corliss Parish, MD Follow up.   Specialty: Nephrology Why: Call for appointment Contact information: [place] [address] [phone]        Garvin Fila, MD. Schedule an appointment as soon as possible for a visit in 2 day(s).   Specialties: Neurology, Radiology Contact information: [address] [place]  [address] [phone]               Signed: Cathlyn Parsons 09/30/2020, 5:19 AM

## 2020-09-28 NOTE — Progress Notes (Signed)
[facility] NEPHROLOGY PROGRESS NOTE  Assessment/ Plan: Pt is a 44 y.o. yo female ESRD on HD with a stroke now in patient rehab. Dialysis Orders:  [facility] TTS 3h 400/600 2K/2.5Ca EDW 58kg TDC Hep bolus 1800 EPO 6600 q HD Venofer 50 q Mon  #Stroke: MRI with acute stroke and chronic left parietal infarction.  On antiplatelet treatment and undergoing inpatient rehab.  Further management per primary team and neurologist.  # ESRD TTS via [facility], Needs outpatient fistulogram. She had dialysis yesterday with 2.5 L UF, tolerated well.  Plan for next HD tomorrow.  # Anemia due to ESRD: Monitor hemoglobin, continue aranesp.  Will monitor lab.   # Secondary hyperparathyroidism: Continue binders.  Monitor calcium and phosphorus level.  # HTN/volume: On metoprolol.  Volume status acceptable.  Subjective: Undergoing therapy in CIR.  Doing well.  Denies nausea, vomiting, chest pain, shortness of breath.  No new event. Objective Vital signs in last 24 hours: Vitals:   09/27/20 1622 09/27/20 1710 09/27/20 2100 09/28/20 0541  BP: (!) 151/104 (!) 152/99 135/82 117/89  Pulse:  68 75 72  Resp: 16 16 16 18   Temp: 98.3 F (36.8 C) 97.9 F (36.6 C) 98.3 F (36.8 C) 98.4 F (36.9 C)  TempSrc: Oral  Oral Oral  SpO2:  100% 99% 100%  Weight: 57.6 kg     Height:       Weight change:   Intake/Output Summary (Last 24 hours) at 09/28/2020 0925 Last data filed at 09/28/2020 0809 Gross per 24 hour  Intake 537 ml  Output 2500 ml  Net -1963 ml       Labs: Basic Metabolic Panel: Recent Labs  Lab 09/23/20 0154 09/24/20 1437 09/27/20 1337  NA 136 139 135  K 5.5* 3.5 5.2*  CL 100 101 97*  CO2 20* 26 22  GLUCOSE 155* 168* 297*  BUN 77* 42* 70*  CREATININE 8.83* 5.93* 9.31*  CALCIUM 8.0* 8.0* 8.6*  PHOS 7.9* 4.4 5.5*   Liver Function Tests: Recent Labs  Lab 09/23/20 0154 09/24/20 1437 09/27/20 1337  ALBUMIN 2.8* 2.6* 2.8*   No results for input(s): LIPASE, AMYLASE  in the last 168 hours. No results for input(s): AMMONIA in the last 168 hours. CBC: Recent Labs  Lab 09/21/20 1810 09/21/20 1810 09/22/20 1230 09/22/20 1230 09/23/20 0741 09/24/20 1436 09/27/20 1337  WBC 11.9*   < > 12.4*   < > 11.9* 9.0 11.4*  NEUTROABS  --   --  8.8*  --   --   --   --   HGB 11.8*   < > 12.3   < > 12.7 9.8* 10.5*  HCT 38.0   < > 39.2   < > 34.4* 31.3* 34.6*  MCV 91.1  --  90.7  --  91.2 93.2 94.3  PLT 106*   < > 99*   < > 95* 99* 158   < > = values in this interval not displayed.   Cardiac Enzymes: No results for input(s): CKTOTAL, CKMB, CKMBINDEX, TROPONINI in the last 168 hours. CBG: No results for input(s): GLUCAP in the last 168 hours.  Iron Studies: No results for input(s): IRON, TIBC, TRANSFERRIN, FERRITIN in the last 72 hours. Studies/Results: No results found.  Medications: Infusions:   Scheduled Medications: . aspirin EC  325 mg Oral Daily  . atorvastatin  40 mg Oral Daily  . calcium acetate (Phos Binder)  2,001 mg Oral TID WC  . Chlorhexidine Gluconate Cloth  6 each  Topical BID  . Chlorhexidine Gluconate Cloth  6 each Topical Q0600  . darbepoetin (ARANESP) injection - DIALYSIS  60 mcg Intravenous Q Tue-HD  . metoprolol tartrate  25 mg Oral BID  . multivitamin  1 tablet Oral QHS  . pantoprazole  40 mg Oral Daily  . PARoxetine  40 mg Oral Daily  . predniSONE  10 mg Oral Q breakfast  . traZODone  100 mg Oral QHS    have reviewed scheduled and prn medications.  Physical Exam: General:NAD, comfortable Heart:RRR, s1s2 nl Lungs:clear b/l, no crackle Abdomen:soft, Non-tender, non-distended Extremities:No edema Dialysis Access: Right IJ TDC  [name] Prasad Heinrich Fertig 09/28/2020,9:25 AM  LOS: 5 days  Pager: [phone]

## 2020-09-28 NOTE — Progress Notes (Signed)
Occupational Therapy Session Note  Patient Details  Name: [name] MRN: [id number] Date of Birth: 05/29/1976  Today's Date: 09/28/2020 OT Individual Time: 1000-1043 and 4709-2957 OT Individual Time Calculation (min): 43 min and 54 min   Short Term Goals: Week 1:  OT Short Term Goal 1 (Week 1): STG=LTG 2/2 ELOS  Skilled Therapeutic Interventions/Progress Updates:    Pt greeted at time of session reclined in bed resting but agreeable to OT session, wanting to wash up and change clothing. Supine to sit Indep, ambulated to toilet with supervision and performed all tasks in same manner no AD. Sink level bathing Indep for UB and Supervision for LB in standing but no LOB and pt standing for majority of bathing tasks. Dried off in same manner and put on powder without assist. Pt then ambulated around room to gather her clothes from dresser height with Supervision, dressing with Mod I seated for shirt and pants. Donned socks with Set up. Pt c/o fatigue, BP 136/104 and HR 85. Pt reclined in bed resting with alarm on, call bell in reach.   Session 2: Pt greeted at time of session semireclined in bed resting agreeable to OT session, no c/o pain throughout. Ambulated room > gym with [place] with supervision and no reports of fatigue. SCIFIT seated on level 3 initially but downgraded to level 2 d/t fatigue and frequent breaks.  Pt performing in 2-2:30 minute intervals for a total of 10 minutes switching directions half way through. BUE strengthening with 3# dowel and mirror for feedback for bicep curl 1x20, but with chest press c/o RUE discomfort at port site, switched to dynamic seated activity with using 3# for hitting small beach ball for core strength and reaction speed for 2x20 with one rest break. Ambulated back to room with [facility] Supervision, back in bed same manner alarm on call bell in reach.    Therapy Documentation Precautions:  Precautions Precautions: Fall Precaution Comments: orthostatic  BP Restrictions Weight Bearing Restrictions: No     Therapy/Group: Individual Therapy  [name] 09/28/2020, 12:39 PM

## 2020-09-29 ENCOUNTER — Inpatient Hospital Stay (HOSPITAL_COMMUNITY): Payer: Medicare Other | Admitting: Occupational Therapy

## 2020-09-29 ENCOUNTER — Inpatient Hospital Stay (HOSPITAL_COMMUNITY): Payer: Medicare Other

## 2020-09-29 ENCOUNTER — Inpatient Hospital Stay (HOSPITAL_COMMUNITY): Payer: Medicare Other | Admitting: Physical Therapy

## 2020-09-29 LAB — RENAL FUNCTION PANEL
Albumin: 2.9 g/dL — ABNORMAL LOW (ref 3.5–5.0)
Anion gap: 14 (ref 5–15)
BUN: 74 mg/dL — ABNORMAL HIGH (ref 6–20)
CO2: 22 mmol/L (ref 22–32)
Calcium: 8.5 mg/dL — ABNORMAL LOW (ref 8.9–10.3)
Chloride: 99 mmol/L (ref 98–111)
Creatinine, Ser: 8.8 mg/dL — ABNORMAL HIGH (ref 0.44–1.00)
GFR, Estimated: 5 mL/min — ABNORMAL LOW (ref >60)
Glucose, Bld: 281 mg/dL — ABNORMAL HIGH (ref 70–99)
Phosphorus: 5.5 mg/dL — ABNORMAL HIGH (ref 2.5–4.6)
Potassium: 5.1 mmol/L (ref 3.5–5.1)
Sodium: 135 mmol/L (ref 135–145)

## 2020-09-29 LAB — CBC
HCT: 32.3 % — ABNORMAL LOW (ref 36.0–46.0)
Hemoglobin: 10 g/dL — ABNORMAL LOW (ref 12.0–15.0)
MCH: 28.6 pg (ref 26.0–34.0)
MCHC: 31 g/dL (ref 30.0–36.0)
MCV: 92.3 fL (ref 80.0–100.0)
Platelets: 120 10*3/uL — ABNORMAL LOW (ref 150–400)
RBC: 3.5 MIL/uL — ABNORMAL LOW (ref 3.87–5.11)
RDW: 18.4 % — ABNORMAL HIGH (ref 11.5–15.5)
WBC: 10.2 10*3/uL (ref 4.0–10.5)
nRBC: 0.2 % (ref 0.0–0.2)

## 2020-09-29 MED ORDER — SODIUM CHLORIDE 0.9 % IV SOLN: 100.0000 mL | INTRAVENOUS | Status: DC | PRN

## 2020-09-29 MED ORDER — LIDOCAINE HCL (PF) 1 % IJ SOLN: 5.0000 mL | INTRAMUSCULAR | Status: DC | PRN

## 2020-09-29 MED ORDER — ACETAMINOPHEN 325 MG PO TABS: 650.0000 mg | ORAL_TABLET | Freq: Four times a day (QID) | ORAL | Status: DC | PRN

## 2020-09-29 MED ORDER — METOPROLOL TARTRATE 25 MG PO TABS: 25.0000 mg | ORAL_TABLET | Freq: Two times a day (BID) | ORAL | 0 refills | Status: DC

## 2020-09-29 MED ORDER — CALCIUM ACETATE (PHOS BINDER) 667 MG/5ML PO SOLN: 2001.0000 mg | Freq: Three times a day (TID) | ORAL | 0 refills | Status: AC

## 2020-09-29 MED ORDER — PAROXETINE HCL 40 MG PO TABS: 40.0000 mg | ORAL_TABLET | Freq: Every day | ORAL | 0 refills | Status: AC

## 2020-09-29 MED ORDER — NEPHRO-VITE 0.8 MG PO TABS: 1.0000 | ORAL_TABLET | Freq: Every day | ORAL | 0 refills | Status: AC

## 2020-09-29 MED ORDER — DARBEPOETIN ALFA 60 MCG/0.3ML IJ SOSY: 60.0000 ug | PREFILLED_SYRINGE | INTRAMUSCULAR | Status: AC

## 2020-09-29 MED ORDER — HEPARIN SODIUM (PORCINE) 1000 UNIT/ML IJ SOLN
INTRAMUSCULAR | Status: AC
  Filled 2020-09-29: qty 1

## 2020-09-29 MED ORDER — HEPARIN SODIUM (PORCINE) 1000 UNIT/ML DIALYSIS: 1000.0000 [IU] | INTRAMUSCULAR | Status: DC | PRN

## 2020-09-29 MED ORDER — PENTAFLUOROPROP-TETRAFLUOROETH EX AERO: 1.0000 "application " | INHALATION_SPRAY | CUTANEOUS | Status: DC | PRN

## 2020-09-29 MED ORDER — TRAZODONE HCL 100 MG PO TABS: 100.0000 mg | ORAL_TABLET | Freq: Every day | ORAL | 0 refills | Status: AC

## 2020-09-29 MED ORDER — ALTEPLASE 2 MG IJ SOLR: 2.0000 mg | Freq: Once | INTRAMUSCULAR | Status: DC | PRN

## 2020-09-29 MED ORDER — PANTOPRAZOLE SODIUM 40 MG PO TBEC: 40.0000 mg | DELAYED_RELEASE_TABLET | Freq: Every day | ORAL | 3 refills | Status: AC

## 2020-09-29 MED ORDER — LIDOCAINE-PRILOCAINE 2.5-2.5 % EX CREA
1.0000 "application " | TOPICAL_CREAM | CUTANEOUS | Status: DC | PRN
  Filled 2020-09-29: qty 5

## 2020-09-29 MED ORDER — ATORVASTATIN CALCIUM 40 MG PO TABS: 40.0000 mg | ORAL_TABLET | Freq: Every day | ORAL | 0 refills | Status: AC

## 2020-09-29 MED ORDER — PREDNISONE 10 MG PO TABS: 10.0000 mg | ORAL_TABLET | Freq: Every day | ORAL | 3 refills | Status: AC

## 2020-09-29 NOTE — Progress Notes (Signed)
Physical Therapy Session Note  Patient Details  Name: [name] MRN: [id number] Date of Birth: 11-19-1976  Today's Date: 09/29/2020 PT Individual Time: 0807-0901 PT Individual Time Calculation (min): 54 min   Short Term Goals: Week 1:  PT Short Term Goal 1 (Week 1): = LTGs  Skilled Therapeutic Interventions/Progress Updates:    Pt received supine in bed and agreeable to therapy session. Reports that she has learned so much through her physical therapy while in CIR and that she is ready to go home and "conquer" life where she left off. Supine<>sitting EOB independently. Donned socks, pants, and shoes independently. Sit<>stands independently during session. Gait ~77ft to/from bathroom, no AD, independently as pt reports that she is primarily using [facility] for community/longer distance mobility - pt demos a more slow, guarded gait with wider BOS. Performed toileting tasks independently. Gait ~10ft to sink, no AD, with 1x mild R LOB with pt able to recover without hands-on assistance; however, at this point pt reports she will likely use SPC at all times for balance - therapist encouraged this based on pt's slight instability. Gait training ~183ft to main therapy gym using [place] mod-I - initially demos wide BOS and excessive B LE hip external rotation - with cuing demos some improvement but still has R LE external rotation compared to L. Ascended/descended 12 steps using BHRs with variation of reciprocal or step-to pattern on ascent and step-to pattern on descent with close supervision for safety. Educated pt on fall risk safety and calling emergency response. Demonstrated proper floor transfer technique - pt performed but required mod assist to lift up into standing due to continued B LE muscle weakness. Gait training ~170ft back to room mod-I using SPC - continued cuing for improved gait mechanics. Pt left seated EOB with needs in reach and bed alarm on.    Therapy Documentation Precautions:   Precautions Precautions: Fall Precaution Comments: orthostatic BP Restrictions Weight Bearing Restrictions: No  Pain:   No reports of pain throughout session.  Therapy/Group: Individual Therapy  Tawana Scale , PT, DPT, CSRS  09/29/2020, 7:52 AM

## 2020-09-29 NOTE — Progress Notes (Signed)
Physical Therapy Discharge Summary  Patient Details  Name: [name] MRN: [id number] Date of Birth: 11-16-1976  Today's Date: 09/29/2020 PT Individual Time: 1100-1153 PT Individual Time Calculation (min): 53 min   Patient has met 6 of 7 long term goals due to improved activity tolerance, improved balance, improved postural control, increased strength and improved coordination.  Patient to discharge at an ambulatory level Modified Independent.  Pt's family did not attend family education training however pt to discharge home at a Mod I level overall and per OT, pt's niece will be available 24/7 if needed.   Reasons goals not met: Pt did not meet floor transfer goal of CGA due to generalized weakness, decreased endurance, and decreased balance/postural control.    Recommendation:  Patient will benefit from ongoing skilled PT services in home health setting to continue to advance safe functional mobility, address ongoing impairments in transfers, generalized strengthening, dynamic standing balance/coordinaiton, ambulation, endurance, and to minimize fall risk.  Equipment: No equipment provided  Reasons for discharge: treatment goals met  Patient/family agrees with progress made and goals achieved: Yes  Today's Interventions: Received pt supine in bed, pt agreeable to therapy, and denied any pain during session but reported fatigue from previous therapies. Session with emphasis on discharge planning, functional mobility/transfers, generalized strengthening, dynamic standing balance/coordination, ambulation, simulated car transfers, and improved activity tolerance. Pt performed bed mobility independently and donned shoes sitting EOB independently and pt ambulated 113f mod I with SPC to ortho gym and performed ambulatory simulated car transfer Mod I with SPC. Pt ambulated 174fon uneven surfaces (ramp and mulch) with L handrail and SPC mod I and 1 curb with SPC and close supervision. Pt  ambulated additional 2067fod I with SPC and performed BUE/BLE strengthening on Nustep at workload 3 for a total of 10 minutes for 472 steps for improved cardiovascular endurance with rest breaks every 1-2 minutes due to SOB and decreased endurance, however pt motivated to continue. Pt stood and picked up dry erase marker without AD and supervision with minor unsteadiness noted. Pt performed TUG with and without AD with average of 15.6 seconds and was educated on tests results and significance. Educated pt on importance of using SPC for long distance ambulation and in narrow and cluttered spaces; pt in agreement.  Trial 1: 18 seconds with SPC Trial 2: 16 seconds without AD Trial 3: 13 seconds without AD Attempted 5x sit<>stand, however pt only able to perform 1 rep due to fatigue and weakness. Pt ambulated to mat and therapist raised height of mat and pt able to perform sit<>stands 3x5 reps without UE support and supervision however mild LOB and BLE shakiness noted. Pt required cues for anterior weight shifting and to tuck feet underneath when standing. Pt reported 5/10 fatigue at end of session and required multiple rest breaks throughout session due to increased fatigue. Pt ambulated additional [id number] I with SPC back to room. Concluded session with pt sitting EOB bed, needs within reach, and bed alarm on.   PT Discharge Precautions/Restrictions Precautions Precautions: Fall Precaution Comments: orthostatic BP Restrictions Weight Bearing Restrictions: No Cognition Overall Cognitive Status: Within Functional Limits for tasks assessed Arousal/Alertness: Awake/alert Orientation Level: Oriented X4 Attention: Focused;Sustained Focused Attention: Appears intact Sustained Attention: Appears intact Memory: Appears intact Awareness: Appears intact Safety/Judgment: Appears intact Sensation  Sensation Light Touch: Appears Intact (reports long hx of tingling on bottom of bilateral feet that only  occurs the nights after dialysis & then is gone the next day)  Hot/Cold: Not tested Proprioception: Not tested Stereognosis: Not tested Coordination Gross Motor Movements are Fluid and Coordinated: Yes Heel Shin Test: slightly slower movement but symmetrical Motor  Motor Motor: Other (comment) Motor - Skilled Clinical Observations: generalized weakness and decreased endurance with activity  Mobility Bed Mobility Bed Mobility: Rolling Right;Rolling Left;Supine to Sit;Sit to Supine Rolling Right: Independent Rolling Left: Independent Supine to Sit: Independent Sit to Supine: Independent Transfers Transfers: Sit to Stand;Stand to Sit;Stand Pivot Transfers Sit to Stand: Independent with assistive device Stand to Sit: Independent with assistive device Stand Pivot Transfers: Independent with assistive device Transfer (Assistive device): Straight cane Locomotion  Gait Ambulation: Yes Gait Assistance: Independent with assistive device Gait Distance (Feet): 150 Feet Assistive device: Straight cane Gait Gait: Yes Gait Pattern: Impaired Gait Pattern: Decreased step length - right;Decreased step length - left;Decreased stride length;Decreased weight shift to left;Poor foot clearance - left;Poor foot clearance - right;Decreased trunk rotation;Step-through pattern Gait velocity: decreased Stairs / Additional Locomotion Stairs: Yes Stairs Assistance: Supervision/Verbal cueing Stair Management Technique: Two rails Number of Stairs: 12 Height of Stairs: 6 Ramp: Independent with assistive device (SPC) Curb: Supervision/Verbal cueing [facility]) Wheelchair Mobility Wheelchair Mobility: No  Trunk/Postural Assessment  Cervical Assessment Cervical Assessment: Within Functional Limits Thoracic Assessment Thoracic Assessment: Within Functional Limits Lumbar Assessment Lumbar Assessment: Within Functional Limits Postural Control Postural Control: Deficits on evaluation   Balance Balance Balance Assessed: Yes Standardized Balance Assessment Standardized Balance Assessment: Timed Up and Go Test Timed Up and Go Test TUG: Normal TUG (Trial 1: 18 seconds, Trial 2: 16 seconds, and Trial 3: 13 seconds) Normal TUG (seconds): 15.6 Static Sitting Balance Static Sitting - Level of Assistance: 7: Independent Dynamic Sitting Balance Dynamic Sitting - Level of Assistance: 7: Independent Static Standing Balance Static Standing - Level of Assistance: 6: Modified independent (Device/Increase time) (SPC) Dynamic Standing Balance Dynamic Standing - Level of Assistance: 6: Modified independent (Device/Increase time) [facility]) Extremity Assessment  RLE Assessment RLE Assessment: Exceptions to [facility] Active Range of Motion (AROM) Comments: WNL General Strength Comments: assessed in sitting RLE Strength Right Hip Flexion: 3+/5 Right Knee Flexion: 4-/5 Right Knee Extension: 4-/5 Right Ankle Dorsiflexion: 3+/5 Right Ankle Plantar Flexion: 4-/5 LLE Assessment LLE Assessment: Exceptions to [facility] Passive Range of Motion (PROM) Comments: of note: pt has swelling in L lower leg compared to R (reports this is due to lupus) Active Range of Motion (AROM) Comments: WNL except some slight decreased ROM in ankle with pt reporting hx of "a rod" placed in L foot General Strength Comments: assessed in sitting LLE Strength Left Hip Flexion: 4-/5 Left Knee Flexion: 4-/5 Left Knee Extension: 4-/5 Left Ankle Dorsiflexion: 3/5 Left Ankle Plantar Flexion: 3/5   Corinthian Kemler M Lyn Hollingshead PT, DPT  09/29/2020, 7:18 AM

## 2020-09-29 NOTE — Progress Notes (Signed)
Occupational Therapy Discharge Summary  Patient Details  Name: [name] MRN: [id number] Date of Birth: [birth year]  Today's Date: 09/29/2020 OT Individual Time:  5- 1025   56 min   Patient has met 11 of 11 long term goals due to improved activity tolerance, improved balance, postural control, ability to compensate for deficits, improved awareness and improved coordination.  Patient to discharge at overall Modified Independent level.  Patient's care partner is independent to provide the necessary physical assistance at discharge.  Pt is at Mod I level with SPC for ADLs including toileting, sink level bathing (unable to shower d/t port), and dressing including functional mobility and gathering items needed. Pt will go home to live independently but niece will be staying with her to assist with IADLs, transportation, etc.  Reasons goals not met: NA  Recommendation:  Patient will benefit from ongoing skilled OT services in home health setting to continue to advance functional skills in the area of BADL and iADL.  Equipment: No equipment provided  Recommended shower seat when able to shower, reviewed with pt where to purchase.   Reasons for discharge: treatment goals met and discharge from hospital  Patient/family agrees with progress made and goals achieved: Yes   Skilled Interventions: Pt greeted at time of session semireclined in bed agreeable to OT session, no c/o pain. Supine to sit EOB Indep prior to ambulating around room and bathroom to collect towels, washcloths, and clothing for session, all done with SPC. Sink level bathing including scooting chair up for herself with Mod I for UB and LB. UB/LB dress in same manner with pt remembering cues for safety including sitting to don/doff clothing when able and to take rest breaks when needed. After bathing/dressing, pt transported to apartment for simulated meal prep activity which she did without assist and good carryover of  stabilization techniques and safety tips for kitchen mobility. Transported back to room via wheelchair for time and energy conservation for later PT session, ambulated from doorway to bed and sit to supine Mod I. Alarm on, call bell in reach. Pt is aware of going home tomorrow and recommendation for family/friends to assist with IADLs such as groceries, driving, etc.  OT Discharge Precautions/Restrictions  Precautions Precautions: Fall Precaution Comments: orthostatic BP Restrictions Weight Bearing Restrictions: No Vital Signs Therapy Vitals Temp: 97.8 F (36.6 C) Temp Source: Oral Pulse Rate: (!) 59 Resp: 14 BP: (!) 143/80 Patient Position (if appropriate): Lying Oxygen Therapy SpO2: 100 % O2 Device: Room Air Pain Pain Assessment Pain Scale: 0-10 Pain Score: 0-No pain ADL ADL Eating: Independent Where Assessed-Eating: Bed level Grooming: Modified independent Where Assessed-Grooming: Standing at sink Upper Body Bathing: Modified independent Where Assessed-Upper Body Bathing: Standing at sink, Sitting at sink Lower Body Bathing: Modified independent Where Assessed-Lower Body Bathing: Sitting at sink, Standing at sink Upper Body Dressing: Modified independent (Device) Where Assessed-Upper Body Dressing: Chair Lower Body Dressing: Modified independent Where Assessed-Lower Body Dressing: Standing at sink, Sitting at sink Toileting: Modified independent Where Assessed-Toileting: Glass blower/designer: Modified Programmer, applications Method: Ambulating Tub/Shower Transfer: Unable to assess Vision Baseline Vision/History: Wears glasses Wears Glasses: At all times Visual Fields: No apparent deficits Perception  Perception: Within Functional Limits Praxis Praxis: Intact Cognition Overall Cognitive Status: Within Functional Limits for tasks assessed Arousal/Alertness: Awake/alert Orientation Level: Oriented X4 Focused Attention: Appears intact Sustained Attention:  Appears intact Memory: Appears intact Awareness: Appears intact Problem Solving: Appears intact Safety/Judgment: Appears intact Sensation Sensation Light Touch: Appears Intact Proprioception: Appears Intact  Stereognosis: Not tested Coordination Gross Motor Movements are Fluid and Coordinated: Yes Fine Motor Movements are Fluid and Coordinated: Yes Motor  Motor Motor: Within Functional Limits Motor - Skilled Clinical Observations: generalized weakness and decreased endurance with activity Mobility  Bed Mobility Supine to Sit: Independent Sit to Supine: Independent Transfers Sit to Stand: Independent with assistive device Stand to Sit: Independent with assistive device  Trunk/Postural Assessment  Cervical Assessment Cervical Assessment: Within Functional Limits Thoracic Assessment Thoracic Assessment: Within Functional Limits Lumbar Assessment Lumbar Assessment: Within Functional Limits Postural Control Postural Control: Deficits on evaluation (decreased postural control d/t generalized weakness but functional)  Balance Balance Balance Assessed: Yes Static Sitting Balance Static Sitting - Level of Assistance: 7: Independent Dynamic Sitting Balance Dynamic Sitting - Level of Assistance: 7: Independent Static Standing Balance Static Standing - Level of Assistance: 6: Modified independent (Device/Increase time) Dynamic Standing Balance Dynamic Standing - Level of Assistance: 6: Modified independent (Device/Increase time) Extremity/Trunk Assessment RUE Assessment RUE Assessment: Within Functional Limits Passive Range of Motion (PROM) Comments: WFL Active Range of Motion (AROM) Comments: WFL LUE Assessment LUE Assessment: Within Functional Limits Passive Range of Motion (PROM) Comments: WFL Active Range of Motion (AROM) Comments: WFL (limited at eval d/t IV site but now that is removed and ROM WFL)   [name] 09/29/2020, 5:34 PM

## 2020-09-29 NOTE — Discharge Instructions (Signed)
Inpatient Rehab Discharge Instructions  [name] Discharge date and time: No discharge date for patient encounter.   Activities/Precautions/ Functional Status: Activity: activity as tolerated Diet: renal diet Wound Care: Routine skin checks Functional status:  ___ No restrictions     ___ Walk up steps independently ___ 24/7 supervision/assistance   ___ Walk up steps with assistance ___ Intermittent supervision/assistance  ___ Bathe/dress independently ___ Walk with walker     _x__ Bathe/dress with assistance ___ Walk Independently    ___ Shower independently ___ Walk with assistance    ___ Shower with assistance ___ No alcohol     ___ Return to work/school ________ COMMUNITY REFERRALS UPON DISCHARGE:    Home Health:   PT     OT                    Agency: [facility] Phone: [phone]    Medical Equipment/Items Ordered: [employer]                                                 Agency/Supplier: [employer]  Special Instructions: No driving smoking or alcohol  Continue hemodialysis as directed STROKE/TIA DISCHARGE INSTRUCTIONS SMOKING Cigarette smoking nearly doubles your risk of having a stroke & is the single most alterable risk factor  If you smoke or have smoked in the last 12 months, you are advised to quit smoking for your health.  Most of the excess cardiovascular risk related to smoking disappears within a year of stopping.  Ask you doctor about anti-smoking medications  [place] Quit Line: 1-800-QUIT NOW  Free Smoking Cessation Classes [phone]  CHOLESTEROL Know your levels; limit fat & cholesterol in your diet  Lipid Panel     Component Value Date/Time   CHOL 209 (H) 09/18/2020 0201   TRIG 135 09/18/2020 0201   HDL 77 09/18/2020 0201   CHOLHDL 2.7 09/18/2020 0201   VLDL 27 09/18/2020 0201   LDLCALC 105 (H) 09/18/2020 0201      Many patients benefit from treatment even if their cholesterol is at goal.  Goal: Total Cholesterol (CHOL)  less than 160  Goal:  Triglycerides (TRIG) less than 150  Goal:  HDL greater than 40  Goal:  LDL (LDLCALC) less than 100   BLOOD PRESSURE American Stroke Association blood pressure target is less that 120/80 mm/Hg  Your discharge blood pressure is:  BP: (!) 131/91  Monitor your blood pressure  Limit your salt and alcohol intake  Many individuals will require more than one medication for high blood pressure  DIABETES (A1c is a blood sugar average for last 3 months) Goal HGBA1c is under 7% (HBGA1c is blood sugar average for last 3 months)  Diabetes: No known diagnosis of diabetes    Lab Results  Component Value Date   HGBA1C 7.2 (H) 09/18/2020     Your HGBA1c can be lowered with medications, healthy diet, and exercise.  Check your blood sugar as directed by your physician  Call your physician if you experience unexplained or low blood sugars.  PHYSICAL ACTIVITY/REHABILITATION Goal is 30 minutes at least 4 days per week  Activity: Increase activity slowly, Therapies: Physical Therapy: Home Health Return to work:   Activity decreases your risk of heart attack and stroke and makes your heart stronger.  It helps control your weight and blood  pressure; helps you relax and can improve your mood.  Participate in a regular exercise program.  Talk with your doctor about the best form of exercise for you (dancing, walking, swimming, cycling).  DIET/WEIGHT Goal is to maintain a healthy weight  Your discharge diet is:  Diet Order            Diet renal with fluid restriction Fluid restriction: 1200 mL Fluid; Room service appropriate? Yes; Fluid consistency: Thin  Diet effective now                 liquids Your height is:  Height: 5\' 4"  (162.6 cm) Your current weight is: Weight: 58.8 kg Your Body Mass Index (BMI) is:  BMI (Calculated): 22.24  Following the type of diet specifically designed for you will help prevent another stroke.  Your goal weight range is:    Your goal Body  Mass Index (BMI) is 19-24.  Healthy food habits can help reduce 3 risk factors for stroke:  High cholesterol, hypertension, and excess weight.  RESOURCES Stroke/Support Group:  Call [phone]   STROKE EDUCATION PROVIDED/REVIEWED AND GIVEN TO PATIENT Stroke warning signs and symptoms How to activate emergency medical system (call 911). Medications prescribed at discharge. Need for follow-up after discharge. Personal risk factors for stroke. Pneumonia vaccine given:  Flu vaccine given:  My questions have been answered, the writing is legible, and I understand these instructions.  I will adhere to these goals & educational materials that have been provided to me after my discharge from the hospital.      My questions have been answered and I understand these instructions. I will adhere to these goals and the provided educational materials after my discharge from the hospital.  Patient/Caregiver Signature _______________________________ Date __________  Clinician Signature _______________________________________ Date __________  Please bring this form and your medication list with you to all your follow-up doctor's appointments.  ===========================================================  MEDICATIONS TO STOP TAKING Clopidogrel (Not needed per Neurology) Diltiazem (Replaced by metoprolol) Hydralazine (No longer needed for blood pressure) Meclizine (Increased risk for dizziness and drowsiness)

## 2020-09-29 NOTE — Progress Notes (Signed)
[facility] NEPHROLOGY PROGRESS NOTE  Assessment/ Plan: Pt is a 44 y.o. yo female ESRD on HD with a stroke now in patient rehab. Dialysis Orders:  [facility] TTS 3h 400/600 2K/2.5Ca EDW 58kg TDC Hep bolus 1800 EPO 6600 q HD Venofer 50 q Mon  #Stroke: MRI with acute stroke and chronic left parietal infarction.  On antiplatelet treatment and undergoing inpatient rehab.  Further management per primary team and neurologist.  # ESRD TTS via [facility], Needs outpatient fistulogram. She had dialysis yesterday with 2.5 L UF, tolerated well.  Plan for HD today.  # Anemia due to ESRD: Monitor hemoglobin, continue aranesp.  Will monitor lab.   # Secondary hyperparathyroidism: Continue binders.  Monitor calcium and phosphorus level.  # HTN/volume: On metoprolol.  Volume status acceptable.  Subjective: Undergoing therapy in CIR.  Doing well.  No new event.  Denies nausea vomiting chest pain shortness of breath. Objective Vital signs in last 24 hours: Vitals:   09/27/20 2100 09/28/20 0541 09/28/20 1412 09/29/20 0444  BP: 135/82 117/89 128/88 119/85  Pulse: 75 72 68 65  Resp: 16 18 17 18   Temp: 98.3 F (36.8 C) 98.4 F (36.9 C) 97.7 F (36.5 C) 98.1 F (36.7 C)  TempSrc: Oral Oral  Oral  SpO2: 99% 100% 100% 100%  Weight:      Height:       Weight change:   Intake/Output Summary (Last 24 hours) at 09/29/2020 0936 Last data filed at 09/28/2020 2046 Gross per 24 hour  Intake 238 ml  Output --  Net 238 ml       Labs: Basic Metabolic Panel: Recent Labs  Lab 09/23/20 0154 09/24/20 1437 09/27/20 1337  NA 136 139 135  K 5.5* 3.5 5.2*  CL 100 101 97*  CO2 20* 26 22  GLUCOSE 155* 168* 297*  BUN 77* 42* 70*  CREATININE 8.83* 5.93* 9.31*  CALCIUM 8.0* 8.0* 8.6*  PHOS 7.9* 4.4 5.5*   Liver Function Tests: Recent Labs  Lab 09/23/20 0154 09/24/20 1437 09/27/20 1337  ALBUMIN 2.8* 2.6* 2.8*   No results for input(s): LIPASE, AMYLASE in the last 168 hours. No  results for input(s): AMMONIA in the last 168 hours. CBC: Recent Labs  Lab 09/22/20 1230 09/22/20 1230 09/23/20 0741 09/24/20 1436 09/27/20 1337  WBC 12.4*   < > 11.9* 9.0 11.4*  NEUTROABS 8.8*  --   --   --   --   HGB 12.3   < > 12.7 9.8* 10.5*  HCT 39.2   < > 34.4* 31.3* 34.6*  MCV 90.7  --  91.2 93.2 94.3  PLT 99*   < > 95* 99* 158   < > = values in this interval not displayed.   Cardiac Enzymes: No results for input(s): CKTOTAL, CKMB, CKMBINDEX, TROPONINI in the last 168 hours. CBG: No results for input(s): GLUCAP in the last 168 hours.  Iron Studies: No results for input(s): IRON, TIBC, TRANSFERRIN, FERRITIN in the last 72 hours. Studies/Results: No results found.  Medications: Infusions:   Scheduled Medications: . aspirin EC  325 mg Oral Daily  . atorvastatin  40 mg Oral Daily  . calcium acetate (Phos Binder)  2,001 mg Oral TID WC  . Chlorhexidine Gluconate Cloth  6 each Topical BID  . Chlorhexidine Gluconate Cloth  6 each Topical Q0600  . Chlorhexidine Gluconate Cloth  6 each Topical Q0600  . darbepoetin (ARANESP) injection - DIALYSIS  60 mcg Intravenous Q Tue-HD  . metoprolol tartrate  25 mg Oral BID  . multivitamin  1 tablet Oral QHS  . pantoprazole  40 mg Oral Daily  . PARoxetine  40 mg Oral Daily  . predniSONE  10 mg Oral Q breakfast  . traZODone  100 mg Oral QHS    have reviewed scheduled and prn medications.  Physical Exam: General:NAD, comfortable Heart:RRR, s1s2 nl Lungs:clear b/l, no crackle Abdomen:soft, Non-tender, non-distended Extremities:No edema Dialysis Access: Right IJ TDC  Khaleelah Yowell Prasad Tykwon Fera 09/29/2020,9:36 AM  LOS: 6 days  Pager: [phone]

## 2020-09-29 NOTE — Progress Notes (Signed)
[place] PHYSICAL MEDICINE & REHABILITATION PROGRESS NOTE   Subjective/Complaints:  Pt reports feeling great- K+ 5.2  Nephrology note doesn't mention-  WBC 11.4- has been going back and forth- is still lower than a few days ago- afebrile- no Sx's of illness per pt.   ROS:   Pt denies SOB, abd pain, CP, N/V/C/D, and vision changes    Objective:   No results found. Recent Labs    09/27/20 1337  WBC 11.4*  HGB 10.5*  HCT 34.6*  PLT 158   Recent Labs    09/27/20 1337  NA 135  K 5.2*  CL 97*  CO2 22  GLUCOSE 297*  BUN 70*  CREATININE 9.31*  CALCIUM 8.6*    Intake/Output Summary (Last 24 hours) at 09/29/2020 0843 Last data filed at 09/28/2020 2046 Gross per 24 hour  Intake 238 ml  Output --  Net 238 ml        Physical Exam: Vital Signs Blood pressure 119/85, pulse 65, temperature 98.1 F (36.7 C), temperature source Oral, resp. rate 18, height 5\' 4"  (1.626 m), weight 57.6 kg, SpO2 100 %.  General: sitting up in bed- finished breakfast, NAD HEENT: poor denitition Neck: Supple without JVD or lymphadenopathy Heart: RRR Chest: CTA B/L- no W/R/R- good air movement- HD catheter in R chest Abdomen: Soft, NT, ND, (+)BS  Extremities: nonpitting mild edema- some bruising L upper arm from previous IV/BP cuff Skin: Clean and intact without signs of breakdown; HDD catheter in R chest- looks OK- L upper arm IV looks OK Neuro: Ox3 . 4+/5 strength throughout Psych: pt appropriate    Assessment/Plan: 1. Functional deficits secondary to CVA which require 3+ hours per day of interdisciplinary therapy in a comprehensive inpatient rehab setting.  Physiatrist is providing close team supervision and 24 hour management of active medical problems listed below.  Physiatrist and rehab team continue to assess barriers to discharge/monitor patient progress toward functional and medical goals  Care Tool:  Bathing    Body parts bathed by patient: Right arm, Left arm,  Chest, Abdomen, Front perineal area, Buttocks, Right upper leg, Left upper leg, Face, Right lower leg, Left lower leg   Body parts bathed by helper: Right lower leg, Left lower leg     Bathing assist Assist Level: Supervision/Verbal cueing     Upper Body Dressing/Undressing Upper body dressing   What is the patient wearing?: Pull over shirt    Upper body assist Assist Level: Independent with assistive device    Lower Body Dressing/Undressing Lower body dressing      What is the patient wearing?: Pants, Underwear/pull up     Lower body assist Assist for lower body dressing: Independent with assitive device     Toileting Toileting    Toileting assist Assist for toileting: Supervision/Verbal cueing     Transfers Chair/bed transfer  Transfers assist     Chair/bed transfer assist level: Supervision/Verbal cueing     Locomotion Ambulation   Ambulation assist      Assist level: Supervision/Verbal cueing Assistive device: Cane-straight Max distance: 200'   Walk 10 feet activity   Assist     Assist level: Supervision/Verbal cueing Assistive device: No Device, Cane-straight   Walk 50 feet activity   Assist    Assist level: Supervision/Verbal cueing Assistive device: Cane-straight, No Device    Walk 150 feet activity   Assist    Assist level: Supervision/Verbal cueing Assistive device: No Device, Cane-straight    Walk 10 feet on uneven surface  activity   Assist     Assist level: Contact Guard/Touching assist Assistive device:  (no device)   Wheelchair     Assist Will patient use wheelchair at discharge?: No             Wheelchair 50 feet with 2 turns activity    Assist            Wheelchair 150 feet activity     Assist          Blood pressure 119/85, pulse 65, temperature 98.1 F (36.7 C), temperature source Oral, resp. rate 18, height 5\' 4"  (1.626 m), weight 57.6 kg, SpO2 100 %.    Medical Problem  List and Plan: 1.  Vertigo, dizziness and unstable gait secondary to acute cerebellar vermis small infarct.  Status post loop recorder 11/4  11/11- per pt, walking better with single point cane- should be ready by d/c.              -patient may shower             -ELOS/Goals: modI 10-12 days             -Continue CIR 2.  Antithrombotics: -DVT/anticoagulation: SCDs             -antiplatelet therapy: Aspirin 325 mg daily  11/10- stop plavix per neurology 3. Pain Management: Tylenol as needed. Well controlled  11/8- take for LUE pain- prn-con't  11/9- pain better- con't meds prn 4. Mood: Paxil 40 mg daily, trazodone 100 mg nightly. In positive mood.             -antipsychotic agents: N/A 5. Neuropsych: This patient is capable of making decisions on her own behalf. 6. Skin/Wound Care: Routine skin checks  11/8- remove LUE IV so BP cuff doesn't push on IV  11/11- better 7. Fluids/Electrolytes/Nutrition: Routine in and outs with follow-up chemistries 8.  End-stage renal disease.  Continue hemodialysis per renal services.  Outpatient fistulogram to be scheduled. T/Th/Sa schedule. 9.  Lupus.  Chronic prednisone 10 mg daily.  Follow-up outpatient rheumatology services 10.  Mitral stenosis.  Follow-up cardiology services. Loop recorder placed 11/4. 11.  Hypertension.  Lopressor 25 mg twice daily, Cardizem 120 mg nightly.  Monitor with increased mobility. Has had low Bps w/ lightheadedness 11/4 and medications were decreased as a result.  11/7: elevated diastolic- continue to monitor   11/8- DBP slightly elevated, but otherwise SBP controlled- con't regimen for now  11/9- DBP still elevated slightly 130/91- won't change meds as of yet.   11/10- BP controlled this AM- con't regimen  11/11- BP 119/85-  12.  Hyperlipidemia.  Lipitor 40mg  daily. 13. Hyperkalemia  11/10- pt's K+ 5.2- will d/w nephrology so they can change treatment/treat-   11/11- will leave to nephrology.    LOS: 6 days A FACE  TO FACE EVALUATION WAS PERFORMED  Jabier Deese 09/29/2020, 8:43 AM

## 2020-09-30 NOTE — Progress Notes (Signed)
Inpatient Rehabilitation Care Coordinator  Discharge Note  The overall goal for the admission was met for:   Discharge location: Yes, home  Length of Stay: Yes, 7 Days  Discharge activity level: Yes, ambulatory MOD I  Home/community participation: Yes  Services provided included: MD, RD, PT, OT, SLP, RN, CM, TR, Pharmacy, [place]: Private Insurance: [employer]  Follow-up services arranged: Home Health: [facility])  Comments (or additional information): [facility] [place]  PT and OT  Patient/Family verbalized understanding of follow-up arrangements: Yes  Individual responsible for coordination of the follow-up plan: self, [phone]  Confirmed correct DME delivered: Dyanne Iha 09/30/2020    Dyanne Iha

## 2020-09-30 NOTE — Progress Notes (Signed)
[facility] NEPHROLOGY PROGRESS NOTE  Assessment/ Plan: Pt is a 44 y.o. yo female ESRD on HD with a stroke now in patient rehab. Dialysis Orders:  [facility] TTS 3h 400/600 2K/2.5Ca EDW 58kg TDC Hep bolus 1800 EPO 6600 q HD Venofer 50 q Mon  #Stroke: MRI with acute stroke and chronic left parietal infarction.  On antiplatelet treatment and undergoing inpatient rehab.  Further management per primary team and neurologist.  # ESRD TTS via [facility], Needs outpatient fistulogram. She had dialysis yesterday with 2.5 L UF, tolerated well.  Plan for next HD tomorrow.  If she is being discharged then she can go to outpatient HD center.  # Anemia due to ESRD: Monitor hemoglobin, continue aranesp.  Will monitor lab.   # Secondary hyperparathyroidism: Continue binders.  Monitor calcium and phosphorus level.  # HTN/volume: On metoprolol.  Volume status acceptable.  Subjective: Undergoing therapy in CIR.  Doing well.  No new event.  Denies nausea vomiting chest pain shortness of breath.  Likely discharge home today per patient. Objective Vital signs in last 24 hours: Vitals:   09/29/20 1823 09/29/20 1838 09/29/20 2001 09/30/20 0444  BP: (!) 153/93 (!) 136/102 133/83 109/73  Pulse:  64 67 66  Resp:  14  18  Temp: 98 F (36.7 C) 98.6 F (37 C) 98.3 F (36.8 C) 98 F (36.7 C)  TempSrc: Oral Oral Oral Oral  SpO2: 99% 100% 100% 100%  Weight:      Height:       Weight change:   Intake/Output Summary (Last 24 hours) at 09/30/2020 0857 Last data filed at 09/30/2020 0754 Gross per 24 hour  Intake 118 ml  Output 2505 ml  Net -2387 ml       Labs: Basic Metabolic Panel: Recent Labs  Lab 09/24/20 1437 09/27/20 1337 09/29/20 1446  NA 139 135 135  K 3.5 5.2* 5.1  CL 101 97* 99  CO2 26 22 22   GLUCOSE 168* 297* 281*  BUN 42* 70* 74*  CREATININE 5.93* 9.31* 8.80*  CALCIUM 8.0* 8.6* 8.5*  PHOS 4.4 5.5* 5.5*   Liver Function Tests: Recent Labs  Lab 09/24/20 1437  09/27/20 1337 09/29/20 1446  ALBUMIN 2.6* 2.8* 2.9*   No results for input(s): LIPASE, AMYLASE in the last 168 hours. No results for input(s): AMMONIA in the last 168 hours. CBC: Recent Labs  Lab 09/24/20 1436 09/27/20 1337 09/29/20 1524  WBC 9.0 11.4* 10.2  HGB 9.8* 10.5* 10.0*  HCT 31.3* 34.6* 32.3*  MCV 93.2 94.3 92.3  PLT 99* 158 120*   Cardiac Enzymes: No results for input(s): CKTOTAL, CKMB, CKMBINDEX, TROPONINI in the last 168 hours. CBG: No results for input(s): GLUCAP in the last 168 hours.  Iron Studies: No results for input(s): IRON, TIBC, TRANSFERRIN, FERRITIN in the last 72 hours. Studies/Results: No results found.  Medications: Infusions:   Scheduled Medications: . aspirin EC  325 mg Oral Daily  . atorvastatin  40 mg Oral Daily  . calcium acetate (Phos Binder)  2,001 mg Oral TID WC  . Chlorhexidine Gluconate Cloth  6 each Topical BID  . Chlorhexidine Gluconate Cloth  6 each Topical Q0600  . Chlorhexidine Gluconate Cloth  6 each Topical Q0600  . darbepoetin (ARANESP) injection - DIALYSIS  60 mcg Intravenous Q Tue-HD  . metoprolol tartrate  25 mg Oral BID  . multivitamin  1 tablet Oral QHS  . pantoprazole  40 mg Oral Daily  . PARoxetine  40 mg Oral Daily  .  predniSONE  10 mg Oral Q breakfast  . traZODone  100 mg Oral QHS    have reviewed scheduled and prn medications.  Physical Exam: General:NAD, comfortable Heart:RRR, s1s2 nl Lungs: Clear b/l, no crackle Abdomen:soft, Non-tender, non-distended Extremities:No edema Dialysis Access: Right IJ TDC  [name] [name] 09/30/2020,8:57 AM  LOS: 7 days  Pager: [phone]

## 2020-09-30 NOTE — Progress Notes (Signed)
Patient discharged off of unit with all belongings. Discharge papers/instructions explained by physician assistant to family. Patient and family have no further questions at time of discharge. No complications noted at this time.  Harlow Basley L Iniya Matzek  

## 2020-09-30 NOTE — Progress Notes (Signed)
Renal Navigator confirmed with CIR CSW that patient is discharging today and faxed 09/30/20 progress notes to patient's outpatient HD clinic to provide continuity of care.   Alphonzo Cruise, [place] Renal Navigator [phone]

## 2020-09-30 NOTE — Plan of Care (Signed)
Problem: Consults Goal: RH STROKE PATIENT EDUCATION Description: See Patient Education module for education specifics . Patient will be educated about stroke with min assist Outcome: Completed/Met Goal: Nutrition Consult-if indicated Outcome: Completed/Met   Problem: RH SKIN INTEGRITY Goal: RH STG SKIN FREE OF INFECTION/BREAKDOWN Description: Pt will be free of skin infection or breakdown with min assist Outcome: Completed/Met Goal: RH STG MAINTAIN SKIN INTEGRITY WITH ASSISTANCE Description: STG Maintain Skin Integrity With min Assistance. Outcome: Completed/Met Goal: RH STG ABLE TO PERFORM INCISION/WOUND CARE W/ASSISTANCE Description: STG Able To Perform Incision/Wound Care With min Assistance. Outcome: Completed/Met   Problem: RH SAFETY Goal: RH STG ADHERE TO SAFETY PRECAUTIONS W/ASSISTANCE/DEVICE Description: STG Adhere to Safety Precautions With min Assistance/Device. Outcome: Completed/Met Goal: RH STG DECREASED RISK OF FALL WITH ASSISTANCE Description: STG Decreased Risk of Fall With min Assistance. Outcome: Completed/Met   Problem: RH COGNITION-NURSING Goal: RH STG USES MEMORY AIDS/STRATEGIES W/ASSIST TO PROBLEM SOLVE Description: STG Uses Memory Aids/Strategies With min Assistance to Problem Solve. Outcome: Completed/Met Goal: RH STG ANTICIPATES NEEDS/CALLS FOR ASSIST W/ASSIST/CUES Description: STG Anticipates Needs/Calls for Assist With min Assistance/Cues. Outcome: Completed/Met   Problem: RH PAIN MANAGEMENT Goal: RH STG PAIN MANAGED AT OR BELOW PT'S PAIN GOAL Description: Pain < 3/10. Outcome: Completed/Met   Problem: RH KNOWLEDGE DEFICIT Goal: RH STG INCREASE KNOWLEDGE OF HYPERTENSION Description: Pt will have increased knowledge about hypertension with min assist Outcome: Completed/Met Goal: RH STG INCREASE KNOWLEGDE OF HYPERLIPIDEMIA Description: Pt will have increase knowledge about hyperlipidemia with min assist Outcome: Completed/Met Goal: RH STG  INCREASE KNOWLEDGE OF STROKE PROPHYLAXIS Description: Patient will be knowledgeable on stroke prophylaxis with min assist Outcome: Completed/Met

## 2020-09-30 NOTE — Progress Notes (Signed)
[place] PHYSICAL MEDICINE & REHABILITATION PROGRESS NOTE   Subjective/Complaints:  K+ down to 5.1- in high normal range- d/w pt.  Also WBC down to 10.2- from 11.4- no Sx's of illness.   Ready for d/c today- explained will f/u with me to see how doing in ~ 1 month.    ROS:   Pt denies SOB, abd pain, CP, N/V/C/D, and vision changes   Objective:   No results found. Recent Labs    09/27/20 1337 09/29/20 1524  WBC 11.4* 10.2  HGB 10.5* 10.0*  HCT 34.6* 32.3*  PLT 158 120*   Recent Labs    09/27/20 1337 09/29/20 1446  NA 135 135  K 5.2* 5.1  CL 97* 99  CO2 22 22  GLUCOSE 297* 281*  BUN 70* 74*  CREATININE 9.31* 8.80*  CALCIUM 8.6* 8.5*    Intake/Output Summary (Last 24 hours) at 09/30/2020 0847 Last data filed at 09/30/2020 0754 Gross per 24 hour  Intake 118 ml  Output 2505 ml  Net -2387 ml        Physical Exam: Vital Signs Blood pressure 109/73, pulse 66, temperature 98 F (36.7 C), temperature source Oral, resp. rate 18, height 5\' 4"  (1.626 m), weight 58.5 kg, SpO2 100 %.  General: sitting up in bed- finished breakfast, NAD HEENT: - missing front teeth Neck: Supple without JVD or lymphadenopathy Heart: RRR Chest: CTA B/L- no W/R/R- good air movement- HD catheter in R chest Abdomen: Soft, NT, ND, (+)BS  Extremities: nonpitting mild edema- some bruising L upper arm from previous IV/BP cuff Skin: Clean and intact without signs of breakdown; HDD catheter in R chest- looks OK- L upper arm IV looks OK Neuro: Ox3 . 4+/5 strength throughout Psych: pt appropriate- bright affect    Assessment/Plan: 1. Functional deficits secondary to CVA which require 3+ hours per day of interdisciplinary therapy in a comprehensive inpatient rehab setting.  Physiatrist is providing close team supervision and 24 hour management of active medical problems listed below.  Physiatrist and rehab team continue to assess barriers to discharge/monitor patient progress toward  functional and medical goals  Care Tool:  Bathing    Body parts bathed by patient: Right arm, Left arm, Chest, Abdomen, Front perineal area, Buttocks, Right upper leg, Left upper leg, Face, Right lower leg, Left lower leg   Body parts bathed by helper: Right lower leg, Left lower leg     Bathing assist Assist Level: Independent with assistive device     Upper Body Dressing/Undressing Upper body dressing   What is the patient wearing?: Pull over shirt    Upper body assist Assist Level: Independent with assistive device    Lower Body Dressing/Undressing Lower body dressing      What is the patient wearing?: Pants, Underwear/pull up     Lower body assist Assist for lower body dressing: Independent with assitive device     Toileting Toileting    Toileting assist Assist for toileting: Independent with assistive device     Transfers Chair/bed transfer  Transfers assist     Chair/bed transfer assist level: (P) Independent with assistive device Chair/bed transfer assistive device: Cane   Locomotion Ambulation   Ambulation assist      Assist level: Independent with assistive device Assistive device: Cane-straight Max distance: 150   Walk 10 feet activity   Assist     Assist level: Independent with assistive device Assistive device: Cane-straight   Walk 50 feet activity   Assist    Assist  level: Independent with assistive device Assistive device: Cane-straight    Walk 150 feet activity   Assist    Assist level: Independent with assistive device Assistive device: Cane-straight    Walk 10 feet on uneven surface  activity   Assist     Assist level: Independent with assistive device Assistive device: Cane-straight, Other (comment) (R handrail)   Wheelchair     Assist Will patient use wheelchair at discharge?: No             Wheelchair 50 feet with 2 turns activity    Assist            Wheelchair 150 feet activity      Assist          Blood pressure 109/73, pulse 66, temperature 98 F (36.7 C), temperature source Oral, resp. rate 18, height 5\' 4"  (1.626 m), weight 58.5 kg, SpO2 100 %.    Medical Problem List and Plan: 1.  Vertigo, dizziness and unstable gait secondary to acute cerebellar vermis small infarct.  Status post loop recorder 11/4  11/11- per pt, walking better with single point cane- should be ready by d/c.              -patient may shower             -ELOS/Goals: modI 10-12 days             -Continue CIR 2.  Antithrombotics: -DVT/anticoagulation: SCDs             -antiplatelet therapy: Aspirin 325 mg daily  11/10- stop plavix per neurology 3. Pain Management: Tylenol as needed. Well controlled  11/8- take for LUE pain- prn-con't  11/9- pain better- con't meds prn 4. Mood: Paxil 40 mg daily, trazodone 100 mg nightly. In positive mood.             -antipsychotic agents: N/A 5. Neuropsych: This patient is capable of making decisions on her own behalf. 6. Skin/Wound Care: Routine skin checks  11/8- remove LUE IV so BP cuff doesn't push on IV  11/11- better 7. Fluids/Electrolytes/Nutrition: Routine in and outs with follow-up chemistries 8.  End-stage renal disease.  Continue hemodialysis per renal services.  Outpatient fistulogram to be scheduled. T/Th/Sa schedule. 9.  Lupus.  Chronic prednisone 10 mg daily.  Follow-up outpatient rheumatology services 10.  Mitral stenosis.  Follow-up cardiology services. Loop recorder placed 11/4. 11.  Hypertension.  Lopressor 25 mg twice daily, Cardizem 120 mg nightly.  Monitor with increased mobility. Has had low Bps w/ lightheadedness 11/4 and medications were decreased as a result.  11/7: elevated diastolic- continue to monitor   11/8- DBP slightly elevated, but otherwise SBP controlled- con't regimen for now  11/9- DBP still elevated slightly 130/91- won't change meds as of yet.   11/10- BP controlled this AM- con't regimen  11/11- BP  119/85-   11/12- BP a little soft this AM 109/85- will monitor 12.  Hyperlipidemia.  Lipitor 40mg  daily. 13. Hyperkalemia  11/10- pt's K+ 5.2- will d/w nephrology so they can change treatment/treat-   11/11- will leave to nephrology.   11/12- K+ 5.1 14. Dispo  11/12- d/c today   LOS: 7 days A FACE TO FACE EVALUATION WAS PERFORMED  [name] 09/30/2020, 8:47 AM

## 2020-10-04 ENCOUNTER — Telehealth: Payer: Self-pay | Admitting: *Deleted

## 2020-10-04 NOTE — Progress Notes (Deleted)
Cardiology Office Note Date:  10/04/2020  Patient ID:  [name], DOB 02/09/1976, MRN [id number] PCP:  Leeanne Rio, MD  Cardiologist: Dr. Burt Knack (Dr. Bronson Ing 2019) Electrophysiologist: Dr. Rayann Heman  ***refresh   Chief Complaint: *** ILR wound check  History of Present Illness: [name] is a 44 y.o. female with history of lupus, ESRD (T/Th/Sa), HTN, secondary hyperparathyroidism, depression, chronic tremor, extremity edema and anxiety  She was admitted to [facility] 09/17/20 w/ sx of room-spinning dizziness, N/V, and stool incontinence, found with Subcentimeter acute infarct involving the midline inferior vermis. Chronic left parietal infarct. Work up included Echo with EF 55 to 60%, severe left atrial dilatation, severe mitral stenosis LE venous Doppler 10/31 negative for DVT TEE with no endocarditis or thrombus,severe MS, TR, no PFO or ASD, late bubbles c/w pulm shunt or AVM Dr. Burt Knack was consulted for cardiology and recommendation on her MS. She was seen back in 2019 by Dr. Bronson Ing and had worn monitoring then without AF She underwent loop implant 09/22/20  *** site *** AF? *** needs cards f/u on MS  Device information MDT loop implanted 09/22/2020 for cryptogenic stroke   Past Medical History:  Diagnosis Date  . Allergy   . Anemia   . Anxiety   . Arthritis   . Avascular necrosis of bone (HCC)    left tibial talus due to chronic prednisone use  . Cerebellar stroke ([place])   . Depression   . Dyspnea    too much  fluid  . ESRD (end stage renal disease) on dialysis ([place])    tthsat [place]  . GERD (gastroesophageal reflux disease)   . Headache(784.0)   . History of high blood pressure   . Hyperlipemia   . Hypertension   . Lupus ([place])   . Pneumonia   . Secondary hyperparathyroidism (of renal origin)     Past Surgical History:  Procedure Laterality Date  . A/V FISTULAGRAM Left 12/02/2017   Procedure: A/V FISTULAGRAM - Left upper;   Surgeon: Angelia Mould, MD;  Location: [place] [facility];  Service: Cardiovascular;  Laterality: Left;  . AV FISTULA PLACEMENT Left   . AV FISTULA PLACEMENT Right 01/26/2014   Procedure: ARTERIOVENOUS (AV) FISTULA CREATION; ultrasound guided;  Surgeon: Conrad Greenfield, MD;  Location: [place];  Service: Vascular;  Laterality: Right;  . AV FISTULA PLACEMENT Right 04/25/2020   Procedure: RIGHT UPPER ARM  BASILIC VEIN ARTERIOVENOUS (AV) FISTULA;  Surgeon: [name], MD;  Location: [place];  Service: Vascular;  Laterality: Right;  . BASCILIC VEIN TRANSPOSITION Right 07/01/2020   Procedure: SECOND STAGE BASCILIC VEIN TRANSPOSITION RIGHT UPPER EXTREMITY;  Surgeon: [name], MD;  Location: [place];  Service: Vascular;  Laterality: Right;  . BUBBLE STUDY  09/20/2020   Procedure: BUBBLE STUDY;  Surgeon: Thayer Headings, MD;  Location: [facility] ENDOSCOPY;  Service: Cardiovascular;;  . ESOPHAGOGASTRODUODENOSCOPY N/A 03/17/2016   Procedure: ESOPHAGOGASTRODUODENOSCOPY (EGD);  Surgeon: Irene Shipper, MD;  Location: [facility] ENDOSCOPY;  Service: Endoscopy;  Laterality: N/A;  . FISTULOGRAM Left 09/24/2019   Procedure: FISTULOGRAM LEFT ARM;  Surgeon: [name], MD;  Location: [place];  Service: Vascular;  Laterality: Left;  . HEMATOMA EVACUATION Left 10/09/2013   Procedure: EVACUATION HEMATOMA;  Surgeon: Angelia Mould, MD;  Location: [place];  Service: Vascular;  Laterality: Left;  . HEMATOMA EVACUATION Left 09/24/2019   Procedure: EVACUATION HEMATOMA  LEFT ARM;  Surgeon: [name], MD;  Location: [place];  Service: Vascular;  Laterality: Left;  . INCISION AND DRAINAGE ABSCESS     gluteal  . INSERTION OF DIALYSIS CATHETER N/A 10/09/2013   Procedure: INSERTION OF DIALYSIS CATHETER; ULTRASOUND GUIDED;  Surgeon: Angelia Mould, MD;  Location: [place];  Service: Vascular;  Laterality: N/A;  . INSERTION OF DIALYSIS CATHETER Left 09/24/2019   Procedure: INSERTION OF TUNNELLED DIALYSIS  CATHETER - PALINDROME 15FR X 28CM;  Surgeon: [name], MD;  Location: [place];  Service: Vascular;  Laterality: Left;  . INSERTION OF DIALYSIS CATHETER Right 03/15/2020   Procedure: Insertion Of Dialysis Catheter;  Surgeon: Angelia Mould, MD;  Location: [place];  Service: Cardiovascular;  Laterality: Right;  . LOOP RECORDER INSERTION N/A 09/22/2020   Procedure: LOOP RECORDER INSERTION;  Surgeon: Thompson Grayer, MD;  Location: [place] [facility];  Service: Cardiovascular;  Laterality: N/A;  . PERIPHERAL VASCULAR BALLOON ANGIOPLASTY Left 12/02/2017   Procedure: PERIPHERAL VASCULAR BALLOON ANGIOPLASTY;  Surgeon: Angelia Mould, MD;  Location: [place] [facility];  Service: Cardiovascular;  Laterality: Left;  . REVISION OF ARTERIOVENOUS GORETEX GRAFT Left 04/03/2019   Procedure: REVISION OF ARTERIOVENOUS GORETEX GRAFT PSEUDOANEURYSM;  Surgeon: Angelia Mould, MD;  Location: [place];  Service: Vascular;  Laterality: Left;  . SHUNTOGRAM N/A 01/07/2014   Procedure: fistulogram with possibe venoplasty left upper arm avg;  Surgeon: Angelia Mould, MD;  Location: [facility] CATH LAB;  Service: Cardiovascular;  Laterality: N/A;  . TEE WITHOUT CARDIOVERSION N/A 09/20/2020   Procedure: TRANSESOPHAGEAL ECHOCARDIOGRAM (TEE);  Surgeon: Acie Fredrickson, Wonda Cheng, MD;  Location: [place];  Service: Cardiovascular;  Laterality: N/A;  . THROMBECTOMY AND REVISION OF ARTERIOVENTOUS (AV) GORETEX  GRAFT Left 03/15/2020   Procedure: ATTEMPTED THROMBECTOMY OF LEFT ARM ARTERIOVENTOUS (AV) GORETEX  GRAFT;  Surgeon: Angelia Mould, MD;  Location: [place];  Service: Cardiovascular;  Laterality: Left;  . tibiocalcaneal fusion  left Left   . ULTRASOUND GUIDANCE FOR VASCULAR ACCESS  09/24/2019   Procedure: Ultrasound Guidance For Vascular Access;  Surgeon: [name], MD;  Location: [place];  Service: Vascular;;    Current Outpatient Medications  Medication Sig Dispense Refill  . acetaminophen  (TYLENOL) 325 MG tablet Take 2 tablets (650 mg total) by mouth every 6 (six) hours as needed for mild pain (or Fever >/= 101).    Marland Kitchen aspirin EC 325 MG EC tablet Take 1 tablet (325 mg total) by mouth daily. 30 tablet 0  . atorvastatin (LIPITOR) 40 MG tablet Take 1 tablet (40 mg total) by mouth daily. 30 tablet 0  . b complex-vitamin c-folic acid (NEPHRO-VITE) 0.8 MG TABS tablet Take 1 tablet by mouth daily at 12 noon. 30 tablet 0  . calcium acetate, Phos Binder, (PHOSLYRA) 667 MG/5ML SOLN Take 15 mLs (2,001 mg total) by mouth 3 (three) times daily with meals. 1350 mL 0  . Carboxymethylcellul-Glycerin (LUBRICATING EYE DROPS OP) Place 1 drop into both eyes daily as needed (dry eyes).    . Darbepoetin Alfa (ARANESP) 60 MCG/0.3ML SOSY injection Inject 0.3 mLs (60 mcg total) into the vein every Tuesday with hemodialysis. 4.2 mL   . metoprolol tartrate (LOPRESSOR) 25 MG tablet Take 1 tablet (25 mg total) by mouth 2 (two) times daily. 60 tablet 0  . pantoprazole (PROTONIX) 40 MG tablet Take 1 tablet (40 mg total) by mouth daily. 30 tablet 3  . PARoxetine (PAXIL) 40 MG tablet Take 1 tablet (40 mg total) by mouth daily. 30 tablet 0  . predniSONE (DELTASONE) 10 MG  tablet Take 1 tablet (10 mg total) by mouth daily with breakfast. 90 tablet 3  . traZODone (DESYREL) 100 MG tablet Take 1 tablet (100 mg total) by mouth at bedtime. 30 tablet 0   No current facility-administered medications for this visit.    Allergies:   Amlodipine, Sulfa antibiotics, Vancomycin, and Venlafaxine   Social History:  The patient  reports that she has never smoked. She has never used smokeless tobacco. She reports that she does not drink alcohol and does not use drugs.   Family History:  The patient's family history includes Asthma in her sister; COPD in her maternal uncle; Heart attack (age of onset: 39) in her mother; Hypertension in her mother.  ROS:  Please see the history of present illness.    All other systems are reviewed  and otherwise negative.   PHYSICAL EXAM:  VS:  There were no vitals taken for this visit. BMI: There is no height or weight on file to calculate BMI. Well nourished, well developed, in no acute distress HEENT: normocephalic, atraumatic Neck: no JVD, carotid bruits or masses Cardiac:  *** RRR; no significant murmurs, no rubs, or gallops Lungs:  *** CTA b/l, no wheezing, rhonchi or rales Abd: soft, nontender MS: no deformity or *** atrophy Ext: *** no edema Skin: warm and dry, no rash Neuro:  No gross deficits appreciated Psych: euthymic mood, full affect  *** ILR site is stable, no tethering or discomfort   EKG: not done today Device interrogation done today and reviewed by myself:  ***  09/20/2020: TEE IMPRESSIONS  1. Left ventricular ejection fraction, by estimation, is 55 to 60%. The  left ventricle has normal function.  2. Right ventricular systolic function is normal. The right ventricular  size is normal.  3. No left atrial/left atrial appendage thrombus was detected.  4. The mean MS gradient is 14 mmhg.   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  This could be the cause of her stroke.   3-D images of the mitral valve were obtained. . The mitral valve is  abnormal. Moderate to severe mitral valve regurgitation. Moderate to  severe mitral stenosis. Moderate to severe mitral annular calcification.  5. Tricuspid valve regurgitation is moderate to severe.  6. The aortic valve is grossly normal. Aortic valve regurgitation is mild  to moderate.   Dr. Burt Knack was seeing her during her hospital stay and his progress note states:  "I have personally reviewed her TEE images and have also reviewed her images with some of our imaging specialist.  Her mitral valve changes with severe annular calcification are more consistent with valvular changes of end-stage renal disease.   This does not have the typical appearance of Libman-Sacks endocarditis that affects the leaflet tips.  Anticoagulation is not indicated at this time."   09/18/2020; TTE IMPRESSIONS  1. Left ventricular ejection fraction, by estimation, is 55 to 60%. The  left ventricle has normal function. The left ventricle has no regional  wall motion abnormalities. There is severe concentric left ventricular  hypertrophy. Diastolic function  indeterminant due to moderate-to-severe MAC.  2. Right ventricular systolic function is mildly reduced. The right  ventricular size is normal. There is moderately elevated pulmonary artery  systolic pressure.  3. Left atrial size was severely dilated.  4. Right atrial size was mildly dilated.  5. The mitral valve is degenerative. Moderate mitral valve regurgitation.  Severe  mitral stenosis with mean gradient 86mHg and MVA of 0.73cm2 by  continuity equation at HR 88bpm. Moderate to severe mitral annular  calcification.  6. Tricuspid valve regurgitation is severe.  7. The aortic valve is tricuspid. There is mild calcification of the  aortic valve. There is mild thickening of the aortic valve. Aortic valve  regurgitation is mild. Mild to moderate aortic valve  sclerosis/calcification is present, without any evidence  of aortic stenosis.  8. The inferior vena cava is normal in size with <50% respiratory  variability, suggesting right atrial pressure of 8 mmHg.   Comparison(s): Compared to prior study on 11/2017, there is now severe  calcific mitral stenosis and severe tricuspid regurgitation  Recent Labs: 09/18/2020: TSH 1.058 09/20/2020: ALT 40 09/29/2020: BUN 74; Creatinine, Ser 8.80; Hemoglobin 10.0; Platelets 120; Potassium 5.1; Sodium 135  09/18/2020: Cholesterol 209; HDL 77; LDL Cholesterol 105; Total CHOL/HDL Ratio 2.7; Triglycerides 135; VLDL 27   Estimated Creatinine Clearance: 7 mL/min (A) (by C-G formula based on SCr of 8.8 mg/dL (H)).   Wt  Readings from Last 3 Encounters:  09/29/20 128 lb 15.5 oz (58.5 kg)  09/23/20 128 lb 15.5 oz (58.5 kg)  09/09/20 133 lb 1.6 oz (60.4 kg)     Other studies reviewed: Additional studies/records reviewed today include: summarized above  ASSESSMENT AND PLAN:  1. Cryptogenic stroke     S/p loop implant      *** site     ***  2. Severe MS     Needs cardiology follow and management     ***  Disposition: F/u with ***  Current medicines are reviewed at length with the patient today.  The patient did not have any concerns regarding medicines.  SVenetia Night PA-C 10/04/2020 10:23 AM     [place] [phone](office)  [phone](fax)

## 2020-10-04 NOTE — Telephone Encounter (Signed)
Transition Care Management Unsuccessful Follow-up Telephone Call  Date of discharge and from where:  09/30/2020 [facility]  Attempts:  1st Attempt  Reason for unsuccessful TCM follow-up call:  Left voice message with appointment information and request that she call back to our office.

## 2020-10-05 ENCOUNTER — Encounter: Payer: Medicare Other | Admitting: Physician Assistant

## 2020-10-05 NOTE — Telephone Encounter (Signed)
Transition Care Management Unsuccessful Follow-up Telephone Call  Date of discharge and from where:  09/30/2020 [facility]  Attempts:  2nd Attempt  Reason for unsuccessful TCM follow-up call:  Left voice message

## 2020-10-12 ENCOUNTER — Encounter: Payer: Medicare Other | Attending: Registered Nurse | Admitting: Registered Nurse

## 2020-10-12 ENCOUNTER — Other Ambulatory Visit: Payer: Self-pay

## 2020-10-12 ENCOUNTER — Encounter: Payer: Self-pay | Admitting: Registered Nurse

## 2020-10-12 VITALS — BP 106/74 | HR 70 | Temp 98.4°F | Ht 64.0 in | Wt 129.0 lb

## 2020-10-12 DIAGNOSIS — N186 End stage renal disease: Secondary | ICD-10-CM | POA: Diagnosis not present

## 2020-10-12 DIAGNOSIS — Z992 Dependence on renal dialysis: Secondary | ICD-10-CM | POA: Diagnosis present

## 2020-10-12 DIAGNOSIS — I10 Essential (primary) hypertension: Secondary | ICD-10-CM | POA: Diagnosis not present

## 2020-10-12 DIAGNOSIS — E7849 Other hyperlipidemia: Secondary | ICD-10-CM | POA: Insufficient documentation

## 2020-10-12 DIAGNOSIS — Z8673 Personal history of transient ischemic attack (TIA), and cerebral infarction without residual deficits: Secondary | ICD-10-CM | POA: Insufficient documentation

## 2020-10-12 NOTE — Progress Notes (Signed)
Subjective:    Patient ID: [name], female    DOB: 1976-04-27, 44 y.o.   MRN: [id number]  HPI: [name] is a 44 y.o. female who is here for hospital follow up of her Cerebellar Cerebrovascular accident, ESRD, Essential Hypertension and Hyperlipidemia. She presented to [facility] on 09/17/2020 with a one day history of vertigo and nausea. Neurology Consulted.  MR Brain WO Contrast:  IMPRESSION: Motion degraded.  Subcentimeter acute infarct involving the midline inferior vermis.  Chronic left parietal infarct.  CT Angio Head W or WO Contrast:  IMPRESSION: Small acute midline cerebellar infarct as seen on MRI. No new intracranial abnormality.  No hemodynamically significant stenosis in the neck.  No proximal intracranial vessel occlusion. 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  Ms. Lobello was admitted to inpatient rehabilitation on 09/23/2020 and discharge home on 09/30/2020.She was receiving Home Health Therapy from [facility]. She states her pain is in her bilateral lower extremities, she describe her pain as achy pain. She rates her pain 4. She reports her appetite is fair.     Pain Inventory Average Pain 6 Pain Right Now 4 My pain is tingling and aching  LOCATION OF PAIN  head  BOWEL Number of stools per week: 2 Oral laxative use No  Type of laxative n/a  Enema or suppository use No  History of colostomy No  Incontinent No   BLADDER Dialysis In and out cath, frequency n/a  Able to self cath n/a Bladder incontinence No  Frequent urination No  Leakage with coughing No  Difficulty starting stream No  Incomplete bladder emptying No    Mobility walk with assistance use a cane ability to climb steps?  no do you drive?  yes  Function disabled: date disabled .  Neuro/Psych weakness tingling trouble walking anxiety  Prior Studies transitional care  Physicians involved in  your care transitional care   Family History  Problem Relation Age of Onset  . Asthma Sister   . Hypertension Mother   . Heart attack Mother 52       died at 44  . COPD Maternal Uncle        prostate  . Mental illness Neg Hx    Social History   Socioeconomic History  . Marital status: Single    Spouse name: none  . Number of children: 0  . Years of education: 72  . Highest education level: Some college, no degree  Occupational History  . Occupation: unemployed    Comment: disability  Tobacco Use  . Smoking status: Never Smoker  . Smokeless tobacco: Never Used  Vaping Use  . Vaping Use: Never used  Substance and Sexual Activity  . Alcohol use: No  . Drug use: No  . Sexual activity: Not Currently    Birth control/protection: None  Other Topics Concern  . Not on file  Social History Narrative   Lives alone   [place] to [place] 4 months ago to be closer to family   Previously lived in [place] , more active   Very close to sister [place]   Social Determinants of Health   Financial Resource Strain:   . Difficulty of Paying Living Expenses: Not on file  Food Insecurity:   . Worried About Charity fundraiser in the Last Year: Not on file  . Ran Out of Food in the Last Year: Not on file  Transportation Needs:   . Lack of Transportation (Medical): Not  on file  . Lack of Transportation (Non-Medical): Not on file  Physical Activity:   . Days of Exercise per Week: Not on file  . Minutes of Exercise per Session: Not on file  Stress:   . Feeling of Stress : Not on file  Social Connections:   . Frequency of Communication with Friends and Family: Not on file  . Frequency of Social Gatherings with Friends and Family: Not on file  . Attends Religious Services: Not on file  . Active Member of Clubs or Organizations: Not on file  . Attends Archivist Meetings: Not on file  . Marital Status: Not on file   Past Surgical History:  Procedure Laterality Date  . A/V  FISTULAGRAM Left 12/02/2017   Procedure: A/V FISTULAGRAM - Left upper;  Surgeon: Angelia Mould, MD;  Location: [place] [facility];  Service: Cardiovascular;  Laterality: Left;  . AV FISTULA PLACEMENT Left   . AV FISTULA PLACEMENT Right 01/26/2014   Procedure: ARTERIOVENOUS (AV) FISTULA CREATION; ultrasound guided;  Surgeon: Conrad Aquia Harbour, MD;  Location: [place];  Service: Vascular;  Laterality: Right;  . AV FISTULA PLACEMENT Right 04/25/2020   Procedure: RIGHT UPPER ARM  BASILIC VEIN ARTERIOVENOUS (AV) FISTULA;  Surgeon: [name], MD;  Location: [place];  Service: Vascular;  Laterality: Right;  . BASCILIC VEIN TRANSPOSITION Right 07/01/2020   Procedure: SECOND STAGE BASCILIC VEIN TRANSPOSITION RIGHT UPPER EXTREMITY;  Surgeon: [name], MD;  Location: [place];  Service: Vascular;  Laterality: Right;  . BUBBLE STUDY  09/20/2020   Procedure: BUBBLE STUDY;  Surgeon: Thayer Headings, MD;  Location: [facility] ENDOSCOPY;  Service: Cardiovascular;;  . ESOPHAGOGASTRODUODENOSCOPY N/A 03/17/2016   Procedure: ESOPHAGOGASTRODUODENOSCOPY (EGD);  Surgeon: Irene Shipper, MD;  Location: [facility] ENDOSCOPY;  Service: Endoscopy;  Laterality: N/A;  . FISTULOGRAM Left 09/24/2019   Procedure: FISTULOGRAM LEFT ARM;  Surgeon: [name], MD;  Location: [place];  Service: Vascular;  Laterality: Left;  . HEMATOMA EVACUATION Left 10/09/2013   Procedure: EVACUATION HEMATOMA;  Surgeon: Angelia Mould, MD;  Location: [place];  Service: Vascular;  Laterality: Left;  . HEMATOMA EVACUATION Left 09/24/2019   Procedure: EVACUATION HEMATOMA  LEFT ARM;  Surgeon: [name], MD;  Location: [place];  Service: Vascular;  Laterality: Left;  . INCISION AND DRAINAGE ABSCESS     gluteal  . INSERTION OF DIALYSIS CATHETER N/A 10/09/2013   Procedure: INSERTION OF DIALYSIS CATHETER; ULTRASOUND GUIDED;  Surgeon: Angelia Mould, MD;  Location: [place];  Service: Vascular;  Laterality: N/A;  . INSERTION OF  DIALYSIS CATHETER Left 09/24/2019   Procedure: INSERTION OF TUNNELLED DIALYSIS CATHETER - PALINDROME 15FR X 28CM;  Surgeon: [name], MD;  Location: [place];  Service: Vascular;  Laterality: Left;  . INSERTION OF DIALYSIS CATHETER Right 03/15/2020   Procedure: Insertion Of Dialysis Catheter;  Surgeon: Angelia Mould, MD;  Location: [place];  Service: Cardiovascular;  Laterality: Right;  . LOOP RECORDER INSERTION N/A 09/22/2020   Procedure: LOOP RECORDER INSERTION;  Surgeon: Thompson Grayer, MD;  Location: [place] [facility];  Service: Cardiovascular;  Laterality: N/A;  . PERIPHERAL VASCULAR BALLOON ANGIOPLASTY Left 12/02/2017   Procedure: PERIPHERAL VASCULAR BALLOON ANGIOPLASTY;  Surgeon: Angelia Mould, MD;  Location: [place] [facility];  Service: Cardiovascular;  Laterality: Left;  . REVISION OF ARTERIOVENOUS GORETEX GRAFT Left 04/03/2019   Procedure: REVISION OF ARTERIOVENOUS GORETEX GRAFT PSEUDOANEURYSM;  Surgeon: Angelia Mould, MD;  Location: [place];  Service: Vascular;  Laterality: Left;  . SHUNTOGRAM N/A 01/07/2014   Procedure: fistulogram with possibe venoplasty left upper arm avg;  Surgeon: Angelia Mould, MD;  Location: [facility] CATH LAB;  Service: Cardiovascular;  Laterality: N/A;  . TEE WITHOUT CARDIOVERSION N/A 09/20/2020   Procedure: TRANSESOPHAGEAL ECHOCARDIOGRAM (TEE);  Surgeon: Acie Fredrickson, Wonda Cheng, MD;  Location: [place];  Service: Cardiovascular;  Laterality: N/A;  . THROMBECTOMY AND REVISION OF ARTERIOVENTOUS (AV) GORETEX  GRAFT Left 03/15/2020   Procedure: ATTEMPTED THROMBECTOMY OF LEFT ARM ARTERIOVENTOUS (AV) GORETEX  GRAFT;  Surgeon: Angelia Mould, MD;  Location: [place];  Service: Cardiovascular;  Laterality: Left;  . tibiocalcaneal fusion  left Left   . ULTRASOUND GUIDANCE FOR VASCULAR ACCESS  09/24/2019   Procedure: Ultrasound Guidance For Vascular Access;  Surgeon: [name], MD;  Location: [place];  Service: Vascular;;   Past  Medical History:  Diagnosis Date  . Allergy   . Anemia   . Anxiety   . Arthritis   . Avascular necrosis of bone (HCC)    left tibial talus due to chronic prednisone use  . Cerebellar stroke ([place])   . Depression   . Dyspnea    too much  fluid  . ESRD (end stage renal disease) on dialysis ([place])    tthsat [place]  . GERD (gastroesophageal reflux disease)   . Headache(784.0)   . History of high blood pressure   . Hyperlipemia   . Hypertension   . Lupus ([place])   . Pneumonia   . Secondary hyperparathyroidism (of renal origin)    Ht 5\' 4"  (1.626 m)   Wt 129 lb (58.5 kg)   BMI 22.14 kg/m   Opioid Risk Score:   Fall Risk Score:  `1  Depression screen PHQ 2/9  Depression screen [facility] 2/9 12/09/2019 02/19/2018 11/20/2017 08/29/2017  Decreased Interest 0 0 1 0  Down, Depressed, Hopeless 0 0 1 1  PHQ - 2 Score 0 0 2 1  Altered sleeping - - 0 -  Tired, decreased energy - - 1 -  Change in appetite - - 0 -  Feeling bad or failure about yourself  - - 0 -  Trouble concentrating - - 1 -  Moving slowly or fidgety/restless - - 0 -  Suicidal thoughts - - 0 -  PHQ-9 Score - - 4 -    Review of Systems  Constitutional: Positive for appetite change and unexpected weight change.  HENT: Negative.   Eyes: Negative.   Respiratory: Positive for shortness of breath.   Cardiovascular: Negative.   Gastrointestinal: Positive for constipation.  Endocrine: Negative.   Genitourinary: Negative.        Dialysis  Musculoskeletal: Positive for myalgias.  Skin: Negative.   Allergic/Immunologic: Negative.   Neurological: Positive for weakness.       Tingling  Hematological: Negative.   Psychiatric/Behavioral: The patient is nervous/anxious.   All other systems reviewed and are negative.      Objective:   Physical Exam Vitals and nursing note reviewed.  Constitutional:      Appearance: Normal appearance.  Cardiovascular:     Rate and Rhythm: Normal rate and regular rhythm.     Pulses:  Normal pulses.     Heart sounds: Normal heart sounds.  Pulmonary:     Effort: Pulmonary effort is normal.     Breath sounds: Normal breath sounds.  Musculoskeletal:     Cervical back: Normal range of motion and neck supple.     Comments:  Normal Muscle Bulk and Muscle Testing Reveals:  Upper Extremities: Full ROM and Muscle Strength 5/5  Lower Extremities: Full ROM and Muscle Strength 5/5 Arising From chair slowly using cane for support Narrow Based Gait   Skin:    General: Skin is warm and dry.  Neurological:     Mental Status: She is alert and oriented to person, place, and time.  Psychiatric:        Mood and Affect: Mood normal.        Behavior: Behavior normal.           Assessment & Plan:  1. Cerebellar Cerebrovascular accident: [place] with [facility]. She has a scheduled appointment with Dr Leonie Man at [facility],   2. ESRD: Right Subclavian Dialysis Catheter Intact: Hemodialysis three days a week. Nephrology Following. 3. Essential Hypertension: Continue current medication regimen PCP and Nephrology Following.  4.  Hyperlipidemia: Continue current medication PCP following. Continue to Monitor.   F/U in 4- 6 weeks with  Dr Dagoberto Ligas

## 2020-10-18 ENCOUNTER — Telehealth: Payer: Self-pay

## 2020-10-18 NOTE — Telephone Encounter (Signed)
Spoke with Ebony Hail at [facility]. Pt out of inpatient rehab and ready to reschedule RUE fistulogram with Dr. Carlis Abbott. Scheduled pt for 10/27/20. Instructions reviewed and Joi verbalized understanding. Letter will be mailed to patient.

## 2020-10-24 ENCOUNTER — Other Ambulatory Visit (HOSPITAL_COMMUNITY)
Admission: RE | Admit: 2020-10-24 | Discharge: 2020-10-24 | Disposition: A | Discharge status: Home / Self Care | Payer: Medicare Other | Source: Ambulatory Visit | Attending: Vascular Surgery | Admitting: Vascular Surgery

## 2020-10-24 DIAGNOSIS — Z20822 Contact with and (suspected) exposure to covid-19: Secondary | ICD-10-CM | POA: Insufficient documentation

## 2020-10-24 DIAGNOSIS — Z01812 Encounter for preprocedural laboratory examination: Secondary | ICD-10-CM | POA: Insufficient documentation

## 2020-10-24 LAB — SARS CORONAVIRUS 2 (TAT 6-24 HRS): SARS Coronavirus 2: NEGATIVE

## 2020-10-27 ENCOUNTER — Other Ambulatory Visit: Payer: Self-pay

## 2020-10-27 ENCOUNTER — Encounter (HOSPITAL_COMMUNITY)
Admission: RE | Disposition: A | Discharge status: Home / Self Care | Payer: Self-pay | Source: Home / Self Care | Attending: Vascular Surgery

## 2020-10-27 ENCOUNTER — Ambulatory Visit (HOSPITAL_COMMUNITY)
Admission: RE | Admit: 2020-10-27 | Discharge: 2020-10-27 | Disposition: A | Discharge status: Home / Self Care | Payer: Medicare Other | Attending: Vascular Surgery | Admitting: Vascular Surgery

## 2020-10-27 DIAGNOSIS — Z882 Allergy status to sulfonamides status: Secondary | ICD-10-CM | POA: Diagnosis not present

## 2020-10-27 DIAGNOSIS — Z992 Dependence on renal dialysis: Secondary | ICD-10-CM | POA: Insufficient documentation

## 2020-10-27 DIAGNOSIS — Z888 Allergy status to other drugs, medicaments and biological substances status: Secondary | ICD-10-CM | POA: Insufficient documentation

## 2020-10-27 DIAGNOSIS — T82898A Other specified complication of vascular prosthetic devices, implants and grafts, initial encounter: Secondary | ICD-10-CM

## 2020-10-27 DIAGNOSIS — N186 End stage renal disease: Secondary | ICD-10-CM | POA: Diagnosis not present

## 2020-10-27 HISTORY — PX: A/V FISTULAGRAM: CATH118298

## 2020-10-27 HISTORY — PX: PERIPHERAL VASCULAR BALLOON ANGIOPLASTY: CATH118281

## 2020-10-27 LAB — POCT I-STAT, CHEM 8
BUN: 93 mg/dL — ABNORMAL HIGH (ref 6–20)
Calcium, Ion: 0.96 mmol/L — ABNORMAL LOW (ref 1.15–1.40)
Chloride: 102 mmol/L (ref 98–111)
Creatinine, Ser: 8.6 mg/dL — ABNORMAL HIGH (ref 0.44–1.00)
Glucose, Bld: 93 mg/dL (ref 70–99)
HCT: 39 % (ref 36.0–46.0)
Hemoglobin: 13.3 g/dL (ref 12.0–15.0)
Potassium: 7.6 mmol/L (ref 3.5–5.1)
Sodium: 135 mmol/L (ref 135–145)
TCO2: 26 mmol/L (ref 22–32)

## 2020-10-27 LAB — HCG, SERUM, QUALITATIVE: Preg, Serum: NEGATIVE

## 2020-10-27 SURGERY — A/V FISTULAGRAM
Anesthesia: LOCAL

## 2020-10-27 MED ORDER — ONDANSETRON HCL 4 MG/2ML IJ SOLN: 4.0000 mg | Freq: Four times a day (QID) | INTRAMUSCULAR | Status: DC | PRN

## 2020-10-27 MED ORDER — HEPARIN (PORCINE) IN NACL 1000-0.9 UT/500ML-% IV SOLN
INTRAVENOUS | Status: DC | PRN
  Administered 2020-10-27: 500 mL

## 2020-10-27 MED ORDER — SODIUM CHLORIDE 0.9% FLUSH: 3.0000 mL | Freq: Two times a day (BID) | INTRAVENOUS | Status: DC

## 2020-10-27 MED ORDER — HYDRALAZINE HCL 20 MG/ML IJ SOLN: 5.0000 mg | INTRAMUSCULAR | Status: DC | PRN

## 2020-10-27 MED ORDER — LIDOCAINE HCL (PF) 1 % IJ SOLN
INTRAMUSCULAR | Status: DC | PRN
  Administered 2020-10-27: 3 mL

## 2020-10-27 MED ORDER — IODIXANOL 320 MG/ML IV SOLN
INTRAVENOUS | Status: DC | PRN
  Administered 2020-10-27: 35 mL

## 2020-10-27 MED ORDER — SODIUM CHLORIDE 0.9% FLUSH: 3.0000 mL | INTRAVENOUS | Status: DC | PRN

## 2020-10-27 MED ORDER — LABETALOL HCL 5 MG/ML IV SOLN: 10.0000 mg | INTRAVENOUS | Status: DC | PRN

## 2020-10-27 MED ORDER — ACETAMINOPHEN 325 MG PO TABS: 650.0000 mg | ORAL_TABLET | ORAL | Status: DC | PRN

## 2020-10-27 MED ORDER — SODIUM CHLORIDE 0.9 % IV SOLN: 250.0000 mL | INTRAVENOUS | Status: DC | PRN

## 2020-10-27 MED ORDER — HEPARIN (PORCINE) IN NACL 1000-0.9 UT/500ML-% IV SOLN
INTRAVENOUS | Status: AC
  Filled 2020-10-27: qty 500

## 2020-10-27 MED ORDER — LIDOCAINE HCL (PF) 1 % IJ SOLN
INTRAMUSCULAR | Status: AC
  Filled 2020-10-27: qty 30

## 2020-10-27 SURGICAL SUPPLY — 17 items
BAG SNAP BAND KOVER 36X36 (MISCELLANEOUS) ×1 IMPLANT
BALLN MUSTANG 10X20X75 (BALLOONS) ×3
BALLN MUSTANG 6.0X100X75 (BALLOONS) ×3
BALLOON MUSTANG 10X20X75 (BALLOONS) IMPLANT
BALLOON MUSTANG 6.0X100X75 (BALLOONS) IMPLANT
CATH ANGIO 5F BER2 65CM (CATHETERS) ×1 IMPLANT
COVER DOME SNAP 22 D (MISCELLANEOUS) ×3 IMPLANT
KIT ENCORE 26 ADVANTAGE (KITS) ×1 IMPLANT
KIT MICROPUNCTURE NIT STIFF (SHEATH) ×1 IMPLANT
PROTECTION STATION PRESSURIZED (MISCELLANEOUS) ×3
SHEATH PINNACLE R/O II 6F 4CM (SHEATH) ×1 IMPLANT
SHEATH PROBE COVER 6X72 (BAG) ×4 IMPLANT
STATION PROTECTION PRESSURIZED (MISCELLANEOUS) ×2 IMPLANT
STOPCOCK MORSE 400PSI 3WAY (MISCELLANEOUS) ×3 IMPLANT
TRAY PV CATH (CUSTOM PROCEDURE TRAY) ×3 IMPLANT
TUBING CIL FLEX 10 FLL-RA (TUBING) ×3 IMPLANT
WIRE BENTSON .035X145CM (WIRE) ×1 IMPLANT

## 2020-10-27 NOTE — Op Note (Signed)
DATE OF SERVICE: 10/27/2020  PATIENT:  [name]  44 y.o. female  PRE-OPERATIVE DIAGNOSIS:  instage renal  POST-OPERATIVE DIAGNOSIS:  * No post-op diagnosis entered *  PROCEDURE:   US guided RUE AVF access Fistulagram Central venogram Angioplasty AVF (6x158mm Mustang) Angioplasty SVC / R Innominate vein (10x29mm Mustang)  SURGEON:  Surgeon(s) and Role:    * Chariti Havel, Yevonne Aline, MD - Primary  ASSISTANT: none  ANESTHESIA:   local  EBL: min  BLOOD ADMINISTERED:none  DRAINS: none   LOCAL MEDICATIONS USED:  LIDOCAINE   SPECIMEN:  none  COUNTS: confirmed correct.  TOURNIQUET:  * No tourniquets in log *  PATIENT DISPOSITION:  PACU - hemodynamically stable.   Delay start of Pharmacological VTE agent (>24hrs) due to surgical blood loss or risk of bleeding: no  INDICATION FOR PROCEDURE: [name] is a 44 y.o. female with poorly maturing RUE AVF. After careful discussion of risks, benefits, and alternatives the patient was offered fistulagram. The patient understood and wished to proceed.  OPERATIVE FINDINGS: Two areas of >90% stenosis upper arm fistula. 58F sheath nearly occlusive of proximal fistula. Unable to perform reflux angiogram.  DESCRIPTION OF PROCEDURE: After identification of the patient in the pre-operative holding area, the patient was transferred to the operating room. The patient was positioned supine on the operating room table. Anesthesia was induced. The right arm was prepped and draped in standard fashion. A surgical pause was performed confirming correct patient, procedure, and operative location.  Using ultrasound guidance the proximal aspect of the right upper extremity arteriovenous fistula was accessed with micropuncture technique. A 60F sheath was used to perform fistulagram. This revealed two areas of severe stenosis in mid AVF.   I elected to intervene. An 035 Britta Mccreedy wire was advanced centrally into the SVC. The 60F sheath was exchanged for  a 58F sheath. The two lesions were treated with a 6x161mm Mustang balloon. No residual stenosis was noted. Contrast did not drain into the central venous circulation with our 58F sheath in place. I angioplastied the right innominate vein to ensure no central stenosis and did not identify a waist with a 10x72mm ballon. Satisfied, I ended the case here. A figure of 8 suture was placed around the access. The sheath was removed and the suture secured. Hemostasis was excellent. A bandage was applied.  Upon completion of the case instrument and sharps counts were confirmed correct. The patient was transferred to the PACU in good condition. I was present for all portions of the procedure.  Yevonne Aline. Stanford Breed, MD [facility] Phone Number: [phone] 10/27/2020 10:25 AM

## 2020-10-27 NOTE — Progress Notes (Signed)
Patient was given discharge instructions. She verbalized understanding. 

## 2020-10-27 NOTE — H&P (Signed)
ASSESSMENT & PLAN:  44 y.o. female with poorly maturing RUE AVF Plan fistulagram to interrogate.  CHIEF COMPLAINT:   Poorly maturing fistula  HISTORY:  HISTORY OF PRESENT ILLNESS: [name] is a 44 y.o. female This is a 44 y.o. 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 Carlis Abbott.  Her original first stage was created on 04/25/2020 also by Dr. Carlis Abbott.  She was seen on 06/01/2020 and at that time, she was having numbness in her right 4th finger and it had improved some since the creation of hier fistula.  She previously had undergone an attempted thrombectomy of the left FA AVG but it did fail.    Pt returns today for follow up.  She states she is doing well.  The numbness in her right 4th finger has resolved and she is doing well.  She does not have any pain in her right hand.     The pt is on dialysis T/T/S at [facility] location with Dr. Theador Hawthorne.  She is currently using a right IJ TDC.   10/27/20: AVF maturing poorly. Dialyzing through RIJ catheter.  Past Medical History:  Diagnosis Date  . Allergy   . Anemia   . Anxiety   . Arthritis   . Avascular necrosis of bone (HCC)    left tibial talus due to chronic prednisone use  . Cerebellar stroke ([place])   . Depression   . Dyspnea    too much  fluid  . ESRD (end stage renal disease) on dialysis ([place])    tthsat [facility]  . GERD (gastroesophageal reflux disease)   . Headache(784.0)   . History of high blood pressure   . Hyperlipemia   . Hypertension   . Lupus ([place])   . Pneumonia   . Secondary hyperparathyroidism (of renal origin)     Past Surgical History:  Procedure Laterality Date  . A/V FISTULAGRAM Left 12/02/2017   Procedure: A/V FISTULAGRAM - Left upper;  Surgeon: Angelia Mould, MD;  Location: [place] [facility];  Service: Cardiovascular;  Laterality: Left;  . AV FISTULA PLACEMENT Left   . AV FISTULA PLACEMENT Right 01/26/2014   Procedure: ARTERIOVENOUS (AV) FISTULA CREATION; ultrasound guided;   Surgeon: Conrad Keaau, MD;  Location: [place];  Service: Vascular;  Laterality: Right;  . AV FISTULA PLACEMENT Right 04/25/2020   Procedure: RIGHT UPPER ARM  BASILIC VEIN ARTERIOVENOUS (AV) FISTULA;  Surgeon: [name], MD;  Location: [place];  Service: Vascular;  Laterality: Right;  . BASCILIC VEIN TRANSPOSITION Right 07/01/2020   Procedure: SECOND STAGE BASCILIC VEIN TRANSPOSITION RIGHT UPPER EXTREMITY;  Surgeon: [name], MD;  Location: [place];  Service: Vascular;  Laterality: Right;  . BUBBLE STUDY  09/20/2020   Procedure: BUBBLE STUDY;  Surgeon: Thayer Headings, MD;  Location: [facility] ENDOSCOPY;  Service: Cardiovascular;;  . ESOPHAGOGASTRODUODENOSCOPY N/A 03/17/2016   Procedure: ESOPHAGOGASTRODUODENOSCOPY (EGD);  Surgeon: Irene Shipper, MD;  Location: [facility] ENDOSCOPY;  Service: Endoscopy;  Laterality: N/A;  . FISTULOGRAM Left 09/24/2019   Procedure: FISTULOGRAM LEFT ARM;  Surgeon: [name], MD;  Location: [place];  Service: Vascular;  Laterality: Left;  . HEMATOMA EVACUATION Left 10/09/2013   Procedure: EVACUATION HEMATOMA;  Surgeon: Angelia Mould, MD;  Location: [place];  Service: Vascular;  Laterality: Left;  . HEMATOMA EVACUATION Left 09/24/2019   Procedure: EVACUATION HEMATOMA  LEFT ARM;  Surgeon: [name], MD;  Location: [place];  Service: Vascular;  Laterality: Left;  . INCISION AND DRAINAGE ABSCESS     gluteal  . INSERTION OF DIALYSIS CATHETER N/A 10/09/2013   Procedure: INSERTION OF DIALYSIS CATHETER; ULTRASOUND GUIDED;  Surgeon: Angelia Mould, MD;  Location: [place];  Service: Vascular;  Laterality: N/A;  . INSERTION OF DIALYSIS CATHETER Left 09/24/2019   Procedure: INSERTION OF TUNNELLED DIALYSIS CATHETER - PALINDROME 15FR X 28CM;  Surgeon: [name], MD;  Location: [place];  Service: Vascular;  Laterality: Left;  . INSERTION OF DIALYSIS CATHETER Right 03/15/2020   Procedure: Insertion Of Dialysis Catheter;  Surgeon: Angelia Mould, MD;  Location: [place];  Service: Cardiovascular;  Laterality: Right;  . LOOP RECORDER INSERTION N/A 09/22/2020   Procedure: LOOP RECORDER INSERTION;  Surgeon: Thompson Grayer, MD;  Location: [place] [facility];  Service: Cardiovascular;  Laterality: N/A;  . PERIPHERAL VASCULAR BALLOON ANGIOPLASTY Left 12/02/2017   Procedure: PERIPHERAL VASCULAR BALLOON ANGIOPLASTY;  Surgeon: Angelia Mould, MD;  Location: [place] [facility];  Service: Cardiovascular;  Laterality: Left;  . REVISION OF ARTERIOVENOUS GORETEX GRAFT Left 04/03/2019   Procedure: REVISION OF ARTERIOVENOUS GORETEX GRAFT PSEUDOANEURYSM;  Surgeon: Angelia Mould, MD;  Location: [place];  Service: Vascular;  Laterality: Left;  . SHUNTOGRAM N/A 01/07/2014   Procedure: fistulogram with possibe venoplasty left upper arm avg;  Surgeon: Angelia Mould, MD;  Location: [facility] CATH LAB;  Service: Cardiovascular;  Laterality: N/A;  . TEE WITHOUT CARDIOVERSION N/A 09/20/2020   Procedure: TRANSESOPHAGEAL ECHOCARDIOGRAM (TEE);  Surgeon: Acie Fredrickson, Wonda Cheng, MD;  Location: [place];  Service: Cardiovascular;  Laterality: N/A;  . THROMBECTOMY AND REVISION OF ARTERIOVENTOUS (AV) GORETEX  GRAFT Left 03/15/2020   Procedure: ATTEMPTED THROMBECTOMY OF LEFT ARM ARTERIOVENTOUS (AV) GORETEX  GRAFT;  Surgeon: Angelia Mould, MD;  Location: [place];  Service: Cardiovascular;  Laterality: Left;  . tibiocalcaneal fusion  left Left   . ULTRASOUND GUIDANCE FOR VASCULAR ACCESS  09/24/2019   Procedure: Ultrasound Guidance For Vascular Access;  Surgeon: [name], MD;  Location[facility] OR;  Service: Vascular;;    Family History  Problem Relation Age of Onset  . Asthma Sister   . Hypertension Mother   . Heart attack Mother 54       died at 19  . COPD Maternal Uncle        prostate  . Mental illness Neg Hx     Social History   Socioeconomic History  . Marital status: Single    Spouse name: none  . Number of children: 0  . Years of  education: 59  . Highest education level: Some college, no degree  Occupational History  . Occupation: unemployed    Comment: disability  Tobacco Use  . Smoking status: Never Smoker  . Smokeless tobacco: Never Used  Vaping Use  . Vaping Use: Never used  Substance and Sexual Activity  . Alcohol use: No  . Drug use: No  . Sexual activity: Not Currently    Birth control/protection: None  Other Topics Concern  . Not on file  Social History Narrative   Lives alone   [place] to [place] 4 months ago to be closer to family   Previously lived in [place] , more active   Very close to sister [place]   Social Determinants of Health   Financial Resource Strain: Not on file  Food Insecurity: Not on file  Transportation Needs: Not on file  Physical Activity: Not on file  Stress: Not on file  Social  Connections: Not on file  Intimate Partner Violence: Not on file    Allergies  Allergen Reactions  . Amlodipine Hives and Swelling  . Sulfa Antibiotics Hives and Itching  . Vancomycin Hives and Itching  . Venlafaxine Other (See Comments)    Emotional    No current facility-administered medications for this encounter.    REVIEW OF SYSTEMS:  [X]  denotes positive finding, [ ]  denotes negative finding Cardiac  Comments:  Chest pain or chest pressure:    Shortness of breath upon exertion:    Short of breath when lying flat:    Irregular heart rhythm:        Vascular    Pain in calf, thigh, or hip brought on by ambulation:    Pain in feet at night that wakes you up from your sleep:     Blood clot in your veins:    Leg swelling:         Pulmonary    Oxygen at home:    Productive cough:     Wheezing:         Neurologic    Sudden weakness in arms or legs:     Sudden numbness in arms or legs:     Sudden onset of difficulty speaking or slurred speech:    Temporary loss of vision in one eye:     Problems with dizziness:         Gastrointestinal    Blood in stool:     Vomited  blood:         Genitourinary    Burning when urinating:     Blood in urine:        Psychiatric    Major depression:         Hematologic    Bleeding problems:    Problems with blood clotting too easily:        Skin    Rashes or ulcers:        Constitutional    Fever or chills:     PHYSICAL EXAM:   Vitals:   10/27/20 0752  BP: (!) 146/108  Pulse: 69  Resp: 18  Temp: 97.6 F (36.4 C)  TempSrc: Oral  SpO2: 100%  Weight: 56.7 kg  Height: 5\' 4"  (1.626 m)   RUE AVF pulsatile with weak thrill  DATA REVIEW:    Most recent CBC CBC Latest Ref Rng & Units 09/29/2020 09/27/2020 09/24/2020  WBC 4.0 - 10.5 K/uL 10.2 11.4(H) 9.0  Hemoglobin 12.0 - 15.0 g/dL 10.0(L) 10.5(L) 9.8(L)  Hematocrit 36.0 - 46.0 % 32.3(L) 34.6(L) 31.3(L)  Platelets 150 - 400 K/uL 120(L) 158 99(L)     Most recent CMP CMP Latest Ref Rng & Units 09/29/2020 09/27/2020 09/24/2020  Glucose 70 - 99 mg/dL 281(H) 297(H) 168(H)  BUN 6 - 20 mg/dL 74(H) 70(H) 42(H)  Creatinine 0.44 - 1.00 mg/dL 8.80(H) 9.31(H) 5.93(H)  Sodium 135 - 145 mmol/L 135 135 139  Potassium 3.5 - 5.1 mmol/L 5.1 5.2(H) 3.5  Chloride 98 - 111 mmol/L 99 97(L) 101  CO2 22 - 32 mmol/L 22 22 26   Calcium 8.9 - 10.3 mg/dL 8.5(L) 8.6(L) 8.0(L)  Total Protein 6.5 - 8.1 g/dL - - -  Total Bilirubin 0.3 - 1.2 mg/dL - - -  Alkaline Phos 38 - 126 U/L - - -  AST 15 - 41 U/L - - -  ALT 0 - 44 U/L - - -    Renal function CrCl cannot be calculated (Patient's most recent  lab result is older than the maximum 21 days allowed.).  Hgb A1c MFr Bld (%)  Date Value  09/18/2020 7.2 (H)    LDL Cholesterol  Date Value Ref Range Status  09/18/2020 105 (H) 0 - 99 mg/dL Final    Comment:           Total Cholesterol/HDL:CHD Risk Coronary Heart Disease Risk Table                     Men   Women  1/2 Average Risk   3.4   3.3  Average Risk       5.0   4.4  2 X Average Risk   9.6   7.1  3 X Average Risk  23.4   11.0        Use the calculated Patient  Ratio above and the CHD Risk Table to determine the patient's CHD Risk.        ATP III CLASSIFICATION (LDL):  <100     mg/dL   Optimal  100-129  mg/dL   Near or Above                    Optimal  130-159  mg/dL   Borderline  160-189  mg/dL   High  >190     mg/dL   Very High Performed at [facility] [address], [place], [place] [address]      Yevonne Aline. Stanford Breed, MD [facility] Phone Number: [phone] 10/27/2020 8:43 AM

## 2020-10-27 NOTE — Discharge Instructions (Signed)
AV Fistula Placement, Care After This sheet gives you information about how to care for yourself after your procedure. Your health care provider may also give you more specific instructions. If you have problems or questions, contact your health care provider. What can I expect after the procedure? After the procedure, it is common to:  Feel sore.  Feel a vibration (thrill) over the fistula. Follow these instructions at home: Incision care      Follow instructions from your health care provider about how to take care of your incision. Make sure you: ? Wash your hands with soap and water before and after you change your bandage (dressing). If soap and water are not available, use hand sanitizer. ? Change your dressing as told by your health care provider. ? Leave stitches (sutures), skin glue, or adhesive strips in place. These skin closures may need to stay in place for 2 weeks or longer. If adhesive strip edges start to loosen and curl up, you may trim the loose edges. Do not remove adhesive strips completely unless your health care provider tells you to do that. Fistula care  Check your fistula site every day to make sure the thrill feels the same.  Check your fistula site every day for signs of infection. Check for: ? Redness, swelling, or pain. ? Fluid or blood. ? Warmth. ? Pus or a bad smell.  Raise (elevate) the affected area above the level of your heart while you are sitting or lying down.  Do not lift anything that is heavier than 10 lb (4.5 kg), or the limit that you are told, until your health care provider says that it is safe.  Do not lie down on your fistula arm.  Do not let anyone draw blood or take a blood pressure reading on your fistula arm. This is important.  Do not wear tight jewelry or clothing over your fistula arm. Bathing  Do not take baths, swim, or use a hot tub until your health care provider approves. Ask your health care provider if you may take  showers. You may only be allowed to take sponge baths.  Keep the area around your incision clean and dry. Medicines  Take over-the-counter and prescription medicines only as told by your health care provider.  Ask your health care provider if any medicine prescribed to you can cause constipation. You may need to take steps to prevent or treat constipation, such as: ? Drink enough fluid to keep your urine pale yellow. ? Take over-the-counter or prescription medicines. ? Eat foods that are high in fiber, such as beans, whole grains, and fresh fruits and vegetables. ? Limit foods that are high in fat and processed sugars, such as fried or sweet foods. General instructions  Rest at home for a day or two.  Return to your normal activities as told by your health care provider. Ask your health care provider what activities are safe for you.  Keep all follow-up visits as told by your health care provider. This is important. Contact a health care provider if:  You have redness, swelling, or pain around your fistula site.  Your fistula site feels warm to the touch.  You have pus or a bad smell coming from your fistula site.  You have a fever.  You have numbness or coldness at your fistula site.  You feel a decrease or a change in the thrill. Get help right away if you:  Are bleeding from your fistula site.  Have chest   pain.  Have trouble breathing. Summary  Follow instructions from your health care provider about how to take care of your incision.  Do not let anyone draw blood or take a blood pressure reading on your fistula arm. This is important.  Return to your normal activities as told by your health care provider. Ask your health care provider what activities are safe for you.  Contact a health care provider if you have a change in the thrill or have any signs of infection at your fistula site.  Keep all follow-up visits as told by your health care provider. This is  important. This information is not intended to replace advice given to you by your health care provider. Make sure you discuss any questions you have with your health care provider. Document Revised: 04/24/2019 Document Reviewed: 05/12/2018 Elsevier Patient Education  2020 [employer]  

## 2020-10-28 ENCOUNTER — Encounter (HOSPITAL_COMMUNITY): Payer: Self-pay | Admitting: Vascular Surgery

## 2020-10-31 ENCOUNTER — Encounter (HOSPITAL_COMMUNITY): Payer: Self-pay | Admitting: Vascular Surgery

## 2020-11-03 NOTE — Progress Notes (Signed)
Cardiology Office Note  Date: 11/03/2020   ID: [name], DOB Nov 12, 1976, MRN [id number]  PCP:  Leeanne Rio, MD  Cardiologist:  No primary care provider on file. Electrophysiologist:  None   Chief Complaint: Hospital follow-up.  History of HTN, hypertensive heart disease, palpitations, mitral valve disease, CVA, HLD, lupus, ESRD.  Status post implantable loop recorder by Dr. Rayann Heman  History of Present Illness: [name] is a 44 y.o. female with a history of hypertension, hypertensive heart disease, palpitations, mitral valve disease, CVA, HLD, lupus, ESRD.  Post implantable loop recorder by Dr. Rayann Heman.  Last seen by Dr. Bronson Ing via telemedicine 09/02/2019.  She denies any chest pain, shortness of breath, palpitations.  Blood pressure systolic was in the 258 range.  She was feeling very well.  Recent admission on 09/23/2020 for CVA.  She presented to [facility] urgent care with dizziness and treated for vertigo.  She came to [facility] for further evaluation.  MRI of the brain which showed subcentimeter acute infarct involving midline inferior vermis.  Chronic left parietal infarction.  CT of head and neck no hemodynamically significant stenosis in the neck.  Echocardiogram with severe left atrial dilation and mitral stenosis in setting of significant annular calcification.  TEE showed normal LV function no AAS or thrombi.  She was maintained on aspirin.  She had a loop recorder implanted on 09/22/2020. No DAPT therapy was started due to thrombocytopenia.  Past Medical History:  Diagnosis Date  . Allergy   . Anemia   . Anxiety   . Arthritis   . Avascular necrosis of bone (HCC)    left tibial talus due to chronic prednisone use  . Cerebellar stroke ([place])   . Depression   . Dyspnea    too much  fluid  . ESRD (end stage renal disease) on dialysis ([place])    tthsat [place]  . GERD (gastroesophageal reflux disease)   . Headache(784.0)   . History of high blood  pressure   . Hyperlipemia   . Hypertension   . Lupus ([place])   . Pneumonia   . Secondary hyperparathyroidism (of renal origin)     Past Surgical History:  Procedure Laterality Date  . A/V FISTULAGRAM Left 12/02/2017   Procedure: A/V FISTULAGRAM - Left upper;  Surgeon: Angelia Mould, MD;  Location: [place] [facility];  Service: Cardiovascular;  Laterality: Left;  . A/V FISTULAGRAM N/A 10/27/2020   Procedure: A/V FISTULAGRAM - Right Upper;  Surgeon: Cherre Robins, MD;  Location: [place] [facility];  Service: Cardiovascular;  Laterality: N/A;  . AV FISTULA PLACEMENT Left   . AV FISTULA PLACEMENT Right 01/26/2014   Procedure: ARTERIOVENOUS (AV) FISTULA CREATION; ultrasound guided;  Surgeon: Conrad Alma Center, MD;  Location: [place];  Service: Vascular;  Laterality: Right;  . AV FISTULA PLACEMENT Right 04/25/2020   Procedure: RIGHT UPPER ARM  BASILIC VEIN ARTERIOVENOUS (AV) FISTULA;  Surgeon: [name], MD;  Location: [place];  Service: Vascular;  Laterality: Right;  . BASCILIC VEIN TRANSPOSITION Right 07/01/2020   Procedure: SECOND STAGE BASCILIC VEIN TRANSPOSITION RIGHT UPPER EXTREMITY;  Surgeon: [name], MD;  Location: [place];  Service: Vascular;  Laterality: Right;  . BUBBLE STUDY  09/20/2020   Procedure: BUBBLE STUDY;  Surgeon: Thayer Headings, MD;  Location: [facility] ENDOSCOPY;  Service: Cardiovascular;;  . ESOPHAGOGASTRODUODENOSCOPY N/A 03/17/2016   Procedure: ESOPHAGOGASTRODUODENOSCOPY (EGD);  Surgeon: Irene Shipper, MD;  Location: [facility] ENDOSCOPY;  Service: Endoscopy;  Laterality: N/A;  . FISTULOGRAM Left 09/24/2019   Procedure: FISTULOGRAM LEFT ARM;  Surgeon: [name], MD;  Location: [place];  Service: Vascular;  Laterality: Left;  . HEMATOMA EVACUATION Left 10/09/2013   Procedure: EVACUATION HEMATOMA;  Surgeon: Angelia Mould, MD;  Location: [place];  Service: Vascular;  Laterality: Left;  . HEMATOMA EVACUATION Left 09/24/2019   Procedure: EVACUATION HEMATOMA   LEFT ARM;  Surgeon: [name], MD;  Location: [place];  Service: Vascular;  Laterality: Left;  . INCISION AND DRAINAGE ABSCESS     gluteal  . INSERTION OF DIALYSIS CATHETER N/A 10/09/2013   Procedure: INSERTION OF DIALYSIS CATHETER; ULTRASOUND GUIDED;  Surgeon: Angelia Mould, MD;  Location: [place];  Service: Vascular;  Laterality: N/A;  . INSERTION OF DIALYSIS CATHETER Left 09/24/2019   Procedure: INSERTION OF TUNNELLED DIALYSIS CATHETER - PALINDROME 15FR X 28CM;  Surgeon: [name], MD;  Location: [place];  Service: Vascular;  Laterality: Left;  . INSERTION OF DIALYSIS CATHETER Right 03/15/2020   Procedure: Insertion Of Dialysis Catheter;  Surgeon: Angelia Mould, MD;  Location: [place];  Service: Cardiovascular;  Laterality: Right;  . LOOP RECORDER INSERTION N/A 09/22/2020   Procedure: LOOP RECORDER INSERTION;  Surgeon: Thompson Grayer, MD;  Location: [place] [facility];  Service: Cardiovascular;  Laterality: N/A;  . PERIPHERAL VASCULAR BALLOON ANGIOPLASTY Left 12/02/2017   Procedure: PERIPHERAL VASCULAR BALLOON ANGIOPLASTY;  Surgeon: Angelia Mould, MD;  Location: [place] [facility];  Service: Cardiovascular;  Laterality: Left;  . PERIPHERAL VASCULAR BALLOON ANGIOPLASTY  10/27/2020   Procedure: PERIPHERAL VASCULAR BALLOON ANGIOPLASTY;  Surgeon: Cherre Robins, MD;  Location: [place] [facility];  Service: Cardiovascular;;  . REVISION OF ARTERIOVENOUS GORETEX GRAFT Left 04/03/2019   Procedure: REVISION OF ARTERIOVENOUS GORETEX GRAFT PSEUDOANEURYSM;  Surgeon: Angelia Mould, MD;  Location: [place];  Service: Vascular;  Laterality: Left;  . SHUNTOGRAM N/A 01/07/2014   Procedure: fistulogram with possibe venoplasty left upper arm avg;  Surgeon: Angelia Mould, MD;  Location: [facility] CATH LAB;  Service: Cardiovascular;  Laterality: N/A;  . TEE WITHOUT CARDIOVERSION N/A 09/20/2020   Procedure: TRANSESOPHAGEAL ECHOCARDIOGRAM (TEE);  Surgeon: Acie Fredrickson, Wonda Cheng, MD;   Location: [place];  Service: Cardiovascular;  Laterality: N/A;  . THROMBECTOMY AND REVISION OF ARTERIOVENTOUS (AV) GORETEX  GRAFT Left 03/15/2020   Procedure: ATTEMPTED THROMBECTOMY OF LEFT ARM ARTERIOVENTOUS (AV) GORETEX  GRAFT;  Surgeon: Angelia Mould, MD;  Location: [place];  Service: Cardiovascular;  Laterality: Left;  . tibiocalcaneal fusion  left Left   . ULTRASOUND GUIDANCE FOR VASCULAR ACCESS  09/24/2019   Procedure: Ultrasound Guidance For Vascular Access;  Surgeon: [name], MD;  Location: [place];  Service: Vascular;;    Current Outpatient Medications  Medication Sig Dispense Refill  . acetaminophen (TYLENOL) 325 MG tablet Take 2 tablets (650 mg total) by mouth every 6 (six) hours as needed for mild pain (or Fever >/= 101).    Marland Kitchen aspirin EC 325 MG EC tablet Take 1 tablet (325 mg total) by mouth daily. (Patient not taking: Reported on 10/21/2020) 30 tablet 0  . aspirin EC 81 MG tablet Take 81 mg by mouth daily. Swallow whole.    Marland Kitchen atorvastatin (LIPITOR) 40 MG tablet Take 1 tablet (40 mg total) by mouth daily. 30 tablet 0  . b complex-vitamin c-folic acid (NEPHRO-VITE) 0.8 MG TABS tablet Take 1 tablet by mouth daily at 12 noon. (Patient taking differently: Take 1 tablet by mouth  daily.) 30 tablet 0  . calcium acetate, Phos Binder, (PHOSLYRA) 667 MG/5ML SOLN Take 15 mLs (2,001 mg total) by mouth 3 (three) times daily with meals. (Patient taking differently: Take 2,001 mg by mouth See admin instructions. Take 15 mls (2001 mg) by mouth 3 times daily with meals & with each snacks.) 1350 mL 0  . Carboxymethylcellul-Glycerin (LUBRICATING EYE DROPS OP) Place 1 drop into both eyes 2 (two) times daily as needed (dry/irritated eyes.).     . Darbepoetin Alfa (ARANESP) 60 MCG/0.3ML SOSY injection Inject 0.3 mLs (60 mcg total) into the vein every Tuesday with hemodialysis. 4.2 mL   . metoprolol tartrate (LOPRESSOR) 25 MG tablet Take 1 tablet (25 mg total) by mouth 2 (two) times  daily. (Patient taking differently: Take 25 mg by mouth every evening.) 60 tablet 0  . pantoprazole (PROTONIX) 40 MG tablet Take 1 tablet (40 mg total) by mouth daily. 30 tablet 3  . PARoxetine (PAXIL) 40 MG tablet Take 1 tablet (40 mg total) by mouth daily. 30 tablet 0  . predniSONE (DELTASONE) 10 MG tablet Take 1 tablet (10 mg total) by mouth daily with breakfast. 90 tablet 3  . traZODone (DESYREL) 100 MG tablet Take 1 tablet (100 mg total) by mouth at bedtime. 30 tablet 0   No current facility-administered medications for this visit.   Allergies:  Amlodipine, Sulfa antibiotics, Vancomycin, and Venlafaxine   Social History: The patient  reports that she has never smoked. She has never used smokeless tobacco. She reports that she does not drink alcohol and does not use drugs.   Family History: The patient's family history includes Asthma in her sister; COPD in her maternal uncle; Heart attack (age of onset: 78) in her mother; Hypertension in her mother.   ROS:  Please see the history of present illness. Otherwise, complete review of systems is positive for none.  All other systems are reviewed and negative.   Physical Exam: VS:  There were no vitals taken for this visit., BMI There is no height or weight on file to calculate BMI.  Wt Readings from Last 3 Encounters:  10/27/20 125 lb (56.7 kg)  10/12/20 129 lb (58.5 kg)  09/29/20 128 lb 15.5 oz (58.5 kg)    General: Patient appears comfortable at rest. Neck: Supple, no elevated JVP or carotid bruits, no thyromegaly. Lungs: Clear to auscultation, nonlabored breathing at rest. Cardiac: Regular rate and rhythm, no S3 or significant systolic murmur, no pericardial rub. Extremities: No pitting edema, distal pulses 2+. Skin: Warm and dry. Musculoskeletal: No kyphosis. Neuropsychiatric: Alert and oriented x3, affect grossly appropriate.  ECG:  EKG 09/17/2020 sinus rhythm with a probable left atrial enlargement, right bundle branch block  rate of 81  Recent Labwork: 09/18/2020: TSH 1.058 09/20/2020: ALT 40; AST 16 09/29/2020: Platelets 120 10/27/2020: BUN 93; Creatinine, Ser 8.60; Hemoglobin 13.3; Potassium 7.6; Sodium 135     Component Value Date/Time   CHOL 209 (H) 09/18/2020 0201   TRIG 135 09/18/2020 0201   HDL 77 09/18/2020 0201   CHOLHDL 2.7 09/18/2020 0201   VLDL 27 09/18/2020 0201   LDLCALC 105 (H) 09/18/2020 0201    Other Studies Reviewed Today:  Implantable loop recorder procedure 09/22/2020 by Dr. Rayann Heman.  Secondary to cryptogenic stroke  Conclusion  SURGEON:  Thompson Grayer, MD     PREPROCEDURE DIAGNOSIS:  Cryptogenic Stroke    POSTPROCEDURE DIAGNOSIS:  Cryptogenic Stroke     PROCEDURES:   1. Implantable loop recorder implantation    INTRODUCTION:  [name] is a 44 y.o. female with a history of unexplained stroke who presents today for implantable loop implantation.  The patient has had a cryptogenic stroke.  Despite an extensive workup by neurology, no reversible causes have been identified.  she has worn telemetry during which she did not have arrhythmias.  There is significant concern for possible atrial fibrillation as the cause for the patients stroke.  The patient therefore presents today for implantable loop implantation.     DESCRIPTION OF PROCEDURE:  Informed written consent was obtained.  The patient required no sedation for the procedure today.  The patients left chest was prepped and draped. Mapping over the patient's chest was performed to identify the appropriate ILR site.  This area was found to be the left  parasternal region over the 3rd-4th intercostal space.  The skin overlying this region was infiltrated with lidocaine for local analgesia.  A 0.5-cm incision was made at the implant site.  A subcutaneous ILR pocket was fashioned using a combination of sharp and blunt dissection.  A Medtronic Reveal Linq model [id number] implantable loop recorder was then placed into the pocket R waves  were very prominent and measured > 0.2 mV. EBL<1 ml.  Steri- Strips and a sterile dressing were then applied.  There were no early apparent complications.     CONCLUSIONS:   1. Successful implantation of a Medtronic Reveal LINQ implantable loop recorder for cryptogenic stroke  2. No early apparent complications.     TEE 09/20/2020 1. Left ventricular ejection fraction, by estimation, is 55 to 60%. The left ventricle has normal function. 2. Right ventricular systolic function is normal. The right ventricular size is normal. 3. No left atrial/left atrial appendage thrombus was detected. 4. The mean MS gradient is 14 mmhg. 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 This could be the cause of her stroke. 3-D images of the mitral valve were obtained. . The mitral valve is abnormal. Moderate to severe mitral valve regurgitation. Moderate to severe mitral stenosis. Moderate to severe mitral annular calcification. 5. Tricuspid valve regurgitation is moderate to severe. 6. The aortic valve is grossly normal. Aortic valve regurgitation is mild to moderate.   TTE echocardiogram 09/19/2019 1. Left ventricular ejection fraction, by estimation, is 55 to 60%. The left ventricle has normal function. The left ventricle has no regional wall motion abnormalities. There is severe concentric left ventricular hypertrophy. Diastolic function indeterminant due to moderate-tosevere MAC. 2. Right ventricular systolic function is mildly reduced. The right ventricular size is normal. There is moderately elevated pulmonary artery systolic pressure. 3. Left atrial size was severely dilated. 4. Right atrial size was mildly dilated. 5. The mitral valve is degenerative. Moderate mitral valve regurgitation. Severe mitral stenosis with mean gradient 25mHg and MVA of 0.73cm2 by continuity equation at HR 88bpm. Moderate to  severe mitral annular calcification. 6. Tricuspid valve regurgitation is severe. 7. The aortic valve is tricuspid. There is mild calcification of the aortic valve. There is mild thickening of the aortic valve. Aortic valve regurgitation is mild. Mild to moderate aortic valve sclerosis/calcification is present, without any evidence of aortic stenosis. 8. The inferior vena cava is normal in size with <50% respiratory variability, suggesting right atrial pressure of 8 mmHg. Comparison(s): Compared to prior study on 11/2017, there is now severe calcific mitral stenosis and severe tricuspid regurgitation.   Event monitoring in March 2019 demonstrated sinus rhythm  with rare PACs.  Echocardiogram on 12/18/2017 demonstrated normal left ventricular systolic function, LVEF 16-10%, severe LVH with grade 1 diastolic dysfunction and high ventricular filling pressures, moderate to severe mitral annular calcification with mild mitral stenosis and mild to moderate mitral regurgitation. There is mild to moderate left atrial dilatation. Pulmonary pressures were only mildly increased   Assessment and Plan:  1. Mitral valve stenosis, unspecified etiology   2. Essential hypertension   3. ESRD (end stage renal disease) ([place])   4. 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 Palpitations    1. Mitral valve stenosis, unspecified etiology Recent TEE 09/20/2020: EF 55 to 60%.  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  This could be the cause of her stroke.  2D images of the mitral valve were obtained.  The mitral valve was abnormal with moderate to severe mitral valve regurgitation and moderate to severe mitral stenosis and moderate to severe mitral annular calcification.  Tricuspid valve regurgitation was moderate to severe.  Aortic valve  regurgitation mild to moderate  2. Essential hypertension Blood pressures well controlled today.  Continue metoprolol 25 mg p.o. twice daily.  3. ESRD (end stage renal disease) ([place]) Currently on hemodialysis three times a week and tolerating well.  4. 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  She denies any sequelae from the stroke.  She states the rehab after the stroke significantly helped her.  She was placed on 325 mg aspirin at discharge from recent hospitalization for CVA.  It appears she also has 81 mg aspirin on her current medication list as well.  Please discontinue the 81 mg dosage of aspirin.  5. Palpitations Recently had a loop recorder implanted by Dr. Rayann Heman on 09/22/2020 she has a remote device set up for checking at home.  Medication Adjustments/Labs and Tests Ordered: Current medicines are reviewed at length with the patient today.  Concerns regarding medicines are outlined above.   Disposition: Follow-up with Dr. Harl Bowie or APP 6 months  Signed, Levell July, NP 11/03/2020 1:06 PM    [facility] [place], [place], [place] [address] Phone: [phone]; Fax: [phone]

## 2020-11-04 ENCOUNTER — Ambulatory Visit (INDEPENDENT_AMBULATORY_CARE_PROVIDER_SITE_OTHER): Payer: Medicare Other | Admitting: Family Medicine

## 2020-11-04 ENCOUNTER — Ambulatory Visit (INDEPENDENT_AMBULATORY_CARE_PROVIDER_SITE_OTHER): Payer: Medicare Other

## 2020-11-04 ENCOUNTER — Encounter: Payer: Self-pay | Admitting: Family Medicine

## 2020-11-04 VITALS — BP 118/82 | HR 70 | Ht 64.0 in | Wt 128.0 lb

## 2020-11-04 DIAGNOSIS — I63449 Cerebral infarction due to embolism of unspecified cerebellar artery: Secondary | ICD-10-CM

## 2020-11-04 DIAGNOSIS — I10 Essential (primary) hypertension: Secondary | ICD-10-CM | POA: Diagnosis not present

## 2020-11-04 DIAGNOSIS — N186 End stage renal disease: Secondary | ICD-10-CM | POA: Diagnosis not present

## 2020-11-04 DIAGNOSIS — R002 Palpitations: Secondary | ICD-10-CM

## 2020-11-04 DIAGNOSIS — I050 Rheumatic mitral stenosis: Secondary | ICD-10-CM

## 2020-11-04 LAB — CUP PACEART REMOTE DEVICE CHECK
Date Time Interrogation Session: 20211217064211
Implantable Pulse Generator Implant Date: 20211104

## 2020-11-04 NOTE — Addendum Note (Signed)
Addended by: Julian Hy T on: 11/04/2020 09:16 AM   Modules accepted: Orders

## 2020-11-04 NOTE — Patient Instructions (Signed)
Your physician wants you to follow-up in: 6 MONTHS WITH MD You will receive a reminder letter in the mail two months in advance. If you don't receive a letter, please call our office to schedule the follow-up appointment.  Your physician recommends that you continue on your current medications as directed. Please refer to the Current Medication list given to you today.  Thank you for choosing [facility]!!    

## 2020-11-15 ENCOUNTER — Other Ambulatory Visit: Payer: Self-pay | Admitting: *Deleted

## 2020-11-15 DIAGNOSIS — Z992 Dependence on renal dialysis: Secondary | ICD-10-CM

## 2020-11-17 NOTE — Progress Notes (Signed)
[facility] Summary Report / Loop Recorder 

## 2020-11-28 ENCOUNTER — Encounter: Payer: Medicare Other | Attending: Registered Nurse | Admitting: Physical Medicine and Rehabilitation

## 2020-11-28 DIAGNOSIS — Z992 Dependence on renal dialysis: Secondary | ICD-10-CM | POA: Insufficient documentation

## 2020-11-28 DIAGNOSIS — E7849 Other hyperlipidemia: Secondary | ICD-10-CM | POA: Insufficient documentation

## 2020-11-28 DIAGNOSIS — I10 Essential (primary) hypertension: Secondary | ICD-10-CM | POA: Insufficient documentation

## 2020-11-28 DIAGNOSIS — N186 End stage renal disease: Secondary | ICD-10-CM | POA: Insufficient documentation

## 2020-11-28 DIAGNOSIS — Z8673 Personal history of transient ischemic attack (TIA), and cerebral infarction without residual deficits: Secondary | ICD-10-CM | POA: Insufficient documentation

## 2020-11-29 ENCOUNTER — Encounter: Payer: Self-pay | Admitting: Vascular Surgery

## 2020-11-29 ENCOUNTER — Other Ambulatory Visit: Payer: Self-pay

## 2020-11-29 ENCOUNTER — Ambulatory Visit (HOSPITAL_COMMUNITY)
Admission: RE | Admit: 2020-11-29 | Discharge: 2020-11-29 | Disposition: A | Discharge status: Home / Self Care | Payer: Medicare Other | Source: Ambulatory Visit | Attending: Vascular Surgery | Admitting: Vascular Surgery

## 2020-11-29 ENCOUNTER — Ambulatory Visit (INDEPENDENT_AMBULATORY_CARE_PROVIDER_SITE_OTHER): Payer: Medicare Other | Admitting: Vascular Surgery

## 2020-11-29 VITALS — BP 116/73 | HR 82 | Temp 97.1°F | Resp 14 | Ht 64.0 in | Wt 128.0 lb

## 2020-11-29 DIAGNOSIS — Z992 Dependence on renal dialysis: Secondary | ICD-10-CM | POA: Diagnosis present

## 2020-11-29 DIAGNOSIS — N186 End stage renal disease: Secondary | ICD-10-CM | POA: Diagnosis not present

## 2020-11-29 NOTE — Progress Notes (Signed)
Patient name: [name] MRN: [id number] DOB: 1976-06-17 Sex: female  REASON FOR VISIT: Follow-up to discuss dialysis access  HPI: [name] is a 45 y.o. female with multiple medical problems including end-stage renal disease on HD Tuesday Thursday Saturday that presents to discuss slow to mature right arm basilic vein fistula.  She initially had second stage right basilic vein fistula placed on 07/01/2020.  Most recently she had intervention with Dr. Stanford Breed including angioplasty of the basilic vein in the upper arm with two areas of greater than 90% stenosis.  Ultimately she presents for fistula duplex and to see if the fistula is salvageable.  She is currently using a right IJ tunnel catheter.  She denies any numbness or tingling in the arm or swelling in the arm.  Other fistula history includes a right brachial vein transposition by Dr. Bridgett Larsson in 2015 and also a left upper arm loop graft in 2012 by Dr. Scot Dock that is now occluded  Past Medical History:  Diagnosis Date  . Allergy   . Anemia   . Anxiety   . Arthritis   . Avascular necrosis of bone (HCC)    left tibial talus due to chronic prednisone use  . Cerebellar stroke ([place])   . Depression   . Dyspnea    too much  fluid  . ESRD (end stage renal disease) on dialysis ([place])    tthsat [place]  . GERD (gastroesophageal reflux disease)   . Headache(784.0)   . History of high blood pressure   . Hyperlipemia   . Hypertension   . Lupus ([place])   . Pneumonia   . Secondary hyperparathyroidism (of renal origin)     Past Surgical History:  Procedure Laterality Date  . A/V FISTULAGRAM Left 12/02/2017   Procedure: A/V FISTULAGRAM - Left upper;  Surgeon: Angelia Mould, MD;  Location: [place] [facility];  Service: Cardiovascular;  Laterality: Left;  . A/V FISTULAGRAM N/A 10/27/2020   Procedure: A/V FISTULAGRAM - Right Upper;  Surgeon: Cherre Robins, MD;  Location: [place] [facility];  Service: Cardiovascular;   Laterality: N/A;  . AV FISTULA PLACEMENT Left   . AV FISTULA PLACEMENT Right 01/26/2014   Procedure: ARTERIOVENOUS (AV) FISTULA CREATION; ultrasound guided;  Surgeon: Conrad Nokomis, MD;  Location: [place];  Service: Vascular;  Laterality: Right;  . AV FISTULA PLACEMENT Right 04/25/2020   Procedure: RIGHT UPPER ARM  BASILIC VEIN ARTERIOVENOUS (AV) FISTULA;  Surgeon: [name], MD;  Location: [place];  Service: Vascular;  Laterality: Right;  . BASCILIC VEIN TRANSPOSITION Right 07/01/2020   Procedure: SECOND STAGE BASCILIC VEIN TRANSPOSITION RIGHT UPPER EXTREMITY;  Surgeon: [name], MD;  Location: [place];  Service: Vascular;  Laterality: Right;  . BUBBLE STUDY  09/20/2020   Procedure: BUBBLE STUDY;  Surgeon: Thayer Headings, MD;  Location: [facility];  Service: Cardiovascular;;  . ESOPHAGOGASTRODUODENOSCOPY N/A 03/17/2016   Procedure: ESOPHAGOGASTRODUODENOSCOPY (EGD);  Surgeon: Irene Shipper, MD;  Location: [facility] ENDOSCOPY;  Service: Endoscopy;  Laterality: N/A;  . FISTULOGRAM Left 09/24/2019   Procedure: FISTULOGRAM LEFT ARM;  Surgeon: [name], MD;  Location: [place];  Service: Vascular;  Laterality: Left;  . HEMATOMA EVACUATION Left 10/09/2013   Procedure: EVACUATION HEMATOMA;  Surgeon: Angelia Mould, MD;  Location: [place];  Service: Vascular;  Laterality: Left;  . HEMATOMA EVACUATION Left 09/24/2019   Procedure: EVACUATION HEMATOMA  LEFT ARM;  Surgeon: [name], MD;  Location: [place];  Service: Vascular;  Laterality: Left;  . INCISION AND DRAINAGE ABSCESS     gluteal  . INSERTION OF DIALYSIS CATHETER N/A 10/09/2013   Procedure: INSERTION OF DIALYSIS CATHETER; ULTRASOUND GUIDED;  Surgeon: Angelia Mould, MD;  Location: [place];  Service: Vascular;  Laterality: N/A;  . INSERTION OF DIALYSIS CATHETER Left 09/24/2019   Procedure: INSERTION OF TUNNELLED DIALYSIS CATHETER - PALINDROME 15FR X 28CM;  Surgeon: [name], MD;  Location: [place];   Service: Vascular;  Laterality: Left;  . INSERTION OF DIALYSIS CATHETER Right 03/15/2020   Procedure: Insertion Of Dialysis Catheter;  Surgeon: Angelia Mould, MD;  Location: [place];  Service: Cardiovascular;  Laterality: Right;  . LOOP RECORDER INSERTION N/A 09/22/2020   Procedure: LOOP RECORDER INSERTION;  Surgeon: Thompson Grayer, MD;  Location: [place] [facility];  Service: Cardiovascular;  Laterality: N/A;  . PERIPHERAL VASCULAR BALLOON ANGIOPLASTY Left 12/02/2017   Procedure: PERIPHERAL VASCULAR BALLOON ANGIOPLASTY;  Surgeon: Angelia Mould, MD;  Location: [place] [facility];  Service: Cardiovascular;  Laterality: Left;  . PERIPHERAL VASCULAR BALLOON ANGIOPLASTY  10/27/2020   Procedure: PERIPHERAL VASCULAR BALLOON ANGIOPLASTY;  Surgeon: Cherre Robins, MD;  Location: [place] [facility];  Service: Cardiovascular;;  . REVISION OF ARTERIOVENOUS GORETEX GRAFT Left 04/03/2019   Procedure: REVISION OF ARTERIOVENOUS GORETEX GRAFT PSEUDOANEURYSM;  Surgeon: Angelia Mould, MD;  Location: [place];  Service: Vascular;  Laterality: Left;  . SHUNTOGRAM N/A 01/07/2014   Procedure: fistulogram with possibe venoplasty left upper arm avg;  Surgeon: Angelia Mould, MD;  Location: [facility] CATH LAB;  Service: Cardiovascular;  Laterality: N/A;  . TEE WITHOUT CARDIOVERSION N/A 09/20/2020   Procedure: TRANSESOPHAGEAL ECHOCARDIOGRAM (TEE);  Surgeon: Acie Fredrickson, Wonda Cheng, MD;  Location: [place];  Service: Cardiovascular;  Laterality: N/A;  . THROMBECTOMY AND REVISION OF ARTERIOVENTOUS (AV) GORETEX  GRAFT Left 03/15/2020   Procedure: ATTEMPTED THROMBECTOMY OF LEFT ARM ARTERIOVENTOUS (AV) GORETEX  GRAFT;  Surgeon: Angelia Mould, MD;  Location: [place];  Service: Cardiovascular;  Laterality: Left;  . tibiocalcaneal fusion  left Left   . ULTRASOUND GUIDANCE FOR VASCULAR ACCESS  09/24/2019   Procedure: Ultrasound Guidance For Vascular Access;  Surgeon: [name], MD;  Location: [facility] OR;   Service: Vascular;;    Family History  Problem Relation Age of Onset  . Asthma Sister   . Hypertension Mother   . Heart attack Mother 55       died at 59  . COPD Maternal Uncle        prostate  . Mental illness Neg Hx     SOCIAL HISTORY: Social History   Tobacco Use  . Smoking status: Never Smoker  . Smokeless tobacco: Never Used  Substance Use Topics  . Alcohol use: No    Allergies  Allergen Reactions  . Amlodipine Hives and Swelling  . Sulfa Antibiotics Hives and Itching  . Vancomycin Hives and Itching  . Venlafaxine Other (See Comments)    Emotional    Current Outpatient Medications  Medication Sig Dispense Refill  . acetaminophen (TYLENOL) 325 MG tablet Take 2 tablets (650 mg total) by mouth every 6 (six) hours as needed for mild pain (or Fever >/= 101).    Marland Kitchen aspirin EC 325 MG EC tablet Take 1 tablet (325 mg total) by mouth daily. 30 tablet 0  . atorvastatin (LIPITOR) 40 MG tablet Take 1 tablet (40 mg total) by mouth daily. 30 tablet 0  . b complex-vitamin c-folic acid (NEPHRO-VITE) 0.8  MG TABS tablet Take 1 tablet by mouth daily at 12 noon. (Patient taking differently: Take 1 tablet by mouth daily.) 30 tablet 0  . calcium acetate, Phos Binder, (PHOSLYRA) 667 MG/5ML SOLN Take 15 mLs (2,001 mg total) by mouth 3 (three) times daily with meals. (Patient taking differently: Take 2,001 mg by mouth See admin instructions. Take 15 mls (2001 mg) by mouth 3 times daily with meals & with each snacks.) 1350 mL 0  . Carboxymethylcellul-Glycerin (LUBRICATING EYE DROPS OP) Place 1 drop into both eyes 2 (two) times daily as needed (dry/irritated eyes.).     . Darbepoetin Alfa (ARANESP) 60 MCG/0.3ML SOSY injection Inject 0.3 mLs (60 mcg total) into the vein every Tuesday with hemodialysis. 4.2 mL   . metoprolol tartrate (LOPRESSOR) 25 MG tablet Take 1 tablet (25 mg total) by mouth 2 (two) times daily. (Patient taking differently: Take 25 mg by mouth every evening.) 60 tablet 0  .  pantoprazole (PROTONIX) 40 MG tablet Take 1 tablet (40 mg total) by mouth daily. 30 tablet 3  . PARoxetine (PAXIL) 40 MG tablet Take 1 tablet (40 mg total) by mouth daily. 30 tablet 0  . predniSONE (DELTASONE) 10 MG tablet Take 1 tablet (10 mg total) by mouth daily with breakfast. 90 tablet 3  . traZODone (DESYREL) 100 MG tablet Take 1 tablet (100 mg total) by mouth at bedtime. 30 tablet 0   No current facility-administered medications for this visit.    REVIEW OF SYSTEMS:  [X]  denotes positive finding, [ ]  denotes negative finding Cardiac  Comments:  Chest pain or chest pressure:    Shortness of breath upon exertion:    Short of breath when lying flat:    Irregular heart rhythm:        Vascular    Pain in calf, thigh, or hip brought on by ambulation:    Pain in feet at night that wakes you up from your sleep:     Blood clot in your veins:    Leg swelling:         Pulmonary    Oxygen at home:    Productive cough:     Wheezing:         Neurologic    Sudden weakness in arms or legs:     Sudden numbness in arms or legs:     Sudden onset of difficulty speaking or slurred speech:    Temporary loss of vision in one eye:     Problems with dizziness:         Gastrointestinal    Blood in stool:     Vomited blood:         Genitourinary    Burning when urinating:     Blood in urine:        Psychiatric    Major depression:         Hematologic    Bleeding problems:    Problems with blood clotting too easily:        Skin    Rashes or ulcers:        Constitutional    Fever or chills:      PHYSICAL EXAM: Vitals:   11/29/20 1542  BP: 116/73  Pulse: 82  Resp: 14  Temp: (!) 97.1 F (36.2 C)  TempSrc: Temporal  Weight: 128 lb (58.1 kg)  Height: 5\' 4"  (1.626 m)  PF: 95 L/min    GENERAL: The patient is a well-nourished female, in no acute distress. The vital signs  are documented above. CARDIAC: There is a regular rate and rhythm.  VASCULAR:  Weak thrill in right  upper arm fistula Right radial brachial pulses palpable  Thrombosed loop graft in left upper arm  DATA:   Fistula duplex today shows multiple stenotic segments in right arm basilic vein fistula   Assessment/Plan:  45 year old female who presents with right basilic vein fistula that has multiple stenotic segments and we have been unable to salvage this even with additional interventions in the Cath Lab.  Her fistula duplex today again shows high-grade recurrent stenosis even after recent intervention by Dr. Stanford Breed.  I do not think this is salvageable at this point and I recommended a right upper arm AV graft.  Previous had a failed loop graft in the left upper extremity that is now thrombosed.  Dr. Stanford Breed did treat a central stenosis around her catheter on the right but no right arm swelling etc and she has limited upper extremity options moving forward.  We will get her scheduled next available on a nondialysis day.   [name], MD [facility] Office: [phone]

## 2020-12-03 ENCOUNTER — Other Ambulatory Visit (HOSPITAL_COMMUNITY)
Admission: RE | Admit: 2020-12-03 | Discharge: 2020-12-03 | Disposition: A | Discharge status: Home / Self Care | Payer: Medicare Other | Source: Ambulatory Visit | Attending: Vascular Surgery | Admitting: Vascular Surgery

## 2020-12-03 DIAGNOSIS — Z01818 Encounter for other preprocedural examination: Secondary | ICD-10-CM | POA: Diagnosis present

## 2020-12-03 DIAGNOSIS — Z20822 Contact with and (suspected) exposure to covid-19: Secondary | ICD-10-CM | POA: Diagnosis not present

## 2020-12-03 LAB — SARS CORONAVIRUS 2 (TAT 6-24 HRS): SARS Coronavirus 2: NEGATIVE

## 2020-12-05 ENCOUNTER — Encounter (HOSPITAL_COMMUNITY): Payer: Self-pay | Admitting: Vascular Surgery

## 2020-12-05 ENCOUNTER — Ambulatory Visit (INDEPENDENT_AMBULATORY_CARE_PROVIDER_SITE_OTHER): Payer: Medicare Other

## 2020-12-05 ENCOUNTER — Other Ambulatory Visit: Payer: Self-pay

## 2020-12-05 ENCOUNTER — Other Ambulatory Visit (HOSPITAL_COMMUNITY): Payer: Medicare Other

## 2020-12-05 ENCOUNTER — Encounter: Payer: Self-pay | Admitting: Internal Medicine

## 2020-12-05 DIAGNOSIS — R002 Palpitations: Secondary | ICD-10-CM

## 2020-12-05 NOTE — Progress Notes (Signed)
PERIOPERATIVE PRESCRIPTION FOR IMPLANTED CARDIAC DEVICE PROGRAMMING   Patient Information: Name: Mary, [name]  DOB: Mar 28, 1976 MRN: [id number]    Planned Procedure: right arm arteriovenous graft insertion  Surgeon: Dr. Monica Martinez  Date of Procedure: 12/07/2020  Cautery will be used.  Position during surgery: supine   Please send documentation back to:  [facility] (Fax # [phone])   Wallace Cullens, RN  12/05/2020 10:29 AM         Device Information:   Clinic EP Physician:   Thompson Grayer, MD Device Type:  Cruz Condon II -loop recorder Manufacturer and Phone #:  [employer]: [id number] Pacemaker Dependent?:  No Date of Last Device Check:  11/04/2020        Normal Device Function?:  Yes     Electrophysiologist's Recommendations:   Procedure will not interfere with loop recorder function.  Per Device Clinic Standing Orders, Drake Leach  12/05/2020 2:59 PM

## 2020-12-06 NOTE — Anesthesia Preprocedure Evaluation (Addendum)
Anesthesia Evaluation  Patient identified by MRN, date of birth, ID band Patient awake    Reviewed: Allergy & Precautions, NPO status , Patient's Chart, lab work & pertinent test results  Airway Mallampati: II  TM Distance: >3 FB Neck ROM: Full    Dental  (+) Dental Advisory Given, Missing,    Pulmonary neg pulmonary ROS,    Pulmonary exam normal breath sounds clear to auscultation       Cardiovascular hypertension, Pt. on home beta blockers Normal cardiovascular exam Rhythm:Regular Rate:Normal     Neuro/Psych  Headaches, PSYCHIATRIC DISORDERS Anxiety Depression CVA, No Residual Symptoms    GI/Hepatic Neg liver ROS, PUD, GERD  Medicated and Controlled,  Endo/Other  Lupus   Renal/GU ESRF and DialysisRenal disease (TTHSAT)     Musculoskeletal  (+) Arthritis ,   Abdominal   Peds  Hematology  (+) Blood dyscrasia, anemia ,   Anesthesia Other Findings   Reproductive/Obstetrics                            Anesthesia Physical Anesthesia Plan  ASA: III  Anesthesia Plan: General   Post-op Pain Management:    Induction: Intravenous  PONV Risk Score and Plan: 3 and Midazolam, Dexamethasone and Ondansetron  Airway Management Planned: LMA  Additional Equipment:   Intra-op Plan:   Post-operative Plan: Extubation in OR  Informed Consent: I have reviewed the patients History and Physical, chart, labs and discussed the procedure including the risks, benefits and alternatives for the proposed anesthesia with the patient or authorized representative who has indicated his/her understanding and acceptance.     Dental advisory given  Plan Discussed with: CRNA  Anesthesia Plan Comments:        Anesthesia Quick Evaluation

## 2020-12-07 ENCOUNTER — Encounter (HOSPITAL_COMMUNITY): Payer: Self-pay | Admitting: Vascular Surgery

## 2020-12-07 ENCOUNTER — Ambulatory Visit (HOSPITAL_COMMUNITY)
Admission: RE | Admit: 2020-12-07 | Discharge: 2020-12-07 | Disposition: A | Discharge status: Home / Self Care | Payer: Medicare Other | Source: Home / Self Care | Attending: Vascular Surgery | Admitting: Vascular Surgery

## 2020-12-07 ENCOUNTER — Other Ambulatory Visit: Payer: Self-pay

## 2020-12-07 ENCOUNTER — Ambulatory Visit (HOSPITAL_COMMUNITY): Payer: Medicare Other

## 2020-12-07 ENCOUNTER — Encounter (HOSPITAL_COMMUNITY)
Admission: RE | Disposition: A | Discharge status: Home / Self Care | Payer: Self-pay | Source: Home / Self Care | Attending: Vascular Surgery

## 2020-12-07 DIAGNOSIS — Z79899 Other long term (current) drug therapy: Secondary | ICD-10-CM | POA: Insufficient documentation

## 2020-12-07 DIAGNOSIS — Z882 Allergy status to sulfonamides status: Secondary | ICD-10-CM | POA: Insufficient documentation

## 2020-12-07 DIAGNOSIS — Z7982 Long term (current) use of aspirin: Secondary | ICD-10-CM | POA: Insufficient documentation

## 2020-12-07 DIAGNOSIS — E875 Hyperkalemia: Secondary | ICD-10-CM | POA: Diagnosis not present

## 2020-12-07 DIAGNOSIS — N185 Chronic kidney disease, stage 5: Secondary | ICD-10-CM | POA: Diagnosis not present

## 2020-12-07 DIAGNOSIS — Z888 Allergy status to other drugs, medicaments and biological substances status: Secondary | ICD-10-CM | POA: Insufficient documentation

## 2020-12-07 DIAGNOSIS — T82858A Stenosis of vascular prosthetic devices, implants and grafts, initial encounter: Secondary | ICD-10-CM | POA: Insufficient documentation

## 2020-12-07 DIAGNOSIS — I120 Hypertensive chronic kidney disease with stage 5 chronic kidney disease or end stage renal disease: Secondary | ICD-10-CM | POA: Insufficient documentation

## 2020-12-07 DIAGNOSIS — Z7952 Long term (current) use of systemic steroids: Secondary | ICD-10-CM | POA: Insufficient documentation

## 2020-12-07 DIAGNOSIS — Z992 Dependence on renal dialysis: Secondary | ICD-10-CM | POA: Insufficient documentation

## 2020-12-07 DIAGNOSIS — N186 End stage renal disease: Secondary | ICD-10-CM | POA: Insufficient documentation

## 2020-12-07 DIAGNOSIS — Z881 Allergy status to other antibiotic agents status: Secondary | ICD-10-CM | POA: Insufficient documentation

## 2020-12-07 DIAGNOSIS — Y832 Surgical operation with anastomosis, bypass or graft as the cause of abnormal reaction of the patient, or of later complication, without mention of misadventure at the time of the procedure: Secondary | ICD-10-CM | POA: Insufficient documentation

## 2020-12-07 HISTORY — PX: AV FISTULA PLACEMENT: SHX1204

## 2020-12-07 LAB — HCG, SERUM, QUALITATIVE: Preg, Serum: NEGATIVE

## 2020-12-07 LAB — CUP PACEART REMOTE DEVICE CHECK
Date Time Interrogation Session: 20220119064326
Implantable Pulse Generator Implant Date: 20211104

## 2020-12-07 LAB — POCT I-STAT, CHEM 8
BUN: 49 mg/dL — ABNORMAL HIGH (ref 6–20)
Calcium, Ion: 1.02 mmol/L — ABNORMAL LOW (ref 1.15–1.40)
Chloride: 102 mmol/L (ref 98–111)
Creatinine, Ser: 6.4 mg/dL — ABNORMAL HIGH (ref 0.44–1.00)
Glucose, Bld: 115 mg/dL — ABNORMAL HIGH (ref 70–99)
HCT: 34 % — ABNORMAL LOW (ref 36.0–46.0)
Hemoglobin: 11.6 g/dL — ABNORMAL LOW (ref 12.0–15.0)
Potassium: 4.9 mmol/L (ref 3.5–5.1)
Sodium: 137 mmol/L (ref 135–145)
TCO2: 29 mmol/L (ref 22–32)

## 2020-12-07 SURGERY — INSERTION OF ARTERIOVENOUS (AV) GORE-TEX GRAFT ARM
Anesthesia: General | Site: Arm Upper | Laterality: Right

## 2020-12-07 MED ORDER — PROTAMINE SULFATE 10 MG/ML IV SOLN
INTRAVENOUS | Status: AC
  Filled 2020-12-07: qty 5

## 2020-12-07 MED ORDER — STERILE WATER FOR IRRIGATION IR SOLN
Status: DC | PRN
  Administered 2020-12-07: 1000 mL

## 2020-12-07 MED ORDER — DEXAMETHASONE SODIUM PHOSPHATE 10 MG/ML IJ SOLN
INTRAMUSCULAR | Status: AC
  Filled 2020-12-07: qty 1

## 2020-12-07 MED ORDER — DEXAMETHASONE SODIUM PHOSPHATE 4 MG/ML IJ SOLN
INTRAMUSCULAR | Status: DC | PRN
  Administered 2020-12-07: 4 mg via INTRAVENOUS

## 2020-12-07 MED ORDER — CHLORHEXIDINE GLUCONATE 0.12 % MT SOLN
OROMUCOSAL | Status: AC
  Filled 2020-12-07: qty 15

## 2020-12-07 MED ORDER — FENTANYL CITRATE (PF) 100 MCG/2ML IJ SOLN
INTRAMUSCULAR | Status: AC
  Filled 2020-12-07: qty 2

## 2020-12-07 MED ORDER — PHENYLEPHRINE 40 MCG/ML (10ML) SYRINGE FOR IV PUSH (FOR BLOOD PRESSURE SUPPORT)
PREFILLED_SYRINGE | INTRAVENOUS | Status: AC
  Filled 2020-12-07: qty 10

## 2020-12-07 MED ORDER — PROTAMINE SULFATE 10 MG/ML IV SOLN
INTRAVENOUS | Status: DC | PRN
  Administered 2020-12-07: 30 mg via INTRAVENOUS

## 2020-12-07 MED ORDER — METOPROLOL SUCCINATE ER 25 MG PO TB24
ORAL_TABLET | ORAL | Status: AC
  Filled 2020-12-07: qty 1

## 2020-12-07 MED ORDER — LIDOCAINE 2% (20 MG/ML) 5 ML SYRINGE
INTRAMUSCULAR | Status: DC | PRN
  Administered 2020-12-07: 60 mg via INTRAVENOUS

## 2020-12-07 MED ORDER — 0.9 % SODIUM CHLORIDE (POUR BTL) OPTIME
TOPICAL | Status: DC | PRN
  Administered 2020-12-07 (×2): 1000 mL

## 2020-12-07 MED ORDER — ONDANSETRON HCL 4 MG/2ML IJ SOLN
INTRAMUSCULAR | Status: DC | PRN
  Administered 2020-12-07: 4 mg via INTRAVENOUS

## 2020-12-07 MED ORDER — HEPARIN SODIUM (PORCINE) 1000 UNIT/ML IJ SOLN
INTRAMUSCULAR | Status: DC | PRN
  Administered 2020-12-07: 3000 [IU] via INTRAVENOUS

## 2020-12-07 MED ORDER — ONDANSETRON HCL 4 MG/2ML IJ SOLN
INTRAMUSCULAR | Status: AC
  Filled 2020-12-07: qty 2

## 2020-12-07 MED ORDER — CEFAZOLIN SODIUM-DEXTROSE 2-4 GM/100ML-% IV SOLN
2.0000 g | INTRAVENOUS | Status: AC
  Administered 2020-12-07: 2 g via INTRAVENOUS
  Filled 2020-12-07: qty 100

## 2020-12-07 MED ORDER — CHLORHEXIDINE GLUCONATE 4 % EX LIQD: 60.0000 mL | Freq: Once | CUTANEOUS | Status: DC

## 2020-12-07 MED ORDER — HEMOSTATIC AGENTS (NO CHARGE) OPTIME
TOPICAL | Status: DC | PRN
  Administered 2020-12-07 (×3): 1 via TOPICAL

## 2020-12-07 MED ORDER — LIDOCAINE HCL (PF) 1 % IJ SOLN
INTRAMUSCULAR | Status: AC
  Filled 2020-12-07: qty 30

## 2020-12-07 MED ORDER — PROPOFOL 10 MG/ML IV BOLUS
INTRAVENOUS | Status: AC
  Filled 2020-12-07: qty 20

## 2020-12-07 MED ORDER — SODIUM CHLORIDE 0.9 % IV SOLN
INTRAVENOUS | Status: DC | PRN
  Administered 2020-12-07: 09:00:00 500 mL

## 2020-12-07 MED ORDER — HEPARIN SODIUM (PORCINE) 1000 UNIT/ML IJ SOLN
INTRAMUSCULAR | Status: AC
  Filled 2020-12-07: qty 1

## 2020-12-07 MED ORDER — MIDAZOLAM HCL 2 MG/2ML IJ SOLN
INTRAMUSCULAR | Status: AC
  Filled 2020-12-07: qty 2

## 2020-12-07 MED ORDER — SODIUM CHLORIDE 0.9 % IV SOLN: INTRAVENOUS | Status: DC

## 2020-12-07 MED ORDER — PROPOFOL 10 MG/ML IV BOLUS
INTRAVENOUS | Status: DC | PRN
Start: 2020-12-07 — End: 2020-12-07
  Administered 2020-12-07: 100 mg via INTRAVENOUS

## 2020-12-07 MED ORDER — ONDANSETRON HCL 4 MG/2ML IJ SOLN: 4.0000 mg | Freq: Once | INTRAMUSCULAR | Status: DC | PRN

## 2020-12-07 MED ORDER — FENTANYL CITRATE (PF) 100 MCG/2ML IJ SOLN
INTRAMUSCULAR | Status: DC | PRN
  Administered 2020-12-07 (×5): 25 ug via INTRAVENOUS

## 2020-12-07 MED ORDER — HYDROCODONE-ACETAMINOPHEN 5-325 MG PO TABS: 1.0000 | ORAL_TABLET | Freq: Four times a day (QID) | ORAL | 0 refills | Status: AC | PRN

## 2020-12-07 MED ORDER — HEPARIN SODIUM (PORCINE) 1000 UNIT/ML IJ SOLN
2000.0000 [IU] | Freq: Once | INTRAMUSCULAR | Status: AC
  Administered 2020-12-07: 1.6 [IU] via INTRAVENOUS

## 2020-12-07 MED ORDER — METOPROLOL SUCCINATE ER 25 MG PO TB24
25.0000 mg | ORAL_TABLET | Freq: Every day | ORAL | Status: DC
  Administered 2020-12-07: 25 mg via ORAL

## 2020-12-07 MED ORDER — PHENYLEPHRINE 40 MCG/ML (10ML) SYRINGE FOR IV PUSH (FOR BLOOD PRESSURE SUPPORT)
PREFILLED_SYRINGE | INTRAVENOUS | Status: DC | PRN
  Administered 2020-12-07: 40 ug via INTRAVENOUS

## 2020-12-07 MED ORDER — ACETAMINOPHEN 500 MG PO TABS
1000.0000 mg | ORAL_TABLET | Freq: Once | ORAL | Status: AC
  Administered 2020-12-07: 1000 mg via ORAL
  Filled 2020-12-07: qty 2

## 2020-12-07 MED ORDER — FENTANYL CITRATE (PF) 100 MCG/2ML IJ SOLN
25.0000 ug | INTRAMUSCULAR | Status: DC | PRN
  Administered 2020-12-07: 50 ug via INTRAVENOUS

## 2020-12-07 MED ORDER — LIDOCAINE 2% (20 MG/ML) 5 ML SYRINGE
INTRAMUSCULAR | Status: AC
  Filled 2020-12-07: qty 5

## 2020-12-07 MED ORDER — FENTANYL CITRATE (PF) 250 MCG/5ML IJ SOLN
INTRAMUSCULAR | Status: AC
  Filled 2020-12-07: qty 5

## 2020-12-07 MED ORDER — MIDAZOLAM HCL 5 MG/5ML IJ SOLN
INTRAMUSCULAR | Status: DC | PRN
  Administered 2020-12-07: 2 mg via INTRAVENOUS

## 2020-12-07 SURGICAL SUPPLY — 34 items
ADH SKN CLS APL DERMABOND .7 (GAUZE/BANDAGES/DRESSINGS) ×1
ARMBAND PINK RESTRICT EXTREMIT (MISCELLANEOUS) ×4 IMPLANT
CANISTER SUCT 3000ML PPV (MISCELLANEOUS) ×3 IMPLANT
CLIP VESOCCLUDE MED 6/CT (CLIP) ×3 IMPLANT
CLIP VESOCCLUDE SM WIDE 6/CT (CLIP) ×3 IMPLANT
COVER PROBE W GEL 5X96 (DRAPES) ×3 IMPLANT
COVER WAND RF STERILE (DRAPES) ×3 IMPLANT
DERMABOND ADVANCED (GAUZE/BANDAGES/DRESSINGS) ×2
DERMABOND ADVANCED .7 DNX12 (GAUZE/BANDAGES/DRESSINGS) ×1 IMPLANT
ELECT REM PT RETURN 9FT ADLT (ELECTROSURGICAL) ×3
ELECTRODE REM PT RTRN 9FT ADLT (ELECTROSURGICAL) ×1 IMPLANT
GAUZE 4X4 16PLY RFD (DISPOSABLE) ×2 IMPLANT
GLOVE BIO SURGEON STRL SZ7.5 (GLOVE) ×3 IMPLANT
GLOVE SRG 8 PF TXTR STRL LF DI (GLOVE) ×1 IMPLANT
GLOVE SURG UNDER POLY LF SZ8 (GLOVE) ×3
GOWN STRL REUS W/ TWL LRG LVL3 (GOWN DISPOSABLE) ×2 IMPLANT
GOWN STRL REUS W/ TWL XL LVL3 (GOWN DISPOSABLE) ×2 IMPLANT
GOWN STRL REUS W/TWL LRG LVL3 (GOWN DISPOSABLE) ×6
GOWN STRL REUS W/TWL XL LVL3 (GOWN DISPOSABLE) ×6
GRAFT GORETEX STRT 4-7X45 (Vascular Products) ×2 IMPLANT
HEMOSTAT ARISTA ABSORB 3G PWDR (HEMOSTASIS) ×2 IMPLANT
HEMOSTAT SNOW SURGICEL 2X4 (HEMOSTASIS) ×4 IMPLANT
KIT BASIN OR (CUSTOM PROCEDURE TRAY) ×3 IMPLANT
KIT TURNOVER KIT B (KITS) ×3 IMPLANT
NS IRRIG 1000ML POUR BTL (IV SOLUTION) ×3 IMPLANT
PACK CV ACCESS (CUSTOM PROCEDURE TRAY) ×3 IMPLANT
PAD ARMBOARD 7.5X6 YLW CONV (MISCELLANEOUS) ×6 IMPLANT
SUT MNCRL AB 4-0 PS2 18 (SUTURE) ×5 IMPLANT
SUT PROLENE 6 0 BV (SUTURE) ×25 IMPLANT
SUT VIC AB 3-0 SH 27 (SUTURE) ×6
SUT VIC AB 3-0 SH 27X BRD (SUTURE) ×2 IMPLANT
TOWEL GREEN STERILE (TOWEL DISPOSABLE) ×3 IMPLANT
UNDERPAD 30X36 HEAVY ABSORB (UNDERPADS AND DIAPERS) ×3 IMPLANT
WATER STERILE IRR 1000ML POUR (IV SOLUTION) ×3 IMPLANT

## 2020-12-07 NOTE — Discharge Instructions (Signed)
° °  [facility] [place] ° °Discharge Instructions ° °AV Fistula or Graft Surgery for Dialysis Access ° °Please refer to the following instructions for your post-procedure care. Your surgeon or physician assistant will discuss any changes with you. ° °Activity ° °You may drive the day following your surgery, if you are comfortable and no longer taking prescription pain medication. Resume full activity as the soreness in your incision resolves. ° °Bathing/Showering ° °You may shower after you go home. Keep your incision dry for 48 hours. Do not soak in a bathtub, hot tub, or swim until the incision heals completely. You may not shower if you have a hemodialysis catheter. ° °Incision Care ° °Clean your incision with mild soap and water after 48 hours. Pat the area dry with a clean towel. You do not need a bandage unless otherwise instructed. Do not apply any ointments or creams to your incision. You may have skin glue on your incision. Do not peel it off. It will come off on its own in about one week. Your arm may swell a bit after surgery. To reduce swelling use pillows to elevate your arm so it is above your heart. Your doctor will tell you if you need to lightly wrap your arm with an ACE bandage. ° °Diet ° °Resume your normal diet. There are not special food restrictions following this procedure. In order to heal from your surgery, it is CRITICAL to get adequate nutrition. Your body requires vitamins, minerals, and protein. Vegetables are the best source of vitamins and minerals. Vegetables also provide the perfect balance of protein. Processed food has little nutritional value, so try to avoid this. ° °Medications ° °Resume taking all of your medications. If your incision is causing pain, you may take over-the counter pain relievers such as acetaminophen (Tylenol). If you were prescribed a stronger pain medication, please be aware these medications can cause nausea and constipation. Prevent  nausea by taking the medication with a snack or meal. Avoid constipation by drinking plenty of fluids and eating foods with high amount of fiber, such as fruits, vegetables, and grains. Do not take Tylenol if you are taking prescription pain medications. ° ° ° ° °Follow up °Your surgeon may want to see you in the office following your access surgery. If so, this will be arranged at the time of your surgery. ° °Please call us immediately for any of the following conditions: ° °Increased pain, redness, drainage (pus) from your incision site °Fever of 101 degrees or higher °Severe or worsening pain at your incision site °Hand pain or numbness. ° °Reduce your risk of vascular disease: ° °Stop smoking. If you would like help, call [facility] at 1-800-QUIT-NOW (1-800-784-8669) or [facility] at 336-586-4000 ° °Manage your cholesterol °Maintain a desired weight °Control your diabetes °Keep your blood pressure down ° °Dialysis ° °It will take several weeks to several months for your new dialysis access to be ready for use. Your surgeon will determine when it is OK to use it. Your nephrologist will continue to direct your dialysis. You can continue to use your Permcath until your new access is ready for use. ° °If you have any questions, please call the office at [phone]. ° °

## 2020-12-07 NOTE — Transfer of Care (Signed)
Immediate Anesthesia Transfer of Care Note  Patient: [name]  Procedure(s) Performed: INSERTION OF RIGHT UPPER ARM ARTERIOVENOUS (AV) LOOP GRAFT (Right Arm Upper)  Patient Location: PACU  Anesthesia Type:General  Level of Consciousness: alert , oriented and drowsy  Airway & Oxygen Therapy: Patient connected to face mask oxygen  Post-op Assessment: Report given to RN, Post -op Vital signs reviewed and stable and Patient moving all extremities X 4  Post vital signs: Reviewed and stable  Last Vitals:  Vitals Value Taken Time  BP 122/66 12/07/20 1115  Temp    Pulse 73 12/07/20 1116  Resp 12 12/07/20 1116  SpO2 100 % 12/07/20 1116  Vitals shown include unvalidated device data.  Last Pain:  Vitals:   12/07/20 0706  TempSrc:   PainSc: 0-No pain         Complications: No complications documented.

## 2020-12-07 NOTE — Op Note (Signed)
OPERATIVE NOTE   PROCEDURE:  right upper arm looped (4 mm x 7 mm) arteriovenous graft   PRE-OPERATIVE DIAGNOSIS: end stage renal disease   POST-OPERATIVE DIAGNOSIS: same  SURGEON: [name], MD  ASSISTANT(S): Arlee Muslim, PA  ANESTHESIA: LMA  ESTIMATED BLOOD LOSS: 150 mL  FINDING(S): Patient had a high brachial artery bifurcation and ultimately had to perform right upper arm loop graft with an incision in the axilla.  Artery anastomosis is on the radial side and the venous anastomosis is on the ulnar side.  There was a thrill at completion.  SPECIMEN(S):  None  INDICATIONS:   [name] is a 45 y.o. female who presents with end-stage renal disease and the need for permanent dialysis access.  She has a failed upper arm loop graft in the left arm.  She has a basilic vein in the right arm that has multiple high grade stenotic segments that have been recurrent even with endovascular intervention.  She presents today for new access in the right arm and I discussed likely AV graft placement given no usable vein.  Risk, benefits, and alternatives to access surgery were discussed.  The patient is aware the risks include but are not limited to: bleeding, infection, steal syndrome, nerve damage, ischemic monomelic neuropathy, failure to mature, and need for additional procedures.  The patient is aware of the risks and elects to proceed forward.  An assistant was needed for exposure and to expedite the case.    DESCRIPTION: After full informed written consent was obtained from the patient, the patient was brought back to the operating room and placed supine upon the operating table.  The patient was given IV antibiotics prior to proceeding.  After obtaining adequate sedation, the patient was prepped and draped in standard fashion for a right arm access procedure.    Ultimately I could not find any usable surface veins in the right arm on SonoSite.  She had a high brachial  artery bifurcation up in the axilla and I thought her best option would be an upper arm loop graft.   I made a longitudinal incision over the brachial artery in the axilla.  I dissected down through the subcutaneous tissue and  fascia carefully and was able to dissect out the brachial artery.  The artery was about 4.0 mm externally.  It was controlled proximally and distally with vessel loops.  I then dissected out the larger brachial vein through the upper arm incision.  Externally, it appeared to be 4 mm in diameter.    I then made a counterincision just above antecubitum in order to tunnel the loop graft.  I then I delivered the 4 mm x 7-mm stretch Gore-Tex graft, through this metal tunneler and oriented the 4 mm end of the graft toward the artery and the 7 mm end of the graft toward the vein. I then gave the patient 3,000 units of IV heparin to gain anticoagulation.  After waiting 2 minutes, I placed the brachial artery under tension proximally and distally with vessel loops, made an arteriotomy and extended it with a Potts scissor.  I sewed the 4-mm end of the graft to this arteriotomy with a running stitch of 6-0 Prolene in end-to-side fashion.  At this point, then I completed the anastomosis in the usual fashion.  I released the vessel loops on the inflow and allowed the artery to decompress through the graft. There was good pulsatile bleeding through this graft.  I clamped the  graft at the counterincision and sucked out all the blood in the graft and loaded the graft with heparinized saline.  At this point, I pulled the graft to appropriate length and reset my exposure of the high brachial vein. The vein was controlled with fistula clamp and ultimately I transected the vein after ligating it with 2-0 silk.  There was good venous backbleeding from the vein. I spatulated the graft to facilitate an end-to-end anastomosis.  In the process of spatulating, I cut the graft to appropriate length for this  anastomosis.  This graft was sewn to the vein in an end-to-end configuration with a 6-0 Prolene.  Prior to completing this anastomosis, I allowed the vein to back bleed and then I also allowed the artery to bleed in an antegrade fashion.  I completed this anastomosis in the usual fashion.  I irrigated out both incisions.  I used surgicel snow to get hemostasis.   The graft had a good palpable thrill.  The radial artery was palpable.  It took a long time to get hemostasis and ultimately I gave protamine for reversal and used Surgicel snow and manual pressure for an extended period of time.  Seems that she had some difficulty making clot at the end of the case and a lot of ongoing oozing from needle holes in the graft.  Ultimately there also appeared to be ongoing oozing from the arteriotomy in the brachial artery and I had to place several pledgeted sutures.  I added hemostatic powder.  The subcutaneous tissue in each incision was reapproximated with a running stitch of 3-0 Vicryl.  The skin was then reapproximated with a running subcuticular 4-0 Monocryl.  The skin was then cleaned, dried, and Dermabond used to reinforce the skin closure.   COMPLICATIONS: None  CONDITION: Stable  [name], MD [facility] Office: [place]    12/07/2020, 11:07 AM

## 2020-12-07 NOTE — Anesthesia Postprocedure Evaluation (Signed)
Anesthesia Post Note  Patient: [name]  Procedure(s) Performed: INSERTION OF RIGHT UPPER ARM ARTERIOVENOUS (AV) LOOP GRAFT (Right Arm Upper)     Patient location during evaluation: PACU Anesthesia Type: General Level of consciousness: awake and alert Pain management: pain level controlled Vital Signs Assessment: post-procedure vital signs reviewed and stable Respiratory status: spontaneous breathing, nonlabored ventilation, respiratory function stable and patient connected to nasal cannula oxygen Cardiovascular status: blood pressure returned to baseline and stable Postop Assessment: no apparent nausea or vomiting Anesthetic complications: no   No complications documented.  Last Vitals:  Vitals:   12/07/20 1130 12/07/20 1145  BP: 98/66 93/66  Pulse: 75 83  Resp: 10 17  Temp:  36.6 C  SpO2: 100% 94%    Last Pain:  Vitals:   12/07/20 1145  TempSrc:   PainSc: 2                  Catalina Gravel

## 2020-12-07 NOTE — Anesthesia Procedure Notes (Signed)
Procedure Name: LMA Insertion Date/Time: 12/07/2020 8:45 AM Performed by: Candis Shine, CRNA Pre-anesthesia Checklist: Patient identified, Emergency Drugs available, Suction available and Patient being monitored Patient Re-evaluated:Patient Re-evaluated prior to induction Oxygen Delivery Method: Circle System Utilized Preoxygenation: Pre-oxygenation with 100% oxygen Induction Type: IV induction Ventilation: Mask ventilation without difficulty LMA: LMA inserted LMA Size: 4.0 Number of attempts: 1 Airway Equipment and Method: Bite block Placement Confirmation: positive ETCO2 Tube secured with: Tape Dental Injury: Teeth and Oropharynx as per pre-operative assessment

## 2020-12-07 NOTE — H&P (Signed)
History and Physical Interval Note:  12/07/2020 8:24 AM  [name]  has presented today for surgery, with the diagnosis of END STAGE RENAL DISEASE.  The various methods of treatment have been discussed with the patient and family. After consideration of risks, benefits and other options for treatment, the patient has consented to  Procedure(s): INSERTION OF RIGHT ARM ARTERIOVENOUS (AV) GORE-TEX GRAFT (Right) as a surgical intervention.  The patient's history has been reviewed, patient examined, no change in status, stable for surgery.  I have reviewed the patient's chart and labs.  Questions were answered to the patient's satisfaction.    Right arm AVG  [name]  Patient name: [name]     MRN: [id number]        DOB: 21-Jun-1976            Sex: female  REASON FOR VISIT: Follow-up to discuss dialysis access  HPI: [name] is a 45 y.o. female with multiple medical problems including end-stage renal disease on HD Tuesday Thursday Saturday that presents to discuss slow to mature right arm basilic vein fistula.  She initially had second stage right basilic vein fistula placed on 07/01/2020.  Most recently she had intervention with Dr. Stanford Breed including angioplasty of the basilic vein in the upper arm with two areas of greater than 90% stenosis.  Ultimately she presents for fistula duplex and to see if the fistula is salvageable.  She is currently using a right IJ tunnel catheter.  She denies any numbness or tingling in the arm or swelling in the arm.  Other fistula history includes a right brachial vein transposition by Dr. Bridgett Larsson in 2015 and also a left upper arm loop graft in 2012 by Dr. Scot Dock that is now occluded      Past Medical History:  Diagnosis Date  . Allergy   . Anemia   . Anxiety   . Arthritis   . Avascular necrosis of bone (HCC)    left tibial talus due to chronic prednisone use  . Cerebellar stroke ([place])   . Depression   . Dyspnea     too much  fluid  . ESRD (end stage renal disease) on dialysis ([place])    tthsat [place]  . GERD (gastroesophageal reflux disease)   . Headache(784.0)   . History of high blood pressure   . Hyperlipemia   . Hypertension   . Lupus ([place])   . Pneumonia   . Secondary hyperparathyroidism (of renal origin)          Past Surgical History:  Procedure Laterality Date  . A/V FISTULAGRAM Left 12/02/2017   Procedure: A/V FISTULAGRAM - Left upper;  Surgeon: Angelia Mould, MD;  Location: [facility];  Service: Cardiovascular;  Laterality: Left;  . A/V FISTULAGRAM N/A 10/27/2020   Procedure: A/V FISTULAGRAM - Right Upper;  Surgeon: Cherre Robins, MD;  Location: [facility];  Service: Cardiovascular;  Laterality: N/A;  . AV FISTULA PLACEMENT Left   . AV FISTULA PLACEMENT Right 01/26/2014   Procedure: ARTERIOVENOUS (AV) FISTULA CREATION; ultrasound guided;  Surgeon: Conrad , MD;  Location: [place];  Service: Vascular;  Laterality: Right;  . AV FISTULA PLACEMENT Right 04/25/2020   Procedure: RIGHT UPPER ARM  BASILIC VEIN ARTERIOVENOUS (AV) FISTULA;  Surgeon: Mary Heck, MD;  Location: [place];  Service: Vascular;  Laterality: Right;  . BASCILIC VEIN TRANSPOSITION Right 07/01/2020   Procedure: SECOND STAGE BASCILIC VEIN TRANSPOSITION RIGHT UPPER  EXTREMITY;  Surgeon: Mary Heck, MD;  Location: [place];  Service: Vascular;  Laterality: Right;  . BUBBLE STUDY  09/20/2020   Procedure: BUBBLE STUDY;  Surgeon: Thayer Headings, MD;  Location: [facility];  Service: Cardiovascular;;  . ESOPHAGOGASTRODUODENOSCOPY N/A 03/17/2016   Procedure: ESOPHAGOGASTRODUODENOSCOPY (EGD);  Surgeon: Irene Shipper, MD;  Location: [facility] ENDOSCOPY;  Service: Endoscopy;  Laterality: N/A;  . FISTULOGRAM Left 09/24/2019   Procedure: FISTULOGRAM LEFT ARM;  Surgeon: Mary Heck, MD;  Location: [place];  Service: Vascular;  Laterality: Left;  . HEMATOMA EVACUATION  Left 10/09/2013   Procedure: EVACUATION HEMATOMA;  Surgeon: Angelia Mould, MD;  Location: [place];  Service: Vascular;  Laterality: Left;  . HEMATOMA EVACUATION Left 09/24/2019   Procedure: EVACUATION HEMATOMA  LEFT ARM;  Surgeon: Mary Heck, MD;  Location: [place];  Service: Vascular;  Laterality: Left;  . INCISION AND DRAINAGE ABSCESS     gluteal  . INSERTION OF DIALYSIS CATHETER N/A 10/09/2013   Procedure: INSERTION OF DIALYSIS CATHETER; ULTRASOUND GUIDED;  Surgeon: Angelia Mould, MD;  Location: [place];  Service: Vascular;  Laterality: N/A;  . INSERTION OF DIALYSIS CATHETER Left 09/24/2019   Procedure: INSERTION OF TUNNELLED DIALYSIS CATHETER - PALINDROME 15FR X 28CM;  Surgeon: Mary Heck, MD;  Location: [place];  Service: Vascular;  Laterality: Left;  . INSERTION OF DIALYSIS CATHETER Right 03/15/2020   Procedure: Insertion Of Dialysis Catheter;  Surgeon: Angelia Mould, MD;  Location: [place];  Service: Cardiovascular;  Laterality: Right;  . LOOP RECORDER INSERTION N/A 09/22/2020   Procedure: LOOP RECORDER INSERTION;  Surgeon: Thompson Grayer, MD;  Location: [facility] [facility];  Service: Cardiovascular;  Laterality: N/A;  . PERIPHERAL VASCULAR BALLOON ANGIOPLASTY Left 12/02/2017   Procedure: PERIPHERAL VASCULAR BALLOON ANGIOPLASTY;  Surgeon: Angelia Mould, MD;  Location: [facility] [facility];  Service: Cardiovascular;  Laterality: Left;  . PERIPHERAL VASCULAR BALLOON ANGIOPLASTY  10/27/2020   Procedure: PERIPHERAL VASCULAR BALLOON ANGIOPLASTY;  Surgeon: Cherre Robins, MD;  Location: [facility];  Service: Cardiovascular;;  . REVISION OF ARTERIOVENOUS GORETEX GRAFT Left 04/03/2019   Procedure: REVISION OF ARTERIOVENOUS GORETEX GRAFT PSEUDOANEURYSM;  Surgeon: Angelia Mould, MD;  Location: [place];  Service: Vascular;  Laterality: Left;  . SHUNTOGRAM N/A 01/07/2014   Procedure: fistulogram with possibe venoplasty left upper arm avg;   Surgeon: Angelia Mould, MD;  [facility] CATH LAB;  Service: Cardiovascular;  Laterality: N/A;  . TEE WITHOUT CARDIOVERSION N/A 09/20/2020   Procedure: TRANSESOPHAGEAL ECHOCARDIOGRAM (TEE);  Surgeon: Acie Fredrickson, Wonda Cheng, MD;  Location: [place];  Service: Cardiovascular;  Laterality: N/A;  . THROMBECTOMY AND REVISION OF ARTERIOVENTOUS (AV) GORETEX  GRAFT Left 03/15/2020   Procedure: ATTEMPTED THROMBECTOMY OF LEFT ARM ARTERIOVENTOUS (AV) GORETEX  GRAFT;  Surgeon: Angelia Mould, MD;  Location: [place];  Service: Cardiovascular;  Laterality: Left;  . tibiocalcaneal fusion  left Left   . ULTRASOUND GUIDANCE FOR VASCULAR ACCESS  09/24/2019   Procedure: Ultrasound Guidance For Vascular Access;  Surgeon: Mary Heck, MD;  Location: [facility] OR;  Service: Vascular;;         Family History  Problem Relation Age of Onset  . Asthma Sister   . Hypertension Mother   . Heart attack Mother 1       died at 69  . COPD Maternal Uncle        prostate  . Mental illness Neg Hx     SOCIAL HISTORY:  Social History   Tobacco Use  . Smoking status: Never Smoker  . Smokeless tobacco: Never Used  Substance Use Topics  . Alcohol use: No         Allergies  Allergen Reactions  . Amlodipine Hives and Swelling  . Sulfa Antibiotics Hives and Itching  . Vancomycin Hives and Itching  . Venlafaxine Other (See Comments)    Emotional          Current Outpatient Medications  Medication Sig Dispense Refill  . acetaminophen (TYLENOL) 325 MG tablet Take 2 tablets (650 mg total) by mouth every 6 (six) hours as needed for mild pain (or Fever >/= 101).    Mar[place] Kitchen aspirin EC 325 MG EC tablet Take 1 tablet (325 mg total) by mouth daily. 30 tablet 0  . atorvastatin (LIPITOR) 40 MG tablet Take 1 tablet (40 mg total) by mouth daily. 30 tablet 0  . b complex-vitamin c-folic acid (NEPHRO-VITE) 0.8 MG TABS tablet Take 1 tablet by mouth daily at 12 noon. (Patient taking differently:  Take 1 tablet by mouth daily.) 30 tablet 0  . calcium acetate, Phos Binder, (PHOSLYRA) 667 MG/5ML SOLN Take 15 mLs (2,001 mg total) by mouth 3 (three) times daily with meals. (Patient taking differently: Take 2,001 mg by mouth See admin instructions. Take 15 mls (2001 mg) by mouth 3 times daily with meals & with each snacks.) 1350 mL 0  . Carboxymethylcellul-Glycerin (LUBRICATING EYE DROPS OP) Place 1 drop into both eyes 2 (two) times daily as needed (dry/irritated eyes.).     . Darbepoetin Alfa (ARANESP) 60 MCG/0.3ML SOSY injection Inject 0.3 mLs (60 mcg total) into the vein every Tuesday with hemodialysis. 4.2 mL   . metoprolol tartrate (LOPRESSOR) 25 MG tablet Take 1 tablet (25 mg total) by mouth 2 (two) times daily. (Patient taking differently: Take 25 mg by mouth every evening.) 60 tablet 0  . pantoprazole (PROTONIX) 40 MG tablet Take 1 tablet (40 mg total) by mouth daily. 30 tablet 3  . PARoxetine (PAXIL) 40 MG tablet Take 1 tablet (40 mg total) by mouth daily. 30 tablet 0  . predniSONE (DELTASONE) 10 MG tablet Take 1 tablet (10 mg total) by mouth daily with breakfast. 90 tablet 3  . traZODone (DESYREL) 100 MG tablet Take 1 tablet (100 mg total) by mouth at bedtime. 30 tablet 0   No current facility-administered medications for this visit.    REVIEW OF SYSTEMS:  '[X]'$  denotes positive finding, '[ ]'$  denotes negative finding Cardiac  Comments:  Chest pain or chest pressure:    Shortness of breath upon exertion:    Short of breath when lying flat:    Irregular heart rhythm:        Vascular    Pain in calf, thigh, or hip brought on by ambulation:    Pain in feet at night that wakes you up from your sleep:     Blood clot in your veins:    Leg swelling:         Pulmonary    Oxygen at home:    Productive cough:     Wheezing:         Neurologic    Sudden weakness in arms or legs:     Sudden numbness in arms or legs:     Sudden onset of  difficulty speaking or slurred speech:    Temporary loss of vision in one eye:     Problems with dizziness:  Gastrointestinal    Blood in stool:     Vomited blood:         Genitourinary    Burning when urinating:     Blood in urine:        Psychiatric    Major depression:         Hematologic    Bleeding problems:    Problems with blood clotting too easily:        Skin    Rashes or ulcers:        Constitutional    Fever or chills:      PHYSICAL EXAM:    Vitals:   11/29/20 1542  BP: 116/73  Pulse: 82  Resp: 14  Temp: (!) 97.1 F (36.2 C)  TempSrc: Temporal  Weight: 128 lb (58.1 kg)  Height: '5\' 4"'$  (1.626 m)  PF: 95 L/min    GENERAL: The patient is a well-nourished female, in no acute distress. The vital signs are documented above. CARDIAC: There is a regular rate and rhythm.  VASCULAR:  Weak thrill in right upper arm fistula Right radial brachial pulses palpable  Thrombosed loop graft in left upper arm  DATA:   Fistula duplex today shows multiple stenotic segments in right arm basilic vein fistula   Assessment/Plan:  45 year old female who presents with right basilic vein fistula that has multiple stenotic segments and we have been unable to salvage this even with additional interventions in the Cath Lab.  Her fistula duplex today again shows high-grade recurrent stenosis even after recent intervention by Dr. Stanford Breed.  I do not think this is salvageable at this point and I recommended a right upper arm AV graft.  Previous had a failed loop graft in the left upper extremity that is now thrombosed.  Dr. Stanford Breed did treat a central stenosis around her catheter on the right but no right arm swelling etc and she has limited upper extremity options moving forward.  We will get her scheduled next available on a nondialysis day.   Mary Heck, MD [facility]  [place] Office: [phone]

## 2020-12-08 ENCOUNTER — Encounter (HOSPITAL_COMMUNITY): Payer: Self-pay | Admitting: Vascular Surgery

## 2020-12-08 ENCOUNTER — Inpatient Hospital Stay (HOSPITAL_COMMUNITY)
Admission: EM | Admit: 2020-12-08 | Discharge: 2020-12-10 | DRG: 628 | Disposition: A | Discharge status: Home / Self Care | Payer: Medicare Other | Attending: Internal Medicine | Admitting: Internal Medicine

## 2020-12-08 ENCOUNTER — Emergency Department (HOSPITAL_COMMUNITY): Payer: Medicare Other

## 2020-12-08 ENCOUNTER — Other Ambulatory Visit: Payer: Self-pay

## 2020-12-08 DIAGNOSIS — E785 Hyperlipidemia, unspecified: Secondary | ICD-10-CM | POA: Diagnosis present

## 2020-12-08 DIAGNOSIS — N2581 Secondary hyperparathyroidism of renal origin: Secondary | ICD-10-CM | POA: Diagnosis present

## 2020-12-08 DIAGNOSIS — Z8711 Personal history of peptic ulcer disease: Secondary | ICD-10-CM

## 2020-12-08 DIAGNOSIS — Z7982 Long term (current) use of aspirin: Secondary | ICD-10-CM

## 2020-12-08 DIAGNOSIS — Z79899 Other long term (current) drug therapy: Secondary | ICD-10-CM

## 2020-12-08 DIAGNOSIS — E875 Hyperkalemia: Principal | ICD-10-CM | POA: Diagnosis present

## 2020-12-08 DIAGNOSIS — K219 Gastro-esophageal reflux disease without esophagitis: Secondary | ICD-10-CM | POA: Diagnosis not present

## 2020-12-08 DIAGNOSIS — N186 End stage renal disease: Secondary | ICD-10-CM | POA: Diagnosis not present

## 2020-12-08 DIAGNOSIS — R002 Palpitations: Secondary | ICD-10-CM | POA: Diagnosis present

## 2020-12-08 DIAGNOSIS — Z8249 Family history of ischemic heart disease and other diseases of the circulatory system: Secondary | ICD-10-CM

## 2020-12-08 DIAGNOSIS — I10 Essential (primary) hypertension: Secondary | ICD-10-CM | POA: Diagnosis not present

## 2020-12-08 DIAGNOSIS — Z8673 Personal history of transient ischemic attack (TIA), and cerebral infarction without residual deficits: Secondary | ICD-10-CM

## 2020-12-08 DIAGNOSIS — D72829 Elevated white blood cell count, unspecified: Secondary | ICD-10-CM | POA: Diagnosis present

## 2020-12-08 DIAGNOSIS — I959 Hypotension, unspecified: Secondary | ICD-10-CM | POA: Diagnosis present

## 2020-12-08 DIAGNOSIS — Z8719 Personal history of other diseases of the digestive system: Secondary | ICD-10-CM

## 2020-12-08 DIAGNOSIS — M3214 Glomerular disease in systemic lupus erythematosus: Secondary | ICD-10-CM | POA: Diagnosis present

## 2020-12-08 DIAGNOSIS — Z20822 Contact with and (suspected) exposure to covid-19: Secondary | ICD-10-CM | POA: Diagnosis present

## 2020-12-08 DIAGNOSIS — D696 Thrombocytopenia, unspecified: Secondary | ICD-10-CM | POA: Diagnosis present

## 2020-12-08 DIAGNOSIS — R600 Localized edema: Secondary | ICD-10-CM | POA: Diagnosis present

## 2020-12-08 DIAGNOSIS — D62 Acute posthemorrhagic anemia: Secondary | ICD-10-CM | POA: Diagnosis present

## 2020-12-08 DIAGNOSIS — Z882 Allergy status to sulfonamides status: Secondary | ICD-10-CM

## 2020-12-08 DIAGNOSIS — D649 Anemia, unspecified: Secondary | ICD-10-CM

## 2020-12-08 DIAGNOSIS — Z992 Dependence on renal dialysis: Secondary | ICD-10-CM

## 2020-12-08 DIAGNOSIS — F32A Depression, unspecified: Secondary | ICD-10-CM | POA: Diagnosis present

## 2020-12-08 DIAGNOSIS — I120 Hypertensive chronic kidney disease with stage 5 chronic kidney disease or end stage renal disease: Secondary | ICD-10-CM | POA: Diagnosis present

## 2020-12-08 DIAGNOSIS — Z881 Allergy status to other antibiotic agents status: Secondary | ICD-10-CM

## 2020-12-08 DIAGNOSIS — Z888 Allergy status to other drugs, medicaments and biological substances status: Secondary | ICD-10-CM

## 2020-12-08 DIAGNOSIS — R197 Diarrhea, unspecified: Secondary | ICD-10-CM

## 2020-12-08 DIAGNOSIS — E8889 Other specified metabolic disorders: Secondary | ICD-10-CM | POA: Diagnosis present

## 2020-12-08 DIAGNOSIS — F419 Anxiety disorder, unspecified: Secondary | ICD-10-CM | POA: Diagnosis present

## 2020-12-08 DIAGNOSIS — A0472 Enterocolitis due to Clostridium difficile, not specified as recurrent: Secondary | ICD-10-CM | POA: Diagnosis present

## 2020-12-08 DIAGNOSIS — D631 Anemia in chronic kidney disease: Secondary | ICD-10-CM | POA: Diagnosis present

## 2020-12-08 DIAGNOSIS — M329 Systemic lupus erythematosus, unspecified: Secondary | ICD-10-CM | POA: Diagnosis present

## 2020-12-08 DIAGNOSIS — E860 Dehydration: Secondary | ICD-10-CM | POA: Diagnosis present

## 2020-12-08 LAB — CBC WITH DIFFERENTIAL/PLATELET
Abs Immature Granulocytes: 0.98 10*3/uL — ABNORMAL HIGH (ref 0.00–0.07)
Basophils Absolute: 0.1 10*3/uL (ref 0.0–0.1)
Basophils Relative: 1 %
Eosinophils Absolute: 0 10*3/uL (ref 0.0–0.5)
Eosinophils Relative: 0 %
HCT: 29.4 % — ABNORMAL LOW (ref 36.0–46.0)
Hemoglobin: 8.8 g/dL — ABNORMAL LOW (ref 12.0–15.0)
Immature Granulocytes: 5 %
Lymphocytes Relative: 10 %
Lymphs Abs: 2.2 10*3/uL (ref 0.7–4.0)
MCH: 29.9 pg (ref 26.0–34.0)
MCHC: 29.9 g/dL — ABNORMAL LOW (ref 30.0–36.0)
MCV: 100 fL (ref 80.0–100.0)
Monocytes Absolute: 2.1 10*3/uL — ABNORMAL HIGH (ref 0.1–1.0)
Monocytes Relative: 10 %
Neutro Abs: 15.7 10*3/uL — ABNORMAL HIGH (ref 1.7–7.7)
Neutrophils Relative %: 74 %
Platelets: 91 10*3/uL — ABNORMAL LOW (ref 150–400)
RBC: 2.94 MIL/uL — ABNORMAL LOW (ref 3.87–5.11)
RDW: 18.3 % — ABNORMAL HIGH (ref 11.5–15.5)
WBC: 21.2 10*3/uL — ABNORMAL HIGH (ref 4.0–10.5)
nRBC: 0.4 % — ABNORMAL HIGH (ref 0.0–0.2)

## 2020-12-08 LAB — BASIC METABOLIC PANEL
Anion gap: 16 — ABNORMAL HIGH (ref 5–15)
BUN: 70 mg/dL — ABNORMAL HIGH (ref 6–20)
CO2: 18 mmol/L — ABNORMAL LOW (ref 22–32)
Calcium: 6.9 mg/dL — ABNORMAL LOW (ref 8.9–10.3)
Chloride: 100 mmol/L (ref 98–111)
Creatinine, Ser: 7.61 mg/dL — ABNORMAL HIGH (ref 0.44–1.00)
GFR, Estimated: 6 mL/min — ABNORMAL LOW (ref >60)
Glucose, Bld: 225 mg/dL — ABNORMAL HIGH (ref 70–99)
Potassium: 6.5 mmol/L (ref 3.5–5.1)
Sodium: 134 mmol/L — ABNORMAL LOW (ref 135–145)

## 2020-12-08 LAB — PREPARE RBC (CROSSMATCH)

## 2020-12-08 LAB — LACTIC ACID, PLASMA: Lactic Acid, Venous: 3.5 mmol/L (ref 0.5–1.9)

## 2020-12-08 LAB — C DIFFICILE (CDIFF) QUICK SCRN (NO PCR REFLEX)
C Diff antigen: POSITIVE — AB
C Diff toxin: NEGATIVE

## 2020-12-08 LAB — POTASSIUM: Potassium: 3.1 mmol/L — ABNORMAL LOW (ref 3.5–5.1)

## 2020-12-08 LAB — PROTIME-INR
INR: 1.1 (ref 0.8–1.2)
Prothrombin Time: 13.8 seconds (ref 11.4–15.2)

## 2020-12-08 MED ORDER — SODIUM CHLORIDE 0.9 % IV SOLN: 100.0000 mL | INTRAVENOUS | Status: DC | PRN

## 2020-12-08 MED ORDER — FIDAXOMICIN 200 MG PO TABS
200.0000 mg | ORAL_TABLET | Freq: Two times a day (BID) | ORAL | Status: DC
  Administered 2020-12-09 – 2020-12-10 (×3): 200 mg via ORAL
  Filled 2020-12-08 (×8): qty 1

## 2020-12-08 MED ORDER — ACETAMINOPHEN 325 MG PO TABS
650.0000 mg | ORAL_TABLET | Freq: Four times a day (QID) | ORAL | Status: DC | PRN
  Administered 2020-12-09 (×2): 650 mg via ORAL
  Filled 2020-12-08 (×2): qty 2

## 2020-12-08 MED ORDER — ALBUTEROL SULFATE (2.5 MG/3ML) 0.083% IN NEBU: 2.5000 mg | INHALATION_SOLUTION | RESPIRATORY_TRACT | Status: DC | PRN

## 2020-12-08 MED ORDER — SODIUM CHLORIDE 0.9 % IV BOLUS
1000.0000 mL | Freq: Once | INTRAVENOUS | Status: AC
  Administered 2020-12-08: 1000 mL via INTRAVENOUS

## 2020-12-08 MED ORDER — ACETAMINOPHEN 650 MG RE SUPP: 650.0000 mg | Freq: Four times a day (QID) | RECTAL | Status: DC | PRN

## 2020-12-08 MED ORDER — SODIUM CHLORIDE 0.9% IV SOLUTION: Freq: Once | INTRAVENOUS | Status: DC

## 2020-12-08 MED ORDER — PANTOPRAZOLE SODIUM 40 MG PO TBEC
40.0000 mg | DELAYED_RELEASE_TABLET | Freq: Every day | ORAL | Status: DC
  Administered 2020-12-08: 40 mg via ORAL
  Filled 2020-12-08: qty 1

## 2020-12-08 MED ORDER — ONDANSETRON HCL 4 MG PO TABS
4.0000 mg | ORAL_TABLET | Freq: Four times a day (QID) | ORAL | Status: DC | PRN
  Administered 2020-12-08: 4 mg via ORAL
  Filled 2020-12-08: qty 1

## 2020-12-08 MED ORDER — HEPARIN SODIUM (PORCINE) 1000 UNIT/ML DIALYSIS: 1000.0000 [IU] | INTRAMUSCULAR | Status: DC | PRN

## 2020-12-08 MED ORDER — FENTANYL CITRATE (PF) 100 MCG/2ML IJ SOLN: 12.5000 ug | INTRAMUSCULAR | Status: DC | PRN

## 2020-12-08 MED ORDER — ALTEPLASE 2 MG IJ SOLR: 2.0000 mg | Freq: Once | INTRAMUSCULAR | Status: DC | PRN

## 2020-12-08 MED ORDER — HEPARIN SODIUM (PORCINE) 1000 UNIT/ML DIALYSIS
3200.0000 [IU] | INTRAMUSCULAR | Status: DC | PRN
  Filled 2020-12-08: qty 4

## 2020-12-08 MED ORDER — LACTATED RINGERS IV SOLN: INTRAVENOUS | Status: AC

## 2020-12-08 MED ORDER — ONDANSETRON HCL 4 MG/2ML IJ SOLN: 4.0000 mg | Freq: Four times a day (QID) | INTRAMUSCULAR | Status: DC | PRN

## 2020-12-08 MED ORDER — CHLORHEXIDINE GLUCONATE CLOTH 2 % EX PADS
6.0000 | MEDICATED_PAD | Freq: Every day | CUTANEOUS | Status: DC
  Administered 2020-12-09: 6 via TOPICAL

## 2020-12-08 MED ORDER — SODIUM ZIRCONIUM CYCLOSILICATE 5 G PO PACK
10.0000 g | PACK | Freq: Once | ORAL | Status: AC
  Administered 2020-12-08: 10 g via ORAL
  Filled 2020-12-08: qty 2

## 2020-12-08 NOTE — Progress Notes (Signed)
[facility] night shift.  The staff reports that the patient has a lactic acid level of 3.5 mmol/L.  The staff also communicated to me that she currently does not have an IV site and may need a PICC line to be placed in the morning if further IV medications/treatment is required.  She has been admitted for acute blood loss anemia, hyperkalemia, but also has been having multiple diarrhea episodes and tested positive earlier for C. difficile toxin.  She just recently went for HD and we will obtain a repeat lactic acid level before dialysis is over.  I have requested a consult from pharmacy to treat her C. difficile as she is allergic to vancomycin.  Tennis Must, MD.

## 2020-12-08 NOTE — ED Notes (Signed)
Dr Gilford Raid informed of lack of cardiac monitoring and pt will be some place in room when available.

## 2020-12-08 NOTE — Progress Notes (Signed)
Was informed of pts presence in ER-  S/p new AVG yesterday at [facility] with complication of bleeding-  Today could not get to OP HD and K of 6.5.  Given lokelma but will plan for hd later today   Louis Meckel

## 2020-12-08 NOTE — ED Notes (Signed)
Pt in hallway, not able to place on cardiac monitor.

## 2020-12-08 NOTE — ED Notes (Signed)
Having difficulty with obtaining IV. U/S attempted.

## 2020-12-08 NOTE — ED Notes (Signed)
Lab tech in to attempt remaining labs.

## 2020-12-08 NOTE — ED Notes (Signed)
Ace wrap applied to right upper arm with very light pressure.  Arm is dark and swollen.  No active bleeding noted.  Pt is having episodes of loose stools.

## 2020-12-08 NOTE — ED Notes (Signed)
Date and time results received: 12/08/20 1:03 PM  (use smartphrase ".now" to insert current time)  Test: Potassium Critical Value: 6.5  Name of Provider Notified: Dr. Gilford Raid  Orders Received? Or Actions Taken?: N/a

## 2020-12-08 NOTE — H&P (Signed)
History and Physical  [facility]  DOMINIC HINOTE [id number] DOB: 02-05-1976 DOA: 12/08/2020  PCP: Leeanne Rio, MD  Patient coming from: Home   I have personally briefly reviewed patient's old medical records in [place]  Chief Complaint: weakness   HPI: [name] is a 45 y.o. female with medical history significant for ESRD on HD TTS (missed HD today), with history of multiple failed AV fistulas status post having right upper arm looped AV graft placed by Dr. Carlis Abbott on 12/07/2020.  Reportedly there were some ongoing oozing from the arteriotomy in the brachial artery that was treated with hemostatic powder etc.  Patient was sent home.  Patient presented to the emergency department today complaining of weakness and noted to be hypotensive with a systolic blood pressure in the 70s bolused with 500 cc of normal saline with improvement of blood pressure up into the 80s.  She missed dialysis today.  Her hemoglobin dropped from 11-8.  On call vascular surgery was contacted and recommended transfusing 1 unit PRBC and light pressure Ace bandage to the right upper extremity.  Monitor CBC.  No need to transfer to [facility].  Nephrology was consulted due to patient's hyperkalemia with a potassium of 6.4.  Hemodialysis is planned for later today.  Review of Systems: Review of Systems  Constitutional: Positive for malaise/fatigue. Negative for chills and fever.  HENT: Negative for ear discharge, ear pain, hearing loss, nosebleeds and tinnitus.   Eyes: Negative for blurred vision, photophobia, pain and discharge.  Respiratory: Negative for cough, hemoptysis, sputum production, shortness of breath and wheezing.   Cardiovascular: Negative for chest pain, palpitations, orthopnea, claudication, leg swelling and PND.  Gastrointestinal: Positive for heartburn and nausea. Negative for abdominal pain, constipation, diarrhea and vomiting.  Genitourinary: Negative.  Negative for  dysuria.  Musculoskeletal: Negative.   Skin: Negative for itching and rash.  Neurological: Positive for dizziness and weakness. Negative for tingling, tremors, sensory change and headaches.  Endo/Heme/Allergies: Negative.   Psychiatric/Behavioral: Negative.    Past Medical History:  Diagnosis Date  . Allergy   . Anemia   . Anxiety   . Arthritis   . Avascular necrosis of bone (HCC)    left tibial talus due to chronic prednisone use  . Cerebellar stroke ([place])   . Depression   . Dyspnea    too much  fluid  . ESRD (end stage renal disease) on dialysis ([place])    tthsat [place]  . GERD (gastroesophageal reflux disease)   . Headache(784.0)   . History of high blood pressure   . Hyperlipemia   . Hypertension   . Lupus ([place])   . Pneumonia   . Secondary hyperparathyroidism (of renal origin)     Past Surgical History:  Procedure Laterality Date  . A/V FISTULAGRAM Left 12/02/2017   Procedure: A/V FISTULAGRAM - Left upper;  Surgeon: Angelia Mould, MD;  Location: [place] CV LAB;  Service: Cardiovascular;  Laterality: Left;  . A/V FISTULAGRAM N/A 10/27/2020   Procedure: A/V FISTULAGRAM - Right Upper;  Surgeon: Cherre Robins, MD;  Location: [place] CV LAB;  Service: Cardiovascular;  Laterality: N/A;  . AV FISTULA PLACEMENT Left   . AV FISTULA PLACEMENT Right 01/26/2014   Procedure: ARTERIOVENOUS (AV) FISTULA CREATION; ultrasound guided;  Surgeon: Conrad Haiku-Pauwela, MD;  Location: [place];  Service: Vascular;  Laterality: Right;  . AV FISTULA PLACEMENT Right 04/25/2020   Procedure: RIGHT UPPER ARM  BASILIC VEIN ARTERIOVENOUS (AV) FISTULA;  Surgeon: [name], MD;  Location: [facility] OR;  Service: Vascular;  Laterality: Right;  . AV FISTULA PLACEMENT Right 12/07/2020   Procedure: INSERTION OF RIGHT UPPER ARM ARTERIOVENOUS (AV) LOOP GRAFT;  Surgeon: [name], MD;  Location: [place];  Service: Vascular;  Laterality: Right;  . BASCILIC VEIN TRANSPOSITION Right  07/01/2020   Procedure: SECOND STAGE BASCILIC VEIN TRANSPOSITION RIGHT UPPER EXTREMITY;  Surgeon: [name], MD;  Location: [place];  Service: Vascular;  Laterality: Right;  . BUBBLE STUDY  09/20/2020   Procedure: BUBBLE STUDY;  Surgeon: Thayer Headings, MD;  Location: [facility] ENDOSCOPY;  Service: Cardiovascular;;  . ESOPHAGOGASTRODUODENOSCOPY N/A 03/17/2016   Procedure: ESOPHAGOGASTRODUODENOSCOPY (EGD);  Surgeon: Irene Shipper, MD;  Location: [facility] END[facility]COPY;  Service: Endoscopy;  Laterality: N/A;  . FISTULOGRAM Left 09/24/2019   Procedure: FISTULOGRAM LEFT ARM;  Surgeon: [name], MD;  Location: [place];  Service: Vascular;  Laterality: Left;  . HEMATOMA EVACUATION Left 10/09/2013   Procedure: EVACUATION HEMATOMA;  Surgeon: Angelia Mould, MD;  Location: [place];  Service: Vascular;  Laterality: Left;  . HEMATOMA EVACUATION Left 09/24/2019   Procedure: EVACUATION HEMATOMA  LEFT ARM;  Surgeon: [name], MD;  Location: [place];  Service: Vascular;  Laterality: Left;  . INCISION AND DRAINAGE ABSCESS     gluteal  . INSERTION OF DIALYSIS CATHETER N/A 10/09/2013   Procedure: INSERTION OF DIALYSIS CATHETER; ULTRASOUND GUIDED;  Surgeon: Angelia Mould, MD;  Location: [place];  Service: Vascular;  Laterality: N/A;  . INSERTION OF DIALYSIS CATHETER Left 09/24/2019   Procedure: INSERTION OF TUNNELLED DIALYSIS CATHETER - PALINDROME 15FR X 28CM;  Surgeon: [name], MD;  Location: [place];  Service: Vascular;  Laterality: Left;  . INSERTION OF DIALYSIS CATHETER Right 03/15/2020   Procedure: Insertion Of Dialysis Catheter;  Surgeon: Angelia Mould, MD;  Location: [place];  Service: Cardiovascular;  Laterality: Right;  . LOOP RECORDER INSERTION N/A 09/22/2020   Procedure: LOOP RECORDER INSERTION;  Surgeon: Thompson Grayer, MD;  Location: [place] CV LAB;  Service: Cardiovascular;  Laterality: N/A;  . PERIPHERAL VASCULAR BALLOON ANGIOPLASTY Left 12/02/2017   Procedure:  PERIPHERAL VASCULAR BALLOON ANGIOPLASTY;  Surgeon: Angelia Mould, MD;  Location: [place] [facility];  Service: Cardiovascular;  Laterality: Left;  . PERIPHERAL VASCULAR BALLOON ANGIOPLASTY  10/27/2020   Procedure: PERIPHERAL VASCULAR BALLOON ANGIOPLASTY;  Surgeon: Cherre Robins, MD;  Location: [place] CV LAB;  Service: Cardiovascular;;  . REVISION OF ARTERIOVENOUS GORETEX GRAFT Left 04/03/2019   Procedure: REVISION OF ARTERIOVENOUS GORETEX GRAFT PSEUDOANEURYSM;  Surgeon: Angelia Mould, MD;  Location: [place];  Service: Vascular;  Laterality: Left;  . SHUNTOGRAM N/A 01/07/2014   Procedure: fistulogram with possibe venoplasty left upper arm avg;  Surgeon: Angelia Mould, MD;  Location: [facility] C[facility] LAB;  Service: Cardiovascular;  Laterality: N/A;  . TEE WITHOUT CARDIOVERSION N/A 09/20/2020   Procedure: TRANSESOPHAGEAL ECHOCARDIOGRAM (TEE);  Surgeon: Acie Fredrickson, Wonda Cheng, MD;  Location: [place];  Service: Cardiovascular;  Laterality: N/A;  . THROMBECTOMY AND REVISION OF ARTERIOVENTOUS (AV) GORETEX  GRAFT Left 03/15/2020   Procedure: ATTEMPTED THROMBECTOMY OF LEFT ARM ARTERIOVENTOUS (AV) GORETEX  GRAFT;  Surgeon: Angelia Mould, MD;  Location: [place];  Service: Cardiovascular;  Laterality: Left;  . tibiocalcaneal fusion  left Left   . ULTRASOUND GUIDANCE FOR VASCULAR ACCESS  09/24/2019   Procedure: Ultrasound Guidance For Vascular Access;  Surgeon: [name], MD;  Location: [place];  Service:  Vascular;;     reports that she has never smoked. She has never used smokeless tobacco. She reports that she does not drink alcohol and does not use drugs.  Allergies  Allergen Reactions  . Amlodipine Hives and Swelling  . Sulfa Antibiotics Hives and Itching  . Vancomycin Hives and Itching  . Venlafaxine Other (See Comments)    Emotional    Family History  Problem Relation Age of Onset  . Asthma Sister   . Hypertension Mother   . Heart attack Mother 14       died  at 7  . COPD Maternal Uncle        prostate  . Mental illness Neg Hx      Prior to Admission medications   Medication Sig Start Date End Date Taking? Authorizing Provider  acetaminophen (TYLENOL) 325 MG tablet Take 2 tablets (650 mg total) by mouth every 6 (six) hours as needed for mild pain (or Fever >/= 101). 09/29/20   Angiulli, Lavon Paganini, PA-C  aspirin EC 325 MG EC tablet Take 1 tablet (325 mg total) by mouth daily. 09/23/20   Gifford Shave, MD  atorvastatin (LIPITOR) 40 MG tablet Take 1 tablet (40 mg total) by mouth daily. 09/29/20   Angiulli, Lavon Paganini, PA-C  b complex-vitamin c-folic acid (NEPHRO-VITE) 0.8 MG TABS tablet Take 1 tablet by mouth daily at 12 noon. Patient taking differently: Take 1 tablet by mouth daily. 09/29/20   Angiulli, Lavon Paganini, PA-C  calcium acetate, Phos Binder, (PHOSLYRA) 667 MG/5ML SOLN Take 15 mLs (2,001 mg total) by mouth 3 (three) times daily with meals. Patient taking differently: Take 2,001 mg by mouth See admin instructions. Take 15 mls (2001 mg) by mouth 3 times daily with meals & with each snacks. 09/29/20   Angiulli, Lavon Paganini, PA-C  Carboxymethylcellul-Glycerin (LUBRICATING EYE DROPS OP) Place 1 drop into both eyes 2 (two) times daily as needed (dry/irritated eyes.).     [provider]  Darbepoetin Alfa (ARANESP) 60 MCG/0.3ML SOSY injection Inject 0.3 mLs (60 mcg total) into the vein every Tuesday with hemodialysis. 10/04/20   Angiulli, Lavon Paganini, PA-C  HYDROcodone-acetaminophen (NORCO) 5-325 MG tablet Take 1 tablet by mouth every 6 (six) hours as needed for moderate pain. 12/07/20   Dagoberto Ligas, PA-C  metoprolol succinate (TOPROL-XL) 25 MG 24 hr tablet Take 25 mg by mouth daily. 11/01/20   [provider]  pantoprazole (PROTONIX) 40 MG tablet Take 1 tablet (40 mg total) by mouth daily. 09/29/20   Angiulli, Lavon Paganini, PA-C  PARoxetine (PAXIL) 40 MG tablet Take 1 tablet (40 mg total) by mouth daily. 09/29/20   Angiulli, Lavon Paganini, PA-C   predniSONE (DELTASONE) 10 MG tablet Take 1 tablet (10 mg total) by mouth daily with breakfast. 09/29/20   Angiulli, Lavon Paganini, PA-C  traZODone (DESYREL) 100 MG tablet Take 1 tablet (100 mg total) by mouth at bedtime. 09/29/20   Cathlyn Parsons, PA-C    Physical Exam: Vitals:   12/08/20 1130 12/08/20 1243 12/08/20 1310 12/08/20 1338  BP: (!) 75/47 (!) 88/57 (!) 78/38 92/79  Pulse:  68  66  Resp:  18  18  Temp:      TempSrc:      SpO2:  100%  100%   Constitutional: Chronically ill-appearing female lying in the bed awake and alert, NAD, calm, comfortable Eyes: PERRL, lids and conjunctivae normal ENMT: Mucous membranes are moist and pale. Posterior pharynx clear of any exudate or lesions.  Neck: normal, supple,  no masses, no thyromegaly Respiratory: clear to auscultation bilaterally, no wheezing, no crackles. Normal respiratory effort. No accessory muscle use.  Cardiovascular: Normal S1-S2 sounds, no murmurs / rubs / gallops.  Abdomen: no tenderness, no masses palpated. No hepatosplenomegaly. Bowel sounds positive.  Musculoskeletal: tender edematous bruised appearing right upper extremity, good pulses BUEs, no clubbing / cyanosis. No joint deformity upper and lower extremities. Good ROM, no contractures. Normal muscle tone.  Skin: no rashes, lesions, ulcers. No induration Neurologic: CN 2-12 grossly intact. Sensation intact, DTR normal. Strength 5/5 in all 4.  Psychiatric: Normal judgment and insight. Alert and oriented x 3. Normal mood.   Labs on Admission: I have personally reviewed following labs and imaging studies  CBC: Recent Labs  Lab 12/07/20 0707 12/08/20 1218  WBC  --  21.2*  NEUTROABS  --  15.7*  HGB 11.6* 8.8*  HCT 34.0* 29.4*  MCV  --  100.0  PLT  --  91*   Basic Metabolic Panel: Recent Labs  Lab 12/07/20 0707 12/08/20 1218  NA 137 134*  K 4.9 6.5*  CL 102 100  CO2  --  18*  GLUCOSE 115* 225*  BUN 49* 70*  CREATININE 6.40* 7.61*  CALCIUM  --  6.9*    GFR: Estimated Creatinine Clearance: 8.1 mL/min (A) (by C-G formula based on SCr of 7.61 mg/dL (H)). Liver Function Tests: No results for input(s): AST, ALT, ALKPHOS, BILITOT, PROT, ALBUMIN in the last 168 hours. No results for input(s): LIPASE, AMYLASE in the last 168 hours. No results for input(s): AMMONIA in the last 168 hours. Coagulation Profile: No results for input(s): INR, PROTIME in the last 168 hours. Cardiac Enzymes: No results for input(s): CKTOTAL, CKMB, CKMBINDEX, TROPONINI in the last 168 hours. BNP (last 3 results) No results for input(s): PROBNP in the last 8760 hours. HbA1C: No results for input(s): HGBA1C in the last 72 hours. CBG: No results for input(s): GLUCAP in the last 168 hours. Lipid Profile: No results for input(s): CHOL, HDL, LDLCALC, TRIG, CHOLHDL, LDLDIRECT in the last 72 hours. Thyroid Function Tests: No results for input(s): TSH, T4TOTAL, FREET4, T3FREE, THYROIDAB in the last 72 hours. Anemia Panel: No results for input(s): VITAMINB12, FOLATE, FERRITIN, TIBC, IRON, RETICCTPCT in the last 72 hours. Urine analysis:    Component Value Date/Time   COLORURINE YELLOW 06/11/2011 0959   APPEARANCEUR TURBID (A) 06/11/2011 0959   LABSPEC 1.025 06/11/2011 0959   PHURINE 5.0 06/11/2011 0959   GLUCOSEU NEGATIVE 06/11/2011 0959   HGBUR MODERATE (A) 06/11/2011 0959   BILIRUBINUR NEGATIVE 06/11/2011 0959   KETONESUR NEGATIVE 06/11/2011 0959   PROTEINUR 100 (A) 06/11/2011 0959   UROBILINOGEN 0.2 06/11/2011 0959   NITRITE NEGATIVE 06/11/2011 0959   LEUKOCYTESUR TRACE (A) 06/11/2011 0959   Radiological Exams on Admission: DG Chest 2 View  Result Date: 12/08/2020 CLINICAL DATA:  Weakness.  AV fistula placement yesterday. EXAM: CHEST - 2 VIEW COMPARISON:  09/22/2020 FINDINGS: Right jugular dual lumen catheter extends into the right atrium unchanged in position. Negative for edema. Bibasilar atelectasis/scarring is unchanged. Possible small pleural effusions  bilaterally. Cardiac loop recorder noted.  Stent in the left upper arm. IMPRESSION: Chronic scar/atelectasis in the bases.  No acute abnormality. Electronically Signed   By: Franchot Gallo M.D.   On: 12/08/2020 13:28   CUP PACEART REMOTE DEVICE CHECK  Result Date: 12/07/2020 ILR summary report received. Battery status OK. Normal device function. No new symptom, tachy, brady, or pause episodes. No new AF episodes. Monthly summary reports  and ROV/PRN. HB  Assessment/Plan Principal Problem:   Acute blood loss anemia Active Problems:   ESRD (end stage renal disease) (HCC)   Secondary renal hyperparathyroidism (HCC)   Essential hypertension   Lupus (HCC)   Thrombocytopenia (HCC)   GERD (gastroesophageal reflux disease)   Heart palpitations   Hyperkalemia   Bilateral lower extremity edema   History of duodenal ulcer   ESRD on dialysis (HCC)   Leukocytosis   1. Acute blood loss anemia - from recent surgery, Hg down 11 to 8.  Transfuse 1 unit PRBC.  Recheck CBC in AM.  2. Hypotension - from dehydration and acute blood loss - responded favorably to IV fluid hydration.  Holding antihypertensives.  3. ESRD on HD - Pt missed HD today and having hyperkalemia - Nephrology planning HD later today.  Kayexalate given.  4. Hyperkalemia - treating with HD and 1 dose of kayexalate.  5. Leukocytosis - suspect reactive from vascular surgery done yesterday.   6. GERD - protonix ordered for GI protection.    DVT prophylaxis: SCD  Code Status: full   Family Communication:   Disposition Plan:   Consults called:  nephrology Admission status: OBV   Alekai Pocock MD [facility] How to contact the [facility] Attending or Consulting provider [place] or covering provider during after hours [place], for this patient?  1. Check the care team in [facility] and look for a) attending/consulting [facility] provider listed and b) the [facility] team listed 2. Log into [email or web address] and use [place]'s universal password to access.  If you do not have the password, please contact the hospital operator. 3. Locate the [facility] provider you are looking for under [facility] and page to a number that you can be directly reached. 4. If you still have difficulty reaching the provider, please page the [facility] (Director on Call) for the Hospitalists listed on amion for assistance.  If 7PM-7AM, please contact night-coverage [email or web address] Password [facility]  12/08/2020, 2:53 PM

## 2020-12-08 NOTE — Progress Notes (Signed)
Pharmacy Antibiotic Note  [name] is a 45 y.o. female admitted on 12/08/2020 with Cdiff - first occurrence. Pt has allergy to vancomycin (hives/itching).  Pharmacy has been consulted for alternative medication dosing. D/w Onnie Boer (ID pharmacist) and will use fidaxomicin - informed Dr. Olevia Bowens of plan.  1/20 Cdiff: antigen positive/toxin negative - pt with symptoms (+watery diarrhea). Cdiff PCR pending.  Plan: Fidaxomicin '200mg'$  BID x 10 days Will f/u Cdiff PCR results  Weight: 60.5 kg (133 lb 6.1 oz)  Temp (24hrs), Avg:98 F (36.7 C), Min:97.8 F (36.6 C), Max:98.3 F (36.8 C)  Recent Labs  Lab 12/07/20 0707 12/08/20 1218 12/08/20 2042  WBC  --  21.2*  --   CREATININE 6.40* 7.61*  --   LATICACIDVEN  --   --  3.5*    Estimated Creatinine Clearance: 8.1 mL/min (A) (by C-G formula based on SCr of 7.61 mg/dL (H)).    Allergies  Allergen Reactions  . Amlodipine Hives and Swelling  . Sulfa Antibiotics Hives and Itching  . Vancomycin Hives and Itching  . Venlafaxine Other (See Comments)    Emotional    Antimicrobials this admission: 1/20 Fidaxomicin >>1/30  Microbiology results: 1/20 BCx:  1/20 Cdiff: antigen positive/toxin negative  1/20 GI PCR: pending  Thank you for allowing pharmacy to be a part of this patient's care.  Sherlon Handing, PharmD, BCPS Please see amion for complete clinical pharmacist phone list 12/08/2020 10:32 PM

## 2020-12-08 NOTE — Procedures (Signed)
° °  HEMODIALYSIS TREATMENT NOTE:  3 hour heparin-free HD completed via right chest wall TDC.  Cath exit site is unremarkable.  Goal programmed only for fluids given during HD, however pt had two episodes of symptomatic hypotension (SBP 80s) relieved with NS boluses and O2 placement at 2L via [place].  Additionally, one unit pRBCs was transfused.  Net UF +800 cc.  All blood was returned.   Rockwell Alexandria, RN

## 2020-12-08 NOTE — ED Triage Notes (Signed)
Pt arrives from home via [facility]. Pt reports having AV graft placed yesterday. Pt reports weakness. Per Ems pts systolic  BP on arrival was 83 and dropped into the 70s. Pt was given 500 NaCl bolus en rute.

## 2020-12-08 NOTE — ED Provider Notes (Signed)
[facility] EMERGENCY DEPARTMENT Provider Note   CSN: [id number] Arrival date & time: 12/08/20  [id number]     History Chief Complaint  Patient presents with  . Hypotension    [name] is a 45 y.o. female.  Pt presents to the ED today with feeling weak.  Pt has a hx of ESRD and has had multiple AV fistulas placed.  She has failed an upper loop graft in the left arm and has a basilic vein which has had recurrent stenotic segments.  She went to the OR yesterday for a new access in the right upper am.  It looks like the surgery went well, but it took a long time for hemostasis and she was given protamine.  She had ongoing oozing from the surgical sites and hemostatic powder and pledget sutures were applied.  Pt called EMS today due to the weakness.  BP was in the 70s.  She was given 500 cc NS and bp is up to the 80s.  She is feeling better.  She is supposed to go to dialysis today at 31.        Past Medical History:  Diagnosis Date  . Allergy   . Anemia   . Anxiety   . Arthritis   . Avascular necrosis of bone (HCC)    left tibial talus due to chronic prednisone use  . Cerebellar stroke ([place])   . Depression   . Dyspnea    too much  fluid  . ESRD (end stage renal disease) on dialysis ([place])    tthsat [place]  . GERD (gastroesophageal reflux disease)   . Headache(784.0)   . History of high blood pressure   . Hyperlipemia   . Hypertension   . Lupus ([place])   . Pneumonia   . Secondary hyperparathyroidism (of renal origin)     Patient Active Problem List   Diagnosis Date Noted  . 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 ESRD on dialysis ([place])   . Steroid-induced hyperglycemia   . Avascular necrosis ([place])   . Anemia   . Dizziness   . Nausea   . CVA (cerebral vascular accident) ([place]) 09/17/2020  . Cerebellar stroke ([place])   . CAP (community acquired pneumonia) due to Chlamydia species 06/08/2020  . AV fistula occlusion ([place])  03/16/2020  . AV fistula occlusion, initial encounter ([place]) 03/14/2020  . Hyperkalemia   . Hematoma of arm, left, initial encounter 09/25/2019  . AV graft malfunction ([place]) 09/24/2019  . Mild dysplasia of cervix (CIN I) bx provern 10/2018 11/26/2018  . Bilateral lower extremity edema 10/22/2018  . Pre-transplant evaluation for kidney transplant 08/22/2018  . Anxiety and depression 04/16/2018  . Difficulty sleeping 04/16/2018  . Heart palpitations 11/20/2017  . History of duodenal ulcer 10/21/2017  . Metabolic disorder [id number]  . Osteopathy in diseases classified elsewhere, unspecified site 09/20/2017  . GERD (gastroesophageal reflux disease) 07/10/2016  . Acute blood loss anemia 03/17/2016  . Thrombocytopenia ([place]) 03/17/2016  . Duodenal ulcer hemorrhagic   . Esophageal stricture   . Adjustment disorder with mixed anxiety and depressed mood 10/25/2015  . Lupus ([place]) 04/29/2015  . ESRD (end stage renal disease) ([place]) 10/09/2013  . Anemia in chronic kidney disease 10/09/2013  . Secondary renal hyperparathyroidism ([place]) 10/09/2013  . Essential hypertension 10/09/2013    Past Surgical History:  Procedure Laterality Date  . A/V FISTULAGRAM Left 12/02/2017   Procedure: A/V FISTULAGRAM - Left upper;  Surgeon: Angelia Mould, MD;  Location: [place] [facility];  Service: Cardiovascular;  Laterality: Left;  . A/V FISTULAGRAM N/A 10/27/2020   Procedure: A/V FISTULAGRAM - Right Upper;  Surgeon: Cherre Robins, MD;  Location: [place] [facility] LAB;  Service: Cardiovascular;  Laterality: N/A;  . AV FISTULA PLACEMENT Left   . AV FISTULA PLACEMENT Right 01/26/2014   Procedure: ARTERIOVENOUS (AV) FISTULA CREATION; ultrasound guided;  Surgeon: Conrad Graysville, MD;  Location: [place];  Service: Vascular;  Laterality: Right;  . AV FISTULA PLACEMENT Right 04/25/2020   Procedure: RIGHT UPPER ARM  BASILIC VEIN ARTERIOVENOUS (AV) FISTULA;  Surgeon: [name], MD;  Location: [place];  Service:  Vascular;  Laterality: Right;  . AV FISTULA PLACEMENT Right 12/07/2020   Procedure: INSERTION OF RIGHT UPPER ARM ARTERIOVENOUS (AV) LOOP GRAFT;  Surgeon: [name], MD;  Location: [place];  Service: Vascular;  Laterality: Right;  . BASCILIC VEIN TRANSPOSITION Right 07/01/2020   Procedure: SECOND STAGE BASCILIC VEIN TRANSPOSITION RIGHT UPPER EXTREMITY;  Surgeon: [name], MD;  Location: [place];  Service: Vascular;  Laterality: Right;  . BUBBLE STUDY  09/20/2020   Procedure: BUBBLE STUDY;  Surgeon: Thayer Headings, MD;  Location: [facility] ENDOSCOPY;  Service: Cardiovascular;;  . ESOPHAGOGASTRODUODENOSCOPY N/A 03/17/2016   Procedure: ESOPHAGOGASTRODUODENOSCOPY (EGD);  Surgeon: Irene Shipper, MD;  Location: [facility] ENDOSCOPY;  Service: Endoscopy;  Laterality: N/A;  . FISTULOGRAM Left 09/24/2019   Procedure: FISTULOGRAM LEFT ARM;  Surgeon: [name], MD;  Location: [place];  Service: Vascular;  Laterality: Left;  . HEMATOMA EVACUATION Left 10/09/2013   Procedure: EVACUATION HEMATOMA;  Surgeon: Angelia Mould, MD;  Location: [place];  Service: Vascular;  Laterality: Left;  . HEMATOMA EVACUATION Left 09/24/2019   Procedure: EVACUATION HEMATOMA  LEFT ARM;  Surgeon: [name], MD;  Location: [place];  Service: Vascular;  Laterality: Left;  . INCISION AND DRAINAGE ABSCESS     gluteal  . INSERTION OF DIALYSIS CATHETER N/A 10/09/2013   Procedure: INSERTION OF DIALYSIS CATHETER; ULTRASOUND GUIDED;  Surgeon: Angelia Mould, MD;  Location: [place];  Service: Vascular;  Laterality: N/A;  . INSERTION OF DIALYSIS CATHETER Left 09/24/2019   Procedure: INSERTION OF TUNNELLED DIALYSIS CATHETER - PALINDROME 15FR X 28CM;  Surgeon: [name], MD;  Location: [place];  Service: Vascular;  Laterality: Left;  . INSERTION OF DIALYSIS CATHETER Right 03/15/2020   Procedure: Insertion Of Dialysis Catheter;  Surgeon: Angelia Mould, MD;  Location: [place];  Service: Cardiovascular;   Laterality: Right;  . LOOP RECORDER INSERTION N/A 09/22/2020   Procedure: LOOP RECORDER INSERTION;  Surgeon: Thompson Grayer, MD;  Location: [place] [facility];  Service: Cardiovascular;  Laterality: N/A;  . PERIPHERAL VASCULAR BALLOON ANGIOPLASTY Left 12/02/2017   Procedure: PERIPHERAL VASCULAR BALLOON ANGIOPLASTY;  Surgeon: Angelia Mould, MD;  Location: [place] CV LAB;  Service: Cardiovascular;  Laterality: Left;  . PERIPHERAL VASCULAR BALLOON ANGIOPLASTY  10/27/2020   Procedure: PERIPHERAL VASCULAR BALLOON ANGIOPLASTY;  Surgeon: Cherre Robins, MD;  Location: [place] [facility];  Service: Cardiovascular;;  . REVISION OF ARTERIOVENOUS GORETEX GRAFT Left 04/03/2019   Procedure: REVISION OF ARTERIOVENOUS GORETEX GRAFT PSEUDOANEURYSM;  Surgeon: Angelia Mould, MD;  Location: [place];  Service: Vascular;  Laterality: Left;  . SHUNTOGRAM N/A 01/07/2014   Procedure: fistulogram with possibe venoplasty left upper arm avg;  Surgeon: Angelia Mould, MD;  Location: [facility] CATH LAB;  Service: Cardiovascular;  Laterality: N/A;  . TEE WITHOUT CARDIOVERSION N/A  09/20/2020   Procedure: TRANSESOPHAGEAL ECHOCARDIOGRAM (TEE);  Surgeon: Acie Fredrickson, Wonda Cheng, MD;  Location: [address];  Service: Cardiovascular;  Laterality: N/A;  . THROMBECTOMY AND REVISION OF ARTERIOVENTOUS (AV) GORETEX  GRAFT Left 03/15/2020   Procedure: ATTEMPTED THROMBECTOMY OF LEFT ARM ARTERIOVENTOUS (AV) GORETEX  GRAFT;  Surgeon: Angelia Mould, MD;  Location: [place];  Service: Cardiovascular;  Laterality: Left;  . tibiocalcaneal fusion  left Left   . ULTRASOUND GUIDANCE FOR VASCULAR ACCESS  09/24/2019   Procedure: Ultrasound Guidance For Vascular Access;  Surgeon: [name], MD;  Location: [facility] OR;  Service: Vascular;;     OB History    Gravida  0   Para  0   Term  0   Preterm  0   AB  0   Living  0     SAB  0   IAB  0   Ectopic  0   Multiple  0   Live Births  0           Family  History  Problem Relation Age of Onset  . Asthma Sister   . Hypertension Mother   . Heart attack Mother 64       died at 3  . COPD Maternal Uncle        prostate  . Mental illness Neg Hx     Social History   Tobacco Use  . Smoking status: Never Smoker  . Smokeless tobacco: Never Used  Vaping Use  . Vaping Use: Never used  Substance Use Topics  . Alcohol use: No  . Drug use: No    Home Medications Prior to Admission medications   Medication Sig Start Date End Date Taking? Authorizing Provider  acetaminophen (TYLENOL) 325 MG tablet Take 2 tablets (650 mg total) by mouth every 6 (six) hours as needed for mild pain (or Fever >/= 101). 09/29/20   Angiulli, Lavon Paganini, PA-C  aspirin EC 325 MG EC tablet Take 1 tablet (325 mg total) by mouth daily. 09/23/20   Gifford Shave, MD  atorvastatin (LIPITOR) 40 MG tablet Take 1 tablet (40 mg total) by mouth daily. 09/29/20   Angiulli, Lavon Paganini, PA-C  b complex-vitamin c-folic acid (NEPHRO-VITE) 0.8 MG TABS tablet Take 1 tablet by mouth daily at 12 noon. Patient taking differently: Take 1 tablet by mouth daily. 09/29/20   Angiulli, Lavon Paganini, PA-C  calcium acetate, Phos Binder, (PHOSLYRA) 667 MG/5ML SOLN Take 15 mLs (2,001 mg total) by mouth 3 (three) times daily with meals. Patient taking differently: Take 2,001 mg by mouth See admin instructions. Take 15 mls (2001 mg) by mouth 3 times daily with meals & with each snacks. 09/29/20   Angiulli, Lavon Paganini, PA-C  Carboxymethylcellul-Glycerin (LUBRICATING EYE DROPS OP) Place 1 drop into both eyes 2 (two) times daily as needed (dry/irritated eyes.).     [provider]  Darbepoetin Alfa (ARANESP) 60 MCG/0.3ML SOSY injection Inject 0.3 mLs (60 mcg total) into the vein every Tuesday with hemodialysis. 10/04/20   Angiulli, Lavon Paganini, PA-C  HYDROcodone-acetaminophen (NORCO) 5-325 MG tablet Take 1 tablet by mouth every 6 (six) hours as needed for moderate pain. 12/07/20   Dagoberto Ligas, PA-C   metoprolol succinate (TOPROL-XL) 25 MG 24 hr tablet Take 25 mg by mouth daily. 11/01/20   [provider]  pantoprazole (PROTONIX) 40 MG tablet Take 1 tablet (40 mg total) by mouth daily. 09/29/20   Angiulli, Lavon Paganini, PA-C  PARoxetine (PAXIL) 40 MG tablet Take 1 tablet (  40 mg total) by mouth daily. 09/29/20   Angiulli, Lavon Paganini, PA-C  predniSONE (DELTASONE) 10 MG tablet Take 1 tablet (10 mg total) by mouth daily with breakfast. 09/29/20   Angiulli, Lavon Paganini, PA-C  traZODone (DESYREL) 100 MG tablet Take 1 tablet (100 mg total) by mouth at bedtime. 09/29/20   Angiulli, Lavon Paganini, PA-C    Allergies    Amlodipine, Sulfa antibiotics, Vancomycin, and Venlafaxine  Review of Systems   Review of Systems  Neurological: Positive for weakness.  All other systems reviewed and are negative.   Physical Exam Updated Vital Signs BP 92/79 (BP Location: Left Arm)   Pulse 66   Temp 98.3 F (36.8 C) (Oral)   Resp 18   SpO2 100%   Physical Exam Vitals and nursing note reviewed.  Constitutional:      Appearance: Normal appearance.  HENT:     Head: Normocephalic and atraumatic.     Right Ear: External ear normal.     Left Ear: External ear normal.     Nose: Nose normal.     Mouth/Throat:     Mouth: Mucous membranes are moist.     Pharynx: Oropharynx is clear.  Eyes:     Pupils: Pupils are equal, round, and reactive to light.  Cardiovascular:     Rate and Rhythm: Normal rate and regular rhythm.     Pulses: Normal pulses.     Heart sounds: Normal heart sounds.  Pulmonary:     Effort: Pulmonary effort is normal.     Breath sounds: Normal breath sounds.  Abdominal:     General: Abdomen is flat. Bowel sounds are normal.     Palpations: Abdomen is soft.  Musculoskeletal:     Cervical back: Normal range of motion and neck supple.  Skin:    Capillary Refill: Capillary refill takes less than 2 seconds.     Comments: RUE;  + thrill, +ecchymosis  Good peripheral pulses.  No numbness in  hand.  Neurological:     General: No focal deficit present.     Mental Status: She is alert and oriented to person, place, and time.     ED Results / Procedures / Treatments   Labs (all labs ordered are listed, but only abnormal results are displayed) Labs Reviewed  BASIC METABOLIC PANEL - Abnormal; Notable for the following components:      Result Value   Sodium 134 (*)    Potassium 6.5 (*)    CO2 18 (*)    Glucose, Bld 225 (*)    BUN 70 (*)    Creatinine, Ser 7.61 (*)    Calcium 6.9 (*)    GFR, Estimated 6 (*)    Anion gap 16 (*)    All other components within normal limits  CBC WITH DIFFERENTIAL/PLATELET - Abnormal; Notable for the following components:   WBC 21.2 (*)    RBC 2.94 (*)    Hemoglobin 8.8 (*)    HCT 29.4 (*)    MCHC 29.9 (*)    RDW 18.3 (*)    Platelets 91 (*)    nRBC 0.4 (*)    Neutro Abs 15.7 (*)    Monocytes Absolute 2.1 (*)    Abs Immature Granulocytes 0.98 (*)    All other components within normal limits  CULTURE, BLOOD (ROUTINE X 2)  CULTURE, BLOOD (ROUTINE X 2)  GASTROINTESTINAL PANEL BY PCR, STOOL (REPLACES STOOL CULTURE)  C DIFFICILE (CDIFF) QUICK SCRN (NO PCR REFLEX)  PROTIME-INR  LACTIC  ACID, PLASMA  LACTIC ACID, PLASMA  TYPE AND SCREEN  PREPARE RBC (CROSSMATCH)    EKG None  Radiology DG Chest 2 View  Result Date: 12/08/2020 CLINICAL DATA:  Weakness.  AV fistula placement yesterday. EXAM: CHEST - 2 VIEW COMPARISON:  09/22/2020 FINDINGS: Right jugular dual lumen catheter extends into the right atrium unchanged in position. Negative for edema. Bibasilar atelectasis/scarring is unchanged. Possible small pleural effusions bilaterally. Cardiac loop recorder noted.  Stent in the left upper arm. IMPRESSION: Chronic scar/atelectasis in the bases.  No acute abnormality. Electronically Signed   By: Franchot Gallo M.D.   On: 12/08/2020 13:28   CUP PACEART REMOTE DEVICE CHECK  Result Date: 12/07/2020 ILR summary report received. Battery status  OK. Normal device function. No new symptom, tachy, brady, or pause episodes. No new AF episodes. Monthly summary reports and ROV/PRN. HB   Procedures Procedures (including critical care time)  Medications Ordered in ED Medications  0.9 %  sodium chloride infusion (Manually program via Guardrails IV Fluids) (has no administration in time range)  sodium zirconium cyclosilicate (LOKELMA) packet 10 g (has no administration in time range)  sodium chloride 0.9 % bolus 1,000 mL (1,000 mLs Intravenous New Bag/Given 12/08/20 0945)  sodium chloride 0.9 % bolus 1,000 mL (0 mLs Intravenous Stopped 12/08/20 1303)    ED Course  I have reviewed the triage vital signs and the nursing notes.  Pertinent labs & imaging results that were available during my care of the patient were reviewed by me and considered in my medical decision making (see chart for details).    MDM Rules/Calculators/A&P                          Pt's bp has improved with IVFs.  Her hemoglobin has dropped from 11.6 yesterday to 8.8 today.  I spoke with Dr. Donzetta Matters (vascular) who recommended transfusing 1 unit of blood and apply a light ace wrap and trend hemoglobins.  She does not have to go to [facility] unless her hemoglobin worsens.    Pt's K is 6.5.  She is given lokelma and d/w Dr. Moshe Cipro (nephrology).  She will put her on the list for dialysis.  Pt has had a lot of diarrhea while here.  Stool sent for stool studies.   covid test on 1/15 was negative.  Pt d/w Dr. Wynetta Emery (triad) for admission.  CRITICAL CARE Performed by: Isla Pence   Total critical care time: 45 minutes  Critical care time was exclusive of separately billable procedures and treating other patients.  Critical care was necessary to treat or prevent imminent or life-threatening deterioration.  Critical care was time spent personally by me on the following activities: development of treatment plan with patient and/or surrogate as well as nursing,  discussions with consultants, evaluation of patient's response to treatment, examination of patient, obtaining history from patient or surrogate, ordering and performing treatments and interventions, ordering and review of laboratory studies, ordering and review of radiographic studies, pulse oximetry and re-evaluation of patient's condition.  Final Clinical Impression(s) / ED Diagnoses Final diagnoses:  Hypotension, unspecified hypotension type  ESRD on hemodialysis ([place])  Hyperkalemia  Diarrhea, unspecified type  Symptomatic anemia    Rx / DC Orders ED Discharge Orders    None       Isla Pence, MD 12/08/20 1443

## 2020-12-09 DIAGNOSIS — I120 Hypertensive chronic kidney disease with stage 5 chronic kidney disease or end stage renal disease: Secondary | ICD-10-CM | POA: Diagnosis present

## 2020-12-09 DIAGNOSIS — A0472 Enterocolitis due to Clostridium difficile, not specified as recurrent: Secondary | ICD-10-CM | POA: Diagnosis present

## 2020-12-09 DIAGNOSIS — I959 Hypotension, unspecified: Secondary | ICD-10-CM | POA: Diagnosis present

## 2020-12-09 DIAGNOSIS — Z8249 Family history of ischemic heart disease and other diseases of the circulatory system: Secondary | ICD-10-CM | POA: Diagnosis not present

## 2020-12-09 DIAGNOSIS — Z8673 Personal history of transient ischemic attack (TIA), and cerebral infarction without residual deficits: Secondary | ICD-10-CM | POA: Diagnosis not present

## 2020-12-09 DIAGNOSIS — D62 Acute posthemorrhagic anemia: Secondary | ICD-10-CM | POA: Diagnosis present

## 2020-12-09 DIAGNOSIS — F419 Anxiety disorder, unspecified: Secondary | ICD-10-CM | POA: Diagnosis present

## 2020-12-09 DIAGNOSIS — Z882 Allergy status to sulfonamides status: Secondary | ICD-10-CM | POA: Diagnosis not present

## 2020-12-09 DIAGNOSIS — E8889 Other specified metabolic disorders: Secondary | ICD-10-CM | POA: Diagnosis present

## 2020-12-09 DIAGNOSIS — Z7982 Long term (current) use of aspirin: Secondary | ICD-10-CM | POA: Diagnosis not present

## 2020-12-09 DIAGNOSIS — E875 Hyperkalemia: Secondary | ICD-10-CM | POA: Diagnosis present

## 2020-12-09 DIAGNOSIS — I10 Essential (primary) hypertension: Secondary | ICD-10-CM | POA: Diagnosis not present

## 2020-12-09 DIAGNOSIS — N2581 Secondary hyperparathyroidism of renal origin: Secondary | ICD-10-CM | POA: Diagnosis present

## 2020-12-09 DIAGNOSIS — E860 Dehydration: Secondary | ICD-10-CM | POA: Diagnosis present

## 2020-12-09 DIAGNOSIS — Z888 Allergy status to other drugs, medicaments and biological substances status: Secondary | ICD-10-CM | POA: Diagnosis not present

## 2020-12-09 DIAGNOSIS — Z8711 Personal history of peptic ulcer disease: Secondary | ICD-10-CM | POA: Diagnosis not present

## 2020-12-09 DIAGNOSIS — N186 End stage renal disease: Secondary | ICD-10-CM | POA: Diagnosis present

## 2020-12-09 DIAGNOSIS — F32A Depression, unspecified: Secondary | ICD-10-CM | POA: Diagnosis present

## 2020-12-09 DIAGNOSIS — R197 Diarrhea, unspecified: Secondary | ICD-10-CM | POA: Diagnosis not present

## 2020-12-09 DIAGNOSIS — M3214 Glomerular disease in systemic lupus erythematosus: Secondary | ICD-10-CM | POA: Diagnosis present

## 2020-12-09 DIAGNOSIS — Z881 Allergy status to other antibiotic agents status: Secondary | ICD-10-CM | POA: Diagnosis not present

## 2020-12-09 DIAGNOSIS — Z992 Dependence on renal dialysis: Secondary | ICD-10-CM | POA: Diagnosis not present

## 2020-12-09 DIAGNOSIS — Z20822 Contact with and (suspected) exposure to covid-19: Secondary | ICD-10-CM | POA: Diagnosis present

## 2020-12-09 DIAGNOSIS — D696 Thrombocytopenia, unspecified: Secondary | ICD-10-CM | POA: Diagnosis present

## 2020-12-09 DIAGNOSIS — E785 Hyperlipidemia, unspecified: Secondary | ICD-10-CM | POA: Diagnosis present

## 2020-12-09 DIAGNOSIS — D631 Anemia in chronic kidney disease: Secondary | ICD-10-CM | POA: Diagnosis present

## 2020-12-09 LAB — GASTROINTESTINAL PANEL BY PCR, STOOL (REPLACES STOOL CULTURE)

## 2020-12-09 LAB — TYPE AND SCREEN
ABO/RH(D): O POS
Antibody Screen: NEGATIVE
Unit division: 0
Unit division: 0

## 2020-12-09 LAB — HEPATITIS B SURFACE ANTIGEN: Hepatitis B Surface Ag: NONREACTIVE

## 2020-12-09 LAB — CLOSTRIDIUM DIFFICILE BY PCR, REFLEXED: Toxigenic C. Difficile by PCR: POSITIVE — AB

## 2020-12-09 LAB — BPAM RBC
Blood Product Expiration Date: 202202192359
Blood Product Expiration Date: 202202192359
ISSUE DATE / TIME: 202201202304
Unit Type and Rh: 5100
Unit Type and Rh: 5100

## 2020-12-09 LAB — LACTIC ACID, PLASMA: Lactic Acid, Venous: 1.3 mmol/L (ref 0.5–1.9)

## 2020-12-09 LAB — SARS CORONAVIRUS 2 (TAT 6-24 HRS): SARS Coronavirus 2: NEGATIVE

## 2020-12-09 MED ORDER — PREDNISONE 20 MG PO TABS
50.0000 mg | ORAL_TABLET | Freq: Once | ORAL | Status: AC
  Administered 2020-12-09: 50 mg via ORAL
  Filled 2020-12-09: qty 1

## 2020-12-09 MED ORDER — RENA-VITE PO TABS
1.0000 | ORAL_TABLET | Freq: Every day | ORAL | Status: DC
  Administered 2020-12-09: 1 via ORAL
  Filled 2020-12-09: qty 1

## 2020-12-09 MED ORDER — NEPRO/CARBSTEADY PO LIQD
237.0000 mL | Freq: Two times a day (BID) | ORAL | Status: DC
  Administered 2020-12-10: 237 mL via ORAL

## 2020-12-09 MED ORDER — PREDNISONE 20 MG PO TABS
20.0000 mg | ORAL_TABLET | Freq: Every day | ORAL | Status: DC
  Administered 2020-12-10: 20 mg via ORAL
  Filled 2020-12-09: qty 1

## 2020-12-09 MED ORDER — HYDROCORTISONE NA SUCCINATE PF 100 MG IJ SOLR
100.0000 mg | Freq: Once | INTRAMUSCULAR | Status: DC
  Filled 2020-12-09: qty 2

## 2020-12-09 MED ORDER — CALCIUM ACETATE (PHOS BINDER) 667 MG PO CAPS
1334.0000 mg | ORAL_CAPSULE | Freq: Three times a day (TID) | ORAL | Status: DC
  Administered 2020-12-09 – 2020-12-10 (×4): 1334 mg via ORAL
  Filled 2020-12-09 (×5): qty 2

## 2020-12-09 MED ORDER — CHLORHEXIDINE GLUCONATE CLOTH 2 % EX PADS
6.0000 | MEDICATED_PAD | Freq: Every day | CUTANEOUS | Status: DC
  Administered 2020-12-09: 6 via TOPICAL

## 2020-12-09 NOTE — Progress Notes (Signed)
Initial Nutrition Assessment  DOCUMENTATION CODES:   Not applicable  INTERVENTION:  Nepro Shake po BID, each supplement provides 425 kcal and 19 grams protein  Rena-vit po daily QHS   NUTRITION DIAGNOSIS:   Inadequate oral intake related to poor appetite as evidenced by meal completion < 50%.   GOAL:   Patient will meet greater than or equal to 90% of their needs    MONITOR:   PO intake,Supplement acceptance,I & O's,Labs,Skin,Weight trends  REASON FOR ASSESSMENT:   Malnutrition Screening Tool    ASSESSMENT:  45 year old female with history significant for ESRD on HD, multiple failed AV fistulas s/p right upper arm looped AV graft on 1/19, cerebellar stroke, GERD, HLD, HTN, Lupus, anxiety, depression, and arthritis presented with weakness and noted to be hypotensive. Patient admitted for acute on chronic anemia secondary to right ABLA AV fistula related blood loss.  RD working remotely.  HD TTS - [facility] EDW 58 kg  Pt is s/p 1 unit PRBC on 1/20  Patient with poor appetite, per flowsheet consumed 25% of lunch today. Will order Nepro supplement as well as MVI to help her meet her needs.   Per chart, weights stable over the last 4.5 months. Currently pt is +2.5 kg of EDW s/p 1/21 HD with no volume removal.  Medications reviewed and include: Phoslo, Dificid, Solu-Cortef  1/20 labs reviewed   NUTRITION - FOCUSED PHYSICAL EXAM:  Unable to complete at this time  Diet Order:   Diet Order            Diet renal with fluid restriction Fluid restriction: 1200 mL Fluid; Room service appropriate? Yes; Fluid consistency: Thin  Diet effective now                 EDUCATION NEEDS:   No education needs have been identified at this time  Skin:  Skin Assessment: Reviewed RN Assessment  Last BM:  1/20  Height:   Ht Readings from Last 1 Encounters:  12/09/20 '5\' 4"'$  (1.626 m)    Weight:   Wt Readings from Last 1 Encounters:  12/09/20 60.5 kg    BMI:   Body mass index is 22.89 kg/m.  Estimated Nutritional Needs:   Kcal:  [id number]  Protein:  95-110  Fluid:  UOP + 1000 ml   Lajuan Lines, RD, LDN Clinical Nutrition After Hours/Weekend Pager # in [place]

## 2020-12-09 NOTE — Progress Notes (Addendum)
Patient Demographics:    [name], is a 45 y.o. female, DOB - 09/25/1976, [id number]  Admit date - 12/08/2020   Admitting Physician Christina Gintz Denton Brick, MD  Outpatient Primary MD for the patient is Mary Rio, MD  LOS - 0   Chief Complaint  Patient presents with  . Hypotension        Subjective:    [name] today has no fevers, no emesis,  No chest pain,   -Diarrhea resolving -Denies abdominal pain -No bleeding from Rt Arm AV fistula   Assessment  & Plan :    Principal Problem:   Acute blood loss anemia Active Problems:   ESRD (end stage renal disease) (HCC)   Secondary renal hyperparathyroidism (HCC)   Essential hypertension   Lupus (HCC)   Thrombocytopenia (HCC)   GERD (gastroesophageal reflux disease)   Heart palpitations   Hyperkalemia   Bilateral lower extremity edema   History of duodenal ulcer   ESRD on dialysis ([facility])   Leukocytosis   ABLA (acute blood loss anemia)  Brief Summary:-  45 y.o. female with medical history significant for ESRD on HD TTS (missed HD today), with history of multiple failed AV fistulas status post having right upper arm looped AV graft placed by Dr. Carlis Abbott on 12/07/2020, admitted on 12/08/2020 with acute blood loss anemia and hypotension   A/p 1)Acute on chronic anemia secondary to  ABLA--Rt arm AV fistula related blood loss -Hemoglobin was down to 8.8 from 11.6, patient received 1 unit of PRBC on 12/08/2020 -Plan is to repeat CBC prior to HD session on 12/10/2020 and transfuse as clinically indicated -EPO/ESA agents per nephrology team  2)ESRD-- TTS schedule, patient presented with hyperkalemia, received Kayexalate, ----last HD 12/08/2020 Next HD 12/10/2020 -Nephrology following -Discussed with Dr. Moshe Cipro who evaluated/examined right arm AV fistula site  3)Leukocytosis--??  Reactive versus infection related, patient is afebrile  and does not look toxic, repeat CBC in a.m.  4)Diarrhea---improving, GI pathogen negative, stool for C. difficile positive for antigen negative for toxin PCR pending - 5)Hypotension--POA--resolved after transfusion of 1 unit of PRBC on 12/08/2020 -Continue to hold PTA metoprolol  6) Lupus--- increase prednisone to 20 mg daily from 10 mg to cover the stress of right arm fistula surgery with bleeding and hospitalization  Disposition/Need for in-Hospital Stay- patient unable to be discharged at this time due to --hypotension, acute bleeding requiring transfusion and hyperkalemia requiring hemodialysis -Anticipate discharge home on 12/10/2020 after hemodialysis session if hemodynamically stable and does not need further transfusion*  Status is: Inpatient  Remains inpatient appropriate because:Please see above   Disposition: The patient is from: Home              Anticipated d/c is to: Home              Anticipated d/c date is: 1 day              Patient currently is not medically stable to d/c. Barriers: Not Clinically Stable-   Code Status :  -  Code Status: Full Code   Family Communication:    NA (patient is alert, awake and coherent)   Consults  :  Nephrology  DVT Prophylaxis  :   - SCDs   SCDs  Start: 12/08/20 1446    Lab Results  Component Value Date   PLT 91 (L) 12/08/2020    Inpatient Medications  Scheduled Meds: . sodium chloride   Intravenous Once  . calcium acetate  1,334 mg Oral TID WC  . Chlorhexidine Gluconate Cloth  6 each Topical Q0600  . Chlorhexidine Gluconate Cloth  6 each Topical Q0600  . fidaxomicin  200 mg Oral BID   Continuous Infusions: . sodium chloride    . sodium chloride     PRN Meds:.sodium chloride, sodium chloride, acetaminophen **OR** acetaminophen, albuterol, alteplase, fentaNYL (SUBLIMAZE) injection, heparin, ondansetron **OR** ondansetron (ZOFRAN) IV    Anti-infectives (From admission, onward)   Start     Dose/Rate Route Frequency  Ordered Stop   12/09/20 0000  fidaxomicin (DIFICID) tablet 200 mg        200 mg Oral 2 times daily 12/08/20 2307 12/18/20 2159        Objective:   Vitals:   12/09/20 0210 12/09/20 0600 12/09/20 1150 12/09/20 1342  BP: (!) 99/53 (!) 92/50 105/68 117/62  Pulse: 99 91 97 95  Resp: '18 18 18 18  '$ Temp: 99.4 F (37.4 C) 99.2 F (37.3 C) 98.6 F (37 C) 98.5 F (36.9 C)  TempSrc: Oral Oral Oral Oral  SpO2: 100% 94% 97% 100%  Weight:      Height:        Wt Readings from Last 3 Encounters:  12/09/20 60.5 kg  12/07/20 58.5 kg  11/29/20 58.1 kg     Intake/Output Summary (Last 24 hours) at 12/09/2020 1624 Last data filed at 12/09/2020 1300 Gross per 24 hour  Intake 555 ml  Output -798 ml  Net 1353 ml     Physical Exam  Gen:- Awake Alert,  In no apparent distress  HEENT:- [place].AT, No sclera icterus Neck-Supple Neck, right IJ hemodialysis catheter.  Lungs-  CTAB , fair symmetrical air movement CV- S1, S2 normal, regular  Abd-  +ve B.Sounds, Abd Soft, No tenderness,    Extremity/Skin:-  , pedal pulses present  Psych-affect is appropriate, oriented x3 Neuro-no new focal deficits, no tremors MSK-right arm AV fistula site with thrill   Data Review:   Micro Results Recent Results (from the past 240 hour(s))  SARS CORONAVIRUS 2 (TAT 6-24 HRS) Nasopharyngeal Nasopharyngeal Swab     Status: None   Collection Time: 12/03/20  2:05 PM   Specimen: Nasopharyngeal Swab  Result Value Ref Range Status   SARS Coronavirus 2 NEGATIVE NEGATIVE Final    Comment: (NOTE) SARS-CoV-2 target nucleic acids are NOT DETECTED.  The SARS-CoV-2 RNA is generally detectable in upper and lower respiratory specimens during the acute phase of infection. Negative results do not preclude SARS-CoV-2 infection, do not rule out co-infections with other pathogens, and should not be used as the sole basis for treatment or other patient management decisions. Negative results must be combined with clinical  observations, patient history, and epidemiological information. The expected result is Negative.  Fact Sheet for Patients: SugarRoll.be  Fact Sheet for Healthcare Providers: https://www.woods-mathews.com/  This test is not yet approved or cleared by the [place] FDA and  has been authorized for detection and/or diagnosis of SARS-CoV-2 by FDA under an Emergency Use Authorization (EUA). This EUA will remain  in effect (meaning this test can be used) for the duration of the COVID-19 declaration under Se ction 564(b)(1) of the Act, 21 U.S.C. section 360bbb-3(b)(1), unless the authorization is terminated or revoked sooner.  Performed at [facility], [address], [place], [place] [address]   Gastrointestinal Panel by PCR , Stool     Status: None   Collection Time: 12/08/20  2:39 PM   Specimen: Stool  Result Value Ref Range Status   Campylobacter species NOT DETECTED NOT DETECTED Final   Plesimonas shigelloides NOT DETECTED NOT DETECTED Final   Salmonella species NOT DETECTED NOT DETECTED Final   Yersinia enterocolitica NOT DETECTED NOT DETECTED Final   Vibrio species NOT DETECTED NOT DETECTED Final   Vibrio cholerae NOT DETECTED NOT DETECTED Final   Enteroaggregative E coli (EAEC) NOT DETECTED NOT DETECTED Final   Enteropathogenic E coli (EPEC) NOT DETECTED NOT DETECTED Final   Enterotoxigenic E coli (ETEC) NOT DETECTED NOT DETECTED Final   Shiga like toxin producing E coli (STEC) NOT DETECTED NOT DETECTED Final   Shigella/Enteroinvasive E coli (EIEC) NOT DETECTED NOT DETECTED Final   Cryptosporidium NOT DETECTED NOT DETECTED Final   Cyclospora cayetanensis NOT DETECTED NOT DETECTED Final   Entamoeba histolytica NOT DETECTED NOT DETECTED Final   Giardia lamblia NOT DETECTED NOT DETECTED Final   Adenovirus F40/41 NOT DETECTED NOT DETECTED Final   Astrovirus NOT DETECTED NOT DETECTED Final   Norovirus GI/GII NOT DETECTED NOT  DETECTED Final   Rotavirus A NOT DETECTED NOT DETECTED Final   Sapovirus (I, II, IV, and V) NOT DETECTED NOT DETECTED Final    Comment: Performed at [facility], [address] [place], [place] [address]  C Difficile Quick Screen (NO PCR Reflex)     Status: Abnormal   Collection Time: 12/08/20  2:39 PM   Specimen: Stool  Result Value Ref Range Status   C Diff antigen POSITIVE (A) NEGATIVE Final   C Diff toxin NEGATIVE NEGATIVE Final   C Diff interpretation   Final    Results are indeterminate. Please contact the provider listed for your campus for C diff questions in [place].    Comment: Performed at [facility], [address], [place], [place] [address]  SARS CORONAVIRUS 2 (TAT 6-24 HRS) Nasopharyngeal Nasopharyngeal Swab     Status: None   Collection Time: 12/08/20  2:42 PM   Specimen: Nasopharyngeal Swab  Result Value Ref Range Status   SARS Coronavirus 2 NEGATIVE NEGATIVE Final    Comment: (NOTE) SARS-CoV-2 target nucleic acids are NOT DETECTED.  The SARS-CoV-2 RNA is generally detectable in upper and lower respiratory specimens during the acute phase of infection. Negative results do not preclude SARS-CoV-2 infection, do not rule out co-infections with other pathogens, and should not be used as the sole basis for treatment or other patient management decisions. Negative results must be combined with clinical observations, patient history, and epidemiological information. The expected result is Negative.  Fact Sheet for Patients: SugarRoll.be  Fact Sheet for Healthcare Providers: https://www.woods-mathews.com/  This test is not yet approved or cleared by the [place] FDA and  has been authorized for detection and/or diagnosis of SARS-CoV-2 by FDA under an Emergency Use Authorization (EUA). This EUA will remain  in effect (meaning this test can be used) for the duration of the COVID-19 declaration under Se ction  564(b)(1) of the Act, 21 U.S.C. section 360bbb-3(b)(1), unless the authorization is terminated or revoked sooner.  Performed at [facility], [place] [address], [place], [place] [address]     Radiology Reports DG Chest 2 View  Result Date: 12/08/2020 CLINICAL DATA:  Weakness.  AV fistula placement yesterday. EXAM: CHEST - 2 VIEW COMPARISON:  09/22/2020 FINDINGS: Right  jugular dual lumen catheter extends into the right atrium unchanged in position. Negative for edema. Bibasilar atelectasis/scarring is unchanged. Possible small pleural effusions bilaterally. Cardiac loop recorder noted.  Stent in the left upper arm. IMPRESSION: Chronic scar/atelectasis in the bases.  No acute abnormality. Electronically Signed   By: Franchot Gallo M.D.   On: 12/08/2020 13:28   VAS US DUPLEX DIALYSIS ACCESS (AVF, AVG)  Result Date: 11/29/2020 DIALYSIS ACCESS Reason for Exam: Routine follow up. Access Site: Right Upper Extremity. Access Type: Basilic vein transposition. History: 07/01/20 Right AVF surgery. Fistulogram on 10/27/20. Comparison Study: 09/09/20 Performing Technologist: June Leap RDMS, RVT  Examination Guidelines: A complete evaluation includes B-mode imaging, spectral Doppler, color Doppler, and power Doppler as needed of all accessible portions of each vessel. Unilateral testing is considered an integral part of a complete examination. Limited examinations for reoccurring indications may be performed as noted.  Findings: +--------------------+----------+-----------------+--------+ AVF                 PSV (cm/s)Flow Vol (mL/min)Comments +--------------------+----------+-----------------+--------+ Native artery inflow    98           143                +--------------------+----------+-----------------+--------+ AVF Anastomosis        152                              +--------------------+----------+-----------------+--------+   +------------+----------+-------------+----------+-----------------------------+ OUTFLOW VEINPSV (cm/s)Diameter (cm)Depth (cm)          Describe            +------------+----------+-------------+----------+-----------------------------+ Shoulder        90        0.56        1.02                                 +------------+----------+-------------+----------+-----------------------------+ Prox UA        175        0.29        0.20                                 +------------+----------+-------------+----------+-----------------------------+ Mid UA         553        0.23        0.60    Adjacent area of hypoechoic                                                  echoes. Possibly small                                                  hematoma 0.83 cm and stenotic +------------+----------+-------------+----------+-----------------------------+ AC Fossa       523        0.22        0.53    Retained valve and stenotic  +------------+----------+-------------+----------+-----------------------------+   Summary: Patent arteriovenous fistula. Arteriovenous fistula-Stenosis noted. *See table(s) above for measurements and observations.  Diagnosing physician: Monica Martinez MD Electronically signed by Harrell Gave  Carlis Abbott MD on 11/29/2020 at 5:26:31 PM.    --------------------------------------------------------------------------------   Final    CUP PACEART REMOTE DEVICE CHECK  Result Date: 12/07/2020 ILR summary report received. Battery status OK. Normal device function. No new symptom, tachy, brady, or pause episodes. No new AF episodes. Monthly summary reports and ROV/PRN. HB    CBC Recent Labs  Lab 12/07/20 0707 12/08/20 1218  WBC  --  21.2*  HGB 11.6* 8.8*  HCT 34.0* 29.4*  PLT  --  91*  MCV  --  100.0  MCH  --  29.9  MCHC  --  29.9*  RDW  --  18.3*  LYMPHSABS  --  2.2  MONOABS  --  2.1*  EOSABS  --  0.0  BASOSABS  --  0.1    Chemistries  Recent Labs   Lab 12/07/20 0707 12/08/20 1218 12/08/20 2042  NA 137 134*  --   K 4.9 6.5* 3.1*  CL 102 100  --   CO2  --  18*  --   GLUCOSE 115* 225*  --   BUN 49* 70*  --   CREATININE 6.40* 7.61*  --   CALCIUM  --  6.9*  --    ------------------------------------------------------------------------------------------------------------------ No results for input(s): CHOL, HDL, LDLCALC, TRIG, CHOLHDL, LDLDIRECT in the last 72 hours.  Lab Results  Component Value Date   HGBA1C 7.2 (H) 09/18/2020   ------------------------------------------------------------------------------------------------------------------ No results for input(s): TSH, T4TOTAL, T3FREE, THYROIDAB in the last 72 hours.  Invalid input(s): FREET3 ------------------------------------------------------------------------------------------------------------------ No results for input(s): VITAMINB12, FOLATE, FERRITIN, TIBC, IRON, RETICCTPCT in the last 72 hours.  Coagulation profile Recent Labs  Lab 12/08/20 2042  INR 1.1    No results for input(s): DDIMER in the last 72 hours.  Cardiac Enzymes No results for input(s): CKMB, TROPONINI, MYOGLOBIN in the last 168 hours.  Invalid input(s): CK ------------------------------------------------------------------------------------------------------------------ No results found for: BNP   Roxan Hockey M.D on 12/09/2020 at 4:24 PM  Go to [email or web address] - for contact info  [facility] - Office  [phone]

## 2020-12-09 NOTE — Care Management Obs Status (Signed)
[place] NOTIFICATION   Patient Details  Name: [name] MRN: [id number] Date of Birth: 1976-02-25   Medicare Observation Status Notification Given:  Yes    Tommy Medal 12/09/2020, 3:49 PM

## 2020-12-09 NOTE — Consult Note (Signed)
Reason for Consult: To manage dialysis and dialysis related needs Referring Physician: [name] is an 45 y.o. female with history of HTN, SLE, ESRD-  HD at [facility] on TTS. She has also had issues with many failed vascular accesses -  Now s/p AVG on Wednesday 1/19 via VVS.  This procedure was complicated due to bleeding.  She presented to [facility] ER late yesterday with continued bleeding- hgb drop from 11.6 to 8.8 and a K of 6.5 in the setting of missing HD.  She was admitted - underwent HD late last night with transfusion one unit and no volume removal-  K corrected to 3.1.  She says she feels better this AM-  Requesting something for pain    Dialysis Orders: as of last admit 09/2020 [facility] TTS 3h 400/600 2K/2.5Ca EDW 58kg TDC Hep bolus 1800    Past Medical History:  Diagnosis Date  . Allergy   . Anemia   . Anxiety   . Arthritis   . Avascular necrosis of bone (HCC)    left tibial talus due to chronic prednisone use  . Cerebellar stroke ([place])   . Depression   . Dyspnea    too much  fluid  . ESRD (end stage renal disease) on dialysis ([place])    tthsat [place]  . GERD (gastroesophageal reflux disease)   . Headache(784.0)   . History of high blood pressure   . Hyperlipemia   . Hypertension   . Lupus ([place])   . Pneumonia   . Secondary hyperparathyroidism (of renal origin)     Past Surgical History:  Procedure Laterality Date  . A/V FISTULAGRAM Left 12/02/2017   Procedure: A/V FISTULAGRAM - Left upper;  Surgeon: Angelia Mould, MD;  Location: [place] CV LAB;  Service: Cardiovascular;  Laterality: Left;  . A/V FISTULAGRAM N/A 10/27/2020   Procedure: A/V FISTULAGRAM - Right Upper;  Surgeon: Cherre Robins, MD;  Location: [place] CV LAB;  Service: Cardiovascular;  Laterality: N/A;  . AV FISTULA PLACEMENT Left   . AV FISTULA PLACEMENT Right 01/26/2014   Procedure: ARTERIOVENOUS (AV) FISTULA CREATION; ultrasound guided;  Surgeon: Conrad St. Charles,  MD;  Location: [place];  Service: Vascular;  Laterality: Right;  . AV FISTULA PLACEMENT Right 04/25/2020   Procedure: RIGHT UPPER ARM  BASILIC VEIN ARTERIOVENOUS (AV) FISTULA;  Surgeon: [name], MD;  Location: [place];  Service: Vascular;  Laterality: Right;  . AV FISTULA PLACEMENT Right 12/07/2020   Procedure: INSERTION OF RIGHT UPPER ARM ARTERIOVENOUS (AV) LOOP GRAFT;  Surgeon: [name], MD;  Location: [place];  Service: Vascular;  Laterality: Right;  . BASCILIC VEIN TRANSPOSITION Right 07/01/2020   Procedure: SECOND STAGE BASCILIC VEIN TRANSPOSITION RIGHT UPPER EXTREMITY;  Surgeon: [name], MD;  Location: [place];  Service: Vascular;  Laterality: Right;  . BUBBLE STUDY  09/20/2020   Procedure: BUBBLE STUDY;  Surgeon: Thayer Headings, MD;  Location: [facility] ENDOSCOPY;  Service: Cardiovascular;;  . ESOPHAGOGASTRODUODENOSCOPY N/A 03/17/2016   Procedure: ESOPHAGOGASTRODUODENOSCOPY (EGD);  Surgeon: Irene Shipper, MD;  Location: [facility] ENDOSCOPY;  Service: Endoscopy;  Laterality: N/A;  . FISTULOGRAM Left 09/24/2019   Procedure: FISTULOGRAM LEFT ARM;  Surgeon: [name], MD;  Location: [place];  Service: Vascular;  Laterality: Left;  . HEMATOMA EVACUATION Left 10/09/2013   Procedure: EVACUATION HEMATOMA;  Surgeon: Angelia Mould, MD;  Location: [place];  Service: Vascular;  Laterality: Left;  . HEMATOMA EVACUATION Left 09/24/2019  Procedure: EVACUATION HEMATOMA  LEFT ARM;  Surgeon: [name], MD;  Location: [place];  Service: Vascular;  Laterality: Left;  . INCISION AND DRAINAGE ABSCESS     gluteal  . INSERTION OF DIALYSIS CATHETER N/A 10/09/2013   Procedure: INSERTION OF DIALYSIS CATHETER; ULTRASOUND GUIDED;  Surgeon: Angelia Mould, MD;  Location: [place];  Service: Vascular;  Laterality: N/A;  . INSERTION OF DIALYSIS CATHETER Left 09/24/2019   Procedure: INSERTION OF TUNNELLED DIALYSIS CATHETER - PALINDROME 15FR X 28CM;  Surgeon: [name], MD;   Location: [place];  Service: Vascular;  Laterality: Left;  . INSERTION OF DIALYSIS CATHETER Right 03/15/2020   Procedure: Insertion Of Dialysis Catheter;  Surgeon: Angelia Mould, MD;  Location: [place];  Service: Cardiovascular;  Laterality: Right;  . LOOP RECORDER INSERTION N/A 09/22/2020   Procedure: LOOP RECORDER INSERTION;  Surgeon: Thompson Grayer, MD;  Location: [place] CV LAB;  Service: Cardiovascular;  Laterality: N/A;  . PERIPHERAL VASCULAR BALLOON ANGIOPLASTY Left 12/02/2017   Procedure: PERIPHERAL VASCULAR BALLOON ANGIOPLASTY;  Surgeon: Angelia Mould, MD;  Location: [place] CV LAB;  Service: Cardiovascular;  Laterality: Left;  . PERIPHERAL VASCULAR BALLOON ANGIOPLASTY  10/27/2020   Procedure: PERIPHERAL VASCULAR BALLOON ANGIOPLASTY;  Surgeon: Cherre Robins, MD;  Location: [place] CV LAB;  Service: Cardiovascular;;  . REVISION OF ARTERIOVENOUS GORETEX GRAFT Left 04/03/2019   Procedure: REVISION OF ARTERIOVENOUS GORETEX GRAFT PSEUDOANEURYSM;  Surgeon: Angelia Mould, MD;  Location: [place];  Service: Vascular;  Laterality: Left;  . SHUNTOGRAM N/A 01/07/2014   Procedure: fistulogram with possibe venoplasty left upper arm avg;  Surgeon: Angelia Mould, MD;  Location: [facility] CATH LAB;  Service: Cardiovascular;  Laterality: N/A;  . TEE WITHOUT CARDIOVERSION N/A 09/20/2020   Procedure: TRANSESOPHAGEAL ECHOCARDIOGRAM (TEE);  Surgeon: Acie Fredrickson, Wonda Cheng, MD;  Location: [place];  Service: Cardiovascular;  Laterality: N/A;  . THROMBECTOMY AND REVISION OF ARTERIOVENTOUS (AV) GORETEX  GRAFT Left 03/15/2020   Procedure: ATTEMPTED THROMBECTOMY OF LEFT ARM ARTERIOVENTOUS (AV) GORETEX  GRAFT;  Surgeon: Angelia Mould, MD;  Location: [place];  Service: Cardiovascular;  Laterality: Left;  . tibiocalcaneal fusion  left Left   . ULTRASOUND GUIDANCE FOR VASCULAR ACCESS  09/24/2019   Procedure: Ultrasound Guidance For Vascular Access;  Surgeon: [name], MD;   Location: [facility] OR;  Service: Vascular;;    Family History  Problem Relation Age of Onset  . Asthma Sister   . Hypertension Mother   . Heart attack Mother 73       died at 41  . COPD Maternal Uncle        prostate  . Mental illness Neg Hx     Social History:  reports that she has never smoked. She has never used smokeless tobacco. She reports that she does not drink alcohol and does not use drugs.  Allergies:  Allergies  Allergen Reactions  . Amlodipine Hives and Swelling  . Sulfa Antibiotics Hives and Itching  . Vancomycin Hives and Itching  . Venlafaxine Other (See Comments)    Emotional    Medications: I have reviewed the patient's current medications.   Results for orders placed or performed during the hospital encounter of 12/08/20 (from the past 48 hour(s))  Basic metabolic panel     Status: Abnormal   Collection Time: 12/08/20 12:18 PM  Result Value Ref Range   Sodium 134 (L) 135 - 145 mmol/L   Potassium 6.5 (HH) 3.5 - 5.1 mmol/L  Comment: NO VISIBLE HEMOLYSIS CRITICAL RESULT CALLED TO, READ BACK BY AND VERIFIED WITH: SITES,K. RN '@1303'$  12/08/20 BILLINGSLEY,L    Chloride 100 98 - 111 mmol/L   CO2 18 (L) 22 - 32 mmol/L   Glucose, Bld 225 (H) 70 - 99 mg/dL    Comment: Glucose reference range applies only to samples taken after fasting for at least 8 hours.   BUN 70 (H) 6 - 20 mg/dL   Creatinine, Ser 7.61 (H) 0.44 - 1.00 mg/dL   Calcium 6.9 (L) 8.9 - 10.3 mg/dL   GFR, Estimated 6 (L) >60 mL/min    Comment: (NOTE) Calculated using the CKD-EPI Creatinine Equation (2021)    Anion gap 16 (H) 5 - 15    Comment: Performed at [facility], [address] [place], [place] [address]  CBC with Differential     Status: Abnormal   Collection Time: 12/08/20 12:18 PM  Result Value Ref Range   WBC 21.2 (H) 4.0 - 10.5 K/uL   RBC 2.94 (L) 3.87 - 5.11 MIL/uL   Hemoglobin 8.8 (L) 12.0 - 15.0 g/dL   HCT 29.4 (L) 36.0 - 46.0 %   MCV 100.0 80.0 - 100.0 fL   MCH 29.9 26.0 - 34.0  pg   MCHC 29.9 (L) 30.0 - 36.0 g/dL   RDW 18.3 (H) 11.5 - 15.5 %   Platelets 91 (L) 150 - 400 K/uL    Comment: PLATELET COUNT CONFIRMED BY SMEAR SPECIMEN CHECKED FOR CLOTS Immature Platelet Fraction may be clinically indicated, consider ordering this additional test [id number]    nRBC 0.4 (H) 0.0 - 0.2 %   Neutrophils Relative % 74 %   Neutro Abs 15.7 (H) 1.7 - 7.7 K/uL   Lymphocytes Relative 10 %   Lymphs Abs 2.2 0.7 - 4.0 K/uL   Monocytes Relative 10 %   Monocytes Absolute 2.1 (H) 0.1 - 1.0 K/uL   Eosinophils Relative 0 %   Eosinophils Absolute 0.0 0.0 - 0.5 K/uL   Basophils Relative 1 %   Basophils Absolute 0.1 0.0 - 0.1 K/uL   Immature Granulocytes 5 %   Abs Immature Granulocytes 0.98 (H) 0.00 - 0.07 K/uL    Comment: Performed at [facility], [address] [place], [place] [address]  Type and screen [facility]     Status: None (Preliminary result)   Collection Time: 12/08/20 12:18 PM  Result Value Ref Range   ABO/RH(D) O POS    Antibody Screen NEG    Sample Expiration 12/11/2020,2359    Unit Number [id number]    Blood Component Type RED CELLS,LR    Unit division 00    Status of Unit REL FROM [facility]    Transfusion Status OK TO TRANSFUSE    Crossmatch Result      NOT NEEDED Performed at [facility], [address], [place], [place] [address]    Unit Number [id number]    Blood Component Type RED CELLS,LR    Unit division 00    Status of Unit ISSUED    Transfusion Status OK TO TRANSFUSE    Crossmatch Result Compatible   Prepare RBC (crossmatch)     Status: None   Collection Time: 12/08/20 12:18 PM  Result Value Ref Range   Order Confirmation      ORDER PROCESSED BY BLOOD BANK Performed at [facility], [address], [place], [place] [address]   C Difficile Quick Screen (NO PCR Reflex)     Status: Abnormal   Collection Time: 12/08/20  2:39 PM   Specimen: Stool  Result Value Ref Range   C Diff antigen POSITIVE (A) NEGATIVE   C Diff toxin NEGATIVE  NEGATIVE   C Diff interpretation      Results are indeterminate. Please contact the provider listed for your campus for C diff questions in [place].    Comment: Performed at [facility], [address], [place], [place] [address]  SARS CORONAVIRUS 2 (TAT 6-24 HRS) Nasopharyngeal Nasopharyngeal Swab     Status: None   Collection Time: 12/08/20  2:42 PM   Specimen: Nasopharyngeal Swab  Result Value Ref Range   SARS Coronavirus 2 NEGATIVE NEGATIVE    Comment: (NOTE) SARS-CoV-2 target nucleic acids are NOT DETECTED.  The SARS-CoV-2 RNA is generally detectable in upper and lower respiratory specimens during the acute phase of infection. Negative results do not preclude SARS-CoV-2 infection, do not rule out co-infections with other pathogens, and should not be used as the sole basis for treatment or other patient management decisions. Negative results must be combined with clinical observations, patient history, and epidemiological information. The expected result is Negative.  Fact Sheet for Patients: SugarRoll.be  Fact Sheet for Healthcare Providers: https://www.woods-mathews.com/  This test is not yet approved or cleared by the [place] FDA and  has been authorized for detection and/or diagnosis of SARS-CoV-2 by FDA under an Emergency Use Authorization (EUA). This EUA will remain  in effect (meaning this test can be used) for the duration of the COVID-19 declaration under Se ction 564(b)(1) of the Act, 21 U.S.C. section 360bbb-3(b)(1), unless the authorization is terminated or revoked sooner.  Performed at [facility], [place] [address], [place], [place] [address]   Protime-INR     Status: None   Collection Time: 12/08/20  8:42 PM  Result Value Ref Range   Prothrombin Time 13.8 11.4 - 15.2 seconds   INR 1.1 0.8 - 1.2    Comment: (NOTE) INR goal varies based on device and disease states. Performed at [facility], [address], [place], [place] [address]   Lactic acid, plasma     Status: Abnormal   Collection Time: 12/08/20  8:42 PM  Result Value Ref Range   Lactic Acid, Venous 3.5 (HH) 0.5 - 1.9 mmol/L    Comment: CRITICAL RESULT CALLED TO, READ BACK BY AND VERIFIED WITH: H EVANS,RN'@2158'$  12/08/20 [facility] Performed at [facility], [address] [place], [place] [address]   Potassium     Status: Abnormal   Collection Time: 12/08/20  8:42 PM  Result Value Ref Range   Potassium 3.1 (L) 3.5 - 5.1 mmol/L    Comment: DELTA CHECK NOTED DIALYSIS PATIENT Performed at [facility], [address] [place], [place] [address]   Lactic acid, plasma     Status: None   Collection Time: 12/08/20 11:49 PM  Result Value Ref Range   Lactic Acid, Venous 1.3 0.5 - 1.9 mmol/L    Comment: Performed at [facility], [address] [place], [place] [address]    DG Chest 2 View  Result Date: 12/08/2020 CLINICAL DATA:  Weakness.  AV fistula placement yesterday. EXAM: CHEST - 2 VIEW COMPARISON:  09/22/2020 FINDINGS: Right jugular dual lumen catheter extends into the right atrium unchanged in position. Negative for edema. Bibasilar atelectasis/scarring is unchanged. Possible small pleural effusions bilaterally. Cardiac loop recorder noted.  Stent in the left upper arm. IMPRESSION: Chronic scar/atelectasis in the bases.  No acute abnormality. Electronically Signed   By: Franchot Gallo M.D.  On: 12/08/2020 13:28    ROS: right arm pain, diarrhea, she also notes BP has been dropping  Blood pressure (!) 92/50, pulse 91, temperature 99.2 F (37.3 C), temperature source Oral, resp. rate 18, height '5\' 4"'$  (1.626 m), weight 60.5 kg, last menstrual period 10/30/2020, SpO2 94 %. General appearance: alert and mild distress Resp: clear to auscultation bilaterally Cardio: regular rate and rhythm, S1, S2 normal, no murmur, click, rub or gallop GI: soft, non-tender; bowel sounds normal; no masses,  no organomegaly Extremities: extremities normal,  atraumatic, no cyanosis or edema right upper arm with ACE wrap-  can hear bruit -  also TDC without evidence of infection  C/o fingers being numb but she has grip strength- pain is localized to upper arm  Assessment/Plan:45 year old with ESRD-  S/p placement of new AVG on [id number] with complication of bleeding/hemotoma-  Also c diff and soft BP 1 s/p AVG on 1/19 with hematoma and dec hgb-  S/p transfusion one unit on 1/20-  Keeping lightly wrapped and rested-  Monitor hgb- pain control  2 ESRD: normally TTS at [place] for HD tomorrow if pt still here- I suspect will be 3 Hypertension: actually now if anything low BP-  Only on toprol as OP-  On hold-  Support as needed.  No peripheral edema-  Not too much over her last known EDW- OK to support with fluids if needed 4. Anemia of ESRD: now more an issue of bleeding with this surgery-  hgb dropped from 11's to 8's-  S/p transfusion one unit-  No follow up hgb yet-  Ordered daily  5. Metabolic Bone Disease: will add on phoslo 6. C diff-  Per hosps and pharmacy - on deficid due to allergy to vanc  [facility] will not physically see pt over weekend-  Will get HD on Saturday-  Call with concerns   Louis Meckel 12/09/2020, 8:06 AM

## 2020-12-09 NOTE — Progress Notes (Signed)
Pt's right arm very swollen, red, painful and warm to touch. Pt states arm and fingers of right hand are numb. Cap refill 2-3 sec. Unable to palpate thrill in new AV fistula site, + bruit with auscultation. No new drainage from right axilla incision. Pt has arm elevated up on pillows. MD Courage notified of current pt condition with right arm and also that pt has no IV site and that lab was unable to draw any blood this am due to difficult stick.

## 2020-12-09 NOTE — Progress Notes (Signed)
TRH night shift addendum.  1- Repeat lactic acid after dialysis was 1.3 and follow-up potassium is 3.1 mmol/L.  2- The patient C. difficile antigen was positive, but toxin was negative. C. difficile PCR is still pending. She has had diarrhea multiple times in the last few days. She is allergic to vancomycin and pharmacy was consulted for treatment. They have started her on fidaxomicin '200mg'$  BID x 10 days. Please see full pharmacist note for further detail.  Tennis Must, MD.

## 2020-12-09 NOTE — Progress Notes (Signed)
Pt had brown BM, pudding-like in consistency. MD Courage notified of BM but not liquid, so unable to send for c-diff. MD states understanding, order stands to be completed if pt has truly liquid stool.

## 2020-12-09 NOTE — Progress Notes (Signed)
Pharmacy note - benefits check for Dificid  Initiated a benefits check for Dificid.  [name] is a $0 copay for this medication.  No prior authorization is required.    Heide Guile, PharmD, BCPS-AQ ID Clinical Pharmacist Phone [phone]

## 2020-12-09 NOTE — Care Management Obs Status (Signed)
[place] NOTIFICATION   Patient Details  Name: BARBE THORUP MRN: [id number] Date of Birth: 11-Dec-1975   Medicare Observation Status Notification Given:       Tommy Medal 12/09/2020, 3:46 PM

## 2020-12-10 DIAGNOSIS — R197 Diarrhea, unspecified: Secondary | ICD-10-CM

## 2020-12-10 LAB — CBC
HCT: 29.1 % — ABNORMAL LOW (ref 36.0–46.0)
Hemoglobin: 8.9 g/dL — ABNORMAL LOW (ref 12.0–15.0)
MCH: 28.6 pg (ref 26.0–34.0)
MCHC: 30.6 g/dL (ref 30.0–36.0)
MCV: 93.6 fL (ref 80.0–100.0)
Platelets: 81 10*3/uL — ABNORMAL LOW (ref 150–400)
RBC: 3.11 MIL/uL — ABNORMAL LOW (ref 3.87–5.11)
RDW: 20 % — ABNORMAL HIGH (ref 11.5–15.5)
WBC: 14 10*3/uL — ABNORMAL HIGH (ref 4.0–10.5)
nRBC: 6 % — ABNORMAL HIGH (ref 0.0–0.2)

## 2020-12-10 LAB — RENAL FUNCTION PANEL
Albumin: 2.7 g/dL — ABNORMAL LOW (ref 3.5–5.0)
Anion gap: 15 (ref 5–15)
BUN: 43 mg/dL — ABNORMAL HIGH (ref 6–20)
CO2: 21 mmol/L — ABNORMAL LOW (ref 22–32)
Calcium: 7.9 mg/dL — ABNORMAL LOW (ref 8.9–10.3)
Chloride: 100 mmol/L (ref 98–111)
Creatinine, Ser: 6.33 mg/dL — ABNORMAL HIGH (ref 0.44–1.00)
GFR, Estimated: 8 mL/min — ABNORMAL LOW (ref >60)
Glucose, Bld: 255 mg/dL — ABNORMAL HIGH (ref 70–99)
Phosphorus: 7.7 mg/dL — ABNORMAL HIGH (ref 2.5–4.6)
Potassium: 4.9 mmol/L (ref 3.5–5.1)
Sodium: 136 mmol/L (ref 135–145)

## 2020-12-10 MED ORDER — FIDAXOMICIN 200 MG PO TABS
200.0000 mg | ORAL_TABLET | Freq: Two times a day (BID) | ORAL | 0 refills | Status: AC
Start: 2020-12-10 — End: ?

## 2020-12-10 MED ORDER — FIDAXOMICIN 200 MG PO TABS
200.0000 mg | ORAL_TABLET | Freq: Two times a day (BID) | ORAL | Status: DC
Start: 2020-12-10 — End: 2020-12-10
  Administered 2020-12-10: 200 mg via ORAL

## 2020-12-10 NOTE — Progress Notes (Signed)
Pt discharged via WC to POV with personal belonging in her possession

## 2020-12-10 NOTE — Progress Notes (Signed)
(  Late entry) Right arm reassessment shows decreased swelling from earlier this am, pt states fingers no longer numb. Cap refill <3 sec. Pt states arm less painful than on initial rounds this am. Still no thrill palpated but continued bruit auscultated in new AV fistula site. Pt continues with arm elevated on pillows.

## 2020-12-10 NOTE — Discharge Summary (Signed)
[facility] [facility] Discharge Summary  [name] [id number] DOB: 1975/11/28 DOA: 12/08/2020  PCP: Leeanne Rio, MD  Admit date: 12/08/2020 Discharge date: 12/10/2020  Admitted From: Home Disposition: Home  Recommendations for Outpatient Follow-up:  1. Follow up with PCP in 1-2 weeks 2. Please obtain BMP/CBC in one week 3. Follow-up at dialysis center on Tuesday as previously scheduled:  Discharge Condition: Stable CODE STATUS: Full code Diet recommendation: Heart healthy  Brief/Interim Summary: 45 year old female with history of end-stage renal disease on hemodialysis, recently had AV graft placed on 1/19 by vascular surgery.  She was admitted on 1/20 with acute blood loss anemia and hypotension.  She was found to have persistent diarrhea, leukocytosis and was found to be C. difficile positive.  She was started on oral antibiotics with improvement of diarrhea.  Discharge Diagnoses:  Principal Problem:   Acute blood loss anemia Active Problems:   ESRD (end stage renal disease) ([place])   Secondary renal hyperparathyroidism (HCC)   Essential hypertension   Lupus (HCC)   Thrombocytopenia (HCC)   GERD (gastroesophageal reflux disease)   Heart palpitations   Hyperkalemia   Bilateral lower extremity edema   History of duodenal ulcer   ESRD on dialysis (HCC)   Leukocytosis   ABLA (acute blood loss anemia)  Acute on chronic anemia secondary to blood loss.  Patient recently had right AV fistula placed in had bleeding causing blood loss.  Hemoglobin dropped from 11.6-8.8.  She received 1 unit of PRBC on 1/20.  Follow-up hemoglobin remained stable and she did not have any evidence of ongoing bleeding.  End-stage renal disease on hemodialysis.  Seen by nephrology and underwent dialysis on 12/10/2020.  She will follow-up with her primary dialysis center on Tuesday as previously scheduled  C. difficile diarrhea.  Patient reported frequent stools, abdominal pain and had significant  leukocytosis.  Stool for C. difficile antigen was positive, but toxin was negative.  PCR was also noted to be positive.  She was treated with oral Dificid since she had reported allergy to vancomycin.  She reports experiencing itching when she had taken oral vancomycin in the past.  With Dificid, she had significant improvement of diarrhea and leukocytosis has improved.  She will complete a total of 10 days.  Hypotension.  Resolved after transfusion of PRBC.  Resume metoprolol on discharge  Lupus.  On chronic prednisone therapy.  Resume home dose of 10 mg daily.  Discharge Instructions  Discharge Instructions    Diet - low sodium heart healthy   Complete by: As directed    Increase activity slowly   Complete by: As directed    No wound care   Complete by: As directed      Allergies as of 12/10/2020      Reactions   Amlodipine Hives, Swelling   Sulfa Antibiotics Hives, Itching   Vancomycin Hives, Itching   Venlafaxine Other (See Comments)   Emotional      Medication List    STOP taking these medications   acetaminophen 325 MG tablet Commonly known as: TYLENOL     TAKE these medications   aspirin 325 MG EC tablet Take 1 tablet (325 mg total) by mouth daily.   atorvastatin 40 MG tablet Commonly known as: LIPITOR Take 1 tablet (40 mg total) by mouth daily.   b complex-vitamin c-folic acid 0.8 MG Tabs tablet Take 1 tablet by mouth daily at 12 noon. What changed: when to take this   calcium acetate (Phos Binder) 667 MG/5ML Soln Commonly  known as: PHOSLYRA Take 15 mLs (2,001 mg total) by mouth 3 (three) times daily with meals. What changed:   when to take this  additional instructions   Darbepoetin Alfa 60 MCG/0.3ML Sosy injection Commonly known as: ARANESP Inject 0.3 mLs (60 mcg total) into the vein every Tuesday with hemodialysis.   fidaxomicin 200 MG Tabs tablet Commonly known as: DIFICID Take 1 tablet (200 mg total) by mouth 2 (two) times daily.    HYDROcodone-acetaminophen 5-325 MG tablet Commonly known as: Norco Take 1 tablet by mouth every 6 (six) hours as needed for moderate pain.   LUBRICATING EYE DROPS OP Place 1 drop into both eyes 2 (two) times daily as needed (dry/irritated eyes.).   metoprolol succinate 25 MG 24 hr tablet Commonly known as: TOPROL-XL Take 25 mg by mouth daily.   pantoprazole 40 MG tablet Commonly known as: PROTONIX Take 1 tablet (40 mg total) by mouth daily.   PARoxetine 40 MG tablet Commonly known as: PAXIL Take 1 tablet (40 mg total) by mouth daily.   predniSONE 10 MG tablet Commonly known as: DELTASONE Take 1 tablet (10 mg total) by mouth daily with breakfast. What changed: additional instructions   traZODone 100 MG tablet Commonly known as: DESYREL Take 1 tablet (100 mg total) by mouth at bedtime.       Allergies  Allergen Reactions  . Amlodipine Hives and Swelling  . Sulfa Antibiotics Hives and Itching  . Vancomycin Hives and Itching  . Venlafaxine Other (See Comments)    Emotional    Consultations:  Nephrology   Procedures/Studies: DG Chest 2 View  Result Date: 12/08/2020 CLINICAL DATA:  Weakness.  AV fistula placement yesterday. EXAM: CHEST - 2 VIEW COMPARISON:  09/22/2020 FINDINGS: Right jugular dual lumen catheter extends into the right atrium unchanged in position. Negative for edema. Bibasilar atelectasis/scarring is unchanged. Possible small pleural effusions bilaterally. Cardiac loop recorder noted.  Stent in the left upper arm. IMPRESSION: Chronic scar/atelectasis in the bases.  No acute abnormality. Electronically Signed   By: Franchot Gallo M.D.   On: 12/08/2020 13:28   VAS US DUPLEX DIALYSIS ACCESS (AVF, AVG)  Result Date: 11/29/2020 DIALYSIS ACCESS Reason for Exam: Routine follow up. Access Site: Right Upper Extremity. Access Type: Basilic vein transposition. History: 07/01/20 Right AVF surgery. Fistulogram on 10/27/20. Comparison Study: 09/09/20 Performing  Technologist: June Leap RDMS, RVT  Examination Guidelines: A complete evaluation includes B-mode imaging, spectral Doppler, color Doppler, and power Doppler as needed of all accessible portions of each vessel. Unilateral testing is considered an integral part of a complete examination. Limited examinations for reoccurring indications may be performed as noted.  Findings: +--------------------+----------+-----------------+--------+ AVF                 PSV (cm/s)Flow Vol (mL/min)Comments +--------------------+----------+-----------------+--------+ Native artery inflow    98           143                +--------------------+----------+-----------------+--------+ AVF Anastomosis        152                              +--------------------+----------+-----------------+--------+  +------------+----------+-------------+----------+-----------------------------+ OUTFLOW VEINPSV (cm/s)Diameter (cm)Depth (cm)          Describe            +------------+----------+-------------+----------+-----------------------------+ Shoulder        90        0.56  1.02                                 +------------+----------+-------------+----------+-----------------------------+ Prox UA        175        0.29        0.20                                 +------------+----------+-------------+----------+-----------------------------+ Mid UA         553        0.23        0.60    Adjacent area of hypoechoic                                                  echoes. Possibly small                                                  hematoma 0.83 cm and stenotic +------------+----------+-------------+----------+-----------------------------+ AC Fossa       523        0.22        0.53    Retained valve and stenotic  +------------+----------+-------------+----------+-----------------------------+   Summary: Patent arteriovenous fistula. Arteriovenous fistula-Stenosis noted. *See table(s)  above for measurements and observations.  Diagnosing physician: Monica Martinez MD Electronically signed by Monica Martinez MD on 11/29/2020 at 5:26:31 PM.    --------------------------------------------------------------------------------   Final    CUP PACEART REMOTE DEVICE CHECK  Result Date: 12/07/2020 ILR summary report received. Battery status OK. Normal device function. No new symptom, tachy, brady, or pause episodes. No new AF episodes. Monthly summary reports and ROV/PRN. HB      Subjective: Patient is feeling great today.  Reports that diarrhea has improved.  No further abdominal pain.  She is tolerating p.o. intake.  No further bleeding from AV fistula  Discharge Exam: Vitals:   12/10/20 1300 12/10/20 1330 12/10/20 1400 12/10/20 1405  BP: 126/70 128/73 122/76 124/73  Pulse: 76 79 77 76  Resp:    16  Temp:    97.9 F (36.6 C)  TempSrc:    Oral  SpO2:      Weight:      Height:        General: Pt is alert, awake, not in acute distress Cardiovascular: RRR, S1/S2 +, no rubs, no gallops Respiratory: CTA bilaterally, no wheezing, no rhonchi Abdominal: Soft, NT, ND, bowel sounds + Extremities: no edema, no cyanosis    The results of significant diagnostics from this hospitalization (including imaging, microbiology, ancillary and laboratory) are listed below for reference.     Microbiology: Recent Results (from the past 240 hour(s))  SARS CORONAVIRUS 2 (TAT 6-24 HRS) Nasopharyngeal Nasopharyngeal Swab     Status: None   Collection Time: 12/03/20  2:05 PM   Specimen: Nasopharyngeal Swab  Result Value Ref Range Status   SARS Coronavirus 2 NEGATIVE NEGATIVE Final    Comment: (NOTE) SARS-CoV-2 target nucleic acids are NOT DETECTED.  The SARS-CoV-2 RNA is generally detectable in upper and lower respiratory specimens during the acute phase of infection. Negative results do not  preclude SARS-CoV-2 infection, do not rule out co-infections with other pathogens, and  should not be used as the sole basis for treatment or other patient management decisions. Negative results must be combined with clinical observations, patient history, and epidemiological information. The expected result is Negative.  Fact Sheet for Patients: SugarRoll.be  Fact Sheet for Healthcare Providers: https://www.woods-mathews.com/  This test is not yet approved or cleared by the [place] FDA and  has been authorized for detection and/or diagnosis of SARS-CoV-2 by FDA under an Emergency Use Authorization (EUA). This EUA will remain  in effect (meaning this test can be used) for the duration of the COVID-19 declaration under Se ction 564(b)(1) of the Act, 21 U.S.C. section 360bbb-3(b)(1), unless the authorization is terminated or revoked sooner.  Performed at [facility], [place] [address] [place], [place] [address]   Gastrointestinal Panel by PCR , Stool     Status: None   Collection Time: 12/08/20  2:39 PM   Specimen: Stool  Result Value Ref Range Status   Campylobacter species NOT DETECTED NOT DETECTED Final   Plesimonas shigelloides NOT DETECTED NOT DETECTED Final   Salmonella species NOT DETECTED NOT DETECTED Final   Yersinia enterocolitica NOT DETECTED NOT DETECTED Final   Vibrio species NOT DETECTED NOT DETECTED Final   Vibrio cholerae NOT DETECTED NOT DETECTED Final   Enteroaggregative E coli (EAEC) NOT DETECTED NOT DETECTED Final   Enteropathogenic E coli (EPEC) NOT DETECTED NOT DETECTED Final   Enterotoxigenic E coli (ETEC) NOT DETECTED NOT DETECTED Final   Shiga like toxin producing E coli (STEC) NOT DETECTED NOT DETECTED Final   Shigella/Enteroinvasive E coli (EIEC) NOT DETECTED NOT DETECTED Final   Cryptosporidium NOT DETECTED NOT DETECTED Final   Cyclospora cayetanensis NOT DETECTED NOT DETECTED Final   Entamoeba histolytica NOT DETECTED NOT DETECTED Final   Giardia lamblia NOT DETECTED NOT DETECTED Final    Adenovirus F40/41 NOT DETECTED NOT DETECTED Final   Astrovirus NOT DETECTED NOT DETECTED Final   Norovirus GI/GII NOT DETECTED NOT DETECTED Final   Rotavirus A NOT DETECTED NOT DETECTED Final   Sapovirus (I, II, IV, and V) NOT DETECTED NOT DETECTED Final    Comment: Performed at [facility], [address] [place], [place] [address]  C Difficile Quick Screen (NO PCR Reflex)     Status: Abnormal   Collection Time: 12/08/20  2:39 PM   Specimen: Stool  Result Value Ref Range Status   C Diff antigen POSITIVE (A) NEGATIVE Final   C Diff toxin NEGATIVE NEGATIVE Final   C Diff interpretation   Final    Results are indeterminate. Please contact the provider listed for your campus for C diff questions in [place].    Comment: Performed at [facility], [address] [place], [place] [address]  SARS CORONAVIRUS 2 (TAT 6-24 HRS) Nasopharyngeal Nasopharyngeal Swab     Status: None   Collection Time: 12/08/20  2:42 PM   Specimen: Nasopharyngeal Swab  Result Value Ref Range Status   SARS Coronavirus 2 NEGATIVE NEGATIVE Final    Comment: (NOTE) SARS-CoV-2 target nucleic acids are NOT DETECTED.  The SARS-CoV-2 RNA is generally detectable in upper and lower respiratory specimens during the acute phase of infection. Negative results do not preclude SARS-CoV-2 infection, do not rule out co-infections with other pathogens, and should not be used as the sole basis for treatment or other patient management decisions. Negative results must be combined with clinical observations, patient history, and epidemiological information. The  expected result is Negative.  Fact Sheet for Patients: SugarRoll.be  Fact Sheet for Healthcare Providers: https://www.woods-mathews.com/  This test is not yet approved or cleared by the [place] FDA and  has been authorized for detection and/or diagnosis of SARS-CoV-2 by FDA under an Emergency Use Authorization  (EUA). This EUA will remain  in effect (meaning this test can be used) for the duration of the COVID-19 declaration under Se ction 564(b)(1) of the Act, 21 U.S.C. section 360bbb-3(b)(1), unless the authorization is terminated or revoked sooner.  Performed at [facility], [place] [address], [place], [place] [address]   C. Diff by PCR     Status: Abnormal   Collection Time: 12/09/20  2:26 PM   Specimen: STOOL  Result Value Ref Range Status   Toxigenic C. Difficile by PCR POSITIVE (A) NEGATIVE Final    Comment: Positive for toxigenic C. difficile with little to no toxin production. Only treat if clinical presentation suggests symptomatic illness. Performed at [facility], [place] [address] [place], [place] [address]      Labs: BNP (last 3 results) No results for input(s): BNP in the last 8760 hours. Basic Metabolic Panel: Recent Labs  Lab 12/07/20 0707 12/08/20 1218 12/08/20 2042 12/10/20 0500  NA 137 134*  --  136  K 4.9 6.5* 3.1* 4.9  CL 102 100  --  100  CO2  --  18*  --  21*  GLUCOSE 115* 225*  --  255*  BUN 49* 70*  --  43*  CREATININE 6.40* 7.61*  --  6.33*  CALCIUM  --  6.9*  --  7.9*  PHOS  --   --   --  7.7*   Liver Function Tests: Recent Labs  Lab 12/10/20 0500  ALBUMIN 2.7*   No results for input(s): LIPASE, AMYLASE in the last 168 hours. No results for input(s): AMMONIA in the last 168 hours. CBC: Recent Labs  Lab 12/07/20 0707 12/08/20 1218 12/10/20 0500  WBC  --  21.2* 14.0*  NEUTROABS  --  15.7*  --   HGB 11.6* 8.8* 8.9*  HCT 34.0* 29.4* 29.1*  MCV  --  100.0 93.6  PLT  --  91* 81*   Cardiac Enzymes: No results for input(s): CKTOTAL, CKMB, CKMBINDEX, TROPONINI in the last 168 hours. BNP: Invalid input(s): POCBNP CBG: No results for input(s): GLUCAP in the last 168 hours. D-Dimer No results for input(s): DDIMER in the last 72 hours. Hgb A1c No results for input(s): HGBA1C in the last 72 hours. Lipid Profile No results for  input(s): CHOL, HDL, LDLCALC, TRIG, CHOLHDL, LDLDIRECT in the last 72 hours. Thyroid function studies No results for input(s): TSH, T4TOTAL, T3FREE, THYROIDAB in the last 72 hours.  Invalid input(s): FREET3 Anemia work up No results for input(s): VITAMINB12, FOLATE, FERRITIN, TIBC, IRON, RETICCTPCT in the last 72 hours. Urinalysis    Component Value Date/Time   COLORURINE YELLOW 06/11/2011 0959   APPEARANCEUR TURBID (A) 06/11/2011 0959   LABSPEC 1.025 06/11/2011 0959   PHURINE 5.0 06/11/2011 0959   GLUCOSEU NEGATIVE 06/11/2011 0959   HGBUR MODERATE (A) 06/11/2011 0959   BILIRUBINUR NEGATIVE 06/11/2011 0959   KETONESUR NEGATIVE 06/11/2011 0959   PROTEINUR 100 (A) 06/11/2011 0959   UROBILINOGEN 0.2 06/11/2011 0959   NITRITE NEGATIVE 06/11/2011 0959   LEUKOCYTESUR TRACE (A) 06/11/2011 0959   Sepsis Labs Invalid input(s): PROCALCITONIN,  WBC,  LACTICIDVEN Microbiology Recent Results (from the past 240 hour(s))  SARS CORONAVIRUS 2 (TAT  6-24 HRS) Nasopharyngeal Nasopharyngeal Swab     Status: None   Collection Time: 12/03/20  2:05 PM   Specimen: Nasopharyngeal Swab  Result Value Ref Range Status   SARS Coronavirus 2 NEGATIVE NEGATIVE Final    Comment: (NOTE) SARS-CoV-2 target nucleic acids are NOT DETECTED.  The SARS-CoV-2 RNA is generally detectable in upper and lower respiratory specimens during the acute phase of infection. Negative results do not preclude SARS-CoV-2 infection, do not rule out co-infections with other pathogens, and should not be used as the sole basis for treatment or other patient management decisions. Negative results must be combined with clinical observations, patient history, and epidemiological information. The expected result is Negative.  Fact Sheet for Patients: SugarRoll.be  Fact Sheet for Healthcare Providers: https://www.woods-mathews.com/  This test is not yet approved or cleared by the [place]  FDA and  has been authorized for detection and/or diagnosis of SARS-CoV-2 by FDA under an Emergency Use Authorization (EUA). This EUA will remain  in effect (meaning this test can be used) for the duration of the COVID-19 declaration under Se ction 564(b)(1) of the Act, 21 U.S.C. section 360bbb-3(b)(1), unless the authorization is terminated or revoked sooner.  Performed at [facility], [place] [address], [place], [place] [address]   Gastrointestinal Panel by PCR , Stool     Status: None   Collection Time: 12/08/20  2:39 PM   Specimen: Stool  Result Value Ref Range Status   Campylobacter species NOT DETECTED NOT DETECTED Final   Plesimonas shigelloides NOT DETECTED NOT DETECTED Final   Salmonella species NOT DETECTED NOT DETECTED Final   Yersinia enterocolitica NOT DETECTED NOT DETECTED Final   Vibrio species NOT DETECTED NOT DETECTED Final   Vibrio cholerae NOT DETECTED NOT DETECTED Final   Enteroaggregative E coli (EAEC) NOT DETECTED NOT DETECTED Final   Enteropathogenic E coli (EPEC) NOT DETECTED NOT DETECTED Final   Enterotoxigenic E coli (ETEC) NOT DETECTED NOT DETECTED Final   Shiga like toxin producing E coli (STEC) NOT DETECTED NOT DETECTED Final   Shigella/Enteroinvasive E coli (EIEC) NOT DETECTED NOT DETECTED Final   Cryptosporidium NOT DETECTED NOT DETECTED Final   Cyclospora cayetanensis NOT DETECTED NOT DETECTED Final   Entamoeba histolytica NOT DETECTED NOT DETECTED Final   Giardia lamblia NOT DETECTED NOT DETECTED Final   Adenovirus F40/41 NOT DETECTED NOT DETECTED Final   Astrovirus NOT DETECTED NOT DETECTED Final   Norovirus GI/GII NOT DETECTED NOT DETECTED Final   Rotavirus A NOT DETECTED NOT DETECTED Final   Sapovirus (I, II, IV, and V) NOT DETECTED NOT DETECTED Final    Comment: Performed at [facility], [address] [place], [place] [address]  C Difficile Quick Screen (NO PCR Reflex)     Status: Abnormal   Collection Time: 12/08/20  2:39 PM    Specimen: Stool  Result Value Ref Range Status   C Diff antigen POSITIVE (A) NEGATIVE Final   C Diff toxin NEGATIVE NEGATIVE Final   C Diff interpretation   Final    Results are indeterminate. Please contact the provider listed for your campus for C diff questions in [place].    Comment: Performed at [facility], [address], [place], [place] [address]  SARS CORONAVIRUS 2 (TAT 6-24 HRS) Nasopharyngeal Nasopharyngeal Swab     Status: None   Collection Time: 12/08/20  2:42 PM   Specimen: Nasopharyngeal Swab  Result Value Ref Range Status   SARS Coronavirus 2 NEGATIVE NEGATIVE Final  Comment: (NOTE) SARS-CoV-2 target nucleic acids are NOT DETECTED.  The SARS-CoV-2 RNA is generally detectable in upper and lower respiratory specimens during the acute phase of infection. Negative results do not preclude SARS-CoV-2 infection, do not rule out co-infections with other pathogens, and should not be used as the sole basis for treatment or other patient management decisions. Negative results must be combined with clinical observations, patient history, and epidemiological information. The expected result is Negative.  Fact Sheet for Patients: SugarRoll.be  Fact Sheet for Healthcare Providers: https://www.woods-mathews.com/  This test is not yet approved or cleared by the [place] FDA and  has been authorized for detection and/or diagnosis of SARS-CoV-2 by FDA under an Emergency Use Authorization (EUA). This EUA will remain  in effect (meaning this test can be used) for the duration of the COVID-19 declaration under Se ction 564(b)(1) of the Act, 21 U.S.C. section 360bbb-3(b)(1), unless the authorization is terminated or revoked sooner.  Performed at [facility], [place] [address] [place], [place] [address]   C. Diff by PCR     Status: Abnormal   Collection Time: 12/09/20  2:26 PM   Specimen: STOOL  Result Value Ref Range Status    Toxigenic C. Difficile by PCR POSITIVE (A) NEGATIVE Final    Comment: Positive for toxigenic C. difficile with little to no toxin production. Only treat if clinical presentation suggests symptomatic illness. Performed at [facility], [place] [address] [place],  [address]      Time coordinating discharge: 42mns  SIGNED:   JKathie Dike MD  [facility] 12/10/2020, 6:50 PM   If 7PM-7AM, please contact night-coverage [email or web address]

## 2020-12-10 NOTE — Progress Notes (Signed)
Pharmacy Antibiotic Note  [name] is a 45 y.o. female admitted on 12/08/2020 with Cdiff - first occurrence. Pt has allergy to vancomycin (hives/itching).  Pharmacy has been consulted for alternative medication dosing. D/w Onnie Boer (ID pharmacist) and will use fidaxomicin - informed Dr. Olevia Bowens of plan.  1/20 Cdiff: antigen positive/toxin negative - pt with symptoms (+watery diarrhea), continues as of 1/22. Cdiff PCR positive   Plan: Fidaxomicin '200mg'$  BID x 10 days   Height: '5\' 4"'$  (162.6 cm) Weight: 62.6 kg (138 lb 0.1 oz) IBW/kg (Calculated) : 54.7  Temp (24hrs), Avg:98.3 F (36.8 C), Min:97.6 F (36.4 C), Max:99 F (37.2 C)  Recent Labs  Lab 12/07/20 0707 12/08/20 1218 12/08/20 2042 12/08/20 2349 12/10/20 0500  WBC  --  21.2*  --   --  14.0*  CREATININE 6.40* 7.61*  --   --  6.33*  LATICACIDVEN  --   --  3.5* 1.3  --     Estimated Creatinine Clearance: 9.8 mL/min (A) (by C-G formula based on SCr of 6.33 mg/dL (H)).    Allergies  Allergen Reactions  . Amlodipine Hives and Swelling  . Sulfa Antibiotics Hives and Itching  . Vancomycin Hives and Itching  . Venlafaxine Other (See Comments)    Emotional    Antimicrobials this admission: 1/20 Fidaxomicin >>1/30  Microbiology results: 1/20 BCx:  1/20 Cdiff: antigen positive/toxin negative  1/20 GI PCR: positive   Thank you for allowing pharmacy to be a part of this patient's care.  Thomasenia Sales, PharmD, MBA, BCGP Clinical Pharmacist  12/10/2020 11:47 AM

## 2020-12-10 NOTE — Progress Notes (Signed)
Pt's dialysis treatment completed. MD Memon in to see patient. Pt denies c/o.

## 2020-12-10 NOTE — Procedures (Signed)
   HEMODIALYSIS TREATMENT NOTE:  3.5 hour heparin-free HD completed via right chest wall TDC.  Goal met: 1 liter removed.  Hemodynamically stable.  All blood was returned.  Right upper extremity with 1+ edema.  No bleeding from incision.  Weak, but palpable thrill in AVG.  States diarrhea is improving.  "I feel great."  No changes from pre-HD assessment.  Mary Alexandria, RN

## 2020-12-12 ENCOUNTER — Other Ambulatory Visit: Payer: Medicare Other | Admitting: Adult Health

## 2020-12-14 ENCOUNTER — Inpatient Hospital Stay: Payer: Medicare Other | Admitting: Neurology

## 2020-12-15 ENCOUNTER — Emergency Department (HOSPITAL_COMMUNITY): Payer: Medicare Other

## 2020-12-15 ENCOUNTER — Encounter (HOSPITAL_COMMUNITY): Payer: Self-pay | Admitting: Emergency Medicine

## 2020-12-15 ENCOUNTER — Emergency Department (HOSPITAL_COMMUNITY)
Admission: EM | Admit: 2020-12-15 | Discharge: 2020-12-15 | Disposition: A | Discharge status: Home / Self Care | Payer: Medicare Other | Source: Home / Self Care | Attending: Emergency Medicine | Admitting: Emergency Medicine

## 2020-12-15 ENCOUNTER — Other Ambulatory Visit: Payer: Self-pay

## 2020-12-15 DIAGNOSIS — Z7982 Long term (current) use of aspirin: Secondary | ICD-10-CM | POA: Insufficient documentation

## 2020-12-15 DIAGNOSIS — R2233 Localized swelling, mass and lump, upper limb, bilateral: Secondary | ICD-10-CM | POA: Insufficient documentation

## 2020-12-15 DIAGNOSIS — N186 End stage renal disease: Secondary | ICD-10-CM | POA: Insufficient documentation

## 2020-12-15 DIAGNOSIS — I120 Hypertensive chronic kidney disease with stage 5 chronic kidney disease or end stage renal disease: Secondary | ICD-10-CM | POA: Insufficient documentation

## 2020-12-15 DIAGNOSIS — R5383 Other fatigue: Secondary | ICD-10-CM | POA: Insufficient documentation

## 2020-12-15 DIAGNOSIS — Z20822 Contact with and (suspected) exposure to covid-19: Secondary | ICD-10-CM | POA: Insufficient documentation

## 2020-12-15 DIAGNOSIS — R531 Weakness: Secondary | ICD-10-CM | POA: Insufficient documentation

## 2020-12-15 DIAGNOSIS — Z992 Dependence on renal dialysis: Secondary | ICD-10-CM | POA: Insufficient documentation

## 2020-12-15 DIAGNOSIS — Z79899 Other long term (current) drug therapy: Secondary | ICD-10-CM | POA: Insufficient documentation

## 2020-12-15 DIAGNOSIS — R609 Edema, unspecified: Secondary | ICD-10-CM | POA: Diagnosis not present

## 2020-12-15 DIAGNOSIS — T827XXA Infection and inflammatory reaction due to other cardiac and vascular devices, implants and grafts, initial encounter: Secondary | ICD-10-CM | POA: Diagnosis not present

## 2020-12-15 LAB — CBC WITH DIFFERENTIAL/PLATELET
Abs Immature Granulocytes: 0.8 10*3/uL — ABNORMAL HIGH (ref 0.00–0.07)
Basophils Absolute: 0 10*3/uL (ref 0.0–0.1)
Basophils Relative: 0 %
Eosinophils Absolute: 0.4 10*3/uL (ref 0.0–0.5)
Eosinophils Relative: 2 %
HCT: 30.5 % — ABNORMAL LOW (ref 36.0–46.0)
Hemoglobin: 9.4 g/dL — ABNORMAL LOW (ref 12.0–15.0)
Lymphocytes Relative: 10 %
Lymphs Abs: 1.9 10*3/uL (ref 0.7–4.0)
MCH: 29.6 pg (ref 26.0–34.0)
MCHC: 30.8 g/dL (ref 30.0–36.0)
MCV: 95.9 fL (ref 80.0–100.0)
Monocytes Absolute: 0.9 10*3/uL (ref 0.1–1.0)
Monocytes Relative: 5 %
Myelocytes: 4 %
Neutro Abs: 14.9 10*3/uL — ABNORMAL HIGH (ref 1.7–7.7)
Neutrophils Relative %: 79 %
Platelets: 86 10*3/uL — ABNORMAL LOW (ref 150–400)
RBC: 3.18 MIL/uL — ABNORMAL LOW (ref 3.87–5.11)
RDW: 21.5 % — ABNORMAL HIGH (ref 11.5–15.5)
WBC: 18.8 10*3/uL — ABNORMAL HIGH (ref 4.0–10.5)
nRBC: 42.1 % — ABNORMAL HIGH (ref 0.0–0.2)
nRBC: 55 /100 WBC — ABNORMAL HIGH

## 2020-12-15 LAB — BASIC METABOLIC PANEL
Anion gap: 19 — ABNORMAL HIGH (ref 5–15)
BUN: 59 mg/dL — ABNORMAL HIGH (ref 6–20)
CO2: 20 mmol/L — ABNORMAL LOW (ref 22–32)
Calcium: 7.3 mg/dL — ABNORMAL LOW (ref 8.9–10.3)
Chloride: 99 mmol/L (ref 98–111)
Creatinine, Ser: 7.7 mg/dL — ABNORMAL HIGH (ref 0.44–1.00)
GFR, Estimated: 6 mL/min — ABNORMAL LOW (ref >60)
Glucose, Bld: 169 mg/dL — ABNORMAL HIGH (ref 70–99)
Potassium: 4.7 mmol/L (ref 3.5–5.1)
Sodium: 138 mmol/L (ref 135–145)

## 2020-12-15 LAB — HEPATIC FUNCTION PANEL
ALT: 26 U/L (ref 0–44)
AST: 41 U/L (ref 15–41)
Albumin: 2.9 g/dL — ABNORMAL LOW (ref 3.5–5.0)
Alkaline Phosphatase: 355 U/L — ABNORMAL HIGH (ref 38–126)
Bilirubin, Direct: 0.7 mg/dL — ABNORMAL HIGH (ref 0.0–0.2)
Indirect Bilirubin: 1.3 mg/dL — ABNORMAL HIGH (ref 0.3–0.9)
Total Bilirubin: 2 mg/dL — ABNORMAL HIGH (ref 0.3–1.2)
Total Protein: 5.3 g/dL — ABNORMAL LOW (ref 6.5–8.1)

## 2020-12-15 LAB — SARS CORONAVIRUS 2 BY RT PCR (HOSPITAL ORDER, PERFORMED IN ~~LOC~~ HOSPITAL LAB): SARS Coronavirus 2: NEGATIVE

## 2020-12-15 LAB — LACTIC ACID, PLASMA: Lactic Acid, Venous: 2.8 mmol/L (ref 0.5–1.9)

## 2020-12-15 NOTE — ED Triage Notes (Signed)
Patient coming from home. Patient reports feeling generally weak x2 weeks. Patient states she did not feel well enough to go to dialysis today. Patient is [name], Sat dialysis patient. Last full treatment Tuesday. NAD.

## 2020-12-15 NOTE — ED Provider Notes (Addendum)
[facility] EMERGENCY DEPARTMENT Provider Note   CSN: [id number] Arrival date & time: 12/15/20  0734     History Chief Complaint  Patient presents with  . Weakness  . Arm Swelling    [name] is a 45 y.o. female.  HPI   45 year old female with past medical history of end-stage renal disease on hemodialysis every TRS, HTN, HLD presents the emergency department generalized weakness.  Patient states she was recently admitted at [facility] for diarrhea.  Documentation shows that she was admitted with anemia of GI loss with C. difficile infection, treated with p.o. medications, symptoms improved and resolved, she is now having solid stools.  She states she has been more fatigued and weak than usual.  Was supposed to go to dialysis today but because she was so weak she wanted to come in and get checked out.  Denies any acute symptoms including fever, headache, chest pain, shortness of breath, abdominal pain.  She does not make urine.  Past Medical History:  Diagnosis Date  . Allergy   . Anemia   . Anxiety   . Arthritis   . Avascular necrosis of bone (HCC)    left tibial talus due to chronic prednisone use  . Cerebellar stroke ([place])   . Depression   . Dyspnea    too much  fluid  . ESRD (end stage renal disease) on dialysis ([place])    tthsat [place]  . GERD (gastroesophageal reflux disease)   . Headache(784.0)   . History of high blood pressure   . Hyperlipemia   . Hypertension   . Lupus ([place])   . Pneumonia   . Secondary hyperparathyroidism (of renal origin)     Patient Active Problem List   Diagnosis Date Noted  . ABLA (acute blood loss anemia) 12/09/2020  . Leukocytosis 12/08/2020  . 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 ESRD on dialysis ([place])   . Steroid-induced hyperglycemia   . Avascular necrosis ([place])   . Anemia   . Dizziness   . Nausea   . CVA (cerebral vascular accident) ([place]) 09/17/2020  .  Cerebellar stroke ([place])   . CAP (community acquired pneumonia) due to Chlamydia species 06/08/2020  . AV fistula occlusion ([place]) 03/16/2020  . AV fistula occlusion, initial encounter ([place]) 03/14/2020  . Hyperkalemia   . Hematoma of arm, left, initial encounter 09/25/2019  . AV graft malfunction ([place]) 09/24/2019  . Mild dysplasia of cervix (CIN I) bx provern 10/2018 11/26/2018  . Bilateral lower extremity edema 10/22/2018  . Pre-transplant evaluation for kidney transplant 08/22/2018  . Anxiety and depression 04/16/2018  . Difficulty sleeping 04/16/2018  . Heart palpitations 11/20/2017  . History of duodenal ulcer 10/21/2017  . Metabolic disorder [id number]  . Osteopathy in diseases classified elsewhere, unspecified site 09/20/2017  . GERD (gastroesophageal reflux disease) 07/10/2016  . Acute blood loss anemia 03/17/2016  . Thrombocytopenia ([place]) 03/17/2016  . Duodenal ulcer hemorrhagic   . Esophageal stricture   . Adjustment disorder with mixed anxiety and depressed mood 10/25/2015  . Lupus ([place]) 04/29/2015  . ESRD (end stage renal disease) ([place]) 10/09/2013  . Anemia in chronic kidney disease 10/09/2013  . Secondary renal hyperparathyroidism ([place]) 10/09/2013  . Essential hypertension 10/09/2013    Past Surgical History:  Procedure Laterality Date  . A/V FISTULAGRAM Left 12/02/2017   Procedure: A/V FISTULAGRAM - Left upper;  Surgeon: Angelia Mould, MD;  Location: [place] [facility];  Service: Cardiovascular;  Laterality:  Left;  . A/V FISTULAGRAM N/A 10/27/2020   Procedure: A/V FISTULAGRAM - Right Upper;  Surgeon: Cherre Robins, MD;  Location: [place] [facility];  Service: Cardiovascular;  Laterality: N/A;  . AV FISTULA PLACEMENT Left   . AV FISTULA PLACEMENT Right 01/26/2014   Procedure: ARTERIOVENOUS (AV) FISTULA CREATION; ultrasound guided;  Surgeon: Conrad Hyde, MD;  Location: [place];  Service: Vascular;  Laterality: Right;  . AV FISTULA PLACEMENT Right 04/25/2020    Procedure: RIGHT UPPER ARM  BASILIC VEIN ARTERIOVENOUS (AV) FISTULA;  Surgeon: [name], MD;  Location: [place];  Service: Vascular;  Laterality: Right;  . AV FISTULA PLACEMENT Right 12/07/2020   Procedure: INSERTION OF RIGHT UPPER ARM ARTERIOVENOUS (AV) LOOP GRAFT;  Surgeon: [name], MD;  Location: [place];  Service: Vascular;  Laterality: Right;  . BASCILIC VEIN TRANSPOSITION Right 07/01/2020   Procedure: SECOND STAGE BASCILIC VEIN TRANSPOSITION RIGHT UPPER EXTREMITY;  Surgeon: [name], MD;  Location: [place];  Service: Vascular;  Laterality: Right;  . BUBBLE STUDY  09/20/2020   Procedure: BUBBLE STUDY;  Surgeon: Thayer Headings, MD;  Location: [facility] ENDOSCOPY;  Service: Cardiovascular;;  . ESOPHAGOGASTRODUODENOSCOPY N/A 03/17/2016   Procedure: ESOPHAGOGASTRODUODENOSCOPY (EGD);  Surgeon: Irene Shipper, MD;  Location: [facility] ENDOSCOPY;  Service: Endoscopy;  Laterality: N/A;  . FISTULOGRAM Left 09/24/2019   Procedure: FISTULOGRAM LEFT ARM;  Surgeon: [name], MD;  Location: [place];  Service: Vascular;  Laterality: Left;  . HEMATOMA EVACUATION Left 10/09/2013   Procedure: EVACUATION HEMATOMA;  Surgeon: Angelia Mould, MD;  Location: [place];  Service: Vascular;  Laterality: Left;  . HEMATOMA EVACUATION Left 09/24/2019   Procedure: EVACUATION HEMATOMA  LEFT ARM;  Surgeon: [name], MD;  Location: [place];  Service: Vascular;  Laterality: Left;  . INCISION AND DRAINAGE ABSCESS     gluteal  . INSERTION OF DIALYSIS CATHETER N/A 10/09/2013   Procedure: INSERTION OF DIALYSIS CATHETER; ULTRASOUND GUIDED;  Surgeon: Angelia Mould, MD;  Location: [place];  Service: Vascular;  Laterality: N/A;  . INSERTION OF DIALYSIS CATHETER Left 09/24/2019   Procedure: INSERTION OF TUNNELLED DIALYSIS CATHETER - PALINDROME 15FR X 28CM;  Surgeon: [name], MD;  Location: [place];  Service: Vascular;  Laterality: Left;  . INSERTION OF DIALYSIS CATHETER Right  03/15/2020   Procedure: Insertion Of Dialysis Catheter;  Surgeon: Angelia Mould, MD;  Location: [place];  Service: Cardiovascular;  Laterality: Right;  . LOOP RECORDER INSERTION N/A 09/22/2020   Procedure: LOOP RECORDER INSERTION;  Surgeon: Thompson Grayer, MD;  Location: [place] CV [facility];  Service: Cardiovascular;  Laterality: N/A;  . PERIPHERAL VASCULAR BALLOON ANGIOPLASTY Left 12/02/2017   Procedure: PERIPHERAL VASCULAR BALLOON ANGIOPLASTY;  Surgeon: Angelia Mould, MD;  Location: [place] CV LAB;  Service: Cardiovascular;  Laterality: Left;  . PERIPHERAL VASCULAR BALLOON ANGIOPLASTY  10/27/2020   Procedure: PERIPHERAL VASCULAR BALLOON ANGIOPLASTY;  Surgeon: Cherre Robins, MD;  Location: [place] CV LAB;  Service: Cardiovascular;;  . REVISION OF ARTERIOVENOUS GORETEX GRAFT Left 04/03/2019   Procedure: REVISION OF ARTERIOVENOUS GORETEX GRAFT PSEUDOANEURYSM;  Surgeon: Angelia Mould, MD;  Location: [place];  Service: Vascular;  Laterality: Left;  . SHUNTOGRAM N/A 01/07/2014   Procedure: fistulogram with possibe venoplasty left upper arm avg;  Surgeon: Angelia Mould, MD;  Location: [facility] CATH LAB;  Service: Cardiovascular;  Laterality: N/A;  . TEE WITHOUT CARDIOVERSION N/A 09/20/2020   Procedure: TRANSESOPHAGEAL ECHOCARDIOGRAM (TEE);  Surgeon: Acie Fredrickson,  Wonda Cheng, MD;  Location: [place];  Service: Cardiovascular;  Laterality: N/A;  . THROMBECTOMY AND REVISION OF ARTERIOVENTOUS (AV) GORETEX  GRAFT Left 03/15/2020   Procedure: ATTEMPTED THROMBECTOMY OF LEFT ARM ARTERIOVENTOUS (AV) GORETEX  GRAFT;  Surgeon: Angelia Mould, MD;  Location: [place];  Service: Cardiovascular;  Laterality: Left;  . tibiocalcaneal fusion  left Left   . ULTRASOUND GUIDANCE FOR VASCULAR ACCESS  09/24/2019   Procedure: Ultrasound Guidance For Vascular Access;  Surgeon: [name], MD;  Location: [facility] OR;  Service: Vascular;;     OB History    Gravida  0   Para  0   Term  0    Preterm  0   AB  0   Living  0     SAB  0   IAB  0   Ectopic  0   Multiple  0   Live Births  0           Family History  Problem Relation Age of Onset  . Asthma Sister   . Hypertension Mother   . Heart attack Mother 59       died at 61  . COPD Maternal Uncle        prostate  . Mental illness Neg Hx     Social History   Tobacco Use  . Smoking status: Never Smoker  . Smokeless tobacco: Never Used  Vaping Use  . Vaping Use: Never used  Substance Use Topics  . Alcohol use: No  . Drug use: No    Home Medications Prior to Admission medications   Medication Sig Start Date End Date Taking? Authorizing Provider  aspirin EC 325 MG EC tablet Take 1 tablet (325 mg total) by mouth daily. 09/23/20   Gifford Shave, MD  atorvastatin (LIPITOR) 40 MG tablet Take 1 tablet (40 mg total) by mouth daily. 09/29/20   Angiulli, Lavon Paganini, PA-C  b complex-vitamin c-folic acid (NEPHRO-VITE) 0.8 MG TABS tablet Take 1 tablet by mouth daily at 12 noon. Patient taking differently: Take 1 tablet by mouth daily. 09/29/20   Angiulli, Lavon Paganini, PA-C  calcium acetate, Phos Binder, (PHOSLYRA) 667 MG/5ML SOLN Take 15 mLs (2,001 mg total) by mouth 3 (three) times daily with meals. Patient taking differently: Take 2,001 mg by mouth See admin instructions. Take 15 mls (2001 mg) by mouth 3 times daily with meals & with each snacks. 09/29/20   Angiulli, Lavon Paganini, PA-C  Carboxymethylcellul-Glycerin (LUBRICATING EYE DROPS OP) Place 1 drop into both eyes 2 (two) times daily as needed (dry/irritated eyes.).     [provider]  clopidogrel (PLAVIX) 75 MG tablet Take 75 mg by mouth daily.    [provider]  Darbepoetin Alfa (ARANESP) 60 MCG/0.3ML SOSY injection Inject 0.3 mLs (60 mcg total) into the vein every Tuesday with hemodialysis. 10/04/20   Angiulli, Lavon Paganini, PA-C  diltiazem (DILACOR XR) 240 MG 24 hr capsule Take 240 mg by mouth daily.    [provider]  fidaxomicin  (DIFICID) 200 MG TABS tablet Take 1 tablet (200 mg total) by mouth 2 (two) times daily. 12/10/20   Kathie Dike, MD  hydrALAZINE (APRESOLINE) 25 MG tablet Take 25 mg by mouth 2 (two) times daily.    [provider]  HYDROcodone-acetaminophen (NORCO) 5-325 MG tablet Take 1 tablet by mouth every 6 (six) hours as needed for moderate pain. 12/07/20   Dagoberto Ligas, PA-C  meclizine (ANTIVERT) 25 MG tablet Take 25 mg by mouth  3 (three) times daily as needed for dizziness or nausea.    [provider]  metoprolol succinate (TOPROL-XL) 25 MG 24 hr tablet Take 25 mg by mouth daily. 11/01/20   [provider]  ondansetron (ZOFRAN-ODT) 4 MG disintegrating tablet Take 4 mg by mouth daily as needed for nausea or vomiting.    [provider]  pantoprazole (PROTONIX) 40 MG tablet Take 1 tablet (40 mg total) by mouth daily. 09/29/20   Angiulli, Lavon Paganini, PA-C  PARoxetine (PAXIL) 40 MG tablet Take 1 tablet (40 mg total) by mouth daily. 09/29/20   Angiulli, Lavon Paganini, PA-C  predniSONE (DELTASONE) 10 MG tablet Take 1 tablet (10 mg total) by mouth daily with breakfast. Patient taking differently: Take 10 mg by mouth daily with breakfast. For Lupus 09/29/20   Angiulli, Lavon Paganini, PA-C  traZODone (DESYREL) 100 MG tablet Take 1 tablet (100 mg total) by mouth at bedtime. Patient not taking: Reported on 12/15/2020 09/29/20   Angiulli, Lavon Paganini, PA-C  traZODone (DESYREL) 50 MG tablet Take 50 mg by mouth at bedtime.    [provider]    Allergies    Amlodipine, Sulfa antibiotics, Vancomycin, and Venlafaxine  Review of Systems   Review of Systems  Constitutional: Positive for activity change, appetite change and fatigue. Negative for chills and fever.  HENT: Negative for congestion.   Eyes: Negative for visual disturbance.  Respiratory: Negative for shortness of breath.   Cardiovascular: Negative for chest pain.  Gastrointestinal: Negative for abdominal pain, diarrhea and  vomiting.  Genitourinary: Negative for dysuria.  Skin: Negative for rash.  Neurological: Negative for headaches.    Physical Exam Updated Vital Signs BP 100/72   Pulse 68   Temp (!) 97.5 F (36.4 C) (Oral)   Resp (!) 22   Ht '5\' 4"'$  (1.626 m)   Wt 59 kg   LMP 11/09/2020 (Within Weeks)   SpO2 100%   BMI 22.31 kg/m   Physical Exam Vitals and nursing note reviewed.  Constitutional:      Appearance: Normal appearance.  HENT:     Head: Normocephalic.     Mouth/Throat:     Mouth: Mucous membranes are moist.  Cardiovascular:     Rate and Rhythm: Normal rate.  Pulmonary:     Effort: Pulmonary effort is normal. No respiratory distress.  Abdominal:     Palpations: Abdomen is soft.     Tenderness: There is no abdominal tenderness.  Musculoskeletal:     Comments: Chronic edematous bilateral upper extremities, right AV fistula  Skin:    General: Skin is warm.  Neurological:     Mental Status: She is alert and oriented to person, place, and time. Mental status is at baseline.  Psychiatric:        Mood and Affect: Mood normal.     ED Results / Procedures / Treatments   Labs (all labs ordered are listed, but only abnormal results are displayed) Labs Reviewed  CBC WITH DIFFERENTIAL/PLATELET - Abnormal; Notable for the following components:      Result Value   WBC 18.8 (*)    RBC 3.18 (*)    Hemoglobin 9.4 (*)    HCT 30.5 (*)    RDW 21.5 (*)    Platelets 86 (*)    nRBC 42.1 (*)    Neutro Abs 14.9 (*)    nRBC 55 (*)    Abs Immature Granulocytes 0.80 (*)    All other components within normal limits  BASIC METABOLIC PANEL -  Abnormal; Notable for the following components:   CO2 20 (*)    Glucose, Bld 169 (*)    BUN 59 (*)    Creatinine, Ser 7.70 (*)    Calcium 7.3 (*)    GFR, Estimated 6 (*)    Anion gap 19 (*)    All other components within normal limits  LACTIC ACID, PLASMA - Abnormal; Notable for the following components:   Lactic Acid, Venous 2.8 (*)    All other  components within normal limits  HEPATIC FUNCTION PANEL - Abnormal; Notable for the following components:   Total Protein 5.3 (*)    Albumin 2.9 (*)    Alkaline Phosphatase 355 (*)    Total Bilirubin 2.0 (*)    Bilirubin, Direct 0.7 (*)    Indirect Bilirubin 1.3 (*)    All other components within normal limits  SARS CORONAVIRUS 2 BY RT PCR (HOSPITAL ORDER, [facility])  LACTIC ACID, PLASMA  PATHOLOGIST SMEAR REVIEW    EKG None  Radiology DG Chest Port 1 View  Result Date: 12/15/2020 CLINICAL DATA:  Renal failure. Hypertension. Systemic lupus erythematosus EXAM: PORTABLE CHEST 1 VIEW COMPARISON:  December 08, 2020 FINDINGS: There is scarring in the lung bases bilaterally. Lungs elsewhere are clear. Heart size and pulmonary vascularity are normal. Central catheter tip is in the right atrium, unchanged. There is a loop recorder on the left. A stent is noted in the left brachial region. There is sclerosis in each humeral head, likely due to bilateral avascular necrosis in these areas. No adenopathy evident. IMPRESSION: Scarring in the lung bases. No edema or airspace opacity. Stable cardiac silhouette. Central catheter tip in right atrium. Apparent avascular necrosis in each humeral head. Electronically Signed   By: Lowella Grip III M.D.   On: 12/15/2020 11:05    Procedures Procedures   Medications Ordered in ED Medications - No data to display  ED Course  I have reviewed the triage vital signs and the nursing notes.  Pertinent labs & imaging results that were available during my care of the patient were reviewed by me and considered in my medical decision making (see chart for details).    MDM Rules/Calculators/A&P                          45 year old female presents the emergency department for evaluation of weakness.  She is sitting up, well-appearing, conversational, very pleasant.  BP high 90s to low [id number] systolic. Normal HR, no fever. Does not  satisfy SIRS criteria. Recent admission for anemia and C. difficile infection.  She is now having solid stools.  Was supposed to get dialysis today but felt weak and wanted to get evaluated.  No other acute symptoms.  EKG is unchanged from previous.  Chest x-ray shows no abnormal findings or acute fluid overload.  Blood work shows a leukocytosis (down from 21 at her recent admission), baseline anemia, kidney function that is expected predialysis without any acute electrolyte abnormalities, baseline hypocalcemia she has supplements for. Lactic acid was slightly elevated, low suspicion for sepsis, her white count is elevated but downtrending from her initial count at recent admission, she has no acute or infectious complaints, abdomen is soft and non tender, Covid is negative, patient does not make urine, no other noted sources of infection at this evaluation. Blood culture sent for possible occult sepsis. She is afebrile, no acute infectious symptoms, I do not think she needs antibiotics.  On reevaluation patient states that she feels improved and that she does feel well enough to go home.  She will call her dialysis center to reschedule her session.  Patient will be discharged and treated as an outpatient.  Discharge plan and strict return to ED precautions discussed, patient verbalizes understanding and agreement.  Final Clinical Impression(s) / ED Diagnoses Final diagnoses:  Weakness    Rx / DC Orders ED Discharge Orders    None       Lorelle Gibbs, DO 12/15/20 1443    Austen Wygant, Alvin Critchley, DO 12/15/20 1500

## 2020-12-15 NOTE — Discharge Instructions (Addendum)
You have been seen and discharged from the emergency department.  Follow-up with your primary provider for reevaluation, call dialysis center for rescheduling of your session. Take home medications as prescribed. If you have any worsening symptoms or further concerns for health please return to an emergency department for further evaluation.

## 2020-12-16 LAB — PATHOLOGIST SMEAR REVIEW

## 2020-12-17 ENCOUNTER — Inpatient Hospital Stay (HOSPITAL_COMMUNITY)
Admission: EM | Admit: 2020-12-17 | Discharge: 2020-12-20 | DRG: 314 | Disposition: E | Payer: Medicare Other | Attending: Internal Medicine | Admitting: Internal Medicine

## 2020-12-17 ENCOUNTER — Encounter (HOSPITAL_COMMUNITY): Payer: Self-pay | Admitting: Emergency Medicine

## 2020-12-17 ENCOUNTER — Emergency Department (HOSPITAL_COMMUNITY): Payer: Medicare Other

## 2020-12-17 ENCOUNTER — Other Ambulatory Visit: Payer: Self-pay

## 2020-12-17 DIAGNOSIS — M329 Systemic lupus erythematosus, unspecified: Secondary | ICD-10-CM | POA: Diagnosis present

## 2020-12-17 DIAGNOSIS — N186 End stage renal disease: Secondary | ICD-10-CM | POA: Diagnosis not present

## 2020-12-17 DIAGNOSIS — D849 Immunodeficiency, unspecified: Secondary | ICD-10-CM | POA: Diagnosis present

## 2020-12-17 DIAGNOSIS — Y848 Other medical procedures as the cause of abnormal reaction of the patient, or of later complication, without mention of misadventure at the time of the procedure: Secondary | ICD-10-CM | POA: Diagnosis not present

## 2020-12-17 DIAGNOSIS — M879 Osteonecrosis, unspecified: Secondary | ICD-10-CM | POA: Diagnosis present

## 2020-12-17 DIAGNOSIS — Z888 Allergy status to other drugs, medicaments and biological substances status: Secondary | ICD-10-CM

## 2020-12-17 DIAGNOSIS — Z881 Allergy status to other antibiotic agents status: Secondary | ICD-10-CM

## 2020-12-17 DIAGNOSIS — J9811 Atelectasis: Secondary | ICD-10-CM | POA: Diagnosis present

## 2020-12-17 DIAGNOSIS — J9601 Acute respiratory failure with hypoxia: Secondary | ICD-10-CM | POA: Diagnosis present

## 2020-12-17 DIAGNOSIS — B179 Acute viral hepatitis, unspecified: Secondary | ICD-10-CM | POA: Diagnosis present

## 2020-12-17 DIAGNOSIS — Z7952 Long term (current) use of systemic steroids: Secondary | ICD-10-CM

## 2020-12-17 DIAGNOSIS — K219 Gastro-esophageal reflux disease without esophagitis: Secondary | ICD-10-CM | POA: Diagnosis present

## 2020-12-17 DIAGNOSIS — E875 Hyperkalemia: Secondary | ICD-10-CM | POA: Diagnosis present

## 2020-12-17 DIAGNOSIS — I959 Hypotension, unspecified: Secondary | ICD-10-CM | POA: Diagnosis present

## 2020-12-17 DIAGNOSIS — R748 Abnormal levels of other serum enzymes: Secondary | ICD-10-CM | POA: Diagnosis present

## 2020-12-17 DIAGNOSIS — I4901 Ventricular fibrillation: Secondary | ICD-10-CM | POA: Diagnosis not present

## 2020-12-17 DIAGNOSIS — D72829 Elevated white blood cell count, unspecified: Secondary | ICD-10-CM | POA: Diagnosis not present

## 2020-12-17 DIAGNOSIS — I469 Cardiac arrest, cause unspecified: Secondary | ICD-10-CM | POA: Diagnosis not present

## 2020-12-17 DIAGNOSIS — Z992 Dependence on renal dialysis: Secondary | ICD-10-CM

## 2020-12-17 DIAGNOSIS — Z882 Allergy status to sulfonamides status: Secondary | ICD-10-CM

## 2020-12-17 DIAGNOSIS — D696 Thrombocytopenia, unspecified: Secondary | ICD-10-CM | POA: Diagnosis present

## 2020-12-17 DIAGNOSIS — E860 Dehydration: Secondary | ICD-10-CM | POA: Diagnosis present

## 2020-12-17 DIAGNOSIS — D631 Anemia in chronic kidney disease: Secondary | ICD-10-CM | POA: Diagnosis present

## 2020-12-17 DIAGNOSIS — N2581 Secondary hyperparathyroidism of renal origin: Secondary | ICD-10-CM | POA: Diagnosis present

## 2020-12-17 DIAGNOSIS — Y832 Surgical operation with anastomosis, bypass or graft as the cause of abnormal reaction of the patient, or of later complication, without mention of misadventure at the time of the procedure: Secondary | ICD-10-CM | POA: Diagnosis present

## 2020-12-17 DIAGNOSIS — R188 Other ascites: Secondary | ICD-10-CM | POA: Diagnosis present

## 2020-12-17 DIAGNOSIS — R609 Edema, unspecified: Secondary | ICD-10-CM

## 2020-12-17 DIAGNOSIS — T827XXA Infection and inflammatory reaction due to other cardiac and vascular devices, implants and grafts, initial encounter: Principal | ICD-10-CM | POA: Diagnosis present

## 2020-12-17 DIAGNOSIS — A419 Sepsis, unspecified organism: Secondary | ICD-10-CM | POA: Diagnosis present

## 2020-12-17 DIAGNOSIS — R7989 Other specified abnormal findings of blood chemistry: Secondary | ICD-10-CM | POA: Diagnosis present

## 2020-12-17 DIAGNOSIS — R6521 Severe sepsis with septic shock: Secondary | ICD-10-CM | POA: Diagnosis present

## 2020-12-17 DIAGNOSIS — A4181 Sepsis due to Enterococcus: Secondary | ICD-10-CM | POA: Diagnosis present

## 2020-12-17 DIAGNOSIS — G4700 Insomnia, unspecified: Secondary | ICD-10-CM | POA: Diagnosis present

## 2020-12-17 DIAGNOSIS — T8182XA Emphysema (subcutaneous) resulting from a procedure, initial encounter: Secondary | ICD-10-CM | POA: Diagnosis not present

## 2020-12-17 DIAGNOSIS — Z8673 Personal history of transient ischemic attack (TIA), and cerebral infarction without residual deficits: Secondary | ICD-10-CM

## 2020-12-17 DIAGNOSIS — E785 Hyperlipidemia, unspecified: Secondary | ICD-10-CM | POA: Diagnosis present

## 2020-12-17 DIAGNOSIS — Z95828 Presence of other vascular implants and grafts: Secondary | ICD-10-CM

## 2020-12-17 DIAGNOSIS — R7401 Elevation of levels of liver transaminase levels: Secondary | ICD-10-CM

## 2020-12-17 DIAGNOSIS — L03113 Cellulitis of right upper limb: Secondary | ICD-10-CM | POA: Diagnosis present

## 2020-12-17 DIAGNOSIS — R531 Weakness: Secondary | ICD-10-CM

## 2020-12-17 DIAGNOSIS — Z8249 Family history of ischemic heart disease and other diseases of the circulatory system: Secondary | ICD-10-CM

## 2020-12-17 DIAGNOSIS — L8990 Pressure ulcer of unspecified site, unspecified stage: Secondary | ICD-10-CM | POA: Insufficient documentation

## 2020-12-17 DIAGNOSIS — Z7902 Long term (current) use of antithrombotics/antiplatelets: Secondary | ICD-10-CM

## 2020-12-17 DIAGNOSIS — E872 Acidosis: Secondary | ICD-10-CM | POA: Diagnosis present

## 2020-12-17 DIAGNOSIS — F32A Depression, unspecified: Secondary | ICD-10-CM | POA: Diagnosis present

## 2020-12-17 DIAGNOSIS — Z7982 Long term (current) use of aspirin: Secondary | ICD-10-CM

## 2020-12-17 DIAGNOSIS — Z79899 Other long term (current) drug therapy: Secondary | ICD-10-CM

## 2020-12-17 DIAGNOSIS — B952 Enterococcus as the cause of diseases classified elsewhere: Secondary | ICD-10-CM | POA: Diagnosis present

## 2020-12-17 DIAGNOSIS — U071 COVID-19: Secondary | ICD-10-CM | POA: Diagnosis present

## 2020-12-17 DIAGNOSIS — I120 Hypertensive chronic kidney disease with stage 5 chronic kidney disease or end stage renal disease: Secondary | ICD-10-CM | POA: Diagnosis present

## 2020-12-17 LAB — COMPREHENSIVE METABOLIC PANEL
ALT: 53 U/L — ABNORMAL HIGH (ref 0–44)
AST: 113 U/L — ABNORMAL HIGH (ref 15–41)
Albumin: 3.1 g/dL — ABNORMAL LOW (ref 3.5–5.0)
Alkaline Phosphatase: 385 U/L — ABNORMAL HIGH (ref 38–126)
Anion gap: 18 — ABNORMAL HIGH (ref 5–15)
BUN: 51 mg/dL — ABNORMAL HIGH (ref 6–20)
CO2: 18 mmol/L — ABNORMAL LOW (ref 22–32)
Calcium: 7.4 mg/dL — ABNORMAL LOW (ref 8.9–10.3)
Chloride: 99 mmol/L (ref 98–111)
Creatinine, Ser: 6.51 mg/dL — ABNORMAL HIGH (ref 0.44–1.00)
GFR, Estimated: 8 mL/min — ABNORMAL LOW (ref >60)
Glucose, Bld: 128 mg/dL — ABNORMAL HIGH (ref 70–99)
Potassium: 5.3 mmol/L — ABNORMAL HIGH (ref 3.5–5.1)
Sodium: 135 mmol/L (ref 135–145)
Total Bilirubin: 2.8 mg/dL — ABNORMAL HIGH (ref 0.3–1.2)
Total Protein: 5.8 g/dL — ABNORMAL LOW (ref 6.5–8.1)

## 2020-12-17 LAB — CBC WITH DIFFERENTIAL/PLATELET
Abs Immature Granulocytes: 0.61 10*3/uL — ABNORMAL HIGH (ref 0.00–0.07)
Basophils Absolute: 0.1 10*3/uL (ref 0.0–0.1)
Basophils Relative: 1 %
Eosinophils Absolute: 0 10*3/uL (ref 0.0–0.5)
Eosinophils Relative: 0 %
HCT: 32 % — ABNORMAL LOW (ref 36.0–46.0)
Hemoglobin: 10.1 g/dL — ABNORMAL LOW (ref 12.0–15.0)
Immature Granulocytes: 3 %
Lymphocytes Relative: 4 %
Lymphs Abs: 0.8 10*3/uL (ref 0.7–4.0)
MCH: 30.8 pg (ref 26.0–34.0)
MCHC: 31.6 g/dL (ref 30.0–36.0)
MCV: 97.6 fL (ref 80.0–100.0)
Monocytes Absolute: 2.8 10*3/uL — ABNORMAL HIGH (ref 0.1–1.0)
Monocytes Relative: 14 %
Neutro Abs: 15.6 10*3/uL — ABNORMAL HIGH (ref 1.7–7.7)
Neutrophils Relative %: 78 %
Platelets: 87 10*3/uL — ABNORMAL LOW (ref 150–400)
RBC: 3.28 MIL/uL — ABNORMAL LOW (ref 3.87–5.11)
RDW: 23.8 % — ABNORMAL HIGH (ref 11.5–15.5)
WBC: 20 10*3/uL — ABNORMAL HIGH (ref 4.0–10.5)
nRBC: 33.9 % — ABNORMAL HIGH (ref 0.0–0.2)

## 2020-12-17 LAB — PROCALCITONIN: Procalcitonin: 7.27 ng/mL

## 2020-12-17 MED ORDER — ACETAMINOPHEN 650 MG RE SUPP: 650.0000 mg | Freq: Four times a day (QID) | RECTAL | Status: DC | PRN

## 2020-12-17 MED ORDER — SODIUM CHLORIDE 0.9 % IV SOLN
240.0000 mg | Freq: Once | INTRAVENOUS | Status: AC
  Administered 2020-12-17: 240 mg via INTRAVENOUS
  Filled 2020-12-17: qty 4.8

## 2020-12-17 MED ORDER — ONDANSETRON HCL 4 MG/2ML IJ SOLN
4.0000 mg | Freq: Four times a day (QID) | INTRAMUSCULAR | Status: DC | PRN
  Administered 2020-12-18 (×2): 4 mg via INTRAVENOUS
  Filled 2020-12-17 (×2): qty 2

## 2020-12-17 MED ORDER — CALCIUM ACETATE (PHOS BINDER) 667 MG/5ML PO SOLN: 2001.0000 mg | ORAL | Status: DC

## 2020-12-17 MED ORDER — IBUPROFEN 400 MG PO TABS
400.0000 mg | ORAL_TABLET | Freq: Once | ORAL | Status: AC
  Administered 2020-12-17: 400 mg via ORAL
  Filled 2020-12-17: qty 1

## 2020-12-17 MED ORDER — SODIUM CHLORIDE 0.9 % IV BOLUS
500.0000 mL | Freq: Once | INTRAVENOUS | Status: AC
  Administered 2020-12-17: 500 mL via INTRAVENOUS

## 2020-12-17 MED ORDER — ONDANSETRON HCL 4 MG PO TABS: 4.0000 mg | ORAL_TABLET | Freq: Four times a day (QID) | ORAL | Status: DC | PRN

## 2020-12-17 MED ORDER — TRAZODONE HCL 50 MG PO TABS
50.0000 mg | ORAL_TABLET | Freq: Every day | ORAL | Status: DC
  Administered 2020-12-17: 50 mg via ORAL
  Filled 2020-12-17: qty 1

## 2020-12-17 MED ORDER — SODIUM CHLORIDE 0.9% FLUSH: 10.0000 mL | INTRAVENOUS | Status: DC | PRN

## 2020-12-17 MED ORDER — PHENYLEPHRINE HCL-NACL 10-0.9 MG/250ML-% IV SOLN
25.0000 ug/min | INTRAVENOUS | Status: DC
  Administered 2020-12-17: 25 ug/min via INTRAVENOUS
  Administered 2020-12-18: 200 ug/min via INTRAVENOUS
  Filled 2020-12-17: qty 500
  Filled 2020-12-17 (×2): qty 250
  Filled 2020-12-17: qty 500

## 2020-12-17 MED ORDER — CALCIUM ACETATE (PHOS BINDER) 667 MG PO CAPS
2001.0000 mg | ORAL_CAPSULE | Freq: Three times a day (TID) | ORAL | Status: DC
  Administered 2020-12-17 – 2020-12-18 (×3): 2001 mg via ORAL
  Filled 2020-12-17 (×6): qty 3

## 2020-12-17 MED ORDER — HYDROCODONE-ACETAMINOPHEN 5-325 MG PO TABS: 1.0000 | ORAL_TABLET | Freq: Four times a day (QID) | ORAL | Status: DC | PRN

## 2020-12-17 MED ORDER — PREDNISONE 10 MG PO TABS
10.0000 mg | ORAL_TABLET | Freq: Every day | ORAL | Status: DC
  Administered 2020-12-18: 10 mg via ORAL
  Filled 2020-12-17: qty 1

## 2020-12-17 MED ORDER — SODIUM CHLORIDE 0.9 % IV SOLN
250.0000 mL | INTRAVENOUS | Status: DC
  Administered 2020-12-17: 250 mL via INTRAVENOUS

## 2020-12-17 MED ORDER — PANTOPRAZOLE SODIUM 40 MG PO TBEC
40.0000 mg | DELAYED_RELEASE_TABLET | Freq: Every day | ORAL | Status: DC
  Administered 2020-12-17 – 2020-12-18 (×2): 40 mg via ORAL
  Filled 2020-12-17 (×2): qty 1

## 2020-12-17 MED ORDER — SODIUM CHLORIDE 0.9% FLUSH: 3.0000 mL | INTRAVENOUS | Status: DC | PRN

## 2020-12-17 MED ORDER — SODIUM CHLORIDE 0.9 % IV SOLN: 250.0000 mL | INTRAVENOUS | Status: DC | PRN

## 2020-12-17 MED ORDER — MIDODRINE HCL 5 MG PO TABS
10.0000 mg | ORAL_TABLET | Freq: Three times a day (TID) | ORAL | Status: DC
  Administered 2020-12-17 – 2020-12-18 (×3): 10 mg via ORAL
  Filled 2020-12-17 (×3): qty 2

## 2020-12-17 MED ORDER — ACETAMINOPHEN 325 MG PO TABS
650.0000 mg | ORAL_TABLET | Freq: Four times a day (QID) | ORAL | Status: DC | PRN
  Administered 2020-12-17: 650 mg via ORAL
  Filled 2020-12-17: qty 2

## 2020-12-17 MED ORDER — SODIUM CHLORIDE 0.9% FLUSH: 3.0000 mL | Freq: Two times a day (BID) | INTRAVENOUS | Status: DC

## 2020-12-17 MED ORDER — SODIUM CHLORIDE 0.9% FLUSH
10.0000 mL | Freq: Two times a day (BID) | INTRAVENOUS | Status: DC
  Administered 2020-12-18: 10 mL

## 2020-12-17 MED ORDER — CALCIUM ACETATE (PHOS BINDER) 667 MG PO CAPS
2001.0000 mg | ORAL_CAPSULE | Freq: Three times a day (TID) | ORAL | Status: DC
  Administered 2020-12-17: 2001 mg via ORAL
  Filled 2020-12-17 (×4): qty 3

## 2020-12-17 MED ORDER — CHLORHEXIDINE GLUCONATE CLOTH 2 % EX PADS
6.0000 | MEDICATED_PAD | Freq: Every day | CUTANEOUS | Status: DC
  Administered 2020-12-18: 6 via TOPICAL

## 2020-12-17 MED ORDER — PAROXETINE HCL 20 MG PO TABS
40.0000 mg | ORAL_TABLET | Freq: Every day | ORAL | Status: DC
  Administered 2020-12-17 – 2020-12-18 (×2): 40 mg via ORAL
  Filled 2020-12-17 (×2): qty 2

## 2020-12-17 MED ORDER — SODIUM CHLORIDE 0.9 % IV SOLN: 250.0000 mL | INTRAVENOUS | Status: DC

## 2020-12-17 NOTE — ED Notes (Signed)
Called [facility] for Daptomycin

## 2020-12-17 NOTE — Progress Notes (Signed)
Pharmacy Note: daptomycin  Pharmacy consulted to dose vancomycin for this 45 yo female for cellulitis. Upon further investigation, patient has an allergy to vancomycin and hasn't received it at a [facility] facility. Provider was contacted and order was changed to daptomycin.  Estimated Creatinine Clearance: 9.5 mL/min (A) (by C-G formula based on SCr of 6.51 mg/dL (H)).   Allergies  Allergen Reactions  . Amlodipine Hives and Swelling  . Sulfa Antibiotics Hives and Itching  . Vancomycin Hives and Itching  . Venlafaxine Other (See Comments)    Emotional    Vitals:   12/18/2020 1600 12/18/2020 1630  BP: (!) 80/60 (!) 81/55  Pulse: 63   Resp: 15 15  Temp:    SpO2: 99%     Anti-infectives (From admission, onward)   Start     Dose/Rate Route Frequency Ordered Stop   11/29/2020 1800  DAPTOmycin (CUBICIN) 240 mg in sodium chloride 0.9 % IVPB        240 mg 209.6 mL/hr over 30 Minutes Intravenous  Once 12/14/2020 1642        Antimicrobials this admission:  daptomycin 1/29 >>     Dose adjustments this admission:  daptomycin  Microbiology results:  1/29 [facility] x2:    1/29 Resp PCR: SARS CoV-2: 1/21 C. Diff: positive    Plan: Initial dose(s) of daptomycin '240mg'$  X 1 dose ordered  F/U admission orders for further dosing if therapy continued.  Despina Pole, [facility] 12/13/2020 4:45 PM

## 2020-12-17 NOTE — Op Note (Signed)
Patient:  [name]  DOB:  22-Apr-1976  MRN:  [id number]   Preop Diagnosis: End-stage renal disease, need for IV access, sepsis  Postop Diagnosis: Same  Procedure: Central line placement  Surgeon: Aviva Signs, MD  Anes: Local  Indications: Patient is a 45 year old black female with end-stage renal disease on dialysis as well as sepsis who is in need of central venous access.  The risks and benefits of the procedure including bleeding and infection were fully explained to the patient, who gave informed consent.  Procedure note: The procedure was done at bedside in room 19 of the emergency room.  Surgical site confirmation was performed.  1% Xylocaine was used for local anesthesia.  The right groin area was prepped with ChloraPrep.  A needle was advanced into the right femoral vein using the Seldinger technique without difficulty.  The guidewire was then advanced.  A triple-lumen catheter was then inserted and the guidewire removed.  Good backflow of venous blood was noted on aspiration of all 3 ports.  All 3 ports were flushed with saline.  It was secured in place using 3-0 silk sutures.  A dry sterile dressing was applied.  The patient tolerated the procedure well.  Complications: None  EBL: Minimal  Specimen: None

## 2020-12-17 NOTE — Progress Notes (Addendum)
9:17PM RN called due to patient's BP persistently continued to be in hypotensive range (73/56) despite fluid bolus and midodrine.  Patient was placed in Trendelenburg position.  She was alert and oriented x3.  Patient was started on Neo-Synephrine source of being able to maintain a minimum goal MAP of 65, IV NS 250 mL x 1 bolus was given..  We shall continue to monitor patient and treat accordingly.  12:45AM (1/30) Pressure was changed to IV Levophed due to continue soft BP on Neo-Synephrine.  1:12AM SARS coronavirus 2 was positive No indication for Remdesivir at this time due to O2 sat at 99-100% on room air

## 2020-12-17 NOTE — Consult Note (Signed)
[facility]  INPATIENT CONSULTATION  Reason for Consultation: ESRD Requesting Provider: Dr. Manuella Ghazi  HPI: [name] is an 45 y.o. 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   She had a new RUE AVG placed with Dr. Carlis Abbott 12/07/20; op note shows lots of oozing requiring hemostatic maneuvers.  She was admitted here shortly after for symptomatic anemia and transfused 1u pRBC.  She was diagnosed with C diff as well.  She has been feeling very weak over this period with little po intake.  Diarrhea has improved.   She was sent to the ED after presenting for dialysis at outpt unit today due to weakness and hypotension.    She denies fevers and chills but thinks the AVG in the RUE has been throbbing, more swollen and red in the past few days.  She has chronic L > R foot edema which doesn't seem much worse lately.  Generally feeling fatigue and malaise.  No dyspnea.  TDC no issues.    ED working on getting IV access.     PMH: Past Medical History:  Diagnosis Date  . Allergy   . Anemia   . Anxiety   . Arthritis   . Avascular necrosis of bone (HCC)    left tibial talus due to chronic prednisone use  . Cerebellar stroke ([place])   . Depression   . Dyspnea    too much  fluid  . ESRD (end stage renal disease) on dialysis ([place])    tthsat [place]  . GERD (gastroesophageal reflux disease)   . Headache(784.0)   . History of high blood pressure   . Hyperlipemia   . Hypertension   . Lupus ([place])   . Pneumonia   . Secondary hyperparathyroidism (of renal origin)    PSH: Past Surgical History:  Procedure Laterality Date  . A/V FISTULAGRAM Left 12/02/2017   Procedure: A/V FISTULAGRAM - Left upper;  Surgeon: Angelia Mould, MD;  Location: [place] [facility];  Service: Cardiovascular;  Laterality: Left;  . A/V FISTULAGRAM N/A 10/27/2020   Procedure: A/V  FISTULAGRAM - Right Upper;  Surgeon: Cherre Robins, MD;  Location: [place] [facility];  Service: Cardiovascular;  Laterality: N/A;  . AV FISTULA PLACEMENT Left   . AV FISTULA PLACEMENT Right 01/26/2014   Procedure: ARTERIOVENOUS (AV) FISTULA CREATION; ultrasound guided;  Surgeon: Conrad Stephenson, MD;  Location: [place];  Service: Vascular;  Laterality: Right;  . AV FISTULA PLACEMENT Right 04/25/2020   Procedure: RIGHT UPPER ARM  BASILIC VEIN ARTERIOVENOUS (AV) FISTULA;  Surgeon: [name], MD;  Location: [place];  Service: Vascular;  Laterality: Right;  . AV FISTULA PLACEMENT Right 12/07/2020   Procedure: INSERTION OF RIGHT UPPER ARM ARTERIOVENOUS (AV) LOOP GRAFT;  Surgeon: [name], MD;  Location: [place];  Service: Vascular;  Laterality: Right;  . BASCILIC VEIN TRANSPOSITION Right 07/01/2020   Procedure: SECOND STAGE BASCILIC VEIN TRANSPOSITION RIGHT UPPER EXTREMITY;  Surgeon: [name], MD;  Location: [place];  Service: Vascular;  Laterality: Right;  . BUBBLE STUDY  09/20/2020   Procedure: BUBBLE STUDY;  Surgeon: Thayer Headings, MD;  Location: [facility] ENDOSCOPY;  Service: Cardiovascular;;  . ESOPHAGOGASTRODUODENOSCOPY N/A 03/17/2016   Procedure: ESOPHAGOGASTRODUODENOSCOPY (EGD);  Surgeon: Irene Shipper, MD;  Location: [facility] ENDOSCOPY;  Service: Endoscopy;  Laterality: N/A;  . FISTULOGRAM Left 09/24/2019  Procedure: FISTULOGRAM LEFT ARM;  Surgeon: [name], MD;  Location: [place];  Service: Vascular;  Laterality: Left;  . HEMATOMA EVACUATION Left 10/09/2013   Procedure: EVACUATION HEMATOMA;  Surgeon: Angelia Mould, MD;  Location: [place];  Service: Vascular;  Laterality: Left;  . HEMATOMA EVACUATION Left 09/24/2019   Procedure: EVACUATION HEMATOMA  LEFT ARM;  Surgeon: [name], MD;  Location: [place];  Service: Vascular;  Laterality: Left;  . INCISION AND DRAINAGE ABSCESS     gluteal  . INSERTION OF DIALYSIS CATHETER N/A 10/09/2013   Procedure: INSERTION  OF DIALYSIS CATHETER; ULTRASOUND GUIDED;  Surgeon: Angelia Mould, MD;  Location: [place];  Service: Vascular;  Laterality: N/A;  . INSERTION OF DIALYSIS CATHETER Left 09/24/2019   Procedure: INSERTION OF TUNNELLED DIALYSIS CATHETER - PALINDROME 15FR X 28CM;  Surgeon: [name], MD;  Location: [place];  Service: Vascular;  Laterality: Left;  . INSERTION OF DIALYSIS CATHETER Right 03/15/2020   Procedure: Insertion Of Dialysis Catheter;  Surgeon: Angelia Mould, MD;  Location: [place];  Service: Cardiovascular;  Laterality: Right;  . LOOP RECORDER INSERTION N/A 09/22/2020   Procedure: LOOP RECORDER INSERTION;  Surgeon: Thompson Grayer, MD;  Location: [place] [facility];  Service: Cardiovascular;  Laterality: N/A;  . PERIPHERAL VASCULAR BALLOON ANGIOPLASTY Left 12/02/2017   Procedure: PERIPHERAL VASCULAR BALLOON ANGIOPLASTY;  Surgeon: Angelia Mould, MD;  Location: [place] [facility];  Service: Cardiovascular;  Laterality: Left;  . PERIPHERAL VASCULAR BALLOON ANGIOPLASTY  10/27/2020   Procedure: PERIPHERAL VASCULAR BALLOON ANGIOPLASTY;  Surgeon: Cherre Robins, MD;  Location: [place] [facility];  Service: Cardiovascular;;  . REVISION OF ARTERIOVENOUS GORETEX GRAFT Left 04/03/2019   Procedure: REVISION OF ARTERIOVENOUS GORETEX GRAFT PSEUDOANEURYSM;  Surgeon: Angelia Mould, MD;  Location: [place];  Service: Vascular;  Laterality: Left;  . SHUNTOGRAM N/A 01/07/2014   Procedure: fistulogram with possibe venoplasty left upper arm avg;  Surgeon: Angelia Mould, MD;  Location: [facility] CATH LAB;  Service: Cardiovascular;  Laterality: N/A;  . TEE WITHOUT CARDIOVERSION N/A 09/20/2020   Procedure: TRANSESOPHAGEAL ECHOCARDIOGRAM (TEE);  Surgeon: Acie Fredrickson, [place] Cheng, MD;  Location: [place];  Service: Cardiovascular;  Laterality: N/A;  . THROMBECTOMY AND REVISION OF ARTERIOVENTOUS (AV) GORETEX  GRAFT Left 03/15/2020   Procedure: ATTEMPTED THROMBECTOMY OF LEFT ARM ARTERIOVENTOUS (AV)  GORETEX  GRAFT;  Surgeon: Angelia Mould, MD;  Location: [place];  Service: Cardiovascular;  Laterality: Left;  . tibiocalcaneal fusion  left Left   . ULTRASOUND GUIDANCE FOR VASCULAR ACCESS  09/24/2019   Procedure: Ultrasound Guidance For Vascular Access;  Surgeon: [name], MD;  Location: [place];  Service: Vascular;;    Past Medical History:  Diagnosis Date  . Allergy   . Anemia   . Anxiety   . Arthritis   . Avascular necrosis of bone (HCC)    left tibial talus due to chronic prednisone use  . Cerebellar stroke ([place])   . Depression   . Dyspnea    too much  fluid  . ESRD (end stage renal disease) on dialysis ([place])    tthsat [place]  . GERD (gastroesophageal reflux disease)   . Headache(784.0)   . History of high blood pressure   . Hyperlipemia   . Hypertension   . Lupus ([place])   . Pneumonia   . Secondary hyperparathyroidism (of renal origin)     Medications:  I have reviewed the patient's current medications.  (Not in a hospital  admission)   ALLERGIES:   Allergies  Allergen Reactions  . Amlodipine Hives and Swelling  . Sulfa Antibiotics Hives and Itching  . Vancomycin Hives and Itching  . Venlafaxine Other (See Comments)    Emotional    FAM HX: Family History  Problem Relation Age of Onset  . Asthma Sister   . Hypertension Mother   . Heart attack Mother 51       died at 79  . COPD Maternal Uncle        prostate  . Mental illness Neg Hx     Social History:   reports that she has never smoked. She has never used smokeless tobacco. She reports that she does not drink alcohol and does not use drugs.  ROS: 12 system ROS neg except per HPI  Blood pressure 103/64, pulse 70, temperature 97.9 F (36.6 C), temperature source Oral, resp. rate 18, height '5\' 4"'$  (1.626 m), weight 59 kg, SpO2 93 %. PHYSICAL EXAM: Gen: tired appearing, chronically ill  Eyes: proptosis? EOMI, glasses ENT: poor dentition, no thrush or ulcers, MM Tacky Neck:  supple CV: RRR, III/VI wheezing systolic murmur  Abd:  Soft, nontender, NABS Back: clear lungs GU: no foley Extr: 2+ pitting foot edema Neuro: drowsy but conversant Skin: RUE AVG warm and some erythema and tenderness; incisions proximal and distal are intact with no drainage; thrill and bruit present.  Nonpitting edema noted.   Radial pulse 1+   Results for orders placed or performed during the hospital encounter of 11/30/2020 (from the past 48 hour(s))  CBC with Differential     Status: Abnormal   Collection Time: 12/15/2020 10:34 AM  Result Value Ref Range   WBC 20.0 (H) 4.0 - 10.5 K/uL   RBC 3.28 (L) 3.87 - 5.11 MIL/uL   Hemoglobin 10.1 (L) 12.0 - 15.0 g/dL   HCT 32.0 (L) 36.0 - 46.0 %   MCV 97.6 80.0 - 100.0 fL   MCH 30.8 26.0 - 34.0 pg   MCHC 31.6 30.0 - 36.0 g/dL   RDW 23.8 (H) 11.5 - 15.5 %   Platelets 87 (L) 150 - 400 K/uL    Comment: REPEATED TO VERIFY PLATELET COUNT CONFIRMED BY SMEAR SPECIMEN CHECKED FOR CLOTS    nRBC 33.9 (H) 0.0 - 0.2 %   Neutrophils Relative % 78 %   Neutro Abs 15.6 (H) 1.7 - 7.7 K/uL   Lymphocytes Relative 4 %   Lymphs Abs 0.8 0.7 - 4.0 K/uL   Monocytes Relative 14 %   Monocytes Absolute 2.8 (H) 0.1 - 1.0 K/uL   Eosinophils Relative 0 %   Eosinophils Absolute 0.0 0.0 - 0.5 K/uL   Basophils Relative 1 %   Basophils Absolute 0.1 0.0 - 0.1 K/uL   RBC Morphology MACROCYTOSIS PRESENT    Immature Granulocytes 3 %   Abs Immature Granulocytes 0.61 (H) 0.00 - 0.07 K/uL   Polychromasia PRESENT    Target Cells PRESENT     Comment: Performed at [facility], [address] [place], [place] [address]  Comprehensive metabolic panel     Status: Abnormal   Collection Time: 12/16/2020 10:34 AM  Result Value Ref Range   Sodium 135 135 - 145 mmol/L   Potassium 5.3 (H) 3.5 - 5.1 mmol/L   Chloride 99 98 - 111 mmol/L   CO2 18 (L) 22 - 32 mmol/L   Glucose, Bld 128 (H) 70 - 99 mg/dL    Comment: Glucose reference range applies only to samples taken after  fasting  for at least 8 hours.   BUN 51 (H) 6 - 20 mg/dL   Creatinine, Ser 6.51 (H) 0.44 - 1.00 mg/dL   Calcium 7.4 (L) 8.9 - 10.3 mg/dL   Total Protein 5.8 (L) 6.5 - 8.1 g/dL   Albumin 3.1 (L) 3.5 - 5.0 g/dL   AST 113 (H) 15 - 41 U/L   ALT 53 (H) 0 - 44 U/L   Alkaline Phosphatase 385 (H) 38 - 126 U/L   Total Bilirubin 2.8 (H) 0.3 - 1.2 mg/dL   GFR, Estimated 8 (L) >60 mL/min    Comment: (NOTE) Calculated using the CKD-EPI Creatinine Equation (2021)    Anion gap 18 (H) 5 - 15    Comment: Performed at [facility], [address], [place], [place] [address]    DG Chest Portable 1 View  Result Date: 11/30/2020 CLINICAL DATA:  Weakness.  Hypotension.  Unable to go to dialysis. EXAM: PORTABLE CHEST 1 VIEW COMPARISON:  12/15/2020 FINDINGS: Right jugular double-lumen catheter tips in the right atrium, unchanged. Normal sized heart. Mildly increased bibasilar atelectasis. Stable left upper extremity pain dialysis fistula. Mild scoliosis. 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 IMPRESSION: Mildly increased bibasilar atelectasis. Electronically Signed   By: Claudie Revering M.D.   On: 12/16/2020 10:16    Dialysis Orders: as of last 09/2020 [facility] T[facility] 3h 400/600 2K/2.5Ca EDW 58kg TDC Hep bolus 1800  Assessment/Plan **Hypotension:  Recent BPs appear to be on the low side; perhaps slightly lower here today.  She has had poor po intake and diarrhea.  Give NS 52m now.  Possible sepsis contributing as well, treat per below. If worsening would give stress dose steroids.  **Sepsis:  WBC 20. Erythema and tenderness new AVG.  Vanc per pharm.  Blood cultures.    **ESRD on HD:  Labs generally ok, if anything hypovolemic (intravasc; does have mild peripheral edema).  Plan for HD tomorrow.  Discussed with HD RN.   **Anemia: Hb 10.1. Monitor  **BMM: on binder. Corr ca ok.   Page with issues.   LJustin Mend1/29/2022, 3:52 PM

## 2020-12-17 NOTE — H&P (Addendum)
History and Physical    [name] [id number] DOB: [birth year] DOA: 12/18/2020  PCP: Leeanne Rio, MD   Patient coming from: HD center  Chief Complaint: Hypotension/weakness  HPI: [name] is a 45 y.o. female with medical history significant for ESRD on HD TTS, recent right arm AV graft on 1/19, recent C. difficile diarrhea, hypertension, and lupus who presented to the ED after she could no receive hemodialysis due to low blood pressure readings.  She was seen in the ED 2 days ago for weakness and had missed her dialysis session on Thursday due to her symptoms.  She has not been eating or drinking very well and overall has not felt too well since her discharge on 1/22 when she was admitted for acute blood loss anemia related to her recent procedure as well as C. difficile diarrhea.  She received 1 unit of PRBC at that time and was discharged with Dificid and claims to have completed her course of antibiotics.  She denies any further abdominal pain or diarrhea, and states that she has not had any fever or chills.  She states that her right arm swelling has been improving over the course of the last several days and she denies any pain or discharge from the area.  She has been taking her home medications otherwise as prescribed.   ED Course: Vital signs are stable, but blood pressures are noted to be soft.  She is awake and alert.  Her hemoglobin level is stable at 10.1 and she does have a mild leukocytosis that appears to persist likely in the setting of recent C. difficile.  Lactic acid is 2.8 potassium is 5.3.  Creatinine is 6.4 with BUN 51.  Bicarbonate is 18.  Chest x-ray with mild bibasilar atelectasis noted.  Review of Systems: Reviewed as noted above, otherwise negative.  Past Medical History:  Diagnosis Date  . Allergy   . Anemia   . Anxiety   . Arthritis   . Avascular necrosis of bone (HCC)    left tibial talus due to chronic prednisone use  . Cerebellar  stroke ([place])   . Depression   . Dyspnea    too much  fluid  . ESRD (end stage renal disease) on dialysis ([place])    tthsat [place]  . GERD (gastroesophageal reflux disease)   . Headache(784.0)   . History of high blood pressure   . Hyperlipemia   . Hypertension   . Lupus ([place])   . Pneumonia   . Secondary hyperparathyroidism (of renal origin)     Past Surgical History:  Procedure Laterality Date  . A/V FISTULAGRAM Left 12/02/2017   Procedure: A/V FISTULAGRAM - Left upper;  Surgeon: Angelia Mould, MD;  Location: [place] [facility];  Service: Cardiovascular;  Laterality: Left;  . A/V FISTULAGRAM N/A 10/27/2020   Procedure: A/V FISTULAGRAM - Right Upper;  Surgeon: Cherre Robins, MD;  Location: [place] [facility];  Service: Cardiovascular;  Laterality: N/A;  . AV FISTULA PLACEMENT Left   . AV FISTULA PLACEMENT Right 01/26/2014   Procedure: ARTERIOVENOUS (AV) FISTULA CREATION; ultrasound guided;  Surgeon: Conrad Oxford, MD;  Location: [place];  Service: Vascular;  Laterality: Right;  . AV FISTULA PLACEMENT Right 04/25/2020   Procedure: RIGHT UPPER ARM  BASILIC VEIN ARTERIOVENOUS (AV) FISTULA;  Surgeon: [name], MD;  Location: [place];  Service: Vascular;  Laterality: Right;  . AV FISTULA PLACEMENT Right 12/07/2020   Procedure: INSERTION OF RIGHT  UPPER ARM ARTERIOVENOUS (AV) LOOP GRAFT;  Surgeon: [name], MD;  Location: [place];  Service: Vascular;  Laterality: Right;  . BASCILIC VEIN TRANSPOSITION Right 07/01/2020   Procedure: SECOND STAGE BASCILIC VEIN TRANSPOSITION RIGHT UPPER EXTREMITY;  Surgeon: [name], MD;  Location: [place];  Service: Vascular;  Laterality: Right;  . BUBBLE STUDY  09/20/2020   Procedure: BUBBLE STUDY;  Surgeon: Thayer Headings, MD;  Location: [facility];  Service: Cardiovascular;;  . ESOPHAGOGASTRODUODENOSCOPY N/A 03/17/2016   Procedure: ESOPHAGOGASTRODUODENOSCOPY (EGD);  Surgeon: Irene Shipper, MD;  Location: [facility] ENDOSCOPY;   Service: Endoscopy;  Laterality: N/A;  . FISTULOGRAM Left 09/24/2019   Procedure: FISTULOGRAM LEFT ARM;  Surgeon: [name], MD;  Location: [place];  Service: Vascular;  Laterality: Left;  . HEMATOMA EVACUATION Left 10/09/2013   Procedure: EVACUATION HEMATOMA;  Surgeon: Angelia Mould, MD;  Location: [place];  Service: Vascular;  Laterality: Left;  . HEMATOMA EVACUATION Left 09/24/2019   Procedure: EVACUATION HEMATOMA  LEFT ARM;  Surgeon: [name], MD;  Location: [place];  Service: Vascular;  Laterality: Left;  . INCISION AND DRAINAGE ABSCESS     gluteal  . INSERTION OF DIALYSIS CATHETER N/A 10/09/2013   Procedure: INSERTION OF DIALYSIS CATHETER; ULTRASOUND GUIDED;  Surgeon: Angelia Mould, MD;  Location: [place];  Service: Vascular;  Laterality: N/A;  . INSERTION OF DIALYSIS CATHETER Left 09/24/2019   Procedure: INSERTION OF TUNNELLED DIALYSIS CATHETER - PALINDROME 15FR X 28CM;  Surgeon: [name], MD;  Location: [place];  Service: Vascular;  Laterality: Left;  . INSERTION OF DIALYSIS CATHETER Right 03/15/2020   Procedure: Insertion Of Dialysis Catheter;  Surgeon: Angelia Mould, MD;  Location: [place];  Service: Cardiovascular;  Laterality: Right;  . LOOP RECORDER INSERTION N/A 09/22/2020   Procedure: LOOP RECORDER INSERTION;  Surgeon: Thompson Grayer, MD;  Location: [place] [facility];  Service: Cardiovascular;  Laterality: N/A;  . PERIPHERAL VASCULAR BALLOON ANGIOPLASTY Left 12/02/2017   Procedure: PERIPHERAL VASCULAR BALLOON ANGIOPLASTY;  Surgeon: Angelia Mould, MD;  Location: [place] [facility];  Service: Cardiovascular;  Laterality: Left;  . PERIPHERAL VASCULAR BALLOON ANGIOPLASTY  10/27/2020   Procedure: PERIPHERAL VASCULAR BALLOON ANGIOPLASTY;  Surgeon: Cherre Robins, MD;  Location: [place] [facility];  Service: Cardiovascular;;  . REVISION OF ARTERIOVENOUS GORETEX GRAFT Left 04/03/2019   Procedure: REVISION OF ARTERIOVENOUS GORETEX GRAFT  PSEUDOANEURYSM;  Surgeon: Angelia Mould, MD;  Location: [place];  Service: Vascular;  Laterality: Left;  . SHUNTOGRAM N/A 01/07/2014   Procedure: fistulogram with possibe venoplasty left upper arm avg;  Surgeon: Angelia Mould, MD;  Location: [facility] CATH LAB;  Service: Cardiovascular;  Laterality: N/A;  . TEE WITHOUT CARDIOVERSION N/A 09/20/2020   Procedure: TRANSESOPHAGEAL ECHOCARDIOGRAM (TEE);  Surgeon: Acie Fredrickson, Wonda Cheng, MD;  Location: [place];  Service: Cardiovascular;  Laterality: N/A;  . THROMBECTOMY AND REVISION OF ARTERIOVENTOUS (AV) GORETEX  GRAFT Left 03/15/2020   Procedure: ATTEMPTED THROMBECTOMY OF LEFT ARM ARTERIOVENTOUS (AV) GORETEX  GRAFT;  Surgeon: Angelia Mould, MD;  Location: [place];  Service: Cardiovascular;  Laterality: Left;  . tibiocalcaneal fusion  left Left   . ULTRASOUND GUIDANCE FOR VASCULAR ACCESS  09/24/2019   Procedure: Ultrasound Guidance For Vascular Access;  Surgeon: [name], MD;  Location: [place];  Service: Vascular;;     reports that she has never smoked. She has never used smokeless tobacco. She reports that she does not drink alcohol and does not  use drugs.  Allergies  Allergen Reactions  . Amlodipine Hives and Swelling  . Sulfa Antibiotics Hives and Itching  . Vancomycin Hives and Itching  . Venlafaxine Other (See Comments)    Emotional    Family History  Problem Relation Age of Onset  . Asthma Sister   . Hypertension Mother   . Heart attack Mother 30       died at 44  . COPD Maternal Uncle        prostate  . Mental illness Neg Hx     Prior to Admission medications   Medication Sig Start Date End Date Taking? Authorizing Provider  aspirin EC 325 MG EC tablet Take 1 tablet (325 mg total) by mouth daily. 09/23/20   Gifford Shave, MD  atorvastatin (LIPITOR) 40 MG tablet Take 1 tablet (40 mg total) by mouth daily. 09/29/20   Angiulli, Lavon Paganini, PA-C  b complex-vitamin c-folic acid (NEPHRO-VITE) 0.8 MG TABS tablet  Take 1 tablet by mouth daily at 12 noon. Patient taking differently: Take 1 tablet by mouth daily. 09/29/20   Angiulli, Lavon Paganini, PA-C  calcium acetate, Phos Binder, (PHOSLYRA) 667 MG/5ML SOLN Take 15 mLs (2,001 mg total) by mouth 3 (three) times daily with meals. Patient taking differently: Take 2,001 mg by mouth See admin instructions. Take 15 mls (2001 mg) by mouth 3 times daily with meals & with each snacks. 09/29/20   Angiulli, Lavon Paganini, PA-C  Carboxymethylcellul-Glycerin (LUBRICATING EYE DROPS OP) Place 1 drop into both eyes 2 (two) times daily as needed (dry/irritated eyes.).     [provider]  clopidogrel (PLAVIX) 75 MG tablet Take 75 mg by mouth daily.    [provider]  Darbepoetin Alfa (ARANESP) 60 MCG/0.3ML SOSY injection Inject 0.3 mLs (60 mcg total) into the vein every Tuesday with hemodialysis. 10/04/20   Angiulli, Lavon Paganini, PA-C  diltiazem (DILACOR XR) 240 MG 24 hr capsule Take 240 mg by mouth daily.    [provider]  fidaxomicin (DIFICID) 200 MG TABS tablet Take 1 tablet (200 mg total) by mouth 2 (two) times daily. 12/10/20   Kathie Dike, MD  hydrALAZINE (APRESOLINE) 25 MG tablet Take 25 mg by mouth 2 (two) times daily.    [provider]  HYDROcodone-acetaminophen (NORCO) 5-325 MG tablet Take 1 tablet by mouth every 6 (six) hours as needed for moderate pain. 12/07/20   Dagoberto Ligas, PA-C  meclizine (ANTIVERT) 25 MG tablet Take 25 mg by mouth 3 (three) times daily as needed for dizziness or nausea.    [provider]  metoprolol succinate (TOPROL-XL) 25 MG 24 hr tablet Take 25 mg by mouth daily. 11/01/20   [provider]  ondansetron (ZOFRAN-ODT) 4 MG disintegrating tablet Take 4 mg by mouth daily as needed for nausea or vomiting.    [provider]  pantoprazole (PROTONIX) 40 MG tablet Take 1 tablet (40 mg total) by mouth daily. 09/29/20   Angiulli, Lavon Paganini, PA-C  PARoxetine (PAXIL) 40 MG tablet Take 1 tablet  (40 mg total) by mouth daily. 09/29/20   Angiulli, Lavon Paganini, PA-C  predniSONE (DELTASONE) 10 MG tablet Take 1 tablet (10 mg total) by mouth daily with breakfast. Patient taking differently: Take 10 mg by mouth daily with breakfast. For Lupus 09/29/20   Angiulli, Lavon Paganini, PA-C  traZODone (DESYREL) 100 MG tablet Take 1 tablet (100 mg total) by mouth at bedtime. Patient not taking: Reported on 12/15/2020 09/29/20   [place], Lavon Paganini, PA-C  traZODone ([place])  50 MG tablet Take 50 mg by mouth at bedtime.    [provider]    Physical Exam: Vitals:   12/10/2020 1000 12/15/2020 1002 12/13/2020 1100 12/08/2020 1130  BP: 103/64     Pulse: 70  71 70  Resp: '16  17 15  '$ Temp:  97.9 F (36.6 C)    TempSrc:  Oral    SpO2: 99%  95% 93%  Weight:      Height:        Constitutional: NAD, calm, comfortable Vitals:   11/24/2020 1000 12/09/2020 1002 12/12/2020 1100 11/27/2020 1130  BP: 103/64     Pulse: 70  71 70  Resp: '16  17 15  '$ Temp:  97.9 F (36.6 C)    TempSrc:  Oral    SpO2: 99%  95% 93%  Weight:      Height:       Eyes: lids and conjunctivae normal Neck: normal, supple Respiratory: clear to auscultation bilaterally. Normal respiratory effort. No accessory muscle use.  Cardiovascular: Regular rate and rhythm, no murmurs. Abdomen: no tenderness, no distention. Bowel sounds positive.  Musculoskeletal: Right arm with palpable thrill, no significant warmth, tenderness, or erythema noted. Skin: no rashes, lesions, ulcers.  Psychiatric: Flat affect  Labs on Admission: I have personally reviewed following labs and imaging studies  CBC: Recent Labs  Lab 12/15/20 0747 11/29/2020 1034  WBC 18.8* 20.0*  NEUTROABS 14.9* 15.6*  HGB 9.4* 10.1*  HCT 30.5* 32.0*  MCV 95.9 97.6  PLT 86* 87*   Basic Metabolic Panel: Recent Labs  Lab 12/15/20 0747 11/25/2020 1034  NA 138 135  K 4.7 5.3*  CL 99 99  CO2 20* 18*  GLUCOSE 169* 128*  BUN 59* 51*  CREATININE 7.70* 6.51*  CALCIUM 7.3* 7.4*    GFR: Estimated Creatinine Clearance: 9.5 mL/min (A) (by C-G formula based on SCr of 6.51 mg/dL (H)). Liver Function Tests: Recent Labs  Lab 12/15/20 0747 11/30/2020 1034  AST 41 113*  ALT 26 53*  ALKPHOS 355* 385*  BILITOT 2.0* 2.8*  PROT 5.3* 5.8*  ALBUMIN 2.9* 3.1*   No results for input(s): LIPASE, AMYLASE in the last 168 hours. No results for input(s): AMMONIA in the last 168 hours. Coagulation Profile: No results for input(s): INR, PROTIME in the last 168 hours. Cardiac Enzymes: No results for input(s): CKTOTAL, CKMB, CKMBINDEX, TROPONINI in the last 168 hours. BNP (last 3 results) No results for input(s): PROBNP in the last 8760 hours. HbA1C: No results for input(s): HGBA1C in the last 72 hours. CBG: No results for input(s): GLUCAP in the last 168 hours. Lipid Profile: No results for input(s): CHOL, HDL, LDLCALC, TRIG, CHOLHDL, LDLDIRECT in the last 72 hours. Thyroid Function Tests: No results for input(s): TSH, T4TOTAL, FREET4, T3FREE, THYROIDAB in the last 72 hours. Anemia Panel: No results for input(s): VITAMINB12, FOLATE, FERRITIN, TIBC, IRON, RETICCTPCT in the last 72 hours. Urine analysis:    Component Value Date/Time   COLORURINE YELLOW 06/11/2011 0959   APPEARANCEUR TURBID (A) 06/11/2011 0959   LABSPEC 1.025 06/11/2011 0959   PHURINE 5.0 06/11/2011 0959   GLUCOSEU NEGATIVE 06/11/2011 0959   HGBUR MODERATE (A) 06/11/2011 0959   BILIRUBINUR NEGATIVE 06/11/2011 0959   KETONESUR NEGATIVE 06/11/2011 0959   PROTEINUR 100 (A) 06/11/2011 0959   UROBILINOGEN 0.2 06/11/2011 0959   NITRITE NEGATIVE 06/11/2011 0959   LEUKOCYTESUR TRACE (A) 06/11/2011 0959    Radiological Exams on Admission: DG Chest Portable 1 View  Result Date: 12/10/2020 CLINICAL DATA:  Weakness.  Hypotension.  Unable to go to dialysis. EXAM: PORTABLE CHEST 1 VIEW COMPARISON:  12/15/2020 FINDINGS: Right jugular double-lumen catheter tips in the right atrium, unchanged. Normal sized heart.  Mildly increased bibasilar atelectasis. Stable left upper extremity pain dialysis fistula. Mild scoliosis. 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 IMPRESSION: Mildly increased bibasilar atelectasis. Electronically Signed   By: Claudie Revering M.D.   On: 11/19/2020 10:16    EKG: Independently reviewed. SR 68bpm.  Assessment/Plan Active Problems:   Hypotension    Hypotension in the setting of hypertension -In the setting of poor oral intake with recent C. difficile diarrhea -Continue to monitor closely -Hold home antihypertensives -IV fluid bolus and midodrine  ESRD on HD with missed hemodialysis sessions -Nephrology consulted with hemodialysis while inpatient -Continue to monitor labs -Renal diet  Elevated alkaline phosphatase and mild LFT elevation -Hold statin will monitor repeat labs -Given recent GI symptoms, will plan to obtain right upper quadrant ultrasound  Mild lactic acidosis; possible sepsis from infection of AV graft? -Presumably related to hypotension and dehydration -Small bolus of IV fluid and repeat in a.m. -Plan to check procalcitonin -Nephrology recommends blood cultures and vancomycin (changed to daptomycin due to allergy) -May start once central venous line placed by Surgery  Recent C. difficile diarrhea -Patient states that she has completed course of Dificid and has no further diarrhea symptoms  History of lupus -Continue prednisone  Thrombocytopenia -Avoid heparin agents -Hold Aspirin and Plavix  GERD -Continue Protonix  DVT prophylaxis: SCDs Code Status: Full Family Communication: Cousin at bedside Disposition Plan:Admit for HD Consults called:Nephrology Admission status: Obs, Tele  Severity of Illness: The appropriate patient status for this patient is OBSERVATION. Observation status is judged to be reasonable and necessary in order to provide the required intensity of service to ensure the patient's safety. The patient's  presenting symptoms, physical exam findings, and initial radiographic and laboratory data in the context of their medical condition is felt to place them at decreased risk for further clinical deterioration. Furthermore, it is anticipated that the patient will be medically stable for discharge from the hospital within 2 midnights of admission. The following factors support the patient status of observation.   " The patient's presenting symptoms include weakness. " The initial radiographic and laboratory data are positive for hypotension, hyperkalemia, and acidosis.     Elenie Coven D Manuella Ghazi DO [facility]  If 7PM-7AM, please contact night-coverage [email or web address]  12/02/2020, 1:14 PM

## 2020-12-17 NOTE — Progress Notes (Signed)
Attempted IV x 2. Patient is ERSD with right arm restriction. Unable to obtain IV via ultrasound. Very limited due to abundant scar tissue throughout left arm, and previous LUA fistula. If vascular access is needed, would recommend Central line placement. Primary RN notified.

## 2020-12-17 NOTE — ED Notes (Signed)
Pt placed on 2L [place] for comfort d/t "feeling anxious." Pt sats on RA 99-100%

## 2020-12-17 NOTE — ED Provider Notes (Addendum)
[facility] EMERGENCY DEPARTMENT Provider Note   CSN: [id number] Arrival date & time: 11/30/2020  [id number]     History Chief Complaint  Patient presents with  . Hypotension    [name] is a 45 y.o. female.  HPI Patient presents with generalized weakness.  Reportedly was at dialysis and could not be completed due to low blood pressure.  States she has been feeling weak overall.  Was seen in the ER 2 days ago for weakness.  That was also supposed to be a dialysis day.  She was not dialyzed on Thursday as planned and was supposed to go in on Friday for dialysis but did not go.  States she feels even more weak today.  Recent admission the hospital for weakness due to anemia from blood loss with graft surgery and was C. difficile diarrhea.  States diarrhea slowed down but does not have much of an appetite.  States she feels as if her volume is up but also states she feels as if her volume is down because she has not been eating.    Past Medical History:  Diagnosis Date  . Allergy   . Anemia   . Anxiety   . Arthritis   . Avascular necrosis of bone (HCC)    left tibial talus due to chronic prednisone use  . Cerebellar stroke ([place])   . Depression   . Dyspnea    too much  fluid  . ESRD (end stage renal disease) on dialysis ([place])    tthsat [facility]  . GERD (gastroesophageal reflux disease)   . Headache(784.0)   . History of high blood pressure   . Hyperlipemia   . Hypertension   . Lupus ([place])   . Pneumonia   . Secondary hyperparathyroidism (of renal origin)     Patient Active Problem List   Diagnosis Date Noted  . ABLA (acute blood loss anemia) 12/09/2020  . Leukocytosis 12/08/2020  . 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 ESRD on dialysis ([place])   . Steroid-induced hyperglycemia   . Avascular necrosis ([place])   . Anemia   . Dizziness   . Nausea   . CVA (cerebral vascular accident) ([place]) 09/17/2020  . Cerebellar stroke ([place])   .  CAP (community acquired pneumonia) due to Chlamydia species 06/08/2020  . 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 AV fistula occlusion, initial encounter ([place]) 03/14/2020  . Hyperkalemia   . Hematoma of arm, left, initial encounter 09/25/2019  . AV graft malfunction ([place]) 09/24/2019  . Mild dysplasia of cervix (CIN I) bx provern 10/2018 11/26/2018  . Bilateral lower extremity edema 10/22/2018  . Pre-transplant evaluation for kidney transplant 08/22/2018  . Anxiety and depression 04/16/2018  . Difficulty sleeping 04/16/2018  . Heart palpitations 11/20/2017  . History of duodenal ulcer 10/21/2017  . Metabolic disorder [id number]  . Osteopathy in diseases classified elsewhere, unspecified site 09/20/2017  . GERD (gastroesophageal reflux disease) 07/10/2016  . Acute blood loss anemia 03/17/2016  . Thrombocytopenia ([place]) 03/17/2016  . Duodenal ulcer hemorrhagic   . Esophageal stricture   . Adjustment disorder with mixed anxiety and depressed mood 10/25/2015  . Lupus ([place]) 04/29/2015  . ESRD (end stage renal disease) ([place]) 10/09/2013  . Anemia in chronic kidney disease 10/09/2013  . Secondary renal hyperparathyroidism ([place]) 10/09/2013  . Essential hypertension 10/09/2013    Past Surgical History:  Procedure Laterality Date  . A/V FISTULAGRAM Left 12/02/2017   Procedure: A/V FISTULAGRAM - Left upper;  Surgeon:  Angelia Mould, MD;  Location: [place] [facility];  Service: Cardiovascular;  Laterality: Left;  . A/V FISTULAGRAM N/A 10/27/2020   Procedure: A/V FISTULAGRAM - Right Upper;  Surgeon: Cherre Robins, MD;  Location: [place] [facility];  Service: Cardiovascular;  Laterality: N/A;  . AV FISTULA PLACEMENT Left   . AV FISTULA PLACEMENT Right 01/26/2014   Procedure: ARTERIOVENOUS (AV) FISTULA CREATION; ultrasound guided;  Surgeon: Conrad Nunapitchuk, MD;  Location: [place];  Service: Vascular;  Laterality: Right;  . AV FISTULA PLACEMENT Right 04/25/2020   Procedure: RIGHT UPPER ARM   BASILIC VEIN ARTERIOVENOUS (AV) FISTULA;  Surgeon: [name], MD;  Location: [place];  Service: Vascular;  Laterality: Right;  . AV FISTULA PLACEMENT Right 12/07/2020   Procedure: INSERTION OF RIGHT UPPER ARM ARTERIOVENOUS (AV) LOOP GRAFT;  Surgeon: [name], MD;  Location: [place];  Service: Vascular;  Laterality: Right;  . BASCILIC VEIN TRANSPOSITION Right 07/01/2020   Procedure: SECOND STAGE BASCILIC VEIN TRANSPOSITION RIGHT UPPER EXTREMITY;  Surgeon: [name], MD;  Location: [place];  Service: Vascular;  Laterality: Right;  . BUBBLE STUDY  09/20/2020   Procedure: BUBBLE STUDY;  Surgeon: Thayer Headings, MD;  Location: [facility] ENDOSCOPY;  Service: Cardiovascular;;  . ESOPHAGOGASTRODUODENOSCOPY N/A 03/17/2016   Procedure: ESOPHAGOGASTRODUODENOSCOPY (EGD);  Surgeon: Irene Shipper, MD;  Location[facility] ENDOSCOPY;  Service: Endoscopy;  Laterality: N/A;  . FISTULOGRAM Left 09/24/2019   Procedure: FISTULOGRAM LEFT ARM;  Surgeon: [name], MD;  Location: [place];  Service: Vascular;  Laterality: Left;  . HEMATOMA EVACUATION Left 10/09/2013   Procedure: EVACUATION HEMATOMA;  Surgeon: Angelia Mould, MD;  Location: [place];  Service: Vascular;  Laterality: Left;  . HEMATOMA EVACUATION Left 09/24/2019   Procedure: EVACUATION HEMATOMA  LEFT ARM;  Surgeon: [name], MD;  Location: [place];  Service: Vascular;  Laterality: Left;  . INCISION AND DRAINAGE ABSCESS     gluteal  . INSERTION OF DIALYSIS CATHETER N/A 10/09/2013   Procedure: INSERTION OF DIALYSIS CATHETER; ULTRASOUND GUIDED;  Surgeon: Angelia Mould, MD;  Location: [place];  Service: Vascular;  Laterality: N/A;  . INSERTION OF DIALYSIS CATHETER Left 09/24/2019   Procedure: INSERTION OF TUNNELLED DIALYSIS CATHETER - PALINDROME 15FR X 28CM;  Surgeon: [name], MD;  Location: [place];  Service: Vascular;  Laterality: Left;  . INSERTION OF DIALYSIS CATHETER Right 03/15/2020   Procedure: Insertion  Of Dialysis Catheter;  Surgeon: Angelia Mould, MD;  Location: [place];  Service: Cardiovascular;  Laterality: Right;  . LOOP RECORDER INSERTION N/A 09/22/2020   Procedure: LOOP RECORDER INSERTION;  Surgeon: Thompson Grayer, MD;  Location: [place] [facility];  Service: Cardiovascular;  Laterality: N/A;  . PERIPHERAL VASCULAR BALLOON ANGIOPLASTY Left 12/02/2017   Procedure: PERIPHERAL VASCULAR BALLOON ANGIOPLASTY;  Surgeon: Angelia Mould, MD;  Location: [place] [facility];  Service: Cardiovascular;  Laterality: Left;  . PERIPHERAL VASCULAR BALLOON ANGIOPLASTY  10/27/2020   Procedure: PERIPHERAL VASCULAR BALLOON ANGIOPLASTY;  Surgeon: Cherre Robins, MD;  Location: [place] [facility];  Service: Cardiovascular;;  . REVISION OF ARTERIOVENOUS GORETEX GRAFT Left 04/03/2019   Procedure: REVISION OF ARTERIOVENOUS GORETEX GRAFT PSEUDOANEURYSM;  Surgeon: Angelia Mould, MD;  Location: [place];  Service: Vascular;  Laterality: Left;  . SHUNTOGRAM N/A 01/07/2014   Procedure: fistulogram with possibe venoplasty left upper arm avg;  Surgeon: Angelia Mould, MD;  Location: [facility] CATH LAB;  Service: Cardiovascular;  Laterality: N/A;  .  TEE WITHOUT CARDIOVERSION N/A 09/20/2020   Procedure: TRANSESOPHAGEAL ECHOCARDIOGRAM (TEE);  Surgeon: Acie Fredrickson, Wonda Cheng, MD;  Location: [place];  Service: Cardiovascular;  Laterality: N/A;  . THROMBECTOMY AND REVISION OF ARTERIOVENTOUS (AV) GORETEX  GRAFT Left 03/15/2020   Procedure: ATTEMPTED THROMBECTOMY OF LEFT ARM ARTERIOVENTOUS (AV) GORETEX  GRAFT;  Surgeon: Angelia Mould, MD;  Location: [place];  Service: Cardiovascular;  Laterality: Left;  . tibiocalcaneal fusion  left Left   . ULTRASOUND GUIDANCE FOR VASCULAR ACCESS  09/24/2019   Procedure: Ultrasound Guidance For Vascular Access;  Surgeon: [name], MD;  [facility];  Service: Vascular;;     OB History    Gravida  0   Para  0   Term  0   Preterm  0   AB  0   Living   0     SAB  0   IAB  0   Ectopic  0   Multiple  0   Live Births  0           Family History  Problem Relation Age of Onset  . Asthma Sister   . Hypertension Mother   . Heart attack Mother 35       died at 50  . COPD Maternal Uncle        prostate  . Mental illness Neg Hx     Social History   Tobacco Use  . Smoking status: Never Smoker  . Smokeless tobacco: Never Used  Vaping Use  . Vaping Use: Never used  Substance Use Topics  . Alcohol use: No  . Drug use: No    Home Medications Prior to Admission medications   Medication Sig Start Date End Date Taking? Authorizing Provider  aspirin EC 325 MG EC tablet Take 1 tablet (325 mg total) by mouth daily. 09/23/20   Gifford Shave, MD  atorvastatin (LIPITOR) 40 MG tablet Take 1 tablet (40 mg total) by mouth daily. 09/29/20   Angiulli, Lavon Paganini, PA-C  b complex-vitamin c-folic acid (NEPHRO-VITE) 0.8 MG TABS tablet Take 1 tablet by mouth daily at 12 noon. Patient taking differently: Take 1 tablet by mouth daily. 09/29/20   Angiulli, Lavon Paganini, PA-C  calcium acetate, Phos Binder, (PHOSLYRA) 667 MG/5ML SOLN Take 15 mLs (2,001 mg total) by mouth 3 (three) times daily with meals. Patient taking differently: Take 2,001 mg by mouth See admin instructions. Take 15 mls (2001 mg) by mouth 3 times daily with meals & with each snacks. 09/29/20   Angiulli, Lavon Paganini, PA-C  Carboxymethylcellul-Glycerin (LUBRICATING EYE DROPS OP) Place 1 drop into both eyes 2 (two) times daily as needed (dry/irritated eyes.).     [provider]  clopidogrel (PLAVIX) 75 MG tablet Take 75 mg by mouth daily.    [provider]  Darbepoetin Alfa (ARANESP) 60 MCG/0.3ML SOSY injection Inject 0.3 mLs (60 mcg total) into the vein every Tuesday with hemodialysis. 10/04/20   Angiulli, Lavon Paganini, PA-C  diltiazem (DILACOR XR) 240 MG 24 hr capsule Take 240 mg by mouth daily.    [provider]  fidaxomicin (DIFICID) 200 MG TABS tablet Take  1 tablet (200 mg total) by mouth 2 (two) times daily. 12/10/20   Kathie Dike, MD  hydrALAZINE (APRESOLINE) 25 MG tablet Take 25 mg by mouth 2 (two) times daily.    [provider]  HYDROcodone-acetaminophen (NORCO) 5-325 MG tablet Take 1 tablet by mouth every 6 (six) hours as needed for moderate pain. 12/07/20  Dagoberto Ligas, PA-C  meclizine (ANTIVERT) 25 MG tablet Take 25 mg by mouth 3 (three) times daily as needed for dizziness or nausea.    [provider]  metoprolol succinate (TOPROL-XL) 25 MG 24 hr tablet Take 25 mg by mouth daily. 11/01/20   [provider]  ondansetron (ZOFRAN-ODT) 4 MG disintegrating tablet Take 4 mg by mouth daily as needed for nausea or vomiting.    [provider]  pantoprazole (PROTONIX) 40 MG tablet Take 1 tablet (40 mg total) by mouth daily. 09/29/20   Angiulli, Lavon Paganini, PA-C  PARoxetine (PAXIL) 40 MG tablet Take 1 tablet (40 mg total) by mouth daily. 09/29/20   Angiulli, Lavon Paganini, PA-C  predniSONE (DELTASONE) 10 MG tablet Take 1 tablet (10 mg total) by mouth daily with breakfast. Patient taking differently: Take 10 mg by mouth daily with breakfast. For Lupus 09/29/20   Angiulli, Lavon Paganini, PA-C  traZODone (DESYREL) 100 MG tablet Take 1 tablet (100 mg total) by mouth at bedtime. Patient not taking: Reported on 12/15/2020 09/29/20   Angiulli, Lavon Paganini, PA-C  traZODone (DESYREL) 50 MG tablet Take 50 mg by mouth at bedtime.    [provider]    Allergies    Amlodipine, Sulfa antibiotics, Vancomycin, and Venlafaxine  Review of Systems   Review of Systems  Constitutional: Positive for appetite change and fatigue. Negative for fever.  HENT: Negative for congestion.   Respiratory: Negative for shortness of breath.   Cardiovascular: Negative for chest pain.  Gastrointestinal: Positive for diarrhea. Negative for abdominal pain and constipation.  Genitourinary: Negative for dysuria.  Musculoskeletal: Negative for back  pain.  Skin: Negative for pallor.  Neurological: Positive for weakness.  Psychiatric/Behavioral: Negative for confusion.    Physical Exam Updated Vital Signs BP 103/64   Pulse 70   Temp 97.9 F (36.6 C) (Oral)   Resp 15   Ht '5\' 4"'$  (1.626 m)   Wt 59 kg   SpO2 93%   BMI 22.31 kg/m   Physical Exam Vitals and nursing note reviewed.  HENT:     Head: Atraumatic.     Mouth/Throat:     Mouth: Mucous membranes are moist.  Cardiovascular:     Rate and Rhythm: Regular rhythm.  Pulmonary:     Breath sounds: No wheezing or rhonchi.     Comments: Dialysis catheter right chest wall. Abdominal:     Tenderness: There is no abdominal tenderness.  Musculoskeletal:     Cervical back: Neck supple.     Comments: Mild edema to lower extremities but does have pitting edema to upper extremities.  Worse edema on the right side where recent dialysis access surgery was done.  There is some erythema with some induration also.  Skin:    Capillary Refill: Capillary refill takes less than 2 seconds.  Neurological:     Mental Status: She is alert and oriented to person, place, and time.     ED Results / Procedures / Treatments   Labs (all labs ordered are listed, but only abnormal results are displayed) Labs Reviewed  CBC WITH DIFFERENTIAL/PLATELET - Abnormal; Notable for the following components:      Result Value   WBC 20.0 (*)    RBC 3.28 (*)    Hemoglobin 10.1 (*)    HCT 32.0 (*)    RDW 23.8 (*)    Platelets 87 (*)    nRBC 33.9 (*)    Neutro Abs 15.6 (*)    Monocytes Absolute 2.8 (*)  Abs Immature Granulocytes 0.61 (*)    All other components within normal limits  COMPREHENSIVE METABOLIC PANEL - Abnormal; Notable for the following components:   Potassium 5.3 (*)    CO2 18 (*)    Glucose, Bld 128 (*)    BUN 51 (*)    Creatinine, Ser 6.51 (*)    Calcium 7.4 (*)    Total Protein 5.8 (*)    Albumin 3.1 (*)    AST 113 (*)    ALT 53 (*)    Alkaline Phosphatase 385 (*)    Total  Bilirubin 2.8 (*)    GFR, Estimated 8 (*)    Anion gap 18 (*)    All other components within normal limits    EKG None  Radiology DG Chest Portable 1 View  Result Date: 11/20/2020 CLINICAL DATA:  Weakness.  Hypotension.  Unable to go to dialysis. EXAM: PORTABLE CHEST 1 VIEW COMPARISON:  12/15/2020 FINDINGS: Right jugular double-lumen catheter tips in the right atrium, unchanged. Normal sized heart. Mildly increased bibasilar atelectasis. Stable left upper extremity pain dialysis fistula. Mild scoliosis. 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 IMPRESSION: Mildly increased bibasilar atelectasis. Electronically Signed   By: Claudie Revering M.D.   On: 12/09/2020 10:16    Procedures Procedures   Medications Ordered in ED Medications  sodium chloride 0.9 % bolus 500 mL (has no administration in time range)    ED Course  I have reviewed the triage vital signs and the nursing notes.  Pertinent labs & imaging results that were available during my care of the patient were reviewed by me and considered in my medical decision making (see chart for details).    MDM Rules/Calculators/A&P                          Patient presents with weakness.  Unable to dialyze.  States went to dialysis today was able to do it due to hypotension.  Has not had fevers but has had decreased oral intake.  Recent C. difficile infection.  Leukocytosis is now increasing.  Also having more swelling in the right arm with some induration.  Potentially could be secondary to recent surgery for AV graft loop.  Hemoglobin is reassuring.  However patient has been unable to dialyze due to the weakness.  With the leukocytosis and need for dialysis I feels the patient benefit from admission to the hospital.Will discuss with hospitalist. Do not feel as if the hypotension is due to a severe sepsis at this time.  Potentially has some volume depletion due to decreased oral intake but also does have potential volume  overload secondary to lack of dialysis. Final Clinical Impression(s) / ED Diagnoses Final diagnoses:  End stage renal disease on dialysis ([place])  Weakness  Leukocytosis, unspecified type    Rx / DC Orders ED Discharge Orders    None       Davonna Belling, MD 12/10/2020 [id number]    Davonna Belling, MD 12/05/2020 1208

## 2020-12-17 NOTE — ED Triage Notes (Signed)
Pt was at dialysis, unable to finish due to hypotension and weakness.    Pt was recently seen on 1/27 for same issue.

## 2020-12-18 ENCOUNTER — Observation Stay (HOSPITAL_COMMUNITY): Payer: Medicare Other

## 2020-12-18 DIAGNOSIS — R6521 Severe sepsis with septic shock: Secondary | ICD-10-CM | POA: Diagnosis present

## 2020-12-18 DIAGNOSIS — M879 Osteonecrosis, unspecified: Secondary | ICD-10-CM | POA: Diagnosis present

## 2020-12-18 DIAGNOSIS — N2581 Secondary hyperparathyroidism of renal origin: Secondary | ICD-10-CM | POA: Diagnosis present

## 2020-12-18 DIAGNOSIS — T827XXA Infection and inflammatory reaction due to other cardiac and vascular devices, implants and grafts, initial encounter: Secondary | ICD-10-CM | POA: Diagnosis present

## 2020-12-18 DIAGNOSIS — R188 Other ascites: Secondary | ICD-10-CM | POA: Diagnosis present

## 2020-12-18 DIAGNOSIS — N186 End stage renal disease: Secondary | ICD-10-CM | POA: Diagnosis present

## 2020-12-18 DIAGNOSIS — U071 COVID-19: Secondary | ICD-10-CM | POA: Diagnosis present

## 2020-12-18 DIAGNOSIS — I959 Hypotension, unspecified: Secondary | ICD-10-CM | POA: Diagnosis not present

## 2020-12-18 DIAGNOSIS — E872 Acidosis: Secondary | ICD-10-CM | POA: Diagnosis present

## 2020-12-18 DIAGNOSIS — Z992 Dependence on renal dialysis: Secondary | ICD-10-CM | POA: Diagnosis not present

## 2020-12-18 DIAGNOSIS — A419 Sepsis, unspecified organism: Secondary | ICD-10-CM | POA: Diagnosis not present

## 2020-12-18 DIAGNOSIS — Y832 Surgical operation with anastomosis, bypass or graft as the cause of abnormal reaction of the patient, or of later complication, without mention of misadventure at the time of the procedure: Secondary | ICD-10-CM | POA: Diagnosis present

## 2020-12-18 DIAGNOSIS — I469 Cardiac arrest, cause unspecified: Secondary | ICD-10-CM | POA: Diagnosis not present

## 2020-12-18 DIAGNOSIS — E785 Hyperlipidemia, unspecified: Secondary | ICD-10-CM | POA: Diagnosis present

## 2020-12-18 DIAGNOSIS — I120 Hypertensive chronic kidney disease with stage 5 chronic kidney disease or end stage renal disease: Secondary | ICD-10-CM | POA: Diagnosis present

## 2020-12-18 DIAGNOSIS — D849 Immunodeficiency, unspecified: Secondary | ICD-10-CM | POA: Diagnosis present

## 2020-12-18 DIAGNOSIS — R609 Edema, unspecified: Secondary | ICD-10-CM | POA: Diagnosis present

## 2020-12-18 DIAGNOSIS — A4181 Sepsis due to Enterococcus: Secondary | ICD-10-CM | POA: Diagnosis present

## 2020-12-18 DIAGNOSIS — I4901 Ventricular fibrillation: Secondary | ICD-10-CM | POA: Diagnosis not present

## 2020-12-18 DIAGNOSIS — Z8249 Family history of ischemic heart disease and other diseases of the circulatory system: Secondary | ICD-10-CM | POA: Diagnosis not present

## 2020-12-18 DIAGNOSIS — B179 Acute viral hepatitis, unspecified: Secondary | ICD-10-CM | POA: Diagnosis present

## 2020-12-18 DIAGNOSIS — L03113 Cellulitis of right upper limb: Secondary | ICD-10-CM | POA: Diagnosis present

## 2020-12-18 DIAGNOSIS — T8182XA Emphysema (subcutaneous) resulting from a procedure, initial encounter: Secondary | ICD-10-CM | POA: Diagnosis not present

## 2020-12-18 DIAGNOSIS — J9811 Atelectasis: Secondary | ICD-10-CM | POA: Diagnosis present

## 2020-12-18 DIAGNOSIS — K219 Gastro-esophageal reflux disease without esophagitis: Secondary | ICD-10-CM | POA: Diagnosis present

## 2020-12-18 DIAGNOSIS — Z8673 Personal history of transient ischemic attack (TIA), and cerebral infarction without residual deficits: Secondary | ICD-10-CM | POA: Diagnosis not present

## 2020-12-18 DIAGNOSIS — J9601 Acute respiratory failure with hypoxia: Secondary | ICD-10-CM | POA: Diagnosis present

## 2020-12-18 DIAGNOSIS — M329 Systemic lupus erythematosus, unspecified: Secondary | ICD-10-CM | POA: Diagnosis present

## 2020-12-18 DIAGNOSIS — Y848 Other medical procedures as the cause of abnormal reaction of the patient, or of later complication, without mention of misadventure at the time of the procedure: Secondary | ICD-10-CM | POA: Diagnosis not present

## 2020-12-18 LAB — BLOOD CULTURE ID PANEL (REFLEXED) - BCID2

## 2020-12-18 LAB — CBC
HCT: 31 % — ABNORMAL LOW (ref 36.0–46.0)
Hemoglobin: 9.8 g/dL — ABNORMAL LOW (ref 12.0–15.0)
MCH: 30.9 pg (ref 26.0–34.0)
MCHC: 31.6 g/dL (ref 30.0–36.0)
MCV: 97.8 fL (ref 80.0–100.0)
Platelets: 80 10*3/uL — ABNORMAL LOW (ref 150–400)
RBC: 3.17 MIL/uL — ABNORMAL LOW (ref 3.87–5.11)
RDW: 24.4 % — ABNORMAL HIGH (ref 11.5–15.5)
WBC: 20.4 10*3/uL — ABNORMAL HIGH (ref 4.0–10.5)
nRBC: 33.2 % — ABNORMAL HIGH (ref 0.0–0.2)

## 2020-12-18 LAB — COMPREHENSIVE METABOLIC PANEL
ALT: 127 U/L — ABNORMAL HIGH (ref 0–44)
AST: 297 U/L — ABNORMAL HIGH (ref 15–41)
Albumin: 2.7 g/dL — ABNORMAL LOW (ref 3.5–5.0)
Alkaline Phosphatase: 537 U/L — ABNORMAL HIGH (ref 38–126)
Anion gap: 16 — ABNORMAL HIGH (ref 5–15)
BUN: 61 mg/dL — ABNORMAL HIGH (ref 6–20)
CO2: 20 mmol/L — ABNORMAL LOW (ref 22–32)
Calcium: 8 mg/dL — ABNORMAL LOW (ref 8.9–10.3)
Chloride: 99 mmol/L (ref 98–111)
Creatinine, Ser: 7.75 mg/dL — ABNORMAL HIGH (ref 0.44–1.00)
GFR, Estimated: 6 mL/min — ABNORMAL LOW (ref >60)
Glucose, Bld: 131 mg/dL — ABNORMAL HIGH (ref 70–99)
Potassium: 5.5 mmol/L — ABNORMAL HIGH (ref 3.5–5.1)
Sodium: 135 mmol/L (ref 135–145)
Total Bilirubin: 4.9 mg/dL — ABNORMAL HIGH (ref 0.3–1.2)
Total Protein: 5.1 g/dL — ABNORMAL LOW (ref 6.5–8.1)

## 2020-12-18 LAB — DIC (DISSEMINATED INTRAVASCULAR COAGULATION)PANEL
D-Dimer, Quant: >20 ug/mL-FEU — ABNORMAL HIGH (ref 0.00–0.50)
Fibrinogen: 390 mg/dL (ref 210–475)
INR: 1.6 — ABNORMAL HIGH (ref 0.8–1.2)
Platelets: 72 10*3/uL — ABNORMAL LOW (ref 150–400)
Prothrombin Time: 18.3 seconds — ABNORMAL HIGH (ref 11.4–15.2)
aPTT: 34 seconds (ref 24–36)

## 2020-12-18 LAB — LACTIC ACID, PLASMA: Lactic Acid, Venous: 2 mmol/L (ref 0.5–1.9)

## 2020-12-18 LAB — GLUCOSE, CAPILLARY
Glucose-Capillary: 151 mg/dL — ABNORMAL HIGH (ref 70–99)
Glucose-Capillary: 121 mg/dL — ABNORMAL HIGH (ref 70–99)
Glucose-Capillary: 152 mg/dL — ABNORMAL HIGH (ref 70–99)

## 2020-12-18 LAB — SARS CORONAVIRUS 2 (TAT 6-24 HRS): SARS Coronavirus 2: POSITIVE — AB

## 2020-12-18 LAB — MRSA PCR SCREENING: MRSA by PCR: NEGATIVE

## 2020-12-18 LAB — MAGNESIUM: Magnesium: 2.4 mg/dL (ref 1.7–2.4)

## 2020-12-18 MED ORDER — MIDODRINE HCL 5 MG PO TABS
20.0000 mg | ORAL_TABLET | Freq: Three times a day (TID) | ORAL | Status: DC
Start: 2020-12-19 — End: 2020-12-19

## 2020-12-18 MED ORDER — ASPIRIN EC 81 MG PO TBEC: 81.0000 mg | DELAYED_RELEASE_TABLET | Freq: Every day | ORAL | Status: DC

## 2020-12-18 MED ORDER — IOHEXOL 9 MG/ML PO SOLN
ORAL | Status: AC
  Administered 2020-12-18: 500 mL via ORAL
  Filled 2020-12-18: qty 1000

## 2020-12-18 MED ORDER — METHYLPREDNISOLONE SODIUM SUCC 40 MG IJ SOLR
40.0000 mg | Freq: Two times a day (BID) | INTRAMUSCULAR | Status: DC
  Administered 2020-12-18: 40 mg via INTRAVENOUS
  Filled 2020-12-18: qty 1

## 2020-12-18 MED ORDER — PRISMASOL BGK 4/2.5 32-4-2.5 MEQ/L REPLACEMENT SOLN
Status: DC
  Filled 2020-12-18 (×2): qty 5000

## 2020-12-18 MED ORDER — CLOPIDOGREL BISULFATE 75 MG PO TABS: 75.0000 mg | ORAL_TABLET | Freq: Every day | ORAL | Status: DC

## 2020-12-18 MED ORDER — METHYLPREDNISOLONE SODIUM SUCC 40 MG IJ SOLR: 40.0000 mg | Freq: Every day | INTRAMUSCULAR | Status: DC

## 2020-12-18 MED ORDER — ALBUTEROL SULFATE HFA 108 (90 BASE) MCG/ACT IN AERS
1.0000 | INHALATION_SPRAY | RESPIRATORY_TRACT | Status: DC | PRN
  Filled 2020-12-18: qty 6.7

## 2020-12-18 MED ORDER — SODIUM ZIRCONIUM CYCLOSILICATE 5 G PO PACK
10.0000 g | PACK | Freq: Once | ORAL | Status: AC
  Administered 2020-12-18: 10 g via ORAL
  Filled 2020-12-18: qty 2

## 2020-12-18 MED ORDER — HEPARIN SODIUM (PORCINE) 5000 UNIT/ML IJ SOLN
5000.0000 [IU] | Freq: Three times a day (TID) | INTRAMUSCULAR | Status: DC
  Administered 2020-12-18: 5000 [IU] via SUBCUTANEOUS
  Filled 2020-12-18: qty 1

## 2020-12-18 MED ORDER — PRISMASOL BGK 4/2.5 32-4-2.5 MEQ/L EC SOLN
Status: DC
  Filled 2020-12-18 (×8): qty 5000

## 2020-12-18 MED ORDER — HEPARIN (PORCINE) 2000 UNITS/L FOR CRRT
INTRAVENOUS_CENTRAL | Status: DC | PRN
  Filled 2020-12-18: qty 1000

## 2020-12-18 MED ORDER — NOREPINEPHRINE 16 MG/250ML-% IV SOLN
0.0000 ug/min | INTRAVENOUS | Status: DC
  Administered 2020-12-18: 21:00:00 20 ug/min via INTRAVENOUS
  Administered 2020-12-19: 44 ug/min via INTRAVENOUS
  Filled 2020-12-18 (×2): qty 250

## 2020-12-18 MED ORDER — NOREPINEPHRINE 4 MG/250ML-% IV SOLN
0.0000 ug/min | INTRAVENOUS | Status: DC
  Administered 2020-12-18: 12 ug/min via INTRAVENOUS
  Filled 2020-12-18: qty 250

## 2020-12-18 MED ORDER — SODIUM CHLORIDE 0.9 % IV SOLN: 250.0000 mL | INTRAVENOUS | Status: DC

## 2020-12-18 MED ORDER — ATORVASTATIN CALCIUM 40 MG PO TABS: 40.0000 mg | ORAL_TABLET | Freq: Every day | ORAL | Status: DC

## 2020-12-18 MED ORDER — GUAIFENESIN-DM 100-10 MG/5ML PO SYRP: 5.0000 mL | ORAL_SOLUTION | ORAL | Status: DC | PRN

## 2020-12-18 MED ORDER — NOREPINEPHRINE 4 MG/250ML-% IV SOLN
2.0000 ug/min | INTRAVENOUS | Status: DC
  Administered 2020-12-18: 2 ug/min via INTRAVENOUS
  Administered 2020-12-18: 9 ug/min via INTRAVENOUS
  Filled 2020-12-18 (×2): qty 250

## 2020-12-18 MED ORDER — HEPARIN SODIUM (PORCINE) 1000 UNIT/ML DIALYSIS
1000.0000 [IU] | INTRAMUSCULAR | Status: DC | PRN
  Administered 2020-12-19: 3200 [IU] via INTRAVENOUS_CENTRAL
  Filled 2020-12-18: qty 4
  Filled 2020-12-18 (×2): qty 6

## 2020-12-18 MED ORDER — SODIUM CHLORIDE 0.9 % IV BOLUS
1000.0000 mL | Freq: Once | INTRAVENOUS | Status: AC
  Administered 2020-12-18: 1000 mL via INTRAVENOUS

## 2020-12-18 MED ORDER — SODIUM CHLORIDE 0.9 % IV SOLN
240.0000 mg | INTRAVENOUS | Status: DC
  Filled 2020-12-18: qty 4.8

## 2020-12-18 MED ORDER — IOHEXOL 9 MG/ML PO SOLN
500.0000 mL | ORAL | Status: AC
  Administered 2020-12-18: 500 mL via ORAL

## 2020-12-18 NOTE — Progress Notes (Signed)
PHARMACY - PHYSICIAN COMMUNICATION CRITICAL VALUE ALERT - BLOOD CULTURE IDENTIFICATION (BCID)  [name] is an 45 y.o. female who presented to [facility] on 11/27/2020 with a chief complaint of weakness  Assessment:  Blood culture growing enterococcus faecalis - no resistance detected.    Name of physician (or Provider) Contacted:S. Oletta Darter, MD  Current antibiotics: Daptomycin  Changes to prescribed antibiotics recommended:  Discussed with Dr. Oletta Darter.  Could narrow to ampicillin for blood culture, but also with abdominal pain.  Will keep on broader daptomycin for now.  May also need gram negative coverage if she worsens overnight.  Results for orders placed or performed during the hospital encounter of 11/20/2020  Blood Culture ID Panel (Reflexed) (Collected: 12/10/2020  6:13 PM)  Result Value Ref Range   Enterococcus faecalis DETECTED (A) NOT DETECTED   Enterococcus Faecium NOT DETECTED NOT DETECTED   Listeria monocytogenes NOT DETECTED NOT DETECTED   Staphylococcus species NOT DETECTED NOT DETECTED   Staphylococcus aureus (BCID) NOT DETECTED NOT DETECTED   Staphylococcus epidermidis NOT DETECTED NOT DETECTED   Staphylococcus lugdunensis NOT DETECTED NOT DETECTED   Streptococcus species NOT DETECTED NOT DETECTED   Streptococcus agalactiae NOT DETECTED NOT DETECTED   Streptococcus pneumoniae NOT DETECTED NOT DETECTED   Streptococcus pyogenes NOT DETECTED NOT DETECTED   A.calcoaceticus-baumannii NOT DETECTED NOT DETECTED   Bacteroides fragilis NOT DETECTED NOT DETECTED   Enterobacterales NOT DETECTED NOT DETECTED   Enterobacter cloacae complex NOT DETECTED NOT DETECTED   Escherichia coli NOT DETECTED NOT DETECTED   Klebsiella aerogenes NOT DETECTED NOT DETECTED   Klebsiella oxytoca NOT DETECTED NOT DETECTED   Klebsiella pneumoniae NOT DETECTED NOT DETECTED   Proteus species NOT DETECTED NOT DETECTED   Salmonella species NOT DETECTED NOT DETECTED   Serratia marcescens NOT  DETECTED NOT DETECTED   Haemophilus influenzae NOT DETECTED NOT DETECTED   Neisseria meningitidis NOT DETECTED NOT DETECTED   Pseudomonas aeruginosa NOT DETECTED NOT DETECTED   Stenotrophomonas maltophilia NOT DETECTED NOT DETECTED   Candida albicans NOT DETECTED NOT DETECTED   Candida auris NOT DETECTED NOT DETECTED   Candida glabrata NOT DETECTED NOT DETECTED   Candida krusei NOT DETECTED NOT DETECTED   Candida parapsilosis NOT DETECTED NOT DETECTED   Candida tropicalis NOT DETECTED NOT DETECTED   Cryptococcus neoformans/gattii NOT DETECTED NOT DETECTED   Vancomycin resistance NOT DETECTED NOT DETECTED   Uvaldo Rising, BCPS, BCCP Clinical Pharmacist  12/18/2020 10:04 PM   [facility] pharmacy phone numbers are listed on [email or web address]

## 2020-12-18 NOTE — H&P (Signed)
NAME:  [name], MRN:  [id number], DOB:  December 24, 1975, LOS: 0 ADMISSION DATE:  12/12/2020, CONSULTATION DATE:  12/18/20 REFERRING MD:  Manuella Ghazi - [facility], CHIEF COMPLAINT:  Shock, ESRD-- requiring CRRT   Brief History:  45 yo F ESRD with recent missed HD sessions admitted to [facility] with septic shock due to infected AVG, hypovolemia due to recent CDiff. Required transfer to [facility] for CRRT  History of Present Illness:  46 yo PMH ESRD on TTS HD, SLE, HTN, CDiff, who presented to [place] 1/29 after inability to receive HD due to hypotension. Patient had missed HD sessions on Thursday due to feeling weak (presented to ED for this) and again Saturday due to hypotension. Has not been eating or drinking well since discharge 1/22.  In ED 1/30, Patient found to have RUE swelling and infected AVG (placed 1/19) . Admitted to [facility] but due to shock, it was determined that patient would require CRRT and thus transfer to higher level of care with this therapeutic offering.   COVID-19 is positive, asymptomatic   K 5.3 Cr 6.4 BUN 51 Bicarb 18  Transaminases have increased x 1 day from Alk phos 385 AST 113 ALT 53 to 537 297 127 respectively.  TBili rising from 2.8 to 4.9  WBC 20 PCT 7.27 LA 2 Plt 87 to 80   1/30 transferred to [facility] for CRRT due to septic shock and ESRD requiring RRT  Past Medical History:  ESRD SLE CVA Depression GERD HLD HTN CDiff Avascular necrosis of L tibial talus  Secondary hyperparathyroidism  Significant Hospital Events:  1/29 presented to [facility] with hypotension after missing HD admitted to [facility]. Dapto started (vanc allergy) Fluids and NE 1/30 Transferring to [facility] for CRRT  Consults:  Nephrology   Procedures:  1/29 CVC>   Significant Diagnostic Tests:  1/29 CXR bibasilar atelectasis  1/30 RUQ [place]- concern for cirrhosis., small volume RUQ ascites  Micro Data:  1/29 COVID- positive 1/29 BCx> GPCs   Antimicrobials:  1/29 Dapto >>   Interim History / Subjective:  Transferred to [facility]  ICU   Objective   Blood pressure 106/81, pulse 73, temperature 98 F (36.7 C), temperature source Oral, resp. rate 19, height 5' 4"  (1.626 m), weight 59 kg, SpO2 95 %.        Intake/Output Summary (Last 24 hours) at 12/18/2020 1646 Last data filed at 12/18/2020 1443 Gross per 24 hour  Intake 1195.99 ml  Output --  Net 1195.99 ml   Filed Weights   11/26/2020 0920  Weight: 59 kg    Examination: General: Chronically ill appering middle aged F supine NAD HENT: exophthalmus. Anicteric sclera Pink mm trachea  midline Lungs: CTAb Cardiovascular: rrr some PVCs cap refill < 3 sec Abdomen: Round, periumbilical tenderness with rebound tenderness, no guarding,  Bowel sounds Extremities: RUE edema and warmth.  No cyanosis or clubbing  Neuro: lethargic, oriented x4 following commands GU: defer Skin: c/d/w. r fem CVC with oozing. R HD cath   Resolved Hospital Problem list     Assessment & Plan:    QASTM-19 infection  -minimally symptomatic, fully vax P -steroids.  -IS, ICU monitoring -Supplemental o2 as needed-- currently not requiring  -PRN robitussin, albuterol   Septic shock in immunocompromised patient--due to bacteremia, suspected AVG infection -GPCs in bcx. vanc allergy -RUQ with small volume ascites, not sure that SBP would be consideration here as well P -Dapto  -follow cx  -NE for MAP > 65 -Midodrine -if declines, consider adding mero  to dapto for SBP coverage -eventually will need line holiday  -AM ID consult   Thrombocytopenia -looks like had this on last admit as well, but in setting of septic shock, bacteremia will eval further P -DIC panel  -trend -ordinarily plt <50 indication for holding vte ppx-- I see TRH note stating to hold heparin agents, will d/w pharm (I don't see heparin allergy) and add if appropriate   ESRD on TTS HD Hyperkalemia -multiple missed HDs recently due to illness P -nephro -CRRT  -trend renal indices   Abdominal pain -reports  unchanged from CDiff infection (now s/p tx), however periumbilical rebound tenderness somewhat concerning for appendicitis (no hx appendectomy) P -consider CT a/p, very low threshold -- will d/w attending -serial abdominal exam   Transaminitis, worsening -shock vs hepatic process -concern for cirrhosis on [place]  P -RUQ [place] with concern for cirrhosis -small volume RUQ ascites -- if concern for SBT would add mero  -INR will result on DIC panel above  HTN -holding in setting of shock -ICU monitoring   HLD -lipitor at home  Lupus -on chronic steroids for about 11yrper pt.  P -Prednisone   Recent CVA -DAPT held due to thrombocytopenia P -ASA  Depression Insomnia  -Paxil  -Trazodone  Best practice (evaluated daily)  Diet: renal  Pain/Anxiety/Delirium protocol (if indicated): na VAP protocol (if indicated): na  DVT prophylaxis: SCDs SQH GI prophylaxis:protonix (on steroids) Glucose control: monitor Mobility: PT/OT Disposition:ICU  Goals of Care:  Last date of multidisciplinary goals of care discussion:-- Family and staff present: -- Summary of discussion: -- Follow up goals of care discussion due:2/6 Code Status: Full   Labs   CBC: Recent Labs  Lab 12/15/20 0747 12/14/2020 1034 12/18/20 0616  WBC 18.8* 20.0* 20.4*  NEUTROABS 14.9* 15.6*  --   HGB 9.4* 10.1* 9.8*  HCT 30.5* 32.0* 31.0*  MCV 95.9 97.6 97.8  PLT 86* 87* 80*    Basic Metabolic Panel: Recent Labs  Lab 12/15/20 0747 12/03/2020 1034 12/18/20 0616  NA 138 135 135  K 4.7 5.3* 5.5*  CL 99 99 99  CO2 20* 18* 20*  GLUCOSE 169* 128* 131*  BUN 59* 51* 61*  CREATININE 7.70* 6.51* 7.75*  CALCIUM 7.3* 7.4* 8.0*  MG  --   --  2.4   GFR: Estimated Creatinine Clearance: 8 mL/min (A) (by C-G formula based on SCr of 7.75 mg/dL (H)). Recent Labs  Lab 12/15/20 0747 12/15/20 0857 11/26/2020 1034 12/16/2020 1813 12/18/20 0616  PROCALCITON  --   --   --  7.27  --   WBC 18.8*  --  20.0*  --  20.4*   LATICACIDVEN  --  2.8*  --   --  2.0*    Liver Function Tests: Recent Labs  Lab 12/15/20 0747 12/01/2020 1034 12/18/20 0616  AST 41 113* 297*  ALT 26 53* 127*  ALKPHOS 355* 385* 537*  BILITOT 2.0* 2.8* 4.9*  PROT 5.3* 5.8* 5.1*  ALBUMIN 2.9* 3.1* 2.7*   No results for input(s): LIPASE, AMYLASE in the last 168 hours. No results for input(s): AMMONIA in the last 168 hours.  ABG    Component Value Date/Time   PHART 7.302 (L) 06/11/2011 0440   PCO2ART 49.9 (H) 06/11/2011 0440   PO2ART 77.2 (L) 06/11/2011 0440   HCO3 24.3 09/24/2019 1819   TCO2 29 12/07/2020 0707   ACIDBASEDEF 3.0 (H) 09/24/2019 1819   O2SAT 65.0 09/24/2019 1819     Coagulation Profile: No results for  input(s): INR, PROTIME in the last 168 hours.  Cardiac Enzymes: No results for input(s): CKTOTAL, CKMB, CKMBINDEX, TROPONINI in the last 168 hours.  HbA1C: Hgb A1c MFr Bld  Date/Time Value Ref Range Status  09/18/2020 02:01 AM 7.2 (H) 4.8 - 5.6 % Final    Comment:    (NOTE) Pre diabetes:          5.7%-6.4%  Diabetes:              >6.4%  Glycemic control for   <7.0% adults with diabetes   03/15/2020 03:52 AM 7.4 (H) 4.8 - 5.6 % Final    Comment:    (NOTE) Pre diabetes:          5.7%-6.4% Diabetes:              >6.4% Glycemic control for   <7.0% adults with diabetes     CBG: No results for input(s): GLUCAP in the last 168 hours.  Review of Systems:   + weakness, fatigue, lethargy -chest pain - SOB - palpitations + loss of appetite, poor PO intake, abdominal pain, nausea -diarrhea (following cdiff tx), constipation, bloody or tarry BM -changes to hair skin nails + HA - confusion - frequent falls   Past Medical History:  She,  has a past medical history of Allergy, Anemia, Anxiety, Arthritis, Avascular necrosis of bone ([place]), Cerebellar stroke ([place]), Depression, Dyspnea, ESRD (end stage renal disease) on dialysis ([place]), GERD (gastroesophageal reflux disease), Headache(784.0), History of  high blood pressure, Hyperlipemia, Hypertension, Lupus ([place]), Pneumonia, and Secondary hyperparathyroidism (of renal origin).   Surgical History:   Past Surgical History:  Procedure Laterality Date  . A/V FISTULAGRAM Left 12/02/2017   Procedure: A/V FISTULAGRAM - Left upper;  Surgeon: Angelia Mould, MD;  Location: [place] CV LAB;  Service: Cardiovascular;  Laterality: Left;  . A/V FISTULAGRAM N/A 10/27/2020   Procedure: A/V FISTULAGRAM - Right Upper;  Surgeon: Cherre Robins, MD;  Location: [place] CV LAB;  Service: Cardiovascular;  Laterality: N/A;  . AV FISTULA PLACEMENT Left   . AV FISTULA PLACEMENT Right 01/26/2014   Procedure: ARTERIOVENOUS (AV) FISTULA CREATION; ultrasound guided;  Surgeon: Conrad Ravia, MD;  Location: [place];  Service: Vascular;  Laterality: Right;  . AV FISTULA PLACEMENT Right 04/25/2020   Procedure: RIGHT UPPER ARM  BASILIC VEIN ARTERIOVENOUS (AV) FISTULA;  Surgeon: [name], MD;  Location: [place];  Service: Vascular;  Laterality: Right;  . AV FISTULA PLACEMENT Right 12/07/2020   Procedure: INSERTION OF RIGHT UPPER ARM ARTERIOVENOUS (AV) LOOP GRAFT;  Surgeon: [name], MD;  Location: [place];  Service: Vascular;  Laterality: Right;  . BASCILIC VEIN TRANSPOSITION Right 07/01/2020   Procedure: SECOND STAGE BASCILIC VEIN TRANSPOSITION RIGHT UPPER EXTREMITY;  Surgeon: [name], MD;  Location: [place];  Service: Vascular;  Laterality: Right;  . BUBBLE STUDY  09/20/2020   Procedure: BUBBLE STUDY;  Surgeon: Thayer Headings, MD;  Location: [facility] ENDOSCOPY;  Service: Cardiovascular;;  . ESOPHAGOGASTRODUODENOSCOPY N/A 03/17/2016   Procedure: ESOPHAGOGASTRODUODENOSCOPY (EGD);  Surgeon: Irene Shipper, MD;  Location: [facility] ENDOSCOPY;  Service: Endoscopy;  Laterality: N/A;  . FISTULOGRAM Left 09/24/2019   Procedure: FISTULOGRAM LEFT ARM;  Surgeon: [name], MD;  Location: [place];  Service: Vascular;  Laterality: Left;  . HEMATOMA  EVACUATION Left 10/09/2013   Procedure: EVACUATION HEMATOMA;  Surgeon: Angelia Mould, MD;  Location: [place];  Service: Vascular;  Laterality: Left;  . HEMATOMA EVACUATION Left 09/24/2019  Procedure: EVACUATION HEMATOMA  LEFT ARM;  Surgeon: [name], MD;  Location: [place];  Service: Vascular;  Laterality: Left;  . INCISION AND DRAINAGE ABSCESS     gluteal  . INSERTION OF DIALYSIS CATHETER N/A 10/09/2013   Procedure: INSERTION OF DIALYSIS CATHETER; ULTRASOUND GUIDED;  Surgeon: Angelia Mould, MD;  Location: [place];  Service: Vascular;  Laterality: N/A;  . INSERTION OF DIALYSIS CATHETER Left 09/24/2019   Procedure: INSERTION OF TUNNELLED DIALYSIS CATHETER - PALINDROME 15FR X 28CM;  Surgeon: [name], MD;  Location: [place];  Service: Vascular;  Laterality: Left;  . INSERTION OF DIALYSIS CATHETER Right 03/15/2020   Procedure: Insertion Of Dialysis Catheter;  Surgeon: Angelia Mould, MD;  Location: [place];  Service: Cardiovascular;  Laterality: Right;  . LOOP RECORDER INSERTION N/A 09/22/2020   Procedure: LOOP RECORDER INSERTION;  Surgeon: Thompson Grayer, MD;  Location: [place] CV LAB;  Service: Cardiovascular;  Laterality: N/A;  . PERIPHERAL VASCULAR BALLOON ANGIOPLASTY Left 12/02/2017   Procedure: PERIPHERAL VASCULAR BALLOON ANGIOPLASTY;  Surgeon: Angelia Mould, MD;  Location: [place] CV LAB;  Service: Cardiovascular;  Laterality: Left;  . PERIPHERAL VASCULAR BALLOON ANGIOPLASTY  10/27/2020   Procedure: PERIPHERAL VASCULAR BALLOON ANGIOPLASTY;  Surgeon: Cherre Robins, MD;  Location: [place] CV LAB;  Service: Cardiovascular;;  . REVISION OF ARTERIOVENOUS GORETEX GRAFT Left 04/03/2019   Procedure: REVISION OF ARTERIOVENOUS GORETEX GRAFT PSEUDOANEURYSM;  Surgeon: Angelia Mould, MD;  Location: [place];  Service: Vascular;  Laterality: Left;  . SHUNTOGRAM N/A 01/07/2014   Procedure: fistulogram with possibe venoplasty left upper arm avg;   Surgeon: Angelia Mould, MD;  Location: [facility] CATH LAB;  Service: Cardiovascular;  Laterality: N/A;  . TEE WITHOUT CARDIOVERSION N/A 09/20/2020   Procedure: TRANSESOPHAGEAL ECHOCARDIOGRAM (TEE);  Surgeon: Acie Fredrickson, Wonda Cheng, MD;  Location: [place];  Service: Cardiovascular;  Laterality: N/A;  . THROMBECTOMY AND REVISION OF ARTERIOVENTOUS (AV) GORETEX  GRAFT Left 03/15/2020   Procedure: ATTEMPTED THROMBECTOMY OF LEFT ARM ARTERIOVENTOUS (AV) GORETEX  GRAFT;  Surgeon: Angelia Mould, MD;  Location: [place];  Service: Cardiovascular;  Laterality: Left;  . tibiocalcaneal fusion  left Left   . ULTRASOUND GUIDANCE FOR VASCULAR ACCESS  09/24/2019   Procedure: Ultrasound Guidance For Vascular Access;  Surgeon: [name], MD;  Location: [place];  Service: Vascular;;     Social History:   reports that she has never smoked. She has never used smokeless tobacco. She reports that she does not drink alcohol and does not use drugs.   Family History:  Her family history includes Asthma in her sister; COPD in her maternal uncle; Heart attack (age of onset: 80) in her mother; Hypertension in her mother. There is no history of Mental illness.   Allergies Allergies  Allergen Reactions  . Amlodipine Hives and Swelling  . Sulfa Antibiotics Hives and Itching  . Vancomycin Hives and Itching  . Venlafaxine Other (See Comments)    Emotional     Home Medications  Prior to Admission medications   Medication Sig Start Date End Date Taking? Authorizing Provider  aspirin EC 325 MG EC tablet Take 1 tablet (325 mg total) by mouth daily. 09/23/20  Yes Gifford Shave, MD  atorvastatin (LIPITOR) 40 MG tablet Take 1 tablet (40 mg total) by mouth daily. 09/29/20  Yes Angiulli, Lavon Paganini, PA-C  b complex-vitamin c-folic acid (NEPHRO-VITE) 0.8 MG TABS tablet Take 1 tablet by mouth daily at 12 noon. Patient taking differently: Take  1 tablet by mouth daily. 09/29/20  Yes Angiulli, Lavon Paganini, PA-C  calcium  acetate, Phos Binder, (PHOSLYRA) 667 MG/5ML SOLN Take 15 mLs (2,001 mg total) by mouth 3 (three) times daily with meals. Patient taking differently: Take 2,001 mg by mouth See admin instructions. Take 15 mls (2001 mg) by mouth 3 times daily with meals & with each snacks. 09/29/20  Yes Angiulli, Lavon Paganini, PA-C  Carboxymethylcellul-Glycerin (LUBRICATING EYE DROPS OP) Place 1 drop into both eyes 2 (two) times daily as needed (dry/irritated eyes.).    Yes [provider]  clopidogrel (PLAVIX) 75 MG tablet Take 75 mg by mouth daily.   Yes [provider]  diltiazem (DILACOR XR) 240 MG 24 hr capsule Take 240 mg by mouth daily.   Yes [provider]  fidaxomicin (DIFICID) 200 MG TABS tablet Take 1 tablet (200 mg total) by mouth 2 (two) times daily. 12/10/20  Yes Kathie Dike, MD  hydrALAZINE (APRESOLINE) 25 MG tablet Take 25 mg by mouth 2 (two) times daily.   Yes [provider]  HYDROcodone-acetaminophen (NORCO) 5-325 MG tablet Take 1 tablet by mouth every 6 (six) hours as needed for moderate pain. 12/07/20  Yes Dagoberto Ligas, PA-C  meclizine (ANTIVERT) 25 MG tablet Take 25 mg by mouth 3 (three) times daily as needed for dizziness or nausea.   Yes [provider]  metoprolol succinate (TOPROL-XL) 25 MG 24 hr tablet Take 25 mg by mouth daily. 11/01/20  Yes [provider]  ondansetron (ZOFRAN-ODT) 4 MG disintegrating tablet Take 4 mg by mouth daily as needed for nausea or vomiting.   Yes [provider]  pantoprazole (PROTONIX) 40 MG tablet Take 1 tablet (40 mg total) by mouth daily. 09/29/20  Yes Angiulli, Lavon Paganini, PA-C  PARoxetine (PAXIL) 40 MG tablet Take 1 tablet (40 mg total) by mouth daily. 09/29/20  Yes Angiulli, Lavon Paganini, PA-C  predniSONE (DELTASONE) 10 MG tablet Take 1 tablet (10 mg total) by mouth daily with breakfast. Patient taking differently: Take 10 mg by mouth daily with breakfast. For Lupus 09/29/20  Yes Angiulli, Lavon Paganini,  PA-C  traZODone (DESYREL) 50 MG tablet Take 50 mg by mouth at bedtime.   Yes [provider]  Darbepoetin Alfa (ARANESP) 60 MCG/0.3ML SOSY injection Inject 0.3 mLs (60 mcg total) into the vein every Tuesday with hemodialysis. 10/04/20   Angiulli, Lavon Paganini, PA-C  traZODone (DESYREL) 100 MG tablet Take 1 tablet (100 mg total) by mouth at bedtime. Patient not taking: Reported on 11/19/2020 09/29/20   Cathlyn Parsons, PA-C     Critical care time: 47 min    CRITICAL CARE Performed by: Cristal Generous   Total critical care time: 47 minutes  Critical care time was exclusive of separately billable procedures and treating other patients. Critical care was necessary to treat or prevent imminent or life-threatening deterioration.  Critical care was time spent personally by me on the following activities: development of treatment plan with patient and/or surrogate as well as nursing, discussions with consultants, evaluation of patient's response to treatment, examination of patient, obtaining history from patient or surrogate, ordering and performing treatments and interventions, ordering and review of laboratory studies, ordering and review of radiographic studies, pulse oximetry and re-evaluation of patient's condition.  Eliseo Gum MSN, AGACNP-BC [place] [phone] If no answer, [phone] 12/18/2020, 5:53 PM

## 2020-12-18 NOTE — Progress Notes (Signed)
[facility] Progress Note   Subjective:   Requiring vasopressor support today . Blood cx with GPCs.  COVID 19+  Objective Vitals:   12/18/20 1245 12/18/20 1315 12/18/20 1345 12/18/20 1400  BP: 112/79 1'08/76 94/74 97/70 '$  Pulse: 76 77 76   Resp: '18 15 19 18  '$ Temp:      TempSrc:      SpO2: 100% 100% 97%   Weight:      Height:       Additional Objective Labs: Basic Metabolic Panel: Recent Labs  Lab 12/15/20 0747 12/15/2020 1034 12/18/20 0616  NA 138 135 135  K 4.7 5.3* 5.5*  CL 99 99 99  CO2 20* 18* 20*  GLUCOSE 169* 128* 131*  BUN 59* 51* 61*  CREATININE 7.70* 6.51* 7.75*  CALCIUM 7.3* 7.4* 8.0*   Liver Function Tests: Recent Labs  Lab 12/15/20 0747 12/12/2020 1034 12/18/20 0616  AST 41 113* 297*  ALT 26 53* 127*  ALKPHOS 355* 385* 537*  BILITOT 2.0* 2.8* 4.9*  PROT 5.3* 5.8* 5.1*  ALBUMIN 2.9* 3.1* 2.7*   No results for input(s): LIPASE, AMYLASE in the last 168 hours. CBC: Recent Labs  Lab 12/15/20 0747 11/29/2020 1034 12/18/20 0616  WBC 18.8* 20.0* 20.4*  NEUTROABS 14.9* 15.6*  --   HGB 9.4* 10.1* 9.8*  HCT 30.5* 32.0* 31.0*  MCV 95.9 97.6 97.8  PLT 86* 87* 80*   Blood Culture    Component Value Date/Time   SDES  11/24/2020 1813    BLOOD BOTTLES DRAWN AEROBIC AND ANAEROBIC Blood Culture adequate volume A-LINE DRAW   SPECREQUEST NONE 11/22/2020 1813   CULT PENDING 11/24/2020 1813   REPTSTATUS PENDING 12/01/2020 1813    Cardiac Enzymes: No results for input(s): CKTOTAL, CKMB, CKMBINDEX, TROPONINI in the last 168 hours. CBG: No results for input(s): GLUCAP in the last 168 hours. Iron Studies: No results for input(s): IRON, TIBC, TRANSFERRIN, FERRITIN in the last 72 hours. '@lablastinr3'$ @ Studies/Results: DG Chest Portable 1 View  Result Date: 12/01/2020 CLINICAL DATA:  Weakness.  Hypotension.  Unable to go to dialysis. EXAM: PORTABLE CHEST 1 VIEW COMPARISON:  12/15/2020 FINDINGS: Right jugular double-lumen catheter tips in the right  atrium, unchanged. Normal sized heart. Mildly increased bibasilar atelectasis. Stable left upper extremity pain dialysis fistula. Mild scoliosis. 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 IMPRESSION: Mildly increased bibasilar atelectasis. Electronically Signed   By: Claudie Revering M.D.   On: 12/02/2020 10:16   US Abdomen Limited RUQ (LIVER/GB)  Result Date: 12/18/2020 CLINICAL DATA:  Transaminitis EXAM: ULTRASOUND ABDOMEN LIMITED RIGHT UPPER QUADRANT COMPARISON:  06/08/2011 FINDINGS: Gallbladder: Collapsed gallbladder which accounts for wall thickening. No gallstone or cholecystitis. Common bile duct: Diameter: 2 mm. Liver: No focal lesion identified. Within normal limits in parenchymal echogenicity. Mild surface lobulation best seen adjacent to areas of ascites. Portal vein is patent on color Doppler imaging with normal direction of blood flow towards the liver. Other: Small volume ascites in the right upper quadrant. IMPRESSION: 1. Concern for cirrhosis. There is small volume ascites in the right upper quadrant. 2. Negative gallbladder. Electronically Signed   By: Monte Fantasia M.D.   On: 12/18/2020 10:20   Medications: . sodium chloride    . sodium chloride Stopped (12/18/20 0004)  . sodium chloride Stopped (12/18/20 0114)  . [START ON January 11, 2021] DAPTOmycin (CUBICIN)  IV    . norepinephrine (LEVOPHED) Adult infusion     . calcium acetate  2,001 mg Oral TID WC  . calcium acetate  2,001 mg Oral TID BM  . Chlorhexidine Gluconate Cloth  6 each Topical Q0600  . methylPREDNISolone (SOLU-MEDROL) injection  40 mg Intravenous Q12H  . midodrine  10 mg Oral TID WC  . pantoprazole  40 mg Oral Daily  . PARoxetine  40 mg Oral Daily  . predniSONE  10 mg Oral Q breakfast  . sodium chloride flush  10-40 mL Intracatheter Q12H  . sodium chloride flush  3 mL Intravenous Q12H  . traZODone  50 mg Oral QHS      Dialysis Orders:as of last 09/2020 [facility] TTS 3h 400/600 2K/2.5Ca EDW  58kg TDC Hep bolus 1800  Assessment/Plan: 74F ESRD, h/o SLE, recent AVG surgery, admitted with sepsis now septic shock, RUE cellulitis around AVG site, COVID 19 +  **Septic shock:  Hasn't responded to fluid challenge and now on vasopressor support; lactate 2.   Cultures pending.  Abx per primary. Would give stress dose steroids give baseline prednisone daily use.   Blood cultures with GPC currently. Checking [place] of AVG to r/o fluid collection that would need to be drained given local cellulitis.   **COVID 19+:  Steroids per protocol while she is on 2L.  CXR without active disease.   **ESRD on HD:  Last HD really Tues.  In light of hypotension now being transferred to [facility] for CRRT - orders placed.  No heparin with low plt.  UFR 5m/hr for now.   **Hyperkalemia: 5.5, give lokelma now as unclear transfer timeline.   **Anemia: Hb 9.8. Monitor  **BMM: on binder. Corr ca ok.   Page with issues.   LJannifer HickMD 12/18/2020, 2:14 PM  [facility] Pager: [phone]

## 2020-12-18 NOTE — Progress Notes (Signed)
PROGRESS NOTE    [name]  [id number] DOB: 02/28/1976 DOA: 11/24/2020 PCP: Leeanne Rio, MD   Brief Narrative:  Per HPI: [name] is a 45 y.o. female with medical history significant for ESRD on HD TTS, recent right arm AV graft on 1/19, recent C. difficile diarrhea, hypertension, and lupus who presented to the ED after she could no receive hemodialysis due to low blood pressure readings.  She was seen in the ED 2 days ago for weakness and had missed her dialysis session on Thursday due to her symptoms.  She has not been eating or drinking very well and overall has not felt too well since her discharge on 1/22 when she was admitted for acute blood loss anemia related to her recent procedure as well as C. difficile diarrhea.  She received 1 unit of PRBC at that time and was discharged with Dificid and claims to have completed her course of antibiotics.  She denies any further abdominal pain or diarrhea, and states that she has not had any fever or chills.  She states that her right arm swelling has been improving over the course of the last several days and she denies any pain or discharge from the area.  She has been taking her home medications otherwise as prescribed.  -Patient was admitted with severe sepsis secondary to right arm cellulitis/AV graft infection and was noted to have some hypotension on admission.  She has required central venous line placement and has required pressor use with norepinephrine overnight.  Hemodialysis was planned for 1/30, but she will now need transfer to critical care with CRRT and ongoing antibiotics and pressor management.  She is also noted to be Covid positive and will be started on IV Solu-Medrol as she is also requiring 2 L nasal cannula.   Assessment & Plan:   Active Problems:   End stage renal disease on dialysis (HCC)   Hypotension   Septic shock secondary to right arm cellulitis/AV graft infection -Recent right arm AV  graft on 1/19, may require vascular evaluation -Continue on daptomycin as prescribed -Blood cultures thus far with no growth -Continue to monitor repeat labs -Avoiding aggressive IV fluid as patient is ESRD, patient has received approximately 750 mL of fluid -Continue norepinephrine for pressor support, continue midodrine  ESRD on HD with missed hemodialysis sessions -Now with hyperkalemia and acidosis -Nephrology consulted with hemodialysis while inpatient -Due to hypotension with shock state, discussed case with nephrology and they agree with need for CRRT  Mild acute hypoxemic respiratory failure in the setting of COVID-19 infection -No significant symptomatology noted, plan to hold remdesivir given LFT elevation -IV steroids ordered -Albuterol as needed for wheezing with cough -Robitussin as needed -Monitor inflammatory markers as ordered  Elevated alkaline phosphatase and mild LFT elevation-uptrending -May be related to hypotension, no significant GI symptoms -Hyperbilirubinemia also noted, but no significant anemia.  Plan for further fractionation as well as LDH -Hold statin will monitor repeat labs -Right upper quadrant ultrasound pending  Recent C. difficile diarrhea -Patient states that she has completed course of Dificid and has no further diarrhea symptoms  History of lupus -Continue prednisone  Thrombocytopenia -Avoid heparin agents -Hold Aspirin and Plavix  GERD -Continue Protonix  DVT prophylaxis:SCDs Code Status: Full Family Communication: Patient states she will call. Disposition Plan: Discussed care with PCCM Dr. Tacy Learn who accepts in transfer. ICU Bed available. Status is: Observation  The patient will require care spanning > 2 midnights and should be moved  to inpatient because: Hemodynamically unstable, IV treatments appropriate due to intensity of illness or inability to take PO and Inpatient level of care appropriate due to severity of  illness  Dispo: The patient is from: Home              Anticipated d/c is to: Home              Anticipated d/c date is: > 3 days              Patient currently is not medically stable to d/c.   Difficult to place patient No   Nutritional Assessment:  The patient's BMI is: Body mass index is 22.31 kg/m.Marland Kitchen  Consultants:   Nephrology  Procedures:   R femoral central line placement by GS 1/29  See below  Antimicrobials:  Anti-infectives (From admission, onward)   Start     Dose/Rate Route Frequency Ordered Stop   12/15/2020 1800  DAPTOmycin (CUBICIN) 240 mg in sodium chloride 0.9 % IVPB        240 mg 209.6 mL/hr over 30 Minutes Intravenous  Once 12/05/2020 1642 12/08/2020 2229      Subjective: Patient seen and evaluated today and overall appears weak and tired.  Worsening hypotension noted overnight for which she was started on norepinephrine.  Objective: Vitals:   12/18/20 0530 12/18/20 0600 12/18/20 0730 12/18/20 0745  BP: 102/74 110/74 100/72 97/69  Pulse: 76 77 76   Resp: '14 14 13 12  '$ Temp:      TempSrc:      SpO2: 100% 100% 98%   Weight:      Height:        Intake/Output Summary (Last 24 hours) at 12/18/2020 0848 Last data filed at 12/18/2020 0124 Gross per 24 hour  Intake 605.13 ml  Output -  Net 605.13 ml   Filed Weights   11/28/2020 0920  Weight: 59 kg    Examination:  General exam: Appears calm and comfortable  Respiratory system: Clear to auscultation. Respiratory effort normal.  Currently on 2 L nasal cannula Cardiovascular system: S1 & S2 heard, RRR.  Gastrointestinal system: Abdomen is soft Central nervous system: Alert and awake Extremities: Right arm swelling, warmth and erythema noted. Skin: No significant lesions noted Psychiatry: Flat affect.    Data Reviewed: I have personally reviewed following labs and imaging studies  CBC: Recent Labs  Lab 12/15/20 0747 11/26/2020 1034 12/18/20 0616  WBC 18.8* 20.0* 20.4*  NEUTROABS 14.9* 15.6*   --   HGB 9.4* 10.1* 9.8*  HCT 30.5* 32.0* 31.0*  MCV 95.9 97.6 97.8  PLT 86* 87* 80*   Basic Metabolic Panel: Recent Labs  Lab 12/15/20 0747 11/23/2020 1034 12/18/20 0616  NA 138 135 135  K 4.7 5.3* 5.5*  CL 99 99 99  CO2 20* 18* 20*  GLUCOSE 169* 128* 131*  BUN 59* 51* 61*  CREATININE 7.70* 6.51* 7.75*  CALCIUM 7.3* 7.4* 8.0*  MG  --   --  2.4   GFR: Estimated Creatinine Clearance: 8 mL/min (A) (by C-G formula based on SCr of 7.75 mg/dL (H)). Liver Function Tests: Recent Labs  Lab 12/15/20 0747 11/30/2020 1034 12/18/20 0616  AST 41 113* 297*  ALT 26 53* 127*  ALKPHOS 355* 385* 537*  BILITOT 2.0* 2.8* 4.9*  PROT 5.3* 5.8* 5.1*  ALBUMIN 2.9* 3.1* 2.7*   No results for input(s): LIPASE, AMYLASE in the last 168 hours. No results for input(s): AMMONIA in the last 168 hours. Coagulation Profile: No  results for input(s): INR, PROTIME in the last 168 hours. Cardiac Enzymes: No results for input(s): CKTOTAL, CKMB, CKMBINDEX, TROPONINI in the last 168 hours. BNP (last 3 results) No results for input(s): PROBNP in the last 8760 hours. HbA1C: No results for input(s): HGBA1C in the last 72 hours. CBG: No results for input(s): GLUCAP in the last 168 hours. Lipid Profile: No results for input(s): CHOL, HDL, LDLCALC, TRIG, CHOLHDL, LDLDIRECT in the last 72 hours. Thyroid Function Tests: No results for input(s): TSH, T4TOTAL, FREET4, T3FREE, THYROIDAB in the last 72 hours. Anemia Panel: No results for input(s): VITAMINB12, FOLATE, FERRITIN, TIBC, IRON, RETICCTPCT in the last 72 hours. Sepsis Labs: Recent Labs  Lab 12/15/20 0857 12/14/2020 1813 12/18/20 0616  PROCALCITON  --  7.27  --   LATICACIDVEN 2.8*  --  2.0*    Recent Results (from the past 240 hour(s))  Gastrointestinal Panel by PCR , Stool     Status: None   Collection Time: 12/08/20  2:39 PM   Specimen: Stool  Result Value Ref Range Status   Campylobacter species NOT DETECTED NOT DETECTED Final   Plesimonas  shigelloides NOT DETECTED NOT DETECTED Final   Salmonella species NOT DETECTED NOT DETECTED Final   Yersinia enterocolitica NOT DETECTED NOT DETECTED Final   Vibrio species NOT DETECTED NOT DETECTED Final   Vibrio cholerae NOT DETECTED NOT DETECTED Final   Enteroaggregative E coli (EAEC) NOT DETECTED NOT DETECTED Final   Enteropathogenic E coli (EPEC) NOT DETECTED NOT DETECTED Final   Enterotoxigenic E coli (ETEC) NOT DETECTED NOT DETECTED Final   Shiga like toxin producing E coli (STEC) NOT DETECTED NOT DETECTED Final   Shigella/Enteroinvasive E coli (EIEC) NOT DETECTED NOT DETECTED Final   Cryptosporidium NOT DETECTED NOT DETECTED Final   Cyclospora cayetanensis NOT DETECTED NOT DETECTED Final   Entamoeba histolytica NOT DETECTED NOT DETECTED Final   Giardia lamblia NOT DETECTED NOT DETECTED Final   Adenovirus F40/41 NOT DETECTED NOT DETECTED Final   Astrovirus NOT DETECTED NOT DETECTED Final   Norovirus GI/GII NOT DETECTED NOT DETECTED Final   Rotavirus A NOT DETECTED NOT DETECTED Final   Sapovirus (I, II, IV, and V) NOT DETECTED NOT DETECTED Final    Comment: Performed at [facility], [place] [address] [place], [place] [address]  C Difficile Quick Screen (NO PCR Reflex)     Status: Abnormal   Collection Time: 12/08/20  2:39 PM   Specimen: Stool  Result Value Ref Range Status   C Diff antigen POSITIVE (A) NEGATIVE Final   C Diff toxin NEGATIVE NEGATIVE Final   C Diff interpretation   Final    Results are indeterminate. Please contact the provider listed for your campus for C diff questions in [place].    Comment: Performed at [facility], [address] [place], [place] [address]  SARS CORONAVIRUS 2 (TAT 6-24 HRS) Nasopharyngeal Nasopharyngeal Swab     Status: None   Collection Time: 12/08/20  2:42 PM   Specimen: Nasopharyngeal Swab  Result Value Ref Range Status   SARS Coronavirus 2 NEGATIVE NEGATIVE Final    Comment: (NOTE) SARS-CoV-2 target nucleic acids are NOT  DETECTED.  The SARS-CoV-2 RNA is generally detectable in upper and lower respiratory specimens during the acute phase of infection. Negative results do not preclude SARS-CoV-2 infection, do not rule out co-infections with other pathogens, and should not be used as the sole basis for treatment or other patient management decisions. Negative results must be combined with clinical observations, patient  history, and epidemiological information. The expected result is Negative.  Fact Sheet for Patients: SugarRoll.be  Fact Sheet for Healthcare Providers: https://www.woods-mathews.com/  This test is not yet approved or cleared by the [place] FDA and  has been authorized for detection and/or diagnosis of SARS-CoV-2 by FDA under an Emergency Use Authorization (EUA). This EUA will remain  in effect (meaning this test can be used) for the duration of the COVID-19 declaration under Se ction 564(b)(1) of the Act, 21 U.S.C. section 360bbb-3(b)(1), unless the authorization is terminated or revoked sooner.  Performed at [facility], [place] [address] [place], [place] [address]   C. Diff by PCR     Status: Abnormal   Collection Time: 12/09/20  2:26 PM   Specimen: STOOL  Result Value Ref Range Status   Toxigenic C. Difficile by PCR POSITIVE (A) NEGATIVE Final    Comment: Positive for toxigenic C. difficile with little to no toxin production. Only treat if clinical presentation suggests symptomatic illness. Performed at [facility], [place] [address], [place], [place] [address]   SARS Coronavirus 2 by RT PCR (hospital order, performed in [facility] hospital lab) Nasopharyngeal Nasopharyngeal Swab     Status: None   Collection Time: 12/15/20 10:09 AM   Specimen: Nasopharyngeal Swab  Result Value Ref Range Status   SARS Coronavirus 2 NEGATIVE NEGATIVE Final    Comment: (NOTE) SARS-CoV-2 target nucleic acids are NOT DETECTED.  The  SARS-CoV-2 RNA is generally detectable in upper and lower respiratory specimens during the acute phase of infection. The lowest concentration of SARS-CoV-2 viral copies this assay can detect is 250 copies / mL. A negative result does not preclude SARS-CoV-2 infection and should not be used as the sole basis for treatment or other patient management decisions.  A negative result may occur with improper specimen collection / handling, submission of specimen other than nasopharyngeal swab, presence of viral mutation(s) within the areas targeted by this assay, and inadequate number of viral copies (<250 copies / mL). A negative result must be combined with clinical observations, patient history, and epidemiological information.  Fact Sheet for Patients:   StrictlyIdeas.no  Fact Sheet for Healthcare Providers: BankingDealers.co.za  This test is not yet approved or  cleared by the [place] FDA and has been authorized for detection and/or diagnosis of SARS-CoV-2 by FDA under an Emergency Use Authorization (EUA).  This EUA will remain in effect (meaning this test can be used) for the duration of the COVID-19 declaration under Section 564(b)(1) of the Act, 21 U.S.C. section 360bbb-3(b)(1), unless the authorization is terminated or revoked sooner.  Performed at [facility], [place] [address], [place], [place] [address]   SARS CORONAVIRUS 2 (TAT 6-24 HRS) Nasopharyngeal Nasopharyngeal Swab     Status: Abnormal   Collection Time: 11/23/2020  3:39 PM   Specimen: Nasopharyngeal Swab  Result Value Ref Range Status   SARS Coronavirus 2 POSITIVE (A) NEGATIVE Final    Comment: (NOTE) SARS-CoV-2 target nucleic acids are DETECTED.  The SARS-CoV-2 RNA is generally detectable in upper and lower respiratory specimens during the acute phase of infection. Positive results are indicative of the presence of SARS-CoV-2 RNA. Clinical correlation with  patient history and other diagnostic information is  necessary to determine patient infection status. Positive results do not rule out bacterial infection or co-infection with other viruses.  The expected result is Negative.  Fact Sheet for Patients: SugarRoll.be  Fact Sheet for Healthcare Providers: https://www.woods-mathews.com/  This test  is not yet approved or cleared by the [place] and  has been authorized for detection and/or diagnosis of SARS-CoV-2 by FDA under an Emergency Use Authorization (EUA). This EUA will remain  in effect (meaning this test can be used) for the duration of the COVID-19 declaration under Section 564(b)(1) of the Act, 21 U. S.C. section 360bbb-3(b)(1), unless the authorization is terminated or revoked sooner.   Performed at [facility], [place] [address] [place], [place] [address]   Culture, blood (routine x 2)     Status: None (Preliminary result)   Collection Time: 11/22/2020  6:13 PM   Specimen: BLOOD  Result Value Ref Range Status   Specimen Description   Final    BLOOD BOTTLES DRAWN AEROBIC AND ANAEROBIC Blood Culture adequate volume A-LINE DRAW   Special Requests NONE  Final   Culture   Final    NO GROWTH < 12 HOURS Performed at [facility], [address] [place] [address] [place], [place] [address]    Report Status PENDING  Incomplete         Radiology Studies: DG Chest Portable 1 View  Result Date: 11/22/2020 CLINICAL DATA:  Weakness.  Hypotension.  Unable to go to dialysis. EXAM: PORTABLE CHEST 1 VIEW COMPARISON:  12/15/2020 FINDINGS: Right jugular double-lumen catheter tips in the right atrium, unchanged. Normal sized heart. Mildly increased bibasilar atelectasis. Stable left upper extremity pain dialysis fistula. Mild scoliosis. 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 IMPRESSION: Mildly increased bibasilar atelectasis. Electronically Signed   By: Claudie Revering M.D.   On:  12/14/2020 10:16        Scheduled Meds: . calcium acetate  2,001 mg Oral TID WC  . calcium acetate  2,001 mg Oral TID BM  . Chlorhexidine Gluconate Cloth  6 each Topical Q0600  . methylPREDNISolone (SOLU-MEDROL) injection  40 mg Intravenous Q12H  . midodrine  10 mg Oral TID WC  . pantoprazole  40 mg Oral Daily  . PARoxetine  40 mg Oral Daily  . predniSONE  10 mg Oral Q breakfast  . sodium chloride flush  10-40 mL Intracatheter Q12H  . sodium chloride flush  3 mL Intravenous Q12H  . traZODone  50 mg Oral QHS   Continuous Infusions: . sodium chloride    . sodium chloride Stopped (12/18/20 0004)  . sodium chloride Stopped (12/18/20 0114)  . norepinephrine (LEVOPHED) Adult infusion 9 mcg/min (12/18/20 0150)     LOS: 0 days    Time spent: 35 minutes    Aleena Kirkeby Darleen Crocker, DO [facility]  If 7PM-7AM, please contact night-coverage [email or web address] 12/18/2020, 8:48 AM

## 2020-12-18 NOTE — ED Notes (Signed)
Date and time results received: 12/18/20 1126   Test: aerobic blood culture bottle Critical Value: grew gram + cocci  Name of Provider Notified: Dr. Manuella Ghazi  Orders Received? Or Actions Taken?: Orders Received - See Orders for details

## 2020-12-19 ENCOUNTER — Other Ambulatory Visit (HOSPITAL_COMMUNITY): Payer: Medicare Other

## 2020-12-19 ENCOUNTER — Inpatient Hospital Stay (HOSPITAL_COMMUNITY): Payer: Medicare Other | Admitting: Certified Registered Nurse Anesthetist

## 2020-12-19 ENCOUNTER — Inpatient Hospital Stay (HOSPITAL_COMMUNITY): Payer: Medicare Other

## 2020-12-19 DIAGNOSIS — B179 Acute viral hepatitis, unspecified: Secondary | ICD-10-CM | POA: Diagnosis present

## 2020-12-19 DIAGNOSIS — L8990 Pressure ulcer of unspecified site, unspecified stage: Secondary | ICD-10-CM | POA: Insufficient documentation

## 2020-12-19 DIAGNOSIS — U071 COVID-19: Secondary | ICD-10-CM | POA: Diagnosis present

## 2020-12-19 DIAGNOSIS — I469 Cardiac arrest, cause unspecified: Secondary | ICD-10-CM | POA: Diagnosis not present

## 2020-12-19 DIAGNOSIS — R6521 Severe sepsis with septic shock: Secondary | ICD-10-CM | POA: Diagnosis not present

## 2020-12-19 DIAGNOSIS — D849 Immunodeficiency, unspecified: Secondary | ICD-10-CM | POA: Diagnosis present

## 2020-12-19 DIAGNOSIS — A419 Sepsis, unspecified organism: Secondary | ICD-10-CM | POA: Diagnosis not present

## 2020-12-19 DIAGNOSIS — B952 Enterococcus as the cause of diseases classified elsewhere: Secondary | ICD-10-CM | POA: Diagnosis present

## 2020-12-19 LAB — CBC WITH DIFFERENTIAL/PLATELET
Abs Immature Granulocytes: 0.31 10*3/uL — ABNORMAL HIGH (ref 0.00–0.07)
Basophils Absolute: 0 10*3/uL (ref 0.0–0.1)
Basophils Relative: 0 %
Eosinophils Absolute: 0 10*3/uL (ref 0.0–0.5)
Eosinophils Relative: 0 %
HCT: 27.8 % — ABNORMAL LOW (ref 36.0–46.0)
Hemoglobin: 8.4 g/dL — ABNORMAL LOW (ref 12.0–15.0)
Immature Granulocytes: 3 %
Lymphocytes Relative: 2 %
Lymphs Abs: 0.3 10*3/uL — ABNORMAL LOW (ref 0.7–4.0)
MCH: 30.3 pg (ref 26.0–34.0)
MCHC: 30.2 g/dL (ref 30.0–36.0)
MCV: 100.4 fL — ABNORMAL HIGH (ref 80.0–100.0)
Monocytes Absolute: 1.2 10*3/uL — ABNORMAL HIGH (ref 0.1–1.0)
Monocytes Relative: 10 %
Neutro Abs: 10.5 10*3/uL — ABNORMAL HIGH (ref 1.7–7.7)
Neutrophils Relative %: 85 %
Platelets: 51 10*3/uL — ABNORMAL LOW (ref 150–400)
RBC: 2.77 MIL/uL — ABNORMAL LOW (ref 3.87–5.11)
RDW: 24.8 % — ABNORMAL HIGH (ref 11.5–15.5)
WBC: 12.3 10*3/uL — ABNORMAL HIGH (ref 4.0–10.5)
nRBC: 34.3 % — ABNORMAL HIGH (ref 0.0–0.2)

## 2020-12-19 LAB — RENAL FUNCTION PANEL
Albumin: 2.2 g/dL — ABNORMAL LOW (ref 3.5–5.0)
Albumin: 2.6 g/dL — ABNORMAL LOW (ref 3.5–5.0)
Anion gap: 21 — ABNORMAL HIGH (ref 5–15)
Anion gap: 21 — ABNORMAL HIGH (ref 5–15)
BUN: 39 mg/dL — ABNORMAL HIGH (ref 6–20)
BUN: 55 mg/dL — ABNORMAL HIGH (ref 6–20)
CO2: 12 mmol/L — ABNORMAL LOW (ref 22–32)
CO2: 14 mmol/L — ABNORMAL LOW (ref 22–32)
Calcium: 7 mg/dL — ABNORMAL LOW (ref 8.9–10.3)
Calcium: 8.3 mg/dL — ABNORMAL LOW (ref 8.9–10.3)
Chloride: 101 mmol/L (ref 98–111)
Chloride: 106 mmol/L (ref 98–111)
Creatinine, Ser: 4.4 mg/dL — ABNORMAL HIGH (ref 0.44–1.00)
Creatinine, Ser: 6.75 mg/dL — ABNORMAL HIGH (ref 0.44–1.00)
GFR, Estimated: 12 mL/min — ABNORMAL LOW (ref >60)
GFR, Estimated: 7 mL/min — ABNORMAL LOW (ref >60)
Glucose, Bld: 141 mg/dL — ABNORMAL HIGH (ref 70–99)
Glucose, Bld: 83 mg/dL (ref 70–99)
Phosphorus: 10 mg/dL — ABNORMAL HIGH (ref 2.5–4.6)
Phosphorus: 7 mg/dL — ABNORMAL HIGH (ref 2.5–4.6)
Potassium: 4.8 mmol/L (ref 3.5–5.1)
Potassium: 6.4 mmol/L (ref 3.5–5.1)
Sodium: 136 mmol/L (ref 135–145)
Sodium: 139 mmol/L (ref 135–145)

## 2020-12-19 LAB — HEPATIC FUNCTION PANEL
ALT: 274 U/L — ABNORMAL HIGH (ref 0–44)
AST: 662 U/L — ABNORMAL HIGH (ref 15–41)
Albumin: 2.2 g/dL — ABNORMAL LOW (ref 3.5–5.0)
Alkaline Phosphatase: 495 U/L — ABNORMAL HIGH (ref 38–126)
Bilirubin, Direct: 2.3 mg/dL — ABNORMAL HIGH (ref 0.0–0.2)
Indirect Bilirubin: 1.3 mg/dL — ABNORMAL HIGH (ref 0.3–0.9)
Total Bilirubin: 3.6 mg/dL — ABNORMAL HIGH (ref 0.3–1.2)
Total Protein: 4.1 g/dL — ABNORMAL LOW (ref 6.5–8.1)

## 2020-12-19 LAB — FERRITIN: Ferritin: >7500 ng/mL — ABNORMAL HIGH (ref 11–307)

## 2020-12-19 LAB — POCT I-STAT 7, (LYTES, BLD GAS, ICA,H+H)
Acid-base deficit: 7 mmol/L — ABNORMAL HIGH (ref 0.0–2.0)
Bicarbonate: 17.2 mmol/L — ABNORMAL LOW (ref 20.0–28.0)
Calcium, Ion: 0.93 mmol/L — ABNORMAL LOW (ref 1.15–1.40)
HCT: 30 % — ABNORMAL LOW (ref 36.0–46.0)
Hemoglobin: 10.2 g/dL — ABNORMAL LOW (ref 12.0–15.0)
O2 Saturation: 99 %
Patient temperature: 96.5
Potassium: 5.4 mmol/L — ABNORMAL HIGH (ref 3.5–5.1)
Sodium: 137 mmol/L (ref 135–145)
TCO2: 18 mmol/L — ABNORMAL LOW (ref 22–32)
pCO2 arterial: 28.4 mmHg — ABNORMAL LOW (ref 32.0–48.0)
pH, Arterial: 7.386 (ref 7.350–7.450)
pO2, Arterial: 112 mmHg — ABNORMAL HIGH (ref 83.0–108.0)

## 2020-12-19 LAB — MAGNESIUM: Magnesium: 2.1 mg/dL (ref 1.7–2.4)

## 2020-12-19 LAB — TSH: TSH: 0.759 u[IU]/mL (ref 0.350–4.500)

## 2020-12-19 LAB — C-REACTIVE PROTEIN: CRP: 18.3 mg/dL — ABNORMAL HIGH (ref <1.0)

## 2020-12-19 LAB — LACTIC ACID, PLASMA: Lactic Acid, Venous: >11 mmol/L (ref 0.5–1.9)

## 2020-12-19 LAB — GLUCOSE, CAPILLARY: Glucose-Capillary: 72 mg/dL (ref 70–99)

## 2020-12-19 MED ORDER — EPINEPHRINE 1 MG/10ML IJ SOSY
PREFILLED_SYRINGE | INTRAMUSCULAR | Status: AC
  Filled 2020-12-19: qty 40

## 2020-12-19 MED ORDER — SODIUM CHLORIDE 0.9 % IV SOLN
1.0000 g | Freq: Two times a day (BID) | INTRAVENOUS | Status: DC
  Filled 2020-12-19 (×2): qty 1

## 2020-12-19 MED ORDER — PRISMASOL BGK 0/2.5 32-2.5 MEQ/L EC SOLN
Status: DC
  Filled 2020-12-19 (×7): qty 5000

## 2020-12-19 MED ORDER — ROCURONIUM BROMIDE 10 MG/ML (PF) SYRINGE
PREFILLED_SYRINGE | INTRAVENOUS | Status: AC
  Filled 2020-12-19: qty 10

## 2020-12-19 MED ORDER — CALCIUM GLUCONATE-NACL 1-0.675 GM/50ML-% IV SOLN
1.0000 g | Freq: Once | INTRAVENOUS | Status: AC
  Administered 2020-12-19: 1000 mg via INTRAVENOUS
  Filled 2020-12-19: qty 50

## 2020-12-19 MED ORDER — VASOPRESSIN 20 UNITS/100 ML INFUSION FOR SHOCK
0.0000 [IU]/min | INTRAVENOUS | Status: DC
  Administered 2020-12-19: 0.03 [IU]/min via INTRAVENOUS
  Filled 2020-12-19: qty 100

## 2020-12-19 MED ORDER — PRISMASOL BGK 0/2.5 32-2.5 MEQ/L EC SOLN
Status: DC
  Filled 2020-12-19 (×2): qty 5000

## 2020-12-19 MED ORDER — IOHEXOL 300 MG/ML  SOLN: 100.0000 mL | Freq: Once | INTRAMUSCULAR | Status: DC | PRN

## 2020-12-19 MED ORDER — EPINEPHRINE HCL 5 MG/250ML IV SOLN IN NS
INTRAVENOUS | Status: AC
  Administered 2020-12-19: 04:00:00 2 ug/min via INTRAVENOUS
  Filled 2020-12-19: qty 250

## 2020-12-19 MED ORDER — SODIUM BICARBONATE 8.4 % IV SOLN
INTRAVENOUS | Status: AC
  Filled 2020-12-19: qty 50

## 2020-12-19 MED ORDER — HYDROCORTISONE NA SUCCINATE PF 100 MG IJ SOLR
100.0000 mg | Freq: Once | INTRAMUSCULAR | Status: AC
  Administered 2020-12-19: 100 mg via INTRAVENOUS
  Filled 2020-12-19: qty 2

## 2020-12-19 MED ORDER — SODIUM CHLORIDE 0.9 % IV BOLUS
250.0000 mL | Freq: Once | INTRAVENOUS | Status: AC
  Administered 2020-12-19: 250 mL via INTRAVENOUS

## 2020-12-19 MED ORDER — EPINEPHRINE HCL 5 MG/250ML IV SOLN IN NS: 0.5000 ug/min | INTRAVENOUS | Status: DC

## 2020-12-19 MED ORDER — IOHEXOL 300 MG/ML  SOLN
100.0000 mL | Freq: Once | INTRAMUSCULAR | Status: AC | PRN
  Administered 2020-12-19: 100 mL via INTRAVENOUS

## 2020-12-19 MED ORDER — HYDROCORTISONE NA SUCCINATE PF 100 MG IJ SOLR: 50.0000 mg | Freq: Four times a day (QID) | INTRAMUSCULAR | Status: DC

## 2020-12-19 MED ORDER — SODIUM BICARBONATE 8.4 % IV SOLN
INTRAVENOUS | Status: AC
  Administered 2020-12-19: 50 meq
  Filled 2020-12-19: qty 50

## 2020-12-20 LAB — CULTURE, BLOOD (ROUTINE X 2)

## 2020-12-20 MED FILL — Medication: Qty: 1 | Status: AC

## 2020-12-20 NOTE — Progress Notes (Signed)
RTs attempted A Line x3 on left radial unsuccessfully. RT notified RN.

## 2020-12-20 NOTE — Progress Notes (Signed)
[facility] Summary Report / Loop Recorder 

## 2020-12-20 NOTE — Progress Notes (Addendum)
eLink Physician-Brief Progress Note Patient Name: SHAQUANNA POPOVICH DOB: 09-Apr-1976 MRN: [id number]   Date of Service  Dec 20, 2020  HPI/Events of Note  Hypotension - BP = 49/37 with MAP = 43 on CRRT (no pull) and Norepinephrine IV infusion at 40 mcg/min.   eICU Interventions  Plan: 1. Increase ceiling on Norepinephrine IV infusion to 60 mcg/min. 2. Bolus with 0.9 NaCl 250 mL IV over 15 minutes. 3. ABG STAT. 4. PCCM ground team notified of more acute need for A-line. 5. Vasopressin IV infusion at shock dose.      Intervention Category Major Interventions: Hypotension - evaluation and management  Kaniesha Barile Eugene Dec 20, 2020, 2:58 AM

## 2020-12-20 NOTE — Significant Event (Addendum)
OVERNIGHT COVERAGE CRITICAL CARE PROGRESS NOTE  CTSP re: CODE BLUE.  PEA reported.  On arrival, CPR in progress.  Despite 45 min of CPR per ACLS protocol.  See CODE BLUE documentation for specifics.  In brief, CPR was initiated for PEA arrest.  Chest compressions were started immediately by ICU staff.  Numerous amps of epinephrine were administered per ACLS protocol.  Shocks were also delivered based on monitor rhythm analysis.  ROSC was not achieved.  CPR efforts were also complicated by sudden development of subcutaneous emphysema.  Bedside ultrasound examination did not, however, reveal significant pneumothorax.  The rapid progression of subcutaneous emphysema included involvement of head and neck and contributed to difficult intubation.  CPR was ordered to be stopped by me on the basis of futility of care after failure to achieve ROSC with 45 min of CPR.  Asystole on the monitor. On exam, no response to noxious, verbal or physical stimuli.  Absent heart and breath sounds. Absent peripheral pulses.  Pupils are fixed and dilated.  Patient pronounced dead at 06:14 on 12/27/2020. Next of kin/family (cousin) notified.  Case reviewed with medical examiner Eddie Dibbles McNabb).  Case was declined for examination.  Renee Pain, MD Board Certified by the ABIM, [place]

## 2020-12-20 NOTE — Anesthesia Procedure Notes (Signed)
Procedure Name: Intubation Date/Time: 01/04/2021 6:02 AM Performed by: Tanikka Bresnan T, CRNA Pre-anesthesia Checklist: Patient identified, Emergency Drugs available, Suction available and Patient being monitored Oxygen Delivery Method: Ambu bag Preoxygenation: Pre-oxygenation with 100% oxygen Induction Type: Cricoid Pressure applied Ventilation: Mask ventilation without difficulty Laryngoscope Size: Glidescope and 3 Grade View: Grade III Tube type: Oral Tube size: 7.0 mm Number of attempts: 5 or more Airway Equipment and Method: Stylet and Video-laryngoscopy Placement Confirmation: ETT inserted through vocal cords under direct vision,  CO2 detector and positive ETCO2 Secured at: 23 cm Dental Injury: Bloody posterior oropharynx

## 2020-12-20 NOTE — Progress Notes (Addendum)
CRITICAL VALUE ALERT  Critical Value:  Potissium 6.4  Date & Time Notied:  12/29/2020  0020   Provider Notified: Harrie Foreman, MD  Orders Received/Actions taken: Verbal order to change pre-filter, post-filter and dialysis solution to 2/2.5.

## 2020-12-20 NOTE — Progress Notes (Signed)
CRITICAL VALUE ALERT  Critical Value: Lactic acid  >11  Date & Time Notied:  01-03-21   0431  Provider Notified: Warren Lacy  Orders Received/Actions taken:

## 2020-12-20 NOTE — Progress Notes (Incomplete)
   01/14/21 0500  Clinical Encounter Type  Visited With Patient not available  Visit Type Code  Referral From Nurse  Consult/Referral To Chaplain  The chaplain responded to code blue. The medical team is responding. There is no family present. No chaplain services needed at this time. The chaplain will follow up and respond as needed.

## 2020-12-20 NOTE — Progress Notes (Signed)
IVT: Code blue response- pt with access.

## 2020-12-20 NOTE — Progress Notes (Signed)
Pharmacy Antibiotic Note  [name] is a 45 y.o. female with ESRD on HD admitted on 12/08/2020 with bacteremia and worsening septic shock, now on CRRT.  Pharmacy has been consulted for Meropenem dosing.    Plan: Meropenem 1 g IV q12h  Height: '5\' 4"'$  (162.6 cm) Weight: 59 kg (130 lb) IBW/kg (Calculated) : 54.7  Temp (24hrs), Avg:97.1 F (36.2 C), Min:96.3 F (35.7 C), Max:98 F (36.7 C)  Recent Labs  Lab 12/15/20 0747 12/15/20 0857 12/10/2020 1034 12/18/20 0616 12/18/20 2353 01/12/21 0310 01/12/21 0311 January 12, 2021 0353  WBC 18.8*  --  20.0* 20.4*  --  12.3*  --   --   CREATININE 7.70*  --  6.51* 7.75* 6.75*  --  4.40*  --   LATICACIDVEN  --  2.8*  --  2.0*  --   --   --  >11.0*    Estimated Creatinine Clearance: 14.1 mL/min (A) (by C-G formula based on SCr of 4.4 mg/dL (H)).    Allergies  Allergen Reactions  . Amlodipine Hives and Swelling  . Sulfa Antibiotics Hives and Itching  . Vancomycin Hives and Itching  . Venlafaxine Other (See Comments)    Emotional   Caryl Pina Jan 12, 2021 4:48 AM

## 2020-12-20 NOTE — Procedures (Signed)
Arterial Catheter Insertion Procedure Note  [name]  [id number]  10/06/1976  Date:01-02-2021  Time:4:32 AM    Provider Performing: Otilio Carpen Camdon Saetern    Procedure: Insertion of Arterial Line [phone]) with US guidance [id number])   Indication(s) Blood pressure monitoring and/or need for frequent ABGs  Consent Unable to obtain consent due to emergent nature of procedure.  Anesthesia None   Time Out Verified patient identification, verified procedure, site/side was marked, verified correct patient position, special equipment/implants available, medications/allergies/relevant history reviewed, required imaging and test results available.   Sterile Technique Maximal sterile technique including full sterile barrier drape, hand hygiene, sterile gown, sterile gloves, mask, hair covering, sterile ultrasound probe cover (if used).   Procedure Description Area of catheter insertion was cleaned with chlorhexidine and draped in sterile fashion. With real-time ultrasound guidance an arterial catheter was placed into the left radial artery.  Appropriate arterial tracings confirmed on monitor.     Complications/Tolerance None; patient tolerated the procedure well.   EBL Minimal   Specimen(s) None  Otilio Carpen Mary Steger, PA-C

## 2020-12-20 NOTE — Death Summary Note (Addendum)
DEATH SUMMARY   Patient Details  Name: [name] MRN: [id number] DOB: Apr 16, 1976  Admission/Discharge Information   Admit Date:  12-18-2020  Date of Death:   12/20/2020  Time of Death:  06:14  Length of Stay: 1  Referring Physician: Leeanne Rio, MD   Reason(s) for Hospitalization  Septic Shock  Diagnoses  Preliminary cause of death:  Secondary Diagnoses (including complications and co-morbidities):  Principal Problem:   Septic shock ([place]) Active Problems:   End stage renal disease on dialysis ([place])   PEA (Pulseless electrical activity) ([place])   Enterococcal bacteremia   Pressure injury of skin   COVID   SLE (systemic lupus erythematosus related syndrome) ([place])   Immunocompromised state ([place])   Acute hepatitis   Brief Hospital Course (including significant findings, care, treatment, and services provided and events leading to death)  This 45 year old female with past medical history of ESRD on dialysis transferred from [facility] ([place] admission date: 12/18/20) on 1/30 for septic shock and need of renal replacement therapy.  She was incidentally found to be COVID-19 positive but without infiltrate on chest x-ray.  She was started on daptomycin for gram-positive cocci in 1 out of 2 blood cultures, which ultimately isolated Enterococcus.  Overnight, on 1/30 her blood pressure continued to drop.  CRRT was held, repeat labs were drawn which showed increasing lactic acidosis.  She was started on vasopressin and epinephrine drips in addition to Levophed.  Meropenem was also added for gram-negative coverage.  She was given bicarb, stress dose steroids and calcium.  She was mentating throughout the night and complained of lower abdominal pain and back pain.  CT abdomen/pelvis was obtained but was unrevealing.  The patient went into PEA arrest at 5:18 AM.  CPR was initiated per ACLS protocol by the ICU staff.  Despite 46 minutes of CPR efforts, ROSC could not be  achieved.  CPR efforts were complicated by development of subcutaneous emphysema.  This led to difficulty during intubation, which was ultimately achieved by the CRNA.  ACLS was sustained for over 45 minutes with numerous epinephrine pushes, bicarb, calcium, magnesium, glucose given.  ROSC was briefly obtained 3 times.  The predominant rhythm throughout ACLS was asystole.  However, she did appear to develop fine ventricular fibrillation on 3 different occasions, prompting defibrillation.  Ultimately, no sustained, organized rhythm could be sustained.  CPR efforts were ordered to be stopped at 06:14.  Case was reviewed with the medical examiner Donata Duff), who declined the examination.  Pertinent Labs and Studies  Significant Diagnostic Studies DG Chest 2 View  Result Date: 12/08/2020 CLINICAL DATA:  Weakness.  AV fistula placement yesterday. EXAM: CHEST - 2 VIEW COMPARISON:  09/22/2020 FINDINGS: Right jugular dual lumen catheter extends into the right atrium unchanged in position. Negative for edema. Bibasilar atelectasis/scarring is unchanged. Possible small pleural effusions bilaterally. Cardiac loop recorder noted.  Stent in the left upper arm. IMPRESSION: Chronic scar/atelectasis in the bases.  No acute abnormality. Electronically Signed   By: Franchot Gallo M.D.   On: 12/08/2020 13:28   CT ABDOMEN PELVIS W CONTRAST  Result Date: 12/20/20 CLINICAL DATA:  Abdominal pain acute. Periumbilical and right lower quadrant. Rule out appendicitis. EXAM: CT ABDOMEN AND PELVIS WITH CONTRAST TECHNIQUE: Multidetector CT imaging of the abdomen and pelvis was performed using the standard protocol following bolus administration of intravenous contrast. CONTRAST:  140m OMNIPAQUE IOHEXOL 300 MG/ML  SOLN COMPARISON:  CT abdomen pelvis 06/08/2020. FINDINGS: Limited evaluation due to streak  artifact originating from bilateral upper extremities. Lower chest: Bilateral lower lobe subsegmental atelectasis and scarring.  Moderate severe mitral annular calcifications. Central venous catheter terminates within the right atrium. Hepatobiliary: No focal liver abnormality. Hyperdensity of the gallbladder lumen with no definite calcified gallstone. No biliary dilatation. Pancreas: Diffusely atrophic. No focal lesion. Otherwise normal pancreatic contour. No surrounding inflammatory changes. No main pancreatic ductal dilatation. Spleen: Normal in size without focal abnormality. Suggestion of a splenule (3:21). Adrenals/Urinary Tract: No adrenal nodule bilaterally. Marked bilateral renal cortical thinning and atrophy with several subcentimeter hypodensities both intraparenchymal and exophytic. Bilateral kidneys enhance symmetrically. No hydronephrosis. No hydroureter. The urinary bladder is unremarkable. Stomach/Bowel: PO contrast reaches the distal ileum. Stomach is within normal limits. No evidence of bowel wall thickening or dilatation. Scattered colonic diverticulosis. Appendix appears normal. Vascular/Lymphatic: No abdominal aorta or iliac aneurysm. Mild atherosclerotic plaque of the aorta and its branches. No abdominal, pelvic, or inguinal lymphadenopathy. Reproductive: Uterus and bilateral adnexa are unremarkable. Other: Trace perihepatic free fluid. Trace to small volume simple free fluid within the pelvis. Calcifications inferior to the right kidney of unclear etiology. Musculoskeletal: Bilateral fat containing, left greater than right, inguinal hernias. Subcutaneus soft tissue edema. No suspicious lytic or blastic osseous lesions. No acute displaced fracture. IMPRESSION: 1. Hyperdensity of the gallbladder lumen with no definite calcified gallstone. Consider right upper quadrant ultrasound for further evaluation. 2. Trace to small volume simple fluid ascites. 3. Normal appendix. 4. Other imaging findings of potential clinical significance: Scattered colonic diverticulosis with no acute diverticulitis. Bilateral fat containing  inguinal hernias-no findings suggest associated bowel obstruction or ischemia. Bilateral atrophic in scarred kidneys with multiple too small to characterize hypodensities. Mitral annular calcifications-moderate. Aortic Atherosclerosis (ICD10-I70.0). Electronically Signed   By: Iven Finn M.D.   On: 01/10/2021 05:57   DG Chest Portable 1 View  Result Date: 11/22/2020 CLINICAL DATA:  Weakness.  Hypotension.  Unable to go to dialysis. EXAM: PORTABLE CHEST 1 VIEW COMPARISON:  12/15/2020 FINDINGS: Right jugular double-lumen catheter tips in the right atrium, unchanged. Normal sized heart. Mildly increased bibasilar atelectasis. Stable left upper extremity pain dialysis fistula. Mild scoliosis. 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 IMPRESSION: Mildly increased bibasilar atelectasis. Electronically Signed   By: Claudie Revering M.D.   On: 12/16/2020 10:16   DG Chest Port 1 View  Result Date: 12/15/2020 CLINICAL DATA:  Renal failure. Hypertension. Systemic lupus erythematosus EXAM: PORTABLE CHEST 1 VIEW COMPARISON:  December 08, 2020 FINDINGS: There is scarring in the lung bases bilaterally. Lungs elsewhere are clear. Heart size and pulmonary vascularity are normal. Central catheter tip is in the right atrium, unchanged. There is a loop recorder on the left. A stent is noted in the left brachial region. There is sclerosis in each humeral head, likely due to bilateral avascular necrosis in these areas. No adenopathy evident. IMPRESSION: Scarring in the lung bases. No edema or airspace opacity. Stable cardiac silhouette. Central catheter tip in right atrium. Apparent avascular necrosis in each humeral head. Electronically Signed   By: Lowella Grip III M.D.   On: 12/15/2020 11:05   VAS US DUPLEX DIALYSIS ACCESS (AVF, AVG)  Result Date: 11/29/2020 DIALYSIS ACCESS Reason for Exam: Routine follow up. Access Site: Right Upper Extremity. Access Type: Basilic vein transposition. History:  07/01/20 Right AVF surgery. Fistulogram on 10/27/20. Comparison Study: 09/09/20 Performing Technologist: June Leap RDMS, RVT  Examination Guidelines: A complete evaluation includes B-mode imaging, spectral Doppler, color Doppler, and power Doppler as needed of all accessible portions of  each vessel. Unilateral testing is considered an integral part of a complete examination. Limited examinations for reoccurring indications may be performed as noted.  Findings: +--------------------+----------+-----------------+--------+ AVF                 PSV (cm/s)Flow Vol (mL/min)Comments +--------------------+----------+-----------------+--------+ Native artery inflow    98           143                +--------------------+----------+-----------------+--------+ AVF Anastomosis        152                              +--------------------+----------+-----------------+--------+  +------------+----------+-------------+----------+-----------------------------+ OUTFLOW VEINPSV (cm/s)Diameter (cm)Depth (cm)          Describe            +------------+----------+-------------+----------+-----------------------------+ Shoulder        90        0.56        1.02                                 +------------+----------+-------------+----------+-----------------------------+ Prox UA        175        0.29        0.20                                 +------------+----------+-------------+----------+-----------------------------+ Mid UA         553        0.23        0.60    Adjacent area of hypoechoic                                                  echoes. Possibly small                                                  hematoma 0.83 cm and stenotic +------------+----------+-------------+----------+-----------------------------+ AC Fossa       523        0.22        0.53    Retained valve and stenotic  +------------+----------+-------------+----------+-----------------------------+    Summary: Patent arteriovenous fistula. Arteriovenous fistula-Stenosis noted. *See table(s) above for measurements and observations.  Diagnosing physician: Monica Martinez MD Electronically signed by Monica Martinez MD on 11/29/2020 at 5:26:31 PM.    --------------------------------------------------------------------------------   Final    CUP PACEART REMOTE DEVICE CHECK  Result Date: 12/07/2020 ILR summary report received. Battery status OK. Normal device function. No new symptom, tachy, brady, or pause episodes. No new AF episodes. Monthly summary reports and ROV/PRN. HB  US Abdomen Limited RUQ (LIVER/GB)  Result Date: 12/18/2020 CLINICAL DATA:  Transaminitis EXAM: ULTRASOUND ABDOMEN LIMITED RIGHT UPPER QUADRANT COMPARISON:  06/08/2011 FINDINGS: Gallbladder: Collapsed gallbladder which accounts for wall thickening. No gallstone or cholecystitis. Common bile duct: Diameter: 2 mm. Liver: No focal lesion identified. Within normal limits in parenchymal echogenicity. Mild surface lobulation best seen adjacent to areas of ascites. Portal vein is patent on color Doppler imaging with normal direction of  blood flow towards the liver. Other: Small volume ascites in the right upper quadrant. IMPRESSION: 1. Concern for cirrhosis. There is small volume ascites in the right upper quadrant. 2. Negative gallbladder. Electronically Signed   By: Monte Fantasia M.D.   On: 12/18/2020 10:20    Microbiology Recent Results (from the past 240 hour(s))  C. Diff by PCR     Status: Abnormal   Collection Time: 12/09/20  2:26 PM   Specimen: STOOL  Result Value Ref Range Status   Toxigenic C. Difficile by PCR POSITIVE (A) NEGATIVE Final    Comment: Positive for toxigenic C. difficile with little to no toxin production. Only treat if clinical presentation suggests symptomatic illness. Performed at [facility], [place] [address], [place], [place] [address]   SARS Coronavirus 2 by RT PCR (hospital order, performed in  [facility] hospital lab) Nasopharyngeal Nasopharyngeal Swab     Status: None   Collection Time: 12/15/20 10:09 AM   Specimen: Nasopharyngeal Swab  Result Value Ref Range Status   SARS Coronavirus 2 NEGATIVE NEGATIVE Final    Comment: (NOTE) SARS-CoV-2 target nucleic acids are NOT DETECTED.  The SARS-CoV-2 RNA is generally detectable in upper and lower respiratory specimens during the acute phase of infection. The lowest concentration of SARS-CoV-2 viral copies this assay can detect is 250 copies / mL. A negative result does not preclude SARS-CoV-2 infection and should not be used as the sole basis for treatment or other patient management decisions.  A negative result may occur with improper specimen collection / handling, submission of specimen other than nasopharyngeal swab, presence of viral mutation(s) within the areas targeted by this assay, and inadequate number of viral copies (<250 copies / mL). A negative result must be combined with clinical observations, patient history, and epidemiological information.  Fact Sheet for Patients:   StrictlyIdeas.no  Fact Sheet for Healthcare Providers: BankingDealers.co.za  This test is not yet approved or  cleared by the [place] FDA and has been authorized for detection and/or diagnosis of SARS-CoV-2 by FDA under an Emergency Use Authorization (EUA).  This EUA will remain in effect (meaning this test can be used) for the duration of the COVID-19 declaration under Section 564(b)(1) of the Act, 21 U.S.C. section 360bbb-3(b)(1), unless the authorization is terminated or revoked sooner.  Performed at [facility], [place] [address] [place], [place] [address]   SARS CORONAVIRUS 2 (TAT 6-24 HRS) Nasopharyngeal Nasopharyngeal Swab     Status: Abnormal   Collection Time: 11/27/2020  3:39 PM   Specimen: Nasopharyngeal Swab  Result Value Ref Range Status   SARS Coronavirus 2 POSITIVE (A)  NEGATIVE Final    Comment: (NOTE) SARS-CoV-2 target nucleic acids are DETECTED.  The SARS-CoV-2 RNA is generally detectable in upper and lower respiratory specimens during the acute phase of infection. Positive results are indicative of the presence of SARS-CoV-2 RNA. Clinical correlation with patient history and other diagnostic information is  necessary to determine patient infection status. Positive results do not rule out bacterial infection or co-infection with other viruses.  The expected result is Negative.  Fact Sheet for Patients: SugarRoll.be  Fact Sheet for Healthcare Providers: https://www.woods-mathews.com/  This test is not yet approved or cleared by the [place] FDA and  has been authorized for detection and/or diagnosis of SARS-CoV-2 by FDA under an Emergency Use Authorization (EUA). This EUA will remain  in effect (meaning this test can be used) for the duration of the COVID-19 declaration under Section  564(b)(1) of the Act, 21 U. S.C. section 360bbb-3(b)(1), unless the authorization is terminated or revoked sooner.   Performed at [facility], [place] [address], [place], [place] [address]   Culture, blood (routine x 2)     Status: None (Preliminary result)   Collection Time: 12/12/2020  6:13 PM   Specimen: BLOOD  Result Value Ref Range Status   Specimen Description   Final    BLOOD BOTTLES DRAWN AEROBIC AND ANAEROBIC Blood Culture adequate volume A-LINE DRAW Performed at [facility], [address] [place], [place] [address]    Special Requests   Final    NONE Performed at [facility], [address] [place], [place] [address]    Culture  Setup Time   Final    AEROBIC BOTTLE GRAM POSITIVE COCCI Gram Stain Report Called to,Read Back By and Verified With: [id number] BY MATTHEWS, B 1.30.22 Organism ID to follow CRITICAL RESULT CALLED TO, READ BACK BY AND VERIFIED WITH: PHARMD J CARNEY 12/18/20 AT 2150  SK  Performed at [facility], [place] [address] [place], [place] [address]    Culture GRAM POSITIVE COCCI  Final   Report Status PENDING  Incomplete  Blood Culture ID Panel (Reflexed)     Status: Abnormal   Collection Time: 12/12/2020  6:13 PM  Result Value Ref Range Status   Enterococcus faecalis DETECTED (A) NOT DETECTED Final    Comment: CRITICAL RESULT CALLED TO, READ BACK BY AND VERIFIED WITH: PHARMD J CARNEY 12/18/20 AT 2150 SK     Enterococcus Faecium NOT DETECTED NOT DETECTED Final   Listeria monocytogenes NOT DETECTED NOT DETECTED Final   Staphylococcus species NOT DETECTED NOT DETECTED Final   Staphylococcus aureus (BCID) NOT DETECTED NOT DETECTED Final   Staphylococcus epidermidis NOT DETECTED NOT DETECTED Final   Staphylococcus lugdunensis NOT DETECTED NOT DETECTED Final   Streptococcus species NOT DETECTED NOT DETECTED Final   Streptococcus agalactiae NOT DETECTED NOT DETECTED Final   Streptococcus pneumoniae NOT DETECTED NOT DETECTED Final   Streptococcus pyogenes NOT DETECTED NOT DETECTED Final   A.calcoaceticus-baumannii NOT DETECTED NOT DETECTED Final   Bacteroides fragilis NOT DETECTED NOT DETECTED Final   Enterobacterales NOT DETECTED NOT DETECTED Final   Enterobacter cloacae complex NOT DETECTED NOT DETECTED Final   Escherichia coli NOT DETECTED NOT DETECTED Final   Klebsiella aerogenes NOT DETECTED NOT DETECTED Final   Klebsiella oxytoca NOT DETECTED NOT DETECTED Final   Klebsiella pneumoniae NOT DETECTED NOT DETECTED Final   Proteus species NOT DETECTED NOT DETECTED Final   Salmonella species NOT DETECTED NOT DETECTED Final   Serratia marcescens NOT DETECTED NOT DETECTED Final   Haemophilus influenzae NOT DETECTED NOT DETECTED Final   Neisseria meningitidis NOT DETECTED NOT DETECTED Final   Pseudomonas aeruginosa NOT DETECTED NOT DETECTED Final   Stenotrophomonas maltophilia NOT DETECTED NOT DETECTED Final   Candida albicans NOT DETECTED NOT DETECTED  Final   Candida auris NOT DETECTED NOT DETECTED Final   Candida glabrata NOT DETECTED NOT DETECTED Final   Candida krusei NOT DETECTED NOT DETECTED Final   Candida parapsilosis NOT DETECTED NOT DETECTED Final   Candida tropicalis NOT DETECTED NOT DETECTED Final   Cryptococcus neoformans/gattii NOT DETECTED NOT DETECTED Final   Vancomycin resistance NOT DETECTED NOT DETECTED Final    Comment: Performed at [facility], [address], [place], [place] [address]  MRSA PCR Screening     Status: None   Collection Time: 12/18/20  5:24 PM  Specimen: Nasal Mucosa; Nasopharyngeal  Result Value Ref Range Status   MRSA by PCR NEGATIVE NEGATIVE Final    Comment:        The GeneXpert MRSA Assay (FDA approved for NASAL specimens only), is one component of a comprehensive MRSA colonization surveillance program. It is not intended to diagnose MRSA infection nor to guide or monitor treatment for MRSA infections. Performed at [facility], [place] [address] [place], [place] [address]     Lab Basic Metabolic Panel: Recent Labs  Lab 12/15/20 [phone] 12/18/2020 1034 12/18/20 0616 12/18/20 2353 01-15-2021 0310 01/15/2021 0311 01/15/2021 0350  NA 138 135 135 136  --  139 137  K 4.7 5.3* 5.5* 6.4*  --  4.8 5.4*  CL 99 99 99 101  --  106  --   CO2 20* 18* 20* 14*  --  12*  --   GLUCOSE 169* 128* 131* 141*  --  83  --   BUN 59* 51* 61* 55*  --  39*  --   CREATININE 7.70* 6.51* 7.75* 6.75*  --  4.40*  --   CALCIUM 7.3* 7.4* 8.0* 8.3*  --  7.0*  --   MG  --   --  2.4  --  2.1  --   --   PHOS  --   --   --  10.0*  --  7.0*  --    Liver Function Tests: Recent Labs  Lab 12/15/20 0747 12/12/2020 1034 12/18/20 0616 12/18/20 2353 01/15/2021 0310 01-15-2021 0311  AST 41 113* 297*  --  662*  --   ALT 26 53* 127*  --  274*  --   ALKPHOS 355* 385* 537*  --  495*  --   BILITOT 2.0* 2.8* 4.9*  --  3.6*  --   PROT 5.3* 5.8* 5.1*  --  4.1*  --   ALBUMIN 2.9* 3.1* 2.7* 2.6* 2.2* 2.2*   No results  for input(s): LIPASE, AMYLASE in the last 168 hours. No results for input(s): AMMONIA in the last 168 hours. CBC: Recent Labs  Lab 12/15/20 0747 12/05/2020 1034 12/18/20 0616 12/18/20 1724 January 15, 2021 0310 Jan 15, 2021 0350  WBC 18.8* 20.0* 20.4*  --  12.3*  --   NEUTROABS 14.9* 15.6*  --   --  10.5*  --   HGB 9.4* 10.1* 9.8*  --  8.4* 10.2*  HCT 30.5* 32.0* 31.0*  --  27.8* 30.0*  MCV 95.9 97.6 97.8  --  100.4*  --   PLT 86* 87* 80* 72* 51*  --    Cardiac Enzymes: No results for input(s): CKTOTAL, CKMB, CKMBINDEX, TROPONINI in the last 168 hours. Sepsis Labs: Recent Labs  Lab 12/15/20 0747 12/15/20 0857 12/16/2020 1034 11/27/2020 1813 12/18/20 0616 15-Jan-2021 0310 2021-01-15 0353  PROCALCITON  --   --   --  7.27  --   --   --   WBC 18.8*  --  20.0*  --  20.4* 12.3*  --   LATICACIDVEN  --  2.8*  --   --  2.0*  --  >11.0*    Procedures/Operations  1/29: Right femoral central venous catheter insertion 1/31: Left radial arterial cannulation 1/31: Endotracheal intubation  Renee Pain, MD Board Certified by the ABIM, [facility]   01/15/21, 7:09 AM

## 2020-12-20 NOTE — Progress Notes (Signed)
PCCM interval progress note:  Asked to the bedside to place a-line, pt has had rapidly increasing pressor needs overnight and still had MAP <65 on 26mg Levophed and Vaso.  She was sleepy, but arousable and protecting her airway, answering questions appropriately and complaining of back and abdominal pain.   Epinephrine, stress dose steroids, 1 amp bicarb and Meropenem initiated.  BP improved with this and reading higher on A-line than peripherally.  Lactic acid rising from 2.0 to >11.  P: -stat CT abd/pelvis -continue Vaso, Levo and Epi -Stress dose steroids -closely monitor mental status, at risk for intubation -Blood culture growing Enterococcus, on Daptomycin, add meropenem for gram negative coverage given rapidly worsening septic shock  LOtilio CarpenGleason, PA-C

## 2020-12-20 NOTE — Progress Notes (Signed)
eLink Physician-Brief Progress Note Patient Name: [name] DOB: January 02, 1976 MRN: [id number]   Date of Service  04-Jan-2021  HPI/Events of Note  Nursing request for A-line.Maturing AV fistula in R arm. Old, non-fuctional AV fistula in L arm.   eICU Interventions  Plan: 1. Place A-line L radial artery.      Intervention Category Major Interventions: Other:  [name] Cornelia Copa 01-04-2021, 1:29 AM

## 2020-12-20 DEATH — deceased

## 2021-01-30 ENCOUNTER — Other Ambulatory Visit: Payer: Medicare Other | Admitting: Adult Health

## 2021-02-20 ENCOUNTER — Inpatient Hospital Stay: Payer: Medicare Other | Admitting: Neurology

## 2021-05-14 IMAGING — DX DG CHEST 1V PORT
1 series · 1 of 1 positions shown · non-contrast
Comparison: 03/17/2016

CLINICAL DATA: Reason for exam: vascular dialysis catheter in place

EXAM:
PORTABLE CHEST 1 VIEW

[chest ap]
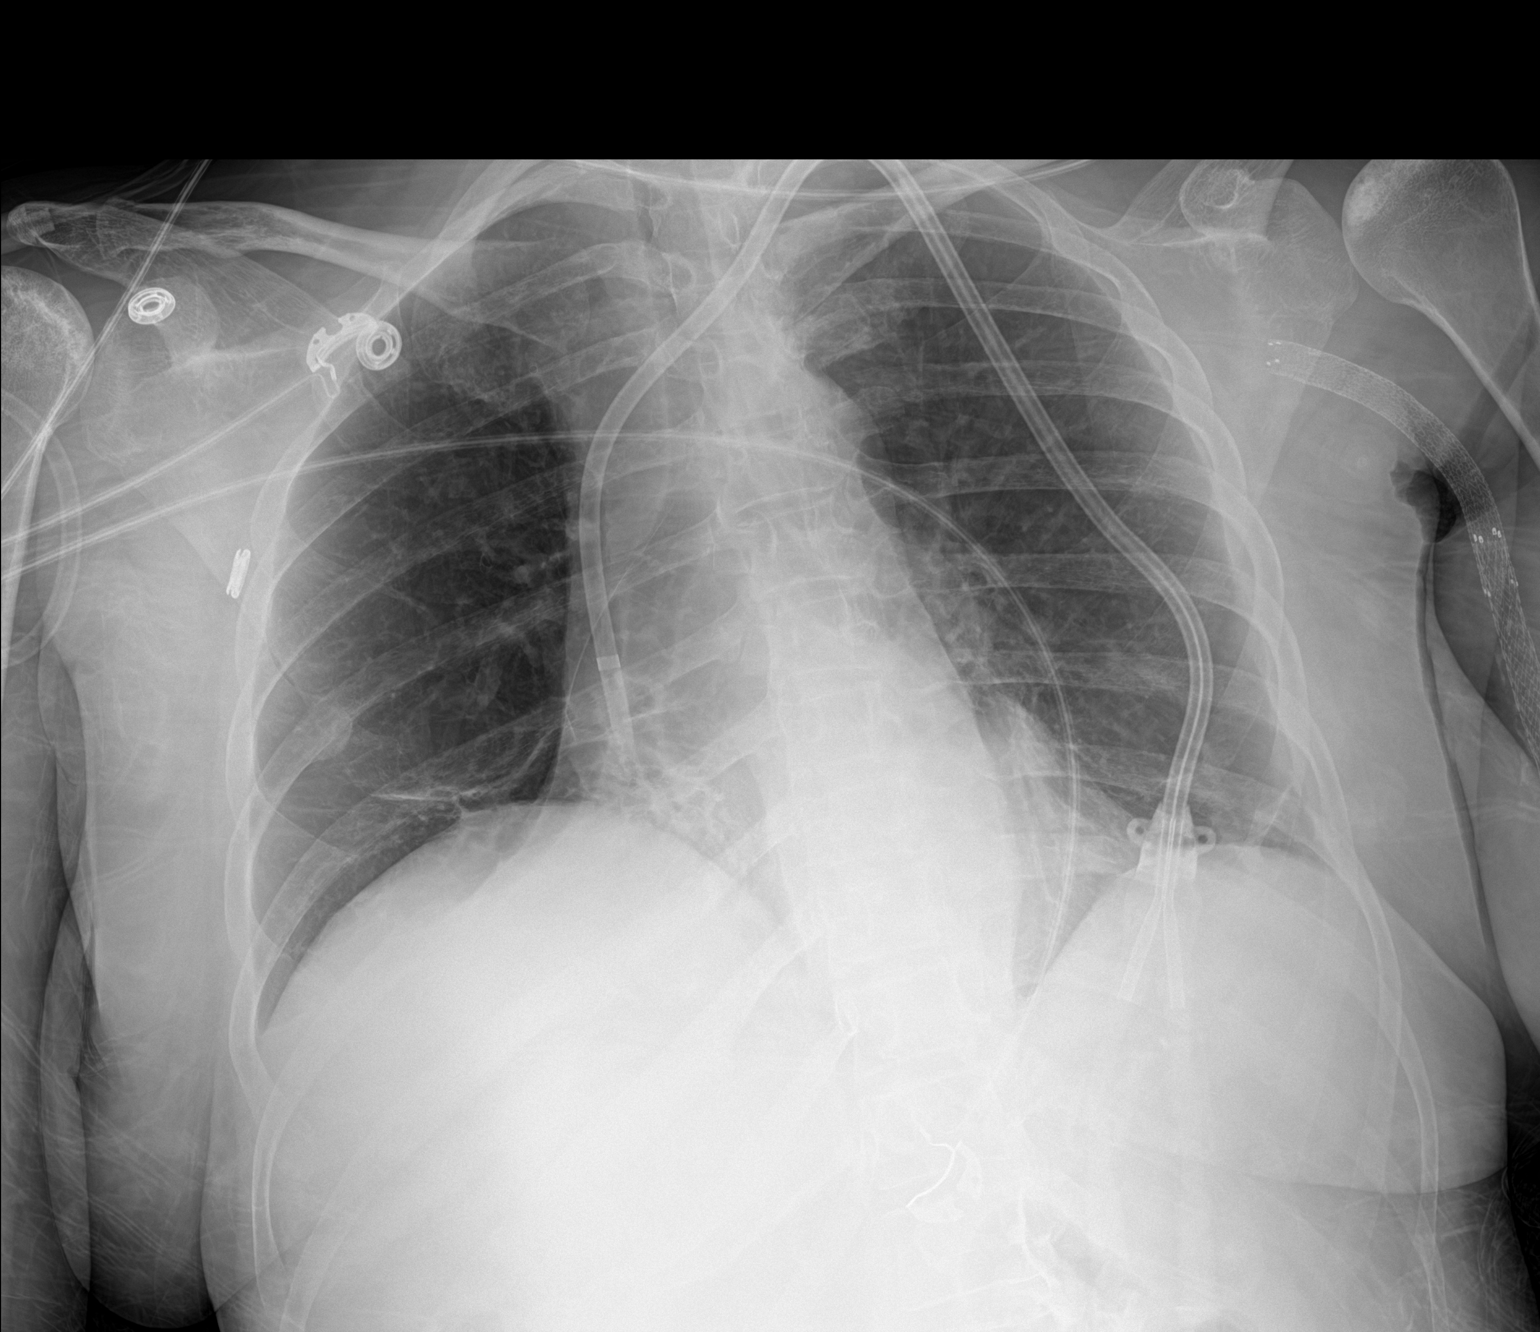

[1 of 1 positions shown; findings below may reference images not displayed]

FINDINGS: Interval placement of LEFT IJ dialysis catheter, tip overlying the
level of the UPPER RIGHT atrium. No pneumothorax. Heart size is
normal. Stable elevation of the RIGHT hemidiaphragm. There is patchy
opacity at the RIGHT lung base, consistent with atelectasis or early
infiltrate. No pulmonary edema. Remote rib fracture or fractures.
LEFT axillary stent.
IMPRESSION: 1. Interval the of LEFT IJ dialysis catheter. No pneumothorax.
2. Stable elevation of the RIGHT hemidiaphragm.
3. RIGHT lower lobe atelectasis or infiltrate.

## 2021-05-14 IMAGING — RF DG ANG/EXT/UNI/OR LEFT
1 series · 8 of 8 positions shown · non-contrast
Comparison: None.

CLINICAL DATA: Infiltrated left upper arm dialysis graft with
hematoma evacuation.

EXAM:
FIOL BABER/EXT/UNI/ OR

[Series 1: run · 2 acquisitions, 8 frames shown]
[im 1/2]
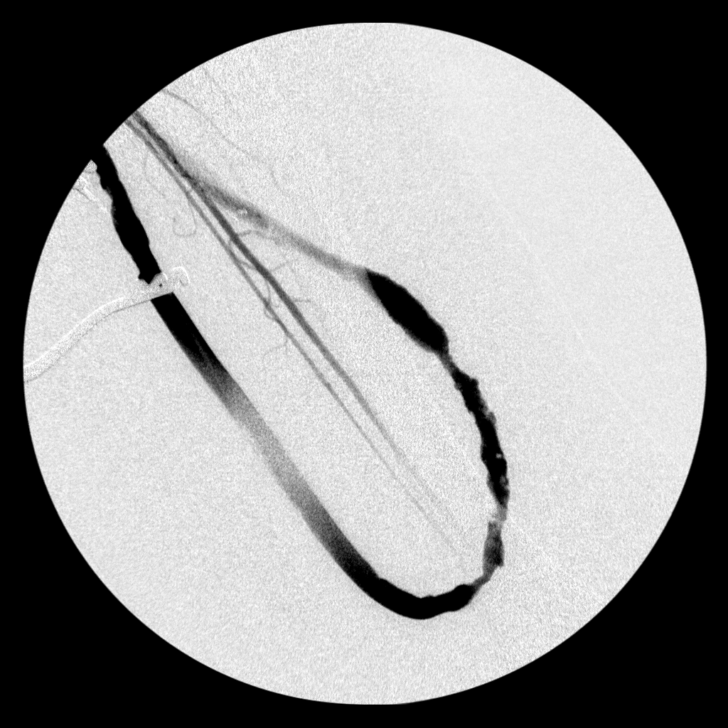
[im 1/2]
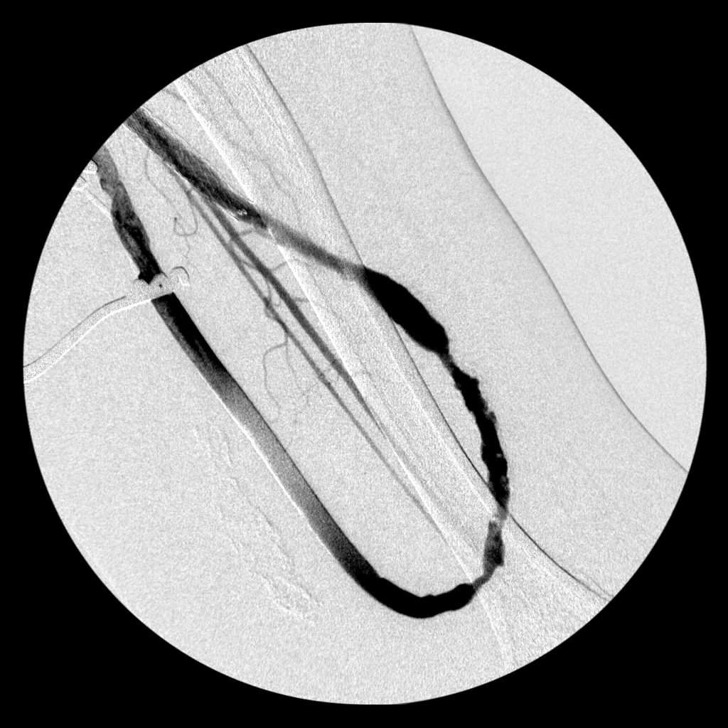
[im 1/2]
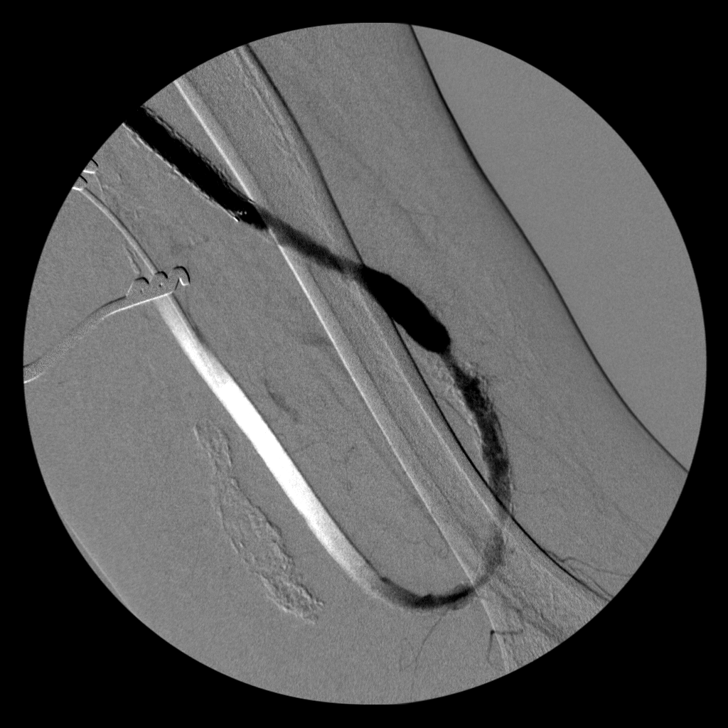
[im 1/2]
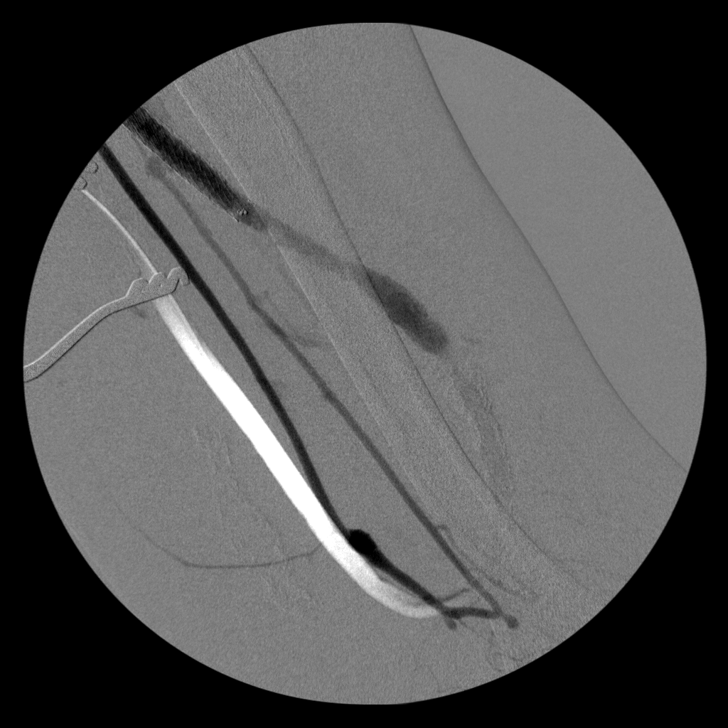
[im 2/2]
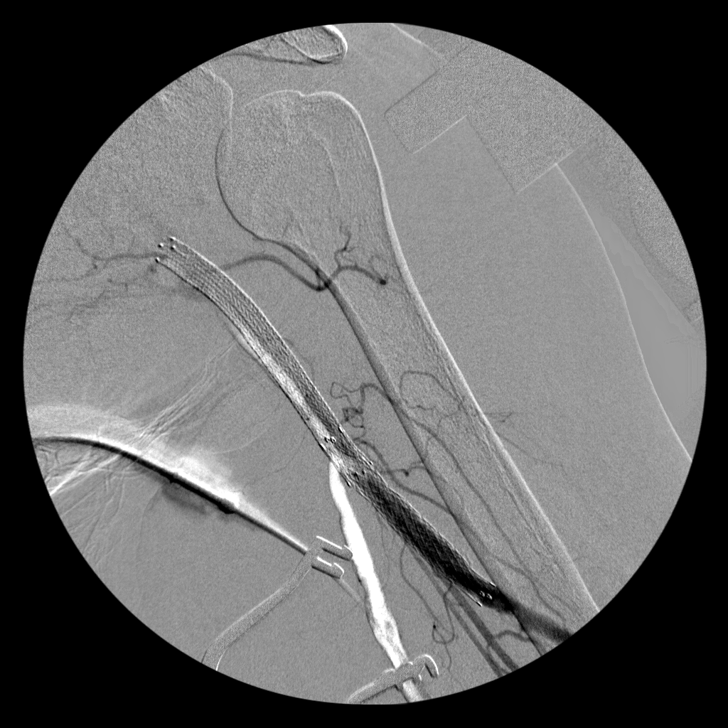
[im 2/2]
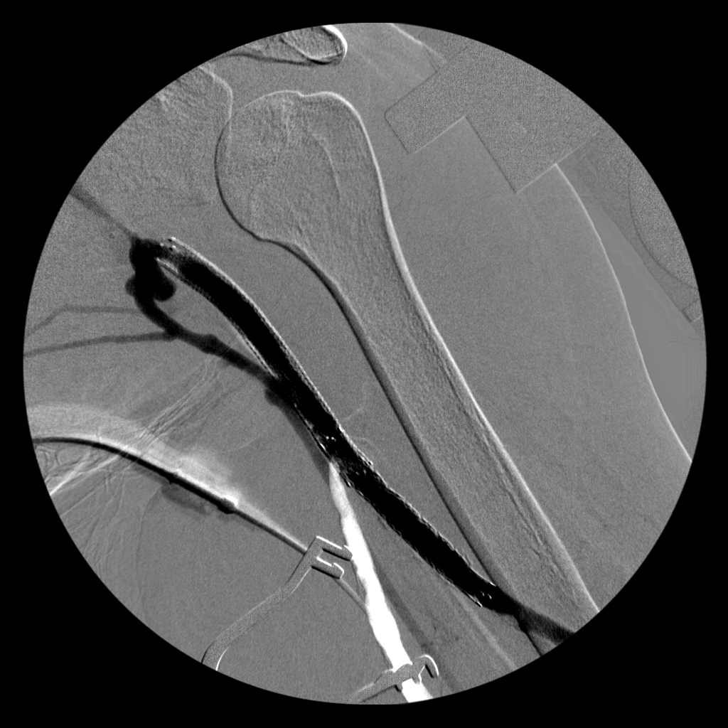
[im 2/2]
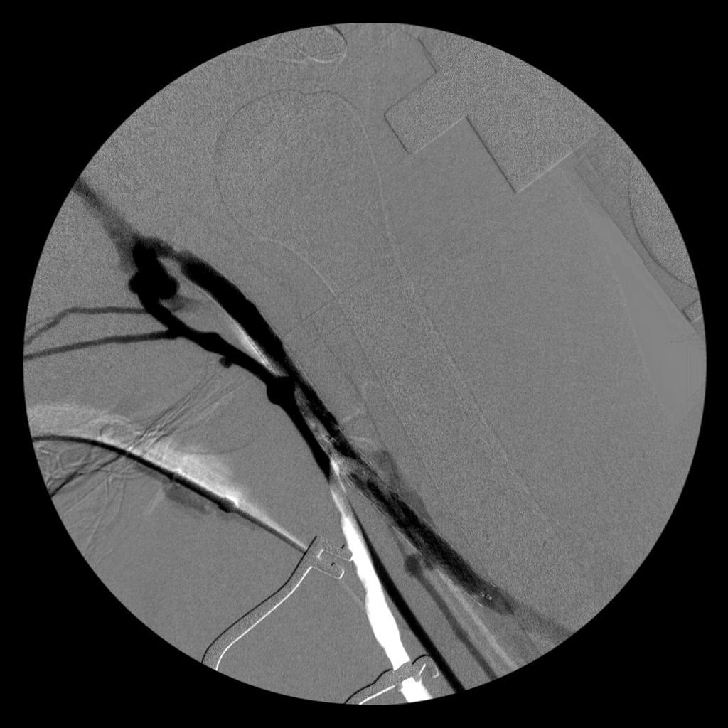
[im 2/2]
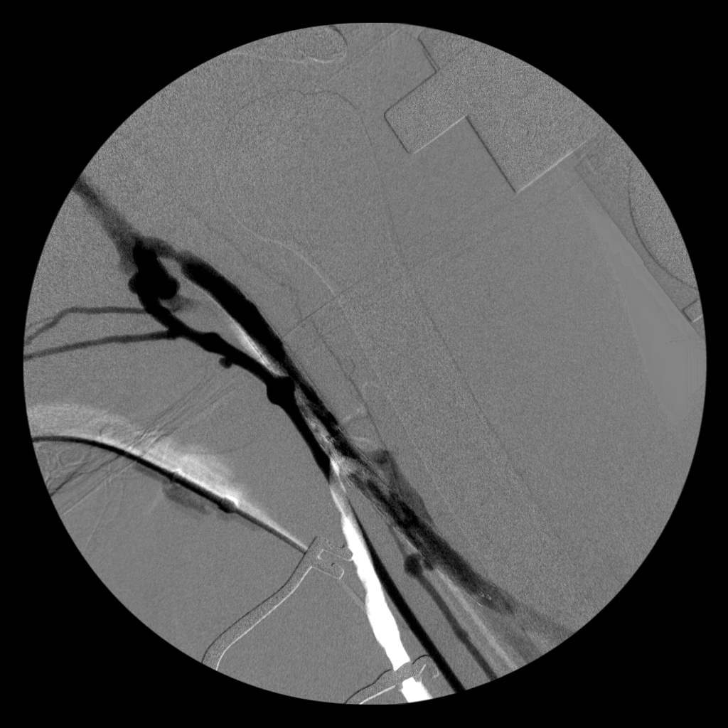

[8 of 8 positions shown; findings below may reference images not displayed]

FINDINGS: Intraoperative imaging shows opacification of a loop graft in the
left upper arm with contrast. Starting at the apex of the graft, the
venous limb is very deteriorated with multiple areas of irregular
narrowing present. No evidence of pseudoaneurysm or extravasation.
The venous anastomosis and proximal venous outflow is stented and
patent. At the level of the axilla there is reflux into a branch
vein and there may be some degree flow restriction at the level of
the axillary vein.
IMPRESSION: Degenerated loop graft with multiple areas of irregular stenosis
along the apex and venous limb. Patent stented venous anastomosis
and proximal venous outflow. Potential flow restriction at the level
of the axillary vein.
